# Patient Record
Sex: Female | Born: 1940 | Race: White | Hispanic: No | Marital: Married | Smoking: Never smoker
Health system: Southern US, Community
[De-identification: ages and names within clinical notes are randomized; demographics above are authoritative.]

## PROBLEM LIST (undated history)

## (undated) DIAGNOSIS — F329 Major depressive disorder, single episode, unspecified: Secondary | ICD-10-CM

## (undated) DIAGNOSIS — E119 Type 2 diabetes mellitus without complications: Secondary | ICD-10-CM

## (undated) DIAGNOSIS — Z9289 Personal history of other medical treatment: Secondary | ICD-10-CM

## (undated) DIAGNOSIS — J189 Pneumonia, unspecified organism: Secondary | ICD-10-CM

## (undated) DIAGNOSIS — I5042 Chronic combined systolic (congestive) and diastolic (congestive) heart failure: Secondary | ICD-10-CM

## (undated) DIAGNOSIS — I10 Essential (primary) hypertension: Secondary | ICD-10-CM

## (undated) DIAGNOSIS — F411 Generalized anxiety disorder: Secondary | ICD-10-CM

## (undated) DIAGNOSIS — M1990 Unspecified osteoarthritis, unspecified site: Secondary | ICD-10-CM

## (undated) DIAGNOSIS — B009 Herpesviral infection, unspecified: Secondary | ICD-10-CM

## (undated) DIAGNOSIS — M8580 Other specified disorders of bone density and structure, unspecified site: Secondary | ICD-10-CM

## (undated) DIAGNOSIS — G629 Polyneuropathy, unspecified: Secondary | ICD-10-CM

## (undated) DIAGNOSIS — R0600 Dyspnea, unspecified: Secondary | ICD-10-CM

## (undated) DIAGNOSIS — E669 Obesity, unspecified: Secondary | ICD-10-CM

## (undated) DIAGNOSIS — I491 Atrial premature depolarization: Secondary | ICD-10-CM

## (undated) DIAGNOSIS — J309 Allergic rhinitis, unspecified: Secondary | ICD-10-CM

## (undated) DIAGNOSIS — I420 Dilated cardiomyopathy: Secondary | ICD-10-CM

## (undated) DIAGNOSIS — Z9221 Personal history of antineoplastic chemotherapy: Secondary | ICD-10-CM

## (undated) DIAGNOSIS — R001 Bradycardia, unspecified: Secondary | ICD-10-CM

## (undated) DIAGNOSIS — Z5189 Encounter for other specified aftercare: Secondary | ICD-10-CM

## (undated) DIAGNOSIS — Z923 Personal history of irradiation: Secondary | ICD-10-CM

## (undated) DIAGNOSIS — E785 Hyperlipidemia, unspecified: Secondary | ICD-10-CM

## (undated) DIAGNOSIS — C50319 Malignant neoplasm of lower-inner quadrant of unspecified female breast: Principal | ICD-10-CM

## (undated) DIAGNOSIS — Z8719 Personal history of other diseases of the digestive system: Secondary | ICD-10-CM

## (undated) DIAGNOSIS — K219 Gastro-esophageal reflux disease without esophagitis: Secondary | ICD-10-CM

## (undated) DIAGNOSIS — D229 Melanocytic nevi, unspecified: Secondary | ICD-10-CM

## (undated) HISTORY — DX: Chronic combined systolic (congestive) and diastolic (congestive) heart failure: I50.42

## (undated) HISTORY — DX: Hyperlipidemia, unspecified: E78.5

## (undated) HISTORY — PX: COLONOSCOPY: SHX174

## (undated) HISTORY — DX: Other specified disorders of bone density and structure, unspecified site: M85.80

## (undated) HISTORY — DX: Malignant neoplasm of lower-inner quadrant of unspecified female breast: C50.319

## (undated) HISTORY — DX: Obesity, unspecified: E66.9

## (undated) HISTORY — DX: Allergic rhinitis, unspecified: J30.9

## (undated) HISTORY — DX: Herpesviral infection, unspecified: B00.9

## (undated) HISTORY — DX: Unspecified osteoarthritis, unspecified site: M19.90

## (undated) HISTORY — DX: Personal history of antineoplastic chemotherapy: Z92.21

## (undated) HISTORY — DX: Gastro-esophageal reflux disease without esophagitis: K21.9

## (undated) HISTORY — DX: Bradycardia, unspecified: R00.1

## (undated) HISTORY — DX: Essential (primary) hypertension: I10

## (undated) HISTORY — PX: EYE SURGERY: SHX253

## (undated) HISTORY — DX: Atrial premature depolarization: I49.1

## (undated) HISTORY — DX: Generalized anxiety disorder: F41.1

## (undated) HISTORY — PX: TONSILLECTOMY: SUR1361

## (undated) HISTORY — PX: BREAST SURGERY: SHX581

## (undated) HISTORY — DX: Major depressive disorder, single episode, unspecified: F32.9

---
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
## 1962-02-02 DIAGNOSIS — Z5189 Encounter for other specified aftercare: Secondary | ICD-10-CM

## 1962-02-02 DIAGNOSIS — IMO0001 Reserved for inherently not codable concepts without codable children: Secondary | ICD-10-CM

## 1962-02-02 HISTORY — DX: Reserved for inherently not codable concepts without codable children: IMO0001

## 1962-02-02 HISTORY — DX: Encounter for other specified aftercare: Z51.89

## 1972-02-03 HISTORY — PX: VAGINAL HYSTERECTOMY: SUR661

## 1972-02-03 HISTORY — PX: OTHER SURGICAL HISTORY: SHX169

## 1979-10-18 HISTORY — PX: BREAST EXCISIONAL BIOPSY: SUR124

## 1980-10-09 HISTORY — PX: BREAST EXCISIONAL BIOPSY: SUR124

## 1983-03-04 HISTORY — PX: BREAST EXCISIONAL BIOPSY: SUR124

## 1988-11-10 HISTORY — PX: BREAST EXCISIONAL BIOPSY: SUR124

## 1991-02-03 HISTORY — PX: LAMINECTOMY: SHX219

## 1991-02-23 HISTORY — PX: CHOLECYSTECTOMY: SHX55

## 1994-01-02 HISTORY — PX: BREAST EXCISIONAL BIOPSY: SUR124

## 1998-09-10 ENCOUNTER — Encounter: Admission: RE | Admit: 1998-09-10 | Discharge: 1998-12-09 | Payer: Self-pay | Admitting: Anesthesiology

## 1998-10-14 ENCOUNTER — Other Ambulatory Visit: Admission: RE | Admit: 1998-10-14 | Discharge: 1998-10-14 | Payer: Self-pay | Admitting: Obstetrics and Gynecology

## 1998-12-31 ENCOUNTER — Encounter: Admission: RE | Admit: 1998-12-31 | Discharge: 1998-12-31 | Payer: Self-pay | Admitting: Neurosurgery

## 1998-12-31 ENCOUNTER — Encounter: Payer: Self-pay | Admitting: Neurosurgery

## 2000-02-11 ENCOUNTER — Other Ambulatory Visit: Admission: RE | Admit: 2000-02-11 | Discharge: 2000-02-11 | Payer: Self-pay | Admitting: Gynecology

## 2000-06-01 ENCOUNTER — Encounter: Payer: Self-pay | Admitting: Pulmonary Disease

## 2000-06-01 ENCOUNTER — Ambulatory Visit (HOSPITAL_COMMUNITY): Admission: RE | Admit: 2000-06-01 | Discharge: 2000-06-01 | Payer: Self-pay | Admitting: Pulmonary Disease

## 2001-05-11 ENCOUNTER — Emergency Department (HOSPITAL_COMMUNITY): Admission: EM | Admit: 2001-05-11 | Discharge: 2001-05-11 | Payer: Self-pay | Admitting: *Deleted

## 2001-05-11 ENCOUNTER — Encounter: Payer: Self-pay | Admitting: Emergency Medicine

## 2001-09-02 HISTORY — PX: ROTATOR CUFF REPAIR: SHX139

## 2001-09-09 ENCOUNTER — Observation Stay (HOSPITAL_COMMUNITY): Admission: RE | Admit: 2001-09-09 | Discharge: 2001-09-10 | Payer: Self-pay | Admitting: Specialist

## 2002-10-12 ENCOUNTER — Encounter: Payer: Self-pay | Admitting: Cardiology

## 2002-10-12 ENCOUNTER — Observation Stay (HOSPITAL_COMMUNITY): Admission: EM | Admit: 2002-10-12 | Discharge: 2002-10-13 | Payer: Self-pay | Admitting: Emergency Medicine

## 2002-10-12 ENCOUNTER — Encounter: Payer: Self-pay | Admitting: Emergency Medicine

## 2003-01-05 ENCOUNTER — Emergency Department (HOSPITAL_COMMUNITY): Admission: AD | Admit: 2003-01-05 | Discharge: 2003-01-05 | Payer: Self-pay | Admitting: Family Medicine

## 2003-12-20 ENCOUNTER — Ambulatory Visit: Payer: Self-pay | Admitting: Pulmonary Disease

## 2004-08-07 ENCOUNTER — Ambulatory Visit: Payer: Self-pay | Admitting: Pulmonary Disease

## 2004-09-17 ENCOUNTER — Emergency Department (HOSPITAL_COMMUNITY): Admission: EM | Admit: 2004-09-17 | Discharge: 2004-09-17 | Payer: Self-pay | Admitting: Emergency Medicine

## 2004-10-01 ENCOUNTER — Ambulatory Visit: Payer: Self-pay | Admitting: Pulmonary Disease

## 2004-11-03 ENCOUNTER — Ambulatory Visit: Payer: Self-pay | Admitting: Pulmonary Disease

## 2004-12-15 ENCOUNTER — Ambulatory Visit: Payer: Self-pay | Admitting: Pulmonary Disease

## 2005-02-09 ENCOUNTER — Ambulatory Visit: Payer: Self-pay | Admitting: Pulmonary Disease

## 2005-04-09 ENCOUNTER — Ambulatory Visit: Payer: Self-pay | Admitting: Internal Medicine

## 2005-04-15 ENCOUNTER — Emergency Department (HOSPITAL_COMMUNITY): Admission: EM | Admit: 2005-04-15 | Discharge: 2005-04-15 | Payer: Self-pay | Admitting: Emergency Medicine

## 2005-05-18 ENCOUNTER — Ambulatory Visit: Payer: Self-pay | Admitting: Pulmonary Disease

## 2006-06-01 ENCOUNTER — Ambulatory Visit: Payer: Self-pay | Admitting: Pulmonary Disease

## 2006-06-01 LAB — CONVERTED CEMR LAB
Basophils Absolute: 0 10*3/uL (ref 0.0–0.1)
Basophils Relative: 0.5 % (ref 0.0–1.0)
CO2: 30 meq/L (ref 19–32)
Calcium: 9.8 mg/dL (ref 8.4–10.5)
Direct LDL: 192.9 mg/dL
Eosinophils Absolute: 0.1 10*3/uL (ref 0.0–0.6)
Eosinophils Relative: 2 % (ref 0.0–5.0)
GFR calc Af Amer: 93 mL/min
Glucose, Bld: 112 mg/dL — ABNORMAL HIGH (ref 70–99)
Hemoglobin: 15.4 g/dL — ABNORMAL HIGH (ref 12.0–15.0)
Hgb A1c MFr Bld: 6.1 % — ABNORMAL HIGH (ref 4.6–6.0)
Lymphocytes Relative: 38.7 % (ref 12.0–46.0)
MCHC: 34.6 g/dL (ref 30.0–36.0)
MCV: 87 fL (ref 78.0–100.0)
Monocytes Relative: 6.1 % (ref 3.0–11.0)
Neutro Abs: 3.9 10*3/uL (ref 1.4–7.7)
RDW: 13 % (ref 11.5–14.6)
Sodium: 143 meq/L (ref 135–145)
Total Protein: 7.1 g/dL (ref 6.0–8.3)

## 2006-07-04 HISTORY — PX: ENTEROCELE REPAIR: SHX623

## 2006-07-07 ENCOUNTER — Ambulatory Visit (HOSPITAL_COMMUNITY): Admission: RE | Admit: 2006-07-07 | Discharge: 2006-07-08 | Payer: Self-pay | Admitting: Obstetrics and Gynecology

## 2006-09-28 ENCOUNTER — Ambulatory Visit: Payer: Self-pay | Admitting: Pulmonary Disease

## 2006-12-14 ENCOUNTER — Ambulatory Visit: Payer: Self-pay | Admitting: Pulmonary Disease

## 2006-12-17 DIAGNOSIS — K219 Gastro-esophageal reflux disease without esophagitis: Secondary | ICD-10-CM

## 2006-12-17 DIAGNOSIS — M1990 Unspecified osteoarthritis, unspecified site: Secondary | ICD-10-CM | POA: Insufficient documentation

## 2006-12-17 DIAGNOSIS — E7800 Pure hypercholesterolemia, unspecified: Secondary | ICD-10-CM

## 2006-12-17 DIAGNOSIS — I10 Essential (primary) hypertension: Secondary | ICD-10-CM

## 2006-12-17 HISTORY — DX: Unspecified osteoarthritis, unspecified site: M19.90

## 2006-12-17 HISTORY — DX: Gastro-esophageal reflux disease without esophagitis: K21.9

## 2006-12-28 ENCOUNTER — Telehealth (INDEPENDENT_AMBULATORY_CARE_PROVIDER_SITE_OTHER): Payer: Self-pay | Admitting: *Deleted

## 2006-12-29 ENCOUNTER — Ambulatory Visit: Payer: Self-pay | Admitting: Pulmonary Disease

## 2006-12-29 LAB — CONVERTED CEMR LAB
ALT: 39 units/L — ABNORMAL HIGH (ref 0–35)
Alkaline Phosphatase: 99 units/L (ref 39–117)
Calcium: 9.9 mg/dL (ref 8.4–10.5)
Cholesterol: 252 mg/dL (ref 0–200)
Direct LDL: 163.1 mg/dL
Eosinophils Absolute: 0.2 10*3/uL (ref 0.0–0.6)
Eosinophils Relative: 3 % (ref 0.0–5.0)
GFR calc Af Amer: 108 mL/min
GFR calc non Af Amer: 89 mL/min
Glucose, Bld: 129 mg/dL — ABNORMAL HIGH (ref 70–99)
HCT: 43 % (ref 36.0–46.0)
Hgb A1c MFr Bld: 6.6 % — ABNORMAL HIGH (ref 4.6–6.0)
MCHC: 35.5 g/dL (ref 30.0–36.0)
Monocytes Relative: 7.8 % (ref 3.0–11.0)
Neutro Abs: 3.8 10*3/uL (ref 1.4–7.7)
Neutrophils Relative %: 54.7 % (ref 43.0–77.0)
RBC: 4.96 M/uL (ref 3.87–5.11)
RDW: 13.5 % (ref 11.5–14.6)
Sodium: 140 meq/L (ref 135–145)
TSH: 1 microintl units/mL (ref 0.35–5.50)
Total CHOL/HDL Ratio: 5.1
WBC: 6.9 10*3/uL (ref 4.5–10.5)

## 2007-01-10 ENCOUNTER — Telehealth: Payer: Self-pay | Admitting: Pulmonary Disease

## 2007-01-12 ENCOUNTER — Telehealth (INDEPENDENT_AMBULATORY_CARE_PROVIDER_SITE_OTHER): Payer: Self-pay | Admitting: *Deleted

## 2007-02-28 ENCOUNTER — Ambulatory Visit: Payer: Self-pay | Admitting: Pulmonary Disease

## 2007-03-31 ENCOUNTER — Telehealth: Payer: Self-pay | Admitting: Pulmonary Disease

## 2007-04-11 ENCOUNTER — Telehealth (INDEPENDENT_AMBULATORY_CARE_PROVIDER_SITE_OTHER): Payer: Self-pay | Admitting: *Deleted

## 2007-05-18 ENCOUNTER — Encounter: Payer: Self-pay | Admitting: Pulmonary Disease

## 2007-08-10 ENCOUNTER — Ambulatory Visit: Payer: Self-pay | Admitting: Pulmonary Disease

## 2007-08-10 ENCOUNTER — Encounter: Payer: Self-pay | Admitting: Adult Health

## 2007-08-10 DIAGNOSIS — F329 Major depressive disorder, single episode, unspecified: Secondary | ICD-10-CM

## 2007-08-10 DIAGNOSIS — E119 Type 2 diabetes mellitus without complications: Secondary | ICD-10-CM

## 2007-08-10 DIAGNOSIS — F3289 Other specified depressive episodes: Secondary | ICD-10-CM

## 2007-08-10 HISTORY — DX: Major depressive disorder, single episode, unspecified: F32.9

## 2007-08-10 HISTORY — DX: Type 2 diabetes mellitus without complications: E11.9

## 2007-08-10 HISTORY — DX: Other specified depressive episodes: F32.89

## 2007-08-11 LAB — CONVERTED CEMR LAB
AST: 25 units/L (ref 0–37)
Albumin: 3.8 g/dL (ref 3.5–5.2)
Basophils Absolute: 0.1 10*3/uL (ref 0.0–0.1)
CO2: 32 meq/L (ref 19–32)
Calcium: 9.7 mg/dL (ref 8.4–10.5)
Chloride: 103 meq/L (ref 96–112)
Cholesterol: 212 mg/dL (ref 0–200)
Direct LDL: 138.1 mg/dL
Eosinophils Absolute: 0.1 10*3/uL (ref 0.0–0.7)
Eosinophils Relative: 2.2 % (ref 0.0–5.0)
Glucose, Bld: 145 mg/dL — ABNORMAL HIGH (ref 70–99)
Hemoglobin: 14.5 g/dL (ref 12.0–15.0)
Hgb A1c MFr Bld: 6.5 % — ABNORMAL HIGH (ref 4.6–6.0)
Lymphocytes Relative: 29.4 % (ref 12.0–46.0)
MCV: 89.2 fL (ref 78.0–100.0)
Monocytes Absolute: 0.5 10*3/uL (ref 0.1–1.0)
Monocytes Relative: 10 % (ref 3.0–12.0)
Neutro Abs: 3.1 10*3/uL (ref 1.4–7.7)
Neutrophils Relative %: 57.4 % (ref 43.0–77.0)
Sodium: 145 meq/L (ref 135–145)
TSH: 1.43 microintl units/mL (ref 0.35–5.50)
Total CHOL/HDL Ratio: 4.4
Triglycerides: 166 mg/dL — ABNORMAL HIGH (ref 0–149)
VLDL: 33 mg/dL (ref 0–40)

## 2007-08-22 ENCOUNTER — Telehealth: Payer: Self-pay | Admitting: Pulmonary Disease

## 2007-09-21 ENCOUNTER — Ambulatory Visit: Payer: Self-pay | Admitting: Pulmonary Disease

## 2007-09-22 ENCOUNTER — Ambulatory Visit: Payer: Self-pay

## 2007-09-22 ENCOUNTER — Encounter: Payer: Self-pay | Admitting: Pulmonary Disease

## 2007-09-22 ENCOUNTER — Encounter: Payer: Self-pay | Admitting: Adult Health

## 2007-09-22 ENCOUNTER — Telehealth (INDEPENDENT_AMBULATORY_CARE_PROVIDER_SITE_OTHER): Payer: Self-pay | Admitting: *Deleted

## 2007-10-05 ENCOUNTER — Telehealth: Payer: Self-pay | Admitting: Adult Health

## 2007-11-07 ENCOUNTER — Ambulatory Visit: Admission: RE | Admit: 2007-11-07 | Discharge: 2007-11-07 | Payer: Self-pay | Admitting: Family Medicine

## 2007-11-07 ENCOUNTER — Ambulatory Visit: Payer: Self-pay | Admitting: Vascular Surgery

## 2007-11-07 ENCOUNTER — Encounter (INDEPENDENT_AMBULATORY_CARE_PROVIDER_SITE_OTHER): Payer: Self-pay | Admitting: Family Medicine

## 2007-11-10 ENCOUNTER — Encounter: Payer: Self-pay | Admitting: Adult Health

## 2007-11-10 ENCOUNTER — Ambulatory Visit: Payer: Self-pay | Admitting: Pulmonary Disease

## 2007-11-10 LAB — CONVERTED CEMR LAB: Vit D, 1,25-Dihydroxy: 27 — ABNORMAL LOW (ref 30–89)

## 2007-11-14 LAB — CONVERTED CEMR LAB
ALT: 32 units/L (ref 0–35)
Alkaline Phosphatase: 96 units/L (ref 39–117)
BUN: 15 mg/dL (ref 6–23)
Bilirubin, Direct: 0.1 mg/dL (ref 0.0–0.3)
CO2: 33 meq/L — ABNORMAL HIGH (ref 19–32)
Calcium: 9.2 mg/dL (ref 8.4–10.5)
Chloride: 102 meq/L (ref 96–112)
Cholesterol: 212 mg/dL (ref 0–200)
Creatinine, Ser: 0.6 mg/dL (ref 0.4–1.2)
GFR calc Af Amer: 128 mL/min
Glucose, Bld: 109 mg/dL — ABNORMAL HIGH (ref 70–99)
Hgb A1c MFr Bld: 6.7 % — ABNORMAL HIGH (ref 4.6–6.0)
Sodium: 141 meq/L (ref 135–145)
Total Bilirubin: 0.6 mg/dL (ref 0.3–1.2)
Total CHOL/HDL Ratio: 3.3
Total Protein: 6.9 g/dL (ref 6.0–8.3)

## 2007-11-29 ENCOUNTER — Telehealth: Payer: Self-pay | Admitting: Pulmonary Disease

## 2007-12-12 ENCOUNTER — Ambulatory Visit: Payer: Self-pay | Admitting: Pulmonary Disease

## 2007-12-13 DIAGNOSIS — J309 Allergic rhinitis, unspecified: Secondary | ICD-10-CM

## 2007-12-13 HISTORY — DX: Allergic rhinitis, unspecified: J30.9

## 2007-12-27 ENCOUNTER — Encounter: Payer: Self-pay | Admitting: Pulmonary Disease

## 2008-01-03 ENCOUNTER — Telehealth (INDEPENDENT_AMBULATORY_CARE_PROVIDER_SITE_OTHER): Payer: Self-pay | Admitting: *Deleted

## 2008-01-05 ENCOUNTER — Telehealth (INDEPENDENT_AMBULATORY_CARE_PROVIDER_SITE_OTHER): Payer: Self-pay | Admitting: *Deleted

## 2008-01-09 ENCOUNTER — Telehealth: Payer: Self-pay | Admitting: Pulmonary Disease

## 2008-01-17 ENCOUNTER — Ambulatory Visit: Payer: Self-pay | Admitting: Pulmonary Disease

## 2008-03-06 ENCOUNTER — Telehealth (INDEPENDENT_AMBULATORY_CARE_PROVIDER_SITE_OTHER): Payer: Self-pay | Admitting: *Deleted

## 2008-05-18 ENCOUNTER — Telehealth: Payer: Self-pay | Admitting: Pulmonary Disease

## 2008-05-30 ENCOUNTER — Telehealth: Payer: Self-pay | Admitting: Pulmonary Disease

## 2008-05-31 ENCOUNTER — Ambulatory Visit: Payer: Self-pay | Admitting: Pulmonary Disease

## 2008-06-04 ENCOUNTER — Ambulatory Visit: Payer: Self-pay | Admitting: Pulmonary Disease

## 2008-06-04 DIAGNOSIS — R0789 Other chest pain: Secondary | ICD-10-CM | POA: Insufficient documentation

## 2008-06-04 DIAGNOSIS — E669 Obesity, unspecified: Secondary | ICD-10-CM

## 2008-06-04 DIAGNOSIS — M545 Low back pain: Secondary | ICD-10-CM

## 2008-06-04 DIAGNOSIS — B009 Herpesviral infection, unspecified: Secondary | ICD-10-CM

## 2008-06-04 DIAGNOSIS — F411 Generalized anxiety disorder: Secondary | ICD-10-CM | POA: Insufficient documentation

## 2008-06-04 DIAGNOSIS — R32 Unspecified urinary incontinence: Secondary | ICD-10-CM | POA: Insufficient documentation

## 2008-06-04 DIAGNOSIS — J209 Acute bronchitis, unspecified: Secondary | ICD-10-CM

## 2008-06-04 HISTORY — DX: Herpesviral infection, unspecified: B00.9

## 2008-06-04 HISTORY — DX: Generalized anxiety disorder: F41.1

## 2008-06-04 HISTORY — DX: Obesity, unspecified: E66.9

## 2008-06-04 LAB — CONVERTED CEMR LAB
ALT: 35 units/L (ref 0–35)
AST: 19 units/L (ref 0–37)
Albumin: 3.6 g/dL (ref 3.5–5.2)
Alkaline Phosphatase: 108 units/L (ref 39–117)
Basophils Absolute: 0.1 10*3/uL (ref 0.0–0.1)
Basophils Relative: 0.7 % (ref 0.0–3.0)
Bilirubin, Direct: 0.1 mg/dL (ref 0.0–0.3)
Creatinine,U: 197.9 mg/dL
Eosinophils Absolute: 0.1 10*3/uL (ref 0.0–0.7)
GFR calc non Af Amer: 75.85 mL/min (ref >60)
Glucose, Bld: 127 mg/dL — ABNORMAL HIGH (ref 70–99)
Hgb A1c MFr Bld: 7.3 % — ABNORMAL HIGH (ref 4.6–6.5)
Lymphocytes Relative: 30.2 % (ref 12.0–46.0)
Lymphs Abs: 2.5 10*3/uL (ref 0.7–4.0)
MCHC: 34.2 g/dL (ref 30.0–36.0)
Monocytes Absolute: 0.6 10*3/uL (ref 0.1–1.0)
Monocytes Relative: 6.8 % (ref 3.0–12.0)
Neutrophils Relative %: 61 % (ref 43.0–77.0)
Nitrite: NEGATIVE
Platelets: 237 10*3/uL (ref 150.0–400.0)
Potassium: 4.3 meq/L (ref 3.5–5.1)
Sodium: 144 meq/L (ref 135–145)
Total Bilirubin: 1 mg/dL (ref 0.3–1.2)
Total CHOL/HDL Ratio: 3
Urine Glucose: NEGATIVE mg/dL
VLDL: 19 mg/dL (ref 0.0–40.0)
WBC: 8.3 10*3/uL (ref 4.5–10.5)
pH: 5.5 (ref 5.0–8.0)

## 2008-07-03 ENCOUNTER — Ambulatory Visit: Payer: Self-pay | Admitting: Pulmonary Disease

## 2008-07-03 ENCOUNTER — Telehealth: Payer: Self-pay | Admitting: Pulmonary Disease

## 2008-07-03 LAB — CONVERTED CEMR LAB
Ketones, ur: NEGATIVE mg/dL
Nitrite: POSITIVE
Total Protein, Urine: >=300 mg/dL
Urine Glucose: 100 mg/dL
Urobilinogen, UA: 4 (ref 0.0–1.0)

## 2008-09-04 ENCOUNTER — Ambulatory Visit: Payer: Self-pay | Admitting: Internal Medicine

## 2008-09-04 LAB — CONVERTED CEMR LAB
AST: 31 units/L (ref 0–37)
Alkaline Phosphatase: 100 units/L (ref 39–117)
BUN: 12 mg/dL (ref 6–23)
CO2: 27 meq/L (ref 19–32)
Cholesterol: 250 mg/dL — ABNORMAL HIGH (ref 0–200)
Direct LDL: 172.3 mg/dL
Glucose, Bld: 154 mg/dL — ABNORMAL HIGH (ref 70–99)
HDL: 46.6 mg/dL (ref >39.00)
Hgb A1c MFr Bld: 7.1 % — ABNORMAL HIGH (ref 4.6–6.5)
Potassium: 3.7 meq/L (ref 3.5–5.1)
Sodium: 144 meq/L (ref 135–145)
Total Protein: 6.8 g/dL (ref 6.0–8.3)
Triglycerides: 175 mg/dL — ABNORMAL HIGH (ref 0.0–149.0)

## 2008-09-05 ENCOUNTER — Telehealth (INDEPENDENT_AMBULATORY_CARE_PROVIDER_SITE_OTHER): Payer: Self-pay | Admitting: *Deleted

## 2008-09-07 ENCOUNTER — Encounter: Payer: Self-pay | Admitting: Pulmonary Disease

## 2008-09-11 ENCOUNTER — Encounter: Payer: Self-pay | Admitting: Pulmonary Disease

## 2008-10-24 ENCOUNTER — Telehealth (INDEPENDENT_AMBULATORY_CARE_PROVIDER_SITE_OTHER): Payer: Self-pay | Admitting: *Deleted

## 2008-10-26 ENCOUNTER — Ambulatory Visit (HOSPITAL_COMMUNITY): Admission: RE | Admit: 2008-10-26 | Discharge: 2008-10-27 | Payer: Self-pay | Admitting: Orthopaedic Surgery

## 2008-10-26 HISTORY — PX: ROTATOR CUFF REPAIR: SHX139

## 2008-11-05 ENCOUNTER — Encounter: Admission: RE | Admit: 2008-11-05 | Discharge: 2008-12-18 | Payer: Self-pay | Admitting: Orthopaedic Surgery

## 2008-11-08 ENCOUNTER — Ambulatory Visit: Payer: Self-pay | Admitting: Pulmonary Disease

## 2008-12-05 ENCOUNTER — Ambulatory Visit: Payer: Self-pay | Admitting: Pulmonary Disease

## 2009-02-12 ENCOUNTER — Encounter: Payer: Self-pay | Admitting: Pulmonary Disease

## 2009-02-13 ENCOUNTER — Telehealth: Payer: Self-pay | Admitting: Pulmonary Disease

## 2009-04-08 ENCOUNTER — Encounter: Payer: Self-pay | Admitting: Pulmonary Disease

## 2009-04-16 ENCOUNTER — Telehealth (INDEPENDENT_AMBULATORY_CARE_PROVIDER_SITE_OTHER): Payer: Self-pay | Admitting: *Deleted

## 2009-05-17 ENCOUNTER — Telehealth (INDEPENDENT_AMBULATORY_CARE_PROVIDER_SITE_OTHER): Payer: Self-pay | Admitting: *Deleted

## 2009-06-14 ENCOUNTER — Ambulatory Visit: Payer: Self-pay | Admitting: Endocrinology

## 2009-06-24 ENCOUNTER — Encounter: Payer: Self-pay | Admitting: Endocrinology

## 2009-07-08 ENCOUNTER — Ambulatory Visit: Payer: Self-pay | Admitting: Endocrinology

## 2009-08-02 ENCOUNTER — Telehealth: Payer: Self-pay | Admitting: Pulmonary Disease

## 2009-09-05 ENCOUNTER — Encounter: Payer: Self-pay | Admitting: Pulmonary Disease

## 2009-12-31 ENCOUNTER — Telehealth (INDEPENDENT_AMBULATORY_CARE_PROVIDER_SITE_OTHER): Payer: Self-pay | Admitting: *Deleted

## 2010-01-07 ENCOUNTER — Telehealth: Payer: Self-pay | Admitting: Pulmonary Disease

## 2010-01-14 ENCOUNTER — Ambulatory Visit: Payer: Self-pay | Admitting: Pulmonary Disease

## 2010-01-14 LAB — CONVERTED CEMR LAB
BUN: 14 mg/dL (ref 6–23)
CO2: 31 meq/L (ref 19–32)
Chloride: 101 meq/L (ref 96–112)
Glucose, Bld: 198 mg/dL — ABNORMAL HIGH (ref 70–99)
Hgb A1c MFr Bld: 8.1 % — ABNORMAL HIGH (ref 4.6–6.5)
Sodium: 141 meq/L (ref 135–145)

## 2010-02-27 ENCOUNTER — Ambulatory Visit: Admit: 2010-02-27 | Payer: Self-pay | Admitting: Pulmonary Disease

## 2010-02-27 ENCOUNTER — Telehealth: Payer: Self-pay | Admitting: Pulmonary Disease

## 2010-02-28 ENCOUNTER — Telehealth: Payer: Self-pay | Admitting: Pulmonary Disease

## 2010-03-04 ENCOUNTER — Telehealth (INDEPENDENT_AMBULATORY_CARE_PROVIDER_SITE_OTHER): Payer: Self-pay | Admitting: *Deleted

## 2010-03-04 NOTE — Assessment & Plan Note (Signed)
Summary: crud, coughing wheezing/apc   Chief Complaint:  prod cough with yellow sputum, wheezing, and increased SOB x2 weeks - has finished round of augmentin.  History of Present Illness: 70 year old female patient has a known history of DM,  hypertension and hyperlipidemia    September 21, 2007 --Presents for left leg redness and swelling. Stepped onto chair and fell backward 2 weeks, fell hard onto floor onto left hip, scraped left shin, now red swollen and tender. Feels very hot, and throbs. neg venous doppler for DVT. tx Augmentin x 10 days (multi ab allergies).   November 10, 2007 --follow up. recent flare in hip pain, cortisone injection in both hips by Dr. Carolyn Stare).  Doing well until this AM with nasal congestion, drainage, sore throat. Tolerating meds well, no problems.  --labs showed good cholestrol control, A1C 6.7.   December 12, 2007--Presents for 2 weeks of left earache, nasal drip, pain along left side of neck, sore to touch. Took zpack 1 week ago, no better. 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 Has hx of recurrent breakouts. Uses valtrex as needed.  --dx with neck strain-rx nsaids, and dx -AR-symptomatic tx.   January 17, 2008 -returns for productive cough, congestion with thick yellow mucus, wheezing. malaise. Took Augmentin on 01/03/08. Symtoms still going on. Sinus pain/pressure. Denies chest pain, dyspnea, orthopnea, hemoptysis, fever, n/v/d, edema.      Prior Medications Reviewed Using: Patient Recall  Updated Prior Medication List: ALBUTEROL SULFATE (2.5 MG/3ML) 0.083%  NEBU (ALBUTEROL SULFATE) as needed BENAZEPRIL HCL 20 MG  TABS (BENAZEPRIL HCL) Take 1 tablet by mouth once a day SIMVASTATIN 80 MG  TABS (SIMVASTATIN) Take 1 tab by mouth at bedtime OMEPRAZOLE 20 MG  CPDR (OMEPRAZOLE) 1 by mouth once daily GLIMEPIRIDE 2 MG  TABS (GLIMEPIRIDE) Take 1 tablet by mouth every morning AMLODIPINE BESYLATE 5 MG  TABS (AMLODIPINE BESYLATE) Take 1 tablet by  mouth once a day XANAX 0.5 MG  TABS (ALPRAZOLAM) 1/2 - 1 by mouth three times a day as needed nerves ZOLOFT 50 MG  TABS (SERTRALINE HCL) 1 by mouth once daily VITAMIN D 2000 UNIT TABS (CHOLECALCIFEROL) Take 1 tablet by mouth once a day HYDROCHLOROTHIAZIDE 25 MG  TABS (HYDROCHLOROTHIAZIDE) Take 1 tablet by mouth once a day KLOR-CON M20 20 MEQ  CR-TABS (POTASSIUM CHLORIDE CRYS CR) Take 1 tablet by mouth once a day VALTREX 500 MG TABS (VALACYCLOVIR HCL) 1 by mouth two times a day as needed outbreak  Current Allergies (reviewed today): ! ROCEPHIN ! CLEOCIN ! SULFA ! KEFLEX ! 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 Gwenith Daily (ICD-272.0) on simvastatin 80mg  , 11/08 TC 252/HDL 49/LDL163/              GERD (ICD-530.81) controlled on zantac 150mg  at bedtime , last EGD 11/04 hiatal hernia, esophageal stricture, and benign nodule f/b Russella Dar. Normal Colon 2004-[place], 2008 Dr. Loreta Ave ? results. per pt normal.               HYPERTENSION (ICD-401.9) controlled on benezapril 20mg / amlodipine 5mg               Depression restarted on zoloft 50mg   08/10/07              HSV -2               s/p laminectomy 1993-[place]       s/p hysterectomy-1974       Bladder tack-1974       s/p cholecystectomy       s/p tonsillectomy           Family History:    Reviewed history from 09/21/2007 and no changes required:       mother died at 47 from copd, also hasd arthritis, emphysema       father died at 63 from brain tumor       sibling 1 died at 14, heart disease, diabetes       sibling 2 living with back problems, allergies       family history of prostate cancer  Social History:    Reviewed history from 09/21/2007 and no changes required:       never smoked       positive for second-hand smoke  exposure       no exercise       no caffeine       married       3 children   Risk Factors: Tobacco use:  never   Review of Systems      See HPI   Vital Signs:  Patient Profile:   70 Years Old Female Weight:      218.13 pounds O2 Sat:      97 % O2 treatment:    Room Air Temp:     98.5 degrees F oral Pulse rate:   77 / minute BP sitting:   142 / 84  (left arm) Cuff size:   regular  Vitals Entered By: Boone Master CNA (January 17, 2008 11:17 AM)             Is Patient Diabetic? No Comments Medications reviewed with patient Boone Master CNA  January 17, 2008 11:19 AM      Physical Exam  GENERAL:  A/Ox3; pleasant & cooperative.NAD HEENT:  [place]/AT, EOM-wnl, PERRLA, EACs-clear, no redness, TMs-wnl, NOSE-pale max tenderness , THROAT-clear & wnl. NECK:  Supple w/ fair ROM; no JVD; normal carotid impulses w/o bruits; no thyromegaly or nodules palpated; no lymphadenopathy.  CHEST:  Coarse  BS with faint exp wheeze.  HEART:  RRR, no m/r/g  heard ABDOMEN:  Soft & nt; nml bowel sounds; no organomegaly or masses detected. EXT: Warm bilat,  no calf pain, tr. edema, clubbing, pulses intact        Impression & Recommendations:  Problem # 1:  BRONCHITIS, ACUTE (ICD-466.0) Slow to resolve REC: Levaquijn 750mg  once daily for 5 days Mucinex DM two times a day as needed cough/congestion Prednisone taper over next week. (monitor for blood sugars, call for bs >250) Please contact office for sooner follow up if symptoms do not improve or worsen  Increase Fluids.   Her updated medication list for this problem includes:    Albuterol Sulfate (2.5 Mg/5ml) 0.083% Nebu (Albuterol sulfate) .Marland Kitchen... As needed  Take antibiotics and other medications as directed. Encouraged to push clear liquids, get enough rest, and take acetaminophen as needed. To be seen in 5-7 days if no improvement, sooner if worse.  Orders: Est. Patient Level IV (63875)   Medications Added to Medication List  This Visit: 1)  Vitamin D 2000 Unit Tabs (Cholecalciferol) .... Take 1 tablet by mouth once  a day 2)  Prednisone 10 Mg Tabs (Prednisone) .... 4 tabs for 2 days, then 3 tabs for 2 days, 2 tabs for 2 days, then 1 tab for 2 days, then stop  Complete Medication List: 1)  Albuterol Sulfate (2.5 Mg/60ml) 0.083% Nebu (Albuterol sulfate) .... As needed 2)  Benazepril Hcl 20 Mg Tabs (Benazepril hcl) .... Take 1 tablet by mouth once a day 3)  Simvastatin 80 Mg Tabs (Simvastatin) .... Take 1 tab by mouth at bedtime 4)  Omeprazole 20 Mg Cpdr (Omeprazole) .Marland Kitchen.. 1 by mouth once daily 5)  Glimepiride 2 Mg Tabs (Glimepiride) .... Take 1 tablet by mouth every morning 6)  Amlodipine Besylate 5 Mg Tabs (Amlodipine besylate) .... Take 1 tablet by mouth once a day 7)  Xanax 0.5 Mg Tabs (Alprazolam) .... 1/2 - 1 by mouth three times a day as needed nerves 8)  Zoloft 50 Mg Tabs (Sertraline hcl) .Marland Kitchen.. 1 by mouth once daily 9)  Vitamin D 2000 Unit Tabs (Cholecalciferol) .... Take 1 tablet by mouth once a day 10)  Hydrochlorothiazide 25 Mg Tabs (Hydrochlorothiazide) .... Take 1 tablet by mouth once a day 11)  Klor-con M20 20 Meq Cr-tabs (Potassium chloride crys cr) .... Take 1 tablet by mouth once a day 12)  Valtrex 500 Mg Tabs (Valacyclovir hcl) .Marland Kitchen.. 1 by mouth two times a day as needed outbreak 13)  Prednisone 10 Mg Tabs (Prednisone) .... 4 tabs for 2 days, then 3 tabs for 2 days, 2 tabs for 2 days, then 1 tab for 2 days, then stop   Patient Instructions: 1)  Levaquijn 750mg  once daily for 5 days 2)  Mucinex DM two times a day as needed cough/congestion 3)  Prednisone taper over next week.  4)  Please contact office for sooner follow up if symptoms do not improve or worsen  5)  Increase Fluids.    Prescriptions: PREDNISONE 10 MG TABS (PREDNISONE) 4 tabs for 2 days, then 3 tabs for 2 days, 2 tabs for 2 days, then 1 tab for 2 days, then stop  #20 x 0   Entered and Authorized by:   Rubye Oaks NP   Signed by:    Rubye Oaks NP on 01/17/2008   Method used:   Electronically to        [facility]  [phone]* (retail)       [address]       [place], [place]  [address]       Ph: [phone] or [phone]       Fax: [phone]   RxID:   [id number]  ]

## 2010-03-04 NOTE — Progress Notes (Signed)
Summary: rx for UTI  Phone Note Call from Patient Call back at [phone]   Caller: Patient Call For: nadel Reason for Call: Talk to Nurse Summary of Call: pt has UTI, can you call her something in? [facility] Initial call taken by: Eugene Gavia,  April 16, 2009 9:33 AM  Follow-up for Phone Call        called and spoke with pt.  pt believes she has a UTI.  Pt c/o burning while urinating, frequency, urgency, low back pain, and urine smells "bad."  Pt denied fever.  Please advise.  Thanks.  Aundra Millet Reynolds LPN  April 16, 2009 10:02 AM   ALLERGIES:  Rocephin, Cleocin, Sulfa, Keflex, Percocet  Additional Follow-up for Phone Call Additional follow up Details #1::        per SN---ok for pt to have cipro  250mg   #14   1 by mouth two times a day until gone.  thanks Randell Loop CMA  April 16, 2009 11:55 AM     Additional Follow-up for Phone Call Additional follow up Details #2::    called and spoke with pt.  pt aware rx sent to pharmacy.  Aundra Millet Reynolds LPN  April 16, 2009 11:59 AM   New/Updated Medications: CIPRO 250 MG TABS (CIPROFLOXACIN HCL) Take 1 tablet by mouth two times a day until gone. Prescriptions: CIPRO 250 MG TABS (CIPROFLOXACIN HCL) Take 1 tablet by mouth two times a day until gone.  #14 x 0   Entered by:   Arman Filter LPN   Authorized by:   Michele Mcalpine MD   Signed by:   Arman Filter LPN on 43/32/9518   Method used:   Electronically to        [facility]  [phone]* (retail)       [address]       [place], [place]  [address]       Ph: [phone] or [phone]       Fax: [phone]   RxID:   [id number]

## 2010-03-04 NOTE — Medication Information (Signed)
Summary: Glucose Supplies/[facility]  [facility]ucose [facility]   Imported By: Sherian Rein 09/10/2009 14:52:12  _____________________________________________________________________  External Attachment:    Type:   Image     Comment:   External Document

## 2010-03-04 NOTE — Progress Notes (Signed)
Summary: [facility]  Phone Note Call from Patient Call back at [phone] cell   Caller: Patient Call For: nadel Reason for Call: Talk to Nurse Summary of Call: Pt exposed to [facility].  Dressed wound that is MRCR.  Please advise. Initial call taken by: Eugene Gavia,  August 02, 2009 8:08 AM  Follow-up for Phone Call        called and spoke with pt--her friend had a boil come up on his neck--she had been dressing this area for him--it was positive for MRSA---she is wondering if she will need an antibiotic.  she is very worried about this.  please advise.  thanks Randell Loop CMA  August 02, 2009 9:14 AM   Additional Follow-up for Phone Call Additional follow up Details #1::        called and spoke with pt---per TP    use anti-bacterial soap, the hand sanitizer--look for any symptoms and call for any problems.  pt voiced her understanding and will call for any problems Randell Loop CMA  August 02, 2009 12:47 PM

## 2010-03-04 NOTE — Assessment & Plan Note (Signed)
Summary: new pt/diabetes/[facility]/bs 369-lb   Vital Signs:  Patient profile:   70 year old female Height:      68 inches (172.72 cm) Weight:      203.38 pounds (92.45 kg) O2 Sat:      95 % on Room air Temp:     97.5 degrees F (36.39 degrees C) oral Pulse rate:   91 / minute BP sitting:   164 / 104  (left arm) Cuff size:   regular  Vitals Entered By: Josph Macho RMA (Jun 14, 2009 3:03 PM)  O2 Flow:  Room air CC: New Endo: Diabetes/ pt states she is no longer taking Cipro/ CF Is Patient Diabetic? Yes   Primary Provider:  nadel   CC:  New Endo: Diabetes/ pt states she is no longer taking Cipro/ CF.  History of Present Illness: pt states 5 years h/o dm.  she is unaware of any chronic complications.  she was given insulin only in the hospital for shoulder surgery.  she takes metformin and glipizide.  she says her cbg's were well-controlled in 2010, but it has since increased:  no cbg record, but states cbg's vary from 200-369. pt says her diet is "good," and exercise is "poor."   symptomatically, pt states pt states few mos of moderate fatigue, but no numbness of the feet.  she has associated headache.   Current Medications (verified): 1)  Albuterol Sulfate (2.5 Mg/82ml) 0.083%  Nebu (Albuterol Sulfate) .... As Needed 2)  Klor-Con M20 20 Meq  Cr-Tabs (Potassium Chloride Crys Cr) .... Take 1 Tablet By Mouth Once A Day 3)  Simvastatin 80 Mg  Tabs (Simvastatin) .... Take 1 Tab By Mouth At Bedtime 4)  Metformin Hcl 500 Mg Tabs (Metformin Hcl) .... Take One Tablet By Mouth Two Times A Day 5)  Omeprazole 20 Mg  Cpdr (Omeprazole) .Marland Kitchen.. 1 By Mouth Once Daily 6)  Xanax 0.5 Mg  Tabs (Alprazolam) .... 1/2 - 1 By Mouth Three Times A Day As Needed Nerves 7)  Zoloft 50 Mg  Tabs (Sertraline Hcl) .Marland Kitchen.. 1 By Mouth Once Daily 8)  Vitamin D 2000 Unit Tabs (Cholecalciferol) .... Take 1 Tablet By Mouth Once A Day 9)  Glimepiride 2 Mg Tabs (Glimepiride) .... 1/2 Once Daily 10)  Amlodipine  Besylate 5 Mg Tabs (Amlodipine Besylate) .... 2 By Mouth Once Daily 11)  Benazepril Hcl 20 Mg Tabs (Benazepril Hcl) .Marland Kitchen.. 1 By Mouth Once Daily 12)  Hydrochlorothiazide 25 Mg Tabs (Hydrochlorothiazide) .Marland Kitchen.. 1 By Mouth Once Daily As Needed 13)  Cipro 250 Mg Tabs (Ciprofloxacin Hcl) .... Take 1 Tablet By Mouth Two Times A Day Until Gone.  Allergies (verified): 1)  ! Rocephin 2)  ! Cleocin 3)  ! Sulfa 4)  ! Keflex 5)  ! Percocet  Past History:  Past Medical History: Last updated: 12/05/2008 ALLERGIC RHINITIS (ICD-477.9) - uses OTC meds Prn...  Hx of ASTHMATIC BRONCHITIS, ACUTE (ICD-466.0) - uses NEB w/ Albuterol Prn...  HYPERTENSION (ICD-401.9) - currently on AMLODIPINE 10mg /d, BENAZEPRIL 20mg /d, HCTZ 25mg /d, KCl 90mEg/d... she admits taking meds irreg & currently not taking the Hct daily...   ~  5/10:change to [facility] HCT- 10/160/12.5 daily + KCl 1/d.  Hx of CHEST PAIN, ATYPICAL (ICD-786.59) - she was seen in the ER by DrPulsipher in 2004 w/ atypical CP... her daughter, Arline Asp, is an Nutritional therapist...  ~  NuclearStressTest 9/04 was normal- no scar or ischemia, EF= 73%...  HYPERCHOLESTEROLEMIA (ICD-272.0) - on SIMVASTATIN 80mg /d, but not on much of  a diet... we discussed low chol/ low fat diet and set goals for losing some weight...  ~  previous labs showed cholesterol as high as 350 in the past... prev on numerous statin meds.  ~  FLP 11/08 ?off Lova40 showed TChol 252, TG 236, HDL 49, LDL 163...  ~  FLP 10/09 on Simva80 showed TChol 212, TG 124, HDL 64, LDL 119...  ~  FLP 4/10 showed TChol 206, TG 95, HDL 71, LDL 112... I checked w/ her pharm- filled Simva80 #30 on 2/24 & 4/20... rec> take med daily, add Fish Oil.  ~ 8/10   Tchol 250, TG 175, HDL 46.6, and LDL 172  DM - A1C 7.1 8/10  Health Maintenence: Mammogram 09/2008>>   Family History: Reviewed history from 12/05/2008 and no changes required. mother died at 3 from copd, also hasd arthritis, emphysema father died at 42 from brain  tumor sibling 1 died at 10, heart disease, diabetes sibling 2 living with back problems, allergies family history of prostate cancer dm:  brother (deceased)  Social History: Reviewed history from 09/21/2007 and no changes required. never smoked positive for second-hand smoke exposure no exercise no caffeine married 3 children does not work outside the home  Review of Systems       she attributes diarrhea to metformin.  she has blurry vision, leg cramps, excesive diaphoresis, and nocturia. denies weight loss, chest pain, sob, n/v, urinary frequency, memory loss, depression, menopausal sxs, rhinorrhea, and easy bruising   Physical Exam  General:  obese.  no distress  Head:  head: no deformity eyes: no periorbital swelling, no proptosis external nose and ears are normal mouth: no lesion seen Neck:  Supple without thyroid enlargement or tenderness.  Lungs:  Clear to auscultation bilaterally. Normal respiratory effort.  Heart:  Regular rate and rhythm without murmurs or gallops noted. Normal S1,S2.   Abdomen:  abdomen is soft, nontender.  no hepatosplenomegaly.   not distended.  no hernia  Msk:  muscle bulk and strength are grossly normal.  no obvious joint swelling.  gait is normal and steady  Pulses:  dorsalis pedis intact bilat.  no carotid bruit  Extremities:  no deformity.  no ulcer on the feet.  feet are of normal color and temp.  trace right pedal edema and trace left pedal edema.   Neurologic:  cn 2-12 grossly intact.   readily moves all 4's.   sensation is intact to touch on the feet  Skin:  normal texture and temp.  no rash.  not diaphoretic there is a healing tick bite at over the left scapula Cervical Nodes:  No significant adenopathy.  Psych:  Alert and cooperative; normal mood and affect; normal attention span and concentration.     Impression & Recommendations:  Problem # 1:  DIABETES MELLITUS (ICD-250.00) needs increased rx.  pt requests to exhaust all  oral options prior to starting insulin.  Problem # 2:  diarrhea prob due to metformin  Problem # 3:  edema mild.  it may be caused by amlodipine.  it is a relative contraindication to actos  Problem # 4:  HYPERTENSION (ICD-401.9) needs increased rx, so amlodipine can't be reduced now  Medications Added to Medication List This Visit: 1)  Hydrochlorothiazide 25 Mg Tabs (Hydrochlorothiazide) .Marland Kitchen.. 1 by mouth once daily as needed 2)  Metformin Hcl 500 Mg Xr24h-tab (Metformin hcl) .Marland Kitchen.. 1 tab two times a day 3)  Benazepril Hcl 40 Mg Tabs (Benazepril hcl) .Marland Kitchen.. 1 once daily 4)  Glimepiride 4 Mg Tabs (Glimepiride) .Marland Kitchen.. 1 tab two times a day 5)  Januvia 100 Mg Tabs (Sitagliptin phosphate) .Marland Kitchen.. 1 tab each am  Other Orders: Consultation Level IV (13086)  Patient Instructions: 1)  good diet and exercise habits significanly improve the control of your diabetes.  please let me know if you wish to be referred to a dietician.  high blood sugar is very risky to your health.  you should see an eye doctor every year. 2)  controlling your blood pressure and cholesterol drastically reduces the damage diabetes does to your body.  this also applies to quitting smoking.  please discuss these with your doctor.  you should take an aspirin every day, unless you have been advised by a doctor not to. 3)  we will need to take this complex situation in stages 4)  check your blood sugar 2 times a day.  vary the time of day when you check, between before the 3 meals, and at bedtime.  also check if you have symptoms of your blood sugar being too high or too low.  please keep a record of the readings and bring it to your next appointment here.  please call us sooner if you are having low blood sugar episodes. 5)  for now, increase benazepril to 40 mg once daily, and continue amlodipine--we'll try to reduce the amlodipine at a future date.   6)  increase glimepiride to 4 mg two times a day 7)  add januvia 100 mg each am. 8)   return 1 month. Prescriptions: GLIMEPIRIDE 4 MG TABS (GLIMEPIRIDE) 1 tab two times a day  #60 x 11   Entered and Authorized by:   Minus Breeding MD   Signed by:   Minus Breeding MD on 06/14/2009   Method used:   Electronically to        [facility]  [phone]* (retail)       [address]       [place], [place]  [address]       Ph: [phone] or [phone]       Fax: [phone]   RxID:   [id number] BENAZEPRIL HCL 40 MG TABS (BENAZEPRIL HCL) 1 once daily  #30 x 11   Entered and Authorized by:   Minus Breeding MD   Signed by:   Minus Breeding MD on 06/14/2009   Method used:   Electronically to        [facility]  [phone]* (retail)       [address]       [place], [place]  [address]       Ph: [phone] or [phone]       Fax: [phone]   RxID:   [id number] METFORMIN HCL 500 MG XR24H-TAB (METFORMIN HCL) 1 tab two times a day  #60 x 11   Entered and Authorized by:   Minus Breeding MD   Signed by:   Minus Breeding MD on 06/14/2009   Method used:   Electronically to        [facility]  [phone]* (retail)       [address]       [place], [place]  [address]       Ph: [phone] or [phone]       Fax: [phone]  RxID:   [id number]

## 2010-03-04 NOTE — Letter (Signed)
Summary: [facility]  [facility]   Imported By: Sherian Rein 06/27/2009 11:40:54  _____________________________________________________________________  External Attachment:    Type:   Image     Comment:   External Document

## 2010-03-04 NOTE — Progress Notes (Signed)
Summary: referral  Phone Note Call from Patient Call back at Home Phone [phone] Call back at [phone]   Caller: Patient Call For: nadel Reason for Call: Referral Summary of Call: Pt states her sugar is running high and she needs a referral from SN to see Dr. Everardo All pls advise. Initial call taken by: Darletta Moll,  May 17, 2009 3:45 PM  Follow-up for Phone Call        [email or web address] Randell Loop [facility]  May 17, 2009 3:54 PM   Spoke with pt.  Pt states BS have been running high - over 200 and as high as 354.  Requesting referral to Dr. Everardo All for DM.  Will forward to SN - pls advise if this is ok.  Thanks!  Follow-up by: Gweneth Dimitri RN,  May 21, 2009 12:43 PM  Additional Follow-up for Phone Call Additional follow up Details #1::        per SN---yes we can refer her to Dr. Everardo All.  thanks  order has been sent to [facility] already. Randell Loop [facility]  May 21, 2009 12:51 PM     Additional Follow-up for Phone Call Additional follow up Details #2::    Spoke with pt.  Pt informed ok per SN to refer her to Dr. Everardo All for DM.  She verbalized understanding. Follow-up by: Gweneth Dimitri RN,  May 21, 2009 12:56 PM

## 2010-03-04 NOTE — Medication Information (Signed)
Summary: Glucose testing supplies/[facility]  Glucose testing supplies/[facility]   Imported By: Lester [place] 02/15/2009 11:39:51  _____________________________________________________________________  External Attachment:    Type:   Image     Comment:   External Document

## 2010-03-04 NOTE — Progress Notes (Signed)
Summary: ORDERS  Phone Note From Other Clinic Call back at [phone]   Caller: [facility] Call For: Ziv Welchel Summary of Call: CALLING ABOUT PHYSICANS ORDER THAT WAS SENT FOR DIABETIC SUPPLIES Initial call taken by: Rickard Patience,  February 13, 2009 12:42 PM  Follow-up for Phone Call        unable to reach backy at the number given. will await a return call. Carron Curie CMA  February 13, 2009 3:16 PM

## 2010-03-04 NOTE — Progress Notes (Signed)
Summary: bronchitis > zpak, promethazine codiene rx  Phone Note Call from Patient Call back at [facility] Phone [phone]   Caller: Patient Call For: [facility] ([place]) Summary of Call: prod cough with yellow/green mucus, wheezing, increased SOB, occ fever/chills, sinus pressure/congestion with same colored mucus, PND x6days.  requesting zpak and generic tussionex.  [facility] [address]  **note: pt last seen by SN 5.2010, TP 11.2010.  declined ov.  Allergies (verified):  1)  ! Rocephin 2)  ! Cleocin 3)  ! Sulfa 4)  ! Keflex 5)  ! Percocet Initial call taken by: Boone Master CNA/MA,  December 31, 2009 8:46 AM  Follow-up for Phone Call        per SN----ok for pt to have zpak #1,  and phenergan expect. with codeine cough syrup   #6oz  1 tp by mouth every 6 hours as needed for cough.  thanks Randell Loop CMA  December 31, 2009 9:26 AM   Called, spoke with pt.  She was informed of above recs per SN and aware rxs sent to [facility] [address]  She verbalized understanding of this.  Gweneth Dimitri RN  December 31, 2009 9:38 AM     New/Updated Medications: ZITHROMAX Z-PAK 250 MG TABS (AZITHROMYCIN) take as directed PROMETHAZINE-CODEINE 6.25-10 MG/5ML SYRP (PROMETHAZINE-CODEINE) 1 tsp by mouth every 6 hours as needed cough Prescriptions: PROMETHAZINE-CODEINE 6.25-10 MG/5ML SYRP (PROMETHAZINE-CODEINE) 1 tsp by mouth every 6 hours as needed cough  #6oz x 0   Entered by:   Gweneth Dimitri RN   Authorized by:   Michele Mcalpine MD   Signed by:   Gweneth Dimitri RN on 12/31/2009   Method used:   Telephoned to ...       [facility] [address] [phone]* (retail)       [address]       [place], [place]  [address]       Ph: [phone]       Fax: [phone]   RxID:   [id number] ZITHROMAX Z-PAK 250 MG TABS (AZITHROMYCIN) take as directed  #1 x 0   Entered by:   Gweneth Dimitri RN   Authorized by:   Michele Mcalpine MD   Signed by:   Gweneth Dimitri RN on 12/31/2009   Method used:   Electronically to        [facility] [phone]* (retail)       [address]       [place], [place]  [address]       Ph: [phone]       Fax: [phone]   RxID:   [id number]
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
## 2010-03-04 NOTE — Assessment & Plan Note (Signed)
Summary: 1 mth fu--d/t---stc   Vital Signs:  Patient profile:   70 year old female Height:      68 inches (172.72 cm) Weight:      201.12 pounds (91.42 kg) O2 Sat:      94 % on Room air Temp:     98.0 degrees F (36.67 degrees C) oral Pulse rate:   68 / minute BP sitting:   142 / 80  (left arm) Cuff size:   regular  Vitals Entered By: Orlan Leavens (July 08, 2009 8:08 AM)  O2 Flow:  Room air CC: 1 month follow-up/ pt states she stop taking the glimepiride due to  BS dropping low Is Patient Diabetic? Yes Did you bring your meter with you today? No   Primary Provider:  nadel   CC:  1 month follow-up/ pt states she stop taking the glimepiride due to  BS dropping low.  History of Present Illness: the status of at least 3 ongoing medical problems is addressed today: dm:  no cbg record, but states cbg's went low, so she had to stop the glimepiride.  she says it is well-controlled on just the metformin and [place]. cough:  is slight but ongoing. edema: is mild, as this visit is in the am.  Current Medications (verified): 1)  Albuterol Sulfate (2.5 Mg/97ml) 0.083%  Nebu (Albuterol Sulfate) .... As Needed 2)  Klor-Con M20 20 Meq  Cr-Tabs (Potassium Chloride Crys Cr) .... Take 1 Tablet By Mouth Once A Day 3)  Simvastatin 80 Mg  Tabs (Simvastatin) .... Take 1 Tab By Mouth At Bedtime 4)  Omeprazole 20 Mg  Cpdr (Omeprazole) .Marland Kitchen.. 1 By Mouth Once Daily 5)  Xanax 0.5 Mg  Tabs (Alprazolam) .... 1/2 - 1 By Mouth Three Times A Day As Needed Nerves 6)  Zoloft 50 Mg  Tabs (Sertraline Hcl) .Marland Kitchen.. 1 By Mouth Once Daily 7)  Vitamin D 2000 Unit Tabs (Cholecalciferol) .... Take 1 Tablet By Mouth Once A Day 8)  Amlodipine Besylate 5 Mg Tabs (Amlodipine Besylate) .... 2 By Mouth Once Daily 9)  Hydrochlorothiazide 25 Mg Tabs (Hydrochlorothiazide) .Marland Kitchen.. 1 By Mouth Once Daily As Needed 10)  Metformin Hcl 500 Mg Xr24h-Tab (Metformin Hcl) .Marland Kitchen.. 1 Tab Two Times A Day 11)  Benazepril Hcl 40 Mg Tabs (Benazepril Hcl)  .Marland Kitchen.. 1 Once Daily 12)  Glimepiride 4 Mg Tabs (Glimepiride) .Marland Kitchen.. 1 Tab Two Times A Day 13)  Januvia 100 Mg Tabs (Sitagliptin Phosphate) .Marland Kitchen.. 1 Tab Each Am  Allergies (verified): 1)  ! Rocephin 2)  ! Cleocin 3)  ! Sulfa 4)  ! Keflex 5)  ! Percocet  Past History:  Past Medical History: Last updated: 12/05/2008 ALLERGIC RHINITIS (ICD-477.9) - uses OTC meds Prn...  Hx of ASTHMATIC BRONCHITIS, ACUTE (ICD-466.0) - uses NEB w/ Albuterol Prn...  HYPERTENSION (ICD-401.9) - currently on AMLODIPINE 10mg /d, BENAZEPRIL 20mg /d, HCTZ 25mg /d, KCl 30mEg/d... she admits taking meds irreg & currently not taking the Hct daily...   ~  5/10:change to [facility] HCT- 10/160/12.5 daily + KCl 1/d.  Hx of CHEST PAIN, ATYPICAL (ICD-786.59) - she was seen in the ER by DrPulsipher in 2004 w/ atypical CP... her daughter, Arline Asp, is an Nutritional therapist...  ~  NuclearStressTest 9/04 was normal- no scar or ischemia, EF= 73%...  HYPERCHOLESTEROLEMIA (ICD-272.0) - on SIMVASTATIN 80mg /d, but not on much of a diet... we discussed low chol/ low fat diet and set goals for losing some weight...  ~  previous labs showed cholesterol as high as  350 in the past... prev on numerous statin meds.  ~  FLP 11/08 ?off Lova40 showed TChol 252, TG 236, HDL 49, LDL 163...  ~  FLP 10/09 on Simva80 showed TChol 212, TG 124, HDL 64, LDL 119...  ~  FLP 4/10 showed TChol 206, TG 95, HDL 71, LDL 112... I checked w/ her pharm- filled Simva80 #30 on 2/24 & 4/20... rec> take med daily, add Fish Oil.  ~ 8/10   Tchol 250, TG 175, HDL 46.6, and LDL 172  DM - A1C 7.1 8/10  Health Maintenence: Mammogram 09/2008>>   Review of Systems  The patient denies chest pain, syncope, and dyspnea on exertion.    Physical Exam  General:  normal appearance.   Neck:  Supple without thyroid enlargement or tenderness.  Lungs:  Clear to auscultation bilaterally. Normal respiratory effort.    Impression & Recommendations:  Problem # 1:  DIABETES MELLITUS  (ICD-250.00) well-controlled  Problem # 2:  edema a relative contraidication to actos  Problem # 3:  cough pt declines changing benazepril to a different med  Other Orders: Est. Patient Level III ([address]109) Est. Patient Level IV (60454)  Patient Instructions: 1)  check your blood sugar 2 times a day.  vary the time of day when you check, between before the 3 meals, and at bedtime.  also check if you have symptoms of your blood sugar being too high or too low.  please keep a record of the readings and bring it to your next appointment here.  2)  for now, please continue the same medications. 3)  return 3 months.

## 2010-03-05 ENCOUNTER — Ambulatory Visit (INDEPENDENT_AMBULATORY_CARE_PROVIDER_SITE_OTHER): Payer: MEDICARE | Admitting: Pulmonary Disease

## 2010-03-05 ENCOUNTER — Encounter: Payer: Self-pay | Admitting: Pulmonary Disease

## 2010-03-05 ENCOUNTER — Encounter (INDEPENDENT_AMBULATORY_CARE_PROVIDER_SITE_OTHER): Payer: Self-pay | Admitting: *Deleted

## 2010-03-05 ENCOUNTER — Other Ambulatory Visit: Payer: MEDICARE

## 2010-03-05 ENCOUNTER — Other Ambulatory Visit: Payer: Self-pay | Admitting: Endocrinology

## 2010-03-05 DIAGNOSIS — E785 Hyperlipidemia, unspecified: Secondary | ICD-10-CM

## 2010-03-05 DIAGNOSIS — I10 Essential (primary) hypertension: Secondary | ICD-10-CM

## 2010-03-05 DIAGNOSIS — J209 Acute bronchitis, unspecified: Secondary | ICD-10-CM

## 2010-03-05 DIAGNOSIS — D509 Iron deficiency anemia, unspecified: Secondary | ICD-10-CM

## 2010-03-05 DIAGNOSIS — E669 Obesity, unspecified: Secondary | ICD-10-CM

## 2010-03-05 DIAGNOSIS — E7800 Pure hypercholesterolemia, unspecified: Secondary | ICD-10-CM

## 2010-03-05 DIAGNOSIS — E119 Type 2 diabetes mellitus without complications: Secondary | ICD-10-CM

## 2010-03-05 DIAGNOSIS — K219 Gastro-esophageal reflux disease without esophagitis: Secondary | ICD-10-CM

## 2010-03-05 DIAGNOSIS — R0789 Other chest pain: Secondary | ICD-10-CM

## 2010-03-05 LAB — HEMOGLOBIN A1C: Hgb A1c MFr Bld: 8 % — ABNORMAL HIGH (ref 4.6–6.5)

## 2010-03-05 LAB — URINALYSIS, ROUTINE W REFLEX MICROSCOPIC
Bilirubin Urine: NEGATIVE
Ketones, ur: NEGATIVE
Nitrite: NEGATIVE
Specific Gravity, Urine: 1.02 (ref 1.000–1.030)
Urine Glucose: NEGATIVE
Urobilinogen, UA: 0.2 (ref 0.0–1.0)
pH: 6.5 (ref 5.0–8.0)

## 2010-03-05 LAB — CBC WITH DIFFERENTIAL/PLATELET
Basophils Absolute: 0 10*3/uL (ref 0.0–0.1)
Basophils Relative: 0.2 % (ref 0.0–3.0)
Eosinophils Absolute: 0 10*3/uL (ref 0.0–0.7)
Eosinophils Relative: 0.1 % (ref 0.0–5.0)
Hemoglobin: 14.5 g/dL (ref 12.0–15.0)
Lymphocytes Relative: 19 % (ref 12.0–46.0)
MCHC: 33.8 g/dL (ref 30.0–36.0)
Monocytes Relative: 5.5 % (ref 3.0–12.0)
Neutro Abs: 9.1 10*3/uL — ABNORMAL HIGH (ref 1.4–7.7)
Neutrophils Relative %: 75.2 % (ref 43.0–77.0)
Platelets: 248 10*3/uL (ref 150.0–400.0)
RDW: 14.7 % — ABNORMAL HIGH (ref 11.5–14.6)

## 2010-03-05 LAB — BASIC METABOLIC PANEL
CO2: 32 mEq/L (ref 19–32)
Calcium: 9.9 mg/dL (ref 8.4–10.5)
Creatinine, Ser: 0.7 mg/dL (ref 0.4–1.2)
GFR: 91.02 mL/min (ref >60.00)
Potassium: 4.7 mEq/L (ref 3.5–5.1)
Sodium: 140 mEq/L (ref 135–145)

## 2010-03-05 LAB — LIPID PANEL
Cholesterol: 240 mg/dL — ABNORMAL HIGH (ref 0–200)
Total CHOL/HDL Ratio: 3
VLDL: 19 mg/dL (ref 0.0–40.0)

## 2010-03-06 NOTE — Assessment & Plan Note (Signed)
Summary: Acute NP office visit - bronchitis   Primary Provider/Referring Provider:  nadel   CC:  cough and congesiton x 1 week .  History of Present Illness: 70 -year-old female patient has a known history of DM,  hypertension and hyperlipidemia    September 21, 2007 --Presents for left leg redness and swelling. Stepped onto chair and fell backward 2 weeks, fell hard onto floor onto left hip, scraped left shin, now red swollen and tender. Feels very hot, and throbs. neg venous doppler for DVT. tx Augmentin x 10 days (multi ab allergies).   November 10, 2007 --follow up. recent flare in hip pain, cortisone injection in both hips by Dr. Carolyn Stare).  Doing well until this AM with nasal congestion, drainage, sore throat. Tolerating meds well, no problems.  --labs showed good cholestrol control, A1C 6.7.   December 12, 2007--Presents for 2 weeks of left earache, nasal drip, pain along left side of neck, sore to touch. Took zpack 1 week ago, no better. 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 Has hx of recurrent breakouts. Uses valtrex as needed.  --dx with neck strain-rx nsaids, and dx -AR-symptomatic tx.   January 17, 2008 -returns for productive cough, congestion with thick yellow mucus, wheezing. malaise. Took Augmentin on 01/03/08. Symtoms still going on. Sinus pain/pressure.    September 04, 2008--Presents for 3 month follow up.  states will not take the exforge b/c it is $42 a bottle or the glimepriride.  pt states is taking 2 amlodipine and 1 benazapril, and for BS taking 1 metformin. She had several low sugars on glimepiride 2mg  once daily. Denies chest pain, dyspnea, orthopnea, hemoptysis, fever, n/v/d, edema, headache,recent travel or antibiotics.    December 05, 2008---Presents for 3 months f/u.  States she fell and broke her L. shoulder in late Sept,requiring surgery. C/o intermittent HA, sore throat, and L. ear pain for 3 days. .  Denies chest pain, dyspnea, orthopnea,  hemoptysis, fever, n/v/d, edema, Labs done in August showed A1C 7.1,  Tchol 250, TG 175, HDL 46.6, and LDL 172 was not taking statin. We discussed lifestyle modification and med compliance.  January 14, 2010 -Presents for an acute office visit. Complians of worsening cough and congestion x 2 weeks. Feels aches and cough is causing her to be sore. Finished zpack 1 week ago, got some better but not totally now creeping back and severe cough last night. Of not she is on ACE inhibitor. She has not had labs in > 1 year.  Denies chest pain,  orthopnea, hemoptysis, fever, n/v/d, edema, headache.  Medications Prior to Update: 1)  Albuterol Sulfate (2.5 Mg/66ml) 0.083%  Nebu (Albuterol Sulfate) .... As Needed 2)  Klor-Con M20 20 Meq  Cr-Tabs (Potassium Chloride Crys Cr) .... Take 1 Tablet By Mouth Once A Day 3)  Simvastatin 80 Mg  Tabs (Simvastatin) .... Take 1 Tab By Mouth At Bedtime 4)  Omeprazole 20 Mg  Cpdr (Omeprazole) .Marland Kitchen.. 1 By Mouth Once Daily 5)  Xanax 0.5 Mg  Tabs (Alprazolam) .... 1/2 - 1 By Mouth Three Times A Day As Needed Nerves 6)  Zoloft 50 Mg  Tabs (Sertraline Hcl) .Marland Kitchen.. 1 By Mouth Once Daily 7)  Vitamin D 2000 Unit Tabs (Cholecalciferol) .... Take 1 Tablet By Mouth Once A Day 8)  Amlodipine Besylate 5 Mg Tabs (Amlodipine Besylate) .... 2 By Mouth Once Daily 9)  Hydrochlorothiazide 25 Mg Tabs (Hydrochlorothiazide) .Marland Kitchen.. 1 By Mouth Once Daily As Needed 10)  Metformin Hcl 500 Mg Xr24h-Tab (Metformin Hcl) .Marland Kitchen.. 1 Tab Two Times A Day 11)  Benazepril Hcl 40 Mg Tabs (Benazepril Hcl) .Marland Kitchen.. 1 Once Daily 12)  Januvia 100 Mg Tabs (Sitagliptin Phosphate) .Marland Kitchen.. 1 Tab Each Am 13)  Zithromax Z-Pak 250 Mg Tabs (Azithromycin) .... Take As Directed 14)  Promethazine-Codeine 6.25-10 Mg/44ml Syrp (Promethazine-Codeine) .Marland Kitchen.. 1 Tsp By Mouth Every 6 Hours As Needed Cough  Current Medications (verified): 1)  Albuterol Sulfate (2.5 Mg/35ml) 0.083%  Nebu (Albuterol Sulfate) .... As Needed 2)  Klor-Con M20 20 Meq   Cr-Tabs (Potassium Chloride Crys Cr) .... Take 1 Tablet By Mouth Once A Day 3)  Simvastatin 80 Mg  Tabs (Simvastatin) .... Take 1 Tab By Mouth At Bedtime 4)  Omeprazole 20 Mg  Cpdr (Omeprazole) .Marland Kitchen.. 1 By Mouth Once Daily 5)  Xanax 0.5 Mg  Tabs (Alprazolam) .... 1/2 - 1 By Mouth Three Times A Day As Needed Nerves 6)  Zoloft 50 Mg  Tabs (Sertraline Hcl) .Marland Kitchen.. 1 By Mouth Once Daily 7)  Vitamin D 2000 Unit Tabs (Cholecalciferol) .... Take 1 Tablet By Mouth Once A Day 8)  Amlodipine Besylate 5 Mg Tabs (Amlodipine Besylate) .... 2 By Mouth Once Daily 9)  Hydrochlorothiazide 25 Mg Tabs (Hydrochlorothiazide) .Marland Kitchen.. 1 By Mouth Once Daily As Needed 10)  Metformin Hcl 500 Mg Xr24h-Tab (Metformin Hcl) .Marland Kitchen.. 1 Tab Two Times A Day 11)  Benazepril Hcl 40 Mg Tabs (Benazepril Hcl) .Marland Kitchen.. 1 Once Daily 12)  Januvia 100 Mg Tabs (Sitagliptin Phosphate) .Marland Kitchen.. 1 Tab Each Am 13)  Promethazine-Codeine 6.25-10 Mg/63ml Syrp (Promethazine-Codeine) .Marland Kitchen.. 1 Tsp By Mouth Every 6 Hours As Needed Cough  Allergies: 1)  ! Rocephin 2)  ! Cleocin 3)  ! Sulfa 4)  ! Keflex 5)  ! Percocet  Past History:  Past Medical History: Last updated: 12/05/2008 ALLERGIC RHINITIS (ICD-477.9) - uses OTC meds Prn...  Hx of ASTHMATIC BRONCHITIS, ACUTE (ICD-466.0) - uses NEB w/ Albuterol Prn...  HYPERTENSION (ICD-401.9) - currently on AMLODIPINE 10mg /d, BENAZEPRIL 20mg /d, HCTZ 25mg /d, KCl 63mEg/d... she admits taking meds irreg & currently not taking the Hct daily...   ~  5/10:change to [facility] HCT- 10/160/12.5 daily + KCl 1/d.  Hx of CHEST PAIN, ATYPICAL (ICD-786.59) - she was seen in the ER by DrPulsipher in 2004 w/ atypical CP... her daughter, Arline Asp, is an Nutritional therapist...  ~  NuclearStressTest 9/04 was normal- no scar or ischemia, EF= 73%...  HYPERCHOLESTEROLEMIA (ICD-272.0) - on SIMVASTATIN 80mg /d, but not on much of a diet... we discussed low chol/ low fat diet and set goals for losing some weight...  ~  previous labs showed cholesterol as high  as 350 in the past... prev on numerous statin meds.  ~  FLP 11/08 ?off Lova40 showed TChol 252, TG 236, HDL 49, LDL 163...  ~  FLP 10/09 on Simva80 showed TChol 212, TG 124, HDL 64, LDL 119...  ~  FLP 4/10 showed TChol 206, TG 95, HDL 71, LDL 112... I checked w/ her pharm- filled Simva80 #30 on 2/24 & 4/20... rec> take med daily, add Fish Oil.  ~ 8/10   Tchol 250, TG 175, HDL 46.6, and LDL 172  DM - A1C 7.1 8/10  Health Maintenence: Mammogram 09/2008>>   Past Surgical History: Last updated: 12/05/2008 s/p laminectomy 1993-[place] s/p hysterectomy-1974 Bladder tack-1974 s/p cholecystectomy s/p tonsillectomy S/P right rotator cuff repair 8/03 by Tora Perches S/P posterior repair for enterocele 6/08 by [name] S/P left rotator cuff repair 10-26-2008 by DrBlackmon  Family History:  Last updated: 06/14/2009 mother died at 29 from copd, also hasd arthritis, emphysema father died at 1 from brain tumor sibling 1 died at 75, heart disease, diabetes sibling 2 living with back problems, allergies family history of prostate cancer dm:  brother (deceased)  Social History: Last updated: 06/14/2009 never smoked positive for second-hand smoke exposure no exercise no caffeine married 3 children does not work outside the home  Risk Factors: Smoking Status: never (12/17/2006)  Review of Systems      See HPI  Vital Signs:  Patient profile:   70 year old female Height:      68 inches Weight:      201.50 pounds BMI:     30.75 O2 Sat:      96 % on Room air Temp:     97.9 degrees F oral Pulse rate:   83 / minute BP sitting:   150 / 64  (right arm) Cuff size:   regular  Vitals Entered By: Boone Master CNA/MA (January 14, 2010 11:50 AM)  O2 Flow:  Room air CC: cough and congesiton x 1 week  Is Patient Diabetic? Yes Comments Medications reviewed with patient Daytime contact number verified with patient. Boone Master CNA/MA  January 14, 2010 11:50 AM    Physical  Exam  Additional Exam:  GEN: A/Ox3; pleasant , NAD HEENT:  [place]/AT, , EACs-clear, TMs-wnl, NOSE-clear, THROAT-clear NECK:  Supple w/ fair ROM; no JVD; normal carotid impulses w/o bruits; no thyromegaly or nodules palpated; no lymphadenopathy. RESP  Coarse BS w/ no wheezing  CARD:  RRR, no m/r/g   GI:   Soft & nt; nml bowel sounds; no organomegaly or masses detected. [place]: Warm bil,  no calf tenderness edema, clubbing, pulses intact N    Impression & Recommendations:  Problem # 1:  Hx of ASTHMATIC BRONCHITIS, ACUTE (ICD-466.0) Augmentin 875mg  two times a day for 7 days w/ food  Mucinex DM two times a day as needed cough/congestion  Increase fluids and rest.  Tylenol as needed  Please contact office for sooner follow up if symptoms do not improve or worsen  Orders: Est. Patient Level IV (04540)  The following medications were removed from the medication list:    Zithromax Z-pak 250 Mg Tabs (Azithromycin) .Marland Kitchen... Take as directed Her updated medication list for this problem includes:    Albuterol Sulfate (2.5 Mg/67ml) 0.083% Nebu (Albuterol sulfate) .Marland Kitchen... As needed    Promethazine-codeine 6.25-10 Mg/93ml Syrp (Promethazine-codeine) .Marland Kitchen... 1 tsp by mouth every 6 hours as needed cough    Augmentin 875-125 Mg Tabs (Amoxicillin-pot clavulanate) .Marland Kitchen... 1 by mouth two times a day  Problem # 2:  DIABETES MELLITUS (ICD-250.00) labs today encouraged her to make follow up ov w/ Dr. Everardo All will fax labs  Her updated medication list for this problem includes:    Metformin Hcl 500 Mg Xr24h-tab (Metformin hcl) .Marland Kitchen... 1 tab two times a day    Januvia 100 Mg Tabs (Sitagliptin phosphate) .Marland Kitchen... 1 tab each am  Orders: TLB-A1C / Hgb A1C (Glycohemoglobin) (83036-A1C) TLB-BMP (Basic Metabolic Panel-BMET) (80048-METABOL) Est. Patient Level IV (98119)  Medications Added to Medication List This Visit: 1)  Augmentin 875-125 Mg Tabs (Amoxicillin-pot clavulanate) .Marland Kitchen.. 1 by mouth two times a day 2)  Acyclovir  400 Mg Tabs (Acyclovir) .Marland Kitchen.. 1 by mouth three times a day for 5 days as needed 3)  Acyclovir 400 Mg Tabs (Acyclovir) .Marland Kitchen.. 1 by mouth three times a day for 5 days as needed at onset of symptoms  Complete Medication List: 1)  Albuterol Sulfate (2.5 Mg/77ml) 0.083% Nebu (Albuterol sulfate) .... As needed 2)  Klor-con M20 20 Meq Cr-tabs (Potassium chloride crys cr) .... Take 1 tablet by mouth once a day 3)  Simvastatin 80 Mg Tabs (Simvastatin) .... Take 1 tab by mouth at bedtime 4)  Omeprazole 20 Mg Cpdr (Omeprazole) .Marland Kitchen.. 1 by mouth once daily 5)  Xanax 0.5 Mg Tabs (Alprazolam) .... 1/2 - 1 by mouth three times a day as needed nerves 6)  Zoloft 50 Mg Tabs (Sertraline hcl) .Marland Kitchen.. 1 by mouth once daily 7)  Vitamin D 2000 Unit Tabs (Cholecalciferol) .... Take 1 tablet by mouth once a day 8)  Amlodipine Besylate 5 Mg Tabs (Amlodipine besylate) .... 2 by mouth once daily 9)  Hydrochlorothiazide 25 Mg Tabs (Hydrochlorothiazide) .Marland Kitchen.. 1 by mouth once daily as needed 10)  Metformin Hcl 500 Mg Xr24h-tab (Metformin hcl) .Marland Kitchen.. 1 tab two times a day 11)  Benazepril Hcl 40 Mg Tabs (Benazepril hcl) .Marland Kitchen.. 1 once daily 12)  Januvia 100 Mg Tabs (Sitagliptin phosphate) .Marland Kitchen.. 1 tab each am 13)  Promethazine-codeine 6.25-10 Mg/22ml Syrp (Promethazine-codeine) .Marland Kitchen.. 1 tsp by mouth every 6 hours as needed cough 14)  Augmentin 875-125 Mg Tabs (Amoxicillin-pot clavulanate) .Marland Kitchen.. 1 by mouth two times a day 15)  Acyclovir 400 Mg Tabs (Acyclovir) .Marland Kitchen.. 1 by mouth three times a day for 5 days as needed at onset of symptoms  Other Orders: Depo- Medrol 80mg  (J1040) Depo- Medrol 40mg  (J1030) Admin of Therapeutic Inj  intramuscular or subcutaneous (78295)  Patient Instructions: 1)  Augmentin 875mg  two times a day for 7 days w/ food  2)  Mucinex DM two times a day as needed cough/congestion  3)  Increase fluids and rest.  4)  Tylenol as needed  5)  Please contact office for sooner follow up if symptoms do not improve or worsen   Prescriptions: ACYCLOVIR 400 MG TABS (ACYCLOVIR) 1 by mouth three times a day for 5 days as needed at onset of symptoms  #60 x 2   Entered by:   Boone Master CNA/MA   Authorized by:   Rubye Oaks NP   Signed by:   Boone Master CNA/MA on 01/14/2010   Method used:   Electronically to        [facility]  [phone]* (retail)       [address]       [place], [place]  [address]       Ph: [phone] or [phone]       Fax: [phone]   RxID:   [id number] XANAX 0.5 MG  TABS (ALPRAZOLAM) 1/2 - 1 by mouth three times a day as needed nerves  #90 x 0   Entered and Authorized by:   Rubye Oaks NP   Signed by:   Tammy Parrett NP on 01/14/2010   Method used:   Print then Give to Patient   RxID:   [id number] ACYCLOVIR 400 MG TABS (ACYCLOVIR) 1 by mouth three times a day for 5 days as needed  #15 x 2   Entered and Authorized by:   Rubye Oaks NP   Signed by:   Tammy Parrett NP on 01/14/2010   Method used:   Print then Give to Patient   RxID:   [id number] SIMVASTATIN 80 MG  TABS (SIMVASTATIN) Take 1 tab by mouth at bedtime  #30 Each x 2   Entered and Authorized by:  Rubye Oaks NP   Signed by:   Rubye Oaks NP on 01/14/2010   Method used:   Electronically to        [facility]  [phone]* (retail)       [address]       [place], [place]  [address]       Ph: [phone] or [phone]       Fax: [phone]   RxID:   [id number] AMLODIPINE BESYLATE 5 MG TABS (AMLODIPINE BESYLATE) 2 by mouth once daily  #60 Each x 2   Entered and Authorized by:   Rubye Oaks NP   Signed by:   Rubye Oaks NP on 01/14/2010   Method used:   Electronically to        [facility]  [phone]* (retail)       [address]       [place], [place]  [address]       Ph: [phone] or [phone]       Fax: [phone]   RxID:   [id number] OMEPRAZOLE 20 MG  CPDR  (OMEPRAZOLE) 1 by mouth once daily  #30 Each x 2   Entered and Authorized by:   Rubye Oaks NP   Signed by:   Rubye Oaks NP on 01/14/2010   Method used:   Electronically to        [facility]  [phone]* (retail)       [address]       [place], [place]  [address]       Ph: [phone] or [phone]       Fax: [phone]   RxID:   [id number] ZOLOFT 50 MG  TABS (SERTRALINE HCL) 1 by mouth once daily  #30 Each x 2   Entered and Authorized by:   Rubye Oaks NP   Signed by:   Rubye Oaks NP on 01/14/2010   Method used:   Electronically to        [facility]  [phone]* (retail)       [address]       [place], [place]  [address]       Ph: [phone] or [phone]       Fax: [phone]   RxID:   [id number] AUGMENTIN 875-125 MG TABS (AMOXICILLIN-POT CLAVULANATE) 1 by mouth two times a day  #14 x 0   Entered and Authorized by:   Rubye Oaks NP   Signed by:   Rubye Oaks NP on 01/14/2010   Method used:   Electronically to        [facility]  [phone]* (retail)       [address]       [place], [place]  [address]       Ph: [phone] or [phone]       Fax: [phone]   RxID:   [id number]      Medication Administration  Injection # 1:    Medication: Depo- Medrol 80mg     Diagnosis: ALLERGIC RHINITIS (ICD-477.9)    Route: IM    Site: RUOQ gluteus    Exp Date: 06/2012    Lot #: [id number]    Mfr: Pharmacia    Patient tolerated injection without complications    Given  by: Elray Buba RN (January 14, 2010 12:18 PM)  Injection # 2:    Medication: Depo- Medrol 40mg     Diagnosis: ALLERGIC RHINITIS (ICD-477.9)    Route: IM    Site: RUOQ gluteus    Exp Date: 06/2012    Lot #: [id number]    Mfr: Pharmacia    Patient tolerated injection without complications    Given by: Elray Buba RN (January 14, 2010 12:18 PM)  Orders Added: 1)   Depo- Medrol 80mg  [J1040] 2)  Depo- Medrol 40mg  [J1030] 3)  Admin of Therapeutic Inj  intramuscular or subcutaneous [96372] 4)  TLB-A1C / Hgb A1C (Glycohemoglobin) [83036-A1C] 5)  TLB-BMP (Basic Metabolic Panel-BMET) [80048-METABOL] 6)  Est. Patient Level IV [04540]

## 2010-03-06 NOTE — Progress Notes (Signed)
Summary: nos appt  Phone Note Call from Patient   Caller: [email or web address]  Call For: Markale Birdsell Summary of Call: Rsc nos from 1/26 to 2/1. Initial call taken by: Darletta Moll,  February 28, 2010 10:17 AM

## 2010-03-06 NOTE — Progress Notes (Signed)
Summary: prescription for UTI  Phone Note Call from Patient   Caller: Patient Call For: [facility] Summary of Call: patient phoned would like a prescription for Cipro called into [facility] on [address] she is getting ready to go out of town for a funeral and has a UTI. Patient can be reached at [phone] Initial call taken by: Vedia Coffer,  February 27, 2010 3:58 PM  Follow-up for Phone Call        Pt c/o urinary burning, frequency, urgency, odor, lower back pain starting yesterday. Pt states Cipro has worked in the past. Will be leaving out of town Sat for funeral. Katrina Stack yesterday. Please advise. Thanks. Zackery Barefoot CMA  February 27, 2010 4:08 PM  Allergies:  1)  ! Rocephin 2)  ! Cleocin 3)  ! Sulfa 4)  ! Keflex 5)  ! Percocet   Additional Follow-up for Phone Call Additional follow up Details #1::        per SN---ok for pt to have cipro 250mg   #14   1 by mouth two times a day until gone.  called and spoke with pt and she is aware that this med has been called to [facility] on [address] per pts request. Randell Loop CMA  February 27, 2010 5:04 PM     New/Updated Medications: CIPROFLOXACIN HCL 250 MG TABS (CIPROFLOXACIN HCL) take one tablet by mouth two times a day until gone Prescriptions: CIPROFLOXACIN HCL 250 MG TABS (CIPROFLOXACIN HCL) take one tablet by mouth two times a day until gone  #14 x 0   Entered by:   Randell Loop CMA   Authorized by:   Michele Mcalpine MD   Signed by:   Randell Loop CMA on 02/27/2010   Method used:   Historical   RxID:   [id number]

## 2010-03-07 NOTE — Medication Information (Signed)
Summary: Glucose Testing Supplies/[facility]  Glucose Testing Supplies/[facility]   Imported By: Sherian Rein 04/10/2009 12:02:06  _____________________________________________________________________  External Attachment:    Type:   Image     Comment:   External Document

## 2010-03-10 ENCOUNTER — Encounter: Payer: Self-pay | Admitting: Endocrinology

## 2010-03-10 ENCOUNTER — Ambulatory Visit (INDEPENDENT_AMBULATORY_CARE_PROVIDER_SITE_OTHER): Payer: MEDICARE | Admitting: Endocrinology

## 2010-03-10 DIAGNOSIS — Z2911 Encounter for prophylactic immunotherapy for respiratory syncytial virus (RSV): Secondary | ICD-10-CM

## 2010-03-10 DIAGNOSIS — Z23 Encounter for immunization: Secondary | ICD-10-CM

## 2010-03-10 DIAGNOSIS — E119 Type 2 diabetes mellitus without complications: Secondary | ICD-10-CM

## 2010-03-12 NOTE — Progress Notes (Signed)
Summary: appt questions  Phone Note Call from Patient Call back at Home Phone [phone]   Caller: Patient Call For: nadel Summary of Call: Pt has appt tomorrow at 3:30 and wants to know if she has to fast until then pls advise. Initial call taken by: Darletta Moll,  March 04, 2010 10:14 AM  Follow-up for Phone Call        Spoke with pt and she is requesting labs be entered so she can have them done tomorrow in the AM so she does not have to fast until 3:30. Please advise on what labs to enter. Carron Curie CMA  March 04, 2010 12:06 PM   per sn okay to have labs done. Dx codes FLP 272.0, BMET 401.9, liver 290.5, cbc w/ diff 285.9, tsh 244.9, A1c 250.00, udip Mindy Silva  March 04, 2010 2:24 PM   Additional Follow-up for Phone Call Additional follow up Details #1::        Spoke with pt and advised okay to come in fasting in the am for labs.  Orders placed in Epic.  Additional Follow-up by: Vernie Murders,  March 04, 2010 2:41 PM

## 2010-03-12 NOTE — Assessment & Plan Note (Signed)
Summary: pt had to resch from 1/26 due to family emergency/mhh   Vital Signs:  Patient profile:   70 year old female Height:      68 inches Weight:      203.25 pounds BMI:     31.02 O2 Sat:      96 % on Room air Temp:     97.6 degrees F oral Pulse rate:   72 / minute BP sitting:   132 / 66  (right arm) Cuff size:   regular  Vitals Entered By: Randell Loop CMA (March 05, 2010 3:34 PM)  O2 Sat at Rest %:  96 O2 Flow:  Room air CC: 21 month ROV & review of mult medical problems... Is Patient Diabetic? Yes Pain Assessment Patient in pain? no      Comments meds updated today with pt   Primary Care Provider:  nadel   CC:  21 month ROV & review of mult medical problems....  History of Present Illness: 70 y/o WF here for a follow up visit... she has multiple medical problems including HBP, Chol, DM, Obesity, etc...   ~  March 05, 2010:      [name] saw DrEllison for her DM 6/11> she stopped her Glimep on her own due to ?hypoglycemia ("it brings me down too quick") & she wanted to stay on the Metformin & Januvia alone; her BS has been >200 at home and A1c  ~8 on checks last yr;  she is not on diet ("I crave sweets") & not exercising;  she is asking about Victoza to help her lose wt & we discussed a comprehensive program of diet (w/ nutritional counselling), exercise, & meds= Metformin500Bid + Victoza 0.6 ==> 1.2mg  daily.    She notes continue Ortho prob w/ bursitis etc; had shot in right hip recently & may need surg she says.    She notes that she switched Gastroenterologists to "the oriental lady" meaning ?DrNatt-Mann & apparently had EGD & Colonoscopy ?several yrs ago, we don't have these reports, nothing avail in [place], she states "several polyps removed".    Current Problems:   ALLERGIC RHINITIS (ICD-477.9) - uses OTC meds Prn...  Hx of ASTHMATIC BRONCHITIS, ACUTE (ICD-466.0) - uses NEB w/ Albuterol Prn & she reports breathing well w/o acute  exac...  HYPERTENSION (ICD-401.9) - currently on AMLODIPINE 10mg /d, BENAZEPRIL 40mg /d (and HCTZ 25mg  + KCl which she takes just as needed for swelling)... BP= 132/66 today and not checking BP at home... denies HA, visual changes, CP, palipit, dizziness, syncope, dyspnea, edema, etc.   ~  labs 2/12 showed norm Electrolytes, Renal, blood count etc...  Hx of CHEST PAIN, ATYPICAL (ICD-786.59) - she was seen in the ER by DrPulsipher in 2004 w/ atypical CP... her daughter, Arline Asp, is an Nutritional therapist...  ~  NuclearStressTest 9/04 was normal- no scar or ischemia, EF= 73%...  HYPERCHOLESTEROLEMIA (ICD-272.0) - on SIMVASTATIN 80mg /d, but not on much of a diet... we discussed low chol/ low fat diet and the need to change the Simva80 to CRESTOR 40mg /d... also asked to take FISH OIL daily.  ~  previous labs showed cholesterol as high as 350 in the past... prev on numerous statin meds.  ~  FLP 11/08 ?off Lova40 showed TChol 252, TG 236, HDL 49, LDL 163  ~  FLP 10/09 on Simva80 showed TChol 212, TG 124, HDL 64, LDL 119... reminded to take meds regularly.  ~  FLP 4/10 on Simva80 showed TChol 206, TG 95, HDL  71, LDL 112... I checked w/ her pharm- filled Simva80 #30 on 2/24 & 4/20.  ~  FLP 2/12 on ?Simva80 showed TChol 240, TG 95, HDL 75, LDL 154... rec switch to Cres40.  DIABETES MELLITUS (ICD-250.00) - prev on METFORMIN 500mg Bid & Glimepiride 2mg /d (she stopped on her own "it brings me down to quick") so JANUVIA 100mg /d added by DrEllison... pt not on diet, not exercising, & "I crave sweets"  ~  labs 11/08 (wt= 211#) showed BS= 129, HgA1c= 6.6.Marland Kitchen.  ~  labs 10/09 (wt= 216#) showed BS= 109, HgA1c= 6.7...  ~  labs 4/10 (wt= 211#) showed BS= 127, HgA1c= 7.3.Marland Kitchen. rec> get on diet, incr Metform Bid.  ~  labs 8/10 (wt=213#) showed BS= 154, A1c= 7.1  ~  labs 2/12 (wt=203#) showed BS= 167, A1c= 8.0... Januvia ch to VICTOZA 0.6 =>1.2mg  daily.  OBESITY (ICD-278.00) - her weight has been betw 200-215 x yrs... she is 5\' 8"   tall for a BMI ~ 32-33... she says her weight is much lower on her home scales... discussed diet + exercise program required to lose weight...   GERD (ICD-530.81) - on OMEPRAZOLE 20mg /d... she had EGD 11/04 by DrStark w/ 4cmHH, stricture- dilated, gastritis & polyp... ? of COLON POLYPS ? we don't have records from [facility]...  ~  she had a normal colonoscopy 11/04 from DrStark x tortuous colon... Rx w/ Levsin Prn.  ~  she reports switching gastroenterologist ?DrNatt-Mann w/ EGD & Colon done ?when- she reports several polyps removed.  Hx of URINARY INCONTINENCE (ICD-788.30) - she had a full eval 1/07 by [name] for Urology, and is followed by her GYN, [name]...  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 uses ACYCLOVIR 400mg  Tid for outbreaks.  DEGENERATIVE JOINT DISEASE (ICD-715.90) - she gets freq shots for bursitis from Ortho. Hx of BACK PAIN, LUMBAR (ICD-724.2) - with prev lumbar laminectomy by Raphael Gibney...  ANXIETY (ICD-300.00) - on ALPRAZOLAM 0.5mg  Tid Prn... DEPRESSION (ICD-311) - on ZOLOFT 50mg /d...   Preventive Screening-Counseling & Management  Alcohol-Tobacco     Smoking Status: never  Allergies: 1)  ! Rocephin 2)  ! Cleocin 3)  ! Sulfa 4)  ! Keflex 5)  ! Percocet  Comments:  Nurse/Medical Assistant: The patient's medications and allergies were reviewed with the patient and were updated in the Medication and Allergy Lists.  Past History:  Past Medical History: ALLERGIC RHINITIS (ICD-477.9) Hx of ASTHMATIC BRONCHITIS, ACUTE (ICD-466.0) HYPERTENSION (ICD-401.9) Hx of CHEST PAIN, ATYPICAL (ICD-786.59) HYPERCHOLESTEROLEMIA (ICD-272.0) DIABETES MELLITUS (ICD-250.00) OBESITY (ICD-278.00) GERD (ICD-530.81) ? 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 mother died at 38 from copd, also hasd arthritis, emphysema father died at 41 from brain tumor sibling 1 died at 43, heart disease, diabetes sibling 2 living with back problems, allergies family history of prostate cancer dm:  brother (deceased)  Social History: Reviewed history from 06/14/2009 and no changes required. never smoked positive for second-hand smoke exposure no exercise no caffeine married 3 children does not work outside the home  Review of Systems  The patient complains of dyspnea on exertion, peripheral edema, gas/bloating, indigestion/heartburn, joint pain, muscle cramps, arthritis, itching, and dryness.  The patient denies fever, chills, sweats, anorexia, fatigue, weakness, malaise, weight loss, sleep disorder, blurring, diplopia, eye irritation, eye discharge, vision loss, eye pain, photophobia, earache, ear discharge, tinnitus, decreased hearing, nasal congestion, nosebleeds, sore throat, hoarseness, chest pain, palpitations, syncope, orthopnea, PND, cough, dyspnea at rest, excessive sputum, hemoptysis, wheezing, pleurisy, nausea, vomiting, diarrhea, constipation, change in bowel habits, abdominal pain, melena, hematochezia, jaundice, dysphagia, odynophagia, dysuria, hematuria, urinary frequency, urinary hesitancy, nocturia, incontinence, back pain, joint swelling, muscle weakness, stiffness, sciatica, restless legs, leg pain at night, leg pain with exertion, rash, suspicious lesions, paralysis, paresthesias, seizures, tremors, vertigo, transient blindness, frequent falls, frequent headaches,  difficulty walking, depression, anxiety, memory loss, confusion, cold intolerance, heat intolerance, polydipsia, polyphagia, polyuria, unusual weight change, abnormal bruising, bleeding, enlarged lymph nodes, urticaria, allergic rash, hay fever, and recurrent infections.    Physical Exam  Additional Exam:  WD, Overweight, 71 y/o WF in NAD... GENERAL:  Alert & oriented; pleasant & cooperative... she is sl anxious... HEENT:  [place]/AT, EOM-wnl, PERRLA, EACs-clear, TMs-wnl, NOSE-clear, THROAT-clear & wnl. NECK:  Supple w/ fairROM; no JVD; normal carotid impulses w/o bruits; no thyromegaly or nodules palpated; no lymphadenopathy. CHEST:  Clear to P & A; without wheezes/ rales/ or rhonchi. HEART:  Regular Rhythm; without murmurs/ rubs/ or gallops. ABDOMEN:  Obese, soft & nontender; normal bowel sounds; no organomegaly or masses detected. EXT: without deformities, mod arthritic changes; no varicose veins/ venous insuffic/ or edema. NEURO:  CN's intact; motor testing normal; sensory testing normal; gait normal & balance OK. DERM:  No lesions noted; no rash etc...    Impression & Recommendations:  Problem # 1:  HYPERTENSION (ICD-401.9) BP controlled>  continue same meds, she wants to keep the HCT/ KCl for Prnuse in the summer "when I'm swelling"... Her updated medication list for this problem includes:    Amlodipine Besylate 10 Mg Tabs (Amlodipine besylate) .Marland Kitchen... Take 1 tab daily...    Benazepril Hcl 40 Mg Tabs (Benazepril hcl) .Marland Kitchen... 1 once daily    Hydrochlorothiazide 25 Mg Tabs (Hydrochlorothiazide) .Marland Kitchen... Take 1/2 to 1 tab by mouth once daily as needed for swelling...  Problem # 2:  HYPERCHOLESTEROLEMIA (ICD-272.0) She is not controlled on the current 80mg  dose of Simvastatin & compliance is questioned... in any event she would need to decr to 40mg  top dose & so we decided to switch to CRESTOR 40mg /d> pt encouraged to take it every day!!! We will also set up Nutrition consult for diet  instructions. Her updated medication list for this problem includes:    Crestor 40 Mg Tabs (Rosuvastatin calcium) .Marland Kitchen... Take 1 tab by mouth once daily for cholesterol Orders: Nutrition Referral (Nutrition)  Problem # 3:  DIABETES MELLITUS (ICD-250.00) Control is poor & she hasn't been cooperative w/ diet or exercise "I crave sweets" & we will refer to [facility]... she would also like to try VICTOZA (and we will stop the Januvia), continue the Metformin 500mg Bid & encourage compliance, exercise, etc... Her updated medication list for this problem includes:    Benazepril Hcl 40 Mg Tabs (Benazepril hcl) .Marland Kitchen... 1 once daily    Metformin Hcl 500 Mg Xr24h-tab (Metformin hcl) .Marland Kitchen... 1 tab two times a day    Victoza 18 Mg/69ml Soln (Liraglutide) ..... Inject 0.6mg  Sq once daily for 10 days, then increase to 1.2mg  Sq thereafter... Orders: Nutrition Referral (Nutrition)  Problem # 4:  OBESITY (ICD-278.00) We  discussed diet, exercise, & wt reduction... Orders: Nutrition Referral (Nutrition)  Problem # 5:  GI >>> Now followed by Zola Button (she thinks) & she will request records be sent to [place] for review...  Problem # 6:  Hx of URINARY INCONTINENCE (ICD-788.30) Assessment: Unchanged Followed by GYN- [name], & Urology- [name]...  Problem # 7:  ANXIETY & DEPRESSION>>> She wishes to continue the Zoloft daily & the Xanax just Prn...  Problem # 8:  OTHER MEDICAL ISSUES AS NOTED>>> Recent fasting labs reviewed w/ pt...  Complete Medication List: 1)  Albuterol Sulfate (2.5 Mg/67ml) 0.083% Nebu (Albuterol sulfate) .... As needed 2)  Promethazine-codeine 6.25-10 Mg/4ml Syrp (Promethazine-codeine) .Marland Kitchen.. 1 tsp by mouth every 6 hours as needed cough 3)  Amlodipine Besylate 10 Mg Tabs (Amlodipine besylate) .... Take 1 tab daily.Marland KitchenMarland Kitchen 4)  Benazepril Hcl 40 Mg Tabs (Benazepril hcl) .Marland Kitchen.. 1 once daily 5)  Hydrochlorothiazide 25 Mg Tabs (Hydrochlorothiazide) .... Take 1/2 to 1 tab by mouth  once daily as needed for swelling... 6)  Klor-con M20 20 Meq Cr-tabs (Potassium chloride crys cr) .... Take 1 tablet by mouth once a day as needed (take w/ the hctz) 7)  Crestor 40 Mg Tabs (Rosuvastatin calcium) .... Take 1 tab by mouth once daily for cholesterol 8)  Metformin Hcl 500 Mg Xr24h-tab (Metformin hcl) .Marland Kitchen.. 1 tab two times a day 9)  Victoza 18 Mg/72ml Soln (Liraglutide) .... Inject 0.6mg  subcutaneously once daily for 10 days, then increase to 1.2mg  subcutaneously thereafter... 10)  Omeprazole 20 Mg Cpdr (Omeprazole) .Marland Kitchen.. 1 by mouth once daily 11)  Naproxen 250 Mg Tabs (Naproxen) .... As needed for bursitis pain in hip 12)  Vitamin D 2000 Unit Tabs (Cholecalciferol) .... Take 1 tablet by mouth once a day 13)  Zoloft 50 Mg Tabs (Sertraline hcl) .Marland Kitchen.. 1 by mouth once daily 14)  Xanax 0.5 Mg Tabs (Alprazolam) .... 1/2 - 1 by mouth three times a day as needed nerves 15)  Acyclovir 400 Mg Tabs (Acyclovir) .Marland Kitchen.. 1 by mouth three times a day for 5 days as needed at onset of symptoms  Patient Instructions: 1)  Today we updated your med list- see below.... 2)  We refilled your meds for 2012... 3)  We changed the Simvastatin to CRESTOR (one tab daily for cholesterol)... 4)  We changed the Januvia to VICTOZA (one injection daily for diabetes- as instructed)... THIS CAN HELP YOU LOSE WEIGHT 5)  We will set up a referral to the [facility] for dietary counselling... 6)  Call for any questions.Marland KitchenMarland Kitchen 7)  Let's plan a follow visit in  6weeks to monitor your progress... Prescriptions: ACYCLOVIR 400 MG TABS (ACYCLOVIR) 1 by mouth three times a day for 5 days as needed at onset of symptoms  #60 x 6   Entered and Authorized by:   Michele Mcalpine MD   Signed by:   Michele Mcalpine MD on 03/05/2010   Method used:   Print then Give to Patient   RxID:   [id number] XANAX 0.5 MG  TABS (ALPRAZOLAM) 1/2 - 1 by mouth three times a day as needed nerves  #90 x 6   Entered and Authorized by:   Michele Mcalpine MD   Signed by:   Michele Mcalpine MD on 03/05/2010   Method used:   Print then Give to Patient   RxID:   [id number] ZOLOFT 50 MG  TABS (SERTRALINE HCL) 1 by mouth once daily  #30 x 12   Entered and  Authorized by:   Michele Mcalpine MD   Signed by:   Michele Mcalpine MD on 03/05/2010   Method used:   Print then Give to Patient   RxID:   [id number] OMEPRAZOLE 20 MG  CPDR (OMEPRAZOLE) 1 by mouth once daily  #30 x 12   Entered and Authorized by:   Michele Mcalpine MD   Signed by:   Michele Mcalpine MD on 03/05/2010   Method used:   Print then Give to Patient   RxID:   [id number] METFORMIN HCL 500 MG XR24H-TAB (METFORMIN HCL) 1 tab two times a day  #60 x 12   Entered and Authorized by:   Michele Mcalpine MD   Signed by:   Michele Mcalpine MD on 03/05/2010   Method used:   Print then Give to Patient   RxID:   [id number] CRESTOR 40 MG TABS (ROSUVASTATIN CALCIUM) take 1 tab by mouth once daily for cholesterol  #30 x 12   Entered and Authorized by:   Michele Mcalpine MD   Signed by:   Michele Mcalpine MD on 03/05/2010   Method used:   Print then Give to Patient   RxID:   [id number] KLOR-CON M20 20 MEQ  CR-TABS (POTASSIUM CHLORIDE CRYS CR) Take 1 tablet by mouth once a day as needed (take w/ the HCTZ)  #30 x 12   Entered and Authorized by:   Michele Mcalpine MD   Signed by:   Michele Mcalpine MD on 03/05/2010   Method used:   Print then Give to Patient   RxID:   [id number] HYDROCHLOROTHIAZIDE 25 MG TABS (HYDROCHLOROTHIAZIDE) take 1/2 to 1 tab by mouth once daily as needed for swelling...  #30 x 12   Entered and Authorized by:   Michele Mcalpine MD   Signed by:   Michele Mcalpine MD on 03/05/2010   Method used:   Print then Give to Patient   RxID:   [id number] BENAZEPRIL HCL 40 MG TABS (BENAZEPRIL HCL) 1 once daily  #30 x 12   Entered and Authorized by:   Michele Mcalpine MD   Signed by:   Michele Mcalpine MD on 03/05/2010   Method used:   Print then Give to Patient   RxID:    [id number] AMLODIPINE BESYLATE 10 MG TABS (AMLODIPINE BESYLATE) take 1 tab daily...  #30 x 12   Entered and Authorized by:   Michele Mcalpine MD   Signed by:   Michele Mcalpine MD on 03/05/2010   Method used:   Print then Give to Patient   RxID:   [id number] VICTOZA 18 MG/3ML SOLN (LIRAGLUTIDE) inject 0.6mg  subcutaneously once daily for 10 days, then increase to 1.2mg  subcutaneously thereafter...  #1 mo supply x 6   Entered and Authorized by:   Michele Mcalpine MD   Signed by:   Michele Mcalpine MD on 03/05/2010   Method used:   Print then Give to Patient   RxID:   [id number]    Orders Added: 1)  Est. Patient Level V [09381] 2)  Nutrition Referral [Nutrition]

## 2010-03-20 NOTE — Assessment & Plan Note (Signed)
Summary: f/u ov   Vital Signs:  Patient profile:   70 year old female Menstrual status:  hysterectomy Height:      68 inches (172.72 cm) Weight:      198 pounds (90.00 kg) BMI:     30.21 O2 Sat:      96 % on Room air Temp:     97.7 degrees F (36.50 degrees C) oral Pulse rate:   77 / minute Pulse rhythm:   regular BP sitting:   120 / 70  (left arm) Cuff size:   regular  Vitals Entered By: Brenton Grills CMA Duncan Dull) (March 10, 2010 3:52 PM)  O2 Flow:  Room air CC: Follow up on DM/discuss Zostavax/aj Is Patient Diabetic? Yes Comments Pt has never had pnuemovax, and is due for mammogram, pt no longer gets yearly paps (hysterectomy)     Menstrual Status hysterectomy   Primary Provider:  nadel   CC:  Follow up on DM/discuss Zostavax/aj.  History of Present Illness: pt states she feels well in general.  no cbg record, but states cbg's are somrtimes over 200.  she tolerates meds well.    Current Medications (verified): 1)  Albuterol Sulfate (2.5 Mg/26ml) 0.083%  Nebu (Albuterol Sulfate) .... As Needed 2)  Promethazine-Codeine 6.25-10 Mg/9ml Syrp (Promethazine-Codeine) .Marland Kitchen.. 1 Tsp By Mouth Every 6 Hours As Needed Cough 3)  Amlodipine Besylate 10 Mg Tabs (Amlodipine Besylate) .... Take 1 Tab Daily.Marland KitchenMarland Kitchen 4)  Benazepril Hcl 40 Mg Tabs (Benazepril Hcl) .Marland Kitchen.. 1 Once Daily 5)  Hydrochlorothiazide 25 Mg Tabs (Hydrochlorothiazide) .... Take 1/2 To 1 Tab By Mouth Once Daily As Needed For Swelling... 6)  Klor-Con M20 20 Meq  Cr-Tabs (Potassium Chloride Crys Cr) .... Take 1 Tablet By Mouth Once A Day As Needed (Take W/ The Hctz) 7)  Crestor 40 Mg Tabs (Rosuvastatin Calcium) .... Take 1 Tab By Mouth Once Daily For Cholesterol 8)  Metformin Hcl 500 Mg Xr24h-Tab (Metformin Hcl) .Marland Kitchen.. 1 Tab Two Times A Day 9)  Victoza 18 Mg/74ml Soln (Liraglutide) .... Inject 0.6mg  Subcutaneously Once Daily For 10 Days, Then Increase To 1.2mg  Subcutaneously Thereafter... 10)  Omeprazole 20 Mg  Cpdr (Omeprazole) .Marland Kitchen.. 1  By Mouth Once Daily 11)  Naproxen 250 Mg Tabs (Naproxen) .... As Needed For Bursitis Pain in Hip 12)  Vitamin D 2000 Unit Tabs (Cholecalciferol) .... Take 1 Tablet By Mouth Once A Day 13)  Zoloft 50 Mg  Tabs (Sertraline Hcl) .Marland Kitchen.. 1 By Mouth Once Daily 14)  Xanax 0.5 Mg  Tabs (Alprazolam) .... 1/2 - 1 By Mouth Three Times A Day As Needed Nerves 15)  Acyclovir 400 Mg Tabs (Acyclovir) .Marland Kitchen.. 1 By Mouth Three Times A Day For 5 Days As Needed At Onset of Symptoms  Allergies (verified): 1)  ! Rocephin 2)  ! Cleocin 3)  ! Sulfa 4)  ! Keflex 5)  ! Percocet  Past History:  Past Medical History: Last updated: 03/05/2010 ALLERGIC RHINITIS (ICD-477.9) Hx of ASTHMATIC BRONCHITIS, ACUTE (ICD-466.0) HYPERTENSION (ICD-401.9) Hx of CHEST PAIN, ATYPICAL (ICD-786.59) HYPERCHOLESTEROLEMIA (ICD-272.0) DIABETES MELLITUS (ICD-250.00) OBESITY (ICD-278.00) GERD (ICD-530.81) ? 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    Physical Exam  General:  normal appearance.   Pulses:  dorsalis pedis intact bilat.    Extremities:  no deformity.  no ulcer on the feet.  feet  are of normal color and temp.  no edema Neurologic:  sensation is intact to touch on the feet    Impression & Recommendations:  Problem # 1:  DIABETES MELLITUS (ICD-250.00) HgbA1C: 8.0 (03/05/2010) needs increased rx  Medications Added to Medication List This Visit: 1)  Metformin Hcl 500 Mg Xr24h-tab (Metformin hcl) .... 2 tabs two times a day 2)  Welchol 625 Mg Tabs (Colesevelam hcl) .... 3 tabs once daily  Other Orders: Zoster (Shingles) Vaccine Live [phone]) Admin 1st Vaccine (60454) Est. Patient Level III (09811)  Patient Instructions: 1)  check your blood sugar 2 times a day.  vary the time of day when  you check, between before the 3 meals, and at bedtime.  also check if you have symptoms of your blood sugar being too high or too low.  please keep a record of the readings and bring it to your next appointment here.  2)  increase metformin to 2x500 mg two times a day. 3)  add welchol 3x625 mg once daily. 4)  Please schedule a follow-up appointment in 3 months, with a1c priot 250.00. Prescriptions: WELCHOL 625 MG TABS (COLESEVELAM HCL) 3 tabs once daily  #150 x 11   Entered and Authorized by:   Minus Breeding MD   Signed by:   Minus Breeding MD on 03/10/2010   Method used:   Electronically to        [facility]  [phone]* (retail)       [address]       [place], [place]  [address]       Ph: [phone] or [phone]       Fax: [phone]   RxID:   [id number] METFORMIN HCL 500 MG XR24H-TAB (METFORMIN HCL) 2 tabs two times a day  #120 x 11   Entered and Authorized by:   Minus Breeding MD   Signed by:   Minus Breeding MD on 03/10/2010   Method used:   Electronically to        [facility]  [phone]* (retail)       [address]       [place], [place]  [address]       Ph: [phone] or [phone]       Fax: [phone]   RxID:   [id number]    Orders Added: 1)  Zoster (Shingles) Vaccine Live [90736] 2)  Admin 1st Vaccine [90471] 3)  Est. Patient Level III [76160]   Immunizations Administered:  Zostavax # 1:    Vaccine Type: Zostavax    Site: right deltoid    Mfr: Merck    Dose: 0.9ml    Route: Celeste    Given by: Brenton Grills CMA (AAMA)    Exp. Date: 09/20/2010    Lot #: [id number]    VIS given: 11/14/04 given March 10, 2010.   Immunizations Administered:  Zostavax # 1:    Vaccine Type: Zostavax    Site: right deltoid    Mfr: Merck    Dose: 0.80ml    Route: [place]    Given by: Brenton Grills CMA (AAMA)    Exp. Date: 09/20/2010    Lot #: [id number]    VIS given: 11/14/04 given March 10, 2010.    Preventive Care Screening  Colonoscopy:    Date:  02/03/2007    Results:  done

## 2010-03-31 ENCOUNTER — Telehealth: Payer: Self-pay | Admitting: Pulmonary Disease

## 2010-04-10 NOTE — Progress Notes (Signed)
Summary: prescription  Phone Note Call from Patient   Caller: Patient Call For: [name] Summary of Call: Patient phoned and would like to know if [name] would call her in a prescription for Acylovir ornmint to [facility] on [address]. She can be reached at [phone] Initial call taken by: Vedia Coffer,  March 31, 2010 9:21 AM  Follow-up for Phone Call        Called, spoke with pt.  States she has 1 blister on left hip.  Already taking Acyclovir tabs and requesting acyclovir ointment to help "dry up" the area.  Allergies (verified):  ! ROCEPHIN ! CLEOCIN ! SULFA ! KEFLEX ! PERCOCET  [facility] [address]. Kriste Basque, pls advise.  Thanks!  Follow-up by: Gweneth Dimitri RN,  March 31, 2010 12:31 PM  Additional Follow-up for Phone Call Additional follow up Details #1::        per SN---yes we can refill the zoviarax ointment  #1 tube--apply as directed and refill as needed.  med has been sent to pts pharamcy and pt is aware Randell Loop CMA  March 31, 2010 3:40 PM      New/Updated Medications: ZOVIRAX 5 % OINT (ACYCLOVIR) apply as directed Prescriptions: ZOVIRAX 5 % OINT (ACYCLOVIR) apply as directed  #1 x 6   Entered by:   Randell Loop CMA   Authorized by:   Michele Mcalpine MD   Signed by:   Randell Loop CMA on 03/31/2010   Method used:   Electronically to        [facility]  [phone]* (retail)       [address]       [place], [place]  [address]       Ph: [phone] or [phone]       Fax: [phone]   RxID:   [id number]

## 2010-04-16 ENCOUNTER — Ambulatory Visit: Payer: MEDICARE | Admitting: Pulmonary Disease

## 2010-04-17 ENCOUNTER — Telehealth: Payer: Self-pay | Admitting: Pulmonary Disease

## 2010-04-21 ENCOUNTER — Ambulatory Visit: Payer: MEDICARE

## 2010-04-22 NOTE — Progress Notes (Signed)
Summary: nos appt  Phone Note Call from Patient   Caller: [email or web address]  Call For: Billy Rocco Summary of Call: LMTCB x2 to rsc nos from 3/14. Initial call taken by: Darletta Moll,  April 17, 2010 3:43 PM

## 2010-05-09 LAB — CBC
HCT: 45.3 % (ref 36.0–46.0)
MCHC: 33.5 g/dL (ref 30.0–36.0)
Platelets: 252 10*3/uL (ref 150–400)
RBC: 5.07 MIL/uL (ref 3.87–5.11)

## 2010-05-09 LAB — BASIC METABOLIC PANEL
BUN: 8 mg/dL (ref 6–23)
Calcium: 9.6 mg/dL (ref 8.4–10.5)
Chloride: 102 mEq/L (ref 96–112)
Creatinine, Ser: 0.74 mg/dL (ref 0.4–1.2)
GFR calc Af Amer: >60 mL/min (ref >60)
Glucose, Bld: 171 mg/dL — ABNORMAL HIGH (ref 70–99)
Potassium: 3.9 mEq/L (ref 3.5–5.1)

## 2010-05-09 LAB — GLUCOSE, CAPILLARY
Glucose-Capillary: 146 mg/dL — ABNORMAL HIGH (ref 70–99)
Glucose-Capillary: 173 mg/dL — ABNORMAL HIGH (ref 70–99)
Glucose-Capillary: 182 mg/dL — ABNORMAL HIGH (ref 70–99)
Glucose-Capillary: 182 mg/dL — ABNORMAL HIGH (ref 70–99)

## 2010-06-09 ENCOUNTER — Other Ambulatory Visit: Payer: MEDICARE

## 2010-06-09 ENCOUNTER — Other Ambulatory Visit: Payer: Self-pay | Admitting: Endocrinology

## 2010-06-09 DIAGNOSIS — E119 Type 2 diabetes mellitus without complications: Secondary | ICD-10-CM

## 2010-06-11 ENCOUNTER — Ambulatory Visit: Payer: MEDICARE | Admitting: Endocrinology

## 2010-06-11 DIAGNOSIS — Z0289 Encounter for other administrative examinations: Secondary | ICD-10-CM

## 2010-06-17 NOTE — Assessment & Plan Note (Signed)
[facility]                             PULMONARY OFFICE NOTE   NAME:ELLINGTONArlenis, [name]                  MRN:          [id number]  DATE:09/28/2006                            DOB:          1940/03/25    HISTORY OF PRESENT ILLNESS:  The patient is a 70 year old white female  patient of Dr.  Jodelle Green who has a known history of asthmatic bronchitis,  hypertension, hyperlipidemia and osteoarthritis who presents today for  an acute office visit. The patient complains over the last week that she  has had progressively worsening productive cough with thick yellow  sputum. The symptoms have worsened over the last three days. She denies  any fever, hemoptysis, orthopnea, PND or leg swelling. The patient has  since last visit has recently had a rectocele repair in June of this  year.   PAST MEDICAL HISTORY:  Reviewed.   CURRENT MEDICATIONS:  Reviewed.   PHYSICAL EXAMINATION:  The patient is a pleasant female in no acute  distress. She is afebrile with stable vital signs. O2 saturation is 96%  on room air.  HEENT: Unremarkable.  NECK: Supple without cervical adenopathy. No JVD.  LUNGS: Lung sounds are clear without any wheezing or crackles.  CARDIAC: Regular rate and rhythm.  ABDOMEN: Soft and nontender.  EXTREMITIES: Warm without edema.   IMPRESSION/PLAN:  Acute tracheal bronchitis. The patient has multiple  drug allergies including ROCEPHIN, CLEOCIN, SULFA AND KEFLEX. The  patient has been recommended to begin Avelox 400 mg x5 days and add in  Mucinex D.M. twice daily. The patient will return back with Dr.  Kriste Basque  in one month for routine checkup and followup labs.      Rubye Oaks, NP  Electronically Signed      [name], MD  Electronically Signed   TP/MedQ  DD: 09/28/2006  DT: 09/28/2006  Job #: [id number]

## 2010-06-17 NOTE — Assessment & Plan Note (Signed)
[facility]                             PULMONARY OFFICE NOTE   NAME:Hailey, [name]                  MRN:          [id number]  DATE:12/14/2006                            DOB:          [birth year]    HISTORY AND PHYSICAL:  The patient is a 70 year old patient of Dr.  Jodelle Green who has a known history of asthmatic bronchitis, hyperlipidemia,  hypertension, presents today for an acute office visit.  The patient  complains of a 2-week history of nasal congestion, sore throat, chills,  and productive cough with thick yellow sputum.  The patient recently  underwent endoscopy where she states she had a sore throat and  hoarseness for about a week that resolved and then shortly after that  she started having cough and wheezing.  She denies any hemoptysis,  orthopnea, PND, leg swelling.   PAST MEDICAL HISTORY:  Reviewed.   CURRENT MEDICATIONS:  Reviewed.   PHYSICAL EXAMINATION:  The patient is a pleasant female in no acute  distress.  She is afebrile with stable vital signs.  O2 saturation is  98% on room air.  HEENT:  Nasal mucosa with some mild erythema.  Nontender sinuses.  Posterior pharynx is clear.  NECK:  Supple without cervical adenopathy.  No JVD.  LUNG SOUNDS:  Reveal coarse breath sounds with a few expiratory wheezes.  CARDIAC:  Regular rate.  ABDOMEN:  Soft and nontender.  EXTREMITIES:  Warm without any edema.   IMPRESSION AND PLAN:  Acute asthmatic bronchitis exacerbation.  The  patient was given Avelox x5 days.  Mucinex DM twice daily.  Prednisone  taper over the next week.  The patient is return back with Dr. Kriste Basque as  scheduled or sooner if needed.      Rubye Oaks, NP  Electronically Signed      [name], MD  Electronically Signed   TP/MedQ  DD: 12/14/2006  DT: 12/14/2006  Job #: [id number]

## 2010-06-17 NOTE — H&P (Signed)
NAME:  [name]           ACCOUNT NO.:  [id number]   MEDICAL RECORD NO.:  [id number]          PATIENT TYPE:  AMB   LOCATION:  SDC                           FACILITY:  [facility]   PHYSICIAN:  Crist Fat. Rivard, M.D. DATE OF BIRTH:  [birth year]   DATE OF ADMISSION:  DATE OF DISCHARGE:                              HISTORY & PHYSICAL   HISTORY OF PRESENT ILLNESS:  [name] is a 70 year old married  white female, para 3-0-2-3, who is status post hysterectomy and bladder  surgery, who presents for a posterior repair because of a symptomatic  rectocele.  For almost a year, the patient reports feeling a bulge in  her vaginal region and has experienced frequent bladder infections.  She  goes on to report a sensation of pressure in her lower pelvic region,  along with frequent loose bowel movements.  She denies any hematochezia,  any fever, vaginitis symptoms, constipation, or pelvic pain.  She does  admit, however, to occasional nausea.  After being given both medical  and surgical management options for her symptoms, the patient has  decided to proceed with surgical intervention for her symptomatic  rectocele.   PAST MEDICAL HISTORY:   OB HISTORY:  Gravida 5, para 3-0-2-3.   GYN HISTORY:  Menarche 70 years old.  The patient has had a  hysterectomy.  She denies any history of sexually transmitted diseases  or abnormal Pap smears.  Her last normal Pap smear was 05/2006.   MEDICAL HISTORY:  1. Asthma.  2. Overactive bladder.  3. Hypertension.  4. Diabetes mellitus.  5. Arthritis.  6. Bursitis.  7. Gastroesophageal reflux disease.   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  The patient has undergone multiple breast  biopsies, all of which have been benign and occurred  bilaterally.   She denies any problems with anesthesia.  She did undergo a blood  transfusion in 1963.   FAMILY HISTORY:  Hypertension, cardiovascular disease, emphysema,  diabetes mellitus, and prostate cancer.   SOCIAL HISTORY:  The patient is married.   HABITS:  She does not use alcohol or tobacco.   CURRENT MEDICATIONS:  1. Metformin 500 mg twice daily.  2. Vitamin B12 daily.  3. Lotrel 5/20 daily.  4. Aspirin 81 mg daily.  5. Protonix 40 mg daily.  6. Lovastatin 40 mg daily.  7. Ranitidine 300 mg daily.  8. Tylenol P.M. as needed.   ALLERGIES:  CLEOCIN which causes her to have hives and KEFLEX which also  causes her to have hives.   REVIEW OF SYSTEMS:  The patient experiences nocturia, urinary  incontinence, diarrhea.  She does wear glasses.  She denies, however,  chest pain, shortness of breath, headache, vision changes and, except as  mentioned in history of present illness, the patient's review of systems  is negative.   PHYSICAL EXAMINATION:  VITAL SIGNS:  Blood pressure 138/80, weight 206  pounds.  Height 5  feet 7-3/4 inches.  NECK:  Supple.  There are no masses, thyromegaly, or cervical  adenopathy.  HEART:  Regular rate and rhythm.  LUNGS:  Clear.  BACK:  No CVA tenderness.  ABDOMEN:  No tenderness, masses, or organomegaly.  EXTREMITIES:  No clubbing, cyanosis or edema.  PELVIC:  EGBUS is normal.  Vagina reveals a 2 to 3/4 rectocele with a  questionable enterocele.  Uterus and cervix are surgically absent.  Adnexa, no masses or tenderness.   IMPRESSION:  Symptomatic rectocele.   DISPOSITION:  A discussion was held with the patient regarding the  indications for her procedure, along with this list which include but  are not limited to reaction to anesthesia, damage to adjacent organs,  infection, excessive bleeding.  The patient has consented to proceed  with a posterior repair at [facility] [place] on 07/07/2006,  at 9:30 a.m.       [place] J. Adline Peals.      Crist Fat Rivard, M.D.  Electronically Signed    EJP/MEDQ  D:  07/05/2006  T:  07/05/2006  Job:  [id number]

## 2010-06-17 NOTE — Op Note (Signed)
NAME:  [name], [name]           ACCOUNT NO.:  [id number]   MEDICAL RECORD NO.:  [id number]          PATIENT TYPE:  AMB   LOCATION:  SDC                           FACILITY:  [facility]   PHYSICIAN:  Crist Fat. Rivard, M.D. DATE OF BIRTH:  09-Jul-1940   DATE OF PROCEDURE:  07/07/2006  DATE OF DISCHARGE:                               OPERATIVE REPORT   PREOPERATIVE DIAGNOSIS:  Rectocele.   POSTOPERATIVE DIAGNOSIS:  Rectocele plus enterocele.   ANESTHESIA:  General Dr. Jean Rosenthal.   PROCEDURE:  Posterior repair and cure of enterocele.   SURGEON:  Crist Fat. Rivard, M.D.   ASSISTANTMarquis Lunch. Lowell Guitar, P.A.   DESCRIPTION OF PROCEDURE:  After being informed of the planned procedure  with possible complications including bleeding, infection and injury to  bowels, informed consent was obtained.  The patient is taken to OR #7,  given general anesthesia with endotracheal intubation without  complication.  She is placed in the lithotomy position, prepped and  draped in a sterile fashion and a Foley catheter is inserted in her  bladder.  GYN exam reveals a grade 3 rectocele with the possibility of  enterocele and a grade 1 cystocele. The perineal forceps is grasped with  two Allis forceps with a distance of 2 cm in between them and the  perineum is infiltrated with lidocaine 1% epinephrine 1:200,000.  We  also infiltrate the posterior vaginal wall on the full length of our  planned procedure.  We then excised elliptical portion of perineal skin  and vestibular scan with sharp knife.  This allows [place] to grasp the  posterior vaginal wall with Allis forceps and to undermine in the  midline fashion.  The posterior vaginal wall from the prerectal fascia  and this was performed until we are about 2 cm from the apex of the  vagina.  Now sharply and bluntly we dissect the prerectal fascia away  from the posterior vaginal wall until we can completely reduce the  rectocele.  During that process we do note a  small enterocele at the  vaginal apex. The sac of the enterocele is identified and dissected away  from the vaginal mucosa and is plicated with two pursestring sutures of  3-0 Vicryl.  We are now able to plicate the prerectal fascia to correct  the rectocele using U stitches of 2-0 Vicryl in two different planes.  This accomplishes complete correction of rectocele.  The excess vaginal  mucosa is excised and the vaginal mucosa is closed with a running lock  suture of 3-0 Vicryl.  The perineal muscles are approximated with two 3-  0 Vicryl sutures and the skin of the perineum is closed with  subcuticular suture of 3-0 Vicryl.  At the end of the procedure we have  a normal vaginal length and the diameter that is slightly restricted  about 2/3 in.   Instruments and sponge count is complete x2.  Estimated blood loss is  minimal.  The procedure is well tolerated by the patient, although  during the procedure she does experience bradycardia with a drop in her  blood pressure which was rapidly  corrected by anesthesiology.  The  patient is then taken in a very well and stable condition to recovery  room.      Crist Fat Rivard, M.D.  Electronically Signed     SAR/MEDQ  D:  07/07/2006  T:  07/07/2006  Job:  [id number]

## 2010-06-20 NOTE — Op Note (Signed)
[name], [name]                    ACCOUNT NO.:  [id number]   MEDICAL RECORD NO.:  [id number]                   PATIENT TYPE:  AMB   LOCATION:  DAY                                  FACILITY:  [facility]   PHYSICIAN:  Javier Docker, M.D.              DATE OF BIRTH:  1940-02-18   DATE OF PROCEDURE:  09/09/2001  DATE OF DISCHARGE:                                 OPERATIVE REPORT   PREOPERATIVE DIAGNOSES:  1. Rotator cuff tear.  2. History of capsulitis.  3. Impingement syndrome of the right shoulder.   POSTOPERATIVE DIAGNOSES:  1. Rotator cuff tear.  2. History of capsulitis.  3. Impingement syndrome of the right shoulder.   PROCEDURE PERFORMED:  1. Open rotator cuff repair.  2. Subacromial decompression.  3. Manipulation under anesthesia.   SURGEON:  Javier Docker, M.D.   BRIEF HISTORY AND INDICATIONS:  This is a 70 year old female, who has had  refractory adhesive capsulitis of the shoulder and MRI indicating a  significant partial tear of the rotator cuff.  Despite conservative  treatment, her symptoms persisted and, in fact, progressed.  Operative  intervention was indicated for decompression of the subacromial space,  excision and repair of the partial tear, and lysis of adhesions.  Risks and  benefits discussed including bleeding, infection, damage to neurovascular  structures, suboptimal range of motion, fracture, need for revision,  nonhealing of the tendon, persistent postoperative rehabilitation.   TECHNIQUE:  The patient supine in beach chair position.  After induction of  adequate general anesthesia and 1 g of Kefzol, the right shoulder and upper  extremity was prepped and draped in the usual sterile fashion.  Incision was  made over the anterior aspect of the acromion in Langer's lines.  Subcutaneous tissue was dissected.  Electrocautery was utilized to achieve  hemostasis.  The raphe between the anterior and lateral heads of the deltoid  was  identified, divided, subperiosteally elevated off the anterior third of  the  acromion with an AO elevator.  Retractor was then placed.  The CA  ligament was detached and excised.  There was an anterior osteophytic spur  noted.  This was removed with a Matt Holmes rongeur as well as an oscillating saw  converting it to a type 1 acromion.  Hypotrophic bursa was noted.  This was  excised.  There were significant subacromial adhesions noted and between the  bursa and the deltoid, and I digitally dislodged these.  That improved her  range of motion from that examined preoperatively, giving this to near full  range of motion.   Following excision of the bursa, I fully examined the rotator cuff tear, and  there was found a significant portion of approximately 1 cm in length and  0.5 cm in width attenuation near complete tear approximately 75% in the  rotator cuff at the supraspinatus near its insertion.  I excised this.  Good  bleeding tissue was then residual.  I decorticated beneath the excision and  repaired with #1 Vicryl interrupted figure-of-eight sutures with an  excellent repair side-to-side.  Good range of motion following this.  Gentle  manipulation to improve her abduction was performed.   Next, the wound copiously irrigated.  Good range of motion without residual  impingement.  Next, the raphe between the anterolateral heads was repaired  with #1 Vicryl interrupted figure-of-eight sutures to the acromion.  Subcutaneous tissue reapproximated with 2-0 Vicryl simple sutures, and skin  was reapproximated with 4-0 subcuticular Prolene.  The wound was reinforced  with Steri-Strips.  Abduction pillow was applied.  Sterile dressings placed.  She was extubated without difficulty and transported to the recovery room in  satisfactory condition.   The patient tolerated the procedure well with no known complications.                                               Javier Docker, M.D.     JCB/MEDQ  D:  09/09/2001  T:  09/12/2001  Job:  [id number]

## 2010-06-20 NOTE — Discharge Summary (Signed)
[name], [name]           ACCOUNT NO.:  [id number]   MEDICAL RECORD NO.:  [id number]          PATIENT TYPE:  OIB   LOCATION:  9303                          FACILITY:  [facility]   PHYSICIAN:  Crist Fat. Rivard, M.D. DATE OF BIRTH:  [birth year]   DATE OF ADMISSION:  07/07/2006  DATE OF DISCHARGE:  07/08/2006                               DISCHARGE SUMMARY   DISCHARGE DIAGNOSIS:  Symptomatic rectocele and enterocele.   OPERATION:  On the day of admission the patient underwent a posterior  repair with the repair of enterocele tolerating procedure well.   HISTORY OF PRESENT ILLNESS:  [name] is a 70 year old married  white female para 3-0-2-3 who is status post hysterectomy and bladder  surgery presenting for posterior repair because of a symptomatic  rectocele.  Please see the patient's dictated history and physical  examination for details.   PREOPERATIVE PHYSICAL EXAM:  VITAL SIGNS:  Blood pressure is 138/80,  weight is 206 pounds, height is 5 feet 74 inches tall.  GENERAL:  Exam is within normal limits.  PELVIC:  EGBUS was within normal limits, vagina reveals a 2-3/4  rectocele with a questionable enterocele. Uterus and cervix are  surgically absent.  Adnexa had no masses or tenderness.   HOSPITAL COURSE:  On the date of admission the patient underwent  aforementioned procedure tolerating it well.  The patient's  postoperative course was marked by the patient experiencing severe  itching which was attributed to her Percocet intake.  Percocet was  subsequently discontinued and the patient had administered Toradol and  Vicodin to which she responded well with diminished itching and good  pain control.  By postop day #1 the patient had resumed bowel and  bladder function and was therefore deemed ready for discharge home.   DISCHARGE LABS:  CBC was within normal limits with the patient's  hemoglobin being 12.3, hematocrit 36.1.  Basic metabolic panel was also  within normal  limits.   DISCHARGE MEDICATIONS:  The patient was advised to see her home  medication reconciliation sheet.  1. Colace 100 mg twice daily until bowel movements are regular.  2. Toradol 10 mg every 6 hours for pain as needed.  3. Vicodin 1-2 tablets every 4-6 hours as needed for pain.   FOLLOW UP:  The patient is scheduled for postop visit with Dr. Estanislado Pandy on  August 18, 2006 at 2:00 p.m.   DISCHARGE INSTRUCTIONS:  The patient was advised to call for any  excessive bleeding, pain or temperature greater than 100.4 degrees  Fahrenheit orally, or for any other concerns.  She was further advised  to avoid driving for 2 weeks, heavy lifting for 6 weeks, intercourse for  6 weeks, that she may walk up steps, that she may shower.  She was also  advised to do sitz baths three times daily for 1 week in just warm  water.   DIET:  Is a low-sodium ADA diet.   FINAL PATHOLOGY:  Not applicable.      [name].      Crist Fat Rivard, M.D.  Electronically Signed    EJP/MEDQ  D:  08/11/2006  T:  08/11/2006  Job:  [id number]

## 2010-06-20 NOTE — Discharge Summary (Signed)
NAME:  [name], [name]                     ACCOUNT NO.:  [id number]   MEDICAL RECORD NO.:  [id number]                   PATIENT TYPE:  INP   LOCATION:  3730                                 FACILITY:  [facility]   PHYSICIAN:  Carole Binning, M.D. [facility]         DATE OF BIRTH:  05/28/1940   DATE OF ADMISSION:  10/12/2002  DATE OF DISCHARGE:  10/13/2002                           DISCHARGE SUMMARY - REFERRING   SUMMARY OF HISTORY:  [name] is a 70 year old white female. She is the  mother of Arline Asp, one of the ER nurses. She presented to the emergency  department with acute onset of chest discomfort. She stated that she was  awakened on the morning of admission at approximately 4:30 and noticed that  her back between her shoulder blades was hurting. She fell back to sleep and  woke up around 9:30 and her back was still hurting. When she got out of bed,  her chest began to hurt. She describes this as a pressure sensation, gave it  a 5-7/10. She also stated that her neck was hurting. She did have some  shortness of breath but no nausea, vomiting, or diaphoresis. Since that  time, the discomfort has been constant when she arrived at the emergency  room around 4 p.m. She received sublingual nitroglycerin and aspirin which  relieved her discomfort. She also relates an episode this past Saturday  while sitting outside and had an acute onset of shortness of breath that  lasted 30 to 45 minutes and went away with lying down. She had an episode of  vomiting post a short episode of coughing on Tuesday night that immediately  resolved. She does describe an increased frequency of GERD. She does not  feel that this has been worse than usual.   PAST MEDICAL HISTORY:  1. Hypertension.  2. Hyperlipidemia.  3. GERD.  4. Right rotator cuff repair in 2003.  5. DJD.  6. Hysterectomy.  7. Multiple breast biopsies.  8. Status post cholecystectomy.   LABORATORY DATA:  Admission H and H was 13.4 and  48.6. Normal indices,  platelets 262, WBCs 7.0. Sodium 141, potassium 3.6, BUN 10, creatinine 0.8,  glucose 101. D-dimer was elevated at 1.11. ER CK-MBs x3 were negative for  myocardial infarction, and two CKs and troponins were negative for  myocardial infarction. EKG shows sinus bradycardia with a rate of 58, rare  PAC, nonspecific ST-T wave changes.   HOSPITAL COURSE:  Ms. Mcree was admitted to 3700. Overnight, she did not  have any further symptoms. Enzymes and EKGs were negative for myocardial  infarction. After Dr. Gerri Spore reviewed Dr. Melinda Crutch thoughts, he agreed  that the patient could be discharged home with an outpatient stress  Cardiolite. It was noted that a CT of the chest was performed and was  negative for pulmonary embolism and DVT; however, she did have a moderate  sized hiatal hernia.   DISCHARGE DIAGNOSES:  1. Prolonged chest discomfort of  undetermined etiology. Elevated D-dimer;     however, CT was negative for pulmonary embolism.  2. Gastroesophageal reflux disease.  3. Obesity.  4. History of previously.   DISPOSITION:  She is discharged home and asked to continue her home  medications using Lotrel 520 mg q.d., Zoloft 50 mg q.d. She was asked to  increase the Prevacid to 30 mg b.i.d., Lipitor 20 mg q.h.s., Demadex 20  q.d., and she was asked to begin a baby aspirin 81 mg q.d. Her activities  were not restricted. She was asked to maintain a low salt, fat, and  cholesterol diet and to avoid foods that aggravate her GERD. She will have a  stress Cardiolite on Monday, September 13, at 1 p.m. She was advised nothing  to eat or drink after midnight except for her medications with a sip of  water. She will see Dr. Melinda Crutch P.A. on Friday, September 13, at 3 p.m. for  followup. She was advised if her symptoms recur to return to the emergency  room for further evaluation. At the time of discharge, to blood that was  already drawn this morning, we will add fasting  lipids. Also on her followup  appointment with Dr. Melinda Crutch P.A., review of her stress Cardiolite will  need to be performed, encouragement of cardiac risk factor modification,  i.e., weight loss, and review of her lipid profile that was drawn just prior  to discharge.      Joellyn Rued, P.A. [facility]                    Carole Binning, M.D. [facility]    EW/MEDQ  D:  10/13/2002  T:  10/14/2002  Job:  [id number]   cc:   [name], M.D. [facility]

## 2010-08-20 ENCOUNTER — Other Ambulatory Visit (HOSPITAL_COMMUNITY): Payer: Self-pay | Admitting: Orthopedic Surgery

## 2010-08-20 ENCOUNTER — Other Ambulatory Visit: Payer: Self-pay | Admitting: Orthopedic Surgery

## 2010-08-20 ENCOUNTER — Ambulatory Visit (HOSPITAL_COMMUNITY)
Admission: RE | Admit: 2010-08-20 | Discharge: 2010-08-20 | Disposition: A | Discharge status: Home / Self Care | Payer: Medicare Other | Source: Ambulatory Visit | Attending: Orthopedic Surgery | Admitting: Orthopedic Surgery

## 2010-08-20 ENCOUNTER — Encounter (HOSPITAL_COMMUNITY): Payer: Medicare Other

## 2010-08-20 DIAGNOSIS — Z01818 Encounter for other preprocedural examination: Secondary | ICD-10-CM

## 2010-08-20 DIAGNOSIS — Z79899 Other long term (current) drug therapy: Secondary | ICD-10-CM | POA: Insufficient documentation

## 2010-08-20 DIAGNOSIS — I10 Essential (primary) hypertension: Secondary | ICD-10-CM | POA: Insufficient documentation

## 2010-08-20 DIAGNOSIS — Z01812 Encounter for preprocedural laboratory examination: Secondary | ICD-10-CM | POA: Insufficient documentation

## 2010-08-20 DIAGNOSIS — M76899 Other specified enthesopathies of unspecified lower limb, excluding foot: Secondary | ICD-10-CM | POA: Insufficient documentation

## 2010-08-20 DIAGNOSIS — Z01810 Encounter for preprocedural cardiovascular examination: Secondary | ICD-10-CM | POA: Insufficient documentation

## 2010-08-20 LAB — URINALYSIS, ROUTINE W REFLEX MICROSCOPIC
Bilirubin Urine: NEGATIVE
Glucose, UA: NEGATIVE mg/dL
Ketones, ur: NEGATIVE mg/dL
pH: 6 (ref 5.0–8.0)

## 2010-08-20 LAB — COMPREHENSIVE METABOLIC PANEL
ALT: 34 U/L (ref 0–35)
Albumin: 3.8 g/dL (ref 3.5–5.2)
Alkaline Phosphatase: 111 U/L (ref 39–117)
Potassium: 4.8 mEq/L (ref 3.5–5.1)
Sodium: 141 mEq/L (ref 135–145)
Total Protein: 7.4 g/dL (ref 6.0–8.3)

## 2010-08-20 LAB — CBC
Hemoglobin: 14.9 g/dL (ref 12.0–15.0)
MCH: 28.2 pg (ref 26.0–34.0)
MCV: 86.8 fL (ref 78.0–100.0)
RBC: 5.29 MIL/uL — ABNORMAL HIGH (ref 3.87–5.11)

## 2010-08-20 LAB — URINE MICROSCOPIC-ADD ON

## 2010-08-20 LAB — SURGICAL PCR SCREEN
MRSA, PCR: NEGATIVE
Staphylococcus aureus: NEGATIVE

## 2010-08-20 LAB — PROTIME-INR: Prothrombin Time: 13.3 seconds (ref 11.6–15.2)

## 2010-08-25 ENCOUNTER — Telehealth: Payer: Self-pay | Admitting: Pulmonary Disease

## 2010-08-25 NOTE — Telephone Encounter (Signed)
PT IS AWARE

## 2010-08-25 NOTE — Telephone Encounter (Signed)
Fax received from the pharmacy re: Acyclovir 400 mg tabs on back orider. They would like to substitute with Acyclovir 200 mg and adjust quantity given to the patient. Okayed for substitution with 200 mg. LMOMTCB so pt can be informed of the change.

## 2010-08-27 ENCOUNTER — Ambulatory Visit (HOSPITAL_COMMUNITY)
Admission: RE | Admit: 2010-08-27 | Discharge: 2010-08-28 | Disposition: A | Discharge status: Home / Self Care | Payer: Medicare Other | Source: Ambulatory Visit | Attending: Orthopedic Surgery | Admitting: Orthopedic Surgery

## 2010-08-27 DIAGNOSIS — I10 Essential (primary) hypertension: Secondary | ICD-10-CM | POA: Insufficient documentation

## 2010-08-27 DIAGNOSIS — Z01812 Encounter for preprocedural laboratory examination: Secondary | ICD-10-CM | POA: Insufficient documentation

## 2010-08-27 DIAGNOSIS — M76899 Other specified enthesopathies of unspecified lower limb, excluding foot: Secondary | ICD-10-CM | POA: Insufficient documentation

## 2010-08-27 DIAGNOSIS — M25559 Pain in unspecified hip: Secondary | ICD-10-CM | POA: Insufficient documentation

## 2010-08-27 DIAGNOSIS — X58XXXA Exposure to other specified factors, initial encounter: Secondary | ICD-10-CM | POA: Insufficient documentation

## 2010-08-27 DIAGNOSIS — IMO0002 Reserved for concepts with insufficient information to code with codable children: Secondary | ICD-10-CM | POA: Insufficient documentation

## 2010-08-27 DIAGNOSIS — K219 Gastro-esophageal reflux disease without esophagitis: Secondary | ICD-10-CM | POA: Insufficient documentation

## 2010-08-27 DIAGNOSIS — E119 Type 2 diabetes mellitus without complications: Secondary | ICD-10-CM | POA: Insufficient documentation

## 2010-08-27 HISTORY — PX: HIP SURGERY: SHX245

## 2010-08-27 LAB — GLUCOSE, CAPILLARY
Glucose-Capillary: 196 mg/dL — ABNORMAL HIGH (ref 70–99)
Glucose-Capillary: 186 mg/dL — ABNORMAL HIGH (ref 70–99)

## 2010-08-28 NOTE — Op Note (Signed)
[name], [name] NO.:  [id number]  MEDICAL RECORD NO.:  [id number]  LOCATION:  DAYL                         FACILITY:  [facility] ([facility])  PHYSICIAN:  Ollen Gross, M.D.    DATE OF BIRTH:  01/27/1941  DATE OF PROCEDURE:  08/27/2010 DATE OF DISCHARGE:                              OPERATIVE REPORT   PREOPERATIVE DIAGNOSIS:  Right hip intractable bursitis.  POSTOPERATIVE DIAGNOSIS:  Right hip intractable bursitis.  PROCEDURE:  Right hip bursectomy, iliotibial band recession.  SURGEON:  Ollen Gross, M.D.  ASSISTANT:  Alexzandrew L. Perkins, P.A.C.  ANESTHESIA:  General.  ESTIMATED BLOOD LOSS:  Minimal.  DRAINS:  None.  COMPLICATIONS:  None.  CONDITION:  Stable to recovery.  CLINICAL NOTE:  [name] is a 70 year old female with chronic lateral hip pain, right worse than left.  She has had nonoperative management including injections and other modalities.  Unfortunately this has persistently gotten worse over time.  She presents now for bursectomy and possible tendon repair if there is tear of the gluteal tendon.  PROCEDURE IN DETAIL:  After successful administration of general anesthetic, the patient was placed in left lateral decubitus position with the right side up and held with the hip positioner.  Right lower extremity was isolated from perineum with plastic drapes and prepped and draped in usual sterile fashion.  About 4-inch incision was made laterally over the lateral aspect of the hip centered at the tip of the greater trochanter.  Skin cut with 10 blade through subcutaneous tissue to the level of fascia lata.  I incised the fascia lata in line with the incision.  There was some fluid in the bursa and the bursa was fairly thickened but no calcifications were noted.  I removed the bursa.  We then internally and external rotated the hip and did not see any further bursal tissue.  I inspected the gluteus medius tendon and there was no evidence  of any tear.  The muscle looked normal and the tendon attaches normally.  I dissected between the fibers of the medius to get down to the gluteus minimus.  I palpated the gluteus minimus and felt the fibers insert on to the tip of the greater trochanter with no gaps noted.  Did not palpate any tears.  I then repaired that split in gluteus medius with interrupted #1 Vicryl.  Wound was again inspected and meticulous hemostasis achieved.  I did not see any other abnormal tissue.  I then thoroughly irrigated with saline solution.  I removed a triangular portion of the fascia lata with the apex being proximal.  This was to prevent the tissue from rubbing over the tip of the greater trochanter. We then repaired the fascia lata leaving just a tiny triangular defect. Wound was again irrigated.  A 30 cc of 0.25% Marcaine with epi was injected through the subcu tissues.  Subcu was closed with interrupted #1 Vicryl and interrupted 2-0 Vicryl and subcuticular closed with running 4-0 Monocryl.  Incisions were cleaned and dried and Steri-Strips and a bulky sterile dressing applied.  She was awakened and transferred to recovery in stable condition.     Ollen Gross, M.D.     FA/MEDQ  D:  08/27/2010  T:  08/27/2010  Job:  [id number]  Electronically Signed by Ollen Gross M.D. on 08/28/2010 03:30:56 PM

## 2010-09-03 ENCOUNTER — Other Ambulatory Visit: Payer: Self-pay | Admitting: Pulmonary Disease

## 2010-09-03 MED ORDER — ACYCLOVIR 200 MG PO CAPS: ORAL_CAPSULE | ORAL | Status: DC

## 2010-09-03 NOTE — Telephone Encounter (Signed)
Received refill request from [facility] on [address] for pt's acyclovir 400mg  #60, 1 tab by mouth tid for 5days prn at onset of symptoms > 400mg  strength is on backorder, can 200 - or - 800mg  be used instead?  Per TD, okay to give 200mg  2 tabs tid #120 with 2 refills.  This was faxed back to [facility] by Philipp Deputy on [phone].

## 2010-11-07 ENCOUNTER — Telehealth: Payer: Self-pay | Admitting: Adult Health

## 2010-11-07 MED ORDER — AZITHROMYCIN 250 MG PO TABS: ORAL_TABLET | ORAL | Status: AC

## 2010-11-07 MED ORDER — AZITHROMYCIN 250 MG PO TABS: ORAL_TABLET | ORAL | Status: DC

## 2010-11-07 NOTE — Telephone Encounter (Signed)
I informed pt of TP's findings and recommendations. Pt verbalized understanding. Also scheduled appointment with SN 12/15/2010. Rx sent to pharmacy.

## 2010-11-07 NOTE — Telephone Encounter (Signed)
Addended by: Julaine Hua on: 11/07/2010 12:30 PM   Modules accepted: Orders

## 2010-11-07 NOTE — Telephone Encounter (Signed)
ATC pt NA unable to leave VM [facility] 

## 2010-11-07 NOTE — Telephone Encounter (Signed)
I spoke with pt and she c/o sore throat, cough w/ clear phlem, chest tightness x 5 days. Pt denies any wheezing, fever, nausea, vomiting, chills. Pt is requesting an abx be called in for her. Will forward to TP for recs since SN is not in the office. Please advise Tammy, Thanks  Allergies  Allergen Reactions  . Ceftriaxone Sodium     REACTION: hives  . Cephalexin     REACTION: hives  . Clindamycin     REACTION: hives  . Oxycodone-Acetaminophen     REACTION: hives  . Sulfonamide Derivatives     REACTION: hives    Carver Fila, CMA

## 2010-11-07 NOTE — Telephone Encounter (Signed)
mucinex dm Twice daily   zpack #1 take as directed , no refills  Ov if not improving  Needs ov set up with Dr. Kriste Basque  For routine follow up ov  Please contact office for sooner follow up if symptoms do not improve or worsen or seek emergency care

## 2010-11-20 LAB — CBC
HCT: 36.1
Hemoglobin: 12.3
Hemoglobin: 14.2
MCHC: 33.3
MCHC: 34
MCV: 89.7
RBC: 4.02
RBC: 4.83
WBC: 7.6

## 2010-11-20 LAB — BASIC METABOLIC PANEL
CO2: 32
Calcium: 9.7
GFR calc Af Amer: >60
GFR calc non Af Amer: >60
Sodium: 141

## 2010-12-05 ENCOUNTER — Telehealth: Payer: Self-pay | Admitting: Pulmonary Disease

## 2010-12-05 MED ORDER — AZITHROMYCIN 250 MG PO TABS: ORAL_TABLET | ORAL | Status: AC

## 2010-12-05 NOTE — Telephone Encounter (Signed)
Per TP---ok for zpak  #1  Take as directed, use mucinex as needed bid with plenty of fluids and tylenol prn  And follow up if not improving. Called and spoke with pt and she is aware of recs per TP and that this med has been sent to the pharmacy she requested and pt stated that she already has appt with SN scheduled.

## 2010-12-05 NOTE — Telephone Encounter (Signed)
Called, spoke with pt.  She c/o chest tightness, cough - prod with small amount of green mucus, body soreness, HA, some wheezing, and temp up to 100.  Denies any increased SOB.  Taking mucinex.  Symptoms started on Tuesday but worsened yesterday.  Requesting rx to be called into [facility].  Allergies verified.  Dr. Kriste Basque, pls advise.  Thanks!   Allergies  Allergen Reactions  . Ceftriaxone Sodium     REACTION: hives  . Cephalexin     REACTION: hives  . Clindamycin     REACTION: hives  . Oxycodone-Acetaminophen     REACTION: hives  . Sulfonamide Derivatives     REACTION: hives

## 2010-12-15 ENCOUNTER — Ambulatory Visit: Payer: Medicare Other | Admitting: Pulmonary Disease

## 2011-01-14 ENCOUNTER — Ambulatory Visit (INDEPENDENT_AMBULATORY_CARE_PROVIDER_SITE_OTHER): Payer: Medicare Other | Admitting: Pulmonary Disease

## 2011-01-14 ENCOUNTER — Encounter: Payer: Self-pay | Admitting: Pulmonary Disease

## 2011-01-14 DIAGNOSIS — K219 Gastro-esophageal reflux disease without esophagitis: Secondary | ICD-10-CM

## 2011-01-14 DIAGNOSIS — E7800 Pure hypercholesterolemia, unspecified: Secondary | ICD-10-CM

## 2011-01-14 DIAGNOSIS — I10 Essential (primary) hypertension: Secondary | ICD-10-CM

## 2011-01-14 DIAGNOSIS — M1990 Unspecified osteoarthritis, unspecified site: Secondary | ICD-10-CM

## 2011-01-14 DIAGNOSIS — J209 Acute bronchitis, unspecified: Secondary | ICD-10-CM

## 2011-01-14 DIAGNOSIS — E669 Obesity, unspecified: Secondary | ICD-10-CM

## 2011-01-14 DIAGNOSIS — F411 Generalized anxiety disorder: Secondary | ICD-10-CM

## 2011-01-14 DIAGNOSIS — E119 Type 2 diabetes mellitus without complications: Secondary | ICD-10-CM

## 2011-01-14 DIAGNOSIS — F329 Major depressive disorder, single episode, unspecified: Secondary | ICD-10-CM

## 2011-01-14 MED ORDER — METFORMIN HCL 500 MG PO TABS: 500.0000 mg | ORAL_TABLET | Freq: Two times a day (BID) | ORAL | Status: DC

## 2011-01-14 MED ORDER — OMEPRAZOLE 20 MG PO CPDR: 20.0000 mg | DELAYED_RELEASE_CAPSULE | Freq: Every day | ORAL | Status: DC

## 2011-01-14 MED ORDER — AMLODIPINE BESYLATE 10 MG PO TABS: 10.0000 mg | ORAL_TABLET | Freq: Every day | ORAL | Status: DC

## 2011-01-14 MED ORDER — ALPRAZOLAM 0.5 MG PO TABS: ORAL_TABLET | ORAL | Status: DC

## 2011-01-14 MED ORDER — SERTRALINE HCL 50 MG PO TABS: 50.0000 mg | ORAL_TABLET | Freq: Every day | ORAL | Status: DC

## 2011-01-14 MED ORDER — BENAZEPRIL HCL 40 MG PO TABS: 40.0000 mg | ORAL_TABLET | Freq: Every day | ORAL | Status: DC

## 2011-01-14 NOTE — Patient Instructions (Signed)
Today we updated your med list in our EPIC system...    Continue your current medications the same...    We refilled the meds you requested...  We will arrange for follow up appts w/ drEllison and the LIPID CLINIC...  Please return to our lab one morning for your follow up fasting blood work...    Please call the PHONE TREE in a few days for your results...    Dial [id number] & when prompted enter your patient number followed by the # symbol...    Your patient number is:  [id number]#  Let's get on track w/ our diet & exercise program...    The goal is to lose 15-20 lbs!!!  Call for any questions...  Let's plan a follow up visit in 6 months.Marland KitchenMarland Kitchen

## 2011-01-16 ENCOUNTER — Telehealth: Payer: Self-pay | Admitting: Pulmonary Disease

## 2011-01-16 MED ORDER — PREDNISONE 10 MG PO TABS: ORAL_TABLET | ORAL | Status: DC

## 2011-01-16 MED ORDER — LEVOFLOXACIN 500 MG PO TABS: 500.0000 mg | ORAL_TABLET | Freq: Every day | ORAL | Status: AC

## 2011-01-16 NOTE — Telephone Encounter (Signed)
Called and spoke with pt and she stated that she has cough with yellow sputum, coughed all last night.  Pt stated that last time she had this SN gave her levaquin that really worked for her.  SN and TP out of the office today---please advise. Thanks  Allergies  Allergen Reactions  . Ceftriaxone Sodium     REACTION: hives  . Cephalexin     REACTION: hives  . Clindamycin     REACTION: hives  . Oxycodone-Acetaminophen     REACTION: hives  . Sulfonamide Derivatives     REACTION: hives

## 2011-01-16 NOTE — Telephone Encounter (Signed)
levaquin 500mg  daily x 5 days Does she usually take steroids ?

## 2011-01-16 NOTE — Telephone Encounter (Signed)
Called and spoke with pt and she is aware that per MR---we have sent in rx for the prednisone to take as directed. Pt is aware.

## 2011-01-16 NOTE — Telephone Encounter (Signed)
The cough is from asthma attack. If symptoms are moderate to severe I would recommend a short course of steroids. She she can start it today or over the next one or 2 days depending on how she feels. If she is interested you can send her the following   Take prednisone 40 mg daily x 2 days, then 20mg  daily x 2 days, then 10mg  daily x 2 days, then 5mg  daily x 2 days and stop

## 2011-01-16 NOTE — Telephone Encounter (Signed)
rx for the levaquin has been sent to the pharmacy.  Called and spoke with the pt and pt is aware.  Pt stated that she has taken prednisone before but not from SN.  Pt is requesting a cough med to take.  Please advise. thanks

## 2011-01-19 ENCOUNTER — Ambulatory Visit: Payer: Medicare Other | Admitting: Endocrinology

## 2011-01-19 ENCOUNTER — Telehealth: Payer: Self-pay | Admitting: *Deleted

## 2011-01-19 NOTE — Telephone Encounter (Signed)
Per SN---needs to give it a little more time.  Rest, increase fluids , tylenol and cont her neb meds.  Called and spoke with pt and she is aware of new appt with Dr. Everardo All on jan 7 at 10:30 and pt is aware that if she needs to cancel this appt she will need to call the Friday before the appt.  Pt voiced her understanding and we are still working on the appt change for the lipid clinic.

## 2011-01-19 NOTE — Telephone Encounter (Signed)
Called to make pt aware of her appts scheduled for her for Dr. Everardo All and the lipid clinic----pt was called in levaquin and pred dosepak on Friday and pt is not any better.  Still cough with yellow sputum, fever last night, chills. Pt is still using her neb meds but is not any better.  Has 1 levaquin left .  SN please advise.    Allergies  Allergen Reactions  . Ceftriaxone Sodium     REACTION: hives  . Cephalexin     REACTION: hives  . Clindamycin     REACTION: hives  . Oxycodone-Acetaminophen     REACTION: hives  . Sulfonamide Derivatives     REACTION: hives

## 2011-01-20 ENCOUNTER — Emergency Department (HOSPITAL_COMMUNITY): Payer: Medicare Other

## 2011-01-20 ENCOUNTER — Encounter (HOSPITAL_COMMUNITY): Payer: Self-pay

## 2011-01-20 ENCOUNTER — Emergency Department (HOSPITAL_COMMUNITY)
Admission: EM | Admit: 2011-01-20 | Discharge: 2011-01-20 | Disposition: A | Discharge status: Home / Self Care | Payer: Medicare Other | Attending: Emergency Medicine | Admitting: Emergency Medicine

## 2011-01-20 DIAGNOSIS — J3489 Other specified disorders of nose and nasal sinuses: Secondary | ICD-10-CM | POA: Insufficient documentation

## 2011-01-20 DIAGNOSIS — R059 Cough, unspecified: Secondary | ICD-10-CM | POA: Insufficient documentation

## 2011-01-20 DIAGNOSIS — K219 Gastro-esophageal reflux disease without esophagitis: Secondary | ICD-10-CM | POA: Insufficient documentation

## 2011-01-20 DIAGNOSIS — J40 Bronchitis, not specified as acute or chronic: Secondary | ICD-10-CM

## 2011-01-20 DIAGNOSIS — E119 Type 2 diabetes mellitus without complications: Secondary | ICD-10-CM | POA: Insufficient documentation

## 2011-01-20 DIAGNOSIS — R05 Cough: Secondary | ICD-10-CM | POA: Insufficient documentation

## 2011-01-20 DIAGNOSIS — Z79899 Other long term (current) drug therapy: Secondary | ICD-10-CM | POA: Insufficient documentation

## 2011-01-20 DIAGNOSIS — R509 Fever, unspecified: Secondary | ICD-10-CM | POA: Insufficient documentation

## 2011-01-20 DIAGNOSIS — F341 Dysthymic disorder: Secondary | ICD-10-CM | POA: Insufficient documentation

## 2011-01-20 DIAGNOSIS — E7800 Pure hypercholesterolemia, unspecified: Secondary | ICD-10-CM | POA: Insufficient documentation

## 2011-01-20 DIAGNOSIS — I10 Essential (primary) hypertension: Secondary | ICD-10-CM | POA: Insufficient documentation

## 2011-01-20 MED ORDER — AZITHROMYCIN 250 MG PO TABS: 250.0000 mg | ORAL_TABLET | Freq: Every day | ORAL | Status: AC

## 2011-01-20 MED ORDER — IPRATROPIUM BROMIDE 0.02 % IN SOLN
0.5000 mg | Freq: Once | RESPIRATORY_TRACT | Status: AC
  Administered 2011-01-20: 0.5 mg via RESPIRATORY_TRACT
  Filled 2011-01-20: qty 2.5

## 2011-01-20 MED ORDER — ALBUTEROL SULFATE (5 MG/ML) 0.5% IN NEBU
5.0000 mg | INHALATION_SOLUTION | Freq: Once | RESPIRATORY_TRACT | Status: AC
  Administered 2011-01-20: 5 mg via RESPIRATORY_TRACT
  Filled 2011-01-20: qty 1

## 2011-01-20 MED ORDER — HYDROCOD POLST-CHLORPHEN POLST 10-8 MG/5ML PO LQCR: 5.0000 mL | Freq: Two times a day (BID) | ORAL | Status: DC

## 2011-01-20 NOTE — Telephone Encounter (Signed)
Pt asked to speak w/ Leigh regarding the appt w/ the Lipid clinic.  [phone].  [name]

## 2011-01-20 NOTE — ED Notes (Signed)
Pt c/o sob and productive cough w/yellow colored sputum, non cardiac chest and upper back pain d/t coughing x1 wk, denies N/V, sts prescribed Levaquin, albuterol inhaler, and Prednisone Friday Nov. 14th w/no relief

## 2011-01-20 NOTE — Telephone Encounter (Signed)
Called and spoke with pt about her appt with the lipid clinic--jan 10 at 10:00--spoke with pt and she is aware of this.  Pt stated that she has been wheezing all morning and doing her breathing tx as well.  Advised pt that i can schedule her with TP today at 2:15 in the [facility] office but pt did sound pretty bad over the phone so i advised her that she may be better going to be seen in the ER now. Pt voiced her understanding and will go to the [facility] er.

## 2011-01-20 NOTE — ED Notes (Signed)
Pt. Has flu-like symptoms for 1 week.  Very  Thick yellow productive cough, chills , fever, weakness

## 2011-01-20 NOTE — ED Provider Notes (Signed)
History     CSN: [id number] Arrival date & time: 01/20/2011 12:39 PM   First MD Initiated Contact with Patient 01/20/11 1423      Chief Complaint  Patient presents with  . Influenza    (Consider location/radiation/quality/duration/timing/severity/associated sxs/prior treatment) HPI.... cough and wheezing for one week with productive sputum. Low-grade fever and chills. Nothing makes it better or worse. Presently on prednisone. Severity is moderate. Able to take oral fluid  Past Medical History  Diagnosis Date  . DIABETES MELLITUS 08/10/2007  . 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 HYPERCHOLESTEROLEMIA 12/17/2006  . OBESITY 06/04/2008  . ANXIETY 06/04/2008  . DEPRESSION 08/10/2007  . HYPERTENSION 12/17/2006  . ASTHMATIC BRONCHITIS, ACUTE 06/04/2008  . ALLERGIC RHINITIS 12/13/2007  . GERD 12/17/2006  . DEGENERATIVE JOINT DISEASE 12/17/2006    Past Surgical History  Procedure Date  . Laminectomy [id number]    s/p-Dr. Jule Ser  . Abdominal hysterectomy 1974    s/p  . Bladder tack 1974  . Cholecystectomy   . Tonsillectomy   . Rotator cuff repair 8/03    right, Dr. Shelle Iron  . Enterocele repair 06/08    Dr. Estanislado Pandy  . Rotator cuff repair 10/26/2008    left, Dr. Rayburn Ma  . Hip surgery 08/27/2010    DR. Despina Hick    Family History  Problem Relation Age of Onset  . COPD Mother   . Arthritis Mother   . Emphysema Mother   . Mental illness Paternal Grandfather   . Heart disease Other     Sibling  . Diabetes Other     Sibling   . Diabetes Brother   . Cancer Other     Prostate Cancer    History  Substance Use Topics  . Smoking status: Never Smoker   . Smokeless tobacco: Not on file  . Alcohol Use: Not on file    OB History    Grav Para Term Preterm Abortions TAB SAB Ect Mult Living            3      Review of Systems  All other systems reviewed and are negative.    Allergies  Ceftriaxone sodium; Cephalexin; Clindamycin; Oxycodone-acetaminophen; and Sulfonamide derivatives  Home Medications    Current Outpatient Rx  Name Route Sig Dispense Refill  . ALBUTEROL SULFATE (2.5 MG/3ML) 0.083% IN NEBU  2.5 mg every 4 (four) hours as needed. As needed for shortness of breath.    . AMLODIPINE BESYLATE 10 MG PO TABS Oral Take 1 tablet (10 mg total) by mouth daily. 30 tablet 11  . BENAZEPRIL HCL 40 MG PO TABS Oral Take 1 tablet (40 mg total) by mouth daily. 30 tablet 11  . VITAMIN D 2000 UNITS PO TABS Oral Take 2,000 Units by mouth daily.      Marland Kitchen HYDROCHLOROTHIAZIDE 25 MG PO TABS  1/2 to 1 tablet by mouth once a day as needed for swelling     . LEVOFLOXACIN 500 MG PO TABS Oral Take 1 tablet (500 mg total) by mouth daily. 5 tablet 0  . METFORMIN HCL 500 MG PO TABS Oral Take 1 tablet (500 mg total) by mouth 2 (two) times daily with a meal. 60 tablet 5  . NAPROXEN 250 MG PO TABS Oral Take 250 mg by mouth daily as needed. As needed for bursitis pain in hip     . OMEPRAZOLE 20 MG PO CPDR Oral Take 1 capsule (20 mg total) by mouth daily. 30 capsule 11  . POTASSIUM CHLORIDE CRYS CR 20 MEQ  PO TBCR Oral Take 20 mEq by mouth daily as needed. (take with the HCTZ)     . PREDNISONE 10 MG PO TABS Oral Take 5 mg by mouth daily. Take 40mg  x 2 days, 20mg  x 2 days, 10mg  x 2 days, 5 mg x 2 days then stop     . SERTRALINE HCL 50 MG PO TABS Oral Take 1 tablet (50 mg total) by mouth daily. 30 tablet 11  . SITAGLIPTIN PHOSPHATE 50 MG PO TABS Oral Take 50 mg by mouth daily.      Marland Kitchen ALPRAZOLAM 0.5 MG PO TABS  1/2-1 tablet three times a day as needed for nerves 90 tablet 5  . AZITHROMYCIN 250 MG PO TABS Oral Take 1 tablet (250 mg total) by mouth daily. Take first 2 tablets together, then 1 every day until finished. 6 tablet 0  . HYDROCOD POLST-CHLORPHEN POLST 10-8 MG/5ML PO LQCR Oral Take 5 mLs by mouth every 12 (twelve) hours. 120 mL 0    BP 125/54  Pulse 105  Temp(Src) 98.1 F (36.7 C) (Oral)  Resp 24  Ht 5\' 8"  (1.727 m)  Wt 197 lb (89.359 kg)  BMI 29.95 kg/m2  SpO2 99%  Physical Exam  Nursing note and  vitals reviewed. Constitutional: She is oriented to person, place, and time. She appears well-developed and well-nourished.  HENT:  Head: Normocephalic and atraumatic.  Eyes: Conjunctivae and EOM are normal. Pupils are equal, round, and reactive to light.  Neck: Normal range of motion. Neck supple.  Cardiovascular: Normal rate and regular rhythm.   Pulmonary/Chest: Effort normal.       Bilateral expiratory wheezing  Abdominal: Soft. Bowel sounds are normal.  Musculoskeletal: Normal range of motion.  Neurological: She is alert and oriented to person, place, and time.  Skin: Skin is warm and dry.  Psychiatric: She has a normal mood and affect.    ED Course  Procedures (including critical care time)  Labs Reviewed - No data to display Dg Chest 2 View  01/20/2011  *RADIOLOGY REPORT*  Clinical Data: Cough, congestion, chest pain, shortness of breath, fever, chills, weakness, hypertension, diabetes  CHEST - 2 VIEW  Comparison: 08/20/2010  Findings: Normal heart size, mediastinal contours, and pulmonary vascularity. Lungs clear. No pleural effusion or pneumothorax. Bones appear demineralized.  IMPRESSION: No acute abnormalities.  Original Report Authenticated By: Lollie Marrow, M.D.     1. Bronchitis       MDM  Chest x-ray negative. Feeling better after breathing treatment. Could be mycoplasma. Will Rx Zithromax and Tussionex        Donnetta Hutching, MD 01/20/11 1620

## 2011-01-22 ENCOUNTER — Ambulatory Visit: Payer: Medicare Other

## 2011-01-28 ENCOUNTER — Telehealth: Payer: Self-pay | Admitting: Pulmonary Disease

## 2011-01-28 NOTE — Telephone Encounter (Signed)
Spoke with patient states has had bronchitis for 2 weeks,has had 2 rounds of abx,along with prednisone.States  She has a productive cough(yellowish and thick) increase SOB AN Wheezing, Not getting any rest.requesting an rx  For promethazine and codeine syrup.denies any fever. Pharmacy [facility] [address]. Dr Kriste Basque not in office  Dr Sherene Sires Please advise. Allergies  Allergen Reactions  . Ceftriaxone Sodium     REACTION: hives  . Cephalexin     REACTION: hives  . Clindamycin     REACTION: hives  . Oxycodone-Acetaminophen     REACTION: hives  . Sulfonamide Derivatives     REACTION: hives

## 2011-01-28 NOTE — Telephone Encounter (Signed)
Pt is scheduled to see CDY on Friday at 1:30 for an evaluation. Pt aware to seek emergency care if she worsens in the meantime. She voiced her understanding and needed nothing further

## 2011-01-28 NOTE — Telephone Encounter (Signed)
i'd be happy to see her with all meds in hand to regroup but have nothing else to offer over the phone

## 2011-01-30 ENCOUNTER — Ambulatory Visit: Payer: Medicare Other | Admitting: Internal Medicine

## 2011-02-01 ENCOUNTER — Encounter: Payer: Self-pay | Admitting: Pulmonary Disease

## 2011-02-01 NOTE — Progress Notes (Signed)
Subjective:     Patient ID: [name], female   DOB: Mar 28, 1940, 70 y.o.   MRN: [id number]  HPI 70 y/o WF here for a follow up visit... she has multiple medical problems including HBP, Chol, DM, Obesity, etc...  ~  March 05, 2010:  13mo ROV >     [name] saw [name] for her DM 6/11> she stopped her Glimep on her own due to ?hypoglycemia ("it brings me down too quick") & she wanted to stay on the Metformin & Januvia alone; her BS has been >200 at home and A1c ~8 on checks last yr;  she is not on diet ("I crave sweets") & not exercising;  she is asking about Victoza to help her lose wt & we discussed a comprehensive program of diet (w/ nutritional counselling), exercise, & meds= Metformin500Bid + Victoza 0.6 ==> 1.2mg  daily.    She notes continue Ortho prob w/ bursitis etc; had shot in right hip recently & may need surg per [name] she says.    She notes that she switched Gastroenterologists to "the oriental lady" meaning ?DrNatt-Mann & apparently had EGD & Colonoscopy ?several yrs ago, we don't have these reports, nothing avail in [place], she states "several polyps removed".  ~  January 14, 2011:  33mo ROV & she is c/o recent bronchitis w/ cough, green sputum production, & aching/ sore/ HA/ and sl wheezy & SOB; she had ZPak called in x2 & has been taking Mucinex/ Fluids/ etc; finally went to an [facility] & was given Levaquin & that's when she started to improve she says; she also had the flu vaccine 9/12; she notes that she is finally better & approaching her baseline...  HBP> on Amlodip10, Benazepril40, HCT25 (takes 1/2-1 prn w/ K20/d); BP= 130/72 & she notes that she only take the diuretic in the summer "when I swell"; denies CP, palpit, dizzy, SOB, edema...  CHOL> prev on Cres40 but she stopped on her own "couldn't afford it" & she didn't call for alternative Rx; we decided to check FLP on diet alone & go from there (she will ret for FLP); discussed Lipid Clinic for her...  DM>  Followed by [name] but last seen 2/12 on Metform & Victoza; he tried to incr her Metformin to [facility] but she has since decreased the Metform500Bid & stopped the Victoza; she tells me she is taking Januvia50/d "I have samples" she says; she says home accuchecks are "around 150" but no recent labs avail & she will ret for fasting blood work...  OBESITY> weight is 198# which is down 5# over the last year; she is congratulated & encouraged to keep up the great work; we reviewed low carb low fat diet & exercise prescription...  GERD/ Polyps> Followed by [name] but we don't have any notes from her; on Prilosec20 & she denies GI symptoms at this time...  Hx Urinary Incont> Followed by [name] GYN & [name] Urology...  DJD/ LBP> she had right hip bursectomy 7/12 by [name] for intractable right hip bursitis; she takes Naprosyn as needed...  Anxiety/ Depression> on Zoloft 50mg /d and Xanax 0,5mg  but states that she rarely takes it...          Problem List:   ALLERGIC RHINITIS (ICD-477.9) - uses OTC meds Prn...  Hx of ASTHMATIC BRONCHITIS, ACUTE (ICD-466.0) - uses NEB w/ Albuterol Prn & she reports breathing better since the last acute exac... 12/12: she reports bronchitic exac that didn't respond to 2 ZPaks but got better after Levaquin given  via ER...  HYPERTENSION (ICD-401.9) - currently on AMLODIPINE 10mg /d, BENAZEPRIL 40mg /d (and HCTZ 25mg  + KCl which she takes just as needed for swelling)...  ~  2/12:  BP= 132/66 today and not checking BP at home... denies HA, visual changes, CP, palipit, dizziness, syncope, dyspnea, edema, etc.  ~  Labs 2/12 showed norm Electrolytes, Renal, blood count etc... ~  12/12:  BP= 130/72 & she remains asymptomtic w/o CP, palpit, SOB, edema, etc...  Hx of CHEST PAIN, ATYPICAL (ICD-786.59) - she was seen in the ER by [name] in 2004 w/ atypical CP... her daughter, [name], is an Nutritional therapist... ~  NuclearStressTest 9/04 was normal- no scar or ischemia,  EF= 73%...  HYPERCHOLESTEROLEMIA (ICD-272.0) - prev on Simva80, but not on much of a diet; Changed to CRESTOR 40mg /d but she stopped this on her own (too $$); asked to take FISH OIL daily. ~  previous labs showed cholesterol as high as 350 in the past... prev on numerous statin meds. ~  FLP 11/08 ?off Lova40 showed TChol 252, TG 236, HDL 49, LDL 163 ~  FLP 10/09 on Simva80 showed TChol 212, TG 124, HDL 64, LDL 119... reminded to take meds regularly. ~  FLP 4/10 on Simva80 showed TChol 206, TG 95, HDL 71, LDL 112... I checked w/ her pharm- filled Simva80 #30 on 2/24 & 4/20. ~  FLP 2/12 on ?Simva80 showed TChol 240, TG 95, HDL 75, LDL 154... rec switch to Cres40. ~  She never ret for FLP on the Crestor & stopped this on her own> encouraged f/u in Lipid Clinic... ~  [name] tried Welchol too but she stopped this as well- ?why...  DIABETES MELLITUS (ICD-250.00) - prev on METFORMIN 500mg Bid & Glimepiride 2mg /d (she stopped on her own "it brings me down to quick") so JANUVIA 100mg /d added by [name]; pt not on diet, not exercising, & "I crave sweets"; she requested Victoza 2/12 & this was prescribed but she stopped it on her own & didn't f/u; [name] tried to incr her Metformin to 1000mg Bid but she decr back to 500Bid on her own; she also started taking Januvia50mg /d "I had samples"... ~  labs 11/08 (wt= 211#) showed BS= 129, HgA1c= 6.6.Marland Kitchen. ~  labs 10/09 (wt= 216#) showed BS= 109, HgA1c= 6.7... ~  labs 4/10 (wt= 211#) showed BS= 127, HgA1c= 7.3.Marland Kitchen. rec> get on diet, incr Metform Bid. ~  labs 8/10 (wt=213#) showed BS= 154, A1c= 7.1 ~  labs 2/12 (wt=203#) showed BS= 167, A1c= 8.0... Januvia ch to VICTOZA 0.6 =>1.2mg  daily; she subseq stopped on her own... ~  12/12:  On Metform500Bid & Januvia 50mg /d she says; wt is down 5# to 198#; she will ret for fasting blood work & f/u w/ [name]...  OBESITY (ICD-278.00) - her weight has been betw 200-215 x yrs... she is 5\' 8"  tall for a BMI~ 32-33... she says  her weight is much lower on her home scales... discussed diet + exercise program required to lose weight...   GERD (ICD-530.81) - on OMEPRAZOLE 20mg /d... she had EGD 11/04 by DrStark w/ 4cmHH, stricture- dilated, gastritis & polyp... ? of COLON POLYPS ? we don't have records from [facility]... ~  she had a normal colonoscopy 11/04 from DrStark x tortuous colon... Rx w/ Levsin Prn. ~  she reports switching gastroenterologist ?DrNatt-Mann w/ EGD & Colon done ?when- she reports several polyps removed.  Hx of URINARY INCONTINENCE (ICD-788.30) - she had a full eval 1/07 by [name] for Urology, and is followed by her  GYN, [name]...  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 uses ACYCLOVIR 400mg  Tid for outbreaks.  DEGENERATIVE JOINT DISEASE (ICD-715.90) - she gets freq shots for bursitis from Ortho. Hx of BACK PAIN, LUMBAR (ICD-724.2) - with prev lumbar laminectomy by Raphael Gibney...  ANXIETY (ICD-300.00) - on ALPRAZOLAM 0.5mg  Tid Prn "I don't use it often"... DEPRESSION (ICD-311) - on ZOLOFT 50mg /d...   Past Surgical History  Procedure Date  . Laminectomy [id number]    s/p-Dr. Jule Ser  . Abdominal hysterectomy 1974    s/p  . Bladder tack 1974  . Cholecystectomy   . Tonsillectomy   . Rotator cuff repair 8/03    right, Dr. Shelle Iron  . Enterocele repair 06/08    Dr. Estanislado Pandy  . Rotator cuff repair 10/26/2008    left, Dr. Rayburn Ma  . Hip surgery 08/27/2010    DR. Despina Hick    . Outpatient Encounter Prescriptions as of 01/14/2011  Medication Sig Dispense Refill  . albuterol (PROVENTIL) (2.5 MG/3ML) 0.083% nebulizer solution 2.5 mg every 4 (four) hours as needed. As needed for shortness of breath.      . ALPRAZolam (XANAX) 0.5 MG tablet 1/2-1 tablet three times a day as needed for nerves  90 tablet  5  . amLODipine (NORVASC) 10 MG tablet Take 1 tablet (10 mg total) by mouth daily.  30 tablet  11  . benazepril (LOTENSIN) 40 MG tablet Take 1 tablet  (40 mg total) by mouth daily.  30 tablet  11  . Cholecalciferol (VITAMIN D) 2000 UNITS tablet Take 2,000 Units by mouth daily.        . hydrochlorothiazide 25 MG tablet 1/2 to 1 tablet by mouth once a day as needed for swelling       . naproxen (NAPROSYN) 250 MG tablet Take 250 mg by mouth daily as needed. As needed for bursitis pain in hip       . omeprazole (PRILOSEC) 20 MG capsule Take 1 capsule (20 mg total) by mouth daily.  30 capsule  11  . potassium chloride SA (K-DUR,KLOR-CON) 20 MEQ tablet Take 20 mEq by mouth daily as needed. (take with the HCTZ)       . sertraline (ZOLOFT) 50 MG tablet Take 1 tablet (50 mg total) by mouth daily.  30 tablet  11  . sitaGLIPtin (JANUVIA) 50 MG tablet Take 50 mg by mouth daily.          Allergies  Allergen Reactions  . Ceftriaxone Sodium     REACTION: hives  . Cephalexin     REACTION: hives  . Clindamycin     REACTION: hives  . Oxycodone-Acetaminophen     REACTION: hives  . Sulfonamide Derivatives     REACTION: hives    Current Medications, Allergies, Past Medical History, Past Surgical History, Family History, and Social History were reviewed in [employer] record.    Review of Systems         The patient complains of dyspnea on exertion, peripheral edema, gas/bloating, indigestion/heartburn, joint pain, muscle cramps, arthritis, itching, and dryness.  The patient denies fever, chills, sweats, anorexia, fatigue, weakness, malaise, weight loss, sleep disorder, blurring, diplopia, eye irritation, eye discharge, vision loss, eye pain, photophobia, earache, ear discharge, tinnitus, decreased hearing, nasal congestion, nosebleeds, sore throat, hoarseness, chest pain, palpitations, syncope, orthopnea, PND, cough, dyspnea at rest, excessive sputum, hemoptysis, wheezing, pleurisy, nausea, vomiting, diarrhea, constipation, change in bowel habits, abdominal pain, melena, hematochezia, jaundice, dysphagia, odynophagia,  dysuria,  hematuria, urinary frequency, urinary hesitancy, nocturia, incontinence, back pain, joint swelling, muscle weakness, stiffness, sciatica, restless legs, leg pain at night, leg pain with exertion, rash, suspicious lesions, paralysis, paresthesias, seizures, tremors, vertigo, transient blindness, frequent falls, frequent headaches, difficulty walking, depression, anxiety, memory loss, confusion, cold intolerance, heat intolerance, polydipsia, polyphagia, polyuria, unusual weight change, abnormal bruising, bleeding, enlarged lymph nodes, urticaria, allergic rash, hay fever, and recurrent infections.     Objective:   Physical Exam      WD, Overweight, 70 y/o WF in NAD... GENERAL:  Alert & oriented; pleasant & cooperative... she is sl anxious... HEENT:  [place]/AT, EOM-wnl, PERRLA, EACs-clear, TMs-wnl, NOSE-clear, THROAT-clear & wnl. NECK:  Supple w/ fairROM; no JVD; normal carotid impulses w/o bruits; no thyromegaly or nodules palpated; no lymphadenopathy. CHEST:  Clear to P & A; without wheezes/ rales/ or rhonchi. HEART:  Regular Rhythm; without murmurs/ rubs/ or gallops. ABDOMEN:  Obese, soft & nontender; normal bowel sounds; no organomegaly or masses detected. EXT: without deformities, mod arthritic changes; no varicose veins/ venous insuffic/ or edema. NEURO:  CN's intact; motor testing normal; sensory testing normal; gait normal & balance OK. DERM:  No lesions noted; no rash etc...  RADIOLOGY DATA:  Reviewed in the EPIC EMR & discussed w/ the patient...  LABORATORY DATA:  Reviewed in the EPIC EMR & discussed w/ the patient...    >>Epic labs reviewed & she is to ret for fasting blood work...   Assessment:     AB>  She had recent URI/ bronchitis that was slow to clear & she notes it took Levaquin to knock it out; still uses NEBULIZER prn at home...  HBP>  Controlled on her 2 meds + the prn diuretic that she seldom takes; we discussed diet, exercise, wt reduction & sodium  restriction...  Hyperchol>  She has been unwilling or unable to stick w/ any medication or to sched f/u visits or lab checks; she is currently on diet alone despite her long hx of severe hypercholesterolemia; she will ret for FLP on diet at her convenience & we will refer to Lipid Clinic...  DM>  She hasn't seen [name] in 10 months & her A1c was ~8 at that time; she had been given Victoza & her Metformin was incr to 1000Bid; in the interim she stopped the Victoza on her own & started taking Januvia 50mg  samples that she got from somewhere & she cut the Metformin back to 500Bid as well; states accucheck are around 150; she will ret for fastinglabs & f/u w/ [name]...  Obesity>  Her weight is down 5# which surprises her since she is not on a diet or exercise program> we discussed both today...  GI> GERD, Colon Polyps>  She is follwed by [facility] but we don't have any notes from her; pt is encouraged to f/u there...  GU> Urinary Incont>  Followed by GYN & Urology...  DJD/ LBP>  Followed by [name] w/ bursitis surg to right hip 7/12...  Anxiety/ Depression>  On Zoloft50 & Alprazolam as needed...     Plan:     Patient's Medications  New Prescriptions   CHLORPHENIRAMINE-HYDROCODONE (TUSSIONEX PENNKINETIC ER) 10-8 MG/5ML LQCR    Take 5 mLs by mouth every 12 (twelve) hours.   METFORMIN (GLUCOPHAGE) 500 MG TABLET    Take 1 tablet (500 mg total) by mouth 2 (two) times daily with a meal.  Previous Medications   ALBUTEROL (PROVENTIL) (2.5 MG/3ML) 0.083% NEBULIZER SOLUTION    2.5 mg every 4 (  four) hours as needed. As needed for shortness of breath.   CHOLECALCIFEROL (VITAMIN D) 2000 UNITS TABLET    Take 2,000 Units by mouth daily.     HYDROCHLOROTHIAZIDE 25 MG TABLET    1/2 to 1 tablet by mouth once a day as needed for swelling    NAPROXEN (NAPROSYN) 250 MG TABLET    Take 250 mg by mouth daily as needed. As needed for bursitis pain in hip    POTASSIUM CHLORIDE SA (K-DUR,KLOR-CON) 20 MEQ  TABLET    Take 20 mEq by mouth daily as needed. (take with the HCTZ)    SITAGLIPTIN (JANUVIA) 50 MG TABLET    Take 50 mg by mouth daily.    Modified Medications   Modified Medication Previous Medication   ALPRAZOLAM (XANAX) 0.5 MG TABLET ALPRAZolam (XANAX) 0.5 MG tablet      1/2-1 tablet three times a day as needed for nerves    Take by mouth. 1/2-1 tablet three times a day as needed for nerves    AMLODIPINE (NORVASC) 10 MG TABLET amLODipine (NORVASC) 10 MG tablet      Take 1 tablet (10 mg total) by mouth daily.    Take 10 mg by mouth daily.     BENAZEPRIL (LOTENSIN) 40 MG TABLET benazepril (LOTENSIN) 40 MG tablet      Take 1 tablet (40 mg total) by mouth daily.    Take 40 mg by mouth daily.     OMEPRAZOLE (PRILOSEC) 20 MG CAPSULE omeprazole (PRILOSEC) 20 MG capsule      Take 1 capsule (20 mg total) by mouth daily.    Take 20 mg by mouth daily.     SERTRALINE (ZOLOFT) 50 MG TABLET sertraline (ZOLOFT) 50 MG tablet      Take 1 tablet (50 mg total) by mouth daily.    Take 50 mg by mouth daily.    Discontinued Medications   ACYCLOVIR (ZOVIRAX) 200 MG CAPSULE    Take 2 tabs by mouth three times daily for 5 days as needed at onset of symptoms   ACYCLOVIR (ZOVIRAX) 5 % OINTMENT    Apply as directed    COLESEVELAM (WELCHOL) 625 MG TABLET    3 tablets once daily    LIRAGLUTIDE (VICTOZA) 18 MG/3ML SOLN    Inject 0.6mg  subcutaneously once daily for 10 days, then increase to 1.2mg  subcutaneously thereafter    METFORMIN (GLUCOPHAGE-XR) 500 MG 24 HR TABLET    2 tablets two times a day   ROSUVASTATIN (CRESTOR) 40 MG TABLET    Take 40 mg by mouth daily.

## 2011-02-02 ENCOUNTER — Telehealth (INDEPENDENT_AMBULATORY_CARE_PROVIDER_SITE_OTHER): Payer: Self-pay | Admitting: Surgery

## 2011-02-04 NOTE — Telephone Encounter (Signed)
Called patient back. Left message on machine for her to call and ask for me. Has a new patient appt for a new breast mass on 02/20/11. Patient will definitely need a mammogram to evaluate this.

## 2011-02-06 ENCOUNTER — Other Ambulatory Visit: Payer: Self-pay | Admitting: Endocrinology

## 2011-02-06 ENCOUNTER — Other Ambulatory Visit (INDEPENDENT_AMBULATORY_CARE_PROVIDER_SITE_OTHER): Payer: Medicare Other

## 2011-02-06 DIAGNOSIS — I10 Essential (primary) hypertension: Secondary | ICD-10-CM

## 2011-02-06 DIAGNOSIS — E7800 Pure hypercholesterolemia, unspecified: Secondary | ICD-10-CM

## 2011-02-06 DIAGNOSIS — E119 Type 2 diabetes mellitus without complications: Secondary | ICD-10-CM

## 2011-02-06 LAB — BASIC METABOLIC PANEL
CO2: 30 mEq/L (ref 19–32)
Chloride: 106 mEq/L (ref 96–112)
Glucose, Bld: 159 mg/dL — ABNORMAL HIGH (ref 70–99)
Potassium: 4.1 mEq/L (ref 3.5–5.1)
Sodium: 143 mEq/L (ref 135–145)

## 2011-02-09 ENCOUNTER — Encounter: Payer: Self-pay | Admitting: Endocrinology

## 2011-02-09 ENCOUNTER — Ambulatory Visit (INDEPENDENT_AMBULATORY_CARE_PROVIDER_SITE_OTHER): Payer: Medicare Other | Admitting: Endocrinology

## 2011-02-09 DIAGNOSIS — E119 Type 2 diabetes mellitus without complications: Secondary | ICD-10-CM

## 2011-02-09 MED ORDER — LIRAGLUTIDE 18 MG/3ML ~~LOC~~ SOLN: 1.8000 mg | Freq: Every day | SUBCUTANEOUS | Status: DC

## 2011-02-09 NOTE — Progress Notes (Signed)
Subjective:    Patient ID: [name], female    DOB: 06-10-1940, 71 y.o.   MRN: [id number]  HPI Pt returns for f/u of type 2 DM (2007).  She takes 2 oral meds as rx'ed.  She was on steroids for a brief time 1-2 mos ago.  She feels much better now.   Past Medical History  Diagnosis Date  . DIABETES MELLITUS 08/10/2007  . 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 HYPERCHOLESTEROLEMIA 12/17/2006  . OBESITY 06/04/2008  . ANXIETY 06/04/2008  . DEPRESSION 08/10/2007  . HYPERTENSION 12/17/2006  . ASTHMATIC BRONCHITIS, ACUTE 06/04/2008  . ALLERGIC RHINITIS 12/13/2007  . GERD 12/17/2006  . DEGENERATIVE JOINT DISEASE 12/17/2006    Past Surgical History  Procedure Date  . Laminectomy [id number]    s/p-Dr. Jule Ser  . Abdominal hysterectomy 1974    s/p  . Bladder tack 1974  . Cholecystectomy   . Tonsillectomy   . Rotator cuff repair 8/03    right, Dr. Shelle Iron  . Enterocele repair 06/08    Dr. Estanislado Pandy  . Rotator cuff repair 10/26/2008    left, Dr. Rayburn Ma  . Hip surgery 08/27/2010    DR. Despina Hick    History   Social History  . Marital Status: Married    Spouse Name: N/A    Number of Children: 3  . Years of Education: N/A   Occupational History  .     Social History Main Topics  . Smoking status: Never Smoker   . Smokeless tobacco: Not on file  . Alcohol Use: Not on file  . Drug Use: No  . Sexually Active: Not on file   Other Topics Concern  . Not on file   Social History Narrative   Positive for second-hand smoke exposureNo exerciseNo caffeineDoes not work outside the home    Current Outpatient Prescriptions on File Prior to Visit  Medication Sig Dispense Refill  . albuterol (PROVENTIL) (2.5 MG/3ML) 0.083% nebulizer solution 2.5 mg every 4 (four) hours as needed. As needed for shortness of breath.      . ALPRAZolam (XANAX) 0.5 MG tablet 1/2-1 tablet three times a day as needed for nerves  90 tablet  5  . amLODipine (NORVASC) 10 MG tablet Take 1 tablet (10 mg total) by mouth daily.  30 tablet  11    . benazepril (LOTENSIN) 40 MG tablet Take 1 tablet (40 mg total) by mouth daily.  30 tablet  11  . Cholecalciferol (VITAMIN D) 2000 UNITS tablet Take 2,000 Units by mouth daily.        . hydrochlorothiazide 25 MG tablet 1/2 to 1 tablet by mouth once a day as needed for swelling       . metFORMIN (GLUCOPHAGE) 500 MG tablet Take 1 tablet (500 mg total) by mouth 2 (two) times daily with a meal.  60 tablet  5  . naproxen (NAPROSYN) 250 MG tablet Take 250 mg by mouth daily as needed. As needed for bursitis pain in hip       . omeprazole (PRILOSEC) 20 MG capsule Take 1 capsule (20 mg total) by mouth daily.  30 capsule  11  . potassium chloride SA (K-DUR,KLOR-CON) 20 MEQ tablet Take 20 mEq by mouth daily as needed. (take with the HCTZ)       . sertraline (ZOLOFT) 50 MG tablet Take 1 tablet (50 mg total) by mouth daily.  30 tablet  11    Allergies  Allergen Reactions  . Ceftriaxone Sodium     REACTION: hives  .  Cephalexin     REACTION: hives  . Clindamycin     REACTION: hives  . Oxycodone-Acetaminophen     REACTION: hives  . Rocephin (Ceftriaxone Sodium In Dextrose) Hives  . Sulfonamide Derivatives     REACTION: hives    Family History  Problem Relation Age of Onset  . COPD Mother   . Arthritis Mother   . Emphysema Mother   . Mental illness Paternal Grandfather   . Heart disease Other     Sibling  . Diabetes Other     Sibling   . Diabetes Brother   . Cancer Other     Prostate Cancer   BP 144/80  Pulse 56  Temp(Src) 97.1 F (36.2 C) (Oral)  Ht 5\' 5"  (1.651 m)  Wt 195 lb 1.9 oz (88.506 kg)  BMI 32.47 kg/m2  SpO2 96%   Review of Systems She has lot a few lbs, due to her efforts.      Objective:   Physical Exam VITAL SIGNS:  See vs page GENERAL: no distress.  Pulses: dorsalis pedis intact bilat.   Feet: no deformity.  no ulcer on the feet.  feet are of normal color and temp.  no edema Neuro: sensation is intact to touch on the feet   Lab Results  Component Value  Date   HGBA1C 8.3* 02/06/2011      Assessment & Plan:  DM, needs increased rx.

## 2011-02-09 NOTE — Telephone Encounter (Signed)
No call back. Appt left as it is scheduled.

## 2011-02-09 NOTE — Patient Instructions (Addendum)
You should take "victoza" pen, once a day.  The side-effect is nausea, which goes away with time.  To avoid this side-effect, start with the lowest (0.6) setting.  After a few days, increase to 1.2.  If you still have little or no nausea, increase to the highest (1.8) setting, and continue that setting.  Here is a discount card.  This medication replaces the "[place]." If this is very expensive on your insurance, please call back, and i would change it back to [place], and add "actos."   Please come back for a follow-up appointment in 3 months.

## 2011-02-09 NOTE — Telephone Encounter (Signed)
Spoke with patient. Has had this lump for a few months. Made aware I did not have any sooner appts. Made aware he will probably order a mgm or ultrasound to evaluate this area. Another MD has not evaluated this area. Patient will keep her appt for evaluation.

## 2011-02-12 ENCOUNTER — Ambulatory Visit (INDEPENDENT_AMBULATORY_CARE_PROVIDER_SITE_OTHER): Payer: Medicare Other | Admitting: Pharmacist

## 2011-02-12 VITALS — BP 138/82 | HR 62 | Wt 195.1 lb

## 2011-02-12 DIAGNOSIS — E7800 Pure hypercholesterolemia, unspecified: Secondary | ICD-10-CM

## 2011-02-12 MED ORDER — ATORVASTATIN CALCIUM 40 MG PO TABS: 40.0000 mg | ORAL_TABLET | Freq: Every day | ORAL | Status: DC

## 2011-02-12 NOTE — Progress Notes (Signed)
Subjective: 71 yo F seen today in the lipid clinic for management of her hyperlipidemia. Her lipid panel is elevated at this time. She has PMH of Diabetes, hypertension, GERD, anxiety/depression. HPI: patient was previously treated for hyperlipidemia with zocor 80 mg daily and patient had no issues, medication was changed to crestor 40 mg for a more significant cholesterol reduction. [name] stopped taking the Crestor due to cost issues and has now been off  cholesterol medication for the last "4-5 months." She states being started on Victoza for blood glucose control associated with diabetes.    [name] states not being on an exercise regimen or a diet plan. She states she has a poor appetite and sometimes "forgets to eat." She usually does not eat anything for breakfast, except occasionally some oatmeal or toast. Lunch usually consists of sandwiches. Dinner is her large meal of the day and varies. She cooks roast often as well as green beans, vegetables, potatoes, sweet potatoes, biscuits and gravy, salads, vegetable soup, and country ham.  Objective: Labs:       Total cholesterol: 305      Triglycerides: 272      HDL: 43      LDL: 207.6 Current Outpatient Prescriptions on File Prior to Visit  Medication Sig Dispense Refill  . albuterol (PROVENTIL) (2.5 MG/3ML) 0.083% nebulizer solution 2.5 mg every 4 (four) hours as needed. As needed for shortness of breath.      . ALPRAZolam (XANAX) 0.5 MG tablet 1/2-1 tablet three times a day as needed for nerves  90 tablet  5  . amLODipine (NORVASC) 10 MG tablet Take 1 tablet (10 mg total) by mouth daily.  30 tablet  11  . benazepril (LOTENSIN) 40 MG tablet Take 1 tablet (40 mg total) by mouth daily.  30 tablet  11  . Cholecalciferol (VITAMIN D) 2000 UNITS tablet Take 2,000 Units by mouth daily.        . hydrochlorothiazide 25 MG tablet 1/2 to 1 tablet by mouth once a day as needed for swelling       . Liraglutide (VICTOZA) 18 MG/3ML SOLN Inject  0.3 mLs (1.8 mg total) into the skin daily.  6 mL  11  . metFORMIN (GLUCOPHAGE) 500 MG tablet Take 1 tablet (500 mg total) by mouth 2 (two) times daily with a meal.  60 tablet  5  . naproxen (NAPROSYN) 250 MG tablet Take 250 mg by mouth daily as needed. As needed for bursitis pain in hip       . omeprazole (PRILOSEC) 20 MG capsule Take 1 capsule (20 mg total) by mouth daily.  30 capsule  11  . potassium chloride SA (K-DUR,KLOR-CON) 20 MEQ tablet Take 20 mEq by mouth daily as needed. (take with the HCTZ)       . sertraline (ZOLOFT) 50 MG tablet Take 1 tablet (50 mg total) by mouth daily.  30 tablet  11   Allergies  Allergen Reactions  . Ceftriaxone Sodium     REACTION: hives  . Cephalexin     REACTION: hives  . Clindamycin     REACTION: hives  . Oxycodone-Acetaminophen     REACTION: hives  . Rocephin (Ceftriaxone Sodium In Dextrose) Hives  . Sulfonamide Derivatives     REACTION: hives

## 2011-02-12 NOTE — Assessment & Plan Note (Signed)
Assessment: Cholesterol elevated since being off medication. Patient had no issues tolerating statins previously. Will restart therapy with Lipitor 40 mg to control cholesterol and recheck in 2 months. If cholesterol still uncontrolled will try Lipitor 80 mg (LDL goal <100 if possible ultimately <70). Discuss diet and exercise plans and she seems willing to work on diet and increase her amount of exercise.   Plan: 1) Start Lipitor 40 mg PO daily for hyperlipidemia  2) Diet: encouraged to try and space meals out throughout the day and eat smaller meals about every 3-4 hrs. Decrease amount of soda and increase amount of water.  3) Increase exercise to moderate exercise (walking) 10-15 minutes 3 days a week (goal 30 minutes of exercise 5 days a week ultimately)  4) Return for f/u in 2 months on March 11th at 1:30pm  5) Labs to be drawn (lipid and hepatic panel) on Wednesday March 6th

## 2011-02-12 NOTE — Patient Instructions (Addendum)
Start Lipitor 40mg  daily.  If you have any problems, please call Kennon Rounds at [phone].   Try to start walking for 10-15 minutes 2-3 days a week.   Try to start eating a small meal every 3-4 hours throughout the day.  Limit your soft drinks and orange juice intake.  Try to drink more water with lemon or Crystal Light.    We will recheck labs in 2 months.

## 2011-02-20 ENCOUNTER — Other Ambulatory Visit: Payer: Self-pay | Admitting: Diagnostic Radiology

## 2011-02-20 ENCOUNTER — Ambulatory Visit
Admission: RE | Admit: 2011-02-20 | Discharge: 2011-02-20 | Disposition: A | Discharge status: Home / Self Care | Payer: Medicare Other | Source: Ambulatory Visit | Attending: Surgery | Admitting: Surgery

## 2011-02-20 ENCOUNTER — Ambulatory Visit (INDEPENDENT_AMBULATORY_CARE_PROVIDER_SITE_OTHER): Payer: Medicare Other | Admitting: Surgery

## 2011-02-20 ENCOUNTER — Other Ambulatory Visit (INDEPENDENT_AMBULATORY_CARE_PROVIDER_SITE_OTHER): Payer: Self-pay | Admitting: Surgery

## 2011-02-20 ENCOUNTER — Encounter (INDEPENDENT_AMBULATORY_CARE_PROVIDER_SITE_OTHER): Payer: Self-pay | Admitting: Surgery

## 2011-02-20 VITALS — BP 124/76 | HR 78 | Resp 16 | Ht 68.0 in | Wt 192.0 lb

## 2011-02-20 DIAGNOSIS — C50312 Malignant neoplasm of lower-inner quadrant of left female breast: Secondary | ICD-10-CM | POA: Insufficient documentation

## 2011-02-20 DIAGNOSIS — N6320 Unspecified lump in the left breast, unspecified quadrant: Secondary | ICD-10-CM

## 2011-02-20 DIAGNOSIS — C50319 Malignant neoplasm of lower-inner quadrant of unspecified female breast: Secondary | ICD-10-CM

## 2011-02-20 DIAGNOSIS — N63 Unspecified lump in unspecified breast: Secondary | ICD-10-CM

## 2011-02-20 HISTORY — DX: Malignant neoplasm of lower-inner quadrant of unspecified female breast: C50.319

## 2011-02-20 NOTE — Progress Notes (Signed)
NAME: [name]                                                                                      DOB: 1940/09/18 DATE: 02/20/2011               MRN: [id number]   CC:  Chief Complaint  Patient presents with  . Breast Mass    Eval left breast mass    HPI:  [name] is a 71 y.o.  female who was referred  by Dr. Otho Bellows evaluation of A left breast mass. The patient is present for several months. She is a history of prior bilateral breast biopsies for fibrocystic changes in she thought this felt similar to what she had the past. However it is not gotten any smaller and perhaps larger. It is not painful. She's not had any nipple changes or skin changes. She's not had a mammogram for over six years. She has a negative family history for breast cancer.  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  PSH:   has past surgical history that includes Laminectomy (4098); Abdominal hysterectomy (1974); bladder tack (1974); Cholecystectomy; Tonsillectomy; Rotator cuff repair (8/03); Enterocele repair (06/08); Rotator cuff repair (10/26/2008); and Hip surgery (08/27/2010).  ALLERGIES:   Allergies  Allergen Reactions  . Ceftriaxone Sodium     REACTION: hives  . Cephalexin     REACTION: hives  . Clindamycin     REACTION: hives  . Oxycodone-Acetaminophen     REACTION: hives  . Rocephin (Ceftriaxone Sodium In Dextrose) Hives  . Sulfonamide Derivatives     REACTION: hives    MEDICATIONS: Current outpatient prescriptions:albuterol (PROVENTIL) (2.5 MG/3ML) 0.083% nebulizer solution, 2.5 mg every 4 (four) hours as needed. As needed for shortness of breath., Disp: , Rfl: ;  ALPRAZolam (XANAX) 0.5  MG tablet, 1/2-1 tablet three times a day as needed for nerves, Disp: 90 tablet, Rfl: 5;  amLODipine (NORVASC) 10 MG tablet, Take 1 tablet (10 mg total) by mouth daily., Disp: 30 tablet, Rfl: 11 atorvastatin (LIPITOR) 40 MG tablet, Take 1 tablet (40 mg total) by mouth daily., Disp: 30 tablet, Rfl: 11;  benazepril (LOTENSIN) 40 MG tablet, Take 1 tablet (40 mg total) by mouth daily., Disp: 30 tablet, Rfl: 11;  Cholecalciferol (VITAMIN D) 2000 UNITS tablet, Take 2,000 Units by mouth daily.  , Disp: , Rfl: ;  hydrochlorothiazide 25 MG tablet, 1/2 to 1 tablet by mouth once a day as needed for swelling , Disp: , Rfl:  Liraglutide (VICTOZA) 18 MG/3ML SOLN, Inject 0.3 mLs (1.8 mg total) into the skin daily., Disp: 6 mL, Rfl: 11;  metFORMIN (GLUCOPHAGE) 500 MG tablet, Take 1 tablet (500 mg total) by mouth 2 (two) times daily with a meal., Disp: 60 tablet, Rfl: 5;  naproxen (NAPROSYN) 250 MG tablet, Take 250 mg by mouth daily as needed. As needed for bursitis pain in hip , Disp: , Rfl:  omeprazole (PRILOSEC) 20 MG capsule, Take 1 capsule (20  mg total) by mouth daily., Disp: 30 capsule, Rfl: 11;  potassium chloride SA (K-DUR,KLOR-CON) 20 MEQ tablet, Take 20 mEq by mouth daily as needed. (take with the HCTZ) , Disp: , Rfl: ;  sertraline (ZOLOFT) 50 MG tablet, Take 1 tablet (50 mg total) by mouth daily., Disp: 30 tablet, Rfl: 11  ROS: basically negative per our ROS form  EXAM:   GENERAL:  The patient is alert, oriented, and generally healthy-appearing, NAD. Mood and affect are normal.  HEENT:  The head is normocephalic, the eyes nonicteric, the pupils were round regular and equal. EOMs are normal. Pharynx normal. Dentition good.  NECK:  The neck is supple and there are no masses or thyromegaly.  LUNGS: Normal respirations and clear to auscultation.  HEART: Regular rhythm, with no murmurs rubs or gallops. Pulses are intact carotid dorsalis pedis and posterior tibial. No significant varicosities are  noted.  BREASTS:  The right breast is normal. There are multiple surgical scars all well healed. The left breast shows some dimpling in the lower outer quadrant. There is a hard mass under this area it measures at least 4 cm. It may be fixed to the skin that is movable deep. There is no redness or edema of the skin.  LYMPHATICS: There is no right axillary, bilateral supraclavicular adenopathy. However, I suspect she has some enlarged left axillary nodes.  ABDOMEN: Soft, flat, and nontender. No masses or organomegaly is noted. No hernias are noted. Bowel sounds are normal.  EXTREMITIES:  Good range of motion, no edema.  DATA REVIEWED:  I have reviewed the information in Epic  IMPRESSION:  I think this is a breast cancer  PLAN:   Se will have a mammogram, ultrasound, and probable biopsy at the breast center today  Lindbergh Winkles J 02/20/2011  CC: Michele Mcalpine, MD, Michele Mcalpine, MD, MD

## 2011-02-20 NOTE — Patient Instructions (Signed)
We will arrange today for a mammogram and ultrasound of the area in the left breast that feels abnormal. The radiologist may also need to do a biopsy. If a biopsy is done today we should have the results by Monday.

## 2011-02-21 ENCOUNTER — Encounter (INDEPENDENT_AMBULATORY_CARE_PROVIDER_SITE_OTHER): Payer: Self-pay | Admitting: Surgery

## 2011-02-23 ENCOUNTER — Other Ambulatory Visit (INDEPENDENT_AMBULATORY_CARE_PROVIDER_SITE_OTHER): Payer: Self-pay | Admitting: Surgery

## 2011-02-23 ENCOUNTER — Telehealth (INDEPENDENT_AMBULATORY_CARE_PROVIDER_SITE_OTHER): Payer: Self-pay

## 2011-02-23 ENCOUNTER — Ambulatory Visit
Admission: RE | Admit: 2011-02-23 | Discharge: 2011-02-23 | Disposition: A | Discharge status: Home / Self Care | Payer: Medicare Other | Source: Ambulatory Visit | Attending: Surgery | Admitting: Surgery

## 2011-02-23 DIAGNOSIS — N63 Unspecified lump in unspecified breast: Secondary | ICD-10-CM

## 2011-02-23 DIAGNOSIS — C50912 Malignant neoplasm of unspecified site of left female breast: Secondary | ICD-10-CM

## 2011-02-23 NOTE — Telephone Encounter (Signed)
Can you see her anytime sooner this week?

## 2011-02-23 NOTE — Telephone Encounter (Signed)
Pt has an appointment Friday with Dr Jamey Ripa but is very anxious about current diagnosis.  Let her know if there is any way to move her up sooner this week.  She is a previous pt of Dr Jamey Ripa.

## 2011-02-24 ENCOUNTER — Telehealth: Payer: Self-pay | Admitting: *Deleted

## 2011-02-24 ENCOUNTER — Telehealth (INDEPENDENT_AMBULATORY_CARE_PROVIDER_SITE_OTHER): Payer: Self-pay | Admitting: Surgery

## 2011-02-24 ENCOUNTER — Telehealth: Payer: Self-pay | Admitting: Pulmonary Disease

## 2011-02-24 DIAGNOSIS — C50319 Malignant neoplasm of lower-inner quadrant of unspecified female breast: Secondary | ICD-10-CM

## 2011-02-24 NOTE — Telephone Encounter (Signed)
I spoke with pt and she states she was dx w/ breast cancer yesterday and will see Dr. Strict on Friday at 8:30. Pt states also wanted to let SN know she is going to have an MRI Friday afternoon as well. Will forward to SN as an [place]

## 2011-02-24 NOTE — Telephone Encounter (Signed)
I called her today with her pathology report. She has an invasive lobular carcinoma. Breast prognostic indicators are still pending. I'm going to arrange for her to see both a medical and radiation oncologist as I think she is likely to need both. She has her MRI later this week and that may help determine the extent of the cancer. However, initial exam, thought it was fairly large.

## 2011-02-24 NOTE — Telephone Encounter (Signed)
Pt left message to inform MD that she was diagnosed with breast cancer yesterday and wanted him to be aware.

## 2011-02-27 ENCOUNTER — Ambulatory Visit (INDEPENDENT_AMBULATORY_CARE_PROVIDER_SITE_OTHER): Payer: Medicare Other | Admitting: Surgery

## 2011-02-27 ENCOUNTER — Encounter (INDEPENDENT_AMBULATORY_CARE_PROVIDER_SITE_OTHER): Payer: Self-pay | Admitting: Surgery

## 2011-02-27 ENCOUNTER — Ambulatory Visit
Admission: RE | Admit: 2011-02-27 | Discharge: 2011-02-27 | Disposition: A | Discharge status: Home / Self Care | Payer: Medicare Other | Source: Ambulatory Visit | Attending: Surgery | Admitting: Surgery

## 2011-02-27 VITALS — BP 146/88 | HR 72 | Temp 96.8°F | Resp 18 | Ht 68.0 in | Wt 192.8 lb

## 2011-02-27 DIAGNOSIS — C50912 Malignant neoplasm of unspecified site of left female breast: Secondary | ICD-10-CM

## 2011-02-27 DIAGNOSIS — C50319 Malignant neoplasm of lower-inner quadrant of unspecified female breast: Secondary | ICD-10-CM

## 2011-02-27 MED ORDER — GADOBENATE DIMEGLUMINE 529 MG/ML IV SOLN
18.0000 mL | Freq: Once | INTRAVENOUS | Status: AC | PRN
  Administered 2011-02-27: 18 mL via INTRAVENOUS

## 2011-02-27 NOTE — Progress Notes (Signed)
Chief complaint: Newly diagnosed left breast cancer, invasive lobular, left breast lower outer quadrant.  History of present illness: I saw this patient last week with a palpable left breast mass. She was sent for mammograms and biopsy and has invasive lobular carcinoma. She came in today to discuss treatment options.  Exam: She is minimal bruising from the biopsy.  Impression: Clinical stage II left breast cancer (lobular) left breast lower outer quadrant  Plan: I need to have her MRI report and make a full decision. I think she needs to see a medical oncologist preoperatively so that if she is going to have chemotherapy postoperatively I can place a porta time of surgery. If it is unclear whether she needs a port and then we can simply do the surgery.  I spent 30 minutes with the patient and family discussing alternatives of treatment including mastectomy lumpectomy site no dissections, sentinel lymph node dissections, possible need for chemotherapy, possible need for radiation therapy. We've also discussed the pathophysiology of breast cancer and explain the type she has et Karie Soda. I think all questions were answered. The patient is very anxious and wishes to proceed as soon as possible with decision making and surgery.

## 2011-02-27 NOTE — Patient Instructions (Signed)
I will call you after your MRI report is available to discuss options for surgery. We will contact the cancer center to make an appointment with Dr Welton Flakes

## 2011-03-02 ENCOUNTER — Telehealth (INDEPENDENT_AMBULATORY_CARE_PROVIDER_SITE_OTHER): Payer: Self-pay | Admitting: Surgery

## 2011-03-02 ENCOUNTER — Telehealth (INDEPENDENT_AMBULATORY_CARE_PROVIDER_SITE_OTHER): Payer: Self-pay | Admitting: General Surgery

## 2011-03-02 ENCOUNTER — Telehealth: Payer: Self-pay | Admitting: *Deleted

## 2011-03-02 NOTE — Telephone Encounter (Signed)
Confirmed 03/03/11 appt w/ pt.  Unable to mail before letter & packet - gave verbal.

## 2011-03-02 NOTE — Telephone Encounter (Signed)
See my telephone encounter note

## 2011-03-02 NOTE — Telephone Encounter (Signed)
Patient called and left voicemail x2 for MR results and wants to know future plan. Please advise.

## 2011-03-02 NOTE — Telephone Encounter (Signed)
I called the patient today about her MRI report. The tumor looks like it is 5.5 cm in maximum size. She might be a candidate for a lumpectomy. However she is thinking about mastectomy. She does not yet have her medical oncology appointment. Once that is done we can make more definitive plans.

## 2011-03-03 ENCOUNTER — Ambulatory Visit (HOSPITAL_BASED_OUTPATIENT_CLINIC_OR_DEPARTMENT_OTHER): Payer: Medicare Other | Admitting: Oncology

## 2011-03-03 ENCOUNTER — Other Ambulatory Visit (INDEPENDENT_AMBULATORY_CARE_PROVIDER_SITE_OTHER): Payer: Self-pay | Admitting: Surgery

## 2011-03-03 ENCOUNTER — Ambulatory Visit: Payer: Medicare Other

## 2011-03-03 ENCOUNTER — Other Ambulatory Visit: Payer: Self-pay | Admitting: *Deleted

## 2011-03-03 ENCOUNTER — Telehealth: Payer: Self-pay | Admitting: Oncology

## 2011-03-03 ENCOUNTER — Other Ambulatory Visit (HOSPITAL_BASED_OUTPATIENT_CLINIC_OR_DEPARTMENT_OTHER): Payer: Medicare Other | Admitting: Lab

## 2011-03-03 VITALS — BP 143/72 | HR 69 | Temp 97.5°F | Ht 68.0 in | Wt 198.0 lb

## 2011-03-03 DIAGNOSIS — C50519 Malignant neoplasm of lower-outer quadrant of unspecified female breast: Secondary | ICD-10-CM

## 2011-03-03 DIAGNOSIS — C50319 Malignant neoplasm of lower-inner quadrant of unspecified female breast: Secondary | ICD-10-CM

## 2011-03-03 DIAGNOSIS — C50919 Malignant neoplasm of unspecified site of unspecified female breast: Secondary | ICD-10-CM

## 2011-03-03 LAB — COMPREHENSIVE METABOLIC PANEL
Albumin: 3.8 g/dL (ref 3.5–5.2)
CO2: 27 mEq/L (ref 19–32)
Glucose, Bld: 237 mg/dL — ABNORMAL HIGH (ref 70–99)
Sodium: 139 mEq/L (ref 135–145)
Total Bilirubin: 0.4 mg/dL (ref 0.3–1.2)
Total Protein: 6.4 g/dL (ref 6.0–8.3)

## 2011-03-03 LAB — CBC WITH DIFFERENTIAL/PLATELET
Eosinophils Absolute: 0.1 10*3/uL (ref 0.0–0.5)
HCT: 41.2 % (ref 34.8–46.6)
HGB: 13.9 g/dL (ref 11.6–15.9)
LYMPH%: 42.2 % (ref 14.0–49.7)
MONO#: 0.6 10*3/uL (ref 0.1–0.9)
NEUT#: 2.7 10*3/uL (ref 1.5–6.5)
Platelets: 249 10*3/uL (ref 145–400)
RBC: 4.79 10*6/uL (ref 3.70–5.45)
WBC: 5.9 10*3/uL (ref 3.9–10.3)

## 2011-03-03 NOTE — Progress Notes (Signed)
Reviewed notes from Dr Welton Flakes - she wishes to proceed to a mastectomy with a delayed reconstruction. It is not clear if she will need chemo so no port at surgery

## 2011-03-03 NOTE — Telephone Encounter (Signed)
gve the pt her feb 2013 appt calendar along with the appt with dr Dayton Scrape for rad onc. Pt requested to wait until after her surgery

## 2011-03-04 ENCOUNTER — Encounter: Payer: Self-pay | Admitting: Radiation Oncology

## 2011-03-04 ENCOUNTER — Ambulatory Visit
Admission: RE | Admit: 2011-03-04 | Discharge: 2011-03-04 | Disposition: A | Discharge status: Home / Self Care | Payer: Medicare Other | Source: Ambulatory Visit | Attending: Radiation Oncology | Admitting: Radiation Oncology

## 2011-03-04 ENCOUNTER — Encounter: Payer: Self-pay | Admitting: *Deleted

## 2011-03-04 VITALS — BP 122/73 | HR 67 | Temp 97.0°F | Resp 18 | Ht 68.0 in | Wt 197.4 lb

## 2011-03-04 DIAGNOSIS — Z9010 Acquired absence of unspecified breast and nipple: Secondary | ICD-10-CM | POA: Insufficient documentation

## 2011-03-04 DIAGNOSIS — E7800 Pure hypercholesterolemia, unspecified: Secondary | ICD-10-CM | POA: Insufficient documentation

## 2011-03-04 DIAGNOSIS — E669 Obesity, unspecified: Secondary | ICD-10-CM | POA: Insufficient documentation

## 2011-03-04 DIAGNOSIS — C50519 Malignant neoplasm of lower-outer quadrant of unspecified female breast: Secondary | ICD-10-CM | POA: Insufficient documentation

## 2011-03-04 DIAGNOSIS — C50319 Malignant neoplasm of lower-inner quadrant of unspecified female breast: Secondary | ICD-10-CM

## 2011-03-04 DIAGNOSIS — Z809 Family history of malignant neoplasm, unspecified: Secondary | ICD-10-CM | POA: Insufficient documentation

## 2011-03-04 DIAGNOSIS — Z79899 Other long term (current) drug therapy: Secondary | ICD-10-CM | POA: Insufficient documentation

## 2011-03-04 DIAGNOSIS — I10 Essential (primary) hypertension: Secondary | ICD-10-CM | POA: Insufficient documentation

## 2011-03-04 DIAGNOSIS — C773 Secondary and unspecified malignant neoplasm of axilla and upper limb lymph nodes: Secondary | ICD-10-CM | POA: Insufficient documentation

## 2011-03-04 DIAGNOSIS — E119 Type 2 diabetes mellitus without complications: Secondary | ICD-10-CM | POA: Insufficient documentation

## 2011-03-04 NOTE — Progress Notes (Signed)
Please see the Nurse Progress Note in the MD Initial Consult Encounter for this patient. 

## 2011-03-04 NOTE — Progress Notes (Signed)
Complete PATIENT MEASURE OF DISTRESS worksheet with a score of ten submitted to social work. Patient denies need to see social work today because she "just needs to adjust to her illness." Also, complete NUTRITION RISK SCREEN worksheet without concerns submitted to Zenovia Jarred, RD.

## 2011-03-04 NOTE — Progress Notes (Signed)
Patient presents to the clinic today accompanied by her son for an initial consultation with Dr. Dayton Scrape. Patient is alert and oriented to person, place, and time. No distress noted. Steady gait noted. Pleasant affect noted. Patient denies pain at this time. Patient denies nausea, vomiting, or headache. Patient reports that she has lost 10 pounds within the last month. Patient reports a decreased appetite. Patient reports episodes of diarrhea. Patient reports that she has not been sleeping well recently. Correction of progress note from 0934 today....patient palpated mass and went directly to Dr. Jamey Ripa because she had seen him in the past. Reported all findings to Dr. Dayton Scrape.

## 2011-03-04 NOTE — Progress Notes (Signed)
71 year old white female. Married. Gravida 5, para 3. Menarche 71 years old. Patient has undergone multiple breast biopsies, all of which have been benign and occurred bilaterally.   Patient presented with a palpable left breast mass to Dr. Otho Bellows. Dr. Otho Bellows referred her to Dr. Gaston Islam for evaluation. Dr. Gaston Islam sent her for mammogram and biopsy. Biopsy from 02/20/2011 revealed invasive lobular carcinoma ER, PR positive and HER-2 without amplification. Proliferation marker 18%.  Dr. Lamount Cranker 03/03/2011 note indicates patient wishes to proceed with a mastectomy and a delayed reconstruction. No port will be placed during surgery because it is not clear if she will need chemotherapy.   Scheduled to follow up with Dr. Welton Flakes 03/17/2011  AX: Cleocin-hives; Keflex-hives No hx of radiation therapy No indication of a pacemaker

## 2011-03-04 NOTE — Progress Notes (Signed)
Encounter addended by: Ardell Isaacs, RN on: 03/04/2011  4:20 PM<BR>     Documentation filed: Charges VN

## 2011-03-04 NOTE — Progress Notes (Signed)
[facility] Radiation Oncology NEW PATIENT EVALUATION  Name: [name] MRN: [id number]  Date: 03/04/2011  DOB: 1940-11-02  Status: outpatient   CC: Michele Mcalpine, MD, MD  Victorino December., MD , Gwendlyn Deutscher, MD   REFERRING PHYSICIAN: Victorino December., MD   DIAGNOSIS: Clinical stage IIB (T3, N0, M0) invasive lobular carcinoma of the left breast    HISTORY OF PRESENT ILLNESS:  [name] is a 71 y.o. female who is seen today for the courtesy of Dr. Welton Flakes for evaluation of her stage IIB (T3, N0, M0) invasive lobular carcinoma of the left breast. She states that she first noted a lateral left breast mass approximately 5-6 months ago. She felt that this represented fibrocystic disease since she had undergone multiple breast biopsies in the past and Dr. Jamey Ripa all of which have been benign. The mass did not go away and she saw Dr. Jamey Ripa in consultation. She was sent for mammography on 02/20/2011. An irregular mass was seen along the lateral aspect of left breast. Ultrasound showed a 3.0 x 1.6 x 2.5 cm mass at 4:00, 5 cm from the nipple. Ultrasound-guided biopsy the same day was diagnostic for invasive lobular carcinoma which was strongly ER/PR positive at 100% each with a proliferation marker/Ki-67 of 18%. She was HER-2/neu negative. Breast MRI on 10/25 2013 showed a 3.7 x 2.8 x 5.5 cm mass at 3 to 4:00 within the left breast. She was seen in consultation by Dr. Welton Flakes yesterday but I do not have her note. At this time she is leaning towards a left modified radical mastectomy and sentinel lymph node biopsy. She is quite anxious.   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     PAST SURGICAL HISTORY:  Past Surgical History  Procedure Date  . Laminectomy [id number]    s/p-Dr. Jule Ser  . Bladder tack 1974  . Cholecystectomy 02/23/1991    [facility] - Dr Jamey Ripa  . Tonsillectomy   . Rotator cuff repair 8/03    right, Dr. Shelle Iron  . Enterocele repair 06/08    Dr. Estanislado Pandy  . Rotator cuff repair 10/26/2008    left, Dr. Rayburn Ma  . Hip surgery 08/27/2010    DR. Despina Hick  . Breast excisional biopsy 11/10/1988    left benign Dr Jamey Ripa  . Breast excisional biopsy 03/04/1983    Right - two areas - Dr Jamey Ripa  . Breast excisional biopsy 10/09/1980    right - Dr Jamey Ripa  . Breast excisional biopsy 10/18/1979    left - Dr Jamey Ripa  . Breast excisional biopsy 01/02/1994    right - Dr Jamey Ripa  . Vaginal hysterectomy 1974    partial      FAMILY HISTORY: family history includes Arthritis in her mother; COPD in her mother; Cancer in her brother and father; Diabetes in her brother and other; Emphysema in her mother; Heart disease in her other; and Mental illness in her paternal grandfather. Her mother died following a stroke at 87. Her father died at age 17 from a brain tumor.   SOCIAL HISTORY:  reports that she has never smoked. She has never used smokeless tobacco. She reports that she does not drink alcohol or use illicit drugs.  Married, 3 children and 2 stepchildren. She worked as a Diplomatic Services operational officer.   ALLERGIES: Ceftriaxone sodium; Cephalexin; Clindamycin; Oxycodone-acetaminophen; Rocephin; and Sulfonamide derivatives   MEDICATIONS:  Current Outpatient Prescriptions  Medication Sig Dispense Refill  . albuterol (PROVENTIL) (2.5 MG/3ML) 0.083% nebulizer solution 2.5 mg every 4 (four) hours as needed. As needed for shortness of breath.      . ALPRAZolam (XANAX) 0.5 MG tablet 1/2-1 tablet three times a day as needed for nerves  90 tablet  5  . amLODipine (NORVASC) 10  MG tablet Take 1 tablet (10 mg total) by mouth daily.  30 tablet  11  . atorvastatin (LIPITOR) 40 MG tablet Take 1 tablet (40 mg total) by mouth daily.  30 tablet  11  . benazepril (LOTENSIN) 40 MG tablet Take 1 tablet (40 mg total) by mouth daily.  30 tablet  11  . Cholecalciferol (VITAMIN D) 2000 UNITS tablet Take 2,000 Units by mouth daily.        . hydrochlorothiazide 25 MG tablet 1/2 to 1 tablet by mouth once a day as needed for swelling       . metFORMIN (GLUCOPHAGE) 500 MG tablet Take 1 tablet (500 mg total) by mouth 2 (two) times daily with a meal.  60 tablet  5  . omeprazole (PRILOSEC) 20 MG capsule Take 1 capsule (20 mg total) by mouth daily.  30 capsule  11  . potassium chloride SA (K-DUR,KLOR-CON) 20 MEQ tablet Take 20 mEq by mouth daily as needed. (take with the HCTZ)       . sertraline (ZOLOFT) 50 MG tablet Take 1 tablet (50 mg total) by mouth daily.  30 tablet  11  . sitaGLIPtin (JANUVIA) 50 MG tablet Take 50 mg by mouth daily.      . naproxen (NAPROSYN) 250 MG tablet Take 250 mg by mouth daily as needed. As needed for bursitis pain in hip           REVIEW OF SYSTEMS:  Pertinent items are noted in HPI.    PHYSICAL EXAM:  height is 5\' 8"  (1.727 m) and weight is 197 lb 6.4 oz (89.54 kg). Her oral temperature is 97 F (36.1 C). Her blood pressure is 122/73 and her pulse is 67. Her respiration is 18.   And neck examination: Grossly unremarkable. Nodes: Without palpable cervical, supraclavicular, or axillary lymphadenopathy. Chest: Lungs clear. Back: Without spinal or CVA tenderness. Heart: Regular in rhythm.: There is a 4 cm mass palpable along the left breast at 4:00. The mass is mobile. There no skin changes. The breast is generous in size. Right breast without masses or lesions. There are multiple scars along both breasts from previous biopsies. Abdomen without hepatomegaly. Extremities: Without edema. Neurologic examination: Grossly nonfocal.   LABORATORY DATA:  Lab Results    Component Value Date   WBC 5.9 03/03/2011   HGB 13.9 03/03/2011   HCT 41.2 03/03/2011   MCV 86.1 03/03/2011   PLT 249 03/03/2011   Lab Results  Component Value Date   NA 139 03/03/2011   K 3.7 03/03/2011   CL 101 03/03/2011   CO2 27 03/03/2011   Lab Results  Component Value Date   ALT 24 03/03/2011   AST 24 03/03/2011   ALKPHOS 100 03/03/2011   BILITOT 0.4 03/03/2011      IMPRESSION: Clinical stage IIb (T3, N0, M0) invasive lobular carcinoma of the left breast. I explained to the patient and her son that her local treatment options include mastectomy with sentinel  lymph node biopsy and possibly post mastectomy radiation therapy based on her pathologic stage. Even if she is found to have a T3 primary, she may not necessarily require post mastectomy radiation. She is not interested in reconstruction at this time. We also discussed the possibility of neoadjuvant hormone therapy for downsizing with consideration of breast preservation. She could be monitored for response by ultrasound and that may take at least one year to have significant downsizing. I discussed the potential acute and late toxicities of radiation therapy. I told her that she would need to speak with Dr. Welton Flakes and also Dr. Jamey Ripa should she want to consider breast preservation with neoadjuvant hormone therapy.   PLAN: As above. I would be more than happy to see her for a followup visit following definitive surgery.   I spent 60 minutes minutes face to face with the patient and more than 50% of that time was spent in counseling and/or coordination of care.

## 2011-03-04 NOTE — Progress Notes (Signed)
Mailed after appt letter to pt. 

## 2011-03-05 ENCOUNTER — Encounter (INDEPENDENT_AMBULATORY_CARE_PROVIDER_SITE_OTHER): Payer: Self-pay | Admitting: Surgery

## 2011-03-05 NOTE — Progress Notes (Signed)
[name] [id number] [birth year] 71 y.o. 03/05/2011 11:06 PM  CC  NADEL,SCOTT Judie Petit, MD, MD [facility] [address] [place] [address] [place] [address] Dr. Cyndia Bent  REASON FOR CONSULTATION:    STAGE:   Breast cancer, ILC, left, receptor +, her 2 neg   Primary site: Breast (Left)   Staging method: AJCC 7th Edition   Clinical: Stage IIB (T3, N0, cM0) signed by Currie Paris, MD on 03/05/2011  7:32 AM   Summary: Stage IIB (T3, N0, cM0)  REFERRING PHYSICIAN: 71 year old female with new diagnosis of breast cancer stage IIB  ISTORY OF PRESENT ILLNESS: 71 year old female with multiple medical problems including diabetes anxiety hypercholesterolemia depression. Patient recently felt a mass that apparently have been present for several months. She was seen by her primary care provider and therefore she had a mammogram. She had not had a mammogram for about 4-6 years. The mammogram showed irregular high-density mass with irregular and spiculated margins in the lateral central aspect of the left breast in the middle third. She went onto have an ultrasound performed that showed irregular hypoechoic mass with ill-defined margins in the 4:00 position 5 cm from the nipple measuring 3.0 x 1.6 x 2.5 cm. Because of this she went on to have a needle core biopsy performed on 02/20/2011. The biopsy showed a invasive mammary carcinoma with lobular features grade 3 the tumor was estrogen receptor +100% progesterone receptor +100% proliferation marker 18% HER-2/neu  was negative. Patient was seen by Dr. Cyndia Bent who discussed surgical options. She is now seen in medical oncology for discussion of treatment and management. patient had an MRI of the breasts performed on general 25 2013. The mammogram revealed ill-defined and spiculated margin mass in the 3:00 to 4:00 position of the left breast measuring 3.7 x 2.8 x 5.5 cm   Patient is very anxious and very tearful throughout her  visit. She does have some tenderness in the breast mass. She also denies any fevers chills night sweats headaches shortness of breath chest pains palpitations she has no nausea or vomiting chills fevers night sweats.  Past Medical History: Past Medical History  Diagnosis Date  . DIABETES MELLITUS 08/10/2007  . 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 HYPERCHOLESTEROLEMIA 12/17/2006  . OBESITY 06/04/2008  . ANXIETY 06/04/2008  . DEPRESSION 08/10/2007  . HYPERTENSION 12/17/2006  . ASTHMATIC BRONCHITIS, ACUTE 06/04/2008  . ALLERGIC RHINITIS 12/13/2007  . GERD 12/17/2006  . DEGENERATIVE JOINT DISEASE 12/17/2006  . 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 Laminectomy [id number]    s/p-Dr. Jule Ser  . Bladder tack 1974  . Cholecystectomy 02/23/1991    [facility] - Dr Jamey Ripa  . Tonsillectomy   . Rotator cuff repair 8/03    right, Dr. Shelle Iron  . Enterocele repair 06/08    Dr. Estanislado Pandy  . Rotator cuff repair 10/26/2008    left, Dr. Rayburn Ma  . Hip surgery 08/27/2010    DR. Despina Hick  . Breast excisional biopsy 11/10/1988    left benign Dr Jamey Ripa  . Breast excisional biopsy 03/04/1983    Right - two areas - Dr Jamey Ripa  . Breast excisional biopsy 10/09/1980    right - Dr Jamey Ripa  . Breast excisional biopsy 10/18/1979    left - Dr Jamey Ripa  . Breast excisional biopsy 01/02/1994    right - Dr Jamey Ripa  . Vaginal hysterectomy 1974    partial  Family History: Family History  Problem Relation Age of Onset  . COPD Mother   . Arthritis Mother   . Emphysema Mother   . Mental illness Paternal Grandfather   . Heart disease Other     Sibling  . Diabetes Other     Sibling   . Diabetes Brother   . Cancer Brother     prostate  . Cancer Father     malignant brain tumor    Social History History  Substance Use Topics  . Smoking status: Never Smoker   . Smokeless tobacco: Never Used  . Alcohol Use: No    Allergies: Allergies  Allergen  Reactions  . Ceftriaxone Sodium     REACTION: hives  . Cephalexin     REACTION: hives  . Clindamycin     REACTION: hives  . Oxycodone-Acetaminophen     REACTION: hives  . Rocephin (Ceftriaxone Sodium In Dextrose) Hives  . Sulfonamide Derivatives     REACTION: hives    Current Medications: Current Outpatient Prescriptions  Medication Sig Dispense Refill  . albuterol (PROVENTIL) (2.5 MG/3ML) 0.083% nebulizer solution 2.5 mg every 4 (four) hours as needed. As needed for shortness of breath.      . ALPRAZolam (XANAX) 0.5 MG tablet 1/2-1 tablet three times a day as needed for nerves  90 tablet  5  . amLODipine (NORVASC) 10 MG tablet Take 1 tablet (10 mg total) by mouth daily.  30 tablet  11  . atorvastatin (LIPITOR) 40 MG tablet Take 1 tablet (40 mg total) by mouth daily.  30 tablet  11  . benazepril (LOTENSIN) 40 MG tablet Take 1 tablet (40 mg total) by mouth daily.  30 tablet  11  . Cholecalciferol (VITAMIN D) 2000 UNITS tablet Take 2,000 Units by mouth daily.        . hydrochlorothiazide 25 MG tablet 1/2 to 1 tablet by mouth once a day as needed for swelling       . metFORMIN (GLUCOPHAGE) 500 MG tablet Take 1 tablet (500 mg total) by mouth 2 (two) times daily with a meal.  60 tablet  5  . omeprazole (PRILOSEC) 20 MG capsule Take 1 capsule (20 mg total) by mouth daily.  30 capsule  11  . potassium chloride SA (K-DUR,KLOR-CON) 20 MEQ tablet Take 20 mEq by mouth daily as needed. (take with the HCTZ)       . sertraline (ZOLOFT) 50 MG tablet Take 1 tablet (50 mg total) by mouth daily.  30 tablet  11  . sitaGLIPtin (JANUVIA) 50 MG tablet Take 50 mg by mouth daily.      . naproxen (NAPROSYN) 250 MG tablet Take 250 mg by mouth daily as needed. As needed for bursitis pain in hip         OB/GYN History: patient is postmenopausal she is not on hormone replacement  Fertility Discussion: N/A Prior History of Cancer: non-  ECOG PERFORMANCE STATUS: 1 - Symptomatic but completely  ambulatory  Genetic Counseling/testing: patient has postmenopausal breast cancer there is no family history of breast cancers so genetic counseling and testing is at this time do not recommend  REVIEW OF SYSTEMS:  Constitutional: positive for Very tearful Eyes: negative Ears, nose, mouth, throat, and face: negative Respiratory: negative Cardiovascular: negative Gastrointestinal: negative Genitourinary:negative Integument/breast: positive for breast lump and breast tenderness Hematologic/lymphatic: negative Musculoskeletal:negative Neurological: negative  PHYSICAL EXAMINATION: Blood pressure 143/72, pulse 69, temperature 97.5 F (36.4 C), temperature source Oral, height 5\' 8"  (1.727 m), weight  198 lb (89.812 kg).  YNW:GNFAO, healthy and no distress SKIN: skin color, texture, turgor are normal HEAD: No masses, lesions, tenderness or abnormalities EYES: PERRLA, EOMI, Conjunctiva are pink and non-injected, sclera clear EARS: External ears normal OROPHARYNX:no exudate, no erythema and lips, buccal mucosa, and tongue normal  NECK: supple, no adenopathy, no bruits, no JVD, thyroid normal size, non-tender, without nodularity LYMPH:  no palpable lymphadenopathy, no hepatosplenomegaly BREAST: Right breast without any masses nipple discharge left breast reveals a palpable mass there is now areas of ecchymosis from her recent biopsy it is present in the lower outer quadrant. It is tender LUNGS: clear to auscultation and percussion HEART: regular rate & rhythm, no murmurs and no gallops ABDOMEN:abdomen soft, non-tender, obese, normal bowel sounds and no masses or organomegaly BACK: No CVA tenderness EXTREMITIES:no edema, no clubbing, no cyanosis  NEURO: alert & oriented x 3 with fluent speech, no focal motor/sensory deficits, gait normal, reflexes normal and symmetric  STUDIES/RESULTS: US Breast Left  15-Mar-2011  *RADIOLOGY REPORT*  Clinical Data:  Left breast mass  DIGITAL DIAGNOSTIC  BILATERAL MAMMOGRAM WITH CAD AND LEFT BREAST ULTRASOUND:  Comparison:  None.  Findings:  The breast tissue is heterogeneously dense.  No mass or malignant type calcifications are seen in the right breast.  An irregular, high density mass with irregular and spiculated margins is imaged in the lateral central aspect of the left breast, middle third Mammographic images were processed with CAD.  On physical exam, I palpate a hard, non mobile mass in the 4 o'clock position, 5 cm from the nipple.  Glandular tissue is palpated in the upper-outer quadrant of the left. Normal axillary contents are palpated.  Ultrasound is performed, showing an irregular, hypoechoic mass with ill-defined margins is imaged in the 4 o'clock position, 5 cm the nipple measuring 3.0 x 1.6 x 2.5 cm.  Normal axillary contents are imaged.  IMPRESSION: Mass, left breast which biopsy is done reported separately.  No mammographic evidence of malignancy, right breast.  BI-RADS CATEGORY 4:  Suspicious abnormality - biopsy should be considered.  Original Report Authenticated By: Hiram Gash, M.D.   Mr Breast Bilateral W Wo Contrast  03/02/2011  *RADIOLOGY REPORT*  Clinical Data: New diagnosis left sided breast cancer.  BILATERAL BREAST MRI WITH AND WITHOUT CONTRAST  Technique: Multiplanar, multisequence MR images of both breasts were obtained prior to and following the intravenous administration of 18ml of Multihance.  Three dimensional images were evaluated at the independent DynaCad workstation.  Comparison:  Mammogram dated 03/15/11  Findings: Foci of nonspecific enhancement seen bilaterally.  An irregular, enhancing mass with ill-defined and spiculated margins is seen in the three to four o'clock position of the left breast, spanning the middle third measuring 3.7 x 2.8 x 5.5 cm.  A biopsy clip is seen at the lateral margin of the mass.  No other mass or suspicious enhancement seen in either breast.  There is no axillary or internal mammary  adenopathy.  A 1.0 cm T2 bright lesion is seen in the left lobe of the liver, likely, a cyst or hemangioma.  IMPRESSION: Known malignancy, left breast.  No MRI specific evidence of malignancy, right breast.  No axillary or internal mammary adenopathy.  THREE-DIMENSIONAL MR IMAGE RENDERING ON INDEPENDENT WORKSTATION:  Three-dimensional MR images were rendered by post-processing of the original MR data on an independent workstation.  The three- dimensional MR images were interpreted, and findings were reported in the accompanying complete MRI report for this study.  BI-RADS CATEGORY  6:  Known biopsy-proven malignancy - appropriate action should be taken.  Original Report Authenticated By: Hiram Gash, M.D.   [place] Core Biopsy  02/20/2011  *RADIOLOGY REPORT*  Clinical Data:  Left breast mass  ULTRASOUND GUIDED CORE BIOPSY OF THE left BREAST  Comparison: None  I met with the patient and we discussed the procedure of ultrasound- guided biopsy, including benefits and alternatives.  We discussed the high likelihood of a successful procedure. We discussed the risks of the procedure, including infection, bleeding, tissue injury, clip migration, and inadequate sampling.  Informed written consent was given.  Using sterile technique 2% lidocaine, ultrasound guidance and a 14 gauge automated biopsy device, biopsy was performed of a left breast mass.  At the conclusion of the procedure a tissue marker clip was deployed into the biopsy cavity.  Follow up 2 view mammogram was performed and dictated separately.  IMPRESSION: Ultrasound guided biopsy of a left breast mass.  No apparent complications.  Original Report Authenticated By: Hiram Gash, M.D.   Mm Digital Diagnostic Bilat  02/20/2011  *RADIOLOGY REPORT*  Clinical Data:  Left breast mass  DIGITAL DIAGNOSTIC BILATERAL MAMMOGRAM WITH CAD AND LEFT BREAST ULTRASOUND:  Comparison:  None.  Findings:  The breast tissue is heterogeneously dense.  No mass or malignant  type calcifications are seen in the right breast.  An irregular, high density mass with irregular and spiculated margins is imaged in the lateral central aspect of the left breast, middle third Mammographic images were processed with CAD.  On physical exam, I palpate a hard, non mobile mass in the 4 o'clock position, 5 cm from the nipple.  Glandular tissue is palpated in the upper-outer quadrant of the left. Normal axillary contents are palpated.  Ultrasound is performed, showing an irregular, hypoechoic mass with ill-defined margins is imaged in the 4 o'clock position, 5 cm the nipple measuring 3.0 x 1.6 x 2.5 cm.  Normal axillary contents are imaged.  IMPRESSION: Mass, left breast which biopsy is done reported separately.  No mammographic evidence of malignancy, right breast.  BI-RADS CATEGORY 4:  Suspicious abnormality - biopsy should be considered.  Original Report Authenticated By: Hiram Gash, M.D.   Mm Digital Diagnostic Unilat L  02/25/2011  *RADIOLOGY REPORT*  Clinical Data:  Left breast mass  DIGITAL DIAGNOSTIC LEFT MAMMOGRAM  Comparison:  Same date  Findings:  Films are performed following ultrasound guided biopsy of a left breast mass.  Images show a coil type tissue marker in the lateral central aspect of the left breast, posteriorly in the area of palpable concern.  IMPRESSION: Adequate clip placement following ultrasound guided biopsy, left breast.  Original Report Authenticated By: Hiram Gash, M.D.   Mm Radiologist Eval And Mgmt  02/23/2011  **ADDENDUM** CREATED: 02/23/2011 15:12:41  These results were also discussed with the patients daughter in law, Dixon Boos by telephone at 3 pm.  **END ADDENDUM** SIGNED BY: Hiram Gash, M.D.    02/23/2011  *RADIOLOGY REPORT*  ESTABLISHED PATIENT OFFICE VISIT - LEVEL II [phone])  Chief Complaint:  Left breast mass.  History:  Left breast mass, follow-up for biopsy done Friday 02/20/2011.  Exam:  The biopsy site is clean and dry  without bruising.  Mild tenderness is noted to gentle palpation.  Assessment and Plan:  Biopsy results reveal invasive mammary carcinoma, grade III of III with some lobular features are noted; e- cadherin stains are pending.  This is concordant with imaging findings. This is discussed with the patient  and her questions were answered.  An appointment is scheduled with Dr. Jamey Ripa at 8:30 on 02/27/2011.  A breast MRI is scheduled for 02/27/2011 at 3:00 p.m.  Original Report Authenticated By: Hiram Gash, M.D.     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 The MRI revealed 5.5 cm mass. The final pathology reveals a invasive lobular carcinoma that is ER positive PR positive HER-2/neu negative grade 3 with proliferation marker 18%.  #2 patient is status post core needle biopsy. Patient has been seen by Dr. Cyndia Bent. A  lumpectomy versus mastectomy was discussed with the patient today as well as by Dr. Jamey Ripa. Certainly if patient was interested in a lumpectomy then she could be treated with neoadjuvant therapy. However the patient is very much interested in having her surgery done as soon as possible.  PLAN:    Recommendation:.  #1 patient will proceed with mastectomy as scheduled by Dr. Jamey Ripa. We will not plan on doing a Port-A-Cath at this time. Once I  have her final pathology we will send an Oncotype DX to see her recurrence score. I have discussed the rationale behind this.  #2 if the Oncotype DX score is low she will be treated with antiestrogen therapy only. However if it is an intermediate or high risk category we will treat her with adjuvant chemotherapy followed by antiestrogen therapy.  #3 I have recommended that patient be seen by radiation oncology and she is going to be set up to see Dr. Ballard Russell on 03/05/2011.  #4 I will plan on seeing the patient back in 3-4 weeks' time.       Thank you so much for allowing me to participate in the care of Randall An. I will continue to follow up the patient with you and assist in her care.  All questions were answered. The patient knows to call the clinic with any problems, questions or concerns. We can certainly see the patient much sooner if necessary.  I spent 40 minutes counseling the patient face to face. The total time spent in the appointment was 60 minutes.  Drue Second, MD Medical/Oncology [facility] [phone] (beeper) [phone] (Office)  03/05/2011, 11:06 PM 03/05/2011, 11:06 PM

## 2011-03-10 ENCOUNTER — Telehealth: Payer: Self-pay | Admitting: *Deleted

## 2011-03-10 NOTE — Telephone Encounter (Signed)
left voice message on home phone and cell phone on 03-10-2011 informing the patient of the new date and time on 02-25-213

## 2011-03-11 ENCOUNTER — Encounter (HOSPITAL_BASED_OUTPATIENT_CLINIC_OR_DEPARTMENT_OTHER): Payer: Self-pay | Admitting: *Deleted

## 2011-03-11 NOTE — Progress Notes (Signed)
To come in for labs Bring all meds, overnight bag

## 2011-03-13 ENCOUNTER — Telehealth: Payer: Self-pay | Admitting: Nutrition

## 2011-03-13 NOTE — Telephone Encounter (Signed)
Patient returned my call.  Patient reports her appetite has improved.  She is interested in information on a healthy diet.  Brief education given on plant based, low fat diet.  I have mailed information to patient and encouraged her to contact me with questions or concerns.

## 2011-03-13 NOTE — Telephone Encounter (Signed)
Patient was positive for malnutrition risk screen.  I called patient to see if I could be of assistance and left a message to return my call.

## 2011-03-17 ENCOUNTER — Ambulatory Visit: Payer: Medicare Other | Admitting: Oncology

## 2011-03-20 ENCOUNTER — Encounter (HOSPITAL_BASED_OUTPATIENT_CLINIC_OR_DEPARTMENT_OTHER)
Admission: RE | Admit: 2011-03-20 | Discharge: 2011-03-20 | Disposition: A | Discharge status: Home / Self Care | Payer: Medicare Other | Source: Ambulatory Visit | Attending: Surgery | Admitting: Surgery

## 2011-03-20 LAB — BASIC METABOLIC PANEL
BUN: 18 mg/dL (ref 6–23)
GFR calc non Af Amer: 88 mL/min — ABNORMAL LOW (ref >90)
Glucose, Bld: 317 mg/dL — ABNORMAL HIGH (ref 70–99)
Potassium: 4.5 mEq/L (ref 3.5–5.1)

## 2011-03-23 ENCOUNTER — Encounter (HOSPITAL_BASED_OUTPATIENT_CLINIC_OR_DEPARTMENT_OTHER): Payer: Self-pay | Admitting: Anesthesiology

## 2011-03-23 ENCOUNTER — Ambulatory Visit (HOSPITAL_COMMUNITY)
Admission: RE | Admit: 2011-03-23 | Discharge: 2011-03-23 | Disposition: A | Discharge status: Home / Self Care | Payer: Medicare Other | Source: Ambulatory Visit | Attending: Surgery | Admitting: Surgery

## 2011-03-23 ENCOUNTER — Ambulatory Visit (HOSPITAL_BASED_OUTPATIENT_CLINIC_OR_DEPARTMENT_OTHER)
Admission: RE | Admit: 2011-03-23 | Discharge: 2011-03-24 | Disposition: A | Discharge status: Home / Self Care | Payer: Medicare Other | Source: Ambulatory Visit | Attending: Surgery | Admitting: Surgery

## 2011-03-23 ENCOUNTER — Encounter (HOSPITAL_BASED_OUTPATIENT_CLINIC_OR_DEPARTMENT_OTHER): Payer: Self-pay

## 2011-03-23 ENCOUNTER — Encounter (HOSPITAL_BASED_OUTPATIENT_CLINIC_OR_DEPARTMENT_OTHER)
Admission: RE | Disposition: A | Discharge status: Home / Self Care | Payer: Self-pay | Source: Ambulatory Visit | Attending: Surgery

## 2011-03-23 ENCOUNTER — Other Ambulatory Visit (INDEPENDENT_AMBULATORY_CARE_PROVIDER_SITE_OTHER): Payer: Self-pay | Admitting: Surgery

## 2011-03-23 ENCOUNTER — Ambulatory Visit (HOSPITAL_BASED_OUTPATIENT_CLINIC_OR_DEPARTMENT_OTHER): Payer: Medicare Other | Admitting: Anesthesiology

## 2011-03-23 DIAGNOSIS — E7800 Pure hypercholesterolemia, unspecified: Secondary | ICD-10-CM | POA: Insufficient documentation

## 2011-03-23 DIAGNOSIS — C50919 Malignant neoplasm of unspecified site of unspecified female breast: Secondary | ICD-10-CM

## 2011-03-23 DIAGNOSIS — C50519 Malignant neoplasm of lower-outer quadrant of unspecified female breast: Secondary | ICD-10-CM | POA: Insufficient documentation

## 2011-03-23 DIAGNOSIS — C50319 Malignant neoplasm of lower-inner quadrant of unspecified female breast: Secondary | ICD-10-CM

## 2011-03-23 DIAGNOSIS — E119 Type 2 diabetes mellitus without complications: Secondary | ICD-10-CM | POA: Insufficient documentation

## 2011-03-23 DIAGNOSIS — I10 Essential (primary) hypertension: Secondary | ICD-10-CM | POA: Insufficient documentation

## 2011-03-23 DIAGNOSIS — Z01812 Encounter for preprocedural laboratory examination: Secondary | ICD-10-CM | POA: Insufficient documentation

## 2011-03-23 DIAGNOSIS — C773 Secondary and unspecified malignant neoplasm of axilla and upper limb lymph nodes: Secondary | ICD-10-CM | POA: Insufficient documentation

## 2011-03-23 HISTORY — DX: Encounter for other specified aftercare: Z51.89

## 2011-03-23 HISTORY — PX: MASTECTOMY: SHX3

## 2011-03-23 LAB — GLUCOSE, CAPILLARY
Glucose-Capillary: 199 mg/dL — ABNORMAL HIGH (ref 70–99)
Glucose-Capillary: 179 mg/dL — ABNORMAL HIGH (ref 70–99)

## 2011-03-23 SURGERY — MASTECTOMY, MODIFIED RADICAL
Anesthesia: General | Site: Breast | Laterality: Left | Wound class: Clean

## 2011-03-23 MED ORDER — FENTANYL CITRATE 0.05 MG/ML IJ SOLN
INTRAMUSCULAR | Status: DC | PRN
  Administered 2011-03-23: 25 ug via INTRAVENOUS
  Administered 2011-03-23: 100 ug via INTRAVENOUS
  Administered 2011-03-23: 25 ug via INTRAVENOUS

## 2011-03-23 MED ORDER — SODIUM CHLORIDE 0.9 % IJ SOLN
INTRAMUSCULAR | Status: DC | PRN
  Administered 2011-03-23: 10:00:00 via INTRAMUSCULAR

## 2011-03-23 MED ORDER — HYDROMORPHONE HCL PF 1 MG/ML IJ SOLN
0.2500 mg | INTRAMUSCULAR | Status: DC | PRN
  Administered 2011-03-23: 0.5 mg via INTRAVENOUS

## 2011-03-23 MED ORDER — METOCLOPRAMIDE HCL 5 MG/ML IJ SOLN
INTRAMUSCULAR | Status: DC | PRN
  Administered 2011-03-23 (×2): 10 mg via INTRAVENOUS

## 2011-03-23 MED ORDER — AMLODIPINE BESYLATE 10 MG PO TABS: 10.0000 mg | ORAL_TABLET | Freq: Every day | ORAL | Status: DC

## 2011-03-23 MED ORDER — ALBUTEROL SULFATE (2.5 MG/3ML) 0.083% IN NEBU: 2.5000 mg | INHALATION_SOLUTION | RESPIRATORY_TRACT | Status: DC | PRN

## 2011-03-23 MED ORDER — SERTRALINE HCL 50 MG PO TABS: 50.0000 mg | ORAL_TABLET | Freq: Every day | ORAL | Status: DC

## 2011-03-23 MED ORDER — CHLORHEXIDINE GLUCONATE 4 % EX LIQD: 1.0000 "application " | Freq: Once | CUTANEOUS | Status: DC

## 2011-03-23 MED ORDER — ALPRAZOLAM 0.5 MG PO TABS
0.5000 mg | ORAL_TABLET | Freq: Three times a day (TID) | ORAL | Status: DC | PRN
  Administered 2011-03-23: 0.5 mg via ORAL

## 2011-03-23 MED ORDER — LACTATED RINGERS IV SOLN
INTRAVENOUS | Status: DC
  Administered 2011-03-23 (×2): via INTRAVENOUS
  Administered 2011-03-23: 10 mL/h via INTRAVENOUS

## 2011-03-23 MED ORDER — MORPHINE SULFATE 2 MG/ML IJ SOLN: 0.0500 mg/kg | INTRAMUSCULAR | Status: DC | PRN

## 2011-03-23 MED ORDER — HYDROMORPHONE HCL PF 1 MG/ML IJ SOLN
2.0000 mg | INTRAMUSCULAR | Status: DC | PRN
  Administered 2011-03-23: 0.5 mg via INTRAVENOUS

## 2011-03-23 MED ORDER — FENTANYL CITRATE 0.05 MG/ML IJ SOLN
50.0000 ug | INTRAMUSCULAR | Status: DC | PRN
  Administered 2011-03-23: 50 ug via INTRAVENOUS

## 2011-03-23 MED ORDER — MIDAZOLAM HCL 2 MG/2ML IJ SOLN
0.5000 mg | INTRAMUSCULAR | Status: DC | PRN
  Administered 2011-03-23: 2 mg via INTRAVENOUS

## 2011-03-23 MED ORDER — LIDOCAINE HCL (CARDIAC) 20 MG/ML IV SOLN
INTRAVENOUS | Status: DC | PRN
  Administered 2011-03-23: 50 mg via INTRAVENOUS

## 2011-03-23 MED ORDER — HYDROMORPHONE HCL 2 MG PO TABS
2.0000 mg | ORAL_TABLET | ORAL | Status: DC | PRN
  Administered 2011-03-23 – 2011-03-24 (×3): 2 mg via ORAL

## 2011-03-23 MED ORDER — EPHEDRINE SULFATE 50 MG/ML IJ SOLN
INTRAMUSCULAR | Status: DC | PRN
  Administered 2011-03-23 (×4): 10 mg via INTRAVENOUS

## 2011-03-23 MED ORDER — ONDANSETRON HCL 4 MG/2ML IJ SOLN
INTRAMUSCULAR | Status: DC | PRN
  Administered 2011-03-23 (×2): 4 mg via INTRAVENOUS

## 2011-03-23 MED ORDER — METFORMIN HCL 500 MG PO TABS
500.0000 mg | ORAL_TABLET | Freq: Two times a day (BID) | ORAL | Status: DC
  Administered 2011-03-23: 500 mg via ORAL

## 2011-03-23 MED ORDER — METOCLOPRAMIDE HCL 5 MG/ML IJ SOLN: 10.0000 mg | Freq: Once | INTRAMUSCULAR | Status: AC | PRN

## 2011-03-23 MED ORDER — CIPROFLOXACIN IN D5W 400 MG/200ML IV SOLN
400.0000 mg | Freq: Two times a day (BID) | INTRAVENOUS | Status: AC
  Administered 2011-03-23: 400 mg via INTRAVENOUS

## 2011-03-23 MED ORDER — SODIUM CHLORIDE 0.9 % IV SOLN
INTRAVENOUS | Status: DC
  Administered 2011-03-23: 16:00:00 via INTRAVENOUS

## 2011-03-23 MED ORDER — CIPROFLOXACIN IN D5W 400 MG/200ML IV SOLN
400.0000 mg | INTRAVENOUS | Status: AC
  Administered 2011-03-23: 400 mg via INTRAVENOUS

## 2011-03-23 MED ORDER — PROPOFOL 10 MG/ML IV EMUL
INTRAVENOUS | Status: DC | PRN
  Administered 2011-03-23: 250 mg via INTRAVENOUS

## 2011-03-23 MED ORDER — ACETAMINOPHEN 10 MG/ML IV SOLN
1000.0000 mg | Freq: Once | INTRAVENOUS | Status: AC
  Administered 2011-03-23: 1000 mg via INTRAVENOUS

## 2011-03-23 MED ORDER — KCL-LACTATED RINGERS-D5W 20 MEQ/L IV SOLN: INTRAVENOUS | Status: DC

## 2011-03-23 MED ORDER — BENAZEPRIL HCL 40 MG PO TABS: 40.0000 mg | ORAL_TABLET | Freq: Every day | ORAL | Status: DC

## 2011-03-23 MED ORDER — PANTOPRAZOLE SODIUM 40 MG PO TBEC: 40.0000 mg | DELAYED_RELEASE_TABLET | Freq: Every day | ORAL | Status: DC

## 2011-03-23 MED ORDER — TECHNETIUM TC 99M SULFUR COLLOID FILTERED
1.0000 | Freq: Once | INTRAVENOUS | Status: AC | PRN
  Administered 2011-03-23: 1 via INTRADERMAL

## 2011-03-23 SURGICAL SUPPLY — 65 items
ADH SKN CLS APL DERMABOND .7 (GAUZE/BANDAGES/DRESSINGS) ×4
APL SKNCLS STERI-STRIP NONHPOA (GAUZE/BANDAGES/DRESSINGS) ×2
APPLIER CLIP 11 MED OPEN (CLIP)
APPLIER CLIP 9.375 MED OPEN (MISCELLANEOUS) ×6
APR CLP MED 11 20 MLT OPN (CLIP)
APR CLP MED 9.3 20 MLT OPN (MISCELLANEOUS) ×4
BANDAGE ELASTIC 6 VELCRO ST LF (GAUZE/BANDAGES/DRESSINGS) ×3 IMPLANT
BENZOIN TINCTURE PRP APPL 2/3 (GAUZE/BANDAGES/DRESSINGS) ×3 IMPLANT
BINDER BREAST XLRG (GAUZE/BANDAGES/DRESSINGS) ×2 IMPLANT
BLADE HEX COATED 2.75 (ELECTRODE) ×3 IMPLANT
BLADE SURG 10 STRL SS (BLADE) ×3 IMPLANT
BLADE SURG 15 STRL LF DISP TIS (BLADE) ×2 IMPLANT
BLADE SURG 15 STRL SS (BLADE) ×3
BLADE SURG ROTATE 9660 (MISCELLANEOUS) IMPLANT
CANISTER SUCTION 1200CC (MISCELLANEOUS) ×3 IMPLANT
CHLORAPREP W/TINT 26ML (MISCELLANEOUS) ×3 IMPLANT
CLIP APPLIE 11 MED OPEN (CLIP) IMPLANT
CLIP APPLIE 9.375 MED OPEN (MISCELLANEOUS) ×2 IMPLANT
CLOTH BEACON ORANGE TIMEOUT ST (SAFETY) ×3 IMPLANT
COVER MAYO STAND STRL (DRAPES) ×3 IMPLANT
COVER PROBE W GEL 5X96 (DRAPES) ×3 IMPLANT
COVER TABLE BACK 60X90 (DRAPES) ×3 IMPLANT
DECANTER SPIKE VIAL GLASS SM (MISCELLANEOUS) IMPLANT
DERMABOND ADVANCED (GAUZE/BANDAGES/DRESSINGS) ×2
DERMABOND ADVANCED .7 DNX12 (GAUZE/BANDAGES/DRESSINGS) ×4 IMPLANT
DRAIN CHANNEL 19F RND (DRAIN) ×6 IMPLANT
DRAIN HEMOVAC 1/8 X 5 (WOUND CARE) IMPLANT
DRAPE LAPAROSCOPIC ABDOMINAL (DRAPES) ×2 IMPLANT
DRAPE U-SHAPE 76X120 STRL (DRAPES) IMPLANT
DRAPE UTILITY XL STRL (DRAPES) ×3 IMPLANT
DRSG PAD ABDOMINAL 8X10 ST (GAUZE/BANDAGES/DRESSINGS) ×2 IMPLANT
ELECT BLADE 4.0 EZ CLEAN MEGAD (MISCELLANEOUS) ×3
ELECT REM PT RETURN 9FT ADLT (ELECTROSURGICAL) ×3
ELECTRODE BLDE 4.0 EZ CLN MEGD (MISCELLANEOUS) ×2 IMPLANT
ELECTRODE REM PT RTRN 9FT ADLT (ELECTROSURGICAL) ×2 IMPLANT
EVACUATOR SILICONE 100CC (DRAIN) ×5 IMPLANT
GLOVE BIO SURGEON STRL SZ 6.5 (GLOVE) ×2 IMPLANT
GLOVE EUDERMIC 7 POWDERFREE (GLOVE) ×3 IMPLANT
GLOVE INDICATOR 7.0 STRL GRN (GLOVE) ×2 IMPLANT
GOWN PREVENTION PLUS XLARGE (GOWN DISPOSABLE) ×6 IMPLANT
NDL SAFETY ECLIPSE 18X1.5 (NEEDLE) ×2 IMPLANT
NEEDLE HYPO 18GX1.5 SHARP (NEEDLE) ×3
NEEDLE HYPO 25X1 1.5 SAFETY (NEEDLE) ×6 IMPLANT
NS IRRIG 1000ML POUR BTL (IV SOLUTION) ×3 IMPLANT
PACK BASIN DAY SURGERY FS (CUSTOM PROCEDURE TRAY) ×3 IMPLANT
PEN SKIN MARKING BROAD TIP (MISCELLANEOUS) ×3 IMPLANT
PENCIL BUTTON HOLSTER BLD 10FT (ELECTRODE) ×3 IMPLANT
PIN SAFETY STERILE (MISCELLANEOUS) ×3 IMPLANT
SHEET MEDIUM DRAPE 40X70 STRL (DRAPES) ×2 IMPLANT
SLEEVE SCD COMPRESS KNEE MED (MISCELLANEOUS) ×3 IMPLANT
SPONGE GAUZE 4X4 12PLY (GAUZE/BANDAGES/DRESSINGS) ×3 IMPLANT
SPONGE LAP 18X18 X RAY DECT (DISPOSABLE) ×3 IMPLANT
SPONGE LAP 4X18 X RAY DECT (DISPOSABLE) ×2 IMPLANT
STAPLER VISISTAT 35W (STAPLE) ×3 IMPLANT
STRIP CLOSURE SKIN 1/2X4 (GAUZE/BANDAGES/DRESSINGS) ×3 IMPLANT
SUT ETHILON 2 0 FS 18 (SUTURE) ×4 IMPLANT
SUT MNCRL AB 4-0 PS2 18 (SUTURE) ×2 IMPLANT
SUT SILK 2 0 SH (SUTURE) IMPLANT
SUT VICRYL 3-0 CR8 SH (SUTURE) ×6 IMPLANT
SYR CONTROL 10ML LL (SYRINGE) ×6 IMPLANT
TOWEL OR 17X24 6PK STRL BLUE (TOWEL DISPOSABLE) ×6 IMPLANT
TOWEL OR NON WOVEN STRL DISP B (DISPOSABLE) ×3 IMPLANT
TUBE CONNECTING 20X1/4 (TUBING) ×3 IMPLANT
WATER STERILE IRR 1000ML POUR (IV SOLUTION) ×1 IMPLANT
YANKAUER SUCT BULB TIP NO VENT (SUCTIONS) ×3 IMPLANT

## 2011-03-23 NOTE — H&P (Signed)
NAME: [name]                                                                                      DOB: 05/22/1940 DATE: 03/04/2011               MRN: [id number]   CC: Breast Cancer  HPI:  [name] is a 70 y.o.  female who was  Recently diagnosed with a left breast cancer lower outer quadrant, invasive lobular. This presented as a palpable mass. She had not had a mammogram for several years. After biopsy confirmed the diagnosis she was seen by medical oncology and elected to proceed to a mastectomy and sentinel node evaluation. She is admitted for that.  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  PSH:   has past surgical history that includes Laminectomy (4993); bladder tack (1974); Cholecystectomy (02/23/1991); Tonsillectomy; Rotator cuff repair (8/03); Enterocele repair (06/08); Rotator cuff repair (10/26/2008); Hip surgery (08/27/2010); Breast excisional biopsy (11/10/1988); Breast excisional biopsy (03/04/1983); Breast excisional biopsy (10/09/1980); Breast excisional biopsy (10/18/1979); Breast excisional biopsy (01/02/1994); Vaginal hysterectomy (1974); and Colonoscopy.  ALLERGIES:   Allergies  Allergen Reactions  . Ceftriaxone Sodium     REACTION: hives  . Cephalexin     REACTION: hives  . Clindamycin     REACTION: hives  . Oxycodone-Acetaminophen     REACTION: hives  . Rocephin (Ceftriaxone Sodium In Dextrose) Hives  . Sulfonamide Derivatives     REACTION: hives    MEDICATIONS: Current facility-administered medications:acetaminophen (OFIRMEV) IV 1,000 mg, 1,000 mg,  Intravenous, Once, Charles Eugene Frederick, MD, 1,000 mg at 03/23/11 0928;  chlorhexidine (HIBICLENS) 4 % liquid 1 application, 1 application, Topical, Once, Inell Mimbs J Khyler Eschmann, MD;  chlorhexidine (HIBICLENS) 4 % liquid 1 application, 1 application, Topical, Once, Sallee Hogrefe J Lynesha Bango, MD ciprofloxacin (CIPRO) IVPB 400 mg, 400 mg, Intravenous, 120 min pre-op, Girard Koontz J Margit Batte, MD;  fentaNYL (SUBLIMAZE) injection 50 mcg, 50 mcg, Intravenous, PRN, Charles Eugene Frederick, MD, 50 mcg at 03/23/11 0921;  lactated ringers infusion, , Intravenous, Continuous, W. Edmond Fitzgerald, MD, Last Rate: 20 mL/hr at 03/23/11 0909 midazolam (VERSED) injection 0.5-2 mg, 0.5-2 mg, Intravenous, PRN, Charles Eugene Frederick, MD, 2 mg at 03/23/11 0922  ROS: No change from office notes of one month ago  EXAM:   GENERAL:  The patient is alert, oriented, and generally healthy-appearing, NAD. Mood and affect are normal.  HEENT:  The head is normocephalic, the eyes nonicteric, the pupils were round regular and equal. EOMs are normal. Pharynx normal. Dentition good.  NECK:  The neck is supple and there are no masses or thyromegaly.  LUNGS: Normal respirations and clear to auscultation.  HEART: Regular rhythm, with no murmurs rubs or gallops. Pulses are intact carotid dorsalis pedis and posterior tibial. No significant varicosities are noted.  BREASTS:  The right breast is normal. There is a hard mass in the left breast   measuring 4-5 cm with some skin dimpling. This is in the lower outer quadrant. This is consistent with a carcinoma.  LYMPHATICS: There is no palpable axillary or suprapubic adenopathy on either side.  ABDOMEN: Soft, flat, and nontender. No masses or organomegaly is noted. No hernias are noted. Bowel sounds are normal.  EXTREMITIES:  Good range of motion, no edema.   DATA REVIEWED:  I have reviewed Her x-rays pathology report and consultation with the oncologist.  IMPRESSION:  Clinical  stage II left breast cancer lower outer quadrant  PLAN:   Left mastectomy with sentinel node evaluation. I have discussed situation with the patient and her family, reviewed options et cetera. All questions have been answered.  Jaiah Weigel J 03/23/2011  CC: No ref. provider found, NADEL,SCOTT M, MD, MD        

## 2011-03-23 NOTE — Op Note (Signed)
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 We reviewed the risks and complications a final time. She had no further questions. I marked theleft side as the operative side.  Description of Procedure: The patient was taken to the operating room. After satisfactory general anesthesia was obtained the timeout was done.   I then injected 5 cc of dilute methylene blue and injected it subareaorly and massaged it in. A full prep and drape was then done.  I outlined an elliptical incision and marked the inframammary fold and midline. The incision was made. The usual skin flaps were raised. I went to the clavicle superiorly and sternum medially and towards the latissimus laterally.    After the superior flap was complete I was able to use the neoprobe to locate a sentinel node.  I was able to find the 3 nodes and clinically I thought they were involved with metastatic disease.  Once the nodes were removed they were forwarded to pathology.   The inferior flap was then made going to the inframammary fold and out to the latissimus. The breast was then removed from the pectoralis starting medially and working laterally. When I got to the lateral aspect of the pectoralis major muscle I opened the clavipectoral fascia. I finished removing the breast from the serratus muscles.  At this point the pathologist reported on the sentinel node and that it was positive  I therefore did an axillary node dissection. I opened the clavipectoral fascia. I  divided the attachments to the chest wall using cautery. I found the axillary vein and swept the axillary contents from medial to lateral and superior to inferior.both the long thoracic and thoracodorsal nerves were preserved. The second intercostal nerve was divided. The motor supply and vessels to the pectoralis major muscle were preserved. The specimen was labeled for pathology once everything was disconnected and able to be handed off.  I spent several minutes irrigating and making sure everything was dry. I then placed a 11 [place] Blake drain through a small incision in the inferior flap and laid along the pectoralis muscle. It was secured with a 2-0 nylon suture.a second 50 [place] Blake drain was placed and left in the axilla.  Another irrigation and check for hemostasis was made. Everything appeared to be dry. Incision was then closed with interrupted 3-0 Vicryl followed by running 4-0 Monocryl subcuticular and Dermabond. Steri-Strips were also applied.  The patient tolerated the procedure well. There were no operative complications. All counts were correct. Estimated blood loss was100 cc. Sterile dressings were applied and the patient taken to the PACU in satisfactory condition.  Currie Paris, MD, FACS 03/23/2011 12:08 PM

## 2011-03-23 NOTE — Anesthesia Postprocedure Evaluation (Signed)
  Anesthesia Post-op Note  Patient: [name]  Procedure(s) Performed: Procedure(s) (LRB): MASTECTOMY MODIFIED RADICAL (Left)  Patient Location: PACU  Anesthesia Type: General  Level of Consciousness: awake, alert  and oriented  Airway and Oxygen Therapy: Patient Spontanous Breathing and Patient connected to face mask oxygen  Post-op Pain: mild  Post-op Assessment: Post-op Vital signs reviewed, Patient's Cardiovascular Status Stable, Respiratory Function Stable, Patent Airway, No signs of Nausea or vomiting and Pain level controlled  Post-op Vital Signs: Reviewed and stable  Complications: No apparent anesthesia complications

## 2011-03-23 NOTE — Progress Notes (Signed)
Notified Dr. Ivin Booty of CBG 199 - no orders received

## 2011-03-23 NOTE — Anesthesia Procedure Notes (Signed)
Procedure Name: LMA Insertion Performed by: Camille Thau Pre-anesthesia Checklist: Patient identified, Timeout performed, Emergency Drugs available, Suction available and Patient being monitored Patient Re-evaluated:Patient Re-evaluated prior to inductionOxygen Delivery Method: Circle System Utilized Preoxygenation: Pre-oxygenation with 100% oxygen Intubation Type: IV induction Ventilation: Mask ventilation without difficulty LMA: LMA with gastric port inserted LMA Size: 4.0 Number of attempts: 1 Placement Confirmation: breath sounds checked- equal and bilateral and positive ETCO2 Tube secured with: Tape Dental Injury: Teeth and Oropharynx as per pre-operative assessment      

## 2011-03-23 NOTE — Anesthesia Preprocedure Evaluation (Signed)
Anesthesia Evaluation  Patient identified by MRN, date of birth, ID band Patient awake    Reviewed: Allergy & Precautions, H&P , NPO status , Patient's Chart, lab work & pertinent test results, reviewed documented beta blocker date and time   Airway Mallampati: II TM Distance: >3 FB Neck ROM: full    Dental   Pulmonary asthma ,          Cardiovascular hypertension, On Medications     Neuro/Psych PSYCHIATRIC DISORDERS Negative Neurological ROS     GI/Hepatic negative GI ROS, Neg liver ROS, GERD-  Medicated and Controlled,  Endo/Other  Diabetes mellitus-, Oral Hypoglycemic Agents  Renal/GU negative Renal ROS  Genitourinary negative   Musculoskeletal   Abdominal   Peds  Hematology negative hematology ROS (+)   Anesthesia Other Findings See surgeon's H&P   Reproductive/Obstetrics negative OB ROS                           Anesthesia Physical Anesthesia Plan  ASA: III  Anesthesia Plan: General   Post-op Pain Management:    Induction: Intravenous  Airway Management Planned: LMA  Additional Equipment:   Intra-op Plan:   Post-operative Plan: Extubation in OR  Informed Consent: I have reviewed the patients History and Physical, chart, labs and discussed the procedure including the risks, benefits and alternatives for the proposed anesthesia with the patient or authorized representative who has indicated his/her understanding and acceptance.     Plan Discussed with: CRNA and Surgeon  Anesthesia Plan Comments:         Anesthesia Quick Evaluation

## 2011-03-23 NOTE — Transfer of Care (Signed)
Immediate Anesthesia Transfer of Care Note  Patient: [name]  Procedure(s) Performed: Procedure(s) (LRB): MASTECTOMY MODIFIED RADICAL (Left)  Patient Location: PACU  Anesthesia Type: General  Level of Consciousness: awake  Airway & Oxygen Therapy: Patient Spontanous Breathing and Patient connected to face mask oxygen  Post-op Assessment: Report given to PACU RN and Post -op Vital signs reviewed and stable  Post vital signs: Reviewed and stable  Complications: No apparent anesthesia complications

## 2011-03-24 ENCOUNTER — Encounter: Payer: Self-pay | Admitting: *Deleted

## 2011-03-24 ENCOUNTER — Telehealth: Payer: Self-pay | Admitting: Pulmonary Disease

## 2011-03-24 MED ORDER — BUDESONIDE-FORMOTEROL FUMARATE 80-4.5 MCG/ACT IN AERO: 2.0000 | INHALATION_SPRAY | Freq: Two times a day (BID) | RESPIRATORY_TRACT | Status: DC

## 2011-03-24 MED ORDER — HYDROMORPHONE HCL 2 MG PO TABS: 2.0000 mg | ORAL_TABLET | ORAL | Status: DC | PRN

## 2011-03-24 NOTE — Progress Notes (Signed)
1 Day Post-Op  Subjective: Sore but good pain relief with po Dilaudid  Objective: Vital signs in last 24 hours: Temp:  [97.2 F (36.2 C)-98.5 F (36.9 C)] 98.5 F (36.9 C) (02/19 0600) Pulse Rate:  [64-93] 86  (02/19 0600) Resp:  [12-20] 18  (02/19 0600) BP: (104-144)/(47-71) 144/60 mmHg (02/19 0600) SpO2:  [90 %-100 %] 95 % (02/19 0600)   Intake/Output from previous day: 02/18 0701 - 02/19 0700 In: 3121.5 [P.O.:1094; I.V.:2000] Out: 750 [Urine:650] Intake/Output this shift:     General appearance: alert and no distress Resp: clear to auscultation bilaterally  Incision: Dressing dry, drains thin  Lab Results:   Basename 03/23/11 0917  WBC --  HGB 13.5  HCT --  PLT --   BMET No results found for this basename: NA:2,K:2,CL:2,CO2:2,GLUCOSE:2,BUN:2,CREATININE:2,CALCIUM:2 in the last 72 hours PT/INR No results found for this basename: LABPROT:2,INR:2 in the last 72 hours ABG No results found for this basename: PHART:2,PCO2:2,PO2:2,HCO3:2 in the last 72 hours  MEDS, Scheduled    . acetaminophen  1,000 mg Intravenous Once  . amLODipine  10 mg Oral Daily  . benazepril  40 mg Oral Daily  . ciprofloxacin  400 mg Intravenous 120 min pre-op  . ciprofloxacin  400 mg Intravenous Q12H  . metFORMIN  500 mg Oral BID WC  . pantoprazole  40 mg Oral Q1200  . sertraline  50 mg Oral Daily  . DISCONTD: chlorhexidine  1 application Topical Once  . DISCONTD: chlorhexidine  1 application Topical Once    Studies/Results: Nm Sentinel Node Inj-no Rpt (breast)  03/23/2011  CLINICAL DATA: breast cancer left   Sulfur colloid was injected intradermally by the nuclear medicine  technologist for breast cancer sentinel node localization.      Assessment: s/p Procedure(s): MASTECTOMY MODIFIED RADICAL Doing well and able to go. Husband knows drain management  Plan: Discharge Discussed + SLN with patient   LOS: 1 day     Currie Paris, MD, [facility],  [place] [phone]   03/24/2011 7:16 AM

## 2011-03-24 NOTE — Telephone Encounter (Signed)
Called, spoke with pt's son, [name]. I informed him SN recs pt start symbicort 80 2 puffs bid and make sure pt rinses her mouth out well.  Also, advised she can use the albuterol nebs prn.  [name] verbalized understanding of these instructions and aware symbicort rx sent to [facility] [address]  Nothing further needed at this time.

## 2011-03-24 NOTE — Telephone Encounter (Signed)
Called, spoke with pt's son, [name].  States pt had a left mastectomy and came home today.  She is wheezing a lot today.  Using albuterol neb with relief.  Also, using incentive spirometer q2h.  Per [name], pt does not having increased SOB, chest tightness , or coughing.  Requesting further recs -- ? Does she need an inhaler ? Dr. Kriste Basque, pls advise.  Thanks   [facility] [address] Allergies verified Allergies  Allergen Reactions  . Ceftriaxone Sodium     REACTION: hives  . Cephalexin     REACTION: hives  . Clindamycin     REACTION: hives  . Oxycodone-Acetaminophen     REACTION: hives  . Rocephin (Ceftriaxone Sodium In Dextrose) Hives  . Sulfonamide Derivatives     REACTION: hives

## 2011-03-24 NOTE — Discharge Instructions (Signed)
Mastectomy  Care After HOME CARE    Care for your wound after the bandages are off as told by your doctor.   Put soft padding such as gauze, soft cloth, or a nursing pad over your wound if you wear a bra.   Ask your doctor about groups that can help you with any emotions you may have after the surgery.   Exercise your arm and shoulder as told by your doctor.   Place your hands on a wall. Use your fingers to "climb the wall." Reach as high as you can until you feel a stretch. When you are not exercising, keep your arm raised (elevated).   When sitting or lying down, put your arm up on pillows or rolled blankets.   Do not use your arm to lift or push anything heavier than 10 pounds (about one gallon of milk ) for the first 6 weeks.   Always take good care of the arm on the side that the breast was removed.   Never let anyone take your blood pressure, draw blood, or give you a shot in that arm.   Do not get even a small cut on that arm or hand. Use a thimble when you sew. Wear heavy gloves when you garden.   Use insect repellent on that arm if outside.   Do not use a razor to shave that underarm. You should use only an electric shaver.   Do not burn that arm. Use a glove when you reach into the oven. Cover your arm with a towel or wear a long-sleeved shirt when you are out in the sun.   Wear your watch and other jewelry on the other arm.   Wear a loose fitting rubber glove when you wash the dishes. Do not leave your hand in water for a long time, especially when you use detergents.   Do not cut your cuticles or hang nails. Push cuticles back with a towel after you take a bath.   Carry your purse or any heavy objects in the other arm.  GET HELP RIGHT AWAY IF:   Your arm becomes very puffy (swollen).   You have redness or pain at the wound site.   There is a bad smell coming from the wound.   Thre is yellowish white fluid (pus) coming from your wound.   You have a fever.    MAKE SURE YOU:   Understand these instructions.   Will watch your condition.   Will get help right away if you are not doing well or get worse.    About my Jackson-Pratt Bulb Drain  What is a Jackson-Pratt bulb? A Jackson-Pratt is a soft, round device used to collect drainage. It is connected to a long, thin drainage catheter, which is held in place by one or two small stiches near your surgical incision site. When the bulb is squeezed, it forms a vacuum, forcing the drainage to empty into the bulb.  Emptying the Jackson-Pratt bulb- To empty the bulb: 1. Release the plug on the top of the bulb. 2. Pour the bulb's contents into a measuring container which your nurse will provide. 3. Record the time emptied and amount of drainage. Empty the drain(s) as often as your     doctor or nurse recommends.  Date                  Time  Amount (Drain 1)                 Amount (Drain 2)  _____________________________________________________________________  _____________________________________________________________________  _____________________________________________________________________  _____________________________________________________________________  _____________________________________________________________________  _____________________________________________________________________  _____________________________________________________________________  _____________________________________________________________________  Squeezing the Jackson-Pratt Bulb- To squeeze the bulb: 1. Make sure the plug at the top of the bulb is open. 2. Squeeze the bulb tightly in your fist. You will hear air squeezing from the bulb. 3. Replace the plug while the bulb is squeezed. 4. Use a safety pin to attach the bulb to your clothing. This will keep the catheter from pulling at the bulb insertion site. When to call your doctor- Call your doctor if: Drain site  becomes red, swollen or hot. You have a fever greater than 101 degrees F. There is oozing at the drain site. Drain falls out (apply a guaze bandage over the drain hole and secure it with tape). Drainage increases daily not related to activity patterns. (You will usually have more drainage when you are active than when you are resting.) Drainage has a bad odor.

## 2011-03-24 NOTE — Telephone Encounter (Signed)
Per SN: he recs pt to start symbicort 80 2 puffs BID and make sure she rinses mouth out well and she can use the albuterol nebs PRN

## 2011-03-25 ENCOUNTER — Telehealth (INDEPENDENT_AMBULATORY_CARE_PROVIDER_SITE_OTHER): Payer: Self-pay

## 2011-03-25 ENCOUNTER — Telehealth (INDEPENDENT_AMBULATORY_CARE_PROVIDER_SITE_OTHER): Payer: Self-pay | Admitting: Surgery

## 2011-03-25 NOTE — Telephone Encounter (Signed)
Discussed her pathology report with her today. She has appointment with Dr Welton Flakes on Monday and me on Wednesday next.

## 2011-03-25 NOTE — Telephone Encounter (Signed)
Patient called in needing post op appointment. Discharge instructions said one week. Streck not here at one week so I made it for 2 days later on 2/27 @ 1:40. I explained one week was an estimation of time for follow up.  Patient wants [facility] nurse to call her back about her being seen on Monday not Wednesday since that is what her instructions say.

## 2011-03-25 NOTE — Telephone Encounter (Signed)
Spoke to patient. Made her aware Wednesday would be fine. Appt made. Patient states she did run a fever of 101 last night and was wheezing. States she called the on call doctor and was told to use nebulizer. Inhaler was also called in for her but she did not pick this up do to cost. She is better now. Breathing better. Moving around more. No fevers. I told patient to keep an eye on this and call if any more fevers or any other symptoms.

## 2011-03-26 ENCOUNTER — Telehealth (INDEPENDENT_AMBULATORY_CARE_PROVIDER_SITE_OTHER): Payer: Self-pay

## 2011-03-26 NOTE — Telephone Encounter (Signed)
The patient's son called in stating the Dilaudid is making her itch.  She can't take Percocet.  I called in Hydrocodone 5/325 one tablet po q4-6prn pain #30 no refills, generic allowed to [facility] [phone].  Pt aware.  She won't take Dilaudid anymore and has been taking Benadryl.

## 2011-03-30 ENCOUNTER — Encounter: Payer: Self-pay | Admitting: Oncology

## 2011-03-30 ENCOUNTER — Ambulatory Visit (HOSPITAL_BASED_OUTPATIENT_CLINIC_OR_DEPARTMENT_OTHER): Payer: Medicare Other | Admitting: Oncology

## 2011-03-30 VITALS — BP 157/82 | HR 70 | Temp 97.5°F | Ht 68.0 in | Wt 197.5 lb

## 2011-03-30 DIAGNOSIS — C50319 Malignant neoplasm of lower-inner quadrant of unspecified female breast: Secondary | ICD-10-CM

## 2011-03-30 HISTORY — PX: MODIFIED RADICAL MASTECTOMY W/ AXILLARY LYMPH NODE DISSECTION: SHX2042

## 2011-03-30 MED ORDER — ONDANSETRON HCL 8 MG PO TABS: ORAL_TABLET | ORAL | Status: DC

## 2011-03-30 MED ORDER — PROCHLORPERAZINE MALEATE 10 MG PO TABS: 10.0000 mg | ORAL_TABLET | Freq: Four times a day (QID) | ORAL | Status: DC | PRN

## 2011-03-30 MED ORDER — LORAZEPAM 0.5 MG PO TABS: 0.5000 mg | ORAL_TABLET | Freq: Four times a day (QID) | ORAL | Status: DC | PRN

## 2011-03-30 MED ORDER — LIDOCAINE-PRILOCAINE 2.5-2.5 % EX CREA: TOPICAL_CREAM | CUTANEOUS | Status: DC | PRN

## 2011-03-30 MED ORDER — PROCHLORPERAZINE 25 MG RE SUPP: 25.0000 mg | Freq: Two times a day (BID) | RECTAL | Status: DC | PRN

## 2011-03-30 MED ORDER — DEXAMETHASONE 4 MG PO TABS: ORAL_TABLET | ORAL | Status: DC

## 2011-03-30 NOTE — Progress Notes (Signed)
OFFICE PROGRESS NOTE  CC  Michele Mcalpine, MD, MD [facility] [address] [place] [address] [place] [address] Dr. Calton Dach Dr. Chipper Herb Dr. Romero Belling  DIAGNOSIS: 71 year old female with stage III a invasive lobular carcinoma of the left breast status post mastectomy with left axillary lymph node dissection performed on 03/23/2011.  PRIOR THERAPY:  #1 patient was originally seen by me on 03/05/2011 for a invasive lobular carcinoma of the left breast the tumor was 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 Her clinical staging was stage IIB (T3, N0, M0).  #2 the patient has gone on to have a left breast mastectomy with axillary lymph node dissection on 03/23/2011. The final pathology revealed a 6.0 cm invasive lobular carcinoma with lymphovascular invasion and perineural invasion. Invasive tumor was 2.9 cm from the nearest margin lobular carcinoma in situ was noted. Initially 6 lymph nodes were positive for metastatic carcinoma. Tumor deposits were 0.5 0.8 0.7 0.7 0.3 and 1.0 cm focal extracapsular tumor extension was noted. Tumor was grade 3. The final lymph node count was 16 9 of which were positive for metastatic disease. Tumor was estrogen receptor +100% progesterone receptor +100% HER-2/neu negative with a ratio 1.31 proliferation marker 18% final pathologic staging was p T3a pN2a, Stage IIIa   CURRENT THERAPY:Patient is seen today for discussion of adjuvant treatment.  INTERVAL HISTORY: [name] 71 y.o. female returns for Followup visit post mastectomy she still has her JP drains in. Clinically she seems to be doing okay but she is still complaining of some pain in her left breast. Patient otherwise denies any fevers chills night sweats headaches shortness of breath chest pains or palpitations. She does have considerable amount of anxiety. Her son and daughter and husband accompany her today. She still has significant  drainage in the JP drain. She is denying any peripheral paresthesias. Remainder of the template review of systems is negative.  MEDICAL HISTORY: Past Medical History  Diagnosis Date  . DIABETES MELLITUS 08/10/2007  . 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 HYPERCHOLESTEROLEMIA 12/17/2006  . OBESITY 06/04/2008  . ANXIETY 06/04/2008  . DEPRESSION 08/10/2007  . HYPERTENSION 12/17/2006  . ASTHMATIC BRONCHITIS, ACUTE 06/04/2008  . ALLERGIC RHINITIS 12/13/2007  . GERD 12/17/2006  . DEGENERATIVE JOINT DISEASE 12/17/2006  . 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 Asthma   . Blood transfusion 1964    post childbirth    ALLERGIES:  is allergic to ceftriaxone sodium; cephalexin; clindamycin; dilaudid; oxycodone-acetaminophen; rocephin; and sulfonamide derivatives.  MEDICATIONS:  Current Outpatient Prescriptions  Medication Sig Dispense Refill  . albuterol (PROVENTIL) (2.5 MG/3ML) 0.083% nebulizer solution 2.5 mg every 4 (four) hours as needed. As needed for shortness of breath.      . ALPRAZolam (XANAX) 0.5 MG tablet 1/2-1 tablet three times a day as needed for nerves  90 tablet  5  . amLODipine (NORVASC) 10 MG tablet Take 1 tablet (10 mg total) by mouth daily.  30 tablet  11  . atorvastatin (LIPITOR) 40 MG tablet Take 1 tablet (40 mg total) by mouth daily.  30 tablet  11  . benazepril (LOTENSIN) 40 MG tablet Take 1 tablet (40 mg total) by mouth daily.  30 tablet  11  . budesonide-formoterol (SYMBICORT) 80-4.5 MCG/ACT inhaler Inhale 2 puffs into the lungs 2 (two) times daily. Rinse mouth out well afterwards.  1 Inhaler  3  . Cholecalciferol (VITAMIN D) 2000 UNITS tablet Take 2,000 Units by mouth daily.        Marland Kitchen  hydrochlorothiazide 25 MG tablet 1/2 to 1 tablet by mouth once a day as needed for swelling       . HYDROcodone-acetaminophen (NORCO) 5-325 MG per tablet Take 1 tablet by mouth every 6 (six) hours as needed.      . metFORMIN (GLUCOPHAGE) 500 MG tablet Take 1 tablet (500 mg total) by mouth 2 (two)  times daily with a meal.  60 tablet  5  . naproxen (NAPROSYN) 250 MG tablet Take 250 mg by mouth daily as needed. As needed for bursitis pain in hip       . omeprazole (PRILOSEC) 20 MG capsule Take 1 capsule (20 mg total) by mouth daily.  30 capsule  11  . potassium chloride SA (K-DUR,KLOR-CON) 20 MEQ tablet Take 20 mEq by mouth daily as needed. (take with the HCTZ)       . sertraline (ZOLOFT) 50 MG tablet Take 1 tablet (50 mg total) by mouth daily.  30 tablet  11  . sitaGLIPtin (JANUVIA) 50 MG tablet Take 50 mg by mouth daily.      Marland Kitchen dexamethasone (DECADRON) 4 MG tablet Take 2 tablets by mouth once a day on the day after chemotherapy and then take 2 tablets two times a day for 2 days. Take with food.  30 tablet  1  . LORazepam (ATIVAN) 0.5 MG tablet Take 1 tablet (0.5 mg total) by mouth every 6 (six) hours as needed (Nausea or vomiting).  30 tablet  0  . ondansetron (ZOFRAN) 8 MG tablet Take 1 tablet two times a day as needed for nausea or vomiting starting on the third day after chemotherapy.  30 tablet  1  . prochlorperazine (COMPAZINE) 10 MG tablet Take 1 tablet (10 mg total) by mouth every 6 (six) hours as needed (Nausea or vomiting).  30 tablet  1  . prochlorperazine (COMPAZINE) 25 MG suppository Place 1 suppository (25 mg total) rectally every 12 (twelve) hours as needed for nausea.  12 suppository  3    SURGICAL HISTORY:  Past Surgical History  Procedure Date  . Laminectomy [id number]    s/p-Dr. Jule Ser  . Bladder tack 1974  . Cholecystectomy 02/23/1991    [facility] - Dr Jamey Ripa  . Tonsillectomy   . Rotator cuff repair 8/03    right, Dr. Shelle Iron  . Enterocele repair 06/08    Dr. Estanislado Pandy  . Rotator cuff repair 10/26/2008    left, Dr. Rayburn Ma  . Hip surgery 08/27/2010    DR. Despina Hick  . Breast excisional biopsy 11/10/1988    left benign Dr Jamey Ripa  . Breast excisional biopsy 03/04/1983    Right - two areas - Dr Jamey Ripa  . Breast excisional biopsy 10/09/1980    right - Dr Jamey Ripa  . Breast  excisional biopsy 10/18/1979    left - Dr Jamey Ripa  . Breast excisional biopsy 01/02/1994    right - Dr Jamey Ripa  . Vaginal hysterectomy 1974    partial   . Colonoscopy     REVIEW OF SYSTEMS:  Pertinent items are noted in HPI.   PHYSICAL EXAMINATION: General appearance: alert, cooperative and appears stated age Neck: no adenopathy, no carotid bruit, no JVD, supple, symmetrical, trachea midline and thyroid not enlarged, symmetric, no tenderness/mass/nodules Lymph nodes: Cervical, supraclavicular, and axillary nodes normal. Resp: clear to auscultation bilaterally and normal percussion bilaterally Back: symmetric, no curvature. ROM normal. No CVA tenderness. Cardio: regular rate and rhythm, S1, S2 normal, no murmur, click, rub or gallop GI: soft, non-tender; bowel sounds normal;  no masses,  no organomegaly Extremities: extremities normal, atraumatic, no cyanosis or edema Neurologic: Grossly normal Left mastectomy scar is bandaged there 2 JP drains in place. There is no evidence of excessive bleeding.  ECOG PERFORMANCE STATUS: 1 - Symptomatic but completely ambulatory  Blood pressure 157/82, pulse 70, temperature 97.5 F (36.4 C), temperature source Oral, height 5\' 8"  (1.727 m), weight 197 lb 8 oz (89.585 kg).  LABORATORY DATA: Lab Results  Component Value Date   WBC 5.9 03/03/2011   HGB 13.5 03/23/2011   HCT 41.2 03/03/2011   MCV 86.1 03/03/2011   PLT 249 03/03/2011      Chemistry      Component Value Date/Time   NA 137 03/20/2011 1400   K 4.5 03/20/2011 1400   CL 101 03/20/2011 1400   CO2 27 03/20/2011 1400   BUN 18 03/20/2011 1400   CREATININE 0.65 03/20/2011 1400      Component Value Date/Time   CALCIUM 9.8 03/20/2011 1400   ALKPHOS 100 03/03/2011 1342   AST 24 03/03/2011 1342   ALT 24 03/03/2011 1342   BILITOT 0.4 03/03/2011 1342     ADDITIONAL INFORMATION: 4. CHROMOGENIC IN-SITU HYBRIDIZATION Interpretation HER-2/NEU BY CISH - NO AMPLIFICATION OF HER-2 DETECTED. THE RATIO OF  HER-2: CEP 17 SIGNALS WAS 1.52. Reference range: Ratio: HER2:CEP17 < 1.8 - gene amplification not observed Ratio: HER2:CEP 17 1.8-2.2 - equivocal result Ratio: HER2:CEP17 > 2.2 - gene amplification observed Pecola Leisure MD Pathologist, Electronic Signature ( Signed 03/27/2011) FINAL DIAGNOSIS 1 of 4 FINAL for Hailey, [name] ([id number]) Diagnosis 1. Lymph node, sentinel, biopsy, left axilla - ONE LYMPH NODE, POSITIVE FOR METASTATIC MAMMARY CARCINOMA (1/1). - TUMOR DEPOSIT SI 1.0 CM. - FOCAL EXTRACAPSULAR TUMOR EXTENSION PRESENT. 2. Lymph node, sentinel, biopsy, left axilla - ONE LYMPH NODE, POSITIVE FOR METASTATIC MAMMARY CARCINOMA (1/1). - TUMOR DEPOSIT SI 1.8 CM. - FOCAL EXTRACAPSULAR TUMOR EXTENSION PRESENT. 3. Lymph node, sentinel, biopsy, left axilla - ONE LYMPH NODE, POSITIVE FOR METASTATIC MAMMARY CARCINOMA (1/1). - TUMOR DEPOSIT SI 0.5 CM. - FOCAL EXTRACAPSULAR TUMOR EXTENSION PRESENT. 4. Breast, modified radical mastectomy , , left breast - INVASIVE LOBULAR CARCINOMA (6.0 CM). - LYMPHOVASCULAR INVASION IDENTIFIED. - PERINEURAL INVASION IDENTIFIED. - INVASIVE TUMOR IS 2.9 CM FROM THE NEAREST MARGIN (DEEP). - LOBULAR CARCINOMA IN SITU. - SIX LYMPH NODES POSITIVE FOR METASTATIC MAMMARY CARCINOMA (6/13). - TUMOR DEPOSITS ARE 0.5 CM, 0.8 CM, 0.7 CM, 0.7 CM, 0.3 CM, AND 1.0 CM. - FOCAL EXTRACAPSULAR TUMOR EXTENSION PRESENT. - SEE TUMOR SYNOPTIC TEMPLATE BELOW. Microscopic Comment 4. BREAST, INVASIVE TUMOR, WITHOUT LYMPH NODE SAMPLING Specimen, including laterality: Left breast Procedure: Radical mastectomy Grade: III of III Tubule formation: 3 Nuclear pleomorphism: 3 Mitotic: 2 Tumor size (gross measurement): 6.0 cm Margins: Invasive, distance to closest margin: 2.9 cm In-situ, distance to closest margin: 2.9 cm (deep) If margin positive, focally or broadly: N/A Lymphovascular invasion: Present Ductal carcinoma in situ: None Extensive intraductal component:  N/A Lobular neoplasia: Present (lobular carcinoma in situ and atypical lobular hyperplasia Tumor focality: Unifocal Treatment effect: None If present, treatment effect in breast tissue, lymph nodes or both: N/A Extent of tumor: Skin: grossly negative Nipple: grossly negative Skeletal muscle: N/A Lymph nodes, number examined: 16; number with metastasis: 9 Isolated tumor cells: None Micrometastasis: None Macrometastasis: 6 lymph nodes (tumor deposits: 0.5 cm, 0.8 cm, 0.7 cm, 0.7 cm, 0.3 cm, 1.0 cm, 1.0 cm, 1.8 cm, and 0.5 cm) Extracapsular extension: Focally present in multiple lymph nodes 2 of 4 FINAL for  Hailey, [name] [phone]) Microscopic Comment(continued) Breast prognostic profile: Estrogen receptor: Not repeated, previous study demonstrated 100% positivity ([id number]) Progesterone receptor: Not repeated, previous study demonstrated 100% positivity ([id number]) Her 2 neu: Repeated, previous study demonstrated no amplification (1.31) ([id number]) Ki-67: Not repeated, previous study demonstrated 18% proliferation rate. [phone]) Non-neoplastic breast: Benign fibrocystic change, dense parenchymal fibrosis, sclerosing adenosis, fibroadenomatoid nodules and microcalcifications in benign ducts and lobules. (CR:jy) 03/24/11 TNM: pT3 pN2a Comments: Cytokeratin AE1/3 immunostain was preformed on the lymph nodes that, by H&E, were negative for malignancy. There are no additional tumor deposits identified in lymph nodes. [place] RUND DO Pathologist, Electronic Signature (Case signed 03/25/2011) Intraoperative Diagnosis PART 1: METASTATIC CARCINOMA. (JK) Specimen Gross and Clinical Information Specimen(s) Obtained: 1. Lymph node, sentinel, biopsy, left axilla 2. Lymph node, sentinel, biopsy, left axilla 3. Lymph node, sentinel, biopsy, left axilla 4. Breast, modified radical mastectomy , , left breast Specimen Clinical Information  RADIOGRAPHIC STUDIES:  Nm Sentinel Node  Inj-no Rpt (breast)  03/23/2011  CLINICAL DATA: breast cancer left   Sulfur colloid was injected intradermally by the nuclear medicine  technologist for breast cancer sentinel node localization.      ASSESSMENT: 71 year old female with  #1 invasive lobular carcinoma of the left breast status post mastectomy with axillary lymph node dissection. The final pathology revealed a T3 N2 way invasive lobular carcinoma. The tumor measured 6.0 cm 9 of 16 lymph nodes were positive for metastatic disease. Tumor was ER positive PR positive HER-2/neu negative proliferation marker 18%.  #2 patient will require adjuvant systemic treatment consisting of chemotherapy followed by adjuvant antiestrogen therapy.  #3 patient will also need postmastectomy radiation therapy   PLAN:   #1 the patient I. And her family had a very lengthy 40 minute discussion regarding the final pathology. I went over the details of the pathology including the number a lymph node is found to be positive for metastatic disease as well as the size of the tumor and other prognostic characteristics. She understands that this is a high-risk disease. Recommendation made is adjuvant chemotherapy consisting of 5-FU epirubicin and Cytoxan given dose dense for a total of 4 cycles. This would then be followed by Taxotere given every 2 weeks for a total of 4 cycles. She will need G-CSF support with Neulasta.  #2 we discussed the risks and benefits of chemotherapy as well as anti-estrogen therapy.  #3 patient will need a Port-A-Cath placed as well as getting a MUGA scan for heart evaluation. She will also need chemotherapy teaching class. All of these arrangements have been made.  #4 because of patient's nature of disease and high risk I have recommended doing staging scans including PET/CT.  #5 prescriptions were sent to her pharmacy including Zofran Compazine dexamethasone. As well as EMLA cream.  #6 I have sent a message to Dr. Jamey Ripa for port  placement. I have also made a referral to Dr. Ballard Russell for radiation therapy.  #7 patient will return on 04/17/2011 in followup and hopefully to start first cycle of her chemotherapy.   All questions were answered. The patient knows to call the clinic with any problems, questions or concerns. We can certainly see the patient much sooner if necessary.  I spent 55 minutes counseling the patient face to face. The total time spent in the appointment was 55 minutes.    Drue Second, MD Medical/Oncology [facility] [phone] (beeper) [phone] (Office)  03/30/2011, 5:38 PM

## 2011-03-30 NOTE — Patient Instructions (Signed)
1. Your stage is Stage III. 2. We discussed chemotherapy (FEC followed by Taxotere) 3. Staging scans with PET/CT 4. You will need port a cath for chemotherapy administeration 5. You will need MUGA for evaluation of heart function 6. Chemotherapy teaching class.

## 2011-03-31 ENCOUNTER — Telehealth: Payer: Self-pay | Admitting: Oncology

## 2011-03-31 NOTE — Telephone Encounter (Signed)
Wrong note entered under the wrong md. Last note is for dr Welton Flakes. Pt is aware of her appt with dr Dayton Scrape

## 2011-03-31 NOTE — Telephone Encounter (Signed)
S/w the pt and she is aware of her muga/ct scan/pet scan appts with instructions. lmonvm of the scheduling line regarding the pt needing an appt with dr Jamey Ripa for port placement. Pt has an appt with dr Jamey Ripa on 04/01/2011 for something else.

## 2011-03-31 NOTE — Telephone Encounter (Signed)
Pt is aware of her rad onc appt with dr Dayton Scrape

## 2011-03-31 NOTE — Telephone Encounter (Signed)
S/w jade from [facility] regarding the pt's port appt with dr Jamey Ripa. Pt is aware we will call her with the restt of her appts

## 2011-04-01 ENCOUNTER — Ambulatory Visit (INDEPENDENT_AMBULATORY_CARE_PROVIDER_SITE_OTHER): Payer: Medicare Other | Admitting: Surgery

## 2011-04-01 ENCOUNTER — Encounter: Payer: Self-pay | Admitting: Radiation Oncology

## 2011-04-01 ENCOUNTER — Ambulatory Visit
Admission: RE | Admit: 2011-04-01 | Discharge: 2011-04-01 | Disposition: A | Discharge status: Home / Self Care | Payer: Medicare Other | Source: Ambulatory Visit | Attending: Radiation Oncology | Admitting: Radiation Oncology

## 2011-04-01 ENCOUNTER — Encounter (INDEPENDENT_AMBULATORY_CARE_PROVIDER_SITE_OTHER): Payer: Self-pay | Admitting: Surgery

## 2011-04-01 VITALS — BP 152/90 | HR 76 | Temp 97.6°F | Resp 20 | Ht 68.0 in | Wt 197.8 lb

## 2011-04-01 VITALS — BP 145/70 | HR 77 | Temp 96.7°F | Resp 18 | Ht 68.0 in | Wt 197.7 lb

## 2011-04-01 DIAGNOSIS — Z9889 Other specified postprocedural states: Secondary | ICD-10-CM

## 2011-04-01 DIAGNOSIS — C50319 Malignant neoplasm of lower-inner quadrant of unspecified female breast: Secondary | ICD-10-CM

## 2011-04-01 NOTE — Progress Notes (Signed)
Patient presents to the clinic today accompanied by her son for a follow up new consult with Dr. Dayton Scrape. Patient is alert and oriented to person, place, and time. No distress noted. Steady gait noted. Flat affect noted. Patient reports left breast pain 7 on a scale of 0-10 related to the effects of surgery. Two J.P. drain to bulb suction noted each with 5 cc of serosanguinous fluid. Patient reports that she is scheduled to see Dr. Jamey Ripa today. Patient is hoping her drains will be removed today. Patient denies nausea, vomiting, headache, or diarrhea. Reported all findings to Dr. Dayton Scrape.

## 2011-04-01 NOTE — Progress Notes (Signed)
[name]    [id number] 04/01/2011    Apr 03, 1940   CC: Post op left MRM  HPI: The patient returns for post op follow-up. She underwent a Left MRM on 03/23/11. Over all she feels that she is doing well. Minimal pain. JP's slowing  PE: The incision is healing and there is no evidence of infection or hematoma.There is some skin slough of the superior flap.  The drains are 22.5 and 45 last 24h.  DATA REVIEWED: Pathology report showed T3N2a ILC  IMPRESSION: Patient doing well. Mild skin loss of superior flap  PLAN: Her next visit will be in one week. Medial drain removed. Will schedule PAC. May not be healed enough for chem by 3/15.

## 2011-04-01 NOTE — Progress Notes (Signed)
Patient returns today for a follow up new consult with Dr. Dayton Scrape. 71 year old female with stage III invasive lobular carcinoma of the left breast status post mastectomy with left axillary lymph node dissection performed on 03/23/2011. The final pathology revealed a T3 N2 invasive lobular carcinoma Tumor measured 6.0 cm with 9 of 16 lymph nodes positive for metastatic disease. ER, PR positive and HER 2 negative proliferation marker 18%. Dr Welton Flakes plan to proceed with adjuvant systemic treatment consisting of chemotherapy followed by adjuvant antiestrogen treatment. Also, Dr. Welton Flakes has made arrangement for port a cath placement, PET/CT, and MUGA scan. Dr. Welton Flakes plans for this patient to return on 04/17/2011 for her first cycle of chemotherapy.

## 2011-04-01 NOTE — Progress Notes (Signed)
Please see the Nurse Progress Note in the MD Initial Consult Encounter for this patient. 

## 2011-04-01 NOTE — Progress Notes (Signed)
Followup note:  Diagnosis: Stage IIIA (T3 N2 MX) invasive lobular carcinoma of the left breast  History: [name] is a 71 year old female who is seen today for review and to address post mastectomy radiation therapy. I first saw her in consultation on 03/04/2011 at which time she presented with clinical stage TIII N0 invasive lobular carcinoma of the left breast. She underwent a left modified radical mastectomy on 03/23/2011 by Dr. Jamey Ripa. She is found to have a 6.0 cm invasive lobular carcinoma with widely negative margins. There was LV I. and perineural invasion. A total of 9 of 16 lymph nodes contained metastatic disease. Extracapsular extension was seen focally in multiple lymph nodes. She was recently seen by Dr. Welton Flakes on 03/30/2011 and she plans to complete her staging workup with a PET scan which is  scheduled for later next week. She recommends at the chemotherapy followed by post mastectomy radiation therapy and then adjuvant antiestrogen therapy. She is obviously quite distraught by her surgical findings and necessary adjuvant therapies. She sees Dr. Jamey Ripa later today for check of her wound, and possible removal of her drains.  Physical examination: Head and neck examination grossly unremarkable. Nodes: Without palpable cervical, supraclavicular, or axillary lymphadenopathy. Her surgical dressings along left chest wall and lower axilla are not removed. She has to drains, 1 presumably from the axilla and the other from the chest wall. Extremities: Without edema.  Impression: Pathologic stage IIIa (T3 N2 MX) invasive lobular carcinoma of the left breast.  Plan: She'll complete her staging workup with a PET scan later next week. She will have adjuvant chemotherapy. I asked that Dr. Welton Flakes contact me nearing completion of chemotherapy so I can see her for a followup prior to initiation of left chest wall and regional node irradiation. We discussed the potential acute and late toxicities of  radiation therapy including increased risk for left upper extremity lymphedema along with possible pulmonary/cardiac toxicity although we can usually avoid the cardiac silhouette.   30 minutes was spent face-to-face with the patient and her son, primarily counseling the patient.

## 2011-04-01 NOTE — Progress Notes (Signed)
Complete NUTRITION RISK SCREEN worksheet without concerns submitted to Zenovia Jarred, RD. Also, completed PATIENT MEASURE OF DISTRESS worksheet with a score of 10 submitted to social work. Patient did not see social work today because she "has to get to Dr. Lamount Cranker office for follow up." Contacted Roselee Nova of social work via telephone to flag patient for follow up.

## 2011-04-02 ENCOUNTER — Institutional Professional Consult (permissible substitution): Payer: Medicare Other | Admitting: Radiation Oncology

## 2011-04-02 ENCOUNTER — Ambulatory Visit: Payer: Medicare Other

## 2011-04-02 ENCOUNTER — Telehealth: Payer: Self-pay | Admitting: *Deleted

## 2011-04-02 NOTE — Progress Notes (Signed)
Encounter addended by: Ardell Isaacs, RN on: 04/02/2011 11:57 AM<BR>     Documentation filed: Charges VN, Inpatient Patient Education

## 2011-04-02 NOTE — Telephone Encounter (Signed)
S/w the pt and she is aware to get an appt calendar when she comes in for her chemo educ class

## 2011-04-03 ENCOUNTER — Encounter (HOSPITAL_COMMUNITY)
Admission: RE | Admit: 2011-04-03 | Discharge: 2011-04-03 | Disposition: A | Discharge status: Home / Self Care | Payer: Medicare Other | Source: Ambulatory Visit | Attending: Oncology | Admitting: Oncology

## 2011-04-03 ENCOUNTER — Encounter (HOSPITAL_COMMUNITY): Payer: Self-pay

## 2011-04-03 DIAGNOSIS — C50319 Malignant neoplasm of lower-inner quadrant of unspecified female breast: Secondary | ICD-10-CM | POA: Insufficient documentation

## 2011-04-03 DIAGNOSIS — Z9221 Personal history of antineoplastic chemotherapy: Secondary | ICD-10-CM | POA: Insufficient documentation

## 2011-04-03 MED ORDER — TECHNETIUM TC 99M-LABELED RED BLOOD CELLS IV KIT
24.0000 | PACK | Freq: Once | INTRAVENOUS | Status: AC | PRN
  Administered 2011-04-03: 24 via INTRAVENOUS

## 2011-04-06 ENCOUNTER — Encounter (INDEPENDENT_AMBULATORY_CARE_PROVIDER_SITE_OTHER): Payer: Self-pay | Admitting: Surgery

## 2011-04-06 ENCOUNTER — Telehealth: Payer: Self-pay | Admitting: Pulmonary Disease

## 2011-04-06 ENCOUNTER — Ambulatory Visit (INDEPENDENT_AMBULATORY_CARE_PROVIDER_SITE_OTHER): Payer: Medicare Other | Admitting: Surgery

## 2011-04-06 ENCOUNTER — Encounter: Payer: Self-pay | Admitting: *Deleted

## 2011-04-06 VITALS — BP 148/82 | HR 68 | Temp 97.4°F | Resp 18 | Ht 68.0 in | Wt 194.6 lb

## 2011-04-06 DIAGNOSIS — Z9889 Other specified postprocedural states: Secondary | ICD-10-CM

## 2011-04-06 MED ORDER — HYDROCODONE-ACETAMINOPHEN 5-325 MG PO TABS: 1.0000 | ORAL_TABLET | Freq: Four times a day (QID) | ORAL | Status: DC | PRN

## 2011-04-06 NOTE — Telephone Encounter (Signed)
Reviewed pt's chart. Looks like rx was already sent on 01/14/11 for # 30 x 11 refills.   Called and spoke with Nicolette Bang on [address] and was informed that they did indeed receive our refill request and pt has 11 refills remaining.   Called and spoke with pt and informed her of this.  Nothing further needed.

## 2011-04-06 NOTE — Progress Notes (Signed)
[name]    [id number] 04/06/2011    07/14/1940   CC: Post op left MRM  HPI: The patient returns for post op follow-up. She underwent a Left MRM on 03/23/11. Over all she feels that she is doing well. Minimal pain. JP slowing  PE: The incision is healing and there is no evidence of infection or hematoma.There is some skin slough of the superior flap, no infection  The drain is still > 30 cc/24  DATA REVIEWED:   IMPRESSION: Patient doing well. Mild skin loss of superior flap  PLAN: She will call if the drain slows. She is scheduled for port in one week

## 2011-04-06 NOTE — Patient Instructions (Signed)
If the drain slows to less than 30cc in 24 hours, call and we will have you come back to have the drain removed. Keep a sterile dressing with some antibiotic ointment on the drain site.

## 2011-04-06 NOTE — Progress Notes (Signed)
Addended byLiliana Cline on: 04/06/2011 08:51 AM   Modules accepted: Orders

## 2011-04-06 NOTE — Progress Notes (Signed)
[facility] Psychosocial Distress Screening Clinical Social Work  Clinical Social Work was referred by distress screening protocol.  The patient scored a 10 on the Psychosocial Distress Thermometer which indicates severe distress. Clinical Social Worker was unable to meet with pt in the exam room; therefore contacted pt at home to assess for distress and other psychosocial needs.  Pt stated she was "not doing to well" today, but did not have any urgent needs at this time.  Pt did express need for emotional support and adjusting to her illness.  CSW informed pt of the supportive services and support team at [facility].  Pt is scheduled to be at Chemo education class on 3/5, and plans to meet with CSW afterwards to discuss emotional needs and support services.  Pt was appreciative of contact.  CSW encouraged pt to call with any questions or concerns.      Clinical Social Worker follow up needed: yes If yes, follow up plan: CSW and pt are scheduled to meet of 3/5 to discuss emotional needs and supportive services.    Tamala Julian, MSW, LCSW Clinical Social Worker [facility] [phone]

## 2011-04-07 ENCOUNTER — Encounter (HOSPITAL_BASED_OUTPATIENT_CLINIC_OR_DEPARTMENT_OTHER): Payer: Self-pay | Admitting: *Deleted

## 2011-04-07 ENCOUNTER — Other Ambulatory Visit: Payer: Self-pay | Admitting: Pulmonary Disease

## 2011-04-07 ENCOUNTER — Other Ambulatory Visit: Payer: Medicare Other

## 2011-04-07 ENCOUNTER — Encounter: Payer: Self-pay | Admitting: *Deleted

## 2011-04-07 MED ORDER — SERTRALINE HCL 50 MG PO TABS: 50.0000 mg | ORAL_TABLET | Freq: Every day | ORAL | Status: DC

## 2011-04-07 NOTE — Progress Notes (Signed)
Clinical Child psychotherapist met with pt and pt's husband at [facility] after the Chemo education class.  Pt presented as anxious and was tearful at times.  CSW and pt processed her feelings associated with her diagnosis and treatment plan.  Pt is continuing to struggle with adjusting to her illness.  She also expressed concerns for her physical appearance.  CSW validated the pt's concerns and discussed available resources.  CSW informed pt of the Reach to Recovery program; which pt agreed to CSW making referral.  CSW also informed pt of the Look Good Feel Better program.  Pt expressed interest, but did not feel she was ready to participate at this time.  CSW and pt discussed the multiple counseling opportunities through the [facility] support team.  Pt is open to support services and recognizes the need for emotional care/support.  CSW encouraged pt to call with any questions or concerns.  CSW will continue to follow and support as needed.  Tamala Julian, MSW, LCSW Clinical Social Worker [facility] [phone]

## 2011-04-08 ENCOUNTER — Telehealth: Payer: Self-pay

## 2011-04-08 ENCOUNTER — Other Ambulatory Visit: Payer: Medicare Other

## 2011-04-08 NOTE — Telephone Encounter (Signed)
Pt informed of MD's advisement. 

## 2011-04-08 NOTE — Telephone Encounter (Signed)
Call if cbg goes over 200 

## 2011-04-08 NOTE — Telephone Encounter (Signed)
Pt called stating she will begin chemotherapy in 1 week and is requesting MD advise on any adjustments to her DM routine? Please advise.

## 2011-04-09 ENCOUNTER — Encounter (HOSPITAL_COMMUNITY): Payer: Self-pay

## 2011-04-09 ENCOUNTER — Encounter (HOSPITAL_COMMUNITY)
Admission: RE | Admit: 2011-04-09 | Discharge: 2011-04-09 | Disposition: A | Discharge status: Home / Self Care | Payer: Medicare Other | Source: Ambulatory Visit | Attending: Oncology | Admitting: Oncology

## 2011-04-09 DIAGNOSIS — Z9010 Acquired absence of unspecified breast and nipple: Secondary | ICD-10-CM | POA: Insufficient documentation

## 2011-04-09 DIAGNOSIS — C50319 Malignant neoplasm of lower-inner quadrant of unspecified female breast: Secondary | ICD-10-CM

## 2011-04-09 LAB — GLUCOSE, CAPILLARY: Glucose-Capillary: 189 mg/dL — ABNORMAL HIGH (ref 70–99)

## 2011-04-09 MED ORDER — IOHEXOL 300 MG/ML  SOLN
100.0000 mL | Freq: Once | INTRAMUSCULAR | Status: AC | PRN
  Administered 2011-04-09: 100 mL via INTRAVENOUS

## 2011-04-09 MED ORDER — FLUDEOXYGLUCOSE F - 18 (FDG) INJECTION
19.7000 | Freq: Once | INTRAVENOUS | Status: AC | PRN
  Administered 2011-04-09: 19.7 via INTRAVENOUS

## 2011-04-10 ENCOUNTER — Encounter (HOSPITAL_BASED_OUTPATIENT_CLINIC_OR_DEPARTMENT_OTHER)
Admission: RE | Admit: 2011-04-10 | Discharge: 2011-04-10 | Disposition: A | Discharge status: Home / Self Care | Payer: Medicare Other | Source: Ambulatory Visit | Attending: Surgery | Admitting: Surgery

## 2011-04-10 LAB — BASIC METABOLIC PANEL
Calcium: 9.4 mg/dL (ref 8.4–10.5)
GFR calc non Af Amer: 87 mL/min — ABNORMAL LOW (ref >90)
Glucose, Bld: 233 mg/dL — ABNORMAL HIGH (ref 70–99)
Potassium: 4.1 mEq/L (ref 3.5–5.1)
Sodium: 140 mEq/L (ref 135–145)

## 2011-04-13 ENCOUNTER — Ambulatory Visit (HOSPITAL_BASED_OUTPATIENT_CLINIC_OR_DEPARTMENT_OTHER)
Admission: RE | Admit: 2011-04-13 | Discharge: 2011-04-13 | Disposition: A | Discharge status: Home / Self Care | Payer: Medicare Other | Source: Ambulatory Visit | Attending: Surgery | Admitting: Surgery

## 2011-04-13 ENCOUNTER — Ambulatory Visit (HOSPITAL_BASED_OUTPATIENT_CLINIC_OR_DEPARTMENT_OTHER): Payer: Medicare Other | Admitting: Certified Registered Nurse Anesthetist

## 2011-04-13 ENCOUNTER — Ambulatory Visit (HOSPITAL_COMMUNITY): Payer: Medicare Other

## 2011-04-13 ENCOUNTER — Encounter (HOSPITAL_BASED_OUTPATIENT_CLINIC_OR_DEPARTMENT_OTHER): Payer: Self-pay | Admitting: *Deleted

## 2011-04-13 ENCOUNTER — Ambulatory Visit: Payer: Medicare Other

## 2011-04-13 ENCOUNTER — Encounter (HOSPITAL_BASED_OUTPATIENT_CLINIC_OR_DEPARTMENT_OTHER): Payer: Self-pay | Admitting: Certified Registered Nurse Anesthetist

## 2011-04-13 ENCOUNTER — Encounter (HOSPITAL_BASED_OUTPATIENT_CLINIC_OR_DEPARTMENT_OTHER)
Admission: RE | Disposition: A | Discharge status: Home / Self Care | Payer: Self-pay | Source: Ambulatory Visit | Attending: Surgery

## 2011-04-13 DIAGNOSIS — F411 Generalized anxiety disorder: Secondary | ICD-10-CM | POA: Insufficient documentation

## 2011-04-13 DIAGNOSIS — J45909 Unspecified asthma, uncomplicated: Secondary | ICD-10-CM | POA: Insufficient documentation

## 2011-04-13 DIAGNOSIS — E119 Type 2 diabetes mellitus without complications: Secondary | ICD-10-CM | POA: Insufficient documentation

## 2011-04-13 DIAGNOSIS — E7800 Pure hypercholesterolemia, unspecified: Secondary | ICD-10-CM | POA: Insufficient documentation

## 2011-04-13 DIAGNOSIS — C50319 Malignant neoplasm of lower-inner quadrant of unspecified female breast: Secondary | ICD-10-CM

## 2011-04-13 DIAGNOSIS — C50519 Malignant neoplasm of lower-outer quadrant of unspecified female breast: Secondary | ICD-10-CM | POA: Insufficient documentation

## 2011-04-13 DIAGNOSIS — C50919 Malignant neoplasm of unspecified site of unspecified female breast: Secondary | ICD-10-CM

## 2011-04-13 DIAGNOSIS — Z79899 Other long term (current) drug therapy: Secondary | ICD-10-CM | POA: Insufficient documentation

## 2011-04-13 DIAGNOSIS — E669 Obesity, unspecified: Secondary | ICD-10-CM | POA: Insufficient documentation

## 2011-04-13 DIAGNOSIS — F3289 Other specified depressive episodes: Secondary | ICD-10-CM | POA: Insufficient documentation

## 2011-04-13 DIAGNOSIS — K219 Gastro-esophageal reflux disease without esophagitis: Secondary | ICD-10-CM | POA: Insufficient documentation

## 2011-04-13 DIAGNOSIS — F329 Major depressive disorder, single episode, unspecified: Secondary | ICD-10-CM | POA: Insufficient documentation

## 2011-04-13 DIAGNOSIS — M1990 Unspecified osteoarthritis, unspecified site: Secondary | ICD-10-CM | POA: Insufficient documentation

## 2011-04-13 HISTORY — PX: PORTACATH PLACEMENT: SHX2246

## 2011-04-13 LAB — GLUCOSE, CAPILLARY
Glucose-Capillary: 207 mg/dL — ABNORMAL HIGH (ref 70–99)
Glucose-Capillary: 189 mg/dL — ABNORMAL HIGH (ref 70–99)

## 2011-04-13 LAB — POCT HEMOGLOBIN-HEMACUE: Hemoglobin: 12.4 g/dL (ref 12.0–15.0)

## 2011-04-13 SURGERY — INSERTION, TUNNELED CENTRAL VENOUS DEVICE, WITH PORT
Anesthesia: General | Site: Chest | Wound class: Clean

## 2011-04-13 MED ORDER — CHLORHEXIDINE GLUCONATE 4 % EX LIQD: 1.0000 "application " | Freq: Once | CUTANEOUS | Status: DC

## 2011-04-13 MED ORDER — LACTATED RINGERS IV SOLN
INTRAVENOUS | Status: DC
  Administered 2011-04-13: 08:00:00 via INTRAVENOUS

## 2011-04-13 MED ORDER — BACITRACIN-NEOMYCIN-POLYMYXIN 400-5-5000 EX OINT
TOPICAL_OINTMENT | CUTANEOUS | Status: DC | PRN
  Administered 2011-04-13: 1 via TOPICAL

## 2011-04-13 MED ORDER — PROPOFOL 10 MG/ML IV EMUL
INTRAVENOUS | Status: DC | PRN
  Administered 2011-04-13: 200 mg via INTRAVENOUS

## 2011-04-13 MED ORDER — EPHEDRINE SULFATE 50 MG/ML IJ SOLN
INTRAMUSCULAR | Status: DC | PRN
  Administered 2011-04-13: 10 mg via INTRAVENOUS

## 2011-04-13 MED ORDER — FENTANYL CITRATE 0.05 MG/ML IJ SOLN
25.0000 ug | INTRAMUSCULAR | Status: DC | PRN
  Administered 2011-04-13 (×2): 25 ug via INTRAVENOUS
  Administered 2011-04-13: 50 ug via INTRAVENOUS

## 2011-04-13 MED ORDER — HYDROCODONE-ACETAMINOPHEN 5-325 MG PO TABS
1.0000 | ORAL_TABLET | Freq: Once | ORAL | Status: AC
  Administered 2011-04-13: 1 via ORAL

## 2011-04-13 MED ORDER — ONDANSETRON HCL 4 MG/2ML IJ SOLN
INTRAMUSCULAR | Status: DC | PRN
  Administered 2011-04-13: 4 mg via INTRAVENOUS

## 2011-04-13 MED ORDER — LIDOCAINE HCL (CARDIAC) 20 MG/ML IV SOLN
INTRAVENOUS | Status: DC | PRN
  Administered 2011-04-13: 60 mg via INTRAVENOUS

## 2011-04-13 MED ORDER — HEPARIN SOD (PORK) LOCK FLUSH 100 UNIT/ML IV SOLN
INTRAVENOUS | Status: DC | PRN
  Administered 2011-04-13: 500 [IU] via INTRAVENOUS

## 2011-04-13 MED ORDER — CIPROFLOXACIN IN D5W 400 MG/200ML IV SOLN
400.0000 mg | INTRAVENOUS | Status: AC
  Administered 2011-04-13: 400 mg via INTRAVENOUS

## 2011-04-13 MED ORDER — HEPARIN (PORCINE) IN NACL 2-0.9 UNIT/ML-% IJ SOLN
INTRAMUSCULAR | Status: DC | PRN
  Administered 2011-04-13: 1 via INTRAVENOUS

## 2011-04-13 MED ORDER — BUPIVACAINE HCL (PF) 0.25 % IJ SOLN
INTRAMUSCULAR | Status: DC | PRN
  Administered 2011-04-13: 13 mL

## 2011-04-13 MED ORDER — FENTANYL CITRATE 0.05 MG/ML IJ SOLN
INTRAMUSCULAR | Status: DC | PRN
  Administered 2011-04-13: 50 ug via INTRAVENOUS

## 2011-04-13 SURGICAL SUPPLY — 53 items
ADH SKN CLS APL DERMABOND .7 (GAUZE/BANDAGES/DRESSINGS) ×1
APL SKNCLS STERI-STRIP NONHPOA (GAUZE/BANDAGES/DRESSINGS)
BAG DECANTER FOR FLEXI CONT (MISCELLANEOUS) ×2 IMPLANT
BENZOIN TINCTURE PRP APPL 2/3 (GAUZE/BANDAGES/DRESSINGS) IMPLANT
BLADE HEX COATED 2.75 (ELECTRODE) ×2 IMPLANT
BLADE SURG 15 STRL LF DISP TIS (BLADE) ×1 IMPLANT
BLADE SURG 15 STRL SS (BLADE) ×2
CANISTER SUCTION 1200CC (MISCELLANEOUS) IMPLANT
CHLORAPREP W/TINT 26ML (MISCELLANEOUS) ×2 IMPLANT
CLOTH BEACON ORANGE TIMEOUT ST (SAFETY) ×2 IMPLANT
COVER MAYO STAND STRL (DRAPES) ×2 IMPLANT
COVER TABLE BACK 60X90 (DRAPES) ×2 IMPLANT
DECANTER SPIKE VIAL GLASS SM (MISCELLANEOUS) ×1 IMPLANT
DERMABOND ADVANCED (GAUZE/BANDAGES/DRESSINGS) ×1
DERMABOND ADVANCED .7 DNX12 (GAUZE/BANDAGES/DRESSINGS) IMPLANT
DRAPE C-ARM 42X72 X-RAY (DRAPES) ×2 IMPLANT
DRAPE LAPAROTOMY TRNSV 102X78 (DRAPE) ×2 IMPLANT
DRAPE UTILITY XL STRL (DRAPES) ×2 IMPLANT
ELECT REM PT RETURN 9FT ADLT (ELECTROSURGICAL) ×2
ELECTRODE REM PT RTRN 9FT ADLT (ELECTROSURGICAL) ×1 IMPLANT
GAUZE SPONGE 4X4 12PLY STRL LF (GAUZE/BANDAGES/DRESSINGS) ×1 IMPLANT
GLOVE ECLIPSE 6.5 STRL STRAW (GLOVE) ×1 IMPLANT
GLOVE EUDERMIC 7 POWDERFREE (GLOVE) ×2 IMPLANT
GOWN PREVENTION PLUS XLARGE (GOWN DISPOSABLE) ×4 IMPLANT
IV CATH PLACEMENT UNIT 16 GA (IV SOLUTION) IMPLANT
IV HEPARIN 1000UNITS/500ML (IV SOLUTION) IMPLANT
IV KIT MINILOC 20X1 SAFETY (NEEDLE) IMPLANT
KIT BARDPORT ISP (Port) IMPLANT
KIT PORT POWER ISP 8FR (Catheter) IMPLANT
KIT POWER CATH 8FR (Catheter) ×1 IMPLANT
KIT POWER PORT SLIM 6FR (PORTABLE EQUIPMENT SUPPLIES) IMPLANT
NDL HYPO 25X1 1.5 SAFETY (NEEDLE) ×1 IMPLANT
NEEDLE HYPO 25X1 1.5 SAFETY (NEEDLE) ×2 IMPLANT
PACK BASIN DAY SURGERY FS (CUSTOM PROCEDURE TRAY) ×2 IMPLANT
PENCIL BUTTON HOLSTER BLD 10FT (ELECTRODE) ×2 IMPLANT
SET SHEATH INTRODUCER 10FR (MISCELLANEOUS) IMPLANT
SHEATH COOK PEEL AWAY SET 9F (SHEATH) IMPLANT
SHEET MEDIUM DRAPE 40X70 STRL (DRAPES) IMPLANT
SLEEVE SCD COMPRESS KNEE MED (MISCELLANEOUS) ×2 IMPLANT
SLEEVE SURGEON STRL (DRAPES) ×1 IMPLANT
STAPLER VISISTAT (STAPLE) IMPLANT
STRIP CLOSURE SKIN 1/2X4 (GAUZE/BANDAGES/DRESSINGS) IMPLANT
SUT MNCRL AB 4-0 PS2 18 (SUTURE) ×2 IMPLANT
SUT PROLENE 2 0 SH DA (SUTURE) ×2 IMPLANT
SUT VICRYL 3-0 CR8 SH (SUTURE) ×2 IMPLANT
SYR 5ML LUER SLIP (SYRINGE) ×2 IMPLANT
SYR CONTROL 10ML LL (SYRINGE) ×2 IMPLANT
TAPE CLOTH SURG 4X10 WHT LF (GAUZE/BANDAGES/DRESSINGS) ×1 IMPLANT
TOWEL OR 17X24 6PK STRL BLUE (TOWEL DISPOSABLE) ×3 IMPLANT
TOWEL OR NON WOVEN STRL DISP B (DISPOSABLE) ×2 IMPLANT
TUBE CONNECTING 20X1/4 (TUBING) IMPLANT
WATER STERILE IRR 1000ML POUR (IV SOLUTION) ×1 IMPLANT
YANKAUER SUCT BULB TIP NO VENT (SUCTIONS) IMPLANT

## 2011-04-13 NOTE — Op Note (Signed)
SHATONA ANDUJAR 24-Aug-1940 [id number] 04/01/2011  Preoperative diagnosis: PAC needed  Postoperative diagnosis: Same  Procedure: Portacath Placement  Surgeon: Currie Paris, MD, FACS  Anesthesia: General  Clinical History and Indications: The patient is getting ready to begin chemotherapy for her cancer. She  needs a Port-A-Cath for venous access.  Description of Procedure: I have seen the patient in the holding area and confirmed the plans for the procedure as noted above. I reviewed the risks and complications again and the patient has no further questions.  The patient was then taken to the operating room. After satisfactory general anesthesia had been obtained the upper chest and lower neck were prepped and draped as a sterile field. The timeout was done.  The right subclavian vein was entered and the guidewire threaded into the superior vena cava right atrial area under fluoroscopic guidance. Initially it went into the IJ but was repositioned with fluoro An incision was then made on the anterior chest wall and a subcutaneous pocket fashioned for the port reservoir.  The port tubing was then brought through a subcutaneous tunnel from the port site to the guidewire site. The dilator and peel-away sheath were then advanced over the guidewire while monitoring this with fluoroscopy. The guidewire and dilator were removed and the tubing threaded to approximately 22 cm. The peel-away sheath was then removed. The catheter aspirated and flushed easily. Using fluoroscopy the tip was backed out into the superior vena cava right atrial junction area. It aspirated and flushed easily. It was at 15.5 cm. The reservoir was attached and the locking mechanism engaged. That aspirated and flushed easily.  The reservoir was secured to the fascia with 2 sutures of 2-0 Prolene. A final check with fluoroscopy was done to make sure we had no kinks and good positioning of the tip of the catheter.  Everything appeared to be okay. The catheter was aspirated, flushed with dilute heparin and then concentrated aqueous heparin.  The incision was then closed with interrupted 3-0 Vicryl, and 4-0 Monocryl subcuticular with Dermabond on the skin.  There were no operative complications. Estimated blood loss was minimal. All counts were correct. The patient tolerated the procedure well.  Her Harrison Mons drain was also removed as it has slowed. This was done after induction as the patient wanted to wait until she was asleep to have it removed  Currie Paris, MD, FACS 04/13/2011 9:04 AM

## 2011-04-13 NOTE — Discharge Instructions (Signed)
Marland KitchenportImplanted Port Instructions An implanted port is a central line that has a round shape and is placed under the skin. It is used for long-term IV (intravenous) access for:  Medicine.   Fluids.   Liquid nutrition, such as TPN (total parenteral nutrition).   Blood samples.  Ports can be placed:  In the chest area just below the collarbone (this is the most common place.)   In the arms.   In the belly (abdomen) area.   In the legs.  PARTS OF THE PORT A port has 2 main parts:  The reservoir. The reservoir is round, disc-shaped, and will be a small, raised area under your skin.   The reservoir is the part where a needle is inserted (accessed) to either give medicines or to draw blood.   The catheter. The catheter is a long, slender tube that extends from the reservoir. The catheter is placed into a large vein.   Medicine that is inserted into the reservoir goes into the catheter and then into the vein.  INSERTION OF THE PORT  The port is surgically placed in either an operating room or in a procedural area (interventional radiology).   Medicine may be given to help you relax during the procedure.   The skin where the port will be inserted is numbed (local anesthetic).   1 or 2 small cuts (incisions) will be made in the skin to insert the port.   The port can be used after it has been inserted.  INCISION SITE CARE  The incision site may have small adhesive strips on it. This helps keep the incision site closed. Sometimes, no adhesive strips are placed. Instead of adhesive strips, a special kind of surgical glue is used to keep the incision closed.   If adhesive strips were placed on the incision sites, do not take them off. They will fall off on their own.   The incision site may be sore for 1 to 2 days. Pain medicine can help.   Do not get the incision site wet. Bathe or shower as directed by your caregiver.   The incision site should heal in 5 to 7 days. A small scar  may form after the incision has healed.  ACCESSING THE PORT Special steps must be taken to access the port:  Before the port is accessed, a numbing cream can be placed on the skin. This helps numb the skin over the port site.   A sterile technique is used to access the port.   The port is accessed with a needle. Only "non-coring" port needles should be used to access the port. Once the port is accessed, a blood return should be checked. This helps ensure the port is in the vein and is not clogged (clotted).   If your caregiver believes your port should remain accessed, a clear (transparent) bandage will be placed over the needle site. The bandage and needle will need to be changed every week or as directed by your caregiver.   Keep the bandage covering the needle clean and dry. Do not get it wet. Follow your caregiver's instructions on how to take a shower or bath when the port is accessed.   If your port does not need to stay accessed, no bandage is needed over the port.  FLUSHING THE PORT Flushing the port keeps it from getting clogged. How often the port is flushed depends on:  If a constant infusion is running. If a constant infusion is running, the  port may not need to be flushed.   If intermittent medicines are given.   If the port is not being used.  For intermittent medicines:  The port will need to be flushed:   After medicines have been given.   After blood has been drawn.   As part of routine maintenance.   A port is normally flushed with:   Normal saline.   Heparin.   Follow your caregiver's advice on how often, how much, and the type of flush to use on your port.  IMPORTANT PORT INFORMATION  Tell your caregiver if you are allergic to heparin.   After your port is placed, you will get a manufacturer's information card. The card has information about your port. Keep this card with you at all times.   There are many types of ports available. Know what kind of  port you have.   In case of an emergency, it may be helpful to wear a medical alert bracelet. This can help alert health care workers that you have a port.   The port can stay in for as long as your caregiver believes it is necessary.   When it is time for the port to come out, surgery will be done to remove it. The surgery will be similar to how the port was put in.   If you are in the hospital or clinic:   Your port will be taken care of and flushed by a nurse.   If you are at home:   A home health care nurse may give medicines and take care of the port.   You or a family member can get special training and directions for giving medicine and taking care of the port at home.  SEEK IMMEDIATE MEDICAL CARE IF:   Your port does not flush or you are unable to get a blood return.   New drainage or pus is coming from the incision.   A bad smell is coming from the incision site.   You develop swelling or increased redness at the incision site.   You develop increased swelling or pain at the port site.   You develop swelling or pain in the surrounding skin near the port.   You have an oral temperature above 102 F (38.9 C), not controlled by medicine.  MAKE SURE YOU:   Understand these instructions.   Will watch your condition.   Will get help right away if you are not doing well or get worse.  Document Released: 01/19/2005 Document Revised: 01/08/2011 Document Reviewed: 04/12/2008 [facility] Patient Information 2012 [place], [place].  [facility]  [address] [place], [place] [address] [phone]   Post Anesthesia Home Care Instructions  Activity: Get plenty of rest for the remainder of the day. A responsible adult should stay with you for 24 hours following the procedure.  For the next 24 hours, DO NOT: -Drive a car -Advertising copywriter -Drink alcoholic beverages -Take any medication unless instructed by your physician -Make any legal decisions  or sign important papers.  Meals: Start with liquid foods such as gelatin or soup. Progress to regular foods as tolerated. Avoid greasy, spicy, heavy foods. If nausea and/or vomiting occur, drink only clear liquids until the nausea and/or vomiting subsides. Call your physician if vomiting continues.  Special Instructions/Symptoms: Your throat may feel dry or sore from the anesthesia or the breathing tube placed in your throat during surgery. If this causes discomfort, gargle with warm salt water.  The discomfort should disappear within 24 hours.

## 2011-04-13 NOTE — Anesthesia Postprocedure Evaluation (Signed)
  Anesthesia Post-op Note  Patient: [name]  Procedure(s) Performed: Procedure(s) (LRB): INSERTION PORT-A-CATH (N/A)  Patient Location: PACU  Anesthesia Type: General  Level of Consciousness: awake, alert  and oriented  Airway and Oxygen Therapy: Patient Spontanous Breathing  Post-op Pain: none  Post-op Assessment: Post-op Vital signs reviewed, Patient's Cardiovascular Status Stable, Respiratory Function Stable, Patent Airway, No signs of Nausea or vomiting, Adequate PO intake and Pain level controlled  Post-op Vital Signs: Reviewed and stable  Complications: No apparent anesthesia complications

## 2011-04-13 NOTE — Anesthesia Preprocedure Evaluation (Signed)
Anesthesia Evaluation  Patient identified by MRN, date of birth, ID band Patient awake    Reviewed: Allergy & Precautions, H&P , NPO status , Patient's Chart, lab work & pertinent test results  History of Anesthesia Complications Negative for: history of anesthetic complications  Airway Mallampati: II TM Distance: >3 FB Neck ROM: Full    Dental  (+) Caps, Chipped, Implants and Dental Advisory Given   Pulmonary neg pulmonary ROS,  breath sounds clear to auscultation  Pulmonary exam normal       Cardiovascular hypertension, Pt. on medications Rhythm:Regular Rate:Normal  3/13 MUGA: normal LVF   Neuro/Psych Anxiety negative neurological ROS     GI/Hepatic Neg liver ROS, GERD-  Medicated and Controlled,  Endo/Other  Diabetes mellitus-, Well Controlled, Type 2, Oral Hypoglycemic Agents  Renal/GU negative Renal ROS     Musculoskeletal negative musculoskeletal ROS (+)   Abdominal (+) + obese,   Peds  Hematology Chemo   Anesthesia Other Findings   Reproductive/Obstetrics                           Anesthesia Physical Anesthesia Plan  ASA: III  Anesthesia Plan: General   Post-op Pain Management:    Induction: Intravenous  Airway Management Planned: LMA  Additional Equipment:   Intra-op Plan:   Post-operative Plan:   Informed Consent: I have reviewed the patients History and Physical, chart, labs and discussed the procedure including the risks, benefits and alternatives for the proposed anesthesia with the patient or authorized representative who has indicated his/her understanding and acceptance.   Dental advisory given  Plan Discussed with: CRNA and Surgeon  Anesthesia Plan Comments: (Plan routine monitors, GA- LMA OK)        Anesthesia Quick Evaluation

## 2011-04-13 NOTE — Anesthesia Procedure Notes (Signed)
Procedure Name: LMA Insertion Date/Time: 04/13/2011 8:27 AM Performed by: Lukus Binion D Pre-anesthesia Checklist: Patient identified, Emergency Drugs available, Suction available and Patient being monitored Patient Re-evaluated:Patient Re-evaluated prior to inductionOxygen Delivery Method: Circle System Utilized Preoxygenation: Pre-oxygenation with 100% oxygen Intubation Type: IV induction Ventilation: Mask ventilation without difficulty LMA: LMA inserted LMA Size: 4.0 Number of attempts: 1 Placement Confirmation: positive ETCO2 Tube secured with: Tape Dental Injury: Teeth and Oropharynx as per pre-operative assessment

## 2011-04-13 NOTE — Transfer of Care (Signed)
Immediate Anesthesia Transfer of Care Note  Patient: [name]  Procedure(s) Performed: Procedure(s) (LRB): INSERTION PORT-A-CATH (N/A)  Patient Location: PACU  Anesthesia Type: General  Level of Consciousness: awake, alert , oriented and patient cooperative  Airway & Oxygen Therapy: Patient Spontanous Breathing and Patient connected to face mask oxygen  Post-op Assessment: Report given to PACU RN and Post -op Vital signs reviewed and stable  Post vital signs: Reviewed and stable  Complications: No apparent anesthesia complications

## 2011-04-13 NOTE — Interval H&P Note (Signed)
History and Physical Interval Note:  04/13/2011 8:13 AM  [name]  has presented today for surgery, with the diagnosis of left breast cancer  The various methods of treatment have been discussed with the patient and family. After consideration of risks, benefits and other options for treatment, the patient has consented to  Procedure(s) (LRB): INSERTION PORT-A-CATH (N/A) as a surgical intervention .  The patients' history has been reviewed, patient examined, no change in status, stable for surgery.  I have reviewed the patients' chart and labs.  Questions were answered to the patient's satisfaction.    She is now s/p mastectomy. She anticipates starting chemo in five days. Her drain on the mastectomy side has slowed and can be removed today   [name]

## 2011-04-13 NOTE — H&P (View-Only) (Signed)
NAME: [name]                                                                                      DOB: 20-Mar-1940 DATE: 03/04/2011               MRN: [id number]   CC: Breast Cancer  HPI:  [name] is a 71 y.o.  female who was  Recently diagnosed with a left breast cancer lower outer quadrant, invasive lobular. This presented as a palpable mass. She had not had a mammogram for several years. After biopsy confirmed the diagnosis she was seen by medical oncology and elected to proceed to a mastectomy and sentinel node evaluation. She is admitted for that.  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  PSH:   has past surgical history that includes Laminectomy (4098); bladder tack (1974); Cholecystectomy (02/23/1991); Tonsillectomy; Rotator cuff repair (8/03); Enterocele repair (06/08); Rotator cuff repair (10/26/2008); Hip surgery (08/27/2010); Breast excisional biopsy (11/10/1988); Breast excisional biopsy (03/04/1983); Breast excisional biopsy (10/09/1980); Breast excisional biopsy (10/18/1979); Breast excisional biopsy (01/02/1994); Vaginal hysterectomy (1974); and Colonoscopy.  ALLERGIES:   Allergies  Allergen Reactions  . Ceftriaxone Sodium     REACTION: hives  . Cephalexin     REACTION: hives  . Clindamycin     REACTION: hives  . Oxycodone-Acetaminophen     REACTION: hives  . Rocephin (Ceftriaxone Sodium In Dextrose) Hives  . Sulfonamide Derivatives     REACTION: hives    MEDICATIONS: Current facility-administered medications:acetaminophen (OFIRMEV) IV 1,000 mg, 1,000 mg,  Intravenous, Once, Constance Goltz, MD, 1,000 mg at 03/23/11 1191;  chlorhexidine (HIBICLENS) 4 % liquid 1 application, 1 application, Topical, Once, Currie Paris, MD;  chlorhexidine (HIBICLENS) 4 % liquid 1 application, 1 application, Topical, Once, Currie Paris, MD ciprofloxacin (CIPRO) IVPB 400 mg, 400 mg, Intravenous, 120 min pre-op, Currie Paris, MD;  fentaNYL (SUBLIMAZE) injection 50 mcg, 50 mcg, Intravenous, PRN, Constance Goltz, MD, 50 mcg at 03/23/11 4782;  lactated ringers infusion, , Intravenous, Continuous, W. Autumn Patty, MD, Last Rate: 20 mL/hr at 03/23/11 0909 midazolam (VERSED) injection 0.5-2 mg, 0.5-2 mg, Intravenous, PRN, Constance Goltz, MD, 2 mg at 03/23/11 9562  ROS: No change from office notes of one month ago  EXAM:   GENERAL:  The patient is alert, oriented, and generally healthy-appearing, NAD. Mood and affect are normal.  HEENT:  The head is normocephalic, the eyes nonicteric, the pupils were round regular and equal. EOMs are normal. Pharynx normal. Dentition good.  NECK:  The neck is supple and there are no masses or thyromegaly.  LUNGS: Normal respirations and clear to auscultation.  HEART: Regular rhythm, with no murmurs rubs or gallops. Pulses are intact carotid dorsalis pedis and posterior tibial. No significant varicosities are noted.  BREASTS:  The right breast is normal. There is a hard mass in the left breast  measuring 4-5 cm with some skin dimpling. This is in the lower outer quadrant. This is consistent with a carcinoma.  LYMPHATICS: There is no palpable axillary or suprapubic adenopathy on either side.  ABDOMEN: Soft, flat, and nontender. No masses or organomegaly is noted. No hernias are noted. Bowel sounds are normal.  EXTREMITIES:  Good range of motion, no edema.   DATA REVIEWED:  I have reviewed Her x-rays pathology report and consultation with the oncologist.  IMPRESSION:  Clinical  stage II left breast cancer lower outer quadrant  PLAN:   Left mastectomy with sentinel node evaluation. I have discussed situation with the patient and her family, reviewed options et Karie Soda. All questions have been answered.  Meryle Pugmire J 03/23/2011  CC: No ref. provider found, Michele Mcalpine, MD, MD

## 2011-04-15 ENCOUNTER — Encounter (HOSPITAL_BASED_OUTPATIENT_CLINIC_OR_DEPARTMENT_OTHER): Payer: Self-pay | Admitting: Surgery

## 2011-04-16 ENCOUNTER — Telehealth: Payer: Self-pay | Admitting: *Deleted

## 2011-04-16 NOTE — Telephone Encounter (Signed)
Per MD, notified pt pet scan without evidence metastatic cancer. Pt and Rn reviewed she is allowed to eat breakfast prior to chemo. Pt has concerns about " puffiness on my sides. The Dr. Rochele Pages those drains out last weeK" Discussed pt to see MD tomorrow who will examine the area and address her concerns. Pt denied any pain/redness to are. Pt  verbalized understanding & confirmed appt on 04/17/11

## 2011-04-17 ENCOUNTER — Other Ambulatory Visit (HOSPITAL_BASED_OUTPATIENT_CLINIC_OR_DEPARTMENT_OTHER): Payer: Medicare Other

## 2011-04-17 ENCOUNTER — Encounter: Payer: Self-pay | Admitting: Oncology

## 2011-04-17 ENCOUNTER — Ambulatory Visit (HOSPITAL_BASED_OUTPATIENT_CLINIC_OR_DEPARTMENT_OTHER): Payer: Medicare Other

## 2011-04-17 ENCOUNTER — Ambulatory Visit (HOSPITAL_BASED_OUTPATIENT_CLINIC_OR_DEPARTMENT_OTHER): Payer: Medicare Other | Admitting: Oncology

## 2011-04-17 VITALS — BP 156/71 | HR 72 | Temp 98.2°F | Ht 68.0 in | Wt 194.4 lb

## 2011-04-17 DIAGNOSIS — C773 Secondary and unspecified malignant neoplasm of axilla and upper limb lymph nodes: Secondary | ICD-10-CM

## 2011-04-17 DIAGNOSIS — Z170 Estrogen receptor positive status [ER+]: Secondary | ICD-10-CM

## 2011-04-17 DIAGNOSIS — C50319 Malignant neoplasm of lower-inner quadrant of unspecified female breast: Secondary | ICD-10-CM

## 2011-04-17 DIAGNOSIS — F411 Generalized anxiety disorder: Secondary | ICD-10-CM

## 2011-04-17 DIAGNOSIS — Z5111 Encounter for antineoplastic chemotherapy: Secondary | ICD-10-CM

## 2011-04-17 LAB — COMPREHENSIVE METABOLIC PANEL
AST: 19 U/L (ref 0–37)
Albumin: 3.6 g/dL (ref 3.5–5.2)
Alkaline Phosphatase: 112 U/L (ref 39–117)
BUN: 15 mg/dL (ref 6–23)
Calcium: 10 mg/dL (ref 8.4–10.5)
Chloride: 98 mEq/L (ref 96–112)
Glucose, Bld: 150 mg/dL — ABNORMAL HIGH (ref 70–99)
Potassium: 4.3 mEq/L (ref 3.5–5.3)
Sodium: 138 mEq/L (ref 135–145)
Total Protein: 7 g/dL (ref 6.0–8.3)

## 2011-04-17 LAB — CBC WITH DIFFERENTIAL/PLATELET
Basophils Absolute: 0.1 10*3/uL (ref 0.0–0.1)
EOS%: 2.9 % (ref 0.0–7.0)
Eosinophils Absolute: 0.2 10*3/uL (ref 0.0–0.5)
HGB: 14.3 g/dL (ref 11.6–15.9)
MONO%: 8.7 % (ref 0.0–14.0)
NEUT#: 4.1 10*3/uL (ref 1.5–6.5)
RBC: 4.95 10*6/uL (ref 3.70–5.45)
RDW: 14.6 % — ABNORMAL HIGH (ref 11.2–14.5)
lymph#: 2.3 10*3/uL (ref 0.9–3.3)

## 2011-04-17 MED ORDER — DEXAMETHASONE SODIUM PHOSPHATE 4 MG/ML IJ SOLN
12.0000 mg | Freq: Once | INTRAMUSCULAR | Status: AC
  Administered 2011-04-17: 12 mg via INTRAVENOUS

## 2011-04-17 MED ORDER — FLUOROURACIL CHEMO INJECTION 2.5 GM/50ML
500.0000 mg/m2 | Freq: Once | INTRAVENOUS | Status: AC
  Administered 2011-04-17: 1050 mg via INTRAVENOUS
  Filled 2011-04-17: qty 21

## 2011-04-17 MED ORDER — SODIUM CHLORIDE 0.9 % IV SOLN
500.0000 mg/m2 | Freq: Once | INTRAVENOUS | Status: AC
  Administered 2011-04-17: 1040 mg via INTRAVENOUS
  Filled 2011-04-17: qty 52

## 2011-04-17 MED ORDER — LORAZEPAM 2 MG/ML IJ SOLN
0.5000 mg | Freq: Once | INTRAMUSCULAR | Status: AC
  Administered 2011-04-17: 0.5 mg via INTRAVENOUS

## 2011-04-17 MED ORDER — PALONOSETRON HCL INJECTION 0.25 MG/5ML
0.2500 mg | Freq: Once | INTRAVENOUS | Status: AC
  Administered 2011-04-17: 0.25 mg via INTRAVENOUS

## 2011-04-17 MED ORDER — SODIUM CHLORIDE 0.9 % IV SOLN
150.0000 mg | Freq: Once | INTRAVENOUS | Status: AC
  Administered 2011-04-17: 150 mg via INTRAVENOUS
  Filled 2011-04-17: qty 5

## 2011-04-17 MED ORDER — LORAZEPAM 2 MG/ML IJ SOLN: 0.5000 mg | Freq: Once | INTRAMUSCULAR | Status: DC

## 2011-04-17 MED ORDER — HEPARIN SOD (PORK) LOCK FLUSH 100 UNIT/ML IV SOLN
500.0000 [IU] | Freq: Once | INTRAVENOUS | Status: DC | PRN
  Filled 2011-04-17: qty 5

## 2011-04-17 MED ORDER — EPIRUBICIN HCL CHEMO IV INJECTION 200 MG/100ML
100.0000 mg/m2 | Freq: Once | INTRAVENOUS | Status: AC
  Administered 2011-04-17: 208 mg via INTRAVENOUS
  Filled 2011-04-17: qty 104

## 2011-04-17 MED ORDER — SODIUM CHLORIDE 0.9 % IJ SOLN
10.0000 mL | INTRAMUSCULAR | Status: DC | PRN
  Filled 2011-04-17: qty 10

## 2011-04-17 MED ORDER — SODIUM CHLORIDE 0.9 % IV SOLN: Freq: Once | INTRAVENOUS | Status: DC

## 2011-04-17 NOTE — Progress Notes (Signed)
Patient having increased anxiety and discomfort with Port access; port placed 04/14/11, site still very swollen and painful; patient crying and yelling out in pain;okay to get peripheral IV access per Dr. Welton Flakes and order for Ativan 0.5mg  IV for anxiety given; educated patient on using EMLA cream on port site prior to next infusion appointment.

## 2011-04-17 NOTE — Patient Instructions (Addendum)
[facility] Discharge Instructions for Patients Receiving Chemotherapy  Today you received the following chemotherapy agents 5-FU, Cytoxan, Ellence  To help prevent nausea and vomiting after your treatment, we encourage you to take your nausea medication       If you develop nausea and vomiting that is not controlled by your nausea medication, call the clinic. If it is after clinic hours your family physician or the after hours number for the clinic or go to the Emergency Department.   BELOW ARE SYMPTOMS THAT SHOULD BE REPORTED IMMEDIATELY:  *FEVER GREATER THAN 100.5 F  *CHILLS WITH OR WITHOUT FEVER  NAUSEA AND VOMITING THAT IS NOT CONTROLLED WITH YOUR NAUSEA MEDICATION  *UNUSUAL SHORTNESS OF BREATH  *UNUSUAL BRUISING OR BLEEDING  TENDERNESS IN MOUTH AND THROAT WITH OR WITHOUT PRESENCE OF ULCERS  *URINARY PROBLEMS  *BOWEL PROBLEMS  UNUSUAL RASH Items with * indicate a potential emergency and should be followed up as soon as possible.  One of the nurses will contact you 24 hours after your treatment. Please let the nurse know about any problems that you may have experienced. Feel free to call the clinic you have any questions or concerns. The clinic phone number is [phone].   I have been informed and understand all the instructions given to me. I know to contact the clinic, my physician, or go to the Emergency Department if any problems should occur. I do not have any questions at this time, but understand that I may call the clinic during office hours   should I have any questions or need assistance in obtaining follow up care.    __________________________________________  _____________  __________ Signature of Patient or Authorized Representative            Date                   Time    __________________________________________ Nurse's Signature   Patient discharged home with no complaints.

## 2011-04-17 NOTE — Patient Instructions (Signed)
1. You received FEC cycle #1  2. Begin your anti-nausea medications as follows:      1. Compazine (pill) 10 mg every 6 hours beginning 6 hours after your chemotherapy for 3 days then as needed   2. Dexamethsone (steroid) 2 pills every 12 hours  beginning on 3/16 x 2 days   3. Zofran (odansetron) 1 pill every 12 hours beginning Monday 3/18 for 3 days then as needed.  3. You will come back on 3/16 for neulasta injection, please take some tylenol and zyrtec 3 hours before you come in for your injection so we may avoid aches and pains you may expereince  4. Ativan take as needed for anxiety or if you cannot sleep at night.  5. Save the suppository if you are not able to take your oral anti-nausea medications.  6. Call if you have any uncontrolled nausea or vomiting in spite of your medications. PHONE: [phone] and ask for the doctor on call.

## 2011-04-18 ENCOUNTER — Ambulatory Visit: Payer: Medicare Other

## 2011-04-18 ENCOUNTER — Ambulatory Visit (HOSPITAL_BASED_OUTPATIENT_CLINIC_OR_DEPARTMENT_OTHER): Payer: Medicare Other

## 2011-04-18 VITALS — BP 132/73 | HR 71 | Temp 98.5°F

## 2011-04-18 DIAGNOSIS — Z5189 Encounter for other specified aftercare: Secondary | ICD-10-CM

## 2011-04-18 DIAGNOSIS — C50319 Malignant neoplasm of lower-inner quadrant of unspecified female breast: Secondary | ICD-10-CM

## 2011-04-18 MED ORDER — PEGFILGRASTIM INJECTION 6 MG/0.6ML
6.0000 mg | Freq: Once | SUBCUTANEOUS | Status: AC
Start: 2011-04-18 — End: 2011-04-18
  Administered 2011-04-18: 6 mg via SUBCUTANEOUS

## 2011-04-20 ENCOUNTER — Other Ambulatory Visit: Payer: Self-pay | Admitting: Certified Registered Nurse Anesthetist

## 2011-04-20 ENCOUNTER — Telehealth (INDEPENDENT_AMBULATORY_CARE_PROVIDER_SITE_OTHER): Payer: Self-pay | Admitting: General Surgery

## 2011-04-20 NOTE — Telephone Encounter (Signed)
Received refill request for pain meds.  Chart review showed okay to refill once per protocol.  Phoned refill to [facility] on [address], to the answering machine.

## 2011-04-20 NOTE — Progress Notes (Signed)
OFFICE PROGRESS NOTE  CC  Michele Mcalpine, MD, MD [facility] [address] [place] [address] [place] [address] Dr. Calton Dach Dr. Chipper Herb Dr. Romero Belling  DIAGNOSIS: 71 year old female with stage III a invasive lobular carcinoma of the left breast status post mastectomy with left axillary lymph node dissection performed on 03/23/2011.  PRIOR THERAPY:  #1 patient was originally seen by me on 03/05/2011 for a invasive lobular carcinoma of the left breast the tumor was 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 Her clinical staging was stage IIB (T3, N0, M0).  #2 the patient has gone on to have a left breast mastectomy with axillary lymph node dissection on 03/23/2011. The final pathology revealed a 6.0 cm invasive lobular carcinoma with lymphovascular invasion and perineural invasion. Invasive tumor was 2.9 cm from the nearest margin lobular carcinoma in situ was noted. Initially 6 lymph nodes were positive for metastatic carcinoma. Tumor deposits were 0.5 0.8 0.7 0.7 0.3 and 1.0 cm focal extracapsular tumor extension was noted. Tumor was grade 3. The final lymph node count was 16 9 of which were positive for metastatic disease. Tumor was estrogen receptor +100% progesterone receptor +100% HER-2/neu negative with a ratio 1.31 proliferation marker 18% final pathologic staging was p T3a pN2a, Stage IIIa   #3. Begin adjuvant Chemotherapy with [facility] 04/17/11  CURRENT THERAPY:Here for adjuvant chemotherapy to begin FEC 100 day1 cycle 1  INTERVAL HISTORY: TENITA CUE 71 y.o. female returns for Followup visit prior to her chemotherapy. She is very nervous today.She had all of her medications filled including anti-emetics. We went over this again and I have put them in the patient instructuions. She is having pain at the port a cath site but there is no redness or bleeding. She denies having any nausea or vomitting. No fevers or chills. Her  primary concern are the side effects of N/V/D. She is re-assured and given full written instructons. Remainder of the 10 point  review of systems is negative.  MEDICAL HISTORY: Past Medical History  Diagnosis Date  . DIABETES MELLITUS 08/10/2007  . 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 HYPERCHOLESTEROLEMIA 12/17/2006  . OBESITY 06/04/2008  . ANXIETY 06/04/2008  . DEPRESSION 08/10/2007  . HYPERTENSION 12/17/2006  . ASTHMATIC BRONCHITIS, ACUTE 06/04/2008  . ALLERGIC RHINITIS 12/13/2007  . GERD 12/17/2006  . DEGENERATIVE JOINT DISEASE 12/17/2006  . Asthma   . Blood transfusion 1964    post childbirth  . 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  MEDICATIONS:  Current Outpatient Prescriptions  Medication Sig Dispense Refill  . albuterol (PROVENTIL) (2.5 MG/3ML) 0.083% nebulizer solution 2.5 mg every 4 (four) hours as needed. As needed for shortness of breath.      . ALPRAZolam (XANAX) 0.5 MG tablet 1/2-1 tablet three times a day as needed for nerves  90 tablet  5  . amLODipine (NORVASC) 10 MG tablet Take 1 tablet (10 mg total) by mouth daily.  30 tablet  11  . atorvastatin (LIPITOR) 40 MG tablet Take 1 tablet (40 mg total) by mouth daily.  30 tablet  11  . benazepril (LOTENSIN) 40 MG tablet Take 1 tablet (40 mg total) by mouth daily.  30 tablet  11  . Cholecalciferol (VITAMIN D) 2000 UNITS tablet Take 2,000 Units by mouth daily.        Marland Kitchen dexamethasone (DECADRON) 4  MG tablet Take 2 tablets by mouth once a day on the day after chemotherapy and then take 2 tablets two times a day for 2 days. Take with food.  30 tablet  1  . hydrochlorothiazide 25 MG tablet 1/2 to 1 tablet by mouth once a day as needed for swelling       . HYDROcodone-acetaminophen (NORCO) 5-325 MG per tablet Take 1 tablet by mouth every 6 (six) hours as needed.  30 tablet  0  .  lidocaine-prilocaine (EMLA) cream Apply topically as needed.  30 g  3  . LORazepam (ATIVAN) 0.5 MG tablet Take 1 tablet (0.5 mg total) by mouth every 6 (six) hours as needed (Nausea or vomiting).  30 tablet  0  . metFORMIN (GLUCOPHAGE) 500 MG tablet Take 1 tablet (500 mg total) by mouth 2 (two) times daily with a meal.  60 tablet  5  . naproxen (NAPROSYN) 250 MG tablet Take 250 mg by mouth daily as needed. As needed for bursitis pain in hip       . omeprazole (PRILOSEC) 20 MG capsule Take 1 capsule (20 mg total) by mouth daily.  30 capsule  11  . ondansetron (ZOFRAN) 8 MG tablet Take 1 tablet two times a day as needed for nausea or vomiting starting on the third day after chemotherapy.  30 tablet  1  . potassium chloride SA (K-DUR,KLOR-CON) 20 MEQ tablet Take 20 mEq by mouth daily as needed. (take with the HCTZ)       . prochlorperazine (COMPAZINE) 10 MG tablet Take 1 tablet (10 mg total) by mouth every 6 (six) hours as needed (Nausea or vomiting).  30 tablet  1  . prochlorperazine (COMPAZINE) 25 MG suppository Place 1 suppository (25 mg total) rectally every 12 (twelve) hours as needed for nausea.  12 suppository  3  . sertraline (ZOLOFT) 50 MG tablet Take 1 tablet (50 mg total) by mouth daily.  30 tablet  5  . sitaGLIPtin (JANUVIA) 50 MG tablet Take 50 mg by mouth daily.       Current Facility-Administered Medications  Medication Dose Route Frequency Provider Last Rate Last Dose  . LORazepam (ATIVAN) injection 0.5 mg  0.5 mg Intravenous Once Victorino December, MD        SURGICAL HISTORY:  Past Surgical History  Procedure Date  . Laminectomy [id number]    s/p-Dr. Jule Ser  . Bladder tack 1974  . Cholecystectomy 02/23/1991    [facility] - Dr Jamey Ripa  . Tonsillectomy   . Rotator cuff repair 8/03    right, Dr. Shelle Iron  . Enterocele repair 06/08    Dr. Estanislado Pandy  . Rotator cuff repair 10/26/2008    left, Dr. Rayburn Ma  . Hip surgery 08/27/2010    DR. Despina Hick  . Breast excisional biopsy 11/10/1988    left  benign Dr Jamey Ripa  . Breast excisional biopsy 03/04/1983    Right - two areas - Dr Jamey Ripa  . Breast excisional biopsy 10/09/1980    right - Dr Jamey Ripa  . Breast excisional biopsy 10/18/1979    left - Dr Jamey Ripa  . Breast excisional biopsy 01/02/1994    right - Dr Jamey Ripa  . Vaginal hysterectomy 1974    partial   . Colonoscopy   . Mastectomy     left  . Modified radical mastectomy w/ axillary lymph node dissection     03-30-11 LT  . Portacath placement 04/13/2011    Procedure: INSERTION PORT-A-CATH;  Surgeon: Currie Paris, MD;  Location:  [facility];  Service: General;  Laterality: N/A;  porta cath placement,removal of J-P drain    REVIEW OF SYSTEMS:  Pertinent items are noted in HPI.   PHYSICAL EXAMINATION: General appearance: alert, cooperative and appears stated age Neck: no adenopathy, no carotid bruit, no JVD, supple, symmetrical, trachea midline and thyroid not enlarged, symmetric, no tenderness/mass/nodules Lymph nodes: Cervical, supraclavicular, and axillary nodes normal. Resp: clear to auscultation bilaterally and normal percussion bilaterally Back: symmetric, no curvature. ROM normal. No CVA tenderness. Cardio: regular rate and rhythm, S1, S2 normal, no murmur, click, rub or gallop GI: soft, non-tender; bowel sounds normal; no masses,  no organomegaly Extremities: extremities normal, atraumatic, no cyanosis or edema Neurologic: Grossly normal Left mastectomy scar healing well. Port in place without any evidence of redness or infections.  ECOG PERFORMANCE STATUS: 1 - Symptomatic but completely ambulatory  Blood pressure 156/71, pulse 72, temperature 98.2 F (36.8 C), temperature source Oral, height 5\' 8"  (1.727 m), weight 194 lb 6.4 oz (88.179 kg).  LABORATORY DATA: Lab Results  Component Value Date   WBC 7.3 04/17/2011   HGB 14.3 04/17/2011   HCT 42.7 04/17/2011   MCV 86.3 04/17/2011   PLT 242 04/17/2011      Chemistry      Component Value Date/Time    NA 138 04/17/2011 1230   K 4.3 04/17/2011 1230   CL 98 04/17/2011 1230   CO2 28 04/17/2011 1230   BUN 15 04/17/2011 1230   CREATININE 0.64 04/17/2011 1230      Component Value Date/Time   CALCIUM 10.0 04/17/2011 1230   ALKPHOS 112 04/17/2011 1230   AST 19 04/17/2011 1230   ALT 22 04/17/2011 1230   BILITOT 0.5 04/17/2011 1230     ADDITIONAL INFORMATION: 4. CHROMOGENIC IN-SITU HYBRIDIZATION Interpretation HER-2/NEU BY CISH - NO AMPLIFICATION OF HER-2 DETECTED. THE RATIO OF HER-2: CEP 17 SIGNALS WAS 1.52. Reference range: Ratio: HER2:CEP17 < 1.8 - gene amplification not observed Ratio: HER2:CEP 17 1.8-2.2 - equivocal result Ratio: HER2:CEP17 > 2.2 - gene amplification observed Pecola Leisure MD Pathologist, Electronic Signature ( Signed 03/27/2011) FINAL DIAGNOSIS 1 of 4 FINAL for CRISSY, MCCREADIE ([id number]) Diagnosis 1. Lymph node, sentinel, biopsy, left axilla - ONE LYMPH NODE, POSITIVE FOR METASTATIC MAMMARY CARCINOMA (1/1). - TUMOR DEPOSIT SI 1.0 CM. - FOCAL EXTRACAPSULAR TUMOR EXTENSION PRESENT. 2. Lymph node, sentinel, biopsy, left axilla - ONE LYMPH NODE, POSITIVE FOR METASTATIC MAMMARY CARCINOMA (1/1). - TUMOR DEPOSIT SI 1.8 CM. - FOCAL EXTRACAPSULAR TUMOR EXTENSION PRESENT. 3. Lymph node, sentinel, biopsy, left axilla - ONE LYMPH NODE, POSITIVE FOR METASTATIC MAMMARY CARCINOMA (1/1). - TUMOR DEPOSIT SI 0.5 CM. - FOCAL EXTRACAPSULAR TUMOR EXTENSION PRESENT. 4. Breast, modified radical mastectomy , , left breast - INVASIVE LOBULAR CARCINOMA (6.0 CM). - LYMPHOVASCULAR INVASION IDENTIFIED. - PERINEURAL INVASION IDENTIFIED. - INVASIVE TUMOR IS 2.9 CM FROM THE NEAREST MARGIN (DEEP). - LOBULAR CARCINOMA IN SITU. - SIX LYMPH NODES POSITIVE FOR METASTATIC MAMMARY CARCINOMA (6/13). - TUMOR DEPOSITS ARE 0.5 CM, 0.8 CM, 0.7 CM, 0.7 CM, 0.3 CM, AND 1.0 CM. - FOCAL EXTRACAPSULAR TUMOR EXTENSION PRESENT. - SEE TUMOR SYNOPTIC TEMPLATE BELOW. Microscopic Comment 4. BREAST,  INVASIVE TUMOR, WITHOUT LYMPH NODE SAMPLING Specimen, including laterality: Left breast Procedure: Radical mastectomy Grade: III of III Tubule formation: 3 Nuclear pleomorphism: 3 Mitotic: 2 Tumor size (gross measurement): 6.0 cm Margins: Invasive, distance to closest margin: 2.9 cm In-situ, distance to closest margin: 2.9 cm (deep) If margin positive, focally or broadly: N/A Lymphovascular invasion:  Present Ductal carcinoma in situ: None Extensive intraductal component: N/A Lobular neoplasia: Present (lobular carcinoma in situ and atypical lobular hyperplasia Tumor focality: Unifocal Treatment effect: None If present, treatment effect in breast tissue, lymph nodes or both: N/A Extent of tumor: Skin: grossly negative Nipple: grossly negative Skeletal muscle: N/A Lymph nodes, number examined: 16; number with metastasis: 9 Isolated tumor cells: None Micrometastasis: None Macrometastasis: 6 lymph nodes (tumor deposits: 0.5 cm, 0.8 cm, 0.7 cm, 0.7 cm, 0.3 cm, 1.0 cm, 1.0 cm, 1.8 cm, and 0.5 cm) Extracapsular extension: Focally present in multiple lymph nodes 2 of 4 FINAL for SHARALYN, LOMBA ([id number]) Microscopic Comment(continued) Breast prognostic profile: Estrogen receptor: Not repeated, previous study demonstrated 100% positivity ([id number]) Progesterone receptor: Not repeated, previous study demonstrated 100% positivity ([id number]) Her 2 neu: Repeated, previous study demonstrated no amplification (1.31) ([id number]) Ki-67: Not repeated, previous study demonstrated 18% proliferation rate. [phone]) Non-neoplastic breast: Benign fibrocystic change, dense parenchymal fibrosis, sclerosing adenosis, fibroadenomatoid nodules and microcalcifications in benign ducts and lobules. (CR:jy) 03/24/11 TNM: pT3 pN2a Comments: Cytokeratin AE1/3 immunostain was preformed on the lymph nodes that, by H&E, were negative for malignancy. There are no additional tumor deposits identified  in lymph nodes. [place] RUND DO Pathologist, Electronic Signature (Case signed 03/25/2011) Intraoperative Diagnosis PART 1: METASTATIC CARCINOMA. (JK) Specimen Gross and Clinical Information Specimen(s) Obtained: 1. Lymph node, sentinel, biopsy, left axilla 2. Lymph node, sentinel, biopsy, left axilla 3. Lymph node, sentinel, biopsy, left axilla 4. Breast, modified radical mastectomy , , left breast Specimen Clinical Information  RADIOGRAPHIC STUDIES:   ASSESSMENT: 71 year old female with  #1 invasive lobular carcinoma of the left breast status post mastectomy with axillary lymph node dissection. The final pathology revealed a T3 N2 way invasive lobular carcinoma. The tumor measured 6.0 cm 9 of 16 lymph nodes were positive for metastatic disease. Tumor was ER positive PR positive HER-2/neu negative proliferation marker 18%.  #2 Patient is here for post mastectomy adjuvant chemotherapy. She will intially receive FEC x 4 cycles, followed by single agent taxol weekly x 12 weeks.  #3 patient will also need postmastectomy radiation therapy  #4 Once patient completes chemotherapy and radiation therapy trhen i will have begin adjuvant anti-estrogen therapy with letrozole 2.5 mg daily. A total of 5 years of therapy is planned with this agent.   PLAN:   #1Proceed with cycle #1 day 1 of FEC today. She will return in 1 day for neulasta.  #2. Ativan was given to her to help reliever anxiety that patient is experiencing. Reassurance is given. Complete instructions are given to patient to how to take her anti-emetics.  #3 She will return in 1 week for interim labs and follow up visit.  All questions were answered. The patient knows to call the clinic with any problems, questions or concerns. We can certainly see the patient much sooner if necessary.  I spent 30 minutes counseling the patient face to face. The total time spent in the appointment was 30 minutes.    Drue Second,  MD Medical/Oncology [facility] [phone] (beeper) [phone] (Office)  04/20/2011, 6:54 AM

## 2011-04-21 ENCOUNTER — Encounter: Payer: Self-pay | Admitting: *Deleted

## 2011-04-21 ENCOUNTER — Telehealth: Payer: Self-pay | Admitting: *Deleted

## 2011-04-21 NOTE — Telephone Encounter (Signed)
DISCUSSED WITH PT. A BOWEL REGIMENT TO PREVENT CONSTIPATION. PT. STATES "SHE IS DEPRESSED". THERE IS NO SUICIDAL THOUGHTS. PT. IS VERY TEARFUL. NO OTHER PROBLEMS OR QUESTIONS AT THIS TIME. PT. SEES NANCY RUDOLPH,NP ON Friday. PT.'S PHARMACY IS [facility] PHONE [phone]. THIS NOTE TO DR.KHAN'S NURSE, VICTORIA MOORE,RN.

## 2011-04-21 NOTE — Progress Notes (Signed)
PER NR, OFFERED PT AN EARLIER APPT. TO DISCUSS DEPRESSION. PT STATES TAHT SHE HAS AN APPT WITH DR [facility] 04/22/11 AND WILL KEEP HER PREVIOUS APPT WITH NR ON Friday 04/24/11. PT KNOWS TO CALL IF SHE NEEDS TO SEE [place] SOONER

## 2011-04-22 ENCOUNTER — Ambulatory Visit (INDEPENDENT_AMBULATORY_CARE_PROVIDER_SITE_OTHER): Payer: Medicare Other | Admitting: Surgery

## 2011-04-22 ENCOUNTER — Encounter (INDEPENDENT_AMBULATORY_CARE_PROVIDER_SITE_OTHER): Payer: Self-pay | Admitting: Surgery

## 2011-04-22 VITALS — BP 130/82 | HR 84 | Temp 97.6°F | Resp 20 | Ht 68.0 in | Wt 195.0 lb

## 2011-04-22 DIAGNOSIS — Z9889 Other specified postprocedural states: Secondary | ICD-10-CM

## 2011-04-22 DIAGNOSIS — C50919 Malignant neoplasm of unspecified site of unspecified female breast: Secondary | ICD-10-CM

## 2011-04-22 NOTE — Progress Notes (Signed)
[name]    [id number] 04/22/2011    July 19, 1940   CC: Post op left MRM  HPI: The patient returns for post op follow-up. She underwent a Left MRM on 03/23/11.The PAC placement Over all she feels that she is doing well. They were unable to access the port due to post op swelling  PE: The incision is healing and there is no evidence of infection or hematoma.Small seroma. The PAC incision is doing well and the port palpable.      IMPRESSION: Patient doing well. Mild skin loss of superior flap. Small seromal  PLAN: Seroma aspirated of 15cc. She will call if more issues with the port,and see me in 2-3 weeks for wound check

## 2011-04-23 ENCOUNTER — Telehealth (INDEPENDENT_AMBULATORY_CARE_PROVIDER_SITE_OTHER): Payer: Self-pay | Admitting: General Surgery

## 2011-04-23 DIAGNOSIS — C50919 Malignant neoplasm of unspecified site of unspecified female breast: Secondary | ICD-10-CM

## 2011-04-23 NOTE — Telephone Encounter (Signed)
Patient left voicemail stating she does not have transportation to physical therapy but called her insurance and they suggested having a physical therapist come to her house through home health care.

## 2011-04-24 ENCOUNTER — Other Ambulatory Visit (HOSPITAL_BASED_OUTPATIENT_CLINIC_OR_DEPARTMENT_OTHER): Payer: Medicare Other | Admitting: Lab

## 2011-04-24 ENCOUNTER — Other Ambulatory Visit: Payer: Self-pay

## 2011-04-24 ENCOUNTER — Ambulatory Visit (HOSPITAL_BASED_OUTPATIENT_CLINIC_OR_DEPARTMENT_OTHER): Payer: Medicare Other | Admitting: Family

## 2011-04-24 ENCOUNTER — Encounter: Payer: Self-pay | Admitting: Family

## 2011-04-24 DIAGNOSIS — Z09 Encounter for follow-up examination after completed treatment for conditions other than malignant neoplasm: Secondary | ICD-10-CM

## 2011-04-24 DIAGNOSIS — Z170 Estrogen receptor positive status [ER+]: Secondary | ICD-10-CM

## 2011-04-24 DIAGNOSIS — C50919 Malignant neoplasm of unspecified site of unspecified female breast: Secondary | ICD-10-CM

## 2011-04-24 DIAGNOSIS — E860 Dehydration: Secondary | ICD-10-CM

## 2011-04-24 DIAGNOSIS — F419 Anxiety disorder, unspecified: Secondary | ICD-10-CM

## 2011-04-24 DIAGNOSIS — Z9010 Acquired absence of unspecified breast and nipple: Secondary | ICD-10-CM

## 2011-04-24 DIAGNOSIS — C50319 Malignant neoplasm of lower-inner quadrant of unspecified female breast: Secondary | ICD-10-CM

## 2011-04-24 LAB — COMPREHENSIVE METABOLIC PANEL
BUN: 16 mg/dL (ref 6–23)
CO2: 27 mEq/L (ref 19–32)
Calcium: 9.1 mg/dL (ref 8.4–10.5)
Chloride: 101 mEq/L (ref 96–112)
Creatinine, Ser: 0.69 mg/dL (ref 0.50–1.10)

## 2011-04-24 LAB — CBC WITH DIFFERENTIAL/PLATELET
Basophils Absolute: 0 10*3/uL (ref 0.0–0.1)
Eosinophils Absolute: 0.1 10*3/uL (ref 0.0–0.5)
HGB: 14.1 g/dL (ref 11.6–15.9)
LYMPH%: 70.6 % — ABNORMAL HIGH (ref 14.0–49.7)
MCH: 28.4 pg (ref 25.1–34.0)
MCV: 84.3 fL (ref 79.5–101.0)
MONO#: 0 10*3/uL — ABNORMAL LOW (ref 0.1–0.9)
NEUT%: 14.1 % — ABNORMAL LOW (ref 38.4–76.8)

## 2011-04-24 MED ORDER — SODIUM CHLORIDE 0.9 % IV SOLN: INTRAVENOUS | Status: AC

## 2011-04-24 MED ORDER — LORAZEPAM 2 MG/ML IJ SOLN: 1.0000 mg | Freq: Once | INTRAMUSCULAR | Status: DC

## 2011-04-24 MED ORDER — CIPROFLOXACIN HCL 500 MG PO TABS: 500.0000 mg | ORAL_TABLET | Freq: Two times a day (BID) | ORAL | Status: AC

## 2011-04-24 NOTE — Patient Instructions (Signed)
Take Acyclovir 800 mg every 6 hrs for 7 days. Use Zostrix cream three times daily on area.  Avoid sick people. Wash hands often and use antibacterial hand sanitizer.  Take temp daily, call with temp greater than 100.5 Take Cipro 500 mg twice daily for 7 days. Use Biotine mouthwash and alternate with warm salt water gargles.

## 2011-04-24 NOTE — Progress Notes (Signed)
OFFICE PROGRESS NOTE  CC: Hailey Mcalpine, MD, Dr. Blossom Hoops Streck Dr. Chipper Herb Dr. Romero Belling  DIAGNOSIS: Stage IIIa invasive lobular carcinoma, left breast.    PRIOR THERAPY: 1. Originally seen by Dr. Welton Flakes 03/05/2011 for invasive lobular carcinoma, left breast, ER/PR+, HER-2/neu negative . Stage IIB (T3 N0 M0). 2. Left breast mastectomy with axillary lymph node dissection 03/23/2011. Final pathology revealed a 6.0 cm invasive lobular carcinoma with lymphovascular invasion and perineural invasion. Invasive tumor was 2.9 cm from the nearest margin, lobular carcinoma in situ was noted. Initially 6 lymph nodes were positive for metastatic carcinoma. Tumor deposits were 0.5 0.8 0.7 0.7 0.3 and 1.0 cm focal extracapsular tumor extension was noted. Tumor was grade 3. The final lymph node count was 9 out of 16 positive for metastatic disease. Tumor was ER+100% PR+100% HER-2/neu -, ratio 1.31 proliferation marker 18% Final pathologic stage pT3a pN2a, Stage IIIA.  3. Began adjuvant chemotherapy with [facility] 04/17/11  CURRENT THERAPY: Adjuvant chemotherapy, FEC 100 with Neulasta day 2. She will intially receive 4 cycles, followed by single agent taxol weekly x 12 weeks.  INTERVAL HISTORY: Returns for followup visit and lab check after cycle 1 of chemotherapy 3/15. Received Neulasta day 2. She is very nervous and anxious today.Taking anti-nausea medications as ordered. She is having tenderness at the port a cath site, tells me they were unable to use it for infusion of chemo due to swelling. Says Dr. Jamey Ripa "checked it out this week and says it is fine". Denies nausea or vomitting. No fevers or chills. Reports gum tenderness, using Biotine mouthwash. No ulcers.   Complains of "rash" that is extremely painful on the buttocks, started 3 days age. Has taken Acyclovir in the past, but discontinued with this diagnosis of breast cancer. No headache or blurred vision. No cough or shortness of breath. No  abdominal pain or new bone pain. Bowel and bladder function are normal. Appetite is good, with adequate fluid intake. Remainder of the 10 point  review of systems is negative.   MEDICATIONS:  Current Outpatient Prescriptions  Medication Sig Dispense Refill  . albuterol (PROVENTIL) (2.5 MG/3ML) 0.083% nebulizer solution 2.5 mg every 4 (four) hours as needed. As needed for shortness of breath.      . ALPRAZolam (XANAX) 0.5 MG tablet 1/2-1 tablet three times a day as needed for nerves  90 tablet  5  . amLODipine (NORVASC) 10 MG tablet Take 1 tablet (10 mg total) by mouth daily.  30 tablet  11  . atorvastatin (LIPITOR) 40 MG tablet Take 1 tablet (40 mg total) by mouth daily.  30 tablet  11  . benazepril (LOTENSIN) 40 MG tablet Take 1 tablet (40 mg total) by mouth daily.  30 tablet  11  . Cholecalciferol (VITAMIN D) 2000 UNITS tablet Take 2,000 Units by mouth daily.        . ciprofloxacin (CIPRO) 500 MG tablet Take 1 tablet (500 mg total) by mouth 2 (two) times daily.  14 tablet  0  . dexamethasone (DECADRON) 4 MG tablet Take 2 tablets by mouth once a day on the day after chemotherapy and then take 2 tablets two times a day for 2 days. Take with food.  30 tablet  1  . hydrochlorothiazide 25 MG tablet 1/2 to 1 tablet by mouth once a day as needed for swelling       . HYDROcodone-acetaminophen (NORCO) 5-325 MG per tablet Take 1 tablet by mouth every 6 (six) hours as needed.  30  tablet  0  . lidocaine-prilocaine (EMLA) cream Apply topically as needed.  30 g  3  . LORazepam (ATIVAN) 0.5 MG tablet Take 1 tablet (0.5 mg total) by mouth every 6 (six) hours as needed (Nausea or vomiting).  30 tablet  0  . metFORMIN (GLUCOPHAGE) 500 MG tablet Take 1 tablet (500 mg total) by mouth 2 (two) times daily with a meal.  60 tablet  5  . naproxen (NAPROSYN) 250 MG tablet Take 250 mg by mouth daily as needed. As needed for bursitis pain in hip       . omeprazole (PRILOSEC) 20 MG capsule Take 1 capsule (20 mg total) by  mouth daily.  30 capsule  11  . ondansetron (ZOFRAN) 8 MG tablet Take 1 tablet two times a day as needed for nausea or vomiting starting on the third day after chemotherapy.  30 tablet  1  . potassium chloride SA (K-DUR,KLOR-CON) 20 MEQ tablet Take 20 mEq by mouth daily as needed. (take with the HCTZ)       . prochlorperazine (COMPAZINE) 10 MG tablet Take 1 tablet (10 mg total) by mouth every 6 (six) hours as needed (Nausea or vomiting).  30 tablet  1  . prochlorperazine (COMPAZINE) 25 MG suppository Place 1 suppository (25 mg total) rectally every 12 (twelve) hours as needed for nausea.  12 suppository  3  . sertraline (ZOLOFT) 50 MG tablet Take 1 tablet (50 mg total) by mouth daily.  30 tablet  5  . sitaGLIPtin (JANUVIA) 50 MG tablet Take 50 mg by mouth daily.       Current Facility-Administered Medications  Medication Dose Route Frequency Provider Last Rate Last Dose  . 0.9 %  sodium chloride infusion   Intravenous Continuous Theotis Barrio, FNP      . LORazepam (ATIVAN) injection 1 mg  1 mg Intravenous Once Theotis Barrio, FNP        PHYSICAL EXAMINATION: General: Well developed, well nourished, in no acute distress.  EENT: No ocular or oral lesions. No stomatitis.  Respiratory: Lungs are clear to auscultation bilaterally with normal respiratory movement and no accessory muscle use. Cardiac: No murmur, rub or tachycardia. Rhythm is slightly irregular. No upper or lower extremity edema.  GI: Abdomen is soft, no palpable hepatosplenomegaly. No fluid wave. No tenderness. Musculoskeletal: No kyphosis, no tenderness over the spine, ribs or hips. Lymph: No cervical, infraclavicular, axillary or inguinal adenopathy. Neuro: No focal neurological deficits. Psych: Alert and oriented X 3, anxious.  Skin" Just superior and to the left of the nadal cleft, 6-8 red, raised, fluid filled lesions, 8 cm span.    ECOG PERFORMANCE STATUS: 1 - Symptomatic but completely ambulatory  Blood pressure  114/71, pulse 108, temperature 98.4 F (36.9 C), height 5\' 8"  (1.727 m), weight 192 lb 9.6 oz (87.363 kg).O2 sat 97%.  LABORATORY DATA: Lab Results  Component Value Date   WBC 0.9* ANC 0.1 04/24/2011   HGB 14.1 04/24/2011   HCT 41.8 04/24/2011   MCV 84.3 04/24/2011   PLT 94* 04/24/2011   ASSESSMENT: 71 year old female with 1. Invasive lobular carcinoma of the left breast, on adjuvant chemo with good tolerance. Completed one cycle.  2. Mild stomatitis. 3. Irregular heart rhythm 4. Shingles 5. Dehydration 6. Anxiety 7. Neutropenia  PLAN:  1. EKG in the office today. 2. Fluids in the clinic, with Ativan 0.5 mg.  3. Resume Acyclovir, 800 mg tid for 7 days. 4. Use biotine and alternate with warm salt water  gargles. 5. Prescription for Cipro 500 mg bid X 7 days.  6. She will need postmastectomy radiation therapy following chemo.  7. She will need adjuvant anti-estrogen therapy with letrozole 2.5 mg daily after chemo.  8. Return 3/29 for appt with me and next treatment. .  All questions were answered. The patient knows to call the clinic with any problems, questions or concerns. We can certainly see the patient much sooner if necessary.   Addendum: EKG shows normal sinus rhythm with sinus arrhythmia. Case and EKG findings are reviewed with Dr. Welton Flakes. She agrees with the above recommendations.

## 2011-04-24 NOTE — Telephone Encounter (Signed)
Called patient back and left message for her to call back. Referral made to [facility] for physical therapy. Order in [facility] and order faxed to [facility]. [phone].

## 2011-04-25 ENCOUNTER — Other Ambulatory Visit: Payer: Self-pay | Admitting: Oncology

## 2011-04-27 ENCOUNTER — Telehealth: Payer: Self-pay | Admitting: *Deleted

## 2011-04-27 NOTE — Telephone Encounter (Signed)
Pt. Called.  She had a neulasta shot a week ago and last night experienced back spasms.  She took ativan and then later pain medicine and was finally able to get relief.  Today she is ok.   Let her know that it probably was not due to the neulasta, as it usually does not cause muscle spasms.   She appreciated the call back and has an appt this Friday for lab, [facility].

## 2011-04-29 ENCOUNTER — Telehealth (INDEPENDENT_AMBULATORY_CARE_PROVIDER_SITE_OTHER): Payer: Self-pay | Admitting: General Surgery

## 2011-04-29 NOTE — Telephone Encounter (Signed)
Message copied by Liliana Cline on Wed Apr 29, 2011  2:37 PM ------      Message from: Marnette Burgess      Created: Wed Apr 29, 2011  1:15 PM       Patient was here about a week ago and was told Dr. Jillyn Hidden office would call her with information on the scheduling on physical therapy and she has not received a phone call yet, please call [phone].

## 2011-04-29 NOTE — Telephone Encounter (Signed)
Patient aware [facility] does have the order and are planning on starting visits on Saturday. They will call patient.

## 2011-05-01 ENCOUNTER — Encounter: Payer: Self-pay | Admitting: Family

## 2011-05-01 ENCOUNTER — Ambulatory Visit: Payer: Medicare Other | Admitting: Family

## 2011-05-01 ENCOUNTER — Other Ambulatory Visit (HOSPITAL_BASED_OUTPATIENT_CLINIC_OR_DEPARTMENT_OTHER): Payer: Medicare Other | Admitting: Lab

## 2011-05-01 ENCOUNTER — Ambulatory Visit (HOSPITAL_BASED_OUTPATIENT_CLINIC_OR_DEPARTMENT_OTHER): Payer: Medicare Other

## 2011-05-01 VITALS — BP 152/77 | HR 87 | Temp 98.3°F | Ht 68.0 in | Wt 191.7 lb

## 2011-05-01 DIAGNOSIS — C50319 Malignant neoplasm of lower-inner quadrant of unspecified female breast: Secondary | ICD-10-CM

## 2011-05-01 DIAGNOSIS — C50919 Malignant neoplasm of unspecified site of unspecified female breast: Secondary | ICD-10-CM

## 2011-05-01 DIAGNOSIS — Z5111 Encounter for antineoplastic chemotherapy: Secondary | ICD-10-CM

## 2011-05-01 LAB — CBC WITH DIFFERENTIAL/PLATELET
BASO%: 1.2 % (ref 0.0–2.0)
EOS%: 0.5 % (ref 0.0–7.0)
HCT: 41.2 % (ref 34.8–46.6)
LYMPH%: 25.4 % (ref 14.0–49.7)
MCH: 28.3 pg (ref 25.1–34.0)
MCHC: 33.5 g/dL (ref 31.5–36.0)
MCV: 84.6 fL (ref 79.5–101.0)
MONO%: 6.6 % (ref 0.0–14.0)
NEUT%: 66.3 % (ref 38.4–76.8)
Platelets: 186 10*3/uL (ref 145–400)

## 2011-05-01 LAB — COMPREHENSIVE METABOLIC PANEL
ALT: 23 U/L (ref 0–35)
AST: 17 U/L (ref 0–37)
BUN: 13 mg/dL (ref 6–23)
Creatinine, Ser: 0.74 mg/dL (ref 0.50–1.10)
Total Bilirubin: 0.3 mg/dL (ref 0.3–1.2)

## 2011-05-01 MED ORDER — HEPARIN SOD (PORK) LOCK FLUSH 100 UNIT/ML IV SOLN
500.0000 [IU] | Freq: Once | INTRAVENOUS | Status: AC | PRN
  Administered 2011-05-01: 500 [IU]
  Filled 2011-05-01: qty 5

## 2011-05-01 MED ORDER — LORAZEPAM 1 MG PO TABS
1.0000 mg | ORAL_TABLET | Freq: Once | ORAL | Status: AC
  Administered 2011-05-01: 1 mg via SUBLINGUAL

## 2011-05-01 MED ORDER — DEXAMETHASONE SODIUM PHOSPHATE 4 MG/ML IJ SOLN
12.0000 mg | Freq: Once | INTRAMUSCULAR | Status: AC
  Administered 2011-05-01: 12 mg via INTRAVENOUS

## 2011-05-01 MED ORDER — SODIUM CHLORIDE 0.9 % IV SOLN
500.0000 mg/m2 | Freq: Once | INTRAVENOUS | Status: AC
  Administered 2011-05-01: 1040 mg via INTRAVENOUS
  Filled 2011-05-01: qty 52

## 2011-05-01 MED ORDER — SODIUM CHLORIDE 0.9 % IV SOLN
Freq: Once | INTRAVENOUS | Status: AC
  Administered 2011-05-01: 11:00:00 via INTRAVENOUS

## 2011-05-01 MED ORDER — FLUOROURACIL CHEMO INJECTION 2.5 GM/50ML
500.0000 mg/m2 | Freq: Once | INTRAVENOUS | Status: AC
  Administered 2011-05-01: 1050 mg via INTRAVENOUS
  Filled 2011-05-01: qty 21

## 2011-05-01 MED ORDER — EPIRUBICIN HCL CHEMO IV INJECTION 200 MG/100ML
100.0000 mg/m2 | Freq: Once | INTRAVENOUS | Status: AC
  Administered 2011-05-01: 208 mg via INTRAVENOUS
  Filled 2011-05-01: qty 104

## 2011-05-01 MED ORDER — FOSAPREPITANT DIMEGLUMINE INJECTION 150 MG
150.0000 mg | Freq: Once | INTRAVENOUS | Status: AC
  Administered 2011-05-01: 150 mg via INTRAVENOUS
  Filled 2011-05-01: qty 5

## 2011-05-01 MED ORDER — SODIUM CHLORIDE 0.9 % IJ SOLN
10.0000 mL | INTRAMUSCULAR | Status: DC | PRN
  Administered 2011-05-01: 10 mL
  Filled 2011-05-01: qty 10

## 2011-05-01 MED ORDER — PALONOSETRON HCL INJECTION 0.25 MG/5ML
0.2500 mg | Freq: Once | INTRAVENOUS | Status: AC
  Administered 2011-05-01: 0.25 mg via INTRAVENOUS

## 2011-05-01 NOTE — Patient Instructions (Addendum)
Call if any problems. Pt aware of next appt. Pt left ambulatory with son. This being 2nd administration of chemo her hair may start falling out faster. Pt has wig and multiple scarves

## 2011-05-01 NOTE — Progress Notes (Signed)
Brisk blood return checked every 3cc chemo IVP.

## 2011-05-01 NOTE — Progress Notes (Signed)
OFFICE PROGRESS NOTE  CC: Hailey Mcalpine, MD, Dr. Blossom Hoops Streck Dr. Chipper Herb Dr. Romero Belling  DIAGNOSIS: Stage IIIa invasive lobular carcinoma, left breast.    PRIOR THERAPY: 1. Originally seen by Dr. Welton Flakes 03/05/2011 for invasive lobular carcinoma, left breast, ER/PR+, HER-2/neu negative . Stage IIB (T3 N0 M0). 2. Left breast mastectomy with axillary lymph node dissection 03/23/2011. Final pathology revealed a 6.0 cm invasive lobular carcinoma with lymphovascular invasion and perineural invasion. Invasive tumor was 2.9 cm from the nearest margin, lobular carcinoma in situ was noted. Initially 6 lymph nodes were positive for metastatic carcinoma. Tumor deposits were 0.5 0.8 0.7 0.7 0.3 and 1.0 cm focal extracapsular tumor extension was noted. Tumor was grade 3. The final lymph node count was 9 out of 16 positive for metastatic disease. Tumor was ER+100% PR+100% HER-2/neu -, ratio 1.31 proliferation marker 18% Final pathologic stage pT3a pN2a, Stage IIIA.  3. Began adjuvant chemotherapy with [facility] 04/17/11  CURRENT THERAPY: Adjuvant chemotherapy, FEC 100 with Neulasta day 2. She will intially receive 4 cycles, followed by single agent taxol weekly x 12 weeks.  INTERVAL HISTORY: Returns for cycle 2 of chemotherapy 3/15. Received Neulasta day 2. 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 Taking anti-nausea medications as ordered. Tenderness at the port a cath site has improved. To date, they have been unable to use the port for chemo or blood draws due to tenderness and edema.   Denies nausea or vomitting. No fevers or chills. Reported gum tenderness on previous visit, using Biotine mouthwash with resolution. No ulcers.   Shingles on the upper buttocks is less painful, lesions are healing. Resumed Acyclovir, has been taking 800 mg qid. No headache or blurred vision. Completed Cipro prescription, prophylaxis for neutropenia. No  infection. No cough or shortness of breath. No abdominal pain or new bone pain. Bowel and bladder function are normal. Appetite is good, with adequate fluid intake. Remainder of the 10 point  review of systems is negative.  PHYSICAL EXAMINATION: General: Well developed, well nourished, in no acute distress.  EENT: No ocular or oral lesions. No stomatitis.  Respiratory: Lungs are clear to auscultation bilaterally with normal respiratory movement and no accessory muscle use. Cardiac: No murmur, rub or tachycardia. Rhythm is slightly irregular. No upper or lower extremity edema.  GI: Abdomen is soft, no palpable hepatosplenomegaly. No fluid wave. No tenderness. Musculoskeletal: No kyphosis, no tenderness over the spine, ribs or hips. Lymph: No cervical, infraclavicular, axillary or inguinal adenopathy. Neuro: No focal neurological deficits. Psych: Alert and oriented X 3, anxious.  Skin: Just superior and to the left of the nadal cleft, 6-8 dry crusted lesions, 8 cm span.   Left mastectomy incision has dry eschar. Mild erythema inferior to the incision. No drainage.   ECOG PERFORMANCE STATUS: 1 - Symptomatic but completely ambulatory  Blood pressure 152/77, pulse 87, temperature 98.3 F (36.8 C), temperature source Oral, height 5\' 8"  (1.727 m), weight 191 lb 11.2 oz (86.955 kg).O2 sat 97%.  LABORATORY DATA: Lab Results  Component Value Date   WBC 0.9* ANC 0.1 04/24/2011   HGB 14.1 04/24/2011   HCT 41.8 04/24/2011   MCV 84.3 04/24/2011   PLT 94* 04/24/2011   ASSESSMENT: 71 year old female with: 1. Invasive lobular carcinoma of the left breast, on adjuvant chemo with good tolerance. Completed one cycle.  2. Mild stomatitis with first round, resolved. 3. Shingles, improving. 4. Labs OK to treat.  PLAN:  1. Reduce dose of Acyclovir to maintenance dose; 400 mg tid. 2. Use biotine and alternate with warm salt water gargles as needed. . 3. She will need postmastectomy radiation therapy  following chemo.  4. She will need adjuvant anti-estrogen therapy with letrozole 2.5 mg daily after chemo.  5. Return 4/3 for appt with me and lab check only.  6. Follow-up 4/5 with Dr. Jamey Ripa for post-op visit.   All questions were answered. The patient knows to call the clinic with any problems, questions or concerns. We can certainly see the patient much sooner if necessary.

## 2011-05-02 ENCOUNTER — Ambulatory Visit (HOSPITAL_BASED_OUTPATIENT_CLINIC_OR_DEPARTMENT_OTHER): Payer: Medicare Other

## 2011-05-02 VITALS — BP 158/76 | HR 100 | Temp 99.0°F

## 2011-05-02 DIAGNOSIS — C50319 Malignant neoplasm of lower-inner quadrant of unspecified female breast: Secondary | ICD-10-CM

## 2011-05-02 MED ORDER — PEGFILGRASTIM INJECTION 6 MG/0.6ML
6.0000 mg | Freq: Once | SUBCUTANEOUS | Status: AC
  Administered 2011-05-02: 6 mg via SUBCUTANEOUS

## 2011-05-06 ENCOUNTER — Ambulatory Visit (HOSPITAL_BASED_OUTPATIENT_CLINIC_OR_DEPARTMENT_OTHER): Payer: Medicare Other | Admitting: Family

## 2011-05-06 ENCOUNTER — Other Ambulatory Visit: Payer: Self-pay | Admitting: Physician Assistant

## 2011-05-06 ENCOUNTER — Ambulatory Visit (HOSPITAL_BASED_OUTPATIENT_CLINIC_OR_DEPARTMENT_OTHER): Payer: Medicare Other

## 2011-05-06 ENCOUNTER — Other Ambulatory Visit (HOSPITAL_BASED_OUTPATIENT_CLINIC_OR_DEPARTMENT_OTHER): Payer: Medicare Other | Admitting: Lab

## 2011-05-06 VITALS — BP 138/83 | HR 120 | Temp 98.4°F | Ht 68.0 in | Wt 186.7 lb

## 2011-05-06 DIAGNOSIS — E860 Dehydration: Secondary | ICD-10-CM

## 2011-05-06 DIAGNOSIS — C50319 Malignant neoplasm of lower-inner quadrant of unspecified female breast: Secondary | ICD-10-CM

## 2011-05-06 DIAGNOSIS — K1231 Oral mucositis (ulcerative) due to antineoplastic therapy: Secondary | ICD-10-CM

## 2011-05-06 DIAGNOSIS — C50919 Malignant neoplasm of unspecified site of unspecified female breast: Secondary | ICD-10-CM

## 2011-05-06 LAB — CBC WITH DIFFERENTIAL/PLATELET
Basophils Absolute: 0 10*3/uL (ref 0.0–0.1)
EOS%: 1.3 % (ref 0.0–7.0)
HCT: 42.2 % (ref 34.8–46.6)
HGB: 13.9 g/dL (ref 11.6–15.9)
LYMPH%: 29.9 % (ref 14.0–49.7)
MCH: 28.8 pg (ref 25.1–34.0)
MCV: 87.7 fL (ref 79.5–101.0)
NEUT%: 67.6 % (ref 38.4–76.8)
Platelets: 122 10*3/uL — ABNORMAL LOW (ref 145–400)
lymph#: 0.6 10*3/uL — ABNORMAL LOW (ref 0.9–3.3)

## 2011-05-06 LAB — COMPREHENSIVE METABOLIC PANEL
ALT: 20 U/L (ref 0–35)
AST: 15 U/L (ref 0–37)
Creatinine, Ser: 0.6 mg/dL (ref 0.50–1.10)
Sodium: 136 mEq/L (ref 135–145)
Total Bilirubin: 0.8 mg/dL (ref 0.3–1.2)

## 2011-05-06 MED ORDER — LORAZEPAM 2 MG/ML IJ SOLN
0.5000 mg | Freq: Once | INTRAMUSCULAR | Status: AC
  Administered 2011-05-06: 0.5 mg via INTRAVENOUS

## 2011-05-06 MED ORDER — SODIUM CHLORIDE 0.9 % IJ SOLN
10.0000 mL | Freq: Once | INTRAMUSCULAR | Status: AC
  Administered 2011-05-06: 10 mL via INTRAVENOUS
  Filled 2011-05-06: qty 10

## 2011-05-06 MED ORDER — LORAZEPAM 1 MG PO TABS
0.5000 mg | ORAL_TABLET | Freq: Once | ORAL | Status: AC
  Administered 2011-05-06: 0.5 mg via ORAL

## 2011-05-06 MED ORDER — HEPARIN SOD (PORK) LOCK FLUSH 100 UNIT/ML IV SOLN
500.0000 [IU] | Freq: Once | INTRAVENOUS | Status: AC
  Administered 2011-05-06: 500 [IU] via INTRAVENOUS
  Filled 2011-05-06: qty 5

## 2011-05-06 MED ORDER — SODIUM CHLORIDE 0.9 % IV SOLN
Freq: Once | INTRAVENOUS | Status: AC
  Administered 2011-05-06: 10:00:00 via INTRAVENOUS

## 2011-05-06 NOTE — Patient Instructions (Signed)
Pt to call with concerns. Discharged via wheelchair due to tired/weakness with Husband

## 2011-05-07 ENCOUNTER — Telehealth: Payer: Self-pay | Admitting: Pulmonary Disease

## 2011-05-07 ENCOUNTER — Telehealth: Payer: Self-pay | Admitting: Medical Oncology

## 2011-05-07 NOTE — Telephone Encounter (Signed)
Pt has called back & asked to be called back in the next few minutes.  [name]

## 2011-05-07 NOTE — Telephone Encounter (Signed)
I spoke with pt and she stated he BS yesterday was 388 and today it's 380. She is taking metformin 500 mg 1 BID and januvia 50mg  QD. She states she is watching what she eats. Pt is requesting further recs from SN. Please advise thanks  Allergies  Allergen Reactions  . Ceftriaxone Sodium     REACTION: hives  . Cephalexin     REACTION: hives  . Clindamycin     REACTION: hives  . Dilaudid (Hydromorphone Hcl) Itching  . Oxycodone-Acetaminophen     REACTION: hives  . Rocephin (Ceftriaxone Sodium In Dextrose) Hives  . Sulfonamide Derivatives     REACTION: hives

## 2011-05-07 NOTE — Progress Notes (Signed)
Hematology and Oncology Follow Up Visit  CARELI LUZADER [id number] [birth year] 71 y.o. 05/06/11    HPI: Ms. Wirsing is a 71 year old [place] woman with a history of an ER/PR positive, and 100% respectively, HER-2 negative infiltrating lobular carcinoma of the left breast, S./P. left modified radical mastectomy with axillary lymph node dissection on 03/23/2011, final pathologic staging pT3a, pN2A, therefore stage IIIa disease. Currently receiving active adjuvant chemotherapy, specifically day 5, cycle 2 of 4 planned adjuvant dose dense FEC with Neulasta support on day 2.  Interim History:   Ms. Cavallero is seen today with her daughter in accompaniment for followup after her second of 4 planned adjuvant dose dense FEC. She is feeling quite fatigued, she also thinks she may be dehydrated. She requests IV fluid hydration. She is trying to stay well-hydrated, she is having no difficulty with urinating. She is also having normal bowel movements, but notes that they are looser than normal. She denies any fevers or chills, she does get short of breath with exertion but she denies any chest pain. She does report occasional "heart palpitations". She notes that her mouth is quite sensitive, she has been utilized by 18, but feels it is not effective. She also describes a vaginal yeast infection with discharge and itching. She has a history of severe afebrile neutropenia, she has utilized Cipro in the past for neutropenic precautions. She denies any nausea or emesis.  A detailed review of systems is otherwise noncontributory as noted below.  Review of Systems: Constitutional:  fatigued and generally weak Eyes: no complaints WGN:FAOZ throat and oral sensitivity Cardiovascular: Occasional rapid heartbeat. Respiratory: positive for - shortness of breath Neurological: no TIA or stroke symptoms Dermatological: negative Gastrointestinal: Occasional looser stools but no frank  diarrhea Genito-Urinary: no dysuria, trouble voiding, or hematuria Hematological and Lymphatic: negative Breast: negative Musculoskeletal: negative Remaining ROS negative.  Medications:   I have reviewed the patient's current medications.  Current Outpatient Prescriptions  Medication Sig Dispense Refill  . albuterol (PROVENTIL) (2.5 MG/3ML) 0.083% nebulizer solution 2.5 mg every 4 (four) hours as needed. As needed for shortness of breath.      . ALPRAZolam (XANAX) 0.5 MG tablet 1/2-1 tablet three times a day as needed for nerves  90 tablet  5  . amLODipine (NORVASC) 10 MG tablet Take 1 tablet (10 mg total) by mouth daily.  30 tablet  11  . atorvastatin (LIPITOR) 40 MG tablet Take 1 tablet (40 mg total) by mouth daily.  30 tablet  11  . benazepril (LOTENSIN) 40 MG tablet Take 1 tablet (40 mg total) by mouth daily.  30 tablet  11  . Cholecalciferol (VITAMIN D) 2000 UNITS tablet Take 2,000 Units by mouth daily.        Marland Kitchen dexamethasone (DECADRON) 4 MG tablet Take 2 tablets by mouth once a day on the day after chemotherapy and then take 2 tablets two times a day for 2 days. Take with food.  30 tablet  1  . hydrochlorothiazide 25 MG tablet 1/2 to 1 tablet by mouth once a day as needed for swelling       . HYDROcodone-acetaminophen (NORCO) 5-325 MG per tablet Take 1 tablet by mouth every 6 (six) hours as needed.  30 tablet  0  . lidocaine-prilocaine (EMLA) cream Apply topically as needed.  30 g  3  . LORazepam (ATIVAN) 0.5 MG tablet Take 1 tablet (0.5 mg total) by mouth every 6 (six) hours as needed (Nausea or vomiting).  30  tablet  0  . metFORMIN (GLUCOPHAGE) 500 MG tablet Take 1 tablet (500 mg total) by mouth 2 (two) times daily with a meal.  60 tablet  5  . naproxen (NAPROSYN) 250 MG tablet Take 250 mg by mouth daily as needed. As needed for bursitis pain in hip       . omeprazole (PRILOSEC) 20 MG capsule Take 1 capsule (20 mg total) by mouth daily.  30 capsule  11  . ondansetron (ZOFRAN) 8 MG  tablet Take 1 tablet two times a day as needed for nausea or vomiting starting on the third day after chemotherapy.  30 tablet  1  . potassium chloride SA (K-DUR,KLOR-CON) 20 MEQ tablet Take 20 mEq by mouth daily as needed. (take with the HCTZ)       . prochlorperazine (COMPAZINE) 10 MG tablet Take 1 tablet (10 mg total) by mouth every 6 (six) hours as needed (Nausea or vomiting).  30 tablet  1  . prochlorperazine (COMPAZINE) 25 MG suppository Place 1 suppository (25 mg total) rectally every 12 (twelve) hours as needed for nausea.  12 suppository  3  . sertraline (ZOLOFT) 50 MG tablet Take 1 tablet (50 mg total) by mouth daily.  30 tablet  5  . sitaGLIPtin (JANUVIA) 50 MG tablet Take 50 mg by mouth daily.       Current Facility-Administered Medications  Medication Dose Route Frequency Provider Last Rate Last Dose  . 0.9 %  sodium chloride infusion   Intravenous Once Amada Kingfisher, [place]      . LORazepam (ATIVAN) injection 0.5 mg  0.5 mg Intravenous Once Amada Kingfisher, PA   0.5 mg at 05/06/11 1010  . LORazepam (ATIVAN) tablet 0.5 mg  0.5 mg Oral Once Amada Kingfisher, PA   0.5 mg at 05/06/11 1026   Facility-Administered Medications Ordered in Other Visits  Medication Dose Route Frequency Provider Last Rate Last Dose  . heparin lock flush 100 unit/mL  500 Units Intravenous Once Theotis Barrio, FNP   500 Units at 05/06/11 1215  . sodium chloride 0.9 % injection 10 mL  10 mL Intravenous Once Theotis Barrio, FNP   10 mL at 05/06/11 1215    Allergies:  Allergies  Allergen Reactions  . Ceftriaxone Sodium     REACTION: hives  . Cephalexin     REACTION: hives  . Clindamycin     REACTION: hives  . Dilaudid (Hydromorphone Hcl) Itching  . Oxycodone-Acetaminophen     REACTION: hives  . Rocephin (Ceftriaxone Sodium In Dextrose) Hives  . Sulfonamide Derivatives     REACTION: hives    Physical Exam: Filed Vitals:   05/06/11 0918  BP: 138/83  Pulse: 120  Temp: 98.4 F (36.9 C)     Body mass index is 28.39 kg/(m^2). Weight: 186 lbs. HEENT:  Sclerae anicteric, conjunctivae pink.  Oropharynx clear.  Mild mucositis, but no frank evidence of oral candidiasis.   Nodes:  No cervical, supraclavicular, or axillary lymphadenopathy palpated.  Breast Exam:  Left breast is surgically absent, the mastectomy scar is continuing to heal by secondary intent. There is no erythema surrounding the wound site, alert and oriented x 3. Lungs:  Clear to auscultation bilaterally.  No crackles, rhonchi, or wheezes.   Heart:  Regular rate and rhythm.   Abdomen:  Soft, nontender.  Positive bowel sounds.  No organomegaly or masses palpated.   Musculoskeletal:  No focal spinal tenderness to palpation.  Extremities:  Benign.  No peripheral edema  or cyanosis.   Skin:  Benign.   Neuro:  Nonfocal, alert and oriented x 3.   Lab Results: Lab Results  Component Value Date   WBC 1.9* 05/06/2011   HGB 13.9 05/06/2011   HCT 42.2 05/06/2011   MCV 87.7 05/06/2011   PLT 122* 05/06/2011   NEUTROABS 1.3* 05/06/2011     Chemistry      Component Value Date/Time   NA 136 05/06/2011 0859   K 3.8 05/06/2011 0859   CL 97 05/06/2011 0859   CO2 28 05/06/2011 0859   BUN 17 05/06/2011 0859   CREATININE 0.60 05/06/2011 0859      Component Value Date/Time   CALCIUM 9.2 05/06/2011 0859   ALKPHOS 155* 05/06/2011 0859   AST 15 05/06/2011 0859   ALT 20 05/06/2011 0859   BILITOT 0.8 05/06/2011 0859      Lab Results  Component Value Date   LABCA2 21 03/03/2011    Radiological Studies: Ct Chest W Contrast 04/09/2011  *RADIOLOGY REPORT*  Clinical Data:  Stage III breast cancer.  Lower inner quadrant of the breast.  History of diabetes.  Obesity.  Gastroesophageal reflux disease.  CT CHEST, ABDOMEN AND PELVIS WITH CONTRAST  Technique: Contiguous axial images of the chest abdomen and pelvis were obtained after IV contrast administration.  Contrast: 100  ml Omnipaque-300  Comparison: PET of same date, dictated separately.  Breast MR  02/27/2011.  Abdominal pelvic CT of 03/02/2005 from [facility] (prior report not available).  CT CHEST  Findings: Lung windows demonstrate no nodules or airspace opacities.  Soft tissue windows demonstrate left mastectomy.  Left axillary nodal dissection.  No axillary or subpectoral adenopathy.  Small left supraclavicular nodes.  Sub centimeter bilateral thyroid nodules are nonspecific.  Heart size upper normal, without pericardial or pleural effusion. A tiny hiatal hernia.  No central pulmonary embolism, on this non-dedicated study.  No mediastinal or hilar adenopathy.  A surgical drain terminates in the left axilla.  IMPRESSION:  1.  Status post left mastectomy.  No evidence of residual or metastatic disease. 2.  Small hiatal hernia.  CT ABDOMEN AND PELVIS  Findings:  Mild hepatic steatosis.  A lateral segment left liver lobe 9 mm lesion on image 48.  Too small to entirely characterize, but similar on 03/02/2005.  Normal spleen, distal stomach, adrenal glands.  Mild pancreatic atrophy. Cholecystectomy without biliary ductal dilatation.  Normal kidneys.  Aortocaval nodes are small and similar to 2007. No retroperitoneal or retrocrural adenopathy.  Normal colon and appendix. Normal small bowel without abdominal ascites.    No evidence of omental or peritoneal disease.  No pelvic adenopathy.    Normal urinary bladder.  Hysterectomy.  No adnexal mass.  No significant free fluid.  No acute osseous abnormality.  IMPRESSION:  1. No acute process or evidence of metastatic disease in the abdomen or pelvis. 2.  Hepatic steatosis.  A lateral segment left liver lobe low density lesion is similar to 2007, most consistent with a small cyst or less likely hemangioma.  Original Report Authenticated By: Consuello Bossier, M.D.   Ct Abdomen Pelvis W Contrast 04/09/2011  *RADIOLOGY REPORT*  Clinical Data:  Stage III breast cancer.  Lower inner quadrant of the breast.  History of diabetes.  Obesity.  Gastroesophageal reflux  disease.  CT CHEST, ABDOMEN AND PELVIS WITH CONTRAST  Technique: Contiguous axial images of the chest abdomen and pelvis were obtained after IV contrast administration.  Contrast: 100  ml Omnipaque-300  Comparison: PET of same  date, dictated separately.  Breast MR 02/27/2011.  Abdominal pelvic CT of 03/02/2005 from [facility] (prior report not available).  CT CHEST  Findings: Lung windows demonstrate no nodules or airspace opacities.  Soft tissue windows demonstrate left mastectomy.  Left axillary nodal dissection.  No axillary or subpectoral adenopathy.  Small left supraclavicular nodes.  Sub centimeter bilateral thyroid nodules are nonspecific.  Heart size upper normal, without pericardial or pleural effusion. A tiny hiatal hernia.  No central pulmonary embolism, on this non-dedicated study.  No mediastinal or hilar adenopathy.  A surgical drain terminates in the left axilla.  IMPRESSION:  1.  Status post left mastectomy.  No evidence of residual or metastatic disease. 2.  Small hiatal hernia.  CT ABDOMEN AND PELVIS  Findings:  Mild hepatic steatosis.  A lateral segment left liver lobe 9 mm lesion on image 48.  Too small to entirely characterize, but similar on 03/02/2005.  Normal spleen, distal stomach, adrenal glands.  Mild pancreatic atrophy. Cholecystectomy without biliary ductal dilatation.  Normal kidneys.  Aortocaval nodes are small and similar to 2007. No retroperitoneal or retrocrural adenopathy.  Normal colon and appendix. Normal small bowel without abdominal ascites.    No evidence of omental or peritoneal disease.  No pelvic adenopathy.    Normal urinary bladder.  Hysterectomy.  No adnexal mass.  No significant free fluid.  No acute osseous abnormality.  IMPRESSION:  1. No acute process or evidence of metastatic disease in the abdomen or pelvis. 2.  Hepatic steatosis.  A lateral segment left liver lobe low density lesion is similar to 2007, most consistent with a small cyst or less likely  hemangioma.  Original Report Authenticated By: Consuello Bossier, M.D.   Nm Pet Image Initial (pi) Skull Base To Thigh 04/09/2011  *RADIOLOGY REPORT*  Clinical Data: Initial treatment strategy for stage III breast cancer.  Status post mastectomy.  NUCLEAR MEDICINE PET CT SKULL BASE TO THIGH  Technique:  19.7 mCi F-18 FDG was injected intravenously via the right AC.  Full-ring PET imaging was performed from the skull base through the mid-thighs 70  minutes after injection.  CT data was obtained and used for attenuation correction and anatomic localization only.  (This was not acquired as a diagnostic CT examination.)  Fasting Blood Glucose:  189  Patient Weight:  194 pounds.  Comparison: Today's diagnostic CTs, dictated separately.  Findings: PET images demonstrate no abnormal activity within the neck.  There is low level hypermetabolism about the left axilla, in the region of the surgical clips.  This is likely postoperative. No adenopathy in this area.  Hypermetabolism about the right greater trochanter likely represents trochanteric bursitis.  CT images performed for attenuation correction demonstrate no significant findings within the neck.  Chest, abdomen, and pelvic findings deferred to diagnostic CTs, dictated separately.  IMPRESSION: Status post left mastectomy. Postoperative low level hypermetabolism about surgical clips in the left axilla.  No evidence of hypermetabolic residual or metastatic disease.  Original Report Authenticated By: Consuello Bossier, M.D.   Dg Chest Port 1 View 04/13/2011  *RADIOLOGY REPORT*  Clinical Data: Port placement  PORTABLE CHEST - 1 VIEW  Comparison: 04/09/2011  Findings: Right chest wall porta-catheter is noted with tip in the SVC.  The heart size appears normal.  No pleural effusion or interstitial edema identified.  No airspace consolidation identified.  No pneumothorax is visible.  Surgical clips identified within the left axilla.  IMPRESSION:  1.  No active cardiopulmonary  abnormalities. 2.  Successful  placement of right chest wall porta-catheter with tip in the SVC.  Original Report Authenticated By: Rosealee Albee, M.D.    Assessment:  Ms. Lightle is a 71 year old [place] woman with a history of an ER/PR positive, and 100% respectively, HER-2 negative infiltrating lobular carcinoma of the left breast, S./P. left modified radical mastectomy with axillary lymph node dissection on 03/23/2011, final pathologic staging pT3a, pN2A, therefore stage IIIa disease. Currently receiving active adjuvant chemotherapy, specifically day 5, cycle 2 of 4 planned adjuvant dose dense FEC with Neulasta support on day 2. 2. Mild oral mucositis symptoms no evidence of frank oral candidiasis. 3. Reported vaginal candidiasis.  Case reviewed with Dr. Pierce Crane in Dr. Milta Deiters absence  Plan:  Per patient request, we will proceed with a liter of normal saline. She is also being given oral Ativan for anticipatory nausea, she can also received IV Ativan as necessary. She will not require ciprofloxacin at this point, and given the fact that she is suggesting vaginal candidiasis symptoms I feel this would be contraindicated. A prescription for Diflucan 100 mg to be taken over the next 5 days was provided. I have also provided her a prescription for 2% viscous lidocaine/Maalox/Benadryl suspension solution in hopes of easing some of her mild mucositis symptoms. Again, hydration was encouraged. She'll return in one week's time for followup exam for day 1 cycle 3 of 4 planned adjuvant dose dense FEC. She knows to contact us sooner if the need should arise.   This plan was reviewed with the patient, who voices understanding and agreement.  She knows to call with any changes or problems.    Bailee Thall T, PA-C 05/06/11

## 2011-05-07 NOTE — Telephone Encounter (Signed)
[name], [facility], stated the pt's blood glucose level tested at 424 today.  [name] can be reached at [phone], but prefers that we call back the pt.  [name]

## 2011-05-07 NOTE — Telephone Encounter (Signed)
I called  Dr. Jodelle Green office and spoke with triage nurse regarding pt's blood sugars. She is going to discuss with Dr. Jodelle Green nurse and call me back.

## 2011-05-07 NOTE — Telephone Encounter (Signed)
Felicita Gage, RN @ [facility] called.  Stated pt's daughter called regarding pt's glucose which is now up to 454.  Requests SN's nurse to call her back asap.  Antionette Fairy

## 2011-05-07 NOTE — Telephone Encounter (Signed)
Called and spoke with pt about this message per SN----  1.  Increase water intake to help flush the glucose out in her urine 2.  Have her talk to Dr. Park Breed to decrease the decadron dose if he is able.  She will need appt with Dr. Everardo All ASAP for recs.  Pt stated that she cancelled her appt on Monday with Dr. Everardo All due to she didn't feel that she would be able to make the appt.  i offered to call downstairs to get her an appt but pt stated that she will call for this.  Pt voiced her understanding of all of SN recs.

## 2011-05-07 NOTE — Telephone Encounter (Signed)
I called pt to see if Dr. Jodelle Green nurse called her back regarding her blood sugar. She states they did. I just wanted to follow up with her and she was grateful.

## 2011-05-07 NOTE — Telephone Encounter (Signed)
Pt and her daughter [name] called stating that they saw [name] yesterday and pt's blood sugar was 455. Today her sugars remain in the 400's with the most recent 454. Pt just does not feel well.  [name] asked them to call if blood sugars continues to be high. Pt is taking metforim 500 mg BID  and Januvia  50 mg. I asked who prescribes her diabetes medication and she states Dr. Kriste Basque. She states that they have called Dr. Jodelle Green office x 2 but no one has called back to let them know what to do.  I will follow up with [name] and or Dr. Jodelle Green office and call her back. She voiced understanding.

## 2011-05-08 ENCOUNTER — Encounter (INDEPENDENT_AMBULATORY_CARE_PROVIDER_SITE_OTHER): Payer: Self-pay | Admitting: Surgery

## 2011-05-08 ENCOUNTER — Ambulatory Visit (INDEPENDENT_AMBULATORY_CARE_PROVIDER_SITE_OTHER): Payer: Medicare Other | Admitting: Surgery

## 2011-05-08 VITALS — BP 120/68 | HR 88 | Temp 98.3°F | Resp 20 | Ht 68.0 in | Wt 183.4 lb

## 2011-05-08 DIAGNOSIS — Z09 Encounter for follow-up examination after completed treatment for conditions other than malignant neoplasm: Secondary | ICD-10-CM

## 2011-05-08 NOTE — Patient Instructions (Signed)
Shower the incision daily and apply a new sterile gauze.

## 2011-05-08 NOTE — Progress Notes (Signed)
[name]    [id number] 05/08/2011    19-Dec-1940   CC: Post op left MRM  HPI: The patient returns for post op follow-up. She underwent a Left MRM on 03/23/11.The PAC placement Over all she feels that she is doing well. The port was accessed OK the last time it was needed  PE: The incision is healing and there is no evidence of infection. There is a small area of necrotic skin medially with some necrotic subq.. The PAC incision is doing well and the port palpable.      IMPRESSION: Patient doing well. Mild skin loss of superior flap.   PLAN: I debrided the necrotic skin to clean granulations. She will shower and keep sterile dressing on. RTC 2 weeks

## 2011-05-11 ENCOUNTER — Other Ambulatory Visit: Payer: Self-pay | Admitting: Certified Registered Nurse Anesthetist

## 2011-05-11 ENCOUNTER — Ambulatory Visit: Payer: Medicare Other | Admitting: Endocrinology

## 2011-05-13 ENCOUNTER — Telehealth (INDEPENDENT_AMBULATORY_CARE_PROVIDER_SITE_OTHER): Payer: Self-pay | Admitting: General Surgery

## 2011-05-13 NOTE — Telephone Encounter (Signed)
Last office note mentions nothing about opening wound. I spoke with patient and made appt for her to come in tomorrow to have this area checked.

## 2011-05-13 NOTE — Telephone Encounter (Signed)
Pt calling with concerns about her non-healing incision.  At last office visit, she was sent home to pack the wound.  She is having trouble doing this herself.  She states it stinks and is draining old blood.  Pt asking if possible to have home health come to do this for her.  Her next appt is Monday, 05/18/11.  She is quite frustrated with length of time to recover and heal this wound.

## 2011-05-14 ENCOUNTER — Ambulatory Visit (HOSPITAL_BASED_OUTPATIENT_CLINIC_OR_DEPARTMENT_OTHER): Payer: Medicare Other | Admitting: Family

## 2011-05-14 ENCOUNTER — Ambulatory Visit (INDEPENDENT_AMBULATORY_CARE_PROVIDER_SITE_OTHER): Payer: Medicare Other | Admitting: Surgery

## 2011-05-14 ENCOUNTER — Telehealth: Payer: Self-pay | Admitting: *Deleted

## 2011-05-14 ENCOUNTER — Other Ambulatory Visit: Payer: Self-pay | Admitting: Pulmonary Disease

## 2011-05-14 ENCOUNTER — Encounter (INDEPENDENT_AMBULATORY_CARE_PROVIDER_SITE_OTHER): Payer: Self-pay | Admitting: Surgery

## 2011-05-14 ENCOUNTER — Telehealth (INDEPENDENT_AMBULATORY_CARE_PROVIDER_SITE_OTHER): Payer: Self-pay | Admitting: General Surgery

## 2011-05-14 ENCOUNTER — Encounter: Payer: Self-pay | Admitting: Family

## 2011-05-14 ENCOUNTER — Other Ambulatory Visit (HOSPITAL_BASED_OUTPATIENT_CLINIC_OR_DEPARTMENT_OTHER): Payer: Medicare Other | Admitting: Lab

## 2011-05-14 ENCOUNTER — Emergency Department (HOSPITAL_COMMUNITY)
Admission: EM | Admit: 2011-05-14 | Discharge: 2011-05-14 | Disposition: A | Discharge status: Home / Self Care | Payer: Medicare Other | Attending: Emergency Medicine | Admitting: Emergency Medicine

## 2011-05-14 ENCOUNTER — Telehealth (INDEPENDENT_AMBULATORY_CARE_PROVIDER_SITE_OTHER): Payer: Self-pay | Admitting: Surgery

## 2011-05-14 VITALS — BP 124/76 | HR 92 | Temp 98.6°F | Resp 20 | Ht 68.0 in | Wt 184.0 lb

## 2011-05-14 VITALS — BP 135/79 | HR 111 | Temp 98.4°F | Ht 68.0 in | Wt 184.7 lb

## 2011-05-14 DIAGNOSIS — Z9889 Other specified postprocedural states: Secondary | ICD-10-CM

## 2011-05-14 DIAGNOSIS — Z79899 Other long term (current) drug therapy: Secondary | ICD-10-CM | POA: Insufficient documentation

## 2011-05-14 DIAGNOSIS — I10 Essential (primary) hypertension: Secondary | ICD-10-CM | POA: Insufficient documentation

## 2011-05-14 DIAGNOSIS — E119 Type 2 diabetes mellitus without complications: Secondary | ICD-10-CM | POA: Insufficient documentation

## 2011-05-14 DIAGNOSIS — C50319 Malignant neoplasm of lower-inner quadrant of unspecified female breast: Secondary | ICD-10-CM

## 2011-05-14 DIAGNOSIS — J45909 Unspecified asthma, uncomplicated: Secondary | ICD-10-CM | POA: Insufficient documentation

## 2011-05-14 DIAGNOSIS — Z9012 Acquired absence of left breast and nipple: Secondary | ICD-10-CM

## 2011-05-14 DIAGNOSIS — E7800 Pure hypercholesterolemia, unspecified: Secondary | ICD-10-CM | POA: Insufficient documentation

## 2011-05-14 DIAGNOSIS — F341 Dysthymic disorder: Secondary | ICD-10-CM | POA: Insufficient documentation

## 2011-05-14 DIAGNOSIS — Z9010 Acquired absence of unspecified breast and nipple: Secondary | ICD-10-CM

## 2011-05-14 DIAGNOSIS — C50919 Malignant neoplasm of unspecified site of unspecified female breast: Secondary | ICD-10-CM

## 2011-05-14 DIAGNOSIS — Z170 Estrogen receptor positive status [ER+]: Secondary | ICD-10-CM

## 2011-05-14 DIAGNOSIS — R739 Hyperglycemia, unspecified: Secondary | ICD-10-CM

## 2011-05-14 DIAGNOSIS — Z853 Personal history of malignant neoplasm of breast: Secondary | ICD-10-CM | POA: Insufficient documentation

## 2011-05-14 LAB — CBC WITH DIFFERENTIAL/PLATELET
BASO%: 0.5 % (ref 0.0–2.0)
EOS%: 0 % (ref 0.0–7.0)
HCT: 39.8 % (ref 34.8–46.6)
LYMPH%: 15.8 % (ref 14.0–49.7)
MCH: 29.2 pg (ref 25.1–34.0)
MCHC: 33 g/dL (ref 31.5–36.0)
MCV: 88.4 fL (ref 79.5–101.0)
MONO%: 6 % (ref 0.0–14.0)
NEUT%: 77.7 % — ABNORMAL HIGH (ref 38.4–76.8)
Platelets: 178 10*3/uL (ref 145–400)

## 2011-05-14 LAB — GLUCOSE, CAPILLARY: Glucose-Capillary: 321 mg/dL — ABNORMAL HIGH (ref 70–99)

## 2011-05-14 LAB — COMPREHENSIVE METABOLIC PANEL
AST: 19 U/L (ref 0–37)
Alkaline Phosphatase: 129 U/L — ABNORMAL HIGH (ref 39–117)
BUN: 13 mg/dL (ref 6–23)
Calcium: 9.1 mg/dL (ref 8.4–10.5)
Chloride: 99 mEq/L (ref 96–112)
Creatinine, Ser: 0.75 mg/dL (ref 0.50–1.10)

## 2011-05-14 MED ORDER — SODIUM CHLORIDE 0.9 % IV SOLN
Freq: Once | INTRAVENOUS | Status: AC
  Administered 2011-05-14: 20:00:00 via INTRAVENOUS

## 2011-05-14 MED ORDER — METFORMIN HCL 500 MG PO TABS
500.0000 mg | ORAL_TABLET | Freq: Once | ORAL | Status: AC
  Administered 2011-05-14: 500 mg via ORAL
  Filled 2011-05-14: qty 1

## 2011-05-14 NOTE — ED Provider Notes (Signed)
History     CSN: [id number]  Arrival date & time 05/14/11  1740   First MD Initiated Contact with Patient 05/14/11 2017      No chief complaint on file.   (Consider location/radiation/quality/duration/timing/severity/associated sxs/prior treatment) Patient is a 71 y.o. female presenting with diabetes problem. The history is provided by the patient.  Diabetes (She is not currently having any symptoms and feels well, and at her baseline. She was seen for routine appointment this morning by Dr. Jamey Ripa for post-operative recheck. She was called this afternoon advising her that her blood sugar was significantly elevated and she needed emergency intervention at the hospital. ) Symptoms are stable. Current diabetic treatment includes oral agent (dual therapy). She is compliant with treatment most of the time. She is following a diabetic and generally healthy diet. Her home blood glucose trend is fluctuating minimally.    Past Medical History  Diagnosis Date  . DIABETES MELLITUS 08/10/2007  . 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 HYPERCHOLESTEROLEMIA 12/17/2006  . OBESITY 06/04/2008  . ANXIETY 06/04/2008  . DEPRESSION 08/10/2007  . HYPERTENSION 12/17/2006  . ASTHMATIC BRONCHITIS, ACUTE 06/04/2008  . ALLERGIC RHINITIS 12/13/2007  . GERD 12/17/2006  . DEGENERATIVE JOINT DISEASE 12/17/2006  . Asthma   . Blood transfusion 1964    post childbirth  . 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 Laminectomy [id number]    s/p-Dr. Jule Ser  . Bladder tack 1974  . Cholecystectomy 02/23/1991    [facility] - Dr Jamey Ripa  . Tonsillectomy   . Rotator cuff repair 8/03    right, Dr. Shelle Iron  . Enterocele repair 06/08    Dr. Estanislado Pandy  . Rotator cuff repair 10/26/2008    left, Dr. Rayburn Ma  . Hip surgery 08/27/2010    DR. Despina Hick  . Breast excisional biopsy 11/10/1988    left benign Dr Jamey Ripa  . Breast excisional biopsy 03/04/1983    Right - two areas - Dr Jamey Ripa  . Breast  excisional biopsy 10/09/1980    right - Dr Jamey Ripa  . Breast excisional biopsy 10/18/1979    left - Dr Jamey Ripa  . Breast excisional biopsy 01/02/1994    right - Dr Jamey Ripa  . Vaginal hysterectomy 1974    partial   . Colonoscopy   . Mastectomy     left  . Modified radical mastectomy w/ axillary lymph node dissection     03-30-11 LT  . Portacath placement 04/13/2011    Procedure: INSERTION PORT-A-CATH;  Surgeon: Currie Paris, MD;  Location: [facility];  Service: General;  Laterality: N/A;  porta cath placement,removal of J-P drain    Family History  Problem Relation Age of Onset  . COPD Mother   . Arthritis Mother   . Emphysema Mother   . Mental illness Paternal Grandfather   . Heart disease Other     Sibling  . Diabetes Other     Sibling   . Diabetes Brother   . Cancer Brother     prostate  . Cancer Father     malignant brain tumor    History  Substance Use Topics  . Smoking status: Never Smoker   . Smokeless tobacco: Never Used  . Alcohol Use: No    OB History    Grav Para Term Preterm Abortions TAB SAB Ect Mult Living   5 3        3  Review of Systems  Constitutional: Negative for fever and chills.  HENT: Negative.   Respiratory: Negative.   Cardiovascular: Negative.   Gastrointestinal: Negative.   Musculoskeletal: Negative.   Skin: Negative.   Neurological: Negative.     Allergies  Ceftriaxone sodium; Cephalexin; Clindamycin; Dilaudid; Oxycodone-acetaminophen; Rocephin; and Sulfonamide derivatives  Home Medications   Current Outpatient Rx  Name Route Sig Dispense Refill  . ACYCLOVIR 200 MG PO CAPS Oral Take 400 mg by mouth every 6 (six) hours. While awake.    Marland Kitchen AMLODIPINE BESYLATE 10 MG PO TABS Oral Take 1 tablet (10 mg total) by mouth daily. 30 tablet 11  . ATORVASTATIN CALCIUM 40 MG PO TABS Oral Take 1 tablet (40 mg total) by mouth daily. 30 tablet 11  . BENAZEPRIL HCL 40 MG PO TABS Oral Take 1 tablet (40 mg total) by mouth  daily. 30 tablet 11  . VITAMIN D 2000 UNITS PO TABS Oral Take 2,000 Units by mouth daily.      Marland Kitchen DEXAMETHASONE 4 MG PO TABS  Take 2 tablets by mouth once a day on the day after chemotherapy and then take 2 tablets two times a day for 2 days. Take with food. 30 tablet 1  . FLUCONAZOLE 100 MG PO TABS Oral Take 100 mg by mouth daily. #30 given 05/06/11 w/3RF-CTS    . HYDROCODONE-ACETAMINOPHEN 5-325 MG PO TABS Oral Take 1 tablet by mouth every 6 (six) hours as needed. Pain.    Marland Kitchen LIDOCAINE-PRILOCAINE 2.5-2.5 % EX CREA Topical Apply topically as needed. 30 g 3  . LORAZEPAM 0.5 MG PO TABS Oral Take 1 tablet (0.5 mg total) by mouth every 6 (six) hours as needed (Nausea or vomiting). 30 tablet 0  . METFORMIN HCL ER 500 MG PO TB24  TAKE ONE TABLET BY MOUTH TWICE DAILY 60 tablet 5  . OMEPRAZOLE 20 MG PO CPDR Oral Take 1 capsule (20 mg total) by mouth daily. 30 capsule 11  . ONDANSETRON HCL 8 MG PO TABS  Take 1 tablet two times a day as needed for nausea or vomiting starting on the third day after chemotherapy. 30 tablet 1  . PROCHLORPERAZINE MALEATE 10 MG PO TABS Oral Take 1 tablet (10 mg total) by mouth every 6 (six) hours as needed (Nausea or vomiting). 30 tablet 1  . SERTRALINE HCL 50 MG PO TABS Oral Take 1 tablet (50 mg total) by mouth daily. 30 tablet 5  . SITAGLIPTIN PHOSPHATE 50 MG PO TABS Oral Take 50 mg by mouth daily.    Marland Kitchen ALPRAZOLAM 0.5 MG PO TABS Oral Take 0.5 mg by mouth 3 (three) times daily as needed. 1/2-1 tablet three times a day as needed for nerves      BP 151/67  Pulse 70  Temp(Src) 98.4 F (36.9 C) (Oral)  Resp 18  SpO2 96%  Physical Exam  Constitutional: She appears well-developed and well-nourished.  HENT:  Head: Normocephalic.  Neck: Normal range of motion. Neck supple.  Cardiovascular: Normal rate and regular rhythm.   Pulmonary/Chest: Effort normal and breath sounds normal.  Abdominal: Soft. Bowel sounds are normal. There is no tenderness. There is no rebound and no  guarding.  Musculoskeletal: Normal range of motion.  Neurological: She is alert. No cranial nerve deficit.  Skin: Skin is warm and dry. No rash noted.  Psychiatric: She has a normal mood and affect.    ED Course  Procedures (including critical care time)  Labs Reviewed  GLUCOSE, CAPILLARY - Abnormal; Notable for the  following:    Glucose-Capillary 323 (*)    All other components within normal limits  GLUCOSE, CAPILLARY - Abnormal; Notable for the following:    Glucose-Capillary 321 (*)    All other components within normal limits   No results found.   No diagnosis found.  1. Hyperglycemia 2. Post-op dressing change  MDM  The patient is not having any symptoms of hyperglycemia. VSS, she feels well. CBG this a.m. 502, and 325 on arrival. She reports she did not take her morning Meformin. She was given that dose here. She also reports she was advised by Dr. Jamey Ripa to change her mastectomy bandage and packing twice daily and this was changed for her in the ED. She has in-home therapy arranged for further dressing changes at home. It is felt no other treatment is necessary and the patient is stable for discharge.        Rodena Medin, PA-C 05/14/11 2107

## 2011-05-14 NOTE — ED Notes (Signed)
Family at bedside. Pt given crackers. Fluid in progress.

## 2011-05-14 NOTE — ED Notes (Signed)
MD at bedside. 

## 2011-05-14 NOTE — Telephone Encounter (Signed)
This is my first phone call from [facility], who is a physical therapist working with patient through [facility]. Aware Dr Jamey Ripa just saw patient today re: wound and order was placed in [facility] and faxed to [facility] for nursing care of wound.

## 2011-05-14 NOTE — ED Provider Notes (Signed)
Medical screening examination/treatment/procedure(s) were performed by non-physician practitioner and as supervising physician I was immediately available for consultation/collaboration.   Gerhard Munch, MD 05/14/11 [phone]

## 2011-05-14 NOTE — ED Notes (Signed)
Pt sts she went to dr's appt this am for dressing change of left breast and lab work. She received a phone call from md's office to come to er because her blood sugar was over 500

## 2011-05-14 NOTE — Telephone Encounter (Signed)
Call from Lab- pt's glucose 502  Called pt asked how she was feeling. Relayed glucose 502. Pt advised she felt "fine" Dr. Everardo All who monitors pt's diabetes has prescribed pt to take Metformin 500 mg BID and Janumet 50 mg once daily. Pt advised she forgot to take her metformin this am as she was "running out of the house, then had to see all the doctors today".  Called Dr. Everardo All at [phone]- recording - office is currently closed. Labs reviewed with MD    Per MD -pt instructed to go to ED as this could be potentially dangerous. Pt verbalized understanding, confirmed she would go to ED.  Pt is scheduled for Chemotherapy tomorrow 05/15/11.

## 2011-05-14 NOTE — Telephone Encounter (Signed)
Wound care referral placed in patient's office visit and faxed to [facility] at [phone].

## 2011-05-14 NOTE — Progress Notes (Signed)
Chief complaint: Followup wound  History of present illness: This patient is status post mastectomy. On her last visit I debrided a small area of necrotic skin from the medial aspect of the wound. She's having trouble keeping this breast. She continues to have a bad odor from this.  Exam: Vital signs: Wound: There is a small bit of necrotic material still remaining in the wound which I debrided. This was less than a centimeter. There is some greenish superficial exudate on the wound which I wiped off easily with a 2 x 2. There is some granulation tissue underneath.  Impression: Slowly healing wound from some necrotic scan  Plan: She will tube b.i.d. wound changes. We will see if home health can come help with some of the dressing changes. I will see her back in two months.

## 2011-05-14 NOTE — Progress Notes (Signed)
OFFICE PROGRESS NOTE  CC: Hailey Mcalpine, MD, Dr. Blossom Hoops Streck Dr. Chipper Herb Dr. Romero Belling  DIAGNOSIS: Stage IIIA invasive lobular carcinoma, left breast.    PRIOR THERAPY: 1. Originally seen by Dr. Welton Flakes 03/05/2011 for invasive lobular carcinoma, left breast, ER/PR+, HER-2/neu negative . Stage IIB (T3 N0 M0). 2. Left breast mastectomy with axillary lymph node dissection 03/23/2011. Final pathology revealed a 6.0 cm invasive lobular carcinoma with lymphovascular invasion and perineural invasion. Invasive tumor was 2.9 cm from the nearest margin, lobular carcinoma in situ was noted. Initially 6 lymph nodes were positive for metastatic carcinoma. Tumor deposits were 0.5 0.8 0.7 0.7 0.3 and 1.0 cm focal extracapsular tumor extension was noted. Tumor was grade 3. The final lymph node count was 9 out of 16 positive for metastatic disease. Tumor was ER+100% PR+100% HER-2/neu -, ratio 1.31 proliferation marker 18% Final pathologic stage pT3a pN2a, Stage IIIA.  3. Began adjuvant chemotherapy with [name] 04/17/11  CURRENT THERAPY: Adjuvant chemotherapy, FEC 100 with Neulasta day 2. She will intially receive 4 cycles, followed by single agent taxol weekly x 12 weeks.  INTERVAL HISTORY: Today is lab check, returns for cycle 3 of chemotherapy tomorrow. Receives Neulasta day 2. Much less anxious today, states she feels well and that chemo "hasn't been bad at all". 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   Denies nausea or vomiting, has not required antiemetics, took only 1 pill to date. Tenderness at the port a cath site has improved, infusion was able to use the port for chemo on last round.  Shingles on buttocks have resolved. Now on maintenance dose of Acyclovir.    No fevers or chills. Reported gum tenderness on previous visit, using Biotine mouthwash with resolution. No ulcers. No headache or blurred vision. No infection. No cough or shortness of breath. No abdominal pain or new  bone pain. Bowel and bladder function are normal. Appetite is good, with adequate fluid intake. Remainder of the 10 point  review of systems is negative.  Receiving physical therapy for range of motion, left arm per Dr. Jamey Ripa. Also receiving wound care for left mastectomy site by [facility].   PHYSICAL EXAMINATION: General: Well developed, well nourished, in no acute distress.  EENT: No ocular or oral lesions. No stomatitis.  Respiratory: Lungs are clear to auscultation bilaterally with normal respiratory movement and no accessory muscle use. Cardiac: No murmur, rub or tachycardia. Rhythm is slightly irregular. No upper or lower extremity edema.  GI: Abdomen is soft, no palpable hepatosplenomegaly. No fluid wave. No tenderness. Musculoskeletal: No kyphosis, no tenderness over the spine, ribs or hips. Lymph: No cervical, infraclavicular, axillary or inguinal adenopathy. Neuro: No focal neurological deficits. Psych: Alert and oriented X 3 appropriate mood and affect.    ECOG PERFORMANCE STATUS: 1 - Symptomatic but completely ambulatory  Blood pressure 135/79, pulse 111, temperature 98.4 F (36.9 C), temperature source Oral, height 5\' 8"  (1.727 m), weight 184 lb 11.2 oz (83.779 kg).O2 sat 97%.  LABORATORY DATA: Lab Results  Component Value Date   WBC 7.8 05/14/2011   HGB 13.1 05/14/2011   HCT 39.8 05/14/2011   PLT 178 05/14/2011   ASSESSMENT: 71 year old female with: 1. Invasive lobular carcinoma of the left breast, on adjuvant chemo with good tolerance. Completed two cycles.  2. Mild stomatitis with first round, resolved. 3. Shingles, resolved. 4. Labs OK to treat.   PLAN:  1. She will need postmastectomy radiation therapy following chemo.  2. She will need  adjuvant anti-estrogen therapy with letrozole 2.5 mg daily after chemo.  3. Return 4/12 for chemo, return 4/13 for Neulasta injection. .  6. Follow-up 4/19 with Dr. Welton Flakes and last round of FEC 100, will begin 12 weeks of  Taxol.   All questions were answered. The patient knows to call the clinic with any problems, questions or concerns.

## 2011-05-14 NOTE — Discharge Instructions (Signed)
You can be discharged home safely and should continue your regular medication regimen as prescribed. Return here with any new concerns or symptoms.  Hyperglycemia Hyperglycemia occurs when the glucose (sugar) in your blood is too high. Hyperglycemia can happen for many reasons, but it most often happens to people who do not know they have diabetes or are not managing their diabetes properly.  CAUSES  Whether you have diabetes or not, there are other causes of hyperglycemia. Hyperglycemia can occur when you have diabetes, but it can also occur in other situations that you might not be as aware of, such as: Diabetes  If you have diabetes and are having problems controlling your blood glucose, hyperglycemia could occur because of some of the following reasons:   Not following your meal plan.   Not taking your diabetes medications or not taking it properly.   Exercising less or doing less activity than you normally do.   Being sick.  Pre-diabetes  This cannot be ignored. Before people develop Type 2 diabetes, they almost always have "pre-diabetes." This is when your blood glucose levels are higher than normal, but not yet high enough to be diagnosed as diabetes. Research has shown that some long-term damage to the body, especially the heart and circulatory system, may already be occurring during pre-diabetes. If you take action to manage your blood glucose when you have pre-diabetes, you may delay or prevent Type 2 diabetes from developing.  Stress  If you have diabetes, you may be "diet" controlled or on oral medications or insulin to control your diabetes. However, you may find that your blood glucose is higher than usual in the hospital whether you have diabetes or not. This is often referred to as "stress hyperglycemia." Stress can elevate your blood glucose. This happens because of hormones put out by the body during times of stress. If stress has been the cause of your high blood glucose, it  can be followed regularly by your caregiver. That way he/she can make sure your hyperglycemia does not continue to get worse or progress to diabetes.  Steroids  Steroids are medications that act on the infection fighting system (immune system) to block inflammation or infection. One side effect can be a rise in blood glucose. Most people can produce enough extra insulin to allow for this rise, but for those who cannot, steroids make blood glucose levels go even higher. It is not unusual for steroid treatments to "uncover" diabetes that is developing. It is not always possible to determine if the hyperglycemia will go away after the steroids are stopped. A special blood test called an A1c is sometimes done to determine if your blood glucose was elevated before the steroids were started.  SYMPTOMS  Thirsty.   Frequent urination.   Dry mouth.   Blurred vision.   Tired or fatigue.   Weakness.   Sleepy.   Tingling in feet or leg.  DIAGNOSIS  Diagnosis is made by monitoring blood glucose in one or all of the following ways:  A1c test. This is a chemical found in your blood.   Fingerstick blood glucose monitoring.   Laboratory results.  TREATMENT  First, knowing the cause of the hyperglycemia is important before the hyperglycemia can be treated. Treatment may include, but is not be limited to:  Education.   Change or adjustment in medications.   Change or adjustment in meal plan.   Treatment for an illness, infection, etc.   More frequent blood glucose monitoring.   Change  in exercise plan.   Decreasing or stopping steroids.   Lifestyle changes.  HOME CARE INSTRUCTIONS   Test your blood glucose as directed.   Exercise regularly. Your caregiver will give you instructions about exercise. Pre-diabetes or diabetes which comes on with stress is helped by exercising.   Eat wholesome, balanced meals. Eat often and at regular, fixed times. Your caregiver or nutritionist will  give you a meal plan to guide your sugar intake.   Being at an ideal weight is important. If needed, losing as little as 10 to 15 pounds may help improve blood glucose levels.  SEEK MEDICAL CARE IF:   You have questions about medicine, activity, or diet.   You continue to have symptoms (problems such as increased thirst, urination, or weight gain).  SEEK IMMEDIATE MEDICAL CARE IF:   You are vomiting or have diarrhea.   Your breath smells fruity.   You are breathing faster or slower.   You are very sleepy or incoherent.   You have numbness, tingling, or pain in your feet or hands.   You have chest pain.   Your symptoms get worse even though you have been following your caregiver's orders.   If you have any other questions or concerns.  Document Released: 07/15/2000 Document Revised: 01/08/2011 Document Reviewed: 09/10/2008 [facility] Patient Information 2012 [place], [place].

## 2011-05-14 NOTE — ED Notes (Signed)
Notified RN of CBG 321.

## 2011-05-14 NOTE — Patient Instructions (Signed)
The dressing should be changed twice daily. WE will see if home health can come to help with dressing changes

## 2011-05-15 ENCOUNTER — Ambulatory Visit (HOSPITAL_BASED_OUTPATIENT_CLINIC_OR_DEPARTMENT_OTHER): Payer: Medicare Other

## 2011-05-15 ENCOUNTER — Ambulatory Visit (INDEPENDENT_AMBULATORY_CARE_PROVIDER_SITE_OTHER): Payer: Medicare Other | Admitting: Endocrinology

## 2011-05-15 ENCOUNTER — Encounter: Payer: Self-pay | Admitting: Endocrinology

## 2011-05-15 VITALS — BP 128/76 | HR 86 | Temp 98.5°F | Wt 181.0 lb

## 2011-05-15 VITALS — BP 125/66 | HR 86 | Temp 97.1°F

## 2011-05-15 DIAGNOSIS — C773 Secondary and unspecified malignant neoplasm of axilla and upper limb lymph nodes: Secondary | ICD-10-CM

## 2011-05-15 DIAGNOSIS — C50519 Malignant neoplasm of lower-outer quadrant of unspecified female breast: Secondary | ICD-10-CM

## 2011-05-15 DIAGNOSIS — C50319 Malignant neoplasm of lower-inner quadrant of unspecified female breast: Secondary | ICD-10-CM

## 2011-05-15 DIAGNOSIS — Z170 Estrogen receptor positive status [ER+]: Secondary | ICD-10-CM

## 2011-05-15 DIAGNOSIS — E119 Type 2 diabetes mellitus without complications: Secondary | ICD-10-CM

## 2011-05-15 DIAGNOSIS — Z5111 Encounter for antineoplastic chemotherapy: Secondary | ICD-10-CM

## 2011-05-15 MED ORDER — SODIUM CHLORIDE 0.9 % IV SOLN
500.0000 mg/m2 | Freq: Once | INTRAVENOUS | Status: AC
  Administered 2011-05-15: 1040 mg via INTRAVENOUS
  Filled 2011-05-15: qty 52

## 2011-05-15 MED ORDER — SODIUM CHLORIDE 0.9 % IV SOLN
Freq: Once | INTRAVENOUS | Status: AC
  Administered 2011-05-15: 14:00:00 via INTRAVENOUS

## 2011-05-15 MED ORDER — FLUOROURACIL CHEMO INJECTION 2.5 GM/50ML
500.0000 mg/m2 | Freq: Once | INTRAVENOUS | Status: AC
  Administered 2011-05-15: 1050 mg via INTRAVENOUS
  Filled 2011-05-15: qty 21

## 2011-05-15 MED ORDER — DEXAMETHASONE SODIUM PHOSPHATE 4 MG/ML IJ SOLN
12.0000 mg | Freq: Once | INTRAMUSCULAR | Status: AC
  Administered 2011-05-15: 12 mg via INTRAVENOUS

## 2011-05-15 MED ORDER — PALONOSETRON HCL INJECTION 0.25 MG/5ML
0.2500 mg | Freq: Once | INTRAVENOUS | Status: AC
  Administered 2011-05-15: 0.25 mg via INTRAVENOUS

## 2011-05-15 MED ORDER — SODIUM CHLORIDE 0.9 % IJ SOLN
10.0000 mL | INTRAMUSCULAR | Status: DC | PRN
  Administered 2011-05-15: 10 mL
  Filled 2011-05-15: qty 10

## 2011-05-15 MED ORDER — EPIRUBICIN HCL CHEMO IV INJECTION 200 MG/100ML
100.0000 mg/m2 | Freq: Once | INTRAVENOUS | Status: AC
  Administered 2011-05-15: 208 mg via INTRAVENOUS
  Filled 2011-05-15: qty 104

## 2011-05-15 MED ORDER — SODIUM CHLORIDE 0.9 % IV SOLN
150.0000 mg | Freq: Once | INTRAVENOUS | Status: AC
  Administered 2011-05-15: 150 mg via INTRAVENOUS
  Filled 2011-05-15: qty 5

## 2011-05-15 MED ORDER — HEPARIN SOD (PORK) LOCK FLUSH 100 UNIT/ML IV SOLN
500.0000 [IU] | Freq: Once | INTRAVENOUS | Status: AC | PRN
  Administered 2011-05-15: 500 [IU]
  Filled 2011-05-15: qty 5

## 2011-05-15 NOTE — Patient Instructions (Addendum)
Take lantus, 15 units daily Please come back for a follow-up appointment in 1-2 weeks Call in 3 days if blood sugar does not come down to the 100's at some time of day check your blood sugar 2 times a day.  vary the time of day when you check, between before the 3 meals, and at bedtime.  also check if you have symptoms of your blood sugar being too high or too low.  please keep a record of the readings and bring it to your next appointment here.  please call us sooner if your blood sugar goes below 70, or if it stays over 200. Drink plenty of fluids.  Continue the 2 diabetes pills for now.

## 2011-05-15 NOTE — Progress Notes (Signed)
Subjective:    Patient ID: [name], female    DOB: 1940/04/02, 71 y.o.   MRN: [id number]  HPI Pt returns for f/u of type 2 DM (2007).  She takes 2 oral meds as rx'ed.  She intermittently back on steroids again.  She was seen in er yesterday with cbg of 500.  She takes 2 orals for DM.  She stopped the victoza, due to cost.   Past Medical History  Diagnosis Date  . DIABETES MELLITUS 08/10/2007  . 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 HYPERCHOLESTEROLEMIA 12/17/2006  . OBESITY 06/04/2008  . ANXIETY 06/04/2008  . DEPRESSION 08/10/2007  . HYPERTENSION 12/17/2006  . ASTHMATIC BRONCHITIS, ACUTE 06/04/2008  . ALLERGIC RHINITIS 12/13/2007  . GERD 12/17/2006  . DEGENERATIVE JOINT DISEASE 12/17/2006  . Asthma   . Blood transfusion 1964    post childbirth  . 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 Laminectomy [id number]    s/p-Dr. Jule Ser  . Bladder tack 1974  . Cholecystectomy 02/23/1991    [facility] - Dr Jamey Ripa  . Tonsillectomy   . Rotator cuff repair 8/03    right, Dr. Shelle Iron  . Enterocele repair 06/08    Dr. Estanislado Pandy  . Rotator cuff repair 10/26/2008    left, Dr. Rayburn Ma  . Hip surgery 08/27/2010    DR. Despina Hick  . Breast excisional biopsy 11/10/1988    left benign Dr Jamey Ripa  . Breast excisional biopsy 03/04/1983    Right - two areas - Dr Jamey Ripa  . Breast excisional biopsy 10/09/1980    right - Dr Jamey Ripa  . Breast excisional biopsy 10/18/1979    left - Dr Jamey Ripa  . Breast excisional biopsy 01/02/1994    right - Dr Jamey Ripa  . Vaginal hysterectomy 1974    partial   . Colonoscopy   . Mastectomy     left  . Modified radical mastectomy w/ axillary lymph node dissection     03-30-11 LT  . Portacath placement 04/13/2011    Procedure: INSERTION PORT-A-CATH;  Surgeon: Currie Paris, MD;  [facility]  [facility];  Service: General;  Laterality: N/A;  porta cath placement,removal of J-P drain    History   Social History  .  Marital Status: Married    Spouse Name: N/A    Number of Children: 3  . Years of Education: N/A   Occupational History  .     Social History Main Topics  . Smoking status: Never Smoker   . Smokeless tobacco: Never Used  . Alcohol Use: No  . Drug Use: No  . Sexually Active: Not on file   Other Topics Concern  . Not on file   Social History Narrative   Positive for second-hand smoke exposureNo exerciseNo caffeineDoes not work outside the home    Current Outpatient Prescriptions on File Prior to Visit  Medication Sig Dispense Refill  . acyclovir (ZOVIRAX) 200 MG capsule Take 400 mg by mouth every 6 (six) hours. While awake.      Marland Kitchen amLODipine (NORVASC) 10 MG tablet Take 1 tablet (10 mg total) by mouth daily.  30 tablet  11  . atorvastatin (LIPITOR) 40 MG tablet Take 1 tablet (40 mg total) by mouth daily.  30 tablet  11  . benazepril (LOTENSIN) 40 MG tablet Take 1 tablet (40 mg total) by mouth daily.  30 tablet  11  . Cholecalciferol (VITAMIN D) 2000 UNITS tablet  Take 2,000 Units by mouth daily.        Marland Kitchen dexamethasone (DECADRON) 4 MG tablet Take 2 tablets by mouth once a day on the day after chemotherapy and then take 2 tablets two times a day for 2 days. Take with food.  30 tablet  1  . fluconazole (DIFLUCAN) 100 MG tablet Take 100 mg by mouth daily. #30 given 05/06/11 w/3RF-CTS      . HYDROcodone-acetaminophen (NORCO) 5-325 MG per tablet Take 1 tablet by mouth every 6 (six) hours as needed. Pain.      . Insulin Glargine (LANTUS SOLOSTAR [place]) Inject 15 Units into the skin daily.      Marland Kitchen LORazepam (ATIVAN) 0.5 MG tablet Take 1 tablet (0.5 mg total) by mouth every 6 (six) hours as needed (Nausea or vomiting).  30 tablet  0  . metFORMIN (GLUCOPHAGE-XR) 500 MG 24 hr tablet TAKE ONE TABLET BY MOUTH TWICE DAILY  60 tablet  5  . omeprazole (PRILOSEC) 20 MG capsule Take 1 capsule (20 mg total) by mouth daily.  30 capsule  11  . ondansetron (ZOFRAN) 8 MG tablet Take 1 tablet two times a day as  needed for nausea or vomiting starting on the third day after chemotherapy.  30 tablet  1  . prochlorperazine (COMPAZINE) 10 MG tablet Take 1 tablet (10 mg total) by mouth every 6 (six) hours as needed (Nausea or vomiting).  30 tablet  1  . sertraline (ZOLOFT) 50 MG tablet Take 1 tablet (50 mg total) by mouth daily.  30 tablet  5  . sitaGLIPtin (JANUVIA) 50 MG tablet Take 50 mg by mouth daily.       No current facility-administered medications on file prior to visit.    Allergies  Allergen Reactions  . Ceftriaxone Sodium     REACTION: hives  . Cephalexin     REACTION: hives  . Clindamycin     REACTION: hives  . Dilaudid (Hydromorphone Hcl) Itching  . Oxycodone-Acetaminophen     REACTION: hives  . Rocephin (Ceftriaxone Sodium In Dextrose) Hives  . Sulfonamide Derivatives     REACTION: hives    Family History  Problem Relation Age of Onset  . COPD Mother   . Arthritis Mother   . Emphysema Mother   . Mental illness Paternal Grandfather   . Heart disease Other     Sibling  . Diabetes Other     Sibling   . Diabetes Brother   . Cancer Brother     prostate  . Cancer Father     malignant brain tumor    BP 128/76  Pulse 86  Temp(Src) 98.5 F (36.9 C) (Oral)  Wt 181 lb (82.101 kg)  SpO2 97%   Review of Systems Denies n/v.  She has slight doe    Objective:   Physical Exam VITAL SIGNS:  See vs page GENERAL: no distress SKIN:  Insulin injection sites at the anterior abdomen are normal   Lab Results  Component Value Date   HGBA1C 8.3* 02/06/2011      Assessment & Plan:  DM, needs increased rx.  i demonstrated lantus pen

## 2011-05-16 ENCOUNTER — Emergency Department (HOSPITAL_COMMUNITY)
Admission: EM | Admit: 2011-05-16 | Discharge: 2011-05-16 | Disposition: A | Discharge status: Home / Self Care | Payer: Medicare Other | Attending: Emergency Medicine | Admitting: Emergency Medicine

## 2011-05-16 ENCOUNTER — Encounter (HOSPITAL_COMMUNITY): Payer: Self-pay

## 2011-05-16 ENCOUNTER — Ambulatory Visit (HOSPITAL_BASED_OUTPATIENT_CLINIC_OR_DEPARTMENT_OTHER): Payer: Medicare Other

## 2011-05-16 VITALS — BP 132/75 | HR 98 | Temp 97.5°F

## 2011-05-16 DIAGNOSIS — C50319 Malignant neoplasm of lower-inner quadrant of unspecified female breast: Secondary | ICD-10-CM

## 2011-05-16 DIAGNOSIS — Z853 Personal history of malignant neoplasm of breast: Secondary | ICD-10-CM | POA: Insufficient documentation

## 2011-05-16 DIAGNOSIS — J45909 Unspecified asthma, uncomplicated: Secondary | ICD-10-CM | POA: Insufficient documentation

## 2011-05-16 DIAGNOSIS — I10 Essential (primary) hypertension: Secondary | ICD-10-CM | POA: Insufficient documentation

## 2011-05-16 DIAGNOSIS — C50519 Malignant neoplasm of lower-outer quadrant of unspecified female breast: Secondary | ICD-10-CM

## 2011-05-16 DIAGNOSIS — E7800 Pure hypercholesterolemia, unspecified: Secondary | ICD-10-CM | POA: Insufficient documentation

## 2011-05-16 DIAGNOSIS — E1169 Type 2 diabetes mellitus with other specified complication: Secondary | ICD-10-CM | POA: Insufficient documentation

## 2011-05-16 DIAGNOSIS — R739 Hyperglycemia, unspecified: Secondary | ICD-10-CM

## 2011-05-16 DIAGNOSIS — Z79899 Other long term (current) drug therapy: Secondary | ICD-10-CM | POA: Insufficient documentation

## 2011-05-16 DIAGNOSIS — F341 Dysthymic disorder: Secondary | ICD-10-CM | POA: Insufficient documentation

## 2011-05-16 DIAGNOSIS — K219 Gastro-esophageal reflux disease without esophagitis: Secondary | ICD-10-CM | POA: Insufficient documentation

## 2011-05-16 LAB — COMPREHENSIVE METABOLIC PANEL WITH GFR
ALT: 25 U/L (ref 0–35)
AST: 21 U/L (ref 0–37)
Albumin: 3 g/dL — ABNORMAL LOW (ref 3.5–5.2)
Alkaline Phosphatase: 123 U/L — ABNORMAL HIGH (ref 39–117)
BUN: 17 mg/dL (ref 6–23)
CO2: 24 meq/L (ref 19–32)
Calcium: 9.4 mg/dL (ref 8.4–10.5)
Chloride: 100 meq/L (ref 96–112)
Creatinine, Ser: 0.53 mg/dL (ref 0.50–1.10)
GFR calc Af Amer: >90 mL/min
GFR calc non Af Amer: >90 mL/min
Glucose, Bld: 294 mg/dL — ABNORMAL HIGH (ref 70–99)
Potassium: 3.8 meq/L (ref 3.5–5.1)
Sodium: 137 meq/L (ref 135–145)
Total Bilirubin: 0.2 mg/dL — ABNORMAL LOW (ref 0.3–1.2)
Total Protein: 5.8 g/dL — ABNORMAL LOW (ref 6.0–8.3)

## 2011-05-16 LAB — CBC
HCT: 37 % (ref 36.0–46.0)
Hemoglobin: 12.5 g/dL (ref 12.0–15.0)
MCH: 28.9 pg (ref 26.0–34.0)
MCHC: 33.8 g/dL (ref 30.0–36.0)
MCV: 85.5 fL (ref 78.0–100.0)
Platelets: 196 K/uL (ref 150–400)
RBC: 4.33 MIL/uL (ref 3.87–5.11)
RDW: 14.2 % (ref 11.5–15.5)
WBC: 23.3 K/uL — ABNORMAL HIGH (ref 4.0–10.5)

## 2011-05-16 LAB — URINALYSIS, ROUTINE W REFLEX MICROSCOPIC
Bilirubin Urine: NEGATIVE
Glucose, UA: >1000 mg/dL — AB
Hgb urine dipstick: NEGATIVE
Leukocytes, UA: NEGATIVE
Nitrite: NEGATIVE
Protein, ur: NEGATIVE mg/dL
Specific Gravity, Urine: 1.042 — ABNORMAL HIGH (ref 1.005–1.030)
Urobilinogen, UA: 0.2 mg/dL (ref 0.0–1.0)
pH: 6 (ref 5.0–8.0)

## 2011-05-16 LAB — URINE MICROSCOPIC-ADD ON

## 2011-05-16 LAB — GLUCOSE, CAPILLARY
Glucose-Capillary: 343 mg/dL — ABNORMAL HIGH (ref 70–99)
Glucose-Capillary: 279 mg/dL — ABNORMAL HIGH (ref 70–99)

## 2011-05-16 MED ORDER — PEGFILGRASTIM INJECTION 6 MG/0.6ML
6.0000 mg | Freq: Once | SUBCUTANEOUS | Status: AC
  Administered 2011-05-16: 6 mg via SUBCUTANEOUS

## 2011-05-16 MED ORDER — INSULIN ASPART 100 UNIT/ML ~~LOC~~ SOLN
8.0000 [IU] | Freq: Once | SUBCUTANEOUS | Status: AC
  Administered 2011-05-16: 8 [IU] via SUBCUTANEOUS
  Filled 2011-05-16: qty 8

## 2011-05-16 MED ORDER — CIPROFLOXACIN HCL 500 MG PO TABS: 500.0000 mg | ORAL_TABLET | Freq: Two times a day (BID) | ORAL | Status: AC

## 2011-05-16 MED ORDER — INSULIN REGULAR HUMAN 100 UNIT/ML IJ SOLN: 8.0000 [IU] | Freq: Once | INTRAMUSCULAR | Status: DC

## 2011-05-16 MED ORDER — SODIUM CHLORIDE 0.9 % IV BOLUS (SEPSIS)
1000.0000 mL | Freq: Once | INTRAVENOUS | Status: AC
  Administered 2011-05-16: 1000 mL via INTRAVENOUS

## 2011-05-16 MED ORDER — CIPROFLOXACIN HCL 500 MG PO TABS
500.0000 mg | ORAL_TABLET | Freq: Once | ORAL | Status: AC
  Administered 2011-05-16: 500 mg via ORAL
  Filled 2011-05-16: qty 1

## 2011-05-16 NOTE — Progress Notes (Signed)
CALLED TO ACCESS PT'S RIGHT CHEST PAC FOR IV USE. PT STATED HER PAC AREA IS VERY SORE AND REQUEST AN PIV BE STARTED IN HER HAND. A 20G CATHETER WAS PLACED IN THE RIGHT HAND WITH GOOD BLOOD RETURN. STAFF RN AWARE.

## 2011-05-16 NOTE — ED Notes (Signed)
279 on ED Glucometer performed by Deatra Robinson NS

## 2011-05-16 NOTE — Discharge Instructions (Signed)
The lab work shows a possible urine infection - take antibiotic as prescribed. A urine culture was sent the results of which will be back in 1-2 days - have your doctor follow up on those results. Continue diabetic medication, drink plenty of water, record blood sugars, and follow up with Dr Everardo All Monday to discuss diabetic medications. Return to ER if worse, persistent vomiting, severe abdominal pain, high fevers, weak/faint, other concern.      Hyperglycemia Hyperglycemia occurs when the glucose (sugar) in your blood is too high. Hyperglycemia can happen for many reasons, but it most often happens to people who do not know they have diabetes or are not managing their diabetes properly.  CAUSES  Whether you have diabetes or not, there are other causes of hyperglycemia. Hyperglycemia can occur when you have diabetes, but it can also occur in other situations that you might not be as aware of, such as: Diabetes  If you have diabetes and are having problems controlling your blood glucose, hyperglycemia could occur because of some of the following reasons:   Not following your meal plan.   Not taking your diabetes medications or not taking it properly.   Exercising less or doing less activity than you normally do.   Being sick.  Pre-diabetes  This cannot be ignored. Before people develop Type 2 diabetes, they almost always have "pre-diabetes." This is when your blood glucose levels are higher than normal, but not yet high enough to be diagnosed as diabetes. Research has shown that some long-term damage to the body, especially the heart and circulatory system, may already be occurring during pre-diabetes. If you take action to manage your blood glucose when you have pre-diabetes, you may delay or prevent Type 2 diabetes from developing.  Stress  If you have diabetes, you may be "diet" controlled or on oral medications or insulin to control your diabetes. However, you may find that your blood  glucose is higher than usual in the hospital whether you have diabetes or not. This is often referred to as "stress hyperglycemia." Stress can elevate your blood glucose. This happens because of hormones put out by the body during times of stress. If stress has been the cause of your high blood glucose, it can be followed regularly by your caregiver. That way he/she can make sure your hyperglycemia does not continue to get worse or progress to diabetes.  Steroids  Steroids are medications that act on the infection fighting system (immune system) to block inflammation or infection. One side effect can be a rise in blood glucose. Most people can produce enough extra insulin to allow for this rise, but for those who cannot, steroids make blood glucose levels go even higher. It is not unusual for steroid treatments to "uncover" diabetes that is developing. It is not always possible to determine if the hyperglycemia will go away after the steroids are stopped. A special blood test called an A1c is sometimes done to determine if your blood glucose was elevated before the steroids were started.  SYMPTOMS  Thirsty.   Frequent urination.   Dry mouth.   Blurred vision.   Tired or fatigue.   Weakness.   Sleepy.   Tingling in feet or leg.  DIAGNOSIS  Diagnosis is made by monitoring blood glucose in one or all of the following ways:  A1c test. This is a chemical found in your blood.   Fingerstick blood glucose monitoring.   Laboratory results.  TREATMENT  First, knowing the cause of  the hyperglycemia is important before the hyperglycemia can be treated. Treatment may include, but is not be limited to:  Education.   Change or adjustment in medications.   Change or adjustment in meal plan.   Treatment for an illness, infection, etc.   More frequent blood glucose monitoring.   Change in exercise plan.   Decreasing or stopping steroids.   Lifestyle changes.  HOME CARE INSTRUCTIONS    Test your blood glucose as directed.   Exercise regularly. Your caregiver will give you instructions about exercise. Pre-diabetes or diabetes which comes on with stress is helped by exercising.   Eat wholesome, balanced meals. Eat often and at regular, fixed times. Your caregiver or nutritionist will give you a meal plan to guide your sugar intake.   Being at an ideal weight is important. If needed, losing as little as 10 to 15 pounds may help improve blood glucose levels.  SEEK MEDICAL CARE IF:   You have questions about medicine, activity, or diet.   You continue to have symptoms (problems such as increased thirst, urination, or weight gain).  SEEK IMMEDIATE MEDICAL CARE IF:   You are vomiting or have diarrhea.   Your breath smells fruity.   You are breathing faster or slower.   You are very sleepy or incoherent.   You have numbness, tingling, or pain in your feet or hands.   You have chest pain.   Your symptoms get worse even though you have been following your caregiver's orders.   If you have any other questions or concerns.  Document Released: 07/15/2000 Document Revised: 01/08/2011 Document Reviewed: 09/10/2008 [facility] Patient Information 2012 [place], [place].      1800 Calorie Diabetic Diet The 1800 calorie diabetic diet is designed for eating up to 1800 calories each day. Following this diet and making healthy meal choices can help improve overall health. It controls blood glucose (sugar) levels, and it can also help lower blood pressure and cholesterol. SERVING SIZES Measuring foods and serving sizes helps to make sure you are getting the right amount of food. The list below tells how big or small some common serving sizes are:  1 oz.........4 stacked dice.   3 oz........Marland KitchenDeck of cards.   1 tsp.......Marland KitchenTip of little finger.   1 tbs......Marland KitchenMarland KitchenThumb.   2 tbs.......Marland KitchenGolf ball.    cup......Marland KitchenHalf of a fist.   1 cup.......Marland KitchenA fist.  GUIDELINES FOR CHOOSING  FOODS The goal of this diet is to eat a variety of foods and limit calories to 1800 each day. This can be done by choosing foods that are low in calories and fat. The diet also suggests eating small amounts of food frequently. Doing this helps control your blood glucose levels so they do not get too high or too low. Each meal or snack may include a protein food source to help you feel more satisfied. Try to eat about the same amount of food around the same time each day. This includes weekend days, travel days, and days off work. Space your meals about 4 to 5 hours apart, and add a snack between them, if you wish.  For example, a daily food plan could include breakfast, a morning snack, lunch, dinner, and an evening snack. Healthy meals and snacks have different types of foods, including whole grains, vegetables, fruits, lean meats, poultry, fish, and dairy products. As you plan your meals, select a variety of foods. Choose from the bread and starch, vegetable, fruit, dairy, and meat/protein groups. Examples of foods from  each group are listed below with their suggested serving sizes. Use measuring cups and spoons to become familiar with what a healthy portion looks like. Bread and Starch Each serving equals 15 grams of carbohydrates.  1 slice bread.    bagel.    cup cold cereal (unsweetened).    cup hot cereal or mashed potatoes.   1 small potato (size of a computer mouse).   ? cup cooked pasta or rice.    English muffin.   1 cup broth-based soup.   3 cups of popcorn.   4 to 6 whole-wheat crackers.    cup cooked beans, peas, or corn.  Vegetables Each serving equals 5 grams of carbohydrates.   cup cooked vegetables.   1 cup raw vegetables.    cup tomato or vegetable juice.  Fruit Each serving equals 15 grams of carbohydrates.  1 small apple or orange.   1  cup watermelon or strawberries.    cup applesauce (no sugar added).   2 tbs raisins.    banana.    cup  canned fruit, packed in water or in its own juice.    cup unsweetened fruit juice.  Dairy Each serving equals 12 to 15 grams of carbohydrates.  1 cup fat-free milk.   6 oz artificially sweetened yogurt or plain yogurt.   1 cup low-fat buttermilk.   1 cup soy milk.   1 cup almond milk.  Meat/Protein  1 large egg.   2 to 3 oz meat, poultry, or fish.    cup low-fat cottage cheese.   1 tbs peanut butter.   1 oz low-fat cheese.    cup tuna, packed in water.    cup tofu.  Fat  1 tsp oil.   1 tsp trans-fat-free margarine.   1 tsp butter.   1 tsp mayonnaise.   2 tbs avocado.   1 tbs salad dressing.   1 tbs cream cheese.   2 tbs sour cream.  SAMPLE 1800 CALORIE DIET PLAN Breakfast   cup unsweetened cereal (1 carb serving).   1 cup fat-free milk (1 carb serving).   1 slice whole-wheat toast (1 carb serving).    small banana (1 carb serving).   1 scrambled egg.   1 tsp trans-fat-free margarine.  Lunch  Tuna sandwich.   2 slices whole-wheat bread (2 carb servings).    cup canned tuna in water, drained.   1 tbs reduced fat mayonnaise.   1 stalk celery, chopped.   2 slices tomato.   1 lettuce leaf.   1 cup carrot sticks.   24 to 30 seedless grapes (2 carb servings).   6 oz light yogurt (1 carb serving).  Afternoon Snack  3 graham cracker squares (1 carb serving).   1 cup fat-free milk (1 carb serving).   1 tbs peanut butter.  Dinner  3 oz salmon, broiled with 1 tsp oil.   1 cup mashed potatoes (2 carb servings) with 1 tsp trans-fat-free margarine.   1 cup fresh or frozen green beans.   1 cup steamed asparagus.   1 cup fat-free milk (1 carb serving).  Evening Snack  3 cups of air-popped popcorn (1 carb serving).   2 tbs Parmesan cheese.  Meal Plan You can use this worksheet to help you make a daily meal plan based on the 1800 calorie diabetic diet suggestions. If you are using this plan to help you control your blood  glucose, you may interchange carbohydrate-containing foods (dairy, starches, and fruits).  Select a variety of fresh foods of varying colors and flavors. The total amount of carbohydrate in your meals or snacks is more important than making sure you include all of the food groups every time you eat. Choose from the approximate amount of the following foods to build your day's meals:  8 Starches.   4 Vegetables.   3 Fruits.   2 Dairy.   6 to 7 oz Meat/Protein.   Up to 4 Fats.  Your dietician can use this worksheet to help you decide how many servings and which types of foods are right for you. BREAKFAST Food Group and Servings / Food Choice Starches _______________________________________________________ Dairy __________________________________________________________ Fruit ___________________________________________________________ Meat/Protein ____________________________________________________ Fat ____________________________________________________________ LUNCH Food Group and Servings / Food Choice Starch _________________________________________________________ Meat/Protein ___________________________________________________ Vegetables _____________________________________________________ Fruit __________________________________________________________ Dairy __________________________________________________________ Fat ____________________________________________________________ Aura Fey Food Group and Servings / Food Choice Starch ________________________________________________________ Meat/Protein ___________________________________________________ Fruit __________________________________________________________ Dairy __________________________________________________________ Laural Golden Food Group and Servings / Food Choice Starches _______________________________________________________ Meat/Protein ___________________________________________________ Dairy  __________________________________________________________ Vegetable ______________________________________________________ Fruit ___________________________________________________________ Fat ____________________________________________________________ Lollie Sails Food Group and Servings / Food Choice Fruit __________________________________________________________ Meat/Protein ___________________________________________________ Dairy __________________________________________________________ Starch _________________________________________________________ DAILY TOTALS Starches _________________________ Vegetables _______________________ Fruits ____________________________ Dairy ____________________________ Meat/Protein_____________________ Fats _____________________________ Document Released: 08/11/2004 Document Revised: 01/08/2011 Document Reviewed: 12/05/2010 [employer] Patient Information 2012 [place], [place].

## 2011-05-16 NOTE — ED Notes (Signed)
Pt in from home states elevated blood sugars over 400 all day pt states chemo tx  Yesterday for CA of left breast

## 2011-05-16 NOTE — ED Notes (Signed)
Pt refuse portacath to be access prefer a peripheral line unable to get blood at this time pt c/o pain

## 2011-05-16 NOTE — ED Provider Notes (Signed)
History     CSN: [id number]  Arrival date & time 05/16/11  1538   First MD Initiated Contact with Patient 05/16/11 [phone]      Chief Complaint  Patient presents with  . Hyperglycemia    @1400 -473    (Consider location/radiation/quality/duration/timing/severity/associated sxs/prior treatment) The history is provided by the patient.  pt w hx niddm, presents w high blood sugars. States in getting chemo for breast ca, last had yesterday. Has been on decadron. States sugars in 400s range. +polyuria and polydipsia. No nvd. No abd pain. No dysuria. No fever or chills. States saw pcp yesteray, ellison, who put on insulin 15 units in am. Denies other change in meds. No faintness or dizziness. Denies specific exacerbating or alleviating factors. Denies pain.   Past Medical History  Diagnosis Date  . DIABETES MELLITUS 08/10/2007  . 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 HYPERCHOLESTEROLEMIA 12/17/2006  . OBESITY 06/04/2008  . ANXIETY 06/04/2008  . DEPRESSION 08/10/2007  . HYPERTENSION 12/17/2006  . ASTHMATIC BRONCHITIS, ACUTE 06/04/2008  . ALLERGIC RHINITIS 12/13/2007  . GERD 12/17/2006  . DEGENERATIVE JOINT DISEASE 12/17/2006  . Asthma   . Blood transfusion 1964    post childbirth  . 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 Laminectomy [id number]    s/p-Dr. Jule Ser  . Bladder tack 1974  . Cholecystectomy 02/23/1991    [facility] - Dr Jamey Ripa  . Tonsillectomy   . Rotator cuff repair 8/03    right, Dr. Shelle Iron  . Enterocele repair 06/08    Dr. Estanislado Pandy  . Rotator cuff repair 10/26/2008    left, Dr. Rayburn Ma  . Hip surgery 08/27/2010    DR. Despina Hick  . Breast excisional biopsy 11/10/1988    left benign Dr Jamey Ripa  . Breast excisional biopsy 03/04/1983    Right - two areas - Dr Jamey Ripa  . Breast excisional biopsy 10/09/1980    right - Dr Jamey Ripa  . Breast excisional biopsy 10/18/1979    left - Dr Jamey Ripa  . Breast excisional biopsy 01/02/1994    right - Dr  Jamey Ripa  . Vaginal hysterectomy 1974    partial   . Colonoscopy   . Mastectomy     left  . Modified radical mastectomy w/ axillary lymph node dissection     03-30-11 LT  . Portacath placement 04/13/2011    Procedure: INSERTION PORT-A-CATH;  Surgeon: Currie Paris, MD;  Location: [facility];  Service: General;  Laterality: N/A;  porta cath placement,removal of J-P drain    Family History  Problem Relation Age of Onset  . COPD Mother   . Arthritis Mother   . Emphysema Mother   . Mental illness Paternal Grandfather   . Heart disease Other     Sibling  . Diabetes Other     Sibling   . Diabetes Brother   . Cancer Brother     prostate  . Cancer Father     malignant brain tumor    History  Substance Use Topics  . Smoking status: Never Smoker   . Smokeless tobacco: Never Used  . Alcohol Use: No    OB History    Grav Para Term Preterm Abortions TAB SAB Ect Mult Living   5 3        3       Review of Systems  Constitutional: Negative for fever and chills.  HENT: Negative for sore throat and neck  pain.   Eyes: Negative for visual disturbance.  Respiratory: Negative for cough.   Cardiovascular: Negative for chest pain and leg swelling.  Gastrointestinal: Negative for abdominal pain.  Genitourinary: Negative for dysuria.  Musculoskeletal: Negative for back pain.  Skin: Negative for rash.  Neurological: Negative for headaches.  Hematological: Does not bruise/bleed easily.  Psychiatric/Behavioral: Negative for confusion.    Allergies  Ceftriaxone sodium; Cephalexin; Clindamycin; Dilaudid; Oxycodone-acetaminophen; Rocephin; and Sulfonamide derivatives  Home Medications   Current Outpatient Rx  Name Route Sig Dispense Refill  . ACYCLOVIR 200 MG PO CAPS Oral Take 400 mg by mouth every 6 (six) hours. While awake.    Marland Kitchen AMLODIPINE BESYLATE 10 MG PO TABS Oral Take 1 tablet (10 mg total) by mouth daily. 30 tablet 11  . ATORVASTATIN CALCIUM 40 MG PO TABS Oral  Take 1 tablet (40 mg total) by mouth daily. 30 tablet 11  . BENAZEPRIL HCL 40 MG PO TABS Oral Take 1 tablet (40 mg total) by mouth daily. 30 tablet 11  . VITAMIN D 2000 UNITS PO TABS Oral Take 2,000 Units by mouth daily.      Marland Kitchen DEXAMETHASONE 4 MG PO TABS  Take 2 tablets by mouth once a day on the day after chemotherapy and then take 2 tablets two times a day for 2 days. Take with food. 30 tablet 1  . FLUCONAZOLE 100 MG PO TABS Oral Take 100 mg by mouth daily. #30 given 05/06/11 w/3RF-CTS    . HYDROCODONE-ACETAMINOPHEN 5-325 MG PO TABS Oral Take 1 tablet by mouth every 6 (six) hours as needed. Pain.    Marland Kitchen LANTUS SOLOSTAR [place] Subcutaneous Inject 15 Units into the skin daily.    Marland Kitchen LIDOCAINE-PRILOCAINE 2.5-2.5 % EX CREA Topical Apply 1 application topically as needed. For pain    . LORAZEPAM 0.5 MG PO TABS Oral Take 1 tablet (0.5 mg total) by mouth every 6 (six) hours as needed (Nausea or vomiting). 30 tablet 0  . METFORMIN HCL ER 500 MG PO TB24  TAKE ONE TABLET BY MOUTH TWICE DAILY 60 tablet 5  . OMEPRAZOLE 20 MG PO CPDR Oral Take 1 capsule (20 mg total) by mouth daily. 30 capsule 11  . ONDANSETRON HCL 8 MG PO TABS  Take 1 tablet two times a day as needed for nausea or vomiting starting on the third day after chemotherapy. 30 tablet 1  . PROCHLORPERAZINE MALEATE 10 MG PO TABS Oral Take 1 tablet (10 mg total) by mouth every 6 (six) hours as needed (Nausea or vomiting). 30 tablet 1  . SERTRALINE HCL 50 MG PO TABS Oral Take 1 tablet (50 mg total) by mouth daily. 30 tablet 5  . SITAGLIPTIN PHOSPHATE 50 MG PO TABS Oral Take 50 mg by mouth daily.      BP 152/67  Pulse 91  Temp(Src) 97.6 F (36.4 C) (Oral)  Resp 16  SpO2 97%  Physical Exam  Nursing note and vitals reviewed. Constitutional: She is oriented to person, place, and time. She appears well-developed and well-nourished. No distress.  HENT:  Mouth/Throat: Oropharynx is clear and moist.  Eyes: Conjunctivae are normal. No scleral icterus.    Neck: Neck supple. No tracheal deviation present.  Cardiovascular: Normal rate, regular rhythm, normal heart sounds and intact distal pulses.   Pulmonary/Chest: Effort normal and breath sounds normal. No respiratory distress.       portacath site right chest non tender, no erythema  Abdominal: Soft. Normal appearance and bowel sounds are normal. She exhibits no  distension and no mass. There is no tenderness. There is no rebound and no guarding.  Genitourinary:       No cva tenderness  Musculoskeletal: She exhibits no edema and no tenderness.  Neurological: She is alert and oriented to person, place, and time.  Skin: Skin is warm and dry. No rash noted. She is not diaphoretic.  Psychiatric: She has a normal mood and affect.    ED Course  Procedures (including critical care time)  Labs Reviewed  GLUCOSE, CAPILLARY - Abnormal; Notable for the following:    Glucose-Capillary 343 (*)    All other components within normal limits  COMPREHENSIVE METABOLIC PANEL  CBC  URINALYSIS, ROUTINE W REFLEX MICROSCOPIC   Results for orders placed during the hospital encounter of 05/16/11  GLUCOSE, CAPILLARY      Component Value Range   Glucose-Capillary 343 (*) 70 - 99 (mg/dL)  COMPREHENSIVE METABOLIC PANEL      Component Value Range   Sodium 137  135 - 145 (mEq/L)   Potassium 3.8  3.5 - 5.1 (mEq/L)   Chloride 100  96 - 112 (mEq/L)   CO2 24  19 - 32 (mEq/L)   Glucose, Bld 294 (*) 70 - 99 (mg/dL)   BUN 17  6 - 23 (mg/dL)   Creatinine, Ser 1.61  0.50 - 1.10 (mg/dL)   Calcium 9.4  8.4 - 09.6 (mg/dL)   Total Protein 5.8 (*) 6.0 - 8.3 (g/dL)   Albumin 3.0 (*) 3.5 - 5.2 (g/dL)   AST 21  0 - 37 (U/L)   ALT 25  0 - 35 (U/L)   Alkaline Phosphatase 123 (*) 39 - 117 (U/L)   Total Bilirubin 0.2 (*) 0.3 - 1.2 (mg/dL)   GFR calc non Af Amer >90  >90 (mL/min)   GFR calc Af Amer >90  >90 (mL/min)  CBC      Component Value Range   WBC 23.3 (*) 4.0 - 10.5 (K/uL)   RBC 4.33  3.87 - 5.11 (MIL/uL)    Hemoglobin 12.5  12.0 - 15.0 (g/dL)   HCT 04.5  40.9 - 81.1 (%)   MCV 85.5  78.0 - 100.0 (fL)   MCH 28.9  26.0 - 34.0 (pg)   MCHC 33.8  30.0 - 36.0 (g/dL)   RDW 91.4  78.2 - 95.6 (%)   Platelets 196  150 - 400 (K/uL)  URINALYSIS, ROUTINE W REFLEX MICROSCOPIC      Component Value Range   Color, Urine YELLOW  YELLOW    APPearance CLEAR  CLEAR    Specific Gravity, Urine 1.042 (*) 1.005 - 1.030    pH 6.0  5.0 - 8.0    Glucose, UA >1000 (*) NEGATIVE (mg/dL)   Hgb urine dipstick NEGATIVE  NEGATIVE    Bilirubin Urine NEGATIVE  NEGATIVE    Ketones, ur TRACE (*) NEGATIVE (mg/dL)   Protein, ur NEGATIVE  NEGATIVE (mg/dL)   Urobilinogen, UA 0.2  0.0 - 1.0 (mg/dL)   Nitrite NEGATIVE  NEGATIVE    Leukocytes, UA NEGATIVE  NEGATIVE   URINE MICROSCOPIC-ADD ON      Component Value Range   Squamous Epithelial / LPF RARE  RARE    WBC, UA 7-10  <3 (WBC/hpf)   RBC / HPF 0-2  <3 (RBC/hpf)  GLUCOSE, CAPILLARY      Component Value Range   Glucose-Capillary 279 (*) 70 - 99 (mg/dL)   Comment 1 Notify RN         MDM  Iv ns  bolus. Labs.   Reviewed nursing notes, prior charts.   2 liters ns in ed. novolog 8 units sq. Recheck pt comfortable. Possible uti on labs. No nv. No abd or flank pain. No fever/chills.  Will culture urine and rx. Allergy ceph and sulfa noted. cipro po.  Wbc elevated, pt appears well, no toxic, no fever/chills, is noted on steroid rx and recently given neulasta injection.   Continues to feel well on recheck. No fever. No nv.          Suzi Roots, MD 05/16/11 2039

## 2011-05-17 LAB — URINE CULTURE

## 2011-05-18 ENCOUNTER — Telehealth (INDEPENDENT_AMBULATORY_CARE_PROVIDER_SITE_OTHER): Payer: Self-pay | Admitting: General Surgery

## 2011-05-18 ENCOUNTER — Telehealth: Payer: Self-pay

## 2011-05-18 NOTE — Telephone Encounter (Signed)
Pt called requesting medication adjustment by Dr Everardo All, please advise.

## 2011-05-18 NOTE — Telephone Encounter (Signed)
Received FAX request for pain meds.  Called in Hydrocodone 5/325 mg, # 30, 1 po Q4-6H, NO refill to answering machine, per the standing order protocol.

## 2011-05-18 NOTE — ED Notes (Signed)
+   Urine Chart sent to EDP office for review. 

## 2011-05-18 NOTE — Telephone Encounter (Signed)
[facility] Triage Call Report Triage Record Num: [id number] Operator: Merlinda Frederick Patient Name: [name] Call Date & Time: 05/16/2011 2:44:39PM Patient Phone: [phone] PCP: Romero Belling Patient Gender: Female PCP Fax : [phone] Patient DOB: 12-Oct-1940 Practice Name: [facility] Reason for Call: Caller: Janet Berlin; PCP: Romero Belling; CB#: [phone]; Call regarding Sugar Concerns; Pt calling 4/13/about blood sugar 453. Pt takes Metformin, Jenuvia, 15 units of Novolog Insulin. Triaged per Diabeter: Control Problems Guideline, ED disp for "signs and symptoms of ketoacidosis and blood sugar more than 300mg /dl. Pt will go to [facility] ED immediately for further evaluation. Protocol(s) Used: Diabetes: Control Problems Recommended Outcome per Protocol: See ED Immediately Reason for Outcome: Signs and symptoms of ketoacidosis AND blood sugar more than 300 mg/dl Care Advice: ~ Another adult should drive. 05/16/2011 2:49:29PM Page 1 of 1 [email or web address]

## 2011-05-20 ENCOUNTER — Telehealth (INDEPENDENT_AMBULATORY_CARE_PROVIDER_SITE_OTHER): Payer: Self-pay | Admitting: General Surgery

## 2011-05-20 ENCOUNTER — Encounter (INDEPENDENT_AMBULATORY_CARE_PROVIDER_SITE_OTHER): Payer: Medicare Other | Admitting: Surgery

## 2011-05-20 NOTE — Telephone Encounter (Signed)
Patient has not heard anything from [facility] about wound care. I told patient I would call and check on this. [facility] stated nurse called and left message on Monday and never heard back from patient. I called patient and made her aware I have contacted them again. She will be on the look out for this call.

## 2011-05-21 NOTE — ED Notes (Signed)
Chart back from EDP office (Cipro 500mg  2x daily is effective)

## 2011-05-22 ENCOUNTER — Emergency Department (HOSPITAL_COMMUNITY): Payer: Medicare Other

## 2011-05-22 ENCOUNTER — Other Ambulatory Visit (HOSPITAL_BASED_OUTPATIENT_CLINIC_OR_DEPARTMENT_OTHER): Payer: Medicare Other | Admitting: Lab

## 2011-05-22 ENCOUNTER — Encounter (HOSPITAL_COMMUNITY): Payer: Self-pay | Admitting: *Deleted

## 2011-05-22 ENCOUNTER — Encounter: Payer: Self-pay | Admitting: Oncology

## 2011-05-22 ENCOUNTER — Ambulatory Visit (HOSPITAL_BASED_OUTPATIENT_CLINIC_OR_DEPARTMENT_OTHER): Payer: Medicare Other | Admitting: Oncology

## 2011-05-22 ENCOUNTER — Inpatient Hospital Stay (HOSPITAL_COMMUNITY)
Admission: EM | Admit: 2011-05-22 | Discharge: 2011-05-28 | DRG: 919 | Disposition: A | Discharge status: Home Health | Payer: Medicare Other | Attending: Internal Medicine | Admitting: Internal Medicine

## 2011-05-22 VITALS — BP 145/71 | HR 94 | Temp 97.8°F | Ht 68.0 in | Wt 185.1 lb

## 2011-05-22 DIAGNOSIS — D61818 Other pancytopenia: Secondary | ICD-10-CM | POA: Diagnosis present

## 2011-05-22 DIAGNOSIS — R5381 Other malaise: Secondary | ICD-10-CM

## 2011-05-22 DIAGNOSIS — E119 Type 2 diabetes mellitus without complications: Secondary | ICD-10-CM

## 2011-05-22 DIAGNOSIS — C50319 Malignant neoplasm of lower-inner quadrant of unspecified female breast: Secondary | ICD-10-CM

## 2011-05-22 DIAGNOSIS — E860 Dehydration: Secondary | ICD-10-CM

## 2011-05-22 DIAGNOSIS — J209 Acute bronchitis, unspecified: Secondary | ICD-10-CM | POA: Diagnosis present

## 2011-05-22 DIAGNOSIS — C50312 Malignant neoplasm of lower-inner quadrant of left female breast: Secondary | ICD-10-CM | POA: Diagnosis present

## 2011-05-22 DIAGNOSIS — J45909 Unspecified asthma, uncomplicated: Secondary | ICD-10-CM | POA: Diagnosis present

## 2011-05-22 DIAGNOSIS — R5383 Other fatigue: Secondary | ICD-10-CM

## 2011-05-22 DIAGNOSIS — T451X5A Adverse effect of antineoplastic and immunosuppressive drugs, initial encounter: Secondary | ICD-10-CM | POA: Diagnosis present

## 2011-05-22 DIAGNOSIS — T8149XA Infection following a procedure, other surgical site, initial encounter: Secondary | ICD-10-CM

## 2011-05-22 DIAGNOSIS — Z9010 Acquired absence of unspecified breast and nipple: Secondary | ICD-10-CM

## 2011-05-22 DIAGNOSIS — D703 Neutropenia due to infection: Secondary | ICD-10-CM

## 2011-05-22 DIAGNOSIS — F411 Generalized anxiety disorder: Secondary | ICD-10-CM | POA: Diagnosis present

## 2011-05-22 DIAGNOSIS — Y836 Removal of other organ (partial) (total) as the cause of abnormal reaction of the patient, or of later complication, without mention of misadventure at the time of the procedure: Secondary | ICD-10-CM | POA: Diagnosis present

## 2011-05-22 DIAGNOSIS — C50919 Malignant neoplasm of unspecified site of unspecified female breast: Secondary | ICD-10-CM

## 2011-05-22 DIAGNOSIS — R638 Other symptoms and signs concerning food and fluid intake: Secondary | ICD-10-CM | POA: Diagnosis present

## 2011-05-22 DIAGNOSIS — F329 Major depressive disorder, single episode, unspecified: Secondary | ICD-10-CM | POA: Diagnosis present

## 2011-05-22 DIAGNOSIS — B379 Candidiasis, unspecified: Secondary | ICD-10-CM | POA: Diagnosis present

## 2011-05-22 DIAGNOSIS — C773 Secondary and unspecified malignant neoplasm of axilla and upper limb lymph nodes: Secondary | ICD-10-CM

## 2011-05-22 DIAGNOSIS — E876 Hypokalemia: Secondary | ICD-10-CM | POA: Diagnosis not present

## 2011-05-22 DIAGNOSIS — K219 Gastro-esophageal reflux disease without esophagitis: Secondary | ICD-10-CM | POA: Diagnosis present

## 2011-05-22 DIAGNOSIS — F3289 Other specified depressive episodes: Secondary | ICD-10-CM | POA: Diagnosis present

## 2011-05-22 DIAGNOSIS — E7800 Pure hypercholesterolemia, unspecified: Secondary | ICD-10-CM | POA: Diagnosis present

## 2011-05-22 DIAGNOSIS — F341 Dysthymic disorder: Secondary | ICD-10-CM | POA: Diagnosis present

## 2011-05-22 DIAGNOSIS — L089 Local infection of the skin and subcutaneous tissue, unspecified: Secondary | ICD-10-CM

## 2011-05-22 DIAGNOSIS — T8189XA Other complications of procedures, not elsewhere classified, initial encounter: Principal | ICD-10-CM | POA: Diagnosis present

## 2011-05-22 DIAGNOSIS — I10 Essential (primary) hypertension: Secondary | ICD-10-CM

## 2011-05-22 DIAGNOSIS — S21002A Unspecified open wound of left breast, initial encounter: Secondary | ICD-10-CM

## 2011-05-22 DIAGNOSIS — D709 Neutropenia, unspecified: Secondary | ICD-10-CM | POA: Diagnosis present

## 2011-05-22 LAB — CBC WITH DIFFERENTIAL/PLATELET
Basophils Absolute: 0 10*3/uL (ref 0.0–0.1)
EOS%: 0.3 % (ref 0.0–7.0)
Eosinophils Absolute: 0 10*3/uL (ref 0.0–0.5)
HCT: 34.7 % — ABNORMAL LOW (ref 34.8–46.6)
HGB: 11.4 g/dL — ABNORMAL LOW (ref 11.6–15.9)
MONO#: 0 10*3/uL — ABNORMAL LOW (ref 0.1–0.9)
NEUT#: 0 10*3/uL — CL (ref 1.5–6.5)
RDW: 14.2 % (ref 11.2–14.5)
lymph#: 0.4 10*3/uL — ABNORMAL LOW (ref 0.9–3.3)

## 2011-05-22 LAB — LACTIC ACID, PLASMA: Lactic Acid, Venous: 1.7 mmol/L (ref 0.5–2.2)

## 2011-05-22 LAB — COMPREHENSIVE METABOLIC PANEL
AST: 11 U/L (ref 0–37)
Albumin: 3.5 g/dL (ref 3.5–5.2)
Alkaline Phosphatase: 97 U/L (ref 39–117)
CO2: 28 mEq/L (ref 19–32)
CO2: 28 mEq/L (ref 19–32)
Calcium: 9.3 mg/dL (ref 8.4–10.5)
Calcium: 8.4 mg/dL (ref 8.4–10.5)
Chloride: 99 mEq/L (ref 96–112)
Creatinine, Ser: 0.5 mg/dL (ref 0.50–1.10)
GFR calc Af Amer: >90 mL/min (ref >90)
GFR calc non Af Amer: >90 mL/min (ref >90)
Glucose, Bld: 261 mg/dL — ABNORMAL HIGH (ref 70–99)
Glucose, Bld: 208 mg/dL — ABNORMAL HIGH (ref 70–99)
Potassium: 3.6 mEq/L (ref 3.5–5.3)
Sodium: 135 mEq/L (ref 135–145)
Total Protein: 5.7 g/dL — ABNORMAL LOW (ref 6.0–8.3)

## 2011-05-22 LAB — DIFFERENTIAL
Basophils Absolute: 0 10*3/uL (ref 0.0–0.1)
Eosinophils Absolute: 0 10*3/uL (ref 0.0–0.7)
Lymphocytes Relative: 0 % — ABNORMAL LOW (ref 12–46)
Monocytes Relative: 0 % — ABNORMAL LOW (ref 3–12)

## 2011-05-22 LAB — PHOSPHORUS: Phosphorus: 1.9 mg/dL — ABNORMAL LOW (ref 2.3–4.6)

## 2011-05-22 LAB — APTT: aPTT: 23 seconds — ABNORMAL LOW (ref 24–37)

## 2011-05-22 MED ORDER — INSULIN ASPART 100 UNIT/ML ~~LOC~~ SOLN: 0.0000 [IU] | Freq: Every day | SUBCUTANEOUS | Status: DC

## 2011-05-22 MED ORDER — SODIUM CHLORIDE 0.9 % IV SOLN
INTRAVENOUS | Status: DC
  Administered 2011-05-22: 20:00:00 via INTRAVENOUS
  Administered 2011-05-23: 250 mL via INTRAVENOUS
  Administered 2011-05-24 – 2011-05-25 (×3): via INTRAVENOUS

## 2011-05-22 MED ORDER — ONDANSETRON HCL 4 MG/2ML IJ SOLN
4.0000 mg | Freq: Four times a day (QID) | INTRAMUSCULAR | Status: DC | PRN
  Administered 2011-05-24: 4 mg via INTRAVENOUS
  Filled 2011-05-22: qty 2

## 2011-05-22 MED ORDER — SERTRALINE HCL 50 MG PO TABS
50.0000 mg | ORAL_TABLET | Freq: Every day | ORAL | Status: DC
  Administered 2011-05-22 – 2011-05-28 (×7): 50 mg via ORAL
  Filled 2011-05-22 (×7): qty 1

## 2011-05-22 MED ORDER — SODIUM CHLORIDE 0.9 % IV SOLN
500.0000 mg | Freq: Four times a day (QID) | INTRAVENOUS | Status: DC
  Administered 2011-05-23 – 2011-05-27 (×18): 500 mg via INTRAVENOUS
  Filled 2011-05-22 (×20): qty 500

## 2011-05-22 MED ORDER — INSULIN GLARGINE 100 UNIT/ML ~~LOC~~ SOLN
15.0000 [IU] | Freq: Every day | SUBCUTANEOUS | Status: DC
  Administered 2011-05-22 – 2011-05-28 (×7): 15 [IU] via SUBCUTANEOUS
  Filled 2011-05-22: qty 1

## 2011-05-22 MED ORDER — LORAZEPAM 0.5 MG PO TABS: 0.5000 mg | ORAL_TABLET | Freq: Four times a day (QID) | ORAL | Status: DC | PRN

## 2011-05-22 MED ORDER — ONDANSETRON HCL 4 MG PO TABS: 4.0000 mg | ORAL_TABLET | Freq: Four times a day (QID) | ORAL | Status: DC | PRN

## 2011-05-22 MED ORDER — SODIUM CHLORIDE 0.9 % IV SOLN
500.0000 mg | Freq: Once | INTRAVENOUS | Status: AC
  Administered 2011-05-22: 500 mg via INTRAVENOUS
  Filled 2011-05-22: qty 500

## 2011-05-22 MED ORDER — DOCUSATE SODIUM 100 MG PO CAPS
100.0000 mg | ORAL_CAPSULE | Freq: Two times a day (BID) | ORAL | Status: DC
  Administered 2011-05-24 – 2011-05-25 (×2): 100 mg via ORAL
  Filled 2011-05-22 (×13): qty 1

## 2011-05-22 MED ORDER — LINAGLIPTIN 5 MG PO TABS
5.0000 mg | ORAL_TABLET | Freq: Once | ORAL | Status: AC
  Administered 2011-05-22: 5 mg via ORAL
  Filled 2011-05-22: qty 1

## 2011-05-22 MED ORDER — VITAMIN D 50 MCG (2000 UT) PO TABS: 2000.0000 [IU] | ORAL_TABLET | Freq: Every day | ORAL | Status: DC

## 2011-05-22 MED ORDER — MORPHINE SULFATE 2 MG/ML IJ SOLN
1.0000 mg | INTRAMUSCULAR | Status: DC | PRN
  Administered 2011-05-23 – 2011-05-24 (×4): 1 mg via INTRAVENOUS
  Filled 2011-05-22 (×3): qty 1

## 2011-05-22 MED ORDER — PANTOPRAZOLE SODIUM 40 MG PO TBEC
40.0000 mg | DELAYED_RELEASE_TABLET | Freq: Every day | ORAL | Status: DC
  Administered 2011-05-22 – 2011-05-28 (×7): 40 mg via ORAL
  Filled 2011-05-22 (×7): qty 1

## 2011-05-22 MED ORDER — VANCOMYCIN HCL IN DEXTROSE 1-5 GM/200ML-% IV SOLN
1000.0000 mg | Freq: Once | INTRAVENOUS | Status: AC
  Administered 2011-05-22: 1000 mg via INTRAVENOUS
  Filled 2011-05-22: qty 200

## 2011-05-22 MED ORDER — SODIUM CHLORIDE 0.9 % IV SOLN
INTRAVENOUS | Status: DC
  Administered 2011-05-22: 19:00:00 via INTRAVENOUS

## 2011-05-22 MED ORDER — PIPERACILLIN-TAZOBACTAM 3.375 G IVPB: 3.3750 g | Freq: Once | INTRAVENOUS | Status: DC

## 2011-05-22 MED ORDER — VANCOMYCIN HCL 1000 MG IV SOLR: 1000.0000 mg | INTRAVENOUS | Status: DC

## 2011-05-22 MED ORDER — HYDROCODONE-ACETAMINOPHEN 5-325 MG PO TABS
1.0000 | ORAL_TABLET | ORAL | Status: DC | PRN
  Administered 2011-05-23: 1 via ORAL
  Administered 2011-05-23: 2 via ORAL
  Administered 2011-05-25 – 2011-05-28 (×4): 1 via ORAL
  Filled 2011-05-22 (×3): qty 1
  Filled 2011-05-22: qty 2
  Filled 2011-05-22 (×2): qty 1

## 2011-05-22 MED ORDER — LINAGLIPTIN 5 MG PO TABS
5.0000 mg | ORAL_TABLET | Freq: Every day | ORAL | Status: DC
  Administered 2011-05-23 – 2011-05-28 (×6): 5 mg via ORAL
  Filled 2011-05-22 (×6): qty 1

## 2011-05-22 MED ORDER — PROCHLORPERAZINE MALEATE 10 MG PO TABS
10.0000 mg | ORAL_TABLET | Freq: Four times a day (QID) | ORAL | Status: DC | PRN
  Filled 2011-05-22: qty 1

## 2011-05-22 MED ORDER — ACYCLOVIR 200 MG PO CAPS
400.0000 mg | ORAL_CAPSULE | Freq: Four times a day (QID) | ORAL | Status: DC
  Filled 2011-05-22 (×3): qty 2

## 2011-05-22 MED ORDER — ACYCLOVIR 400 MG PO TABS
400.0000 mg | ORAL_TABLET | Freq: Four times a day (QID) | ORAL | Status: DC
  Administered 2011-05-22 – 2011-05-28 (×23): 400 mg via ORAL
  Filled 2011-05-22 (×27): qty 1

## 2011-05-22 MED ORDER — BENAZEPRIL HCL 20 MG PO TABS
40.0000 mg | ORAL_TABLET | Freq: Every day | ORAL | Status: DC
  Administered 2011-05-23 – 2011-05-28 (×6): 40 mg via ORAL
  Filled 2011-05-22 (×6): qty 2

## 2011-05-22 MED ORDER — AMLODIPINE BESYLATE 10 MG PO TABS
10.0000 mg | ORAL_TABLET | Freq: Every day | ORAL | Status: DC
  Administered 2011-05-23 – 2011-05-28 (×6): 10 mg via ORAL
  Filled 2011-05-22 (×6): qty 1

## 2011-05-22 MED ORDER — VANCOMYCIN HCL IN DEXTROSE 1-5 GM/200ML-% IV SOLN
1000.0000 mg | Freq: Two times a day (BID) | INTRAVENOUS | Status: DC
  Administered 2011-05-23 – 2011-05-24 (×4): 1000 mg via INTRAVENOUS
  Filled 2011-05-22 (×5): qty 200

## 2011-05-22 MED ORDER — FLUCONAZOLE 100 MG PO TABS
100.0000 mg | ORAL_TABLET | Freq: Every day | ORAL | Status: DC
  Administered 2011-05-22 – 2011-05-26 (×5): 100 mg via ORAL
  Filled 2011-05-22 (×6): qty 1

## 2011-05-22 MED ORDER — ATORVASTATIN CALCIUM 40 MG PO TABS
40.0000 mg | ORAL_TABLET | Freq: Every day | ORAL | Status: DC
  Administered 2011-05-23 – 2011-05-27 (×5): 40 mg via ORAL
  Filled 2011-05-22 (×6): qty 1

## 2011-05-22 MED ORDER — VITAMIN D3 25 MCG (1000 UNIT) PO TABS
2000.0000 [IU] | ORAL_TABLET | Freq: Every day | ORAL | Status: DC
  Administered 2011-05-23 – 2011-05-28 (×6): 2000 [IU] via ORAL
  Filled 2011-05-22 (×6): qty 2

## 2011-05-22 MED ORDER — INSULIN ASPART 100 UNIT/ML ~~LOC~~ SOLN
0.0000 [IU] | Freq: Three times a day (TID) | SUBCUTANEOUS | Status: DC
  Administered 2011-05-23: 2 [IU] via SUBCUTANEOUS
  Administered 2011-05-23: 3 [IU] via SUBCUTANEOUS
  Administered 2011-05-24: 2 [IU] via SUBCUTANEOUS
  Administered 2011-05-24: 3 [IU] via SUBCUTANEOUS
  Administered 2011-05-25: 2 [IU] via SUBCUTANEOUS
  Administered 2011-05-25: 3 [IU] via SUBCUTANEOUS
  Administered 2011-05-26 (×3): 2 [IU] via SUBCUTANEOUS
  Administered 2011-05-27: 1 [IU] via SUBCUTANEOUS
  Administered 2011-05-27: 2 [IU] via SUBCUTANEOUS
  Administered 2011-05-27 (×2): 3 [IU] via SUBCUTANEOUS
  Administered 2011-05-28: 5 [IU] via SUBCUTANEOUS
  Administered 2011-05-28: 3 [IU] via SUBCUTANEOUS

## 2011-05-22 NOTE — ED Notes (Signed)
Pt reports she is feeling weak. Has had low grade fever, chills. Reports drainage and foul odor at left breast/chest mastectomy site.

## 2011-05-22 NOTE — ED Notes (Signed)
[id number]<AV> Expected date:<BR> Expected time:<BR> Means of arrival:<BR> Comments:<BR> Mastectomy 6 wks ago and wound is opening and has drainage, neuto low, devine from [facility] is infrom to admit her once we get labs back.

## 2011-05-22 NOTE — Progress Notes (Signed)
OFFICE PROGRESS NOTE  CC  Michele Mcalpine, MD, MD [facility] [address] [place] [address] Dr. Calton D[address] Dr. Chipper Herb Dr. Romero Belling  DIAGNOSIS: 71 year old female with stage III a invasive lobular carcinoma of the left breast status post mastectomy with left axillary lymph node dissection performed on 03/23/2011.  PRIOR THERAPY:  #1 patient was originally seen by me on 03/05/2011 for a invasive lobular carcinoma of the left breast the tumor was 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 Her clinical staging was stage IIB (T3, N0, M0).  #2 the patient has gone on to have a left breast mastectomy with axillary lymph node dissection on 03/23/2011. The final pathology revealed a 6.0 cm invasive lobular carcinoma with lymphovascular invasion and perineural invasion. Invasive tumor was 2.9 cm from the nearest margin lobular carcinoma in situ was noted. Initially 6 lymph nodes were positive for metastatic carcinoma. Tumor deposits were 0.5 0.8 0.7 0.7 0.3 and 1.0 cm focal extracapsular tumor extension was noted. Tumor was grade 3. The final lymph node count was 16 9 of which were positive for metastatic disease. Tumor was estrogen receptor +100% progesterone receptor +100% HER-2/neu negative with a ratio 1.31 proliferation marker 18% final pathologic staging was p T3a pN2a, Stage IIIa   #3.adjuvant Chemotherapy with FEC 04/17/11 for a total of 4 cycles  CURRENT THERAPY: adjuvant chemotherapy s/p cycle 2 FEC 100   INTERVAL HISTORY: [name] 71 y.o. female returns for Followup visit. Patient is here for interim blood counts and office visit. Clinically she seems to have deteriorated. She is very weak tired fatigued. She is not able to do much around the house. She has not been drinking much. She also noticed a few days ago.her left breast wound mastectomy side had opened and there is a discharge which is quite  malodorous. Yesterday she did experience chills but no fevers. She today has difficulty with ambulation and she is dizzy. She has not been drinking much. She has not had any bleeding problems however.  MEDICAL HISTORY: Past Medical History  Diagnosis Date  . DIABETES MELLITUS 08/10/2007  . 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 HYPERCHOLESTEROLEMIA 12/17/2006  . OBESITY 06/04/2008  . ANXIETY 06/04/2008  . DEPRESSION 08/10/2007  . HYPERTENSION 12/17/2006  . ASTHMATIC BRONCHITIS, ACUTE 06/04/2008  . ALLERGIC RHINITIS 12/13/2007  . GERD 12/17/2006  . DEGENERATIVE JOINT DISEASE 12/17/2006  . Asthma   . Blood transfusion 1964    post childbirth  . 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  MEDICATIONS:  Current Outpatient Prescriptions  Medication Sig Dispense Refill  . acyclovir (ZOVIRAX) 200 MG capsule Take 400 mg by mouth every 6 (six) hours. While awake.      Marland Kitchen amLODipine (NORVASC) 10 MG tablet Take 1 tablet (10 mg total) by mouth daily.  30 tablet  11  . atorvastatin (LIPITOR) 40 MG tablet Take 1 tablet (40 mg total) by mouth daily.  30 tablet  11  . benazepril (LOTENSIN) 40 MG tablet Take 1 tablet (40 mg total) by mouth daily.  30 tablet  11  . Cholecalciferol (VITAMIN D) 2000 UNITS tablet Take 2,000 Units by mouth daily.        . ciprofloxacin (CIPRO) 500 MG tablet Take 1 tablet (500 mg total) by mouth 2 (two) times daily.  10 tablet  0  .  dexamethasone (DECADRON) 4 MG tablet Take 2 tablets by mouth once a day on the day after chemotherapy and then take 2 tablets two times a day for 2 days. Take with food.  30 tablet  1  . fluconazole (DIFLUCAN) 100 MG tablet Take 100 mg by mouth daily. #30 given 05/06/11 w/3RF-CTS      . HYDROcodone-acetaminophen (NORCO) 5-325 MG per tablet Take 1 tablet by mouth every 6 (six) hours as needed. Pain.      .  Insulin Glargine (LANTUS SOLOSTAR [place]) Inject 15 Units into the skin daily.      Marland Kitchen lidocaine-prilocaine (EMLA) cream Apply 1 application topically as needed. For pain      . LORazepam (ATIVAN) 0.5 MG tablet Take 1 tablet (0.5 mg total) by mouth every 6 (six) hours as needed (Nausea or vomiting).  30 tablet  0  . metFORMIN (GLUCOPHAGE-XR) 500 MG 24 hr tablet TAKE ONE TABLET BY MOUTH TWICE DAILY  60 tablet  5  . omeprazole (PRILOSEC) 20 MG capsule Take 1 capsule (20 mg total) by mouth daily.  30 capsule  11  . ondansetron (ZOFRAN) 8 MG tablet Take 1 tablet two times a day as needed for nausea or vomiting starting on the third day after chemotherapy.  30 tablet  1  . prochlorperazine (COMPAZINE) 10 MG tablet Take 1 tablet (10 mg total) by mouth every 6 (six) hours as needed (Nausea or vomiting).  30 tablet  1  . sertraline (ZOLOFT) 50 MG tablet Take 1 tablet (50 mg total) by mouth daily.  30 tablet  5  . sitaGLIPtin (JANUVIA) 50 MG tablet Take 50 mg by mouth daily.        SURGICAL HISTORY:  Past Surgical History  Procedure Date  . Laminectomy [id number]    s/p-Dr. Jule Ser  . Bladder tack 1974  . Cholecystectomy 02/23/1991    [facility] - Dr Jamey Ripa  . Tonsillectomy   . Rotator cuff repair 8/03    right, Dr. Shelle Iron  . Enterocele repair 06/08    Dr. Estanislado Pandy  . Rotator cuff repair 10/26/2008    left, Dr. Rayburn Ma  . Hip surgery 08/27/2010    DR. Despina Hick  . Breast excisional biopsy 11/10/1988    left benign Dr Jamey Ripa  . Breast excisional biopsy 03/04/1983    Right - two areas - Dr Jamey Ripa  . Breast excisional biopsy 10/09/1980    right - Dr Jamey Ripa  . Breast excisional biopsy 10/18/1979    left - Dr Jamey Ripa  . Breast excisional biopsy 01/02/1994    right - Dr Jamey Ripa  . Vaginal hysterectomy 1974    partial   . Colonoscopy   . Mastectomy     left  . Modified radical mastectomy w/ axillary lymph node dissection     03-30-11 LT  . Portacath placement 04/13/2011    Procedure: INSERTION PORT-A-CATH;   Surgeon: Currie Paris, MD;  Location: [facility];  Service: General;  Laterality: N/A;  porta cath placement,removal of J-P drain    REVIEW OF SYSTEMS:  Pertinent items are noted in HPI.   PHYSICAL EXAMINATION: General: Patient is fatigued looks dehydrated but in no acute distress.  HEENT: EOMI PERRLA oral mucosa is dry neck is supple no palpable adenopathy. Lungs: Clear to auscultation cardiovascular: Regular rate rhythm abdomen: Soft nontender nondistended. Extremities: No edema clubbing or cyanosis neuro: Nonfocal left mastectomy scar: Medially the scar is open there is greenish white discharge noted no blood it is malodorous.Marland Kitchen  ECOG  PERFORMANCE STATUS: 1 - Symptomatic but completely ambulatory  Blood pressure 145/71, pulse 94, temperature 97.8 F (36.6 C), height 5\' 8"  (1.727 m), weight 185 lb 1.6 oz (83.961 kg).  LABORATORY DATA: Lab Results  Component Value Date   WBC 0.4* 05/22/2011   HGB 11.4* 05/22/2011   HCT 34.7* 05/22/2011   MCV 86.9 05/22/2011   PLT 75* 05/22/2011      Chemistry      Component Value Date/Time   NA 137 05/16/2011 1835   K 3.8 05/16/2011 1835   CL 100 05/16/2011 1835   CO2 24 05/16/2011 1835   BUN 17 05/16/2011 1835   CREATININE 0.53 05/16/2011 1835      Component Value Date/Time   CALCIUM 9.4 05/16/2011 1835   ALKPHOS 123* 05/16/2011 1835   AST 21 05/16/2011 1835   ALT 25 05/16/2011 1835   BILITOT 0.2* 05/16/2011 1835      ASSESSMENT: 71 year old female with  #1 invasive lobular carcinoma of the left breast status post mastectomy with axillary lymph node dissection. The final pathology revealed a T3 N2 way invasive lobular carcinoma. The tumor measured 6.0 cm 9 of 16 lymph nodes were positive for metastatic disease. Tumor was ER positive PR positive HER-2/neu negative proliferation marker 18%.  #2 neutropenia/pancytopenia  #3 mastectomy scar wound infection.  PLAN:   #1 patient is being referred to the emergency room for for full  evaluation of her wound. I have spoken to [facility] hospitalists if need arises they will admit the patient. Patient will need antibiotics and I do think they should be IV antibiotics as patient isn't neutropenic and is at risk for fevers and infections and sepsis.  #2 all of this is explained to the patient and her friend who accompanies her today.  All questions were answered. The patient knows to call the clinic with any problems, questions or concerns. We can certainly see the patient much sooner if necessary.  I spent 30 minutes counseling the patient face to face. The total time spent in the appointment was 30 minutes.    Drue Second, MD Medical/Oncology [facility] [phone] (beeper) [phone] (Office)  05/22/2011, 2:58 PM

## 2011-05-22 NOTE — ED Notes (Signed)
Called to give report nurse unavailable will call back.  

## 2011-05-22 NOTE — ED Notes (Signed)
Pt given a ham sandwich, two cheese sticks (28 g each with zero carbs), diet ginger ale.

## 2011-05-22 NOTE — Patient Instructions (Signed)
Admit to ER

## 2011-05-22 NOTE — ED Notes (Signed)
Infection prevention returned call with advise given that pt does not need isolation for shingles at this time.

## 2011-05-22 NOTE — H&P (Addendum)
PCP:  Michele Mcalpine, MD, MD   DOA:  05/22/2011  4:08 PM  Chief Complaint:  Breast wound, left  HPI: 71 year old female with multiple medical co morbidities who presented to ED from oncologist office today after evaluation of her left breast wound status post mastectomy. Per patient the wound has been draining pus. It was getting worse over last couple of days prior to admission. Patient reports subjective fever at home but no chills, no chest pain, no   Assessment/Plan  Principal Problem:   *Open wound of left breast - surgery consult appreciated - for now we will start Primaxin per pharmacy - vancomycin per pharmacy - continue fluconazole - discontinue ciprofloxacin which was patient's home medication - follow up blood cultures and wound culture - obtain procalcitonin and lactic acid level - follow up admission labs including CBC and CMP  Active Problems:   DIABETES MELLITUS - sliding scale insulin - continue CBG monitoring - continue sitagliptin and insulin 15 units daily - hold metformin   HYPERCHOLESTEROLEMIA - continue atorvastatin   ANXIETY AND DEPRESSION - continue ativan and sertraline   HYPERTENSION - continue norvasc and benazepril   GERD - continue protonix daily   Breast cancer, ILC, left, receptor +, her 2 neg - managed by Dr. Welton Flakes of oncology - dexamethasone with chemotherapy   Pancytopenia - secondary to history of breast cancer/ chemotherapy - hemoglobin in 11.4 and platelets around 76 - no transfusion warranted at this time  DVT Prophylaxis - SCD bilaterally  Code Status - full code  Education  - test results and diagnostic studies were discussed with patient  - patient verbalized  the understanding - questions were answered at the bedside and contact information was provided for additional questions or concerns  Disposition - admit to med-surg floor - PT evaluation     Allergies: Allergies  Allergen Reactions  . Ceftriaxone  Sodium     REACTION: hives  . Cephalexin     REACTION: hives  . Clindamycin     REACTION: hives  . Dilaudid (Hydromorphone Hcl) Itching  . Oxycodone-Acetaminophen     REACTION: hives  . Rocephin (Ceftriaxone Sodium In Dextrose) Hives  . Sulfonamide Derivatives     REACTION: hives    Prior to Admission medications   Medication Sig Start Date End Date Taking? Authorizing Provider  acyclovir (ZOVIRAX) 200 MG capsule Take 400 mg by mouth every 6 (six) hours. While awake.   Yes Historical Provider, MD  amLODipine (NORVASC) 10 MG tablet Take 1 tablet (10 mg total) by mouth daily. 01/14/11  Yes Michele Mcalpine, MD  atorvastatin (LIPITOR) 40 MG tablet Take 1 tablet (40 mg total) by mouth daily. 02/12/11 02/12/12 Yes Michele Mcalpine, MD  benazepril (LOTENSIN) 40 MG tablet Take 1 tablet (40 mg total) by mouth daily. 01/14/11  Yes Michele Mcalpine, MD  Cholecalciferol (VITAMIN D) 2000 UNITS tablet Take 2,000 Units by mouth daily.     Yes Historical Provider, MD  ciprofloxacin (CIPRO) 500 MG tablet Take 1 tablet (500 mg total) by mouth 2 (two) times daily. 05/16/11 05/26/11 Yes Suzi Roots, MD  dexamethasone (DECADRON) 4 MG tablet Take 2 tablets by mouth once a day on the day after chemotherapy and then take 2 tablets two times a day for 2 days. Take with food. 03/30/11 03/29/12 Yes Victorino December, MD  HYDROcodone-acetaminophen (NORCO) 5-325 MG per tablet Take 1 tablet by mouth every 6 (six) hours as needed. Pain. 04/06/11  Yes Christian  [name], MD  Insulin Glargine (LANTUS SOLOSTAR [place]) Inject 15 Units into the skin daily.   Yes Historical Provider, MD  lidocaine-prilocaine (EMLA) cream Apply 1 application topically as needed. For pain 03/30/11 03/29/12 Yes Victorino December, MD  LORazepam (ATIVAN) 0.5 MG tablet Take 1 tablet (0.5 mg total) by mouth every 6 (six) hours as needed (Nausea or vomiting). 03/30/11 09/26/11 Yes Victorino December, MD  metFORMIN (GLUCOPHAGE-XR) 500 MG 24 hr tablet TAKE ONE TABLET BY MOUTH TWICE  DAILY 05/14/11  Yes Michele Mcalpine, MD  omeprazole (PRILOSEC) 20 MG capsule Take 1 capsule (20 mg total) by mouth daily. 01/14/11  Yes Michele Mcalpine, MD  ondansetron (ZOFRAN) 8 MG tablet Take 1 tablet two times a day as needed for nausea or vomiting starting on the third day after chemotherapy. 03/30/11 03/29/12 Yes Victorino December, MD  prochlorperazine (COMPAZINE) 10 MG tablet Take 1 tablet (10 mg total) by mouth every 6 (six) hours as needed (Nausea or vomiting). 03/30/11 03/29/12 Yes Victorino December, MD  sertraline (ZOLOFT) 50 MG tablet Take 1 tablet (50 mg total) by mouth daily. 04/07/11  Yes Michele Mcalpine, MD  sitaGLIPtin (JANUVIA) 50 MG tablet Take 50 mg by mouth daily.   Yes Historical Provider, MD    Past Medical History  Diagnosis Date  . DIABETES MELLITUS 08/10/2007  . 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 HYPERCHOLESTEROLEMIA 12/17/2006  . OBESITY 06/04/2008  . ANXIETY 06/04/2008  . DEPRESSION 08/10/2007  . HYPERTENSION 12/17/2006  . ASTHMATIC BRONCHITIS, ACUTE 06/04/2008  . ALLERGIC RHINITIS 12/13/2007  . GERD 12/17/2006  . DEGENERATIVE JOINT DISEASE 12/17/2006  . Asthma   . Blood transfusion 1964    post childbirth  . 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 Laminectomy [id number]    s/p-Dr. Jule Ser  . Bladder tack 1974  . Cholecystectomy 02/23/1991    [facility] - Dr Jamey Ripa  . Tonsillectomy   . Rotator cuff repair 8/03    right, Dr. Shelle Iron  . Enterocele repair 06/08    Dr. Estanislado Pandy  . Rotator cuff repair 10/26/2008    left, Dr. Rayburn Ma  . Hip surgery 08/27/2010    DR. Despina Hick  . Breast excisional biopsy 11/10/1988    left benign Dr Jamey Ripa  . Breast excisional biopsy 03/04/1983    Right - two areas - Dr Jamey Ripa  . Breast excisional biopsy 10/09/1980    right - Dr Jamey Ripa  . Breast excisional biopsy 10/18/1979    left - Dr Jamey Ripa  . Breast excisional biopsy 01/02/1994    right - Dr Jamey Ripa  . Vaginal hysterectomy 1974    partial   .  Colonoscopy   . Mastectomy     left  . Modified radical mastectomy w/ axillary lymph node dissection     03-30-11 LT  . Portacath placement 04/13/2011    Procedure: INSERTION PORT-A-CATH;  Surgeon: Currie Paris, MD;  Location: [facility];  Service: General;  Laterality: N/A;  porta cath placement,removal of J-P drain    Social History:  reports that she has never smoked. She has never used smokeless tobacco. She reports that she does not drink alcohol or use illicit drugs.  Family History  Problem Relation Age of Onset  . COPD Mother   . Arthritis Mother   . Emphysema Mother   . Mental illness Paternal Grandfather   . Heart disease Other  Sibling  . Diabetes Other     Sibling   . Diabetes Brother   . Cancer Brother     prostate  . Cancer Father     malignant brain tumor    Review of Systems:  Constitutional: Denies fever, chills, diaphoresis, appetite change and fatigue.  HEENT: Denies photophobia, eye pain, redness, hearing loss, ear pain, congestion, sore throat, rhinorrhea, sneezing, mouth sores, trouble swallowing, neck pain, neck stiffness and tinnitus.   Respiratory: Denies SOB, DOE, cough, chest tightness,  and wheezing.   Cardiovascular: Denies chest pain, palpitations and leg swelling.  Gastrointestinal: Denies nausea, vomiting, abdominal pain, diarrhea, constipation, blood in stool and abdominal distention.  Genitourinary: Denies dysuria, urgency, frequency, hematuria, flank pain and difficulty urinating.  Musculoskeletal: Denies myalgias, back pain, joint swelling, arthralgias and gait problem.  Skin: per hpi  Neurological: Denies dizziness, seizures, syncope, weakness, light-headedness, numbness and headaches.  Hematological: Denies adenopathy. Easy bruising, personal or family bleeding history  Psychiatric/Behavioral: Denies suicidal ideation, mood changes, confusion, nervousness, sleep disturbance and agitation   Physical Exam:  Filed  Vitals:   05/22/11 1614  BP: 126/74  Pulse: 89  Temp: 99 F (37.2 C)  TempSrc: Oral  Resp: 22  SpO2: 100%    Constitutional: Vital signs reviewed.  Patient is in no acute distress and cooperative with exam. Alert and oriented x3.  Head: Normocephalic and atraumatic Ear: TM normal bilaterally Mouth: no erythema or exudates, MMM Eyes: PERRL, EOMI, conjunctivae normal, No scleral icterus.  Neck: Supple, Trachea midline normal ROM, No JVD, mass, thyromegaly, or carotid bruit present.  Cardiovascular: RRR, S1 normal, S2 normal, no MRG, pulses symmetric and intact bilaterally Pulmonary/Chest: CTAB, no wheezes, rales, or rhonchi Abdominal: Soft. Non-tender, non-distended, bowel sounds are normal, no masses, organomegaly, or guarding present.  GU: no CVA tenderness Musculoskeletal: No joint deformities, erythema, or stiffness, ROM full and no nontender Ext: no edema and no cyanosis, pulses palpable bilaterally (DP and PT) Hematology: no cervical, inginal, or axillary adenopathy.  Neurological: A&O x3, Strenght is normal and symmetric bilaterally, cranial nerve II-XII are grossly intact, no focal motor deficit, sensory intact to light touch bilaterally.  Skin: open left breast wound about 2-3 inches in diameter looks like post surgical; draining pus  Psychiatric: Normal mood and affect. speech and behavior is normal. Judgment and thought content normal. Cognition and memory are normal.   Labs on Admission:  Results for orders placed in visit on 05/22/11 (from the past 48 hour(s))  CBC WITH DIFFERENTIAL     Status: Abnormal   Collection Time   05/22/11  2:05 PM      Component Value Range Comment   WBC 0.4 (*) 3.9 - 10.3 (10e3/uL)    NEUT# 0.0 (*) 1.5 - 6.5 (10e3/uL)    HGB 11.4 (*) 11.6 - 15.9 (g/dL)    HCT 16.1 (*) 09.6 - 46.6 (%)    Platelets 75 (*) 145 - 400 (10e3/uL)    MCV 86.9  79.5 - 101.0 (fL)    MCH 28.5  25.1 - 34.0 (pg)    MCHC 32.8  31.5 - 36.0 (g/dL)    RBC 0.45  4.09 -  8.11 (10e6/uL)    RDW 14.2  11.2 - 14.5 (%)    lymph# 0.4 (*) 0.9 - 3.3 (10e3/uL)    MONO# 0.0 (*) 0.1 - 0.9 (10e3/uL)    Eosinophils Absolute 0.0  0.0 - 0.5 (10e3/uL)    Basophils Absolute 0.0  0.0 - 0.1 (10e3/uL)    NEUT%  6.4 (*) 38.4 - 76.8 (%)    LYMPH% 90.9 (*) 14.0 - 49.7 (%)    MONO% 2.4  0.0 - 14.0 (%)    EOS% 0.3  0.0 - 7.0 (%)    BASO% 0.0  0.0 - 2.0 (%)    nRBC 1 (*) 0 - 0 (%)     Radiological Exams on Admission: No results found.  Time Spent on Admission: Over 30 minutes  DEVINE, ALMA 05/22/2011, 5:29 PM  [facility] Pager # [phone] Main Office # [phone]

## 2011-05-22 NOTE — Progress Notes (Signed)
ANTIBIOTIC CONSULT NOTE - INITIAL  Pharmacy Consult for Primaxin and Vanco Indication: Left Breast Wound s/p Mastectomy  Allergies  Allergen Reactions  . Ceftriaxone Sodium     REACTION: hives  . Cephalexin     REACTION: hives  . Clindamycin     REACTION: hives  . Dilaudid (Hydromorphone Hcl) Itching  . Oxycodone-Acetaminophen     REACTION: hives  . Rocephin (Ceftriaxone Sodium In Dextrose) Hives  . Sulfonamide Derivatives     REACTION: hives    Patient Measurements: Height: 5\' 8"  (172.7 cm) Weight: 181 lb (82.101 kg) IBW/kg (Calculated) : 63.9   Vital Signs: Temp: 99.8 F (37.7 C) (04/19 1830) Temp src: Oral (04/19 1614) BP: 133/79 mmHg (04/19 1830) Pulse Rate: 86  (04/19 1830) Intake/Output from previous day:   Intake/Output from this shift:    Labs:  Basename 05/22/11 1405  WBC 0.4*  HGB 11.4*  PLT 75*  LABCREA --  CREATININE --   Estimated Creatinine Clearance: 73.5 ml/min (by C-G formula based on Cr of 0.53). No results found for this basename: VANCOTROUGH:2,VANCOPEAK:2,VANCORANDOM:2,GENTTROUGH:2,GENTPEAK:2,GENTRANDOM:2,TOBRATROUGH:2,TOBRAPEAK:2,TOBRARND:2,AMIKACINPEAK:2,AMIKACINTROU:2,AMIKACIN:2, in the last 72 hours   Microbiology: Recent Results (from the past 720 hour(s))  URINE CULTURE     Status: Normal   Collection Time   05/16/11  5:53 PM      Component Value Range Status Comment   Specimen Description URINE, CLEAN CATCH   Final    Special Requests NONE   Final    Culture  Setup Time [id number]   Final    Colony Count 45,000 COLONIES/ML   Final    Culture     Final    Value: GROUP B STREP(S.AGALACTIAE)ISOLATED     Note: TESTING AGAINST S. AGALACTIAE NOT ROUTINELY PERFORMED DUE TO PREDICTABILITY OF AMP/PEN/VAN SUSCEPTIBILITY.   Report Status 05/17/2011 FINAL   Final     Medical History: Past Medical History  Diagnosis Date  . DIABETES MELLITUS 08/10/2007  . 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 HYPERCHOLESTEROLEMIA 12/17/2006  . OBESITY 06/04/2008  . ANXIETY  06/04/2008  . DEPRESSION 08/10/2007  . HYPERTENSION 12/17/2006  . ASTHMATIC BRONCHITIS, ACUTE 06/04/2008  . ALLERGIC RHINITIS 12/13/2007  . GERD 12/17/2006  . DEGENERATIVE JOINT DISEASE 12/17/2006  . Asthma   . Blood transfusion 1964    post childbirth  . Breast cancer, IXC, Right, receptor +, her 2 neg 02/20/2011    left    Medications:  Anti-infectives     Start     Dose/Rate Route Frequency Ordered Stop   05/22/11 1900   vancomycin (VANCOCIN) IVPB 1000 mg/200 mL premix        1,000 mg 200 mL/hr over 60 Minutes Intravenous  Once 05/22/11 1737     05/22/11 1900   imipenem-cilastatin (PRIMAXIN) 500 mg in sodium chloride 0.9 % 100 mL IVPB        500 mg 200 mL/hr over 30 Minutes Intravenous  Once 05/22/11 1806     05/22/11 1745   piperacillin-tazobactam (ZOSYN) IVPB 3.375 g  Status:  Discontinued        3.375 g 12.5 mL/hr over 240 Minutes Intravenous  Once 05/22/11 1737 05/22/11 1755         Assessment: 71 yo F admitted 05/22/11 with left breast wound s/p mastectomy for breast cancer (reportedly draining pus). Starting empiric Primaxin and Vanco (neutropenic). Will aim for higher trough given neutropenic state. Primaxin 500mg  x1 and Vanco 1g x1 given at 19:00.  Goal of Therapy:  Vancomycin trough level 15-20 mcg/ml  Plan:  1) Primaxin 500mg   IV q6h (next due at midnight) 2) Vanco 1g IV q12h (next due 4/20 at 06:00) 3) Check trough at steady state  Darrol Angel, PharmD Pager: [phone] 05/22/2011,7:15 PM

## 2011-05-23 DIAGNOSIS — E876 Hypokalemia: Secondary | ICD-10-CM | POA: Diagnosis not present

## 2011-05-23 LAB — CBC
HCT: 29.6 % — ABNORMAL LOW (ref 36.0–46.0)
Hemoglobin: 9.9 g/dL — ABNORMAL LOW (ref 12.0–15.0)
MCV: 85.5 fL (ref 78.0–100.0)
Platelets: 68 10*3/uL — ABNORMAL LOW (ref 150–400)
Platelets: 63 10*3/uL — ABNORMAL LOW (ref 150–400)
RBC: 3.46 MIL/uL — ABNORMAL LOW (ref 3.87–5.11)
RDW: 13.8 % (ref 11.5–15.5)
WBC: 0.5 10*3/uL — CL (ref 4.0–10.5)
WBC: 0.6 10*3/uL — CL (ref 4.0–10.5)

## 2011-05-23 LAB — COMPREHENSIVE METABOLIC PANEL
Albumin: 2.6 g/dL — ABNORMAL LOW (ref 3.5–5.2)
BUN: 5 mg/dL — ABNORMAL LOW (ref 6–23)
Calcium: 8.5 mg/dL (ref 8.4–10.5)
Creatinine, Ser: 0.47 mg/dL — ABNORMAL LOW (ref 0.50–1.10)
Total Bilirubin: 0.4 mg/dL (ref 0.3–1.2)
Total Protein: 5.3 g/dL — ABNORMAL LOW (ref 6.0–8.3)

## 2011-05-23 LAB — GLUCOSE, CAPILLARY: Glucose-Capillary: 155 mg/dL — ABNORMAL HIGH (ref 70–99)

## 2011-05-23 MED ORDER — POTASSIUM CHLORIDE 10 MEQ/100ML IV SOLN
10.0000 meq | INTRAVENOUS | Status: AC
  Administered 2011-05-23 (×4): 10 meq via INTRAVENOUS
  Filled 2011-05-23 (×4): qty 100

## 2011-05-23 MED ORDER — MAGNESIUM SULFATE IN D5W 10-5 MG/ML-% IV SOLN
1.0000 g | Freq: Once | INTRAVENOUS | Status: AC
  Administered 2011-05-23: 1 g via INTRAVENOUS
  Filled 2011-05-23: qty 100

## 2011-05-23 MED ORDER — BIOTENE DRY MOUTH MT LIQD
15.0000 mL | Freq: Two times a day (BID) | OROMUCOSAL | Status: DC
  Administered 2011-05-23 – 2011-05-28 (×11): 15 mL via OROMUCOSAL

## 2011-05-23 NOTE — Progress Notes (Signed)
Paged NP about K 2.9 and Mg+ 1.5, orders received

## 2011-05-23 NOTE — Consult Note (Signed)
   General Surgery [facility] - Progress Note  Subjective: Patient of Dr. Cyndia Bent.  Underwent left mastectomy 03-23-2011.  Wound has been debrided in office by Dr. Jamey Ripa.  HHN from [facility] has been requested.  Asked to evaluate wound while patient in-patient at [facility].  Objective: Vital signs in last 24 hours: Temp:  [97.8 F (36.6 C)-99.8 F (37.7 C)] 99.5 F (37.5 C) (04/20 0503) Pulse Rate:  [86-94] 87  (04/20 0503) Resp:  [16-22] 16  (04/20 0503) BP: (122-145)/(64-79) 131/64 mmHg (04/20 0503) SpO2:  [95 %-100 %] 95 % (04/20 0503) Weight:  [180 lb (81.647 kg)-185 lb 1.6 oz (83.961 kg)] 184 lb 11.2 oz (83.779 kg) (04/20 0503) Last BM Date: 05/22/11  Intake/Output from previous day: 04/19 0701 - 04/20 0700 In: 2450.8 [P.O.:370; I.V.:1180.8; IV Piggyback:900] Out: -   Exam: HEENT - clear, not icteric Neck - soft Chest - mastectomy wound left chest wall; lateral wound closed; no significant seroma; medial portion of wound open 6 x 1 cm with exposed subcutaneous tissue, minimal granulation, slight odor, no significant drainage; infusion port accessed right chest wall  Lab Results:   Basename 05/23/11 0826 05/22/11 1955  WBC 0.6* 0.5*  HGB 9.9* 10.5*  HCT 29.6* 31.4*  PLT 63* 68*     Basename 05/23/11 0826 05/22/11 1955  NA 136 134*  K 3.4* 2.9*  CL 102 100  CO2 27 28  GLUCOSE 144* 208*  BUN 5* 7  CREATININE 0.47* 0.50  CALCIUM 8.5 8.4    Studies/Results: Dg Chest 2 View  05/22/2011  *RADIOLOGY REPORT*  Clinical Data: Infection at left mastectomy site, fever, shortness of breath  CHEST - 2 VIEW  Comparison: 04/13/2011  Findings: Lungs are clear. No pleural effusion or pneumothorax.  Cardiomediastinal silhouette is within normal limits.  Right chest power port.  Status post left mastectomy and left axillary lymph node dissection.  Degenerative changes of the visualized thoracolumbar spine.  Cholecystectomy clips.   IMPRESSION: No evidence of acute cardiopulmonary disease.  Original Report Authenticated By: Charline Bills, M.D.    Assessment / Plan: 1.  Open left mastectomy wound with poor wound healing  - wet to dry dressing changes tid  - will ask PT hydrotherapy to evaluate for possible wound therapy  - Dr. Jamey Ripa to evaluate on Monday  Velora Heckler, MD, [facility] Office: [phone]  05/23/2011

## 2011-05-23 NOTE — Progress Notes (Signed)
Physical Therapy Wound Evaluation Patient Details  Name: CARMENCITA CUSIC MRN: [id number] Date of Birth: 1940-08-17  Today's Date: 05/23/2011 Time: 1320-1404 Time Calculation (min): 44 min  Subjective  Subjective: "It smells really bad" Patient and Family Stated Goals: "I want this to get better"  Pain Score: Pain Score:   4  Wound Assessment                                   Wound 05/22/11 Dehisced Breast Left;Anterior incision from mastectomy has opened  (Active)  Site / Wound Assessment Painful 05/23/2011  2:11 PM  % Wound base Red or Granulating 0% 05/23/2011  2:11 PM  % Wound base Yellow 80% 05/23/2011  2:11 PM  % Wound base Black 20% 05/23/2011  2:11 PM  Peri-wound Assessment Erythema (blanchable) 05/23/2011  2:11 PM  Wound Length (cm) 1 cm 05/23/2011  2:11 PM  Wound Width (cm) 7 cm 05/23/2011  2:11 PM  Wound Depth (cm) 0.4 cm 05/22/2011 10:40 PM  Tunneling (cm) .2 05/22/2011 10:40 PM  Undermining (cm) 1.5 05/23/2011  2:11 PM  Margins Unattacted edges (unapproximated) 05/23/2011  2:11 PM  Closure None 05/23/2011  2:11 PM  Drainage Amount Minimal 05/23/2011  2:11 PM  Drainage Description Purulent 05/23/2011  2:11 PM  Treatment Hydrotherapy (Pulse lavage);Cleansed 05/23/2011  2:11 PM  Dressing Type Moist to dry;Gauze (Comment);Moisture barrier 05/23/2011  2:11 PM  Dressing Changed New 05/23/2011  2:11 PM  Dressing Status Old drainage;Other (Comment) 05/23/2011  2:11 PM     Incision 03/23/11 Chest Left (Active)     Incision 04/13/11 Chest Right (Active)     Incision 04/13/11 Chest Left (Active)                                   Hydrotherapy Pulsed lavage therapy - wound location: L breast at mastectomy incision site Pulsed Lavage with Suction (psi): 4 psi Pulsed Lavage with Suction - Normal Saline Used: 500 mL Pulsed Lavage Tip: Tip with splash shield   Wound Assessment and Plan  Wound Therapy - Assess/Plan/Recommendations Wound Therapy - Clinical Statement: Pt  with open L breast wound at mastectomy insicion site with slough covering most surface area, some black necrotic tissue, some of which was able to be removed.  Performed pulse lavage to wound with pt having increased c/o pain and therefore only able to use half a bag of saline with minimal pressure.  Continue pulse lavage treatment to rid wound of necrotic/slough tissue.   Wound Therapy - Functional Problem List: Reports that she is weak, requiring assistance for OOB.  Factors Delaying/Impairing Wound Healing: Other (comment) (Pain) Hydrotherapy Plan: Debridement;Pulsatile lavage with suction Wound Therapy - Frequency: 6X / week Wound Therapy - Current Recommendations: PT Wound Therapy - Follow Up Recommendations: Home health RN;Other (comment) (pending pt progress)  Wound Therapy Goals- Improve the function of patient's integumentary system by progressing the wound(s) through the phases of wound healing (inflammation - proliferation - remodeling) by: Decrease Necrotic Tissue to: 50%.  Decrease Necrotic Tissue - Progress: Goal set today Increase Granulation Tissue to: 50% Increase Granulation Tissue - Progress: Goal set today Patient/Family will be able to : perform dressing change independently Patient/Family Instruction Goal - Progress: Goal set today Goals/treatment plan/discharge plan were made with and agreed upon by patient/family: Yes Time For Goal Achievement: 2 weeks Wound Therapy -  Potential for Goals: Good  Goals will be updated until maximal potential achieved or discharge criteria met.  Discharge criteria: when goals achieved, discharge from hospital, MD decision/surgical intervention, no progress towards goals, refusal/missing three consecutive treatments without notification or medical reason.  Page, Meribeth Mattes 05/23/2011, 2:33 PM

## 2011-05-23 NOTE — Progress Notes (Signed)
Patient ID: [name], female   DOB: 10/01/1940, 71 y.o.   MRN: [id number]  Assessment/Plan   Principal Problem:   *Open wound of left breast  - status post mastectomy - surgery consult appreciated  - for now continue Primaxin and vanco - continue fluconazole  - follow up blood cultures  - procalcitonin and lactic acid level are within normal limits  Active Problems:   DIABETES MELLITUS  - sliding scale insulin  - continue CBG monitoring  - continue sitagliptin and insulin 15 units daily  - hold metformin   HYPERCHOLESTEROLEMIA  - continue atorvastatin   ANXIETY AND DEPRESSION  - continue ativan and sertraline   HYPERTENSION  - continue norvasc and benazepril   GERD  - continue protonix daily   Hypokalemia - replete - follow up BMP in am  Breast cancer, ILC, left, receptor +, her 2 neg  - managed by Dr. Welton Flakes of oncology  - dexamethasone with chemotherapy   Pancytopenia  - secondary to history of breast cancer/ chemotherapy  - hemoglobin in 11.4 and platelets around 76  - no transfusion warranted at this time   DVT Prophylaxis - SCD bilaterally   Code Status - full code   Education  - test results and diagnostic studies were discussed with patient  - patient verbalized the understanding  - questions were answered at the bedside and contact information was provided for additional questions or concerns   Subjective: No events overnight.   Objective:  Vital signs in last 24 hours:   05/22/11 1830 05/22/11 2203 05/23/11 0503  BP: 133/79 122/72 131/64  Pulse: 86 89 87  Temp: 99.8 F (37.7 C) 99.5 F (37.5 C) 99.5 F (37.5 C)  TempSrc:  Oral Oral  Resp: 18 18 16   Height:  5\' 8"  (1.727 m)   Weight:  81.647 kg (180 lb)   SpO2: 100% 95% 95%    Intake/Output from previous day:   Gross per 24 hour  Intake 2450.8 ml  Output      0 ml  Net 2450.8 ml    Physical Exam: General: Alert, awake, oriented x3, in no acute distress. HEENT: No  bruits, no goiter. Moist mucous membranes, no scleral icterus, no conjunctival pallor. Heart: Regular rate and rhythm, S1/S2 +, no murmurs, rubs, gallops. Lungs: Clear to auscultation bilaterally. No wheezing, no rhonchi, no rales.  SKIN: left breast open wound draining pus Abdomen: Soft, nontender, nondistended, positive bowel sounds. Extremities: No clubbing or cyanosis, no pitting edema,  positive pedal pulses. Neuro: Grossly nonfocal.  Lab Results:  Lab 05/23/11 0826 05/22/11 1955 05/22/11 1405 05/16/11 1835  WBC 0.6* 0.5* 0.4* 23.3*  HGB 9.9* 10.5* 11.4* 12.5  HCT 29.6* 31.4* 34.7* 37.0  PLT 63* 68* 75* 196  MCV 85.5 84.9 86.9 85.5    Lab 05/23/11 0826 05/22/11 1955 05/22/11 1405 05/16/11 1835  NA 136 134* 135 137  K 3.4* 2.9* 3.6 3.8  CL 102 100 99 100  CO2 27 28 28 24   GLUCOSE 144* 208* 261* 294*  BUN 5* 7 13 17   CREATININE 0.47* 0.50 0.63 0.53  CALCIUM 8.5 8.4 9.3 9.4  MG 1.8 1.5 -- --    URINE CULTURE     Status: Normal   Collection Time   05/16/11  5:53 PM      Component Value Range Status Comment   Specimen Description URINE,   Final    Special Requests NONE   Final    Culture  Setup Time  [id number]   Final    Colony Count 45,000 COLONIES/ML   Final    Culture     Final    Value: GROUP B STREP(S.AGALACTIAE)ISOLATED   Report Status 05/17/2011 FINAL   Final     Studies/Results: Dg Chest 2 View 2011/06/02 IMPRESSION: No evidence of acute cardiopulmonary disease.     Medications: Scheduled Meds:   . acyclovir  400 mg Oral Q6H  . amLODipine  10 mg Oral Daily  . antiseptic oral rinse  15 mL Mouth Rinse BID  . atorvastatin  40 mg Oral q1800  . benazepril  40 mg Oral Daily  . cholecalciferol  2,000 Units Oral Daily  . docusate sodium  100 mg Oral BID  . fluconazole  100 mg Oral Daily  . imipenem-cilastatin  500 mg Intravenous Once  . imipenem-cilastatin  500 mg Intravenous Q6H  . insulin aspart  0-15 Units Subcutaneous TID WC  . insulin aspart  0-5  Units Subcutaneous QHS  . insulin glargine  15 Units Subcutaneous Daily  . linagliptin  5 mg Oral Daily  . linagliptin  5 mg Oral Once  . magnesium sulfate 1 - 4 g bolus IVPB  1 g Intravenous Once  . pantoprazole  40 mg Oral Q1200  . potassium chloride  10 mEq Intravenous Q1 Hr x 4  . sertraline  50 mg Oral Daily  . vancomycin  1,000 mg Intravenous Once  . vancomycin  1,000 mg Intravenous Q12H   Continuous Infusions:   . sodium chloride 250 mL (05/23/11 0500)  . DISCONTD: sodium chloride Stopped (06/02/2011 1953)   PRN Meds:.HYDROcodone-acetaminophen, LORazepam, morphine, ondansetron (ZOFRAN) IV, ondansetron, prochlorperazine    LOS: 1 day   Gittel Mccamish 05/23/2011, 11:29 AM  [facility] Pager: [phone]

## 2011-05-23 NOTE — Progress Notes (Signed)
PT Cancellation Note  ___Treatment cancelled today due to medical issues with patient which prohibited   therapy  ___ Treatment cancelled today due to patient receiving procedure or test   ___ Treatment cancelled today due to patient's refusal to participate   __x_ Treatment cancelled today due topt reports she is tto weak to participate. Pt has also had hydrotherapy treatment. F/U 05/25/11

## 2011-05-24 LAB — VANCOMYCIN, TROUGH: Vancomycin Tr: 9.8 ug/mL — ABNORMAL LOW (ref 10.0–20.0)

## 2011-05-24 LAB — GLUCOSE, CAPILLARY
Glucose-Capillary: 141 mg/dL — ABNORMAL HIGH (ref 70–99)
Glucose-Capillary: 158 mg/dL — ABNORMAL HIGH (ref 70–99)
Glucose-Capillary: 104 mg/dL — ABNORMAL HIGH (ref 70–99)
Glucose-Capillary: 166 mg/dL — ABNORMAL HIGH (ref 70–99)

## 2011-05-24 LAB — URINE CULTURE
Colony Count: NO GROWTH
Culture: NO GROWTH

## 2011-05-24 LAB — BASIC METABOLIC PANEL
BUN: 3 mg/dL — ABNORMAL LOW (ref 6–23)
Chloride: 105 mEq/L (ref 96–112)
GFR calc non Af Amer: >90 mL/min (ref >90)
Glucose, Bld: 122 mg/dL — ABNORMAL HIGH (ref 70–99)
Potassium: 3.2 mEq/L — ABNORMAL LOW (ref 3.5–5.1)

## 2011-05-24 LAB — CBC
HCT: 28.6 % — ABNORMAL LOW (ref 36.0–46.0)
Hemoglobin: 9.4 g/dL — ABNORMAL LOW (ref 12.0–15.0)
MCHC: 32.9 g/dL (ref 30.0–36.0)

## 2011-05-24 MED ORDER — VANCOMYCIN HCL 1000 MG IV SOLR
1250.0000 mg | Freq: Two times a day (BID) | INTRAVENOUS | Status: DC
  Administered 2011-05-25 – 2011-05-26 (×4): 1250 mg via INTRAVENOUS
  Filled 2011-05-24 (×6): qty 1250

## 2011-05-24 MED ORDER — POTASSIUM CHLORIDE CRYS ER 20 MEQ PO TBCR
20.0000 meq | EXTENDED_RELEASE_TABLET | Freq: Once | ORAL | Status: AC
  Administered 2011-05-24: 20 meq via ORAL
  Filled 2011-05-24: qty 1

## 2011-05-24 NOTE — Progress Notes (Signed)
Patient ID: [name], female   DOB: 11-02-1940, 71 y.o.   MRN: [id number]  Assessment/Plan   Principal Problem:   *Open wound of left breast  - status post mastectomy  - surgery following  - for now continue Primaxin and vanco  - continue fluconazole  - follow up blood cultures - no growth to date - procalcitonin and lactic acid level are within normal limits   Active Problems:   DIABETES MELLITUS  - sliding scale insulin  - continue CBG monitoring  - continue sitagliptin and insulin 15 units daily  - hold metformin   HYPERCHOLESTEROLEMIA  - continue atorvastatin   ANXIETY AND DEPRESSION  - continue ativan and sertraline   HYPERTENSION  - continue norvasc and benazepril   GERD  - continue protonix daily   Hypokalemia  - repleted today with 20 meq PO potassium chloride - follow up BMP in am   Breast cancer, ILC, left, receptor +, her 2 neg  - managed by Dr. Welton Flakes of oncology  - dexamethasone with chemotherapy   Pancytopenia  - secondary to history of breast cancer/ chemotherapy  - hemoglobin in 11.4 and platelets around 76  - no transfusion warranted at this time   DVT Prophylaxis - SCD bilaterally   Code Status - full code   Education  - test results and diagnostic studies were discussed with patient  - patient verbalized the understanding  - questions were answered at the bedside and contact information was provided for additional questions or concerns   Subjective: No events overnight.   Objective:  Vital signs in last 24 hours:  Filed Vitals:   05/23/11 0503 05/23/11 1454 05/23/11 2050 05/24/11 0515  BP: 131/64 113/70 151/74 121/69  Pulse: 87 78 85 69  Temp: 99.5 F (37.5 C) 98.4 F (36.9 C) 97.7 F (36.5 C) 99.5 F (37.5 C)  TempSrc: Oral Oral Oral Oral  Resp: 16 20 19 18   Height:      Weight: 83.779 kg (184 lb 11.2 oz)     SpO2: 95% 97% 98% 95%    Intake/Output from previous day:   Intake/Output Summary (Last 24 hours) at  05/24/11 1132 Last data filed at 05/24/11 0515  Gross per 24 hour  Intake   1080 ml  Output   1600 ml  Net   -520 ml    Physical Exam: General: Alert, awake, oriented x3, in no acute distress. HEENT: No bruits, no goiter. Moist mucous membranes, no scleral icterus, no conjunctival pallor. Heart: Regular rate and rhythm, S1/S2 +, no murmurs, rubs, gallops. Lungs: Clear to auscultation bilaterally; left breast wound Abdomen: Soft, nontender, nondistended, positive bowel sounds. Extremities: No clubbing or cyanosis, no pitting edema,  positive pedal pulses. Neuro: Grossly nonfocal.  Lab Results:  Lab 05/24/11 0624 05/23/11 0826 05/22/11 1955 05/22/11 1405  WBC 0.7* 0.6* 0.5* 0.4*  HGB 9.4* 9.9* 10.5* 11.4*  HCT 28.6* 29.6* 31.4* 34.7*  PLT 71* 63* 68* 75*  MCV 86.4 85.5 84.9 86.9    Lab 05/24/11 0624 05/23/11 0826 05/22/11 1955 05/22/11 1405  NA 139 136 134* 135  K 3.2* 3.4* 2.9* 3.6  CL 105 102 100 99  CO2 28 27 28 28   GLUCOSE 122* 144* 208* 261*  BUN 3* 5* 7 13  CREATININE 0.47* 0.47* 0.50 0.63  CALCIUM 8.4 8.5 8.4 9.3   URINE CULTURE     Status: Normal   Collection Time   05/16/11  5:53 PM      Component  Value Range Status Comment   Specimen Description URINE,  Final    Special Requests NONE   Final    Culture  Setup Time [id number]   Final    Colony Count 45,000 COLONIES/ML  Final    Culture     Final    Value: GROUP B STREP(S.AGALACTIAE)ISOLATED     Note: TESTING AGAINST S. AGALACTIAE NOT ROUTINELY PERFORMED DUE TO PREDICTABILITY OF AMP/PEN/VAN SUSCEPTIBILITY.   Report Status 05/17/2011 FINAL   Final   CULTURE, BLOOD (ROUTINE X 2)     Status: Normal (Preliminary result)   Collection Time   May 23, 2011  6:00 PM      Component Value Range Status Comment   Specimen Description BLOOD RIGHT ARM   Final    Special Requests BOTTLES DRAWN AEROBIC AND ANAEROBIC [facility] EACH   Final    Culture  Setup Time [id number]   Final    Culture     Final    Value:        BLOOD  CULTURE RECEIVED NO GROWTH TO DATE   Report Status PENDING   Incomplete   CULTURE, BLOOD (ROUTINE X 2)     Status: Normal (Preliminary result)   Collection Time   May 23, 2011  6:20 PM      Component Value Range Status Comment   Specimen Description BLOOD PORTA CATH   Final    Special Requests BOTTLES DRAWN AEROBIC AND ANAEROBIC 5 CC EACH   Final    Culture  Setup Time [id number]   Final    Culture     Final    Value:        BLOOD CULTURE RECEIVED NO GROWTH TO DATE    Report Status PENDING   Incomplete     Studies/Results: Dg Chest 2 View 05-23-2011   IMPRESSION: No evidence of acute cardiopulmonary disease.     Medications: Scheduled Meds:   . acyclovir  400 mg Oral Q6H  . amLODipine  10 mg Oral Daily  . antiseptic oral rinse  15 mL Mouth Rinse BID  . atorvastatin  40 mg Oral q1800  . benazepril  40 mg Oral Daily  . cholecalciferol  2,000 Units Oral Daily  . docusate sodium  100 mg Oral BID  . fluconazole  100 mg Oral Daily  . imipenem-cilastatin  500 mg Intravenous Q6H  . insulin aspart  0-15 Units Subcutaneous TID WC  . insulin aspart  0-5 Units Subcutaneous QHS  . insulin glargine  15 Units Subcutaneous Daily  . linagliptin  5 mg Oral Daily  . pantoprazole  40 mg Oral Q1200  . potassium chloride  20 mEq Oral Once  . sertraline  50 mg Oral Daily  . vancomycin  1,000 mg Intravenous Q12H   Continuous Infusions:   . sodium chloride 75 mL/hr at 05/24/11 0626   PRN Meds:.HYDROcodone-acetaminophen, LORazepam, morphine, ondansetron (ZOFRAN) IV, ondansetron, prochlorperazine    LOS: 2 days   Jyllian Haynie 05/24/2011, 11:32 AM  [facility] Pager: [phone]

## 2011-05-24 NOTE — Progress Notes (Signed)
ANTIBIOTIC CONSULT NOTE - FOLLOW UP  Pharmacy Consult for Vancomycin Indication: L Breast abscess s/p mastectomy  Allergies  Allergen Reactions  . Ceftriaxone Sodium     REACTION: hives  . Cephalexin     REACTION: hives  . Clindamycin     REACTION: hives  . Dilaudid (Hydromorphone Hcl) Itching  . Oxycodone-Acetaminophen     REACTION: hives  . Rocephin (Ceftriaxone Sodium In Dextrose) Hives  . Sulfonamide Derivatives     REACTION: hives    Patient Measurements: Height: 5\' 8"  (172.7 cm) Weight: 184 lb 11.2 oz (83.779 kg) IBW/kg (Calculated) : 63.9     Vital Signs: Temp: 98 F (36.7 C) (04/21 1450) Temp src: Oral (04/21 1450) BP: 112/65 mmHg (04/21 1450) Pulse Rate: 78  (04/21 1450) : 04/20 0701 - 04/21 0700 In: 1320 [P.O.:1320] Out: 1600 [Urine:1600] I    Labs:  Basename 05/24/11 0624 05/23/11 0826 05/22/11 1955  WBC 0.7* 0.6* 0.5*  HGB 9.4* 9.9* 10.5*  PLT 71* 63* 68*  LABCREA -- -- --  CREATININE 0.47* 0.47* 0.50   Estimated Creatinine Clearance: 74.3 ml/min (by C-G formula based on Cr of 0.47).  Basename 05/24/11 1758  VANCOTROUGH 9.8*  VANCOPEAK --  [name] --  GENTTROUGH --  GENTPEAK --  GENTRANDOM --  TOBRATROUGH --  [name] --  TOBRARND --  AMIKACINPEAK --  AMIKACINTROU --  AMIKACIN --     Microbiology: Recent Results (from the past 720 hour(s))  URINE CULTURE     Status: Normal   Collection Time   05/16/11  5:53 PM      Component Value Range Status Comment   Specimen Description URINE, CLEAN CATCH   Final    Special Requests NONE   Final    Culture  Setup Time [id number]   Final    Colony Count 45,000 COLONIES/ML   Final    Culture     Final    Value: GROUP B STREP(S.AGALACTIAE)ISOLATED     Note: TESTING AGAINST S. AGALACTIAE NOT ROUTINELY PERFORMED DUE TO PREDICTABILITY OF AMP/PEN/VAN SUSCEPTIBILITY.   Report Status 05/17/2011 FINAL   Final   CULTURE, BLOOD (ROUTINE X 2)     Status: Normal (Preliminary result)   Collection  Time   05/22/11  6:00 PM      Component Value Range Status Comment   Specimen Description BLOOD RIGHT ARM   Final    Special Requests BOTTLES DRAWN AEROBIC AND ANAEROBIC 5CC EACH   Final    Culture  Setup Time [id number]   Final    Culture     Final    Value:        BLOOD CULTURE RECEIVED NO GROWTH TO DATE CULTURE WILL BE HELD FOR 5 DAYS BEFORE ISSUING A FINAL NEGATIVE REPORT   Report Status PENDING   Incomplete   CULTURE, BLOOD (ROUTINE X 2)     Status: Normal (Preliminary result)   Collection Time   05/22/11  6:20 PM      Component Value Range Status Comment   Specimen Description BLOOD PORTA CATH   Final    Special Requests BOTTLES DRAWN AEROBIC AND ANAEROBIC 5 CC EACH   Final    Culture  Setup Time [id number]   Final    Culture     Final    Value:        BLOOD CULTURE RECEIVED NO GROWTH TO DATE CULTURE WILL BE HELD FOR 5 DAYS BEFORE ISSUING A FINAL NEGATIVE REPORT   Report Status PENDING  Incomplete   URINE CULTURE     Status: Normal   Collection Time   05/22/11  9:02 PM      Component Value Range Status Comment   Specimen Description URINE, RANDOM   Final    Special Requests NONE   Final    Culture  Setup Time [id number]   Final    Colony Count NO GROWTH   Final    Culture NO GROWTH   Final    Report Status 05/24/2011 FINAL   Final     Anti-infectives     Start     Dose/Rate Route Frequency Ordered Stop   05/23/11 0600   vancomycin (VANCOCIN) IVPB 1000 mg/200 mL premix        1,000 mg 200 mL/hr over 60 Minutes Intravenous Every 12 hours 05/22/11 1943     05/23/11 0000   imipenem-cilastatin (PRIMAXIN) 500 mg in sodium chloride 0.9 % 100 mL IVPB        500 mg 200 mL/hr over 30 Minutes Intravenous 4 times per day 05/22/11 1943     05/22/11 2000   acyclovir (ZOVIRAX) 200 MG capsule 400 mg  Status:  Discontinued        400 mg Oral 4 times per day 05/22/11 1928 05/22/11 1949   05/22/11 2000   fluconazole (DIFLUCAN) tablet 100 mg        100 mg Oral Daily 05/22/11 1928      05/22/11 2000   acyclovir (ZOVIRAX) tablet 400 mg        400 mg Oral 4 times per day 05/22/11 1948     05/22/11 1900   vancomycin (VANCOCIN) IVPB 1000 mg/200 mL premix        1,000 mg 200 mL/hr over 60 Minutes Intravenous  Once 05/22/11 1737 05/22/11 1955   05/22/11 1900   imipenem-cilastatin (PRIMAXIN) 500 mg in sodium chloride 0.9 % 100 mL IVPB        500 mg 200 mL/hr over 30 Minutes Intravenous  Once 05/22/11 1806 05/22/11 1921   05/22/11 1745   piperacillin-tazobactam (ZOSYN) IVPB 3.375 g  Status:  Discontinued        3.375 g 12.5 mL/hr over 240 Minutes Intravenous  Once 05/22/11 1737 05/22/11 1755          Assessment: 53 female with L breast abscess s/p mastectomy  Vancomycin level = 9.8 on 4/21 (at steady state) Goal of Therapy:  Vancomycin trough level 15-20 mcg/ml  Plan:   Will increase Vancomycin to 1250mg  IV q 12 hours as trough level was below goal range.  Obtain Vanc. Trough level after 3 doses at increased dosage, if therapy is continued.  [name] 05/24/2011,8:05 PM

## 2011-05-24 NOTE — ED Provider Notes (Signed)
Medical screening exam- pt seen in ED after being referred from cancer center for admission by [facility], Dr. Elisabeth Pigeon for neutropenia and wound infection. Pt is 6 weeks s/p mastectomy and has wound on left incision site of chest with foul smelling drainage.  Pt seen primarily and admitted by Dr. Elisabeth Pigeon in ED.    Ethelda Chick, MD 05/24/11 1859

## 2011-05-25 LAB — GLUCOSE, CAPILLARY
Glucose-Capillary: 183 mg/dL — ABNORMAL HIGH (ref 70–99)
Glucose-Capillary: 141 mg/dL — ABNORMAL HIGH (ref 70–99)

## 2011-05-25 LAB — BASIC METABOLIC PANEL
BUN: <3 mg/dL — ABNORMAL LOW (ref 6–23)
Calcium: 8.6 mg/dL (ref 8.4–10.5)
Chloride: 106 mEq/L (ref 96–112)
Creatinine, Ser: 0.5 mg/dL (ref 0.50–1.10)
GFR calc Af Amer: >90 mL/min (ref >90)

## 2011-05-25 LAB — CBC
HCT: 28.7 % — ABNORMAL LOW (ref 36.0–46.0)
MCH: 28.8 pg (ref 26.0–34.0)
MCV: 86.2 fL (ref 78.0–100.0)
Platelets: 97 10*3/uL — ABNORMAL LOW (ref 150–400)
RDW: 13.9 % (ref 11.5–15.5)

## 2011-05-25 MED ORDER — POTASSIUM CHLORIDE 10 MEQ/100ML IV SOLN
10.0000 meq | INTRAVENOUS | Status: AC
  Administered 2011-05-25 (×2): 10 meq via INTRAVENOUS
  Filled 2011-05-25 (×2): qty 100

## 2011-05-25 MED ORDER — ALPRAZOLAM 1 MG PO TABS
1.0000 mg | ORAL_TABLET | Freq: Three times a day (TID) | ORAL | Status: DC | PRN
  Administered 2011-05-25 – 2011-05-28 (×4): 1 mg via ORAL
  Filled 2011-05-25 (×4): qty 1

## 2011-05-25 NOTE — Progress Notes (Signed)
Open wound of left breast  Subjective: She is feeling better today. She notes less drainage from the wound.  Objective: Vital signs in last 24 hours: Temp:  [98 F (36.7 C)-98.7 F (37.1 C)] 98.7 F (37.1 C) (04/22 0505) Pulse Rate:  [78-83] 81  (04/22 0505) Resp:  [18] 18  (04/22 0505) BP: (112-139)/(65-76) 134/68 mmHg (04/22 0505) SpO2:  [94 %-97 %] 97 % (04/22 0505) Last BM Date: 05/22/11  Intake/Output from previous day: 04/21 0701 - 04/22 0700 In: 2415.3 [P.O.:1320; I.V.:662.3; IV Piggyback:433] Out: 1650 [Urine:1650] Intake/Output this shift:    General appearance: alert, cooperative and fatigued Wound: The wound remains open for a distance of about 3-4 cm long and 1 cm wide. It is fairly superficial. There remains a superficial purulence on it. There is no evidence surrounding cellulitis. (improved from when I last saw her about a week or 10 days ago.)  Lab Results:  Results for orders placed during the hospital encounter of 05/22/11 (from the past 24 hour(s))  GLUCOSE, CAPILLARY     Status: Abnormal   Collection Time   05/24/11  7:51 AM      Component Value Range   Glucose-Capillary 141 (*) 70 - 99 (mg/dL)   Comment 1 Documented in Chart     Comment 2 Notify RN    GLUCOSE, CAPILLARY     Status: Abnormal   Collection Time   05/24/11 11:39 AM      Component Value Range   Glucose-Capillary 158 (*) 70 - 99 (mg/dL)   Comment 1 Documented in Chart     Comment 2 Notify RN    GLUCOSE, CAPILLARY     Status: Abnormal   Collection Time   05/24/11  5:22 PM      Component Value Range   Glucose-Capillary 104 (*) 70 - 99 (mg/dL)   Comment 1 Documented in Chart     Comment 2 Notify RN    VANCOMYCIN, [place]     Status: Abnormal   Collection Time   05/24/11  5:58 PM      Component Value Range   Vancomycin Tr 9.8 (*) 10.0 - 20.0 (ug/mL)  GLUCOSE, CAPILLARY     Status: Abnormal   Collection Time   05/24/11  9:08 PM      Component Value Range   Glucose-Capillary 166 (*) 70 -  99 (mg/dL)   Comment 1 Notify RN    CBC     Status: Abnormal   Collection Time   05/25/11  5:55 AM      Component Value Range   WBC 1.8 (*) 4.0 - 10.5 (K/uL)   RBC 3.33 (*) 3.87 - 5.11 (MIL/uL)   Hemoglobin 9.6 (*) 12.0 - 15.0 (g/dL)   HCT 09.8 (*) 11.9 - 46.0 (%)   MCV 86.2  78.0 - 100.0 (fL)   MCH 28.8  26.0 - 34.0 (pg)   MCHC 33.4  30.0 - 36.0 (g/dL)   RDW 14.7  82.9 - 56.2 (%)   Platelets 97 (*) 150 - 400 (K/uL)  BASIC METABOLIC PANEL     Status: Abnormal   Collection Time   05/25/11  5:55 AM      Component Value Range   Sodium 142  135 - 145 (mEq/L)   Potassium 3.4 (*) 3.5 - 5.1 (mEq/L)   Chloride 106  96 - 112 (mEq/L)   CO2 29  19 - 32 (mEq/L)   Glucose, Bld 109 (*) 70 - 99 (mg/dL)   BUN <3 (*)  6 - 23 (mg/dL)   Creatinine, Ser 4.09  0.50 - 1.10 (mg/dL)   Calcium 8.6  8.4 - 81.1 (mg/dL)   GFR calc non Af Amer >90  >90 (mL/min)   GFR calc Af Amer >90  >90 (mL/min)     Studies/Results Radiology     MEDS, Scheduled    . acyclovir  400 mg Oral Q6H  . amLODipine  10 mg Oral Daily  . antiseptic oral rinse  15 mL Mouth Rinse BID  . atorvastatin  40 mg Oral q1800  . benazepril  40 mg Oral Daily  . cholecalciferol  2,000 Units Oral Daily  . docusate sodium  100 mg Oral BID  . fluconazole  100 mg Oral Daily  . imipenem-cilastatin  500 mg Intravenous Q6H  . insulin aspart  0-15 Units Subcutaneous TID WC  . insulin aspart  0-5 Units Subcutaneous QHS  . insulin glargine  15 Units Subcutaneous Daily  . linagliptin  5 mg Oral Daily  . pantoprazole  40 mg Oral Q1200  . potassium chloride  20 mEq Oral Once  . sertraline  50 mg Oral Daily  . vancomycin  1,250 mg Intravenous Q12H  . DISCONTD: vancomycin  1,000 mg Intravenous Q12H     Assessment: Open wound of left breast I think this open wound will eventually close. There wasan area of skin necrosis which was treated in the office. She went ahead with chemotherapy before it had completely healed and I think this has  significantly delayed healing of this area. It is superficially colonized but I don't think this is a true infection, just an open wound that needs local wound care. We had requested a home health nurse to help with wound care at home but this apparently never happened and the patient was unable to manage adequately at home. Because of her chemotherapy she likely has some systemic symptoms. However I think she'll continue to improve.   Plan: Continue local wound care. I think she needs to defer any further chemotherapy until this has had a better chance to heal likely to 3 weeks before her next chemotherapy.  I will see her again tomorrow in followup. She definitely needs home health care for wound care at home.  LOS: 3 days    Currie Paris, MD, [facility], [place] [phone]   05/25/2011 7:25 AM

## 2011-05-25 NOTE — Progress Notes (Signed)
INITIAL ADULT NUTRITION ASSESSMENT Date: 05/25/2011   Time: 3:21 PM Reason for Assessment: Nutrition risk   ASSESSMENT: Female 71 y.o.  Dx: Open wound of left breast  Hx:  Past Medical History  Diagnosis Date  . DIABETES MELLITUS 08/10/2007  . 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 HYPERCHOLESTEROLEMIA 12/17/2006  . OBESITY 06/04/2008  . ANXIETY 06/04/2008  . DEPRESSION 08/10/2007  . HYPERTENSION 12/17/2006  . ASTHMATIC BRONCHITIS, ACUTE 06/04/2008  . ALLERGIC RHINITIS 12/13/2007  . GERD 12/17/2006  . DEGENERATIVE JOINT DISEASE 12/17/2006  . Asthma   . Blood transfusion 1964    post childbirth  . 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 acyclovir  400 mg Oral Q6H  . amLODipine  10 mg Oral Daily  . antiseptic oral rinse  15 mL Mouth Rinse BID  . atorvastatin  40 mg Oral q1800  . benazepril  40 mg Oral Daily  . cholecalciferol  2,000 Units Oral Daily  . docusate sodium  100 mg Oral BID  . fluconazole  100 mg Oral Daily  . imipenem-cilastatin  500 mg Intravenous Q6H  . insulin aspart  0-15 Units Subcutaneous TID WC  . insulin aspart  0-5 Units Subcutaneous QHS  . insulin glargine  15 Units Subcutaneous Daily  . linagliptin  5 mg Oral Daily  . pantoprazole  40 mg Oral Q1200  . potassium chloride  10 mEq Intravenous Q1 Hr x 2  . sertraline  50 mg Oral Daily  . vancomycin  1,250 mg Intravenous Q12H  . DISCONTD: vancomycin  1,000 mg Intravenous Q12H   Continuous Infusions:   . sodium chloride 75 mL/hr at 05/24/11 2151   PRN Meds:.ALPRAZolam, HYDROcodone-acetaminophen, morphine, ondansetron (ZOFRAN) IV, ondansetron, prochlorperazine, DISCONTD: LORazepam  Ht: 5\' 8"  (172.7 cm)  Wt: 184 lb 11.2 oz (83.779 kg)  Ideal Wt: 140 lb % Ideal Wt: 131  Wt Readings from Last 10 Encounters:  05/23/11 184 lb 11.2 oz (83.779 kg)  05/22/11 185 lb 1.6 oz (83.961 kg)  05/15/11 181 lb (82.101 kg)  05/14/11 184 lb 11.2 oz (83.779 kg)  05/14/11 184  lb (83.462 kg)  05/08/11 183 lb 6 oz (83.178 kg)  05/06/11 186 lb 11.2 oz (84.687 kg)  05/01/11 191 lb 11.2 oz (86.955 kg)  04/24/11 192 lb 9.6 oz (87.363 kg)  04/22/11 195 lb (88.451 kg)   Usual Wt: 204 lb per pt report, however previous records indicate pt weighed 195 lb in January of 2013 % Usual Wt: 94 of 195 lb   Body mass index is 28.08 kg/(m^2).  Food/Nutrition Related Hx: Pt with breast CA s/p chemotherapy. Pt reports she grazes on food throughout the day and drinks 1 [employer] protein shake. Pt c/o some changes in taste from treatment and some dry mouth. Pt reports 20 pound unintended weight loss since surgery 6 weeks ago however previous records indicate an 11 pound unintended weight loss during this time. Noted pt's appetite slowly picking up and that pt ate 90% of lunch.   Labs:  CMP     Component Value Date/Time   NA 142 05/25/2011 0555   K 3.4* 05/25/2011 0555   CL 106 05/25/2011 0555   CO2 29 05/25/2011 0555   GLUCOSE 109* 05/25/2011 0555   BUN <3* 05/25/2011 0555   CREATININE 0.50 05/25/2011 0555   CALCIUM 8.6 05/25/2011 0555   PROT 5.3* 05/23/2011 0826   ALBUMIN 2.6* 05/23/2011 4098  AST 10 05/23/2011 0826   ALT 13 05/23/2011 0826   ALKPHOS 81 05/23/2011 0826   BILITOT 0.4 05/23/2011 0826   GFRNONAA >90 05/25/2011 0555   GFRAA >90 05/25/2011 0555   CBG (last 3)   Basename 05/25/11 1212 05/25/11 0757 05/24/11 2108  GLUCAP 183* 114* 166*   Lab Results  Component Value Date   HGBA1C 8.3* 02/06/2011    Intake/Output Summary (Last 24 hours) at 05/25/11 1531 Last data filed at 05/25/11 1428  Gross per 24 hour  Intake 2115.27 ml  Output    550 ml  Net 1565.27 ml   Last BM - 4/19  Diet Order: Carb Control   IVF:    sodium chloride Last Rate: 75 mL/hr at 05/24/11 2151    Estimated Nutritional Needs:   Kcal:1750-2050 Protein:85-100g Fluid:1.7-2L  NUTRITION DIAGNOSIS: -Inadequate oral intake (NI-2.1).  Status: Ongoing  RELATED TO: taste changes, dry mouth,  poor appetite  AS EVIDENCE BY: pt statement  MONITORING/EVALUATION(Goals): Pt to consistently consume >90% of meals.   EDUCATION NEEDS: -Education needs addressed - discussed diabetic diet as pt had questions regarding diabetic diet and also reviewed high protein food/beverage choices for wound healing. Provided handouts of information.   INTERVENTION: Encouraged pt to use Biotene and discussed different ways to improve intake. Encouraged increased intake. Will monitor.   Dietitian #: [phone]  DOCUMENTATION CODES Per approved criteria  -Not Applicable    Marshall Cork 05/25/2011, 3:21 PM

## 2011-05-25 NOTE — Progress Notes (Addendum)
Patient ID: [name], female   DOB: [birth year], 71 y.o.   MRN: [id number]  Interim Summary: 71 year old pleasant female with multiple medical history bloating but not limited to breast carcinoma status post mastectomy who was sent from Dr. Santo Held office of oncology about 3 days ago for persistent drainage from the mastectomy site. Patient has attempted by mouth antibiotics with no improvement.  Consults: 1. Surgery ( Dr. Jamey Ripa) 2. Physical therapy  Assessment/Plan   Principal Problem:   *Open wound of left breast  - status post mastectomy  - surgery following  - for now continue Primaxin and vanco (date 3) - continue fluconazole (possible yeast infection) - follow up blood cultures - no growth to date  - procalcitonin and lactic acid level on admission are within normal limits   Active Problems:   DIABETES MELLITUS  - sliding scale insulin  - continue CBG monitoring (183, 114, 166, 104) - continue sitagliptin and insulin 15 units daily  - hold metformin   HYPERCHOLESTEROLEMIA  - continue atorvastatin   ANXIETY AND DEPRESSION  - continue xanax  And sertraline   HYPERTENSION  - BP 134/68; at goal - continue norvasc and benazepril   GERD  - continue protonix daily   Hypokalemia  - repleted today with 20 meq IV potassium chloride  - follow up BMP in am   Breast cancer, ILC, left, receptor +, her 2 neg  - managed by Dr. Welton Flakes of oncology  - Per surgery recommendation is to hold off on chemotherapy until this wound gets better - dexamethasone with chemotherapy   Pancytopenia  - secondary to history of breast cancer/ chemotherapy  - hemoglobin is 9.6 and platelets around 97 (slowly trending up) - leukopenia is slowly resolving, WBC 1.8 today   DVT Prophylaxis - SCD bilaterally   Code Status - full code   Education  - test results and diagnostic studies were discussed with patient  - patient verbalized the understanding  - questions were answered at the  bedside and contact information was provided for additional questions or concerns   Disposition - follow up PT evaluation   Subjective: No events overnight. Patient denies chest pain, shortness of breath, abdominal pain.   Objective:  Vital signs in last 24 hours:  Filed Vitals:   05/24/11 0515 05/24/11 1450 05/24/11 2045 05/25/11 0505  BP: 121/69 112/65 139/76 134/68  Pulse: 69 78 83 81  Temp: 99.5 F (37.5 C) 98 F (36.7 C) 98.4 F (36.9 C) 98.7 F (37.1 C)  TempSrc: Oral Oral Oral Oral  Resp: 18 18 18 18   Height:      Weight:      SpO2: 95% 95% 94% 97%    Intake/Output from previous day:   Intake/Output Summary (Last 24 hours) at 05/25/11 1453 Last data filed at 05/25/11 5956  Gross per 24 hour  Intake 1875.27 ml  Output    550 ml  Net 1325.27 ml    Physical Exam: General: Alert, awake, oriented x3, in no acute distress. SKIN: Left breast wound, opened about 3 cm in diameter with less drainage and yesterday and less malodorous. Heart: Regular rate and rhythm, S1/S2 +, no murmurs, rubs, gallops. Lungs: Clear to auscultation bilaterally. No wheezing, no rhonchi, no rales.  Abdomen: Soft, nontender, nondistended, positive bowel sounds. Extremities: No clubbing or cyanosis, no pitting edema,  positive pedal pulses. Neuro: Grossly nonfocal.  Lab Results:  Lab 05/25/11 0555 05/24/11 0624 05/23/11 0826 05/22/11 1955 05/22/11 1405  WBC 1.8* 0.7*  0.6* 0.5* 0.4*  HGB 9.6* 9.4* 9.9* 10.5* 11.4*  HCT 28.7* 28.6* 29.6* 31.4* 34.7*  PLT 97* 71* 63* 68* 75*  MCV 86.2 86.4 85.5 84.9 86.9    Lab 05/25/11 0555 05/24/11 0624 05/23/11 0826 05/22/11 1955 05/22/11 1405  NA 142 139 136 134* 135  K 3.4* 3.2* 3.4* 2.9* 3.6  CL 106 105 102 100 99  CO2 29 28 27 28 28   GLUCOSE 109* 122* 144* 208* 261*  BUN <3* 3* 5* 7 13  CREATININE 0.50 0.47* 0.47* 0.50 0.63  CALCIUM 8.6 8.4 8.5 8.4 9.3   CULTURE, BLOOD (ROUTINE X 2)     Status: Normal (Preliminary result)   Collection  Time   05/22/11  6:00 PM      Component Value Range Status Comment   Specimen Description BLOOD RIGHT ARM   Final    Special Requests BOTTLES DRAWN AEROBIC AND ANAEROBIC 5CC EACH   Final    Culture  Setup Time [id number]   Final    Culture     Final    Value:        BLOOD CULTURE RECEIVED NO GROWTH TO DATE CULTURE WILL BE HELD FOR 5 DAYS BEFORE ISSUING A FINAL NEGATIVE REPORT   Report Status PENDING   Incomplete   CULTURE, BLOOD (ROUTINE X 2)     Status: Normal (Preliminary result)   Collection Time   05/22/11  6:20 PM      Component Value Range Status Comment   Specimen Description BLOOD PORTA CATH   Final    Special Requests BOTTLES DRAWN AEROBIC AND ANAEROBIC 5 CC EACH   Final    Culture  Setup Time [id number]   Final    Culture     Final    Value:        BLOOD CULTURE RECEIVED NO GROWTH TO DATE CULTURE WILL BE HELD FOR 5 DAYS BEFORE ISSUING A FINAL NEGATIVE REPORT   Report Status PENDING   Incomplete   URINE CULTURE     Status: Normal   Collection Time   05/22/11  9:02 PM      Component Value Range Status Comment   Specimen Description URINE, RANDOM   Final    Special Requests NONE   Final    Culture  Setup Time [id number]   Final    Colony Count NO GROWTH   Final    Culture NO GROWTH   Final    Report Status 05/24/2011 FINAL   Final    Medications: Scheduled Meds:   . acyclovir  400 mg Oral Q6H  . amLODipine  10 mg Oral Daily  . antiseptic oral rinse  15 mL Mouth Rinse BID  . atorvastatin  40 mg Oral q1800  . benazepril  40 mg Oral Daily  . cholecalciferol  2,000 Units Oral Daily  . docusate sodium  100 mg Oral BID  . fluconazole  100 mg Oral Daily  . imipenem-cilastatin  500 mg Intravenous Q6H  . insulin aspart  0-15 Units Subcutaneous TID WC  . insulin aspart  0-5 Units Subcutaneous QHS  . insulin glargine  15 Units Subcutaneous Daily  . linagliptin  5 mg Oral Daily  . pantoprazole  40 mg Oral Q1200  . potassium chloride  10 mEq Intravenous Q1 Hr x 2  .  sertraline  50 mg Oral Daily  . vancomycin  1,250 mg Intravenous Q12H  . DISCONTD: vancomycin  1,000 mg Intravenous Q12H   Continuous Infusions:   .  sodium chloride 75 mL/hr at 05/24/11 2151   PRN Meds:.ALPRAZolam, HYDROcodone-acetaminophen, morphine, ondansetron (ZOFRAN) IV, ondansetron, prochlorperazine, DISCONTD: LORazepam   LOS: 3 days   Taren Toops 05/25/2011, 2:53 PM  [facility] Pager: [phone]

## 2011-05-26 ENCOUNTER — Encounter (INDEPENDENT_AMBULATORY_CARE_PROVIDER_SITE_OTHER): Payer: Medicare Other | Admitting: Surgery

## 2011-05-26 ENCOUNTER — Telehealth (INDEPENDENT_AMBULATORY_CARE_PROVIDER_SITE_OTHER): Payer: Self-pay | Admitting: General Surgery

## 2011-05-26 LAB — BASIC METABOLIC PANEL
BUN: <3 mg/dL — ABNORMAL LOW (ref 6–23)
Creatinine, Ser: 0.51 mg/dL (ref 0.50–1.10)
GFR calc Af Amer: >90 mL/min (ref >90)
GFR calc non Af Amer: >90 mL/min (ref >90)

## 2011-05-26 LAB — CBC
HCT: 30.4 % — ABNORMAL LOW (ref 36.0–46.0)
MCHC: 32.9 g/dL (ref 30.0–36.0)
MCV: 87.1 fL (ref 78.0–100.0)
Platelets: 126 10*3/uL — ABNORMAL LOW (ref 150–400)
RDW: 14.2 % (ref 11.5–15.5)
WBC: 3.9 10*3/uL — ABNORMAL LOW (ref 4.0–10.5)

## 2011-05-26 LAB — GLUCOSE, CAPILLARY: Glucose-Capillary: 135 mg/dL — ABNORMAL HIGH (ref 70–99)

## 2011-05-26 MED ORDER — POTASSIUM CHLORIDE 10 MEQ/100ML IV SOLN
10.0000 meq | INTRAVENOUS | Status: AC
  Administered 2011-05-26 (×5): 10 meq via INTRAVENOUS
  Filled 2011-05-26 (×5): qty 100

## 2011-05-26 NOTE — Evaluation (Signed)
Physical Therapy Evaluation Patient Details Name: [name] MRN: [id number] DOB: 1940-10-22 Today's Date: 05/26/2011 Time:  -  EVAL performed 05/25/11 and documented in doc flowsheets.  Eval note added at this time  PT Assessment / Plan / Recommendation Clinical Impression       PT Assessment  Patient needs continued PT services    Follow Up Recommendations  No PT follow up    Equipment Recommendations  None recommended by PT    Frequency Min 3X/week    Precautions / Restrictions Precautions Precautions: Other (comment) (Left mastectomy Mar 23 2011) Restrictions Weight Bearing Restrictions: No   Pertinent Vitals/Pain       Mobility  Bed Mobility Bed Mobility: Supine to Sit Supine to Sit: 7: Independent Transfers Transfers: Not assessed (pt reports she has been up walking in the room on her own) Ambulation/Gait Ambulation/Gait Assistance: Not tested (comment);Other (comment) (pt reports she is up in the room OK, does not need PT)    Exercises Other Exercises Other Exercises: LUE diagonal active stretchin to shoulder Other Exercises: scapular AROM Other Exercises: gentle ant-post glide of glenohumeral joint   PT Goals Acute Rehab PT Goals PT Goal Formulation: With patient Time For Goal Achievement: 05/29/11 Potential to Achieve Goals: Good Pt will Perform Home Exercise Program: with supervision, verbal cues required/provided  Visit Information  Assistance Needed: +1    Subjective Data  Subjective: "I can't go to outpatient, is a $40 copay" Patient Stated Goal: to improve arm motion   Prior Functioning  Home Living Lives With: Family Available Help at Discharge: Family Type of Home: Mobile home Home Access: Stairs to enter Secretary/administrator of Steps: 3 Entrance Stairs-Rails: Right;Left;Can reach both Home Layout: One level Home Adaptive Equipment: None Prior Function Level of Independence: Independent Able to Take Stairs?:  Reciprically Communication Communication: No difficulties    Cognition  Overall Cognitive Status: Appears within functional limits for tasks assessed/performed Arousal/Alertness: Awake/alert Orientation Level: Oriented X4 / Intact Behavior During Session: [facility] for tasks performed    Extremity/Trunk Assessment Right Upper Extremity Assessment RUE ROM/Strength/Tone: Within functional levels Left Upper Extremity Assessment LUE ROM/Strength/Tone: Deficits;Due to pain LUE ROM/Strength/Tone Deficits: Pt appears to have decreased motion at glenohurmeral joint s/p mastectomy. She has about 50% of shoulder flexion and abdution with onlyu 10% of shoulder ext. rotation beyound neutral Right Lower Extremity Assessment RLE ROM/Strength/Tone: Within functional levels (per patient) Left Lower Extremity Assessment LLE ROM/Strength/Tone: Within functional levels (per patient)   Balance Balance Balance Assessed: No  End of Session PT - End of Session Activity Tolerance: Patient tolerated treatment well Patient left: in bed   Donnetta Hail 05/26/2011, 12:07 PM

## 2011-05-26 NOTE — Progress Notes (Signed)
Physical Therapy Treatment Patient Details Name: [name] MRN: [id number] DOB: 30-Nov-1940 Today's Date: 05/26/2011 Time: 1510-1540 PT Time Calculation (min): 30 min  Recommend HHPT and RW for home  PT Assessment / Plan / Recommendation Comments on Treatment Session  Pt agreed to walk today and demonstrated some decrease in balance and speed with generalized weakness. She would benefit from continued PT at [facility] and RW for home to increase safety and allow her to increase activity and strength    Follow Up Recommendations  Home health PT    Equipment Recommendations  Rolling walker with 5" wheels    Frequency Min 3X/week   Plan Discharge plan needs to be updated;Frequency remains appropriate    Precautions / Restrictions Precautions Precautions: Fall Precaution Comments: had left mastectomy 05/21/11/ wound L chest Restrictions Weight Bearing Restrictions: No       Mobility  Bed Mobility Bed Mobility: Supine to Sit Supine to Sit: 7: Independent Sit to Supine: 7: Independent Transfers Transfers: Sit to Stand;Stand to Sit Sit to Stand: 7: Independent Stand to Sit: 7: Independent Ambulation/Gait Ambulation/Gait Assistance: 5: Supervision Ambulation Distance (Feet): 150 Feet Assistive device: Rolling walker Ambulation/Gait Assistance Details: Pt does well with RW for balance support in gait.  Speed is decreased  and she has more difficulty with change in directions Gait Pattern: Step-through pattern Gait velocity: decreased Stairs: No Wheelchair Mobility Wheelchair Mobility: No    Exercises Other Exercises Other Exercises: Pt issued handout for core and LE exercises and was instructed  and able to demonstrate them. Hard copy placed in shadow chart   PT Goals Acute Rehab PT Goals PT Goal Formulation: With patient Time For Goal Achievement: 05/29/11 Potential to Achieve Goals: Good Pt will Ambulate: with modified independence;with least restrictive assistive  device;>150 feet PT Goal: Ambulate - Progress: Goal set today Pt will Go Up / Down Stairs: 3-5 stairs;with least restrictive assistive device;with modified independence PT Goal: Up/Down Stairs - Progress: Goal set today PT Goal: Perform Home Exercise Program - Progress: Progressing toward goal  Visit Information  Last PT Received On: 05/26/11 Assistance Needed: +1    Subjective Data  Subjective: I have a little balance problem   Cognition  Overall Cognitive Status: Appears within functional limits for tasks assessed/performed Arousal/Alertness: Awake/alert Orientation Level: Oriented X4 / Intact Behavior During Session: [facility] for tasks performed    Balance  Balance Balance Assessed: No Static Standing Balance Static Standing - Balance Support: Left upper extremity supported Static Standing - Level of Assistance: 4: Min assist;Other (comment) (min guard; lost balance during standing exercises at wall)  End of Session PT - End of Session Activity Tolerance: Patient tolerated treatment well Patient left: in chair;with call bell/phone within reach Nurse Communication: Mobility status    Donnetta Hail 05/26/2011, 4:33 PM

## 2011-05-26 NOTE — Progress Notes (Signed)
05/26/11 1045  Subjective Assessment  Subjective I'll be glad when this is over with  Evaluation and Treatment  Evaluation and Treatment Procedures Explained to Patient/Family Yes  Evaluation and Treatment Procedures agreed to  Wound 05/22/11 Dehisced Breast Left;Anterior incision from mastectomy has opened   Date First Assessed/Time First Assessed: 05/22/11 2240   Wound Type: Dehisced  Location: Breast  Location Orientation: Left;Anterior  Wound Description (Comments): incision from mastectomy has opened   Present on Admission: Yes  Site / Wound Assessment Pale;Yellow;Red  % Wound base Red or Granulating 25%  % Wound base Yellow 75%  % Wound base Black 0%  % Wound base Other (Comment) 0%  Peri-wound Assessment Erythema (blanchable)  Undermining (cm) pt continues with undermining at 9:00 position  Margins Unattacted edges (unapproximated)  Closure None  Drainage Amount Minimal  Drainage Description Sanguineous  Non-staged Wound Description Partial thickness  Treatment Cleansed;Hydrotherapy (Pulse lavage);Packing (Saline gauze)  Dressing Type Moist to dry;Silicone dressing  Dressing Changed Changed  Hydrotherapy  Pulsed Lavage with Suction (psi) 8 psi  Pulsed Lavage with Suction - Normal Saline Used 1000 mL  Pulsed Lavage Tip Tip with splash shield  Pulsed lavage therapy - wound location L mastectormy dehisced incision site  Wound Therapy - Assess/Plan/Recommendations  Wound Therapy - Clinical Statement Wound continues with adhered yellow slough.  Removal may be expedited by use of enzymatice debrider such as santly.  Will leave a message for Dr. Gaston Islam to consider and order if appropriate  Wound Therapy - Functional Problem List pt with some decreased ROM at left shoulder post mastectomy  Factors Delaying/Impairing Wound Healing Multiple medical problems;Other (comment)  Hydrotherapy Plan Pulsatile lavage with suction;Patient/family education  Wound Therapy - Frequency 6X / week    Wound Therapy - Follow Up Recommendations Home health RN  Wound Therapy Goals - Improve the function of patient's integumentary system by progressing the wound(s) through the phases of wound healing by:  Decrease Necrotic Tissue to 50%.   Decrease Necrotic Tissue - Progress Progressing toward goal  Increase Granulation Tissue to 50%  Increase Granulation Tissue - Progress Progressing toward goal  Wound Therapy - Potential for Goals Good

## 2011-05-26 NOTE — Progress Notes (Signed)
Physical Therapy Wound Treatment Patient Details  Name: [name] MRN: [id number] Date of Birth: 06-Jun-1940  Today's Date: 05/26/2011 Time:  -     Subjective     Pain Score: Pain Score: 0-No pain  Wound Assessment  Wound 05/14/11 Chest Left wound present upon arrival, packed, wet on dry dressing change - 10 cm long, in healing process, edges approximated, red, no odor noted (Active)  Site / Wound Assessment Granulation tissue 05/24/2011  9:24 AM  % Wound base Other (Comment) 40% 05/22/2011  7:50 PM  Margins Unattacted edges (unapproximated) 05/24/2011  9:24 AM  Drainage Amount Minimal 05/24/2011  9:24 AM  Drainage Description Purulent 05/24/2011  9:24 AM     Wound 05/22/11 Blister (Serous filled);Other (Comment) Sacrum Mid 1 intact 1 cm vesicle (Active)  Site / Wound Assessment Other (Comment) 05/25/2011  8:30 AM     Wound 05/22/11 Dehisced Breast Left;Anterior incision from mastectomy has opened  (Active)  Site / Wound Assessment Clean;Dry;Pale;Yellow 05/26/2011  7:43 AM  % Wound base Red or Granulating 50% 05/25/2011 11:10 AM  % Wound base Yellow 50% 05/25/2011 11:10 AM  % Wound base Black 0% 05/25/2011 11:10 AM  % Wound base Other (Comment) 0% 05/25/2011 11:10 AM  Peri-wound Assessment Erythema (blanchable) 05/25/2011 11:10 AM  Wound Length (cm) 1 cm 05/23/2011  2:11 PM  Wound Width (cm) 7 cm 05/23/2011  2:11 PM  Wound Depth (cm) 0.4 cm 05/22/2011 10:40 PM  Tunneling (cm) .2 05/22/2011 10:40 PM  Undermining (cm) 1.5 05/23/2011  2:11 PM  Margins Unattacted edges (unapproximated) 05/25/2011 11:10 AM  Closure None 05/25/2011 11:10 AM  Drainage Amount Minimal 05/25/2011 11:10 AM  Drainage Description Purulent 05/25/2011 11:10 AM  Treatment Cleansed;Hydrotherapy (Pulse lavage);Packing (Saline gauze) 05/25/2011 11:10 AM  Dressing Type Moist to dry;Silicone dressing 05/25/2011 11:10 AM  Dressing Changed Changed 05/25/2011 11:10 AM  Dressing Status Clean;Dry;Intact 05/24/2011  9:24 AM       Incision 03/23/11 Chest Left (Active)     Incision 04/13/11 Chest Right (Active)     Incision 04/13/11 Chest Left (Active)  Site / Wound Assessment Other (Comment) 05/26/2011  7:43 AM  Incision Length (cm) 9 cm 05/22/2011  7:44 PM  Margins Unattacted edges (unapproximated) 05/24/2011  9:24 AM  Closure None 05/24/2011  9:24 AM  Drainage Amount Scant 05/24/2011  4:00 PM  Drainage Description Purulent 05/24/2011  4:00 PM  Treatment Cleansed 05/24/2011  5:00 AM  Dressing Type Gauze (Comment) 05/25/2011  8:30 AM  Dressing Dry;Intact 05/26/2011  7:43 AM       Wound Assessment and Plan     Wound Therapy Goals- Improve the function of patient's integumentary system by progressing the wound(s) through the phases of wound healing (inflammation - proliferation - remodeling) by:    Goals will be updated until maximal potential achieved or discharge criteria met.  Discharge criteria: when goals achieved, discharge from hospital, MD decision/surgical intervention, no progress towards goals, refusal/missing three consecutive treatments without notification or medical reason.  [name] [facility] 05/26/2011, 10:11 AM

## 2011-05-26 NOTE — Telephone Encounter (Signed)
Message copied by Liliana Cline on Tue May 26, 2011  8:10 AM ------      Message from: Currie Paris      Created: Tue May 26, 2011  8:03 AM       She is at [facility] so can cancel her appointment with me this week, but I need to see next Tuesday. Please make the appointment and call her at [facility] - [phone] with the time.Thanks

## 2011-05-26 NOTE — Progress Notes (Signed)
Patient ID: [name], female   DOB: Apr 20, 1940, 72 y.o.   MRN: [id number]  Interim Summary:  71 year old pleasant female with multiple medical history bloating but not limited to breast carcinoma status post mastectomy who was sent from Dr. Santo Held office of oncology about 3 days ago for persistent drainage from the mastectomy site. Patient has attempted by mouth antibiotics with no improvement.   Consults:  1. Surgery ( Dr. Jamey Ripa)  2. Physical therapy  3. Wound care  Assessment/Plan   Principal Problem:   *Open wound of left breast  - status post history of recent mastectomy  - surgery following  - for now continue Primaxin and vanco (date 4); vancomycin can be discontinued at this point but we will leave primaxin until the discharge and on discharge this can be changed to levaquin perhaps for at least 7 days until patient is seen again by surgery at which time they can reassess the wound healing and decide if she needs longer course of antibiotics - patient was getting initially fluconazole for possible yeast infection however since blood cultures are negative and wound looks much better we will discontinue fluconazole - patient receiving hydra therapy to left breat wound and patient has a follow up scheduled with surgery 1 week from today 4/30 - follow up blood cultures - no growth to date  - procalcitonin and lactic acid level on admission are within normal limits   Active Problems:   DIABETES MELLITUS  - sliding scale insulin  - continue CBG monitoring (139, 135 183, 114)  - continue sitagliptin and insulin 15 units daily  - metformin held on admission but can be continued on discharge  HYPERCHOLESTEROLEMIA  - continue atorvastatin   ANXIETY AND DEPRESSION  - continue xanax And sertraline   HYPERTENSION  - BP 124/54; at goal  - continue norvasc and benazepril   GERD  - continue protonix daily   Hypokalemia  - repleted today with 10 meq IV x 5 doses   -  follow up BMP in am   Breast cancer, ILC, left, receptor +, her 2 neg  - managed by Dr. Welton Flakes of oncology  - Per surgery recommendation is to hold off on chemotherapy until this wound gets better  - dexamethasone with chemotherapy   Pancytopenia  - secondary to history of breast cancer/ chemotherapy  - hemoglobin is stable since the admission and platelets are optimistically trending up  - leukopenia is slowly resolving, WBC 3.9  today   DVT Prophylaxis - SCD bilaterally   Code Status - full code   Education  - test results and diagnostic studies were discussed with patient  - patient verbalized the understanding  - questions were answered at the bedside and contact information was provided for additional questions or concerns   Disposition  - follow up PT evaluation - home health PT and wound care  Subjective: No events overnight. Patient reports feeling weak and still not quite ready to go home.  Objective:  Vital signs in last 24 hours:  Filed Vitals:   05/26/11 0505 05/26/11 0914 05/26/11 1416 05/26/11 2113  BP: 140/74 149/72 113/67 125/54  Pulse: 85  79 83  Temp: 98.3 F (36.8 C)  98.2 F (36.8 C) 98.6 F (37 C)  TempSrc: Oral  Oral Oral  Resp: 18  18 18   Height:      Weight:      SpO2: 96%  95% 94%    Intake/Output from previous day:   Gross  per 24 hour  Intake    930 ml  Output      0 ml  Net    930 ml    Physical Exam: General: Alert, awake, oriented x3, in no acute distress. SKIN: left breast wound open with minimal drainage around 2 inches in diameter Heart: Regular rate and rhythm, S1/S2 +, no murmurs, rubs, gallops. Lungs: Clear to auscultation bilaterally. No wheezing, no rhonchi, no rales.  Abdomen: Soft, nontender, nondistended, positive bowel sounds. Extremities: No clubbing or cyanosis, no pitting edema,  positive pedal pulses. Neuro: Grossly nonfocal.  Lab Results:  Lab 05/26/11 0515 05/25/11 0555 05/24/11 0624 05/23/11 0826 05/22/11  1955  WBC 3.9* 1.8* 0.7* 0.6* 0.5*  HGB 10.0* 9.6* 9.4* 9.9* 10.5*  HCT 30.4* 28.7* 28.6* 29.6* 31.4*  PLT 126* 97* 71* 63* 68*  MCV 87.1 86.2 86.4 85.5 84.9    Lab 05/26/11 0515 05/25/11 0555 05/24/11 0624 05/23/11 0826 05/22/11 1955  NA 140 142 139 136 134*  K 3.0* 3.4* 3.2* 3.4* 2.9*  CL 104 106 105 102 100  CO2 30 29 28 27 28   GLUCOSE 124* 109* 122* 144* 208*  BUN <3* <3* 3* 5* 7  CREATININE 0.51 0.50 0.47* 0.47* 0.50  CALCIUM 8.7 8.6 8.4 8.5 8.4    Recent Results (from the past 240 hour(s))  CULTURE, BLOOD (ROUTINE X 2)     Status: Normal (Preliminary result)   Collection Time   05/22/11  6:00 PM      Component Value Range Status Comment   Specimen Description BLOOD RIGHT ARM   Final    Special Requests BOTTLES DRAWN AEROBIC AND ANAEROBIC 5CC EACH   Final    Culture  Setup Time [id number]   Final    Culture     Final    Value:        BLOOD CULTURE RECEIVED NO GROWTH TO DATE CULTURE WILL BE HELD FOR 5 DAYS BEFORE ISSUING A FINAL NEGATIVE REPORT   Report Status PENDING   Incomplete   CULTURE, BLOOD (ROUTINE X 2)     Status: Normal (Preliminary result)   Collection Time   05/22/11  6:20 PM      Component Value Range Status Comment   Specimen Description BLOOD PORTA CATH   Final    Special Requests BOTTLES DRAWN AEROBIC AND ANAEROBIC 5 CC EACH   Final    Culture  Setup Time [id number]   Final    Culture     Final    Value:        BLOOD CULTURE RECEIVED NO GROWTH TO DATE CULTURE WILL BE HELD FOR 5 DAYS BEFORE ISSUING A FINAL NEGATIVE REPORT   Report Status PENDING   Incomplete   URINE CULTURE     Status: Normal   Collection Time   05/22/11  9:02 PM      Component Value Range Status Comment   Specimen Description URINE, RANDOM   Final    Special Requests NONE   Final    Culture  Setup Time [id number]   Final    Colony Count NO GROWTH   Final    Culture NO GROWTH   Final    Report Status 05/24/2011 FINAL   Final     Studies/Results: No results  found.  Medications: Scheduled Meds:   . acyclovir  400 mg Oral Q6H  . amLODipine  10 mg Oral Daily  . antiseptic oral rinse  15 mL Mouth Rinse BID  . atorvastatin  40 mg Oral q1800  . benazepril  40 mg Oral Daily  . cholecalciferol  2,000 Units Oral Daily  . docusate sodium  100 mg Oral BID  . fluconazole  100 mg Oral Daily  . imipenem-cilastatin  500 mg Intravenous Q6H  . insulin aspart  0-15 Units Subcutaneous TID WC  . insulin aspart  0-5 Units Subcutaneous QHS  . insulin glargine  15 Units Subcutaneous Daily  . linagliptin  5 mg Oral Daily  . pantoprazole  40 mg Oral Q1200  . potassium chloride  10 mEq Intravenous Q1 Hr x 5  . sertraline  50 mg Oral Daily  . vancomycin  1,250 mg Intravenous Q12H   Continuous Infusions:   . sodium chloride 75 mL/hr at 05/25/11 1700   PRN Meds:.ALPRAZolam, HYDROcodone-acetaminophen, morphine, ondansetron (ZOFRAN) IV, ondansetron, prochlorperazine   LOS: 4 days   Stesha Neyens 05/26/2011, 9:16 PM  [facility] Pager: [phone]

## 2011-05-26 NOTE — Progress Notes (Signed)
Open wound of left breast  Subjective: Feels better  Objective: Vital signs in last 24 hours: Temp:  [98.1 F (36.7 C)-99.4 F (37.4 C)] 98.3 F (36.8 C) (04/23 0505) Pulse Rate:  [77-85] 85  (04/23 0505) Resp:  [18] 18  (04/23 0505) BP: (127-140)/(52-74) 140/74 mmHg (04/23 0505) SpO2:  [96 %-97 %] 96 % (04/23 0505) Last BM Date: 05/25/11  Intake/Output from previous day: 04/22 0701 - 04/23 0700 In: 540 [P.O.:540] Out: -  Intake/Output this shift:    General appearance: alert, cooperative and no distress Incision/Wound:  Lab Results:  Results for orders placed during the hospital encounter of 05/22/11 (from the past 24 hour(s))  GLUCOSE, CAPILLARY     Status: Abnormal   Collection Time   05/25/11  7:57 AM      Component Value Range   Glucose-Capillary 114 (*) 70 - 99 (mg/dL)   Comment 1 Documented in Chart     Comment 2 Notify RN    GLUCOSE, CAPILLARY     Status: Abnormal   Collection Time   05/25/11 12:12 PM      Component Value Range   Glucose-Capillary 183 (*) 70 - 99 (mg/dL)   Comment 1 Documented in Chart     Comment 2 Notify RN    GLUCOSE, CAPILLARY     Status: Abnormal   Collection Time   05/25/11  5:21 PM      Component Value Range   Glucose-Capillary 141 (*) 70 - 99 (mg/dL)   Comment 1 Documented in Chart     Comment 2 Notify RN    GLUCOSE, CAPILLARY     Status: Abnormal   Collection Time   05/25/11 10:09 PM      Component Value Range   Glucose-Capillary 156 (*) 70 - 99 (mg/dL)  CBC     Status: Abnormal   Collection Time   05/26/11  5:15 AM      Component Value Range   WBC 3.9 (*) 4.0 - 10.5 (K/uL)   RBC 3.49 (*) 3.87 - 5.11 (MIL/uL)   Hemoglobin 10.0 (*) 12.0 - 15.0 (g/dL)   HCT 16.1 (*) 09.6 - 46.0 (%)   MCV 87.1  78.0 - 100.0 (fL)   MCH 28.7  26.0 - 34.0 (pg)   MCHC 32.9  30.0 - 36.0 (g/dL)   RDW 04.5  40.9 - 81.1 (%)   Platelets 126 (*) 150 - 400 (K/uL)  BASIC METABOLIC PANEL     Status: Abnormal   Collection Time   05/26/11  5:15 AM   Component Value Range   Sodium 140  135 - 145 (mEq/L)   Potassium 3.0 (*) 3.5 - 5.1 (mEq/L)   Chloride 104  96 - 112 (mEq/L)   CO2 30  19 - 32 (mEq/L)   Glucose, Bld 124 (*) 70 - 99 (mg/dL)   BUN <3 (*) 6 - 23 (mg/dL)   Creatinine, Ser 9.14  0.50 - 1.10 (mg/dL)   Calcium 8.7  8.4 - 78.2 (mg/dL)   GFR calc non Af Amer >90  >90 (mL/min)   GFR calc Af Amer >90  >90 (mL/min)     Studies/Results Radiology     MEDS, Scheduled    . acyclovir  400 mg Oral Q6H  . amLODipine  10 mg Oral Daily  . antiseptic oral rinse  15 mL Mouth Rinse BID  . atorvastatin  40 mg Oral q1800  . benazepril  40 mg Oral Daily  . cholecalciferol  2,000 Units Oral Daily  .  docusate sodium  100 mg Oral BID  . fluconazole  100 mg Oral Daily  . imipenem-cilastatin  500 mg Intravenous Q6H  . insulin aspart  0-15 Units Subcutaneous TID WC  . insulin aspart  0-5 Units Subcutaneous QHS  . insulin glargine  15 Units Subcutaneous Daily  . linagliptin  5 mg Oral Daily  . pantoprazole  40 mg Oral Q1200  . potassium chloride  10 mEq Intravenous Q1 Hr x 2  . sertraline  50 mg Oral Daily  . vancomycin  1,250 mg Intravenous Q12H     Assessment: Open wound of left breast Wound cleaner and improving  Plan: Doing well. The wound will heal over a few weeks, but she should not get chemo again until it is closer to healed. The wound can be managed as an outpatient if she has help from home health. I will plan to see in office next Tuesday for wound check  LOS: 4 days    Currie Paris, MD, [facility], [place] [phone]   05/26/2011 7:25 AM

## 2011-05-26 NOTE — Evaluation (Signed)
Occupational Therapy Evaluation Patient Details Name: [name] MRN: [id number] DOB: 12-11-1940 Today's Date: 05/26/2011 Time: 9528-4132 OT Time Calculation (min): 25 min  OT Assessment / Plan / Recommendation Clinical Impression  This 71 year old female was admitted for L chest wound.  She is s/p mastectomy 05/21/11 and has shoulder tightness as well as decreased balance.  She has a h/o L shoulder fracture 2-3 years prior to this, but now has more tightness than she used to.  She is min guard for adls for balance, and she did lose balance once during OT eval while standing at the wall.  She would benefit from acute OT    OT Assessment  Patient needs continued OT Services    Follow Up Recommendations  Other (comment) (HHPT can likely monitor HEP)    Equipment Recommendations  None recommended by PT;None recommended by OT    Frequency Min 2X/week    Precautions / Restrictions Precautions Precautions: Fall Precaution Comments: had left mastectomy 05/21/11/ wound L chest Restrictions Weight Bearing Restrictions: No   Pertinent Vitals/Pain 0    ADL  Eating/Feeding: Simulated;Independent Where Assessed - Eating/Feeding: Chair Grooming: Simulated;Supervision/safety Where Assessed - Grooming: Standing at sink Upper Body Bathing: Simulated;Set up Where Assessed - Upper Body Bathing: Sitting, chair;Unsupported Lower Body Bathing: Simulated;Minimal assistance (min guard) Where Assessed - Lower Body Bathing: Sit to stand from chair Upper Body Dressing: Simulated;Minimal assistance;Other (comment) (picc line) Where Assessed - Upper Body Dressing: Sitting, chair;Unsupported Lower Body Dressing: Simulated;Minimal assistance;Other (comment) (min guard) Where Assessed - Lower Body Dressing: Sit to stand from chair Toilet Transfer: Simulated;Minimal assistance;Other (comment) (min guard) Toilet Transfer Method: Proofreader: Other (comment)  (recliner) Toileting - Clothing Manipulation: Simulated;Minimal assistance;Other (comment) (min guard) Where Assessed - Toileting Clothing Manipulation: Standing Toileting - Hygiene: Simulated;Minimal assistance;Other (comment) (min guard) Where Assessed - Toileting Hygiene: Standing Ambulation Related to ADLs: pt had no LOB with ambulating but did have posterior LOB when standing for LUE exercises; states she is a little unsteady at times ADL Comments: pt very independent.  Wants to get LUE moving better:  tight.  She also has h/o fx with pinning 2-3 years ago.  Tends to recruit scapula (traps elevate) when reaching beyond 50 degrees.    OT Goals Acute Rehab OT Goals OT Goal Formulation: With patient Time For Goal Achievement: 06/09/11 Potential to Achieve Goals: Good Arm Goals Additional Arm Goal #1: Pt will be independent with HEP focusing on stretching shoulder within pain tolerance, strengthening, and AAROM to stabilize scapula/dissociate from shoulder movements below 90 degrees Arm Goal: Additional Goal #1 - Progress: Goal set today Miscellaneous OT Goals Miscellaneous OT Goal #1: Pt will be supervision to gather clothes, adjust clothes and transfer to commode, with no LOB OT Goal: Miscellaneous Goal #1 - Progress: Goal set today  Visit Information  Last OT Received On: 05/26/11 Assistance Needed: +1    Subjective Data  Subjective: "I can play the piano" Patient Stated Goal: Get mobility back in left shoulder   Prior Functioning  Home Living Lives With: Family Available Help at Discharge: Family Type of Home: Mobile home Home Access: Stairs to enter Entrance Stairs-Number of Steps: 3 Entrance Stairs-Rails: Right;Left;Can reach both Home Layout: One level Home Adaptive Equipment: None Prior Function Level of Independence: Independent Able to Take Stairs?: Reciprically Communication Communication: No difficulties Dominant Hand: Right    Cognition  Overall Cognitive  Status: Appears within functional limits for tasks assessed/performed Arousal/Alertness: Awake/alert Orientation Level: Oriented X4 / Intact  Behavior During Session: [facility] for tasks performed    Extremity/Trunk Assessment Right Upper Extremity Assessment RUE ROM/Strength/Tone: Within functional levels Left Upper Extremity Assessment LUE ROM/Strength/Tone: Deficits LUE ROM/Strength/Tone Deficits:  (arom shoulder to 90 flexion/80 abduction with traps recruited; decreased internal and external rotation functionally) LUE Sensation: Deficits (numbness axilla s/p axillary dissection) LUE Coordination: WFL - fine motor Right Lower Extremity Assessment RLE ROM/Strength/Tone: Within functional levels (per patient) Left Lower Extremity Assessment LLE ROM/Strength/Tone: Within functional levels (per patient)   Mobility Bed Mobility Bed Mobility: Supine to Sit Supine to Sit: 7: Independent Sit to Supine: 7: Independent Transfers Transfers: Sit to Stand   Exercise Other Exercises Other Exercises: L flexion/abduction wall walking; pect stretch and dowel exercises supine to support scapula for flexion and external rotation; all performed in pain-free range without pulling to chest wound Other Exercises: scapular AROM   Balance Balance Balance Assessed: Yes Static Standing Balance Static Standing - Balance Support: Left upper extremity supported Static Standing - Level of Assistance: 4: Min assist;Other (comment) (min guard; lost balance during standing exercises at wall)  End of Session OT - End of Session Equipment Utilized During Treatment: Other (comment) (used reacher as a dowel) Activity Tolerance: Patient tolerated treatment well Patient left: in bed;with call bell/phone within reach  [facility], OTR/L [phone] 05/26/2011 [name] 05/26/2011, 2:59 PM

## 2011-05-26 NOTE — Telephone Encounter (Signed)
Called patient and made appt for next Tuesday.

## 2011-05-27 ENCOUNTER — Telehealth: Payer: Self-pay | Admitting: Medical Oncology

## 2011-05-27 DIAGNOSIS — R638 Other symptoms and signs concerning food and fluid intake: Secondary | ICD-10-CM | POA: Diagnosis present

## 2011-05-27 LAB — GLUCOSE, CAPILLARY
Glucose-Capillary: 142 mg/dL — ABNORMAL HIGH (ref 70–99)
Glucose-Capillary: 160 mg/dL — ABNORMAL HIGH (ref 70–99)

## 2011-05-27 LAB — BASIC METABOLIC PANEL
BUN: 3 mg/dL — ABNORMAL LOW (ref 6–23)
CO2: 31 mEq/L (ref 19–32)
Calcium: 9 mg/dL (ref 8.4–10.5)
Chloride: 105 mEq/L (ref 96–112)
Creatinine, Ser: 0.59 mg/dL (ref 0.50–1.10)

## 2011-05-27 LAB — CBC
HCT: 30.8 % — ABNORMAL LOW (ref 36.0–46.0)
MCHC: 32.8 g/dL (ref 30.0–36.0)
Platelets: 145 10*3/uL — ABNORMAL LOW (ref 150–400)
RDW: 14.3 % (ref 11.5–15.5)
WBC: 6.4 10*3/uL (ref 4.0–10.5)

## 2011-05-27 MED ORDER — BOOST PLUS PO LIQD
237.0000 mL | Freq: Two times a day (BID) | ORAL | Status: DC
  Administered 2011-05-28 (×2): 237 mL via ORAL
  Filled 2011-05-27 (×2): qty 237

## 2011-05-27 MED ORDER — MOXIFLOXACIN HCL 400 MG PO TABS
400.0000 mg | ORAL_TABLET | Freq: Every day | ORAL | Status: DC
  Administered 2011-05-27: 400 mg via ORAL
  Filled 2011-05-27 (×2): qty 1

## 2011-05-27 NOTE — Telephone Encounter (Signed)
Call from Marlowe Kays, [place], patient currently being treated as inpatient at [facility].  No consults required at this time. Recommendation are no chemotherapy until wound has healed.  Attending physician Dr. Darnelle Catalan, for further information please refer.  Patient to be seen 4/26 with midlevel with chemo scheduled to follow.  Will review with MD

## 2011-05-27 NOTE — Telephone Encounter (Signed)
please cancel patients lab,MD,infusion appointment 4/26 and injection 4/27 per son, patient in hospital with no plans for discharge at this time  Will review with MD

## 2011-05-27 NOTE — Progress Notes (Addendum)
Alert & responsive;  oriented x4. Physical Therapy in and  performed wound care on left breast; Pt S/P left mastectomy; pink yellow and red tissue noted; observed healing in progress. Physical therapy also ambulated patient down the hallway; no additional distress noted. Occupational therapy in to see pt; patient asked for them to come back in the afternoon. She c/o of fatigueness; Patient is now sitting in the chair talking to her visitor with no complaints of pain and/or discomfort. Eveline Sauve SN. [facility]/Arlinda Luiz Ochoa.

## 2011-05-27 NOTE — Progress Notes (Signed)
05/27/11 1030  Subjective Assessment  Subjective Is it getting better? (Is it getting better?)  Patient and Family Stated Goals to go home tomorrow  Evaluation and Treatment  Evaluation and Treatment Procedures Explained to Patient/Family Yes  Evaluation and Treatment Procedures agreed to  Wound 05/22/11 Dehisced Breast Left;Anterior incision from mastectomy has opened   Date First Assessed/Time First Assessed: 05/22/11 2240   Wound Type: Dehisced  Location: Breast  Location Orientation: Left;Anterior  Wound Description (Comments): incision from mastectomy has opened   Present on Admission: Yes  Site / Wound Assessment Pink Yellow;Red  % Wound base Red or Granulating 25%  % Wound base Yellow 75%  % Wound base Black 0%  % Wound base Other (Comment) 0%  Peri-wound Assessment Erythema (blanchable)  Undermining (cm) underming/tunnel at 9:00 position  Margins Unattacted edges (unapproximated)  Closure None  Drainage Amount Minimal  Drainage Description Sanguineous  Non-staged Wound Description Partial thickness  Treatment Cleansed;Hydrotherapy (Pulse lavage)  Dressing Type Moist to dry;Silicone dressing  Dressing Changed Changed  Hydrotherapy  Pulsed Lavage with Suction (psi) 8 psi  Pulsed Lavage with Suction - Normal Saline Used 1000 mL  Pulsed Lavage Tip Tip with splash shield  Pulsed lavage therapy - wound location L mastectormy dehisced incision site  Wound Therapy - Assess/Plan/Recommendations  Wound Therapy - Clinical Statement Wound continues with adhered yellow slough., though pink granulation buds are beginning to be more evident  Removal may be expedited by use of enzymatice debrider such as santly.  Will leave a message for Dr. Gaston Islam to consider and order if appropriate  Wound Therapy - Functional Problem List pt with some decreased ROM at left shoulder post mastectomy  Factors Delaying/Impairing Wound Healing Multiple medical problems;Other (comment)  Hydrotherapy Plan  Pulsatile lavage with suction;Patient/family education  Wound Therapy - Frequency 6X / week  Wound Therapy - Follow Up Recommendations Home health RN  Wound Therapy Goals - Improve the function of patient's integumentary system by progressing the wound(s) through the phases of wound healing by:  Decrease Necrotic Tissue to 50%.   Decrease Necrotic Tissue - Progress Progressing toward goal  Increase Granulation Tissue to 50%  Increase Granulation Tissue - Progress Progressing toward goal

## 2011-05-27 NOTE — Progress Notes (Signed)
OT Note:  Attempted tx twice this pm.  Pt sleeping soundly with visitors in room.  Will reattempt tomorrow am, if schedule permits.  [place], [place] [phone] 05/27/2011

## 2011-05-27 NOTE — H&P (Signed)
PROGRESS NOTE  [name] ZOX:[id number] DOB: 1940-05-28 DOA: 05/22/2011 PCP: Michele Mcalpine, MD, MD  Brief narrative: [name] is a 71 year old female with past medical history of stage III invasive lobular breast cancer status post left mastectomy and left axillary lymph node dissection on 03/23/2011 who presented to the hospital on 05/22/2011 for evaluation of left breast wound draining pus.  Assessment/Plan: Principal Problem:  *Open wound of left breast  Status post left mastectomy 03/23/2011.  Surgical consultation performed on 05/23/2011 by Dr. Gerrit Friends.  Wet-to-dry dressing changes 3 times a day.  PT hydrotherapy for wound therapy.  Primaxin and vancomycin started on admission.  Status post 5 days of treatment with Fluconazole for possible yeast infection.  Blood cultures negative to date.  Admission lactic acid and pro-calcitonin levels not appreciably elevated.  Defer chemotherapy for at least 3 weeks.  Home health care for wound at discharge.  Followup with Dr. Jamey Ripa 06/02/2011.  We'll switch to oral therapy today, monitor an additional 24 hours, and discharge home tomorrow if she is able to tolerate the switch to oral therapy. Active Problems:  DIABETES MELLITUS  Sliding scale insulin.  Continue CBG monitoring.  Continue sitagliptin and insulin 15 units daily.  Hold metformin.  CBGs 125-142.  HYPERCHOLESTEROLEMIA  Continue atorvastatin.  ANXIETY / DEPRESSION  Continue Ativan and sertraline.  HYPERTENSION  Continue Norvasc and benazepril.  Blood pressure 113-149/54-72  ASTHMATIC BRONCHITIS, ACUTE  Stable.  GERD  Continue protonix.  Breast cancer, ILC, left, receptor +, her 2 neg  Seen  by Dr. Welton Flakes 05/22/2011.  Pancytopenia  Secondary to recent chemotherapy.  Monitoring blood counts closely.  Hemoglobin 10.1 and platelets 145, stable.  White blood cell count is within the normal range.  Hypokalemia  Corrected with  supplementation.  Inadequate oral intake  Seen by the dietitian on 05/25/2011.  Biotene per recommendations.  Code Status: Full Family Communication: None at bedside. Disposition Plan: Home tomorrow.  Medical Consultants: Dr. Darnell Level, Surgery Dr. Drue Second, Oncology  Other Consultants:  Physical therapy: Home health PT and a rolling walker recommended.  Pharmacy: Antibiotic dosing.  Dietitian: Diminished oral intake.  Occupational therapy  Antibiotics:  Avelox 05/27/2011--->  Primaxin 05/22/2011---> 05/27/2011  Vancomycin 05/22/2011---> 05/26/2011  Acyclovir 05/22/2011--->  Diflucan 05/22/2011---> 05/26/2011  Zosyn 05/22/2011---> 05/22/2011   Subjective  [name] is concerned about some redness to her bilateral feet. Other than that, she is without significant complaints. No significant pain.   Objective    Interim History: Stable overnight.   Objective: Filed Vitals:   05/26/11 0914 05/26/11 1416 05/26/11 2113 05/27/11 0537  BP: 149/72 113/67 125/54 131/70  Pulse:  79 83 82  Temp:  98.2 F (36.8 C) 98.6 F (37 C) 98.3 F (36.8 C)  TempSrc:  Oral Oral Oral  Resp:  18 18 18   Height:      Weight:      SpO2:  95% 94% 96%    Intake/Output Summary (Last 24 hours) at 05/27/11 0751 Last data filed at 05/26/11 1416  Gross per 24 hour  Intake    870 ml  Output      0 ml  Net    870 ml    Exam: Gen:  NAD Cardiovascular:  RRR, No M/R/G Respiratory: Lungs CTAB Gastrointestinal: Abdomen soft, NT/ND with normal active bowel sounds. Extremities: No C/E/C, some spotty erythema to her bilateral feet. Left breast: Wound healing well with granulation tissue present.    Data Reviewed: Basic Metabolic Panel:  Lab 05/27/11 8119  05/26/11 0515 05/25/11 0555 05/24/11 0624 05/23/11 0826 05/22/11 1955  NA 143 140 142 139 136 --  K 3.6 3.0* -- -- -- --  CL 105 104 106 105 102 --  CO2 31 30 29 28 27  --  GLUCOSE 154* 124* 109* 122* 144* --  BUN 3* <3*  <3* 3* 5* --  CREATININE 0.59 0.51 0.50 0.47* 0.47* --  CALCIUM 9.0 8.7 8.6 8.4 8.5 --  MG -- -- -- -- 1.8 1.5  PHOS -- -- -- -- -- 1.9*   GFR Estimated Creatinine Clearance: 74.3 ml/min (by C-G formula based on Cr of 0.59). Liver Function Tests:  Lab 05/23/11 0826 05/22/11 1955 05/22/11 1405  AST 10 11 12   ALT 13 15 16   ALKPHOS 81 90 97  BILITOT 0.4 0.5 0.5  PROT 5.3* 5.5* 5.7*  ALBUMIN 2.6* 2.8* 3.5    CBC:  Lab 05/27/11 0130 05/26/11 0515 05/25/11 0555 05/24/11 0624 05/23/11 0826  WBC 6.4 3.9* 1.8* 0.7* 0.6*  NEUTROABS -- -- -- -- --  HGB 10.1* 10.0* 9.6* 9.4* 9.9*  HCT 30.8* 30.4* 28.7* 28.6* 29.6*  MCV 87.0 87.1 86.2 86.4 85.5  PLT 145* 126* 97* 71* 63*   CBG:  Lab 05/27/11 0742 05/26/11 2111 05/26/11 1651 05/26/11 1208 05/26/11 0730  GLUCAP 138* 142* 139* 135* 125*   Sepsis evaluation:  Ref. Range 05/22/2011 19:05  Lactic Acid, Venous Latest Range: 0.5-2.2 mmol/L 1.7  Procalcitonin No range found <0.10   Microbiology Recent Results (from the past 240 hour(s))  CULTURE, BLOOD (ROUTINE X 2)     Status: Normal (Preliminary result)   Collection Time   05/22/11  6:00 PM      Component Value Range Status Comment   Specimen Description BLOOD RIGHT ARM   Final    Special Requests BOTTLES DRAWN AEROBIC AND ANAEROBIC 5CC EACH   Final    Culture  Setup Time [id number]   Final    Culture     Final    Value:        BLOOD CULTURE RECEIVED NO GROWTH TO DATE CULTURE WILL BE HELD FOR 5 DAYS BEFORE ISSUING A FINAL NEGATIVE REPORT   Report Status PENDING   Incomplete   CULTURE, BLOOD (ROUTINE X 2)     Status: Normal (Preliminary result)   Collection Time   05/22/11  6:20 PM      Component Value Range Status Comment   Specimen Description BLOOD PORTA CATH   Final    Special Requests BOTTLES DRAWN AEROBIC AND ANAEROBIC 5 CC EACH   Final    Culture  Setup Time [id number]   Final    Culture     Final    Value:        BLOOD CULTURE RECEIVED NO GROWTH TO DATE CULTURE WILL BE  HELD FOR 5 DAYS BEFORE ISSUING A FINAL NEGATIVE REPORT   Report Status PENDING   Incomplete   URINE CULTURE     Status: Normal   Collection Time   05/22/11  9:02 PM      Component Value Range Status Comment   Specimen Description URINE, RANDOM   Final    Special Requests NONE   Final    Culture  Setup Time [id number]   Final    Colony Count NO GROWTH   Final    Culture NO GROWTH   Final    Report Status 05/24/2011 FINAL   Final     Procedures and Diagnostic Studies: Dg Chest 2  View  05/22/2011  *RADIOLOGY REPORT*  Clinical Data: Infection at left mastectomy site, fever, shortness of breath  CHEST - 2 VIEW  Comparison: 04/13/2011  Findings: Lungs are clear. No pleural effusion or pneumothorax.  Cardiomediastinal silhouette is within normal limits.  Right chest power port.  Status post left mastectomy and left axillary lymph node dissection.  Degenerative changes of the visualized thoracolumbar spine.  Cholecystectomy clips.  IMPRESSION: No evidence of acute cardiopulmonary disease.  Original Report Authenticated By: Charline Bills, M.D.    Scheduled Meds:   . acyclovir  400 mg Oral Q6H  . amLODipine  10 mg Oral Daily  . antiseptic oral rinse  15 mL Mouth Rinse BID  . atorvastatin  40 mg Oral q1800  . benazepril  40 mg Oral Daily  . cholecalciferol  2,000 Units Oral Daily  . docusate sodium  100 mg Oral BID  . imipenem-cilastatin  500 mg Intravenous Q6H  . insulin aspart  0-15 Units Subcutaneous TID WC  . insulin aspart  0-5 Units Subcutaneous QHS  . insulin glargine  15 Units Subcutaneous Daily  . linagliptin  5 mg Oral Daily  . pantoprazole  40 mg Oral Q1200  . potassium chloride  10 mEq Intravenous Q1 Hr x 5  . sertraline  50 mg Oral Daily  . DISCONTD: fluconazole  100 mg Oral Daily  . DISCONTD: vancomycin  1,250 mg Intravenous Q12H   Continuous Infusions:   . sodium chloride 75 mL/hr at 05/25/11 1700      LOS: 5 days   Hillery Aldo, MD Pager [phone]  05/27/2011, 7:51 AM   Patient Teaching (information given to and reviewed with the patient): [name] 05/27/2011 Hospitalist: Dr. Trula Ore Dariel Betzer  Medical Issues and plan: Principal Problem:  *Open wound of left breast  Status post left mastectomy 03/23/2011.  Surgical consultation performed on 05/23/2011 by Dr. Gerrit Friends.  Wet-to-dry dressing changes 3 times a day.  PT hydrotherapy for wound therapy.  Primaxin and vancomycin started on admission.  Status post 5 days of treatment with Fluconazole for possible yeast infection.  Blood cultures negative to date.  Markers for severe infection not elevated.  Defer chemotherapy for at least 3 weeks.  Home health care for wound at discharge.  Followup with Dr. Jamey Ripa 06/02/2011. Active Problems:  DIABETES   Sliding scale insulin.  Continue CBG monitoring.  Continue sitagliptin and insulin 15 units daily.  Hold metformin. Can resume after discharge.  CBGs 125-142.  High cholesterol  Continue atorvastatin.  ANXIETY / DEPRESSION  Continue Ativan and sertraline.  High blood pressure  Continue Norvasc and benazepril.  Blood pressure 113-149/54-72.  Reflux  Continue protonix.  Breast cancer  Seen  by Dr. Welton Flakes 05/22/2011.  Low blood counts  Secondary to recent chemotherapy.  Monitoring blood counts closely.  Hemoglobin 10.1 and platelets 145, stable.  White blood cell count is within the normal range.  Low potassium  Corrected with supplementation.  Inadequate oral intake  Seen by the dietitian on 05/25/2011.  Biotene per recommendations.  Medical Consultants: Dr. Darnell Level, Surgery Dr. Drue Second, Oncology  Other Consultants:  Physical therapy: Home health PT and a rolling walker recommended.  Pharmacy: Antibiotic dosing.  Dietitian: Diminished oral intake.  Occupational therapy

## 2011-05-27 NOTE — Telephone Encounter (Signed)
Noted  

## 2011-05-27 NOTE — Progress Notes (Signed)
Physical Therapy Treatment Patient Details Name: [name] MRN: [id number] DOB: 15-Aug-1940 Today's Date: 05/27/2011 Time: 1045-1100 PT Time Calculation (min): 15 min  PT Assessment / Plan / Recommendation Comments on Treatment Session  Improvement in posture and general mobility noted today.  Pt still has difficulty with changing gait speed and continues to need PT 3x week at home to work on balance gait and LU ROM and strength as well as RN wound care    Follow Up Recommendations  Home health PT    Equipment Recommendations  Rolling walker with 5" wheels    Frequency Min 3X/week   Plan Frequency remains appropriate;Discharge plan remains appropriate    Precautions / Restrictions Precautions Precautions: Fall Precaution Comments: had left mastectomy 05/21/11/ wound L chest Restrictions Weight Bearing Restrictions: No        Mobility  Bed Mobility Bed Mobility: Supine to Sit Supine to Sit: 7: Independent Sit to Supine: 7: Independent Transfers Transfers: Sit to Stand;Stand to Sit Sit to Stand: 7: Independent Stand to Sit: 7: Independent Ambulation/Gait Ambulation/Gait Assistance: 5: Supervision Ambulation Distance (Feet): 200 Feet Assistive device: Rolling walker Ambulation/Gait Assistance Details: Focus today was on maintaining trunk balance and changing speed during gait.  She has better posture today, burt was unable to significantly increase gait speed Gait Pattern: Step-through pattern Gait velocity: decreased General Gait Details: using RW for safety with decreased speed Stairs: No Wheelchair Mobility Wheelchair Mobility: No    Exercises Other Exercises Other Exercises: Pt issued handout for core and LE exercises and was instructed  and able to demonstrate them. Hard copy placed in shadow chart Other Exercises: gentle ROM of left shoulder with prolonged stretch of supported shoulder flexion and abduction to prepare for positioning for radiation therapy.   Tightness in pec muscles noted   PT Goals Acute Rehab PT Goals PT Goal Formulation: With patient Time For Goal Achievement: 05/29/11 Potential to Achieve Goals: Good Pt will Ambulate: with modified independence;with least restrictive assistive device;>150 feet PT Goal: Ambulate - Progress: Progressing toward goal Pt will Go Up / Down Stairs: 3-5 stairs;with least restrictive assistive device;with modified independence PT Goal: Perform Home Exercise Program - Progress: Progressing toward goal  Visit Information  Last PT Received On: 05/27/11 Assistance Needed: +1    Subjective Data  Subjective: I feel better Patient Stated Goal: to go home tomorrow    Cognition  Overall Cognitive Status: Appears within functional limits for tasks assessed/performed Arousal/Alertness: Awake/alert Orientation Level: Oriented X4 / Intact Behavior During Session: [facility] for tasks performed    Balance  Balance Balance Assessed: No  End of Session PT - End of Session Activity Tolerance: Patient tolerated treatment well Patient left: in chair;with call bell/phone within reach Nurse Communication: Mobility status    Donnetta Hail 05/27/2011, 11:18 AM

## 2011-05-27 NOTE — Progress Notes (Signed)
CARE MANAGEMENT NOTE 05/27/2011  Patient:  [name], [name]   Account Number:  [id number]  Date Initiated:  05/26/2011  Documentation initiated by:  Kelsay Haggard  Subjective/Objective Assessment:   71 yo female admitted 05/22/11 with wound infection     Action/Plan:   D/C when medically stable   Anticipated DC Date:  05/29/2011   Anticipated DC Plan:  HOME W HOME HEALTH SERVICES      DC Planning Services  CM consult      [facility] Choice  HOME HEALTH   Choice offered to / List presented to:  C-1 Patient   DME arranged  [facility] Hurst      DME agency  [facility]     [facility] arranged  HH-1 RN  HH-2 PT      [facility] agency  [facility]   Status of service:  In process, will continue to follow   Comments:  05/27/11, Kathi Der RNC-MNN, BSN, [phone], CM received additional referral.  [facility] called with additional PT order.  Lucretia at [facility] contacted for DME.  Confirmation received. 05/26/11, Kathi Der RNC-MNN, BSN, [phone], CM received referral.  CM met with pt to offer choice for [facility] services. Pt states she has used [facility] in the past and would like to [place] them again.  CM called [facility] with order and confirmation received.  Pt states that her husband and 2 sons will be able to assist at home as needed.

## 2011-05-27 NOTE — Progress Notes (Signed)
Occupational Therapy Note Attempted to see patient at two different times today. First pt was still eating breakfast and requested OT check back later this morning. Second attempt pt had just finished working with PT. Will try back later today as schedule allows. Judithann Sauger OTR/L [phone] 05/27/2011

## 2011-05-28 ENCOUNTER — Telehealth: Payer: Self-pay | Admitting: Oncology

## 2011-05-28 ENCOUNTER — Ambulatory Visit: Payer: Medicare Other | Admitting: Endocrinology

## 2011-05-28 ENCOUNTER — Encounter: Payer: Self-pay | Admitting: Medical Oncology

## 2011-05-28 DIAGNOSIS — Z0289 Encounter for other administrative examinations: Secondary | ICD-10-CM

## 2011-05-28 LAB — GLUCOSE, CAPILLARY
Glucose-Capillary: 157 mg/dL — ABNORMAL HIGH (ref 70–99)
Glucose-Capillary: 230 mg/dL — ABNORMAL HIGH (ref 70–99)

## 2011-05-28 MED ORDER — COLLAGENASE 250 UNIT/GM EX OINT
TOPICAL_OINTMENT | Freq: Every day | CUTANEOUS | Status: DC
  Administered 2011-05-28: 10:00:00 via TOPICAL
  Filled 2011-05-28: qty 30

## 2011-05-28 MED ORDER — MOXIFLOXACIN HCL 400 MG PO TABS: 400.0000 mg | ORAL_TABLET | Freq: Every day | ORAL | Status: AC

## 2011-05-28 MED ORDER — COLLAGENASE 250 UNIT/GM EX OINT: TOPICAL_OINTMENT | Freq: Every day | CUTANEOUS | Status: AC

## 2011-05-28 MED ORDER — HEPARIN SOD (PORK) LOCK FLUSH 100 UNIT/ML IV SOLN: 500.0000 [IU] | INTRAVENOUS | Status: DC | PRN

## 2011-05-28 MED ORDER — HEPARIN SOD (PORK) LOCK FLUSH 100 UNIT/ML IV SOLN
INTRAVENOUS | Status: AC
  Administered 2011-05-28: 10:00:00
  Filled 2011-05-28: qty 5

## 2011-05-28 MED ORDER — ACYCLOVIR 200 MG PO CAPS: 400.0000 mg | ORAL_CAPSULE | Freq: Four times a day (QID) | ORAL | Status: DC

## 2011-05-28 NOTE — Progress Notes (Signed)
05/28/11 1220  Subjective Assessment  Subjective I'm going home today.  Wound 05/22/11 Dehisced Breast Left;Anterior incision from mastectomy has opened   Date First Assessed/Time First Assessed: 05/22/11 2240   Wound Type: Dehisced  Location: Breast  Location Orientation: Left;Anterior  Wound Description (Comments): incision from mastectomy has opened   Present on Admission: Yes  Site / Wound Assessment Pale;Yellow;Red  % Wound base Red or Granulating 25%  % Wound base Yellow 75%  % Wound base Black 0%  % Wound base Other (Comment) 0%  Peri-wound Assessment Erythema (blanchable)  Margins Unattacted edges (unapproximated)  Closure None  Drainage Amount Minimal  Drainage Description Sanguineous  Non-staged Wound Description Partial thickness  Treatment Cleansed;Hydrotherapy (Pulse lavage)  Dressing Type Moist to dry;Moisture barrier;Silicone dressing  Dressing Changed Changed  Dressing Status Clean  Incision 04/13/11 Chest Left  Date First Assessed/Time First Assessed: 04/13/11 0848   Location: Chest  Location Orientation: Left  Present on Admission: Yes  Dressing Dry;Intact  Hydrotherapy  Pulsed Lavage with Suction (psi) 8 psi  Pulsed Lavage with Suction - Normal Saline Used 1000 mL  Pulsed Lavage Tip Tip with splash shield  Pulsed lavage therapy - wound location L mastectormy dehisced incision site  Wound Therapy - Assess/Plan/Recommendations  Wound Therapy - Clinical Statement Wound continues with adhered yellow slough., though pink granulation buds are beginning to be more evident  Removal may be expedited by use of enzymatice debrider such as santly.  Will leave a message for Dr. Gaston Islam to consider and order if appropriate  Wound Therapy - Functional Problem List pt with some decreased ROM at left shoulder post mastectomy  Factors Delaying/Impairing Wound Healing Multiple medical problems;Other (comment)  Hydrotherapy Plan Pulsatile lavage with suction;Patient/family education    Wound Therapy - Frequency 6X / week  Wound Therapy - Follow Up Recommendations Home health RN  Wound Therapy Goals - Improve the function of patient's integumentary system by progressing the wound(s) through the phases of wound healing by:  Decrease Necrotic Tissue to 50%.   Increase Granulation Tissue to 50%  Increase Granulation Tissue - Progress Progressing toward goal  Wound Therapy - Potential for Goals Good

## 2011-05-28 NOTE — Progress Notes (Signed)
Physical Therapy Treatment Patient Details Name: [name] MRN: [id number] DOB: 04/09/1940 Today's Date: 05/28/2011 Time: 4098-1191 PT Time Calculation (min): 20 min  PT Assessment / Plan / Recommendation Comments on Treatment Session  Pt L shoulder ROM limited after L mastectomy to 90* flexion and 30* abduction in L shoulder- pt tried to compensate on abduction with shoulder raise.  AAROM and stretch X 5 reps in flex and abd. with L shoulder.  Massage X 5 min to L shoulder (focused on deltoid insertion)  Instructed pt to work on ROM of L shoulder with exercises and to inform/remind HH of need  to focus on exercises for shoulder ROM.    Follow Up Recommendations  Home health PT    Equipment Recommendations       Frequency     Plan Discharge plan remains appropriate    Precautions / Restrictions         Mobility       Exercises General Exercises - Upper Extremity Shoulder Flexion: AAROM;Left;5 reps (held 5 seconds for stretch, stopped at 90*) Shoulder ABduction: AAROM;Left;5 reps (held 5 seconds stretch, pt tried to compensate raising sh)   PT Goals Acute Rehab PT Goals PT Goal Formulation: With patient Pt will Perform Home Exercise Program: with supervision, verbal cues required/provided PT Goal: Perform Home Exercise Program - Progress: Progressing toward goal  Visit Information  Last PT Received On: 05/28/11 Assistance Needed: +1    Subjective Data  Subjective: pt just finished with PLS   Cognition       Balance     End of Session PT - End of Session Activity Tolerance: Patient tolerated treatment well;Patient limited by pain;Treatment limited secondary to medical complications (Comment) Patient left: in bed;with call bell/phone within reach    [place], SPTA 05/28/2011, 1:28 PM  Felecia Shelling  PTA [facility]  Acute  Rehab Pager     [phone]

## 2011-05-28 NOTE — Progress Notes (Signed)
Patient d/c instuctions rendered, patient verbalized understanding

## 2011-05-28 NOTE — Progress Notes (Signed)
Patient d/c home, stable, denies pain, alert and orientedx4.

## 2011-05-28 NOTE — Progress Notes (Signed)
Upon initial rounds; AOx4 laying in bed; vitals signs: O2:94 T:97.9 R:20 B/P:137/63. Patient received hydrotherapy around 10:45am and tolerated it well. Patient is currently receiving shoulder ROM at 11:20am that she is also tolerating very well. Patient for discharge today-Right PAC d/c flushed 0.9 NS/10 mL;Heparin 500 units IVP-adequate blood return prior to d/c of PAC-prep site with bandage and 2x2 dressing no active bleeding post removal  Cipriano Mile, [employer] Student Nurse/Arlinda Luiz Ochoa.

## 2011-05-28 NOTE — Telephone Encounter (Signed)
Cancelled the pt's appts for 4/26,and 05/30/2011 due to the pt is in the hospital

## 2011-05-28 NOTE — Progress Notes (Signed)
Called patient per MD and informed patient that she will need to be seen tomorrow and receive lab work.  Will follow up with Colman Cater at 10 AM per previous appointment and lab work at 930, advised patient to arrive at 915.  Patient verbalized understanding and read appointment times back to caller.

## 2011-05-28 NOTE — Progress Notes (Signed)
05/28/11, Kathi Der RNC-MNN, BSN, [phone],  CM received phone call from [facility] stating due to staffing issues they would not be able to provde [facility] services as previously agreed to.  CM spoke with pt again.  Pt states that she has also used [facility] for [facility] services and is willing to use them again.  Darl Pikes at [facility] contacted with [facility] orders and confirmation of services received.  Pt informed.  DME in room.

## 2011-05-28 NOTE — Discharge Summary (Signed)
Physician Discharge Summary  Patient ID: YLONDA STORR MRN: [id number] DOB/AGE: 71-Mar-1942 71 y.o.  Admit date: 05/22/2011 Discharge date: 05/28/2011  Primary Care Physician:  Michele Mcalpine, MD, MD   Discharge Diagnoses:    Present on Admission:  .DIABETES MELLITUS .HYPERCHOLESTEROLEMIA .DEPRESSION .ANXIETY .HYPERTENSION .ASTHMATIC BRONCHITIS, ACUTE .GERD .Breast cancer, ILC, left, receptor +, her 2 neg .Open wound of left breast .Pancytopenia .Inadequate oral intake  Discharge Medications:  Medication List  As of 05/28/2011  7:34 AM   STOP taking these medications         ciprofloxacin 500 MG tablet      fluconazole 100 MG tablet      LORazepam 0.5 MG tablet         TAKE these medications         acyclovir 200 MG capsule   Commonly known as: ZOVIRAX   Take 2 capsules (400 mg total) by mouth every 6 (six) hours. While awake.      amLODipine 10 MG tablet   Commonly known as: NORVASC   Take 1 tablet (10 mg total) by mouth daily.      atorvastatin 40 MG tablet   Commonly known as: LIPITOR   Take 1 tablet (40 mg total) by mouth daily.      benazepril 40 MG tablet   Commonly known as: LOTENSIN   Take 1 tablet (40 mg total) by mouth daily.      collagenase ointment   Commonly known as: SANTYL   Apply topically daily.      dexamethasone 4 MG tablet   Commonly known as: DECADRON   Take 2 tablets by mouth once a day on the day after chemotherapy and then take 2 tablets two times a day for 2 days. Take with food.      HYDROcodone-acetaminophen 5-325 MG per tablet   Commonly known as: NORCO   Take 1 tablet by mouth every 6 (six) hours as needed. Pain.      LANTUS SOLOSTAR [place]   Inject 15 Units into the skin daily.      lidocaine-prilocaine cream   Commonly known as: EMLA   Apply 1 application topically as needed. For pain      metFORMIN 500 MG 24 hr tablet   Commonly known as: GLUCOPHAGE-XR   TAKE ONE TABLET BY MOUTH TWICE DAILY      moxifloxacin  400 MG tablet   Commonly known as: AVELOX   Take 1 tablet (400 mg total) by mouth daily at 6 PM.      omeprazole 20 MG capsule   Commonly known as: PRILOSEC   Take 1 capsule (20 mg total) by mouth daily.      ondansetron 8 MG tablet   Commonly known as: ZOFRAN   Take 1 tablet two times a day as needed for nausea or vomiting starting on the third day after chemotherapy.      prochlorperazine 10 MG tablet   Commonly known as: COMPAZINE   Take 1 tablet (10 mg total) by mouth every 6 (six) hours as needed (Nausea or vomiting).      sertraline 50 MG tablet   Commonly known as: ZOLOFT   Take 1 tablet (50 mg total) by mouth daily.      sitaGLIPtin 50 MG tablet   Commonly known as: JANUVIA   Take 50 mg by mouth daily.      Vitamin D 2000 UNITS tablet   Take 2,000 Units by mouth daily.  Disposition and Follow-up: The patient is being discharged home. She will be set up with home health nursing for wound care. A rolling walker has been ordered for her. She is instructed to followup with Dr. Welton Flakes and Dr. Jamey Ripa next week, and with her PCP as needed.   Medical Consults:  Dr. Darnell Level, Surgery   Dr. Drue Second, Oncology  Other Consults:  Physical therapy: Home health PT and a rolling walker recommended.   Pharmacy: Antibiotic dosing.   Dietitian: Diminished oral intake.   Occupational therapy  Procedures and Diagnostic Studies:  Dg Chest 2 View  05/22/2011  *RADIOLOGY REPORT*  Clinical Data: Infection at left mastectomy site, fever, shortness of breath  CHEST - 2 VIEW  Comparison: 04/13/2011  Findings: Lungs are clear. No pleural effusion or pneumothorax.  Cardiomediastinal silhouette is within normal limits.  Right chest power port.  Status post left mastectomy and left axillary lymph node dissection.  Degenerative changes of the visualized thoracolumbar spine.  Cholecystectomy clips.  IMPRESSION: No evidence of acute cardiopulmonary disease.  Original Report  Authenticated By: Charline Bills, M.D.    Discharge Laboratory Values: Basic Metabolic Panel:  Lab 05/27/11 6578 05/26/11 0515 05/25/11 0555 05/24/11 0624 05/23/11 0826 05/22/11 1955  NA 143 140 142 139 136 --  K 3.6 3.0* -- -- -- --  CL 105 104 106 105 102 --  CO2 31 30 29 28 27  --  GLUCOSE 154* 124* 109* 122* 144* --  BUN 3* <3* <3* 3* 5* --  CREATININE 0.59 0.51 0.50 0.47* 0.47* --  CALCIUM 9.0 8.7 8.6 8.4 8.5 --  MG -- -- -- -- 1.8 1.5  PHOS -- -- -- -- -- 1.9*   GFR Estimated Creatinine Clearance: 74.3 ml/min (by C-G formula based on Cr of 0.59). Liver Function Tests:  Lab 05/23/11 0826 05/22/11 1955 05/22/11 1405  AST 10 11 12   ALT 13 15 16   ALKPHOS 81 90 97  BILITOT 0.4 0.5 0.5  PROT 5.3* 5.5* 5.7*  ALBUMIN 2.6* 2.8* 3.5    CBC:  Lab 05/27/11 0130 05/26/11 0515 05/25/11 0555 05/24/11 0624 05/23/11 0826  WBC 6.4 3.9* 1.8* 0.7* 0.6*  NEUTROABS -- -- -- -- --  HGB 10.1* 10.0* 9.6* 9.4* 9.9*  HCT 30.8* 30.4* 28.7* 28.6* 29.6*  MCV 87.0 87.1 86.2 86.4 85.5  PLT 145* 126* 97* 71* 63*   CBG:  Lab 05/27/11 2223 05/27/11 1705 05/27/11 1141 05/27/11 0742 05/26/11 2111  GLUCAP 157* 193* 160* 138* 142*   Microbiology Recent Results (from the past 240 hour(s))  CULTURE, BLOOD (ROUTINE X 2)     Status: Normal (Preliminary result)   Collection Time   05/22/11  6:00 PM      Component Value Range Status Comment   Specimen Description BLOOD RIGHT ARM   Final    Special Requests BOTTLES DRAWN AEROBIC AND ANAEROBIC 5CC EACH   Final    Culture  Setup Time [id number]   Final    Culture     Final    Value:        BLOOD CULTURE RECEIVED NO GROWTH TO DATE CULTURE WILL BE HELD FOR 5 DAYS BEFORE ISSUING A FINAL NEGATIVE REPORT   Report Status PENDING   Incomplete   CULTURE, BLOOD (ROUTINE X 2)     Status: Normal (Preliminary result)   Collection Time   05/22/11  6:20 PM      Component Value Range Status Comment   Specimen Description BLOOD PORTA CATH  Final    Special  Requests BOTTLES DRAWN AEROBIC AND ANAEROBIC 5 CC EACH   Final    Culture  Setup Time [id number]   Final    Culture     Final    Value:        BLOOD CULTURE RECEIVED NO GROWTH TO DATE CULTURE WILL BE HELD FOR 5 DAYS BEFORE ISSUING A FINAL NEGATIVE REPORT   Report Status PENDING   Incomplete   URINE CULTURE     Status: Normal   Collection Time   05/22/11  9:02 PM      Component Value Range Status Comment   Specimen Description URINE, RANDOM   Final    Special Requests NONE   Final    Culture  Setup Time [id number]   Final    Colony Count NO GROWTH   Final    Culture NO GROWTH   Final    Report Status 05/24/2011 FINAL   Final      Brief H and P: For complete details please refer to admission H and P, but in brief, Mrs. Scannell is a 71 year old female with past medical history of stage III invasive lobular breast cancer status post left mastectomy and left axillary lymph node dissection on 03/23/2011 who presented to the hospital on 05/22/2011 for evaluation of left breast wound draining pus.  Physical Exam at Discharge: BP 144/69  Pulse 79  Temp(Src) 97.8 F (36.6 C) (Oral)  Resp 16  Ht 5\' 8"  (1.727 m)  Wt 83.779 kg (184 lb 11.2 oz)  BMI 28.08 kg/m2  SpO2 94% Gen: NAD  Cardiovascular: RRR, No M/R/G  Respiratory: Lungs CTAB  Gastrointestinal: Abdomen soft, NT/ND with normal active bowel sounds.  Extremities: No C/E/C, some spotty erythema to her bilateral feet.  Left breast: Wound healing well with granulation tissue present.  Hospital Course:  Principal Problem:  *Open wound of left breast  Status post left mastectomy 03/23/2011.  Surgical consultation performed on 05/23/2011 by Dr. Gerrit Friends.  Wet-to-dry dressing changes 3 times a day ordered.  PT hydrotherapy for wound therapy given.  Primaxin and vancomycin started on admission, status post 6 days of therapy.  Status post 5 days of treatment with Fluconazole for possible yeast infection.  Blood cultures negative to date.   Admission lactic acid and pro-calcitonin levels not appreciably elevated.  Defer chemotherapy for at least 3 weeks, per Dr. Tenna Child recommendation.  Home health care for wound at discharge. Daily wet to dry dressing changes with Santyl to wound bed for enzymatic debridement Followup with Dr. Jamey Ripa 06/02/2011.  Antibiotics changed to Avelox on 05/27/2011, and the patient tolerated this well. Plan to discharge her home on an additional 7 days of therapy with Avelox. Active Problems:  DIABETES MELLITUS  Sliding scale insulin ordered on admission..  Metformin held and the patient was placed on sliding scale insulin while in the hospital.  She was continued on sitagliptin and Lantus insulin 15 units daily.  Resume home regimen at discharge. HYPERCHOLESTEROLEMIA  Continue atorvastatin. ANXIETY / DEPRESSION  Continue Ativan and sertraline. HYPERTENSION  Continue Norvasc and benazepril.  Blood pressure well controlled. ASTHMATIC BRONCHITIS, ACUTE  Stable. GERD  Continue protonix. Breast cancer, ILC, left, receptor +, her 2 neg  Seen by Dr. Welton Flakes 05/22/2011. Followup with Dr. Welton Flakes next week. Pancytopenia  Secondary to recent chemotherapy.  Monitor blood counts closely while in the hospital, and these remained stable. Hypokalemia  Corrected with supplementation. Inadequate oral intake  Seen by the dietitian on 05/25/2011.  Biotene per recommendations.  Diet:  Carbohydrate modified, heart healthy, low-sodium.  Activity:  Increase activity slowly.  Condition at Discharge:   Improved.  Time spent on Discharge:  35 minutes.  Signed: Dr. Trula Ore Zahrah Sutherlin Pager [phone] 05/28/2011, 7:34 AM    Randall An 05/28/2011  Treatment team:  Hospitalist: Dr. Trula Ore Kerry Odonohue Dr. Darnell Level, Surgery  Dr. Drue Second, Oncology  Medical Issues and plan: Principal Problem:  *Open wound of left breast  Status post left mastectomy 03/23/2011.  Surgical consultation performed on  05/23/2011 by Dr. Gerrit Friends.  Wet-to-dry dressing changes 3 times a day.  PT hydrotherapy for wound therapy.  Primaxin and vancomycin started on admission. Now on Avelox. Status post 5 days of treatment with Fluconazole for possible yeast infection.  Blood cultures negative to date.  Markers for severe infection not elevated.  Defer chemotherapy for at least 3 weeks.  Home health care for wound at discharge.  Followup with Dr. Jamey Ripa 06/02/2011. Active Problems:  DIABETES  Sliding scale insulin.  Continue CBG monitoring.  Continue sitagliptin and insulin 15 units daily.  Hold metformin. Can resume after discharge.  CBGs 138-193. High cholesterol  Continue atorvastatin. ANXIETY / DEPRESSION  Continue Ativan and sertraline. High blood pressure  Continue Norvasc and benazepril.  Blood pressure controlled. Reflux  Continue protonix. Breast cancer  Seen by Dr. Welton Flakes 05/22/2011. Informed of the need to delay chemo. Low blood counts  Secondary to recent chemotherapy.  Monitoring blood counts closely.  Hemoglobin 10.1 and platelets 145, stable.  White blood cell count is within the normal range. Low potassium  Corrected with supplementation. Inadequate oral intake  Seen by the dietitian on 05/25/2011.  Biotene per recommendations.

## 2011-05-29 ENCOUNTER — Ambulatory Visit: Payer: Medicare Other | Admitting: Family

## 2011-05-29 ENCOUNTER — Other Ambulatory Visit: Payer: Medicare Other | Admitting: Lab

## 2011-05-29 ENCOUNTER — Encounter: Payer: Self-pay | Admitting: Family

## 2011-05-29 ENCOUNTER — Ambulatory Visit: Payer: Medicare Other

## 2011-05-29 ENCOUNTER — Other Ambulatory Visit (HOSPITAL_BASED_OUTPATIENT_CLINIC_OR_DEPARTMENT_OTHER): Payer: Medicare Other | Admitting: Lab

## 2011-05-29 ENCOUNTER — Ambulatory Visit (HOSPITAL_BASED_OUTPATIENT_CLINIC_OR_DEPARTMENT_OTHER): Payer: Medicare Other | Admitting: Family

## 2011-05-29 ENCOUNTER — Ambulatory Visit (HOSPITAL_BASED_OUTPATIENT_CLINIC_OR_DEPARTMENT_OTHER): Payer: Medicare Other

## 2011-05-29 VITALS — BP 132/85 | HR 111 | Temp 97.5°F | Ht 68.0 in | Wt 186.1 lb

## 2011-05-29 DIAGNOSIS — C50319 Malignant neoplasm of lower-inner quadrant of unspecified female breast: Secondary | ICD-10-CM

## 2011-05-29 DIAGNOSIS — E860 Dehydration: Secondary | ICD-10-CM

## 2011-05-29 DIAGNOSIS — C50519 Malignant neoplasm of lower-outer quadrant of unspecified female breast: Secondary | ICD-10-CM

## 2011-05-29 DIAGNOSIS — Z170 Estrogen receptor positive status [ER+]: Secondary | ICD-10-CM

## 2011-05-29 DIAGNOSIS — C50919 Malignant neoplasm of unspecified site of unspecified female breast: Secondary | ICD-10-CM

## 2011-05-29 LAB — COMPREHENSIVE METABOLIC PANEL
ALT: 14 U/L (ref 0–35)
AST: 19 U/L (ref 0–37)
Alkaline Phosphatase: 124 U/L — ABNORMAL HIGH (ref 39–117)
Creatinine, Ser: 0.61 mg/dL (ref 0.50–1.10)
Sodium: 143 mEq/L (ref 135–145)
Total Bilirubin: 0.3 mg/dL (ref 0.3–1.2)
Total Protein: 5.9 g/dL — ABNORMAL LOW (ref 6.0–8.3)

## 2011-05-29 LAB — CULTURE, BLOOD (ROUTINE X 2): Culture: NO GROWTH

## 2011-05-29 LAB — CBC WITH DIFFERENTIAL/PLATELET
BASO%: 0.4 % (ref 0.0–2.0)
EOS%: 0.2 % (ref 0.0–7.0)
HCT: 37.1 % (ref 34.8–46.6)
LYMPH%: 17.6 % (ref 14.0–49.7)
MCH: 29 pg (ref 25.1–34.0)
MCHC: 33.1 g/dL (ref 31.5–36.0)
MCV: 87.6 fL (ref 79.5–101.0)
MONO#: 0.8 10*3/uL (ref 0.1–0.9)
MONO%: 8.8 % (ref 0.0–14.0)
NEUT%: 73 % (ref 38.4–76.8)
Platelets: 262 10*3/uL (ref 145–400)
RBC: 4.24 10*6/uL (ref 3.70–5.45)

## 2011-05-29 MED ORDER — SODIUM CHLORIDE 0.9 % IV SOLN
INTRAVENOUS | Status: AC
  Administered 2011-05-29: 11:00:00 via INTRAVENOUS

## 2011-05-29 NOTE — Progress Notes (Signed)
OFFICE PROGRESS NOTE  CC: Michele Mcalpine, MD, Dr. Blossom Hoops Streck Dr. Chipper Herb Dr. Romero Belling  DIAGNOSIS: 1. Stage IIIA invasive lobular carcinoma, left breast.   2. Infected left breast surgical site.   PRIOR THERAPY: 1. Originally seen by Dr. Welton Flakes 03/05/2011 for invasive lobular carcinoma, left breast, ER/PR+, HER-2/neu negative . Stage IIB (T3 N0 M0). 2. Left breast mastectomy with axillary lymph node dissection 03/23/2011. Final pathology revealed a 6.0 cm invasive lobular carcinoma with lymphovascular invasion and perineural invasion. Invasive tumor was 2.9 cm from the nearest margin, lobular carcinoma in situ was noted. Initially 6 lymph nodes were positive for metastatic carcinoma. Tumor deposits were 0.5 0.8 0.7 0.7 0.3 and 1.0 cm focal extracapsular tumor extension was noted. Tumor was grade 3. The final lymph node count was 9 out of 16 positive for metastatic disease. Tumor was ER+100% PR+100% HER-2/neu -, ratio 1.31 proliferation marker 18% Final pathologic stage pT3a pN2a, Stage IIIA.  3. Began adjuvant chemotherapy with [facility] 04/17/11  CURRENT THERAPY: Adjuvant chemotherapy, FEC 100 with Neulasta day 2. She will intially receive 4 cycles, followed by single agent taxol weekly x 12 weeks. Chemotherapy on hold for surgical site wound infection.  INTERVAL HISTORY: Was discharged yesterday after 1 week hospitalization for surgical site infection, left mastectomy. Was scheduled to receive wound care at home from [facility] but reportedly, "they never showed up". Pt says she was told by the agency that they were overbooked for wound care. She is quite distressed by the recent turn of events.   Received multiple antibiotics in the hospital, along with wound care. Has order for Home Health Physical Therapy to include wound care. Dr. Jamey Ripa advised holding chemotherapy for approximately two weeks to allow for wound healing. She is weak and unstable with ambulation, is not  eating or drinking adequate amounts by her admission.   PHYSICAL EXAMINATION: General: Well developed, well nourished, in no acute distress. Unstable with ambulation, accompanied by her son.  EENT: No ocular or oral lesions. No stomatitis.  Respiratory: Lungs are clear to auscultation bilaterally with normal respiratory movement and no accessory muscle use. Cardiac: No murmur, rub or tachycardia. Rhythm is regular. No upper or lower extremity edema.  GI: Abdomen is soft, no palpable hepatosplenomegaly. No fluid wave. No tenderness. Musculoskeletal: No kyphosis, no tenderness over the spine, ribs or hips. Lymph: No cervical, infraclavicular, axillary or inguinal adenopathy. Neuro: No focal neurological deficits. Psych: Alert and oriented X 3 appropriate mood and affect.   Skin: Left mastectomy incision is covered with gauze and tape. No redness surrounding the bandage. Bandage is not removed. No odor.   ECOG PERFORMANCE STATUS: 1 - Symptomatic but completely ambulatory  Blood pressure 132/85, pulse 111, temperature 97.5 F (36.4 C), height 5\' 8"  (1.727 m), weight 186 lb 1.6 oz (84.414 kg).O2 sat 97%.  LABORATORY DATA: Lab Results  Component Value Date   WBC 7.8 05/14/2011   HGB 13.1 05/14/2011   HCT 39.8 05/14/2011   PLT 178 05/14/2011   ASSESSMENT: 71 year old female with: 1. Invasive lobular carcinoma of the left breast, on adjuvant chemo with good tolerance.  2. Mild stomatitis with first round, resolved. 3. Shingles, resolved. 4. Recent left mastectomy wound infection requiring hospitalization. Receiving Home Health wound care.    PLAN:  1. Postpone chemotherapy for 2 weeks to allow wound healing.  2. IV fluids today, related to poor po intake.  3. Return to see me in 1 week for progress check.  4. Wound care  per [facility]. Follow-up with Dr. Jamey Ripa as scheduled.   All questions were answered. The patient knows to call the clinic with any problems, questions or concerns.

## 2011-05-30 ENCOUNTER — Ambulatory Visit: Payer: Medicare Other

## 2011-06-01 ENCOUNTER — Telehealth (INDEPENDENT_AMBULATORY_CARE_PROVIDER_SITE_OTHER): Payer: Self-pay

## 2011-06-01 ENCOUNTER — Telehealth (INDEPENDENT_AMBULATORY_CARE_PROVIDER_SITE_OTHER): Payer: Self-pay | Admitting: General Surgery

## 2011-06-01 NOTE — Telephone Encounter (Signed)
She wanted to let us know that they are seeing the pt daily for 14 days for wound care

## 2011-06-01 NOTE — Telephone Encounter (Signed)
Patient having some trouble with left shoulder limitations. Physical therapist, Olegario Messier, with [facility] requesting occupational therapy evaluation. I gave the okay and to call with any questions.

## 2011-06-02 ENCOUNTER — Encounter (INDEPENDENT_AMBULATORY_CARE_PROVIDER_SITE_OTHER): Payer: Self-pay | Admitting: Surgery

## 2011-06-02 ENCOUNTER — Ambulatory Visit (INDEPENDENT_AMBULATORY_CARE_PROVIDER_SITE_OTHER): Payer: Medicare Other | Admitting: Surgery

## 2011-06-02 VITALS — BP 114/76 | HR 68 | Temp 97.2°F | Resp 16 | Ht 68.0 in | Wt 182.0 lb

## 2011-06-02 DIAGNOSIS — Z9889 Other specified postprocedural states: Secondary | ICD-10-CM

## 2011-06-02 NOTE — Progress Notes (Signed)
Chief complaint postop visit with open wound at mastectomy site  History of present illness: This patient underwent a mastectomy several weeks ago. The medial aspect of the wound had a small area of skin loss that had started to heal but then after she started chemotherapy developed a superficial purulent drainage although no cellulitis. She was hospitalized for a few days but is now improved. She is continuing to do wound care and has not had another chemotherapy.  Exam: Vital signs: General: The patient is alert and appears much help here than her last visit here. Breasts: The medial aspect of the incision is still open about 3-4 mm wide and about 3 cm long. There is beginning of some granulation tissue and I think is a little bit smaller than it was last week in the hospital impression/that  Impression: Gradually healing wound  Plan: She'll continue local wound care. Home health is coming out to help with that. We see her in about two weeks and hopefully at that time she'll be okay to restart chemotherapy and she is now about two weeks behind getting her next dose.

## 2011-06-02 NOTE — Patient Instructions (Signed)
Continue local wound care. We need to see it again in two weeks and decide if you can restart chemo

## 2011-06-05 ENCOUNTER — Telehealth: Payer: Self-pay | Admitting: *Deleted

## 2011-06-05 ENCOUNTER — Ambulatory Visit (HOSPITAL_BASED_OUTPATIENT_CLINIC_OR_DEPARTMENT_OTHER): Payer: Medicare Other | Admitting: Family

## 2011-06-05 ENCOUNTER — Encounter: Payer: Self-pay | Admitting: Family

## 2011-06-05 ENCOUNTER — Other Ambulatory Visit (HOSPITAL_BASED_OUTPATIENT_CLINIC_OR_DEPARTMENT_OTHER): Payer: Medicare Other | Admitting: Lab

## 2011-06-05 ENCOUNTER — Ambulatory Visit (HOSPITAL_BASED_OUTPATIENT_CLINIC_OR_DEPARTMENT_OTHER): Payer: Medicare Other

## 2011-06-05 VITALS — BP 145/78 | HR 103 | Temp 97.5°F | Ht 68.0 in | Wt 184.5 lb

## 2011-06-05 DIAGNOSIS — C50519 Malignant neoplasm of lower-outer quadrant of unspecified female breast: Secondary | ICD-10-CM

## 2011-06-05 DIAGNOSIS — C50919 Malignant neoplasm of unspecified site of unspecified female breast: Secondary | ICD-10-CM

## 2011-06-05 DIAGNOSIS — R5383 Other fatigue: Secondary | ICD-10-CM

## 2011-06-05 DIAGNOSIS — R5381 Other malaise: Secondary | ICD-10-CM

## 2011-06-05 DIAGNOSIS — N61 Mastitis without abscess: Secondary | ICD-10-CM

## 2011-06-05 DIAGNOSIS — E860 Dehydration: Secondary | ICD-10-CM

## 2011-06-05 DIAGNOSIS — C50319 Malignant neoplasm of lower-inner quadrant of unspecified female breast: Secondary | ICD-10-CM

## 2011-06-05 DIAGNOSIS — T8149XA Infection following a procedure, other surgical site, initial encounter: Secondary | ICD-10-CM

## 2011-06-05 LAB — CBC WITH DIFFERENTIAL/PLATELET
BASO%: 1.3 % (ref 0.0–2.0)
EOS%: 0.2 % (ref 0.0–7.0)
LYMPH%: 13.6 % — ABNORMAL LOW (ref 14.0–49.7)
MCHC: 33.4 g/dL (ref 31.5–36.0)
MCV: 88.9 fL (ref 79.5–101.0)
MONO%: 12.2 % (ref 0.0–14.0)
Platelets: 342 10*3/uL (ref 145–400)
RBC: 4.2 10*6/uL (ref 3.70–5.45)
RDW: 17 % — ABNORMAL HIGH (ref 11.2–14.5)
nRBC: 0 % (ref 0–0)

## 2011-06-05 LAB — COMPREHENSIVE METABOLIC PANEL
ALT: 21 U/L (ref 0–35)
AST: 24 U/L (ref 0–37)
Alkaline Phosphatase: 98 U/L (ref 39–117)
Creatinine, Ser: 0.72 mg/dL (ref 0.50–1.10)
Total Bilirubin: 0.3 mg/dL (ref 0.3–1.2)

## 2011-06-05 MED ORDER — SODIUM CHLORIDE 0.9 % IV SOLN
Freq: Once | INTRAVENOUS | Status: AC
  Administered 2011-06-05: 10:00:00 via INTRAVENOUS

## 2011-06-05 NOTE — Progress Notes (Signed)
OFFICE PROGRESS NOTE  CC: Hailey Mcalpine, MD, Dr. Blossom Hoops Streck Dr. Chipper Herb Dr. Romero Belling  DIAGNOSIS: 1. Stage IIIA invasive lobular carcinoma, left breast.   2. Infected left breast surgical site.   PRIOR THERAPY: 1. Originally seen by Dr. Welton Flakes 03/05/2011 for invasive lobular carcinoma, left breast, ER/PR+, HER-2/neu negative . Stage IIB (T3 N0 M0). 2. Left breast mastectomy with axillary lymph node dissection 03/23/2011. Final pathology revealed a 6.0 cm invasive lobular carcinoma with lymphovascular invasion and perineural invasion. Invasive tumor was 2.9 cm from the nearest margin, lobular carcinoma in situ was noted. Initially 6 lymph nodes were positive for metastatic carcinoma. Tumor deposits were 0.5 0.8 0.7 0.7 0.3 and 1.0 cm focal extracapsular tumor extension was noted. Tumor was grade 3. The final lymph node count was 9 out of 16 positive for metastatic disease. Tumor was ER+100% PR+100% HER-2/neu -, ratio 1.31 proliferation marker 18% Final pathologic stage pT3a pN2a, Stage IIIA.  3. Began adjuvant chemotherapy with [facility] 04/17/11  CURRENT THERAPY: Adjuvant chemotherapy, FEC 100 with Neulasta day 2. She will intially receive 4 cycles, followed by single agent taxol weekly x 12 weeks. Chemotherapy on hold for surgical site wound infection.  INTERVAL HISTORY: Recently hospitalized for surgical site infection, left mastectomy. Being followed closely by Dr. Jamey Ripa. Per his note from office visit 06/02/11, chemo on hold for an additional 2 weeks. Is receiving wound care and physical therapy from [facility]. She is weak and unstable with ambulation, is not eating or drinking adequate amounts by her admission. Had low grade temperature (99.6) yesterday, afebrile this a.m.   PHYSICAL EXAMINATION: General: Well developed, well nourished, in no acute distress. Unstable with ambulation, alone at today's visit.  EENT: No ocular or oral lesions. No stomatitis.  Respiratory:  Lungs are clear to auscultation bilaterally with normal respiratory movement and no accessory muscle use. Cardiac: No murmur, rub or tachycardia. Rhythm is regular. No upper or lower extremity edema.  GI: Abdomen is soft, no palpable hepatosplenomegaly. No fluid wave. No tenderness. Musculoskeletal: No kyphosis, no tenderness over the spine, ribs or hips. Lymph: No cervical, infraclavicular, axillary or inguinal adenopathy. Neuro: No focal neurological deficits. Psych: Alert and oriented X 3 appropriate mood and affect.   Skin: Left mastectomy incision is covered with gauze and tape. Gauze is removed, no redness on the upper or lower flap. Area is moist. No exudate. Bandage is replaced. No odor.   ECOG PERFORMANCE STATUS: 1 - Symptomatic but completely ambulatory  Blood pressure 145/78, pulse 103, temperature 97.5 F (36.4 C), height 5\' 8"  (1.727 m), weight 184 lb 8 oz (83.689 kg).O2 sat 97%.  LABORATORY DATA: Lab Results  Component Value Date   WBC 7.8 05/14/2011   HGB 13.1 05/14/2011   HCT 39.8 05/14/2011   PLT 178 05/14/2011   ASSESSMENT: 71 year old female with: 1. Invasive lobular carcinoma of the left breast, adjuvant chemo on hold for surgical site infection.  2. Dehydration with poor fluid intake.  3. Weakness and fatigue.     PLAN:  1. Postpone chemotherapy for 2 weeks to allow wound healing.  2. IV fluids today, related to poor po intake.  3. Return to see me in May 16 to resume chemo, pending release by Dr. Jamey Ripa on 5/14 appt.  4. Wound care and physical therapy for strengthening per [facility].   All questions were answered. The patient knows to call the clinic with any problems, questions or concerns.

## 2011-06-05 NOTE — Telephone Encounter (Signed)
emailed michelle to set up treatment

## 2011-06-05 NOTE — Telephone Encounter (Signed)
Per e-mail from [place], I have scheduled treatment appts for the patient. Appts in computer, and Jacki Cones aware.  JMW

## 2011-06-05 NOTE — Telephone Encounter (Signed)
gave patient appointment for 06-18-2011 starting at 8:30am printed out caledar and gave to the patient in the chemo room on 06-05-2011

## 2011-06-11 ENCOUNTER — Encounter (INDEPENDENT_AMBULATORY_CARE_PROVIDER_SITE_OTHER): Payer: Self-pay | Admitting: Surgery

## 2011-06-16 ENCOUNTER — Encounter (INDEPENDENT_AMBULATORY_CARE_PROVIDER_SITE_OTHER): Payer: Self-pay | Admitting: Surgery

## 2011-06-16 ENCOUNTER — Ambulatory Visit (INDEPENDENT_AMBULATORY_CARE_PROVIDER_SITE_OTHER): Payer: Medicare Other | Admitting: Surgery

## 2011-06-16 VITALS — BP 126/80 | HR 80 | Temp 97.6°F | Resp 14 | Ht 68.0 in | Wt 186.8 lb

## 2011-06-16 DIAGNOSIS — S21009A Unspecified open wound of unspecified breast, initial encounter: Secondary | ICD-10-CM

## 2011-06-16 DIAGNOSIS — C50319 Malignant neoplasm of lower-inner quadrant of unspecified female breast: Secondary | ICD-10-CM

## 2011-06-16 DIAGNOSIS — S21002A Unspecified open wound of left breast, initial encounter: Secondary | ICD-10-CM

## 2011-06-16 NOTE — Progress Notes (Signed)
Patient of Dr. Tenna Child - s/p left mastectomy with some wound separation/ cellulitis at medial aspect of her incision.  She has delayed her chemo for a couple of weeks.    Filed Vitals:   06/16/11 0847  BP: 126/80  Pulse: 80  Temp: 97.6 F (36.4 C)  Resp: 14   The incision shows no surrounding erythema.  Minimal drainage.  The wound is about 1 mm wide and about 3 cm long.  Well-granulated.   Imp:  I feel that she can resume her chemo treatments.  Follow-up 2 weeks with Dr. Jamey Ripa.  Wilmon Arms. Corliss Skains, MD, [facility] Surgery  06/16/2011 9:15 AM

## 2011-06-17 ENCOUNTER — Telehealth: Payer: Self-pay | Admitting: Oncology

## 2011-06-17 ENCOUNTER — Other Ambulatory Visit: Payer: Self-pay | Admitting: Medical Oncology

## 2011-06-17 NOTE — Telephone Encounter (Signed)
S/w the pt and she is aware of her injection appt on 06/19/2011

## 2011-06-18 ENCOUNTER — Ambulatory Visit (HOSPITAL_BASED_OUTPATIENT_CLINIC_OR_DEPARTMENT_OTHER): Payer: Medicare Other | Admitting: Family

## 2011-06-18 ENCOUNTER — Encounter: Payer: Self-pay | Admitting: Family

## 2011-06-18 ENCOUNTER — Telehealth: Payer: Self-pay | Admitting: *Deleted

## 2011-06-18 ENCOUNTER — Other Ambulatory Visit (HOSPITAL_BASED_OUTPATIENT_CLINIC_OR_DEPARTMENT_OTHER): Payer: Medicare Other | Admitting: Lab

## 2011-06-18 ENCOUNTER — Ambulatory Visit (HOSPITAL_BASED_OUTPATIENT_CLINIC_OR_DEPARTMENT_OTHER): Payer: Medicare Other

## 2011-06-18 VITALS — BP 144/82 | HR 85 | Temp 97.9°F | Ht 68.0 in | Wt 186.3 lb

## 2011-06-18 DIAGNOSIS — Z5111 Encounter for antineoplastic chemotherapy: Secondary | ICD-10-CM

## 2011-06-18 DIAGNOSIS — C50319 Malignant neoplasm of lower-inner quadrant of unspecified female breast: Secondary | ICD-10-CM

## 2011-06-18 DIAGNOSIS — C50919 Malignant neoplasm of unspecified site of unspecified female breast: Secondary | ICD-10-CM

## 2011-06-18 LAB — CBC WITH DIFFERENTIAL/PLATELET
Eosinophils Absolute: 0.1 10*3/uL (ref 0.0–0.5)
LYMPH%: 18.1 % (ref 14.0–49.7)
MONO#: 0.5 10*3/uL (ref 0.1–0.9)
NEUT#: 3 10*3/uL (ref 1.5–6.5)
Platelets: 192 10*3/uL (ref 145–400)
RBC: 4.35 10*6/uL (ref 3.70–5.45)
WBC: 4.4 10*3/uL (ref 3.9–10.3)
nRBC: 0 % (ref 0–0)

## 2011-06-18 LAB — COMPREHENSIVE METABOLIC PANEL
BUN: 12 mg/dL (ref 6–23)
CO2: 29 mEq/L (ref 19–32)
Creatinine, Ser: 0.71 mg/dL (ref 0.50–1.10)
Glucose, Bld: 231 mg/dL — ABNORMAL HIGH (ref 70–99)
Sodium: 137 mEq/L (ref 135–145)
Total Bilirubin: 0.5 mg/dL (ref 0.3–1.2)
Total Protein: 6 g/dL (ref 6.0–8.3)

## 2011-06-18 MED ORDER — SODIUM CHLORIDE 0.9 % IV SOLN
Freq: Once | INTRAVENOUS | Status: AC
  Administered 2011-06-18: 10:00:00 via INTRAVENOUS

## 2011-06-18 MED ORDER — SODIUM CHLORIDE 0.9 % IV SOLN
150.0000 mg | Freq: Once | INTRAVENOUS | Status: AC
  Administered 2011-06-18: 150 mg via INTRAVENOUS
  Filled 2011-06-18: qty 5

## 2011-06-18 MED ORDER — HEPARIN SOD (PORK) LOCK FLUSH 100 UNIT/ML IV SOLN
500.0000 [IU] | Freq: Once | INTRAVENOUS | Status: AC | PRN
  Administered 2011-06-18: 500 [IU]
  Filled 2011-06-18: qty 5

## 2011-06-18 MED ORDER — SODIUM CHLORIDE 0.9 % IJ SOLN
10.0000 mL | INTRAMUSCULAR | Status: DC | PRN
  Administered 2011-06-18: 10 mL
  Filled 2011-06-18: qty 10

## 2011-06-18 MED ORDER — PALONOSETRON HCL INJECTION 0.25 MG/5ML
0.2500 mg | Freq: Once | INTRAVENOUS | Status: AC
  Administered 2011-06-18: 0.25 mg via INTRAVENOUS

## 2011-06-18 MED ORDER — FLUOROURACIL CHEMO INJECTION 2.5 GM/50ML
500.0000 mg/m2 | Freq: Once | INTRAVENOUS | Status: AC
  Administered 2011-06-18: 1050 mg via INTRAVENOUS
  Filled 2011-06-18: qty 21

## 2011-06-18 MED ORDER — DEXAMETHASONE SODIUM PHOSPHATE 4 MG/ML IJ SOLN
12.0000 mg | Freq: Once | INTRAMUSCULAR | Status: AC
  Administered 2011-06-18: 12 mg via INTRAVENOUS

## 2011-06-18 MED ORDER — SODIUM CHLORIDE 0.9 % IV SOLN
500.0000 mg/m2 | Freq: Once | INTRAVENOUS | Status: AC
  Administered 2011-06-18: 1040 mg via INTRAVENOUS
  Filled 2011-06-18: qty 52

## 2011-06-18 MED ORDER — EPIRUBICIN HCL CHEMO IV INJECTION 200 MG/100ML
100.0000 mg/m2 | Freq: Once | INTRAVENOUS | Status: AC
  Administered 2011-06-18: 208 mg via INTRAVENOUS
  Filled 2011-06-18: qty 104

## 2011-06-18 NOTE — Telephone Encounter (Signed)
lmonvm adviisng the pt of her may 23rd appts . test

## 2011-06-18 NOTE — Telephone Encounter (Deleted)
lmonvm adviisng the pt of her may appts

## 2011-06-18 NOTE — Telephone Encounter (Signed)
Per POF order I have scheduled treatmetn appts. Patient given schedule.  JMW

## 2011-06-18 NOTE — Progress Notes (Signed)
OFFICE PROGRESS NOTE  CC: Hailey Mcalpine, MD, Dr. Blossom Hoops Streck Dr. Chipper Herb Dr. Romero Belling  DIAGNOSIS: 1. Stage IIIA invasive lobular carcinoma, left breast.   2. Open wound, left breast surgical site.   PRIOR THERAPY: 1. Originally seen by Dr. Welton Flakes 03/05/2011 for invasive lobular carcinoma, left breast, ER/PR+, HER-2/neu negative . Stage IIB (T3 N0 M0). 2. Left breast mastectomy with axillary lymph node dissection 03/23/2011. Final pathology revealed a 6.0 cm invasive lobular carcinoma with lymphovascular invasion and perineural invasion. Invasive tumor was 2.9 cm from the nearest margin, lobular carcinoma in situ was noted. Initially 6 lymph nodes were positive for metastatic carcinoma. Tumor deposits were 0.5 0.8 0.7 0.7 0.3 and 1.0 cm focal extracapsular tumor extension was noted. Tumor was grade 3. The final lymph node count was 9 out of 16 positive for metastatic disease. Tumor was ER+100% PR+100% HER-2/neu -, ratio 1.31 proliferation marker 18% Final pathologic stage pT3a pN2a, Stage IIIA.  3. Began adjuvant chemotherapy with [facility] 04/17/11  CURRENT THERAPY: Adjuvant chemotherapy, FEC 100 with Neulasta day 2. She will intially receive 4 cycles, followed by single agent taxol weekly x 12 weeks. Chemotherapy has been on hold for surgical site wound infection for several weeks, will resume today.  INTERVAL HISTORY: Was seen by surgery yesterday and cleared to resume chemo. Has one more cycle of FEC-100, then will begin weekly Taxol in 3 weeks.  Says she feels better than she has in months. Has enjoyed the time off from chemo. Has been getting physical therapy at home for strengthening. Sates she feels stronger. No headache or blurred vision. No cough or shortness of breath. No abdominal pain or new bone pain. Bowel and bladder function are normal. Appetite is good, with adequate fluid intake. Remainder of the 10 point  review of systems is negative.   PHYSICAL  EXAMINATION: General: Well developed, well nourished, in no acute distress, alone at today's visit.  EENT: No ocular or oral lesions. No stomatitis.  Respiratory: Lungs are clear to auscultation bilaterally with normal respiratory movement and no accessory muscle use. Cardiac: No murmur, rub or tachycardia. Rhythm is regular. No upper or lower extremity edema.  GI: Abdomen is soft, no palpable hepatosplenomegaly. No fluid wave. No tenderness. Musculoskeletal: No kyphosis, no tenderness over the spine, ribs or hips. Lymph: No cervical, infraclavicular, axillary or inguinal adenopathy. Neuro: No focal neurological deficits. Psych: Alert and oriented X 3 appropriate mood and affect.   Skin: Left mastectomy incision is covered with gauze and tape. Gauze is removed, no redness on the upper or lower flap. Area of separation is 1 mm wide, 3 cm long, moist. No exudate. Granulation evident. Bandage is replaced. No odor.   ECOG PERFORMANCE STATUS: 1 - Symptomatic but completely ambulatory  There were no vitals taken for this visit.  LABORATORY DATA: Lab Results  Component Value Date   WBC 7.8 05/14/2011   HGB 13.1 05/14/2011   HCT 39.8 05/14/2011   PLT 178 05/14/2011   ASSESSMENT: 71 year old female with: 1. Invasive lobular carcinoma of the left breast, adjuvant chemo has been on hold for surgical site infection.   PLAN:  1. Resume chemo today, last cycle of FEC-100. Return next week for lab check only.  2. Will begin weekly Taxol in 3 weeks. 3. Neulasta injection tomorrow.     All questions were answered. The patient knows to call the clinic with any problems, questions or concerns.

## 2011-06-18 NOTE — Patient Instructions (Signed)
 Cancer Center Discharge Instructions for Patients Receiving Chemotherapy  Today you received the following chemotherapy agents Epirubicin, Fluorouracil and Cytoxan  To help prevent nausea and vomiting after your treatment, we encourage you to take your nausea medication Begin taking it at 7 pm and take it as often as prescribed for the next 24 to 72 hours.   If you develop nausea and vomiting that is not controlled by your nausea medication, call the clinic. If it is after clinic hours your family physician or the after hours number for the clinic or go to the Emergency Department.   BELOW ARE SYMPTOMS THAT SHOULD BE REPORTED IMMEDIATELY:  *FEVER GREATER THAN 100.5 F  *CHILLS WITH OR WITHOUT FEVER  NAUSEA AND VOMITING THAT IS NOT CONTROLLED WITH YOUR NAUSEA MEDICATION  *UNUSUAL SHORTNESS OF BREATH  *UNUSUAL BRUISING OR BLEEDING  TENDERNESS IN MOUTH AND THROAT WITH OR WITHOUT PRESENCE OF ULCERS  *URINARY PROBLEMS  *BOWEL PROBLEMS  UNUSUAL RASH Items with * indicate a potential emergency and should be followed up as soon as possible.  One of the nurses will contact you 24 hours after your treatment. Please let the nurse know about any problems that you may have experienced. Feel free to call the clinic you have any questions or concerns. The clinic phone number is [phone].   I have been informed and understand all the instructions given to me. I know to contact the clinic, my physician, or go to the Emergency Department if any problems should occur. I do not have any questions at this time, but understand that I may call the clinic during office hours   should I have any questions or need assistance in obtaining follow up care.    __________________________________________  _____________  __________ Signature of Patient or Authorized Representative            Date                   Time    __________________________________________ Nurse's  Signature

## 2011-06-19 ENCOUNTER — Ambulatory Visit (HOSPITAL_BASED_OUTPATIENT_CLINIC_OR_DEPARTMENT_OTHER): Payer: Medicare Other

## 2011-06-19 VITALS — BP 134/75 | HR 85 | Temp 96.7°F

## 2011-06-19 DIAGNOSIS — C50319 Malignant neoplasm of lower-inner quadrant of unspecified female breast: Secondary | ICD-10-CM

## 2011-06-19 MED ORDER — PEGFILGRASTIM INJECTION 6 MG/0.6ML
6.0000 mg | Freq: Once | SUBCUTANEOUS | Status: AC
  Administered 2011-06-19: 6 mg via SUBCUTANEOUS
  Filled 2011-06-19: qty 0.6

## 2011-06-19 NOTE — Progress Notes (Signed)
Patient here for Neulasta injection following restart of chemotherapy.  States she is doing well.  Taking fluids and eating.  Knows to call if she has any problems.

## 2011-06-25 ENCOUNTER — Other Ambulatory Visit: Payer: Self-pay | Admitting: Oncology

## 2011-06-25 ENCOUNTER — Telehealth: Payer: Self-pay | Admitting: *Deleted

## 2011-06-25 ENCOUNTER — Other Ambulatory Visit (HOSPITAL_BASED_OUTPATIENT_CLINIC_OR_DEPARTMENT_OTHER): Payer: Medicare Other | Admitting: Lab

## 2011-06-25 ENCOUNTER — Ambulatory Visit: Payer: Medicare Other

## 2011-06-25 ENCOUNTER — Encounter: Payer: Self-pay | Admitting: Family

## 2011-06-25 ENCOUNTER — Emergency Department (HOSPITAL_COMMUNITY): Payer: Medicare Other

## 2011-06-25 ENCOUNTER — Inpatient Hospital Stay (HOSPITAL_COMMUNITY)
Admission: EM | Admit: 2011-06-25 | Discharge: 2011-07-02 | DRG: 919 | Disposition: A | Discharge status: Home / Self Care | Payer: Medicare Other | Attending: Internal Medicine | Admitting: Internal Medicine

## 2011-06-25 ENCOUNTER — Inpatient Hospital Stay (HOSPITAL_COMMUNITY): Payer: Medicare Other

## 2011-06-25 ENCOUNTER — Encounter (HOSPITAL_COMMUNITY): Payer: Self-pay | Admitting: Family Medicine

## 2011-06-25 ENCOUNTER — Ambulatory Visit (HOSPITAL_BASED_OUTPATIENT_CLINIC_OR_DEPARTMENT_OTHER): Payer: Medicare Other | Admitting: Family

## 2011-06-25 VITALS — BP 123/74 | HR 98 | Temp 98.6°F | Ht 68.0 in | Wt 182.9 lb

## 2011-06-25 DIAGNOSIS — E1165 Type 2 diabetes mellitus with hyperglycemia: Secondary | ICD-10-CM | POA: Diagnosis present

## 2011-06-25 DIAGNOSIS — Y838 Other surgical procedures as the cause of abnormal reaction of the patient, or of later complication, without mention of misadventure at the time of the procedure: Secondary | ICD-10-CM | POA: Diagnosis present

## 2011-06-25 DIAGNOSIS — K219 Gastro-esophageal reflux disease without esophagitis: Secondary | ICD-10-CM | POA: Diagnosis present

## 2011-06-25 DIAGNOSIS — E118 Type 2 diabetes mellitus with unspecified complications: Secondary | ICD-10-CM

## 2011-06-25 DIAGNOSIS — B370 Candidal stomatitis: Secondary | ICD-10-CM | POA: Diagnosis present

## 2011-06-25 DIAGNOSIS — C50919 Malignant neoplasm of unspecified site of unspecified female breast: Secondary | ICD-10-CM | POA: Diagnosis present

## 2011-06-25 DIAGNOSIS — E871 Hypo-osmolality and hyponatremia: Secondary | ICD-10-CM | POA: Diagnosis present

## 2011-06-25 DIAGNOSIS — B3781 Candidal esophagitis: Secondary | ICD-10-CM | POA: Diagnosis present

## 2011-06-25 DIAGNOSIS — E7800 Pure hypercholesterolemia, unspecified: Secondary | ICD-10-CM | POA: Diagnosis present

## 2011-06-25 DIAGNOSIS — D61818 Other pancytopenia: Secondary | ICD-10-CM | POA: Diagnosis present

## 2011-06-25 DIAGNOSIS — R072 Precordial pain: Secondary | ICD-10-CM | POA: Diagnosis not present

## 2011-06-25 DIAGNOSIS — D696 Thrombocytopenia, unspecified: Secondary | ICD-10-CM | POA: Diagnosis present

## 2011-06-25 DIAGNOSIS — C50312 Malignant neoplasm of lower-inner quadrant of left female breast: Secondary | ICD-10-CM | POA: Diagnosis present

## 2011-06-25 DIAGNOSIS — L0390 Cellulitis, unspecified: Secondary | ICD-10-CM | POA: Diagnosis present

## 2011-06-25 DIAGNOSIS — D709 Neutropenia, unspecified: Secondary | ICD-10-CM | POA: Diagnosis present

## 2011-06-25 DIAGNOSIS — F411 Generalized anxiety disorder: Secondary | ICD-10-CM | POA: Diagnosis present

## 2011-06-25 DIAGNOSIS — F3289 Other specified depressive episodes: Secondary | ICD-10-CM | POA: Diagnosis present

## 2011-06-25 DIAGNOSIS — T451X5A Adverse effect of antineoplastic and immunosuppressive drugs, initial encounter: Secondary | ICD-10-CM | POA: Diagnosis present

## 2011-06-25 DIAGNOSIS — R5081 Fever presenting with conditions classified elsewhere: Secondary | ICD-10-CM | POA: Diagnosis present

## 2011-06-25 DIAGNOSIS — C50319 Malignant neoplasm of lower-inner quadrant of unspecified female breast: Secondary | ICD-10-CM

## 2011-06-25 DIAGNOSIS — F329 Major depressive disorder, single episode, unspecified: Secondary | ICD-10-CM | POA: Diagnosis present

## 2011-06-25 DIAGNOSIS — I10 Essential (primary) hypertension: Secondary | ICD-10-CM | POA: Diagnosis present

## 2011-06-25 DIAGNOSIS — L02219 Cutaneous abscess of trunk, unspecified: Secondary | ICD-10-CM | POA: Diagnosis present

## 2011-06-25 DIAGNOSIS — IMO0002 Reserved for concepts with insufficient information to code with codable children: Secondary | ICD-10-CM | POA: Diagnosis present

## 2011-06-25 DIAGNOSIS — R509 Fever, unspecified: Secondary | ICD-10-CM

## 2011-06-25 DIAGNOSIS — E119 Type 2 diabetes mellitus without complications: Secondary | ICD-10-CM | POA: Diagnosis present

## 2011-06-25 LAB — URINE MICROSCOPIC-ADD ON

## 2011-06-25 LAB — CBC
Hemoglobin: 11 g/dL — ABNORMAL LOW (ref 12.0–15.0)
MCHC: 33.2 g/dL (ref 30.0–36.0)
Platelets: 56 10*3/uL — ABNORMAL LOW (ref 150–400)
RBC: 3.72 MIL/uL — ABNORMAL LOW (ref 3.87–5.11)

## 2011-06-25 LAB — GLUCOSE, CAPILLARY: Glucose-Capillary: 238 mg/dL — ABNORMAL HIGH (ref 70–99)

## 2011-06-25 LAB — DIFFERENTIAL
Basophils Relative: 0 % (ref 0–1)
Lymphs Abs: 0 10*3/uL — ABNORMAL LOW (ref 0.7–4.0)
Monocytes Relative: 0 % — ABNORMAL LOW (ref 3–12)
Neutro Abs: 0 10*3/uL — ABNORMAL LOW (ref 1.7–7.7)
Neutrophils Relative %: 0 % — ABNORMAL LOW (ref 43–77)

## 2011-06-25 LAB — COMPREHENSIVE METABOLIC PANEL
AST: 21 U/L (ref 0–37)
Albumin: 3.9 g/dL (ref 3.5–5.2)
Albumin: 3.2 g/dL — ABNORMAL LOW (ref 3.5–5.2)
BUN: 11 mg/dL (ref 6–23)
Calcium: 9.4 mg/dL (ref 8.4–10.5)
Chloride: 103 mEq/L (ref 96–112)
Chloride: 97 mEq/L (ref 96–112)
Creatinine, Ser: 0.51 mg/dL (ref 0.50–1.10)
Glucose, Bld: 237 mg/dL — ABNORMAL HIGH (ref 70–99)
Potassium: 4.1 mEq/L (ref 3.5–5.3)
Total Bilirubin: 0.4 mg/dL (ref 0.3–1.2)
Total Protein: 5.9 g/dL — ABNORMAL LOW (ref 6.0–8.3)

## 2011-06-25 LAB — URINALYSIS, ROUTINE W REFLEX MICROSCOPIC
Glucose, UA: NEGATIVE mg/dL
Hgb urine dipstick: NEGATIVE
Protein, ur: NEGATIVE mg/dL
Specific Gravity, Urine: 1.012 (ref 1.005–1.030)
pH: 7.5 (ref 5.0–8.0)

## 2011-06-25 LAB — CBC WITH DIFFERENTIAL/PLATELET
Basophils Absolute: 0 10*3/uL (ref 0.0–0.1)
Eosinophils Absolute: 0.1 10*3/uL (ref 0.0–0.5)
HCT: 36.4 % (ref 34.8–46.6)
HGB: 12.1 g/dL (ref 11.6–15.9)
MCH: 29.2 pg (ref 25.1–34.0)
MONO#: 0 10*3/uL — ABNORMAL LOW (ref 0.1–0.9)
NEUT%: 39.4 % (ref 38.4–76.8)
WBC: 1 10*3/uL — ABNORMAL LOW (ref 3.9–10.3)
lymph#: 0.5 10*3/uL — ABNORMAL LOW (ref 0.9–3.3)

## 2011-06-25 LAB — LACTIC ACID, PLASMA: Lactic Acid, Venous: 2.2 mmol/L (ref 0.5–2.2)

## 2011-06-25 MED ORDER — GUAIFENESIN-DM 100-10 MG/5ML PO SYRP: 5.0000 mL | ORAL_SOLUTION | ORAL | Status: DC | PRN

## 2011-06-25 MED ORDER — ONDANSETRON HCL 4 MG PO TABS: 4.0000 mg | ORAL_TABLET | Freq: Four times a day (QID) | ORAL | Status: DC | PRN

## 2011-06-25 MED ORDER — VANCOMYCIN HCL IN DEXTROSE 1-5 GM/200ML-% IV SOLN
1000.0000 mg | Freq: Two times a day (BID) | INTRAVENOUS | Status: DC
  Administered 2011-06-26 – 2011-06-27 (×3): 1000 mg via INTRAVENOUS
  Filled 2011-06-25 (×6): qty 200

## 2011-06-25 MED ORDER — ALBUTEROL SULFATE (5 MG/ML) 0.5% IN NEBU: 2.5000 mg | INHALATION_SOLUTION | RESPIRATORY_TRACT | Status: DC | PRN

## 2011-06-25 MED ORDER — INSULIN ASPART 100 UNIT/ML ~~LOC~~ SOLN
0.0000 [IU] | Freq: Three times a day (TID) | SUBCUTANEOUS | Status: DC
  Administered 2011-06-26: 3 [IU] via SUBCUTANEOUS
  Administered 2011-06-26: 1 [IU] via SUBCUTANEOUS
  Administered 2011-06-26: 5 [IU] via SUBCUTANEOUS
  Administered 2011-06-27: 2 [IU] via SUBCUTANEOUS
  Administered 2011-06-27: 3 [IU] via SUBCUTANEOUS
  Administered 2011-06-27 – 2011-06-29 (×3): 2 [IU] via SUBCUTANEOUS
  Administered 2011-06-29: 0 [IU] via SUBCUTANEOUS
  Administered 2011-06-29: 2 [IU] via SUBCUTANEOUS
  Administered 2011-06-30 (×3): 3 [IU] via SUBCUTANEOUS
  Administered 2011-07-02: 2 [IU] via SUBCUTANEOUS

## 2011-06-25 MED ORDER — ACETAMINOPHEN 325 MG PO TABS: 650.0000 mg | ORAL_TABLET | Freq: Four times a day (QID) | ORAL | Status: DC | PRN

## 2011-06-25 MED ORDER — ONDANSETRON HCL 4 MG/2ML IJ SOLN: 4.0000 mg | Freq: Four times a day (QID) | INTRAMUSCULAR | Status: DC | PRN

## 2011-06-25 MED ORDER — VANCOMYCIN HCL IN DEXTROSE 1-5 GM/200ML-% IV SOLN
1000.0000 mg | Freq: Once | INTRAVENOUS | Status: AC
  Administered 2011-06-25: 1000 mg via INTRAVENOUS
  Filled 2011-06-25: qty 200

## 2011-06-25 MED ORDER — ALUM & MAG HYDROXIDE-SIMETH 200-200-20 MG/5ML PO SUSP
30.0000 mL | Freq: Four times a day (QID) | ORAL | Status: DC | PRN
  Administered 2011-06-27: 30 mL via ORAL
  Filled 2011-06-25: qty 30

## 2011-06-25 MED ORDER — SERTRALINE HCL 50 MG PO TABS
50.0000 mg | ORAL_TABLET | Freq: Every day | ORAL | Status: DC
  Administered 2011-06-25 – 2011-07-02 (×7): 50 mg via ORAL
  Filled 2011-06-25 (×8): qty 1

## 2011-06-25 MED ORDER — INSULIN ASPART 100 UNIT/ML ~~LOC~~ SOLN: 0.0000 [IU] | Freq: Every day | SUBCUTANEOUS | Status: DC

## 2011-06-25 MED ORDER — HYDROCODONE-ACETAMINOPHEN 5-325 MG PO TABS
1.0000 | ORAL_TABLET | Freq: Four times a day (QID) | ORAL | Status: DC | PRN
  Administered 2011-06-26 (×3): 1 via ORAL
  Administered 2011-06-28: 0.5 via ORAL
  Administered 2011-07-02: 1 via ORAL
  Filled 2011-06-25 (×5): qty 1

## 2011-06-25 MED ORDER — ATORVASTATIN CALCIUM 40 MG PO TABS
40.0000 mg | ORAL_TABLET | Freq: Every day | ORAL | Status: DC
  Administered 2011-06-26 – 2011-07-02 (×6): 40 mg via ORAL
  Filled 2011-06-25 (×8): qty 1

## 2011-06-25 MED ORDER — CIPROFLOXACIN HCL 500 MG PO TABS: 500.0000 mg | ORAL_TABLET | Freq: Two times a day (BID) | ORAL | Status: DC

## 2011-06-25 MED ORDER — ACETAMINOPHEN 325 MG PO TABS
650.0000 mg | ORAL_TABLET | Freq: Once | ORAL | Status: AC
  Administered 2011-06-25: 650 mg via ORAL
  Filled 2011-06-25: qty 2

## 2011-06-25 MED ORDER — DOCUSATE SODIUM 100 MG PO CAPS
100.0000 mg | ORAL_CAPSULE | Freq: Two times a day (BID) | ORAL | Status: DC
  Administered 2011-06-26 – 2011-07-02 (×3): 100 mg via ORAL
  Filled 2011-06-25 (×14): qty 1

## 2011-06-25 MED ORDER — PROCHLORPERAZINE MALEATE 10 MG PO TABS
10.0000 mg | ORAL_TABLET | Freq: Four times a day (QID) | ORAL | Status: DC | PRN
  Filled 2011-06-25: qty 1

## 2011-06-25 MED ORDER — SODIUM CHLORIDE 0.9 % IV SOLN
500.0000 mg | Freq: Once | INTRAVENOUS | Status: AC
  Administered 2011-06-25: 500 mg via INTRAVENOUS
  Filled 2011-06-25: qty 500

## 2011-06-25 MED ORDER — LORAZEPAM 2 MG/ML IJ SOLN
0.5000 mg | Freq: Once | INTRAMUSCULAR | Status: AC
  Administered 2011-06-25: 0.5 mg via INTRAVENOUS
  Filled 2011-06-25: qty 1

## 2011-06-25 MED ORDER — ACYCLOVIR 200 MG PO CAPS
400.0000 mg | ORAL_CAPSULE | Freq: Four times a day (QID) | ORAL | Status: DC
  Administered 2011-06-26 – 2011-07-01 (×18): 400 mg via ORAL
  Filled 2011-06-25 (×31): qty 2

## 2011-06-25 MED ORDER — FILGRASTIM 300 MCG/ML IJ SOLN
300.0000 ug | Freq: Every day | INTRAMUSCULAR | Status: DC
  Administered 2011-06-25 – 2011-06-27 (×3): 300 ug via SUBCUTANEOUS
  Filled 2011-06-25 (×6): qty 1

## 2011-06-25 MED ORDER — SODIUM CHLORIDE 0.9 % IV SOLN
INTRAVENOUS | Status: DC
  Administered 2011-06-25 – 2011-06-27 (×4): via INTRAVENOUS
  Administered 2011-06-27: 1000 mL via INTRAVENOUS
  Administered 2011-06-28 – 2011-07-02 (×6): via INTRAVENOUS

## 2011-06-25 MED ORDER — ACETAMINOPHEN 650 MG RE SUPP: 650.0000 mg | Freq: Four times a day (QID) | RECTAL | Status: DC | PRN

## 2011-06-25 MED ORDER — PANTOPRAZOLE SODIUM 40 MG PO TBEC
40.0000 mg | DELAYED_RELEASE_TABLET | Freq: Every day | ORAL | Status: DC
  Administered 2011-06-25 – 2011-07-02 (×7): 40 mg via ORAL
  Filled 2011-06-25 (×8): qty 1

## 2011-06-25 MED ORDER — SODIUM CHLORIDE 0.9 % IV SOLN
INTRAVENOUS | Status: DC
Start: 2011-06-25 — End: 2011-06-25
  Administered 2011-06-25: 18:00:00 via INTRAVENOUS

## 2011-06-25 MED ORDER — INSULIN GLARGINE 100 UNIT/ML ~~LOC~~ SOLN
15.0000 [IU] | Freq: Every day | SUBCUTANEOUS | Status: DC
  Administered 2011-06-26 – 2011-07-01 (×6): 15 [IU] via SUBCUTANEOUS

## 2011-06-25 MED ORDER — SODIUM CHLORIDE 0.9 % IV SOLN
500.0000 mg | Freq: Four times a day (QID) | INTRAVENOUS | Status: DC
  Administered 2011-06-26 – 2011-06-27 (×7): 500 mg via INTRAVENOUS
  Filled 2011-06-25 (×10): qty 500

## 2011-06-25 MED ORDER — SODIUM CHLORIDE 0.9 % IJ SOLN
3.0000 mL | Freq: Two times a day (BID) | INTRAMUSCULAR | Status: DC
  Administered 2011-06-26 – 2011-07-01 (×5): 3 mL via INTRAVENOUS

## 2011-06-25 NOTE — ED Notes (Signed)
Pt from home. Reports fever starting today. Reports being treated for breast cancer, left mastectomy in February of this year. Last chemo treatment last Thursday. Pt denies any pain. Reports some sob. Denies N/V. Pt is A/O x4. Skin warm and dry. Respirations even and unlabored. NAD noted at this time.

## 2011-06-25 NOTE — ED Notes (Signed)
Pt denies any allergy to tylenol

## 2011-06-25 NOTE — ED Notes (Signed)
Pt aware of need of urine sample. Pt states that she is unable to obtain at this time. Will reassess

## 2011-06-25 NOTE — ED Notes (Signed)
Family at bedside. 

## 2011-06-25 NOTE — ED Provider Notes (Signed)
History     CSN: [id number]  Arrival date & time 06/25/11  1551   First MD Initiated Contact with Patient 06/25/11 1612      Chief Complaint  Patient presents with  . Fever    (Consider location/radiation/quality/duration/timing/severity/associated sxs/prior treatment) HPI The patient presents with concerns of fever.  She has stage III invasive lobular carcinoma of the breast for which she is receiving chemotherapy.  Her last chemotherapy session was 4 days ago.  She notes that since that treatment she was generally well until today.  Soon after awakening she developed fever.  The patient's temperature home at a maximum of 102.  She notes concurrent generalized sense of illness, without any vomiting, diarrhea, dysuria, significant cough.  She denies any disorientation or confusion.  She spoke with her physician, had blood work done, with results consistent with neutropenia.  She was prescribed ciprofloxacin, but had not initiated this therapy before presenting here for evaluation. Past Medical History  Diagnosis Date  . DIABETES MELLITUS 08/10/2007  . 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 HYPERCHOLESTEROLEMIA 12/17/2006  . OBESITY 06/04/2008  . ANXIETY 06/04/2008  . DEPRESSION 08/10/2007  . HYPERTENSION 12/17/2006  . ASTHMATIC BRONCHITIS, ACUTE 06/04/2008  . ALLERGIC RHINITIS 12/13/2007  . GERD 12/17/2006  . DEGENERATIVE JOINT DISEASE 12/17/2006  . Asthma   . Blood transfusion 1964    post childbirth  . 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 Laminectomy [id number]    s/p-Dr. Jule Ser  . Bladder tack 1974  . Cholecystectomy 02/23/1991    [facility] - Dr Jamey Ripa  . Tonsillectomy   . Rotator cuff repair 8/03    right, Dr. Shelle Iron  . Enterocele repair 06/08    Dr. Estanislado Pandy  . Rotator cuff repair 10/26/2008    left, Dr. Rayburn Ma  . Hip surgery 08/27/2010    DR. Despina Hick  . Breast excisional biopsy 11/10/1988    left benign Dr Jamey Ripa  . Breast  excisional biopsy 03/04/1983    Right - two areas - Dr Jamey Ripa  . Breast excisional biopsy 10/09/1980    right - Dr Jamey Ripa  . Breast excisional biopsy 10/18/1979    left - Dr Jamey Ripa  . Breast excisional biopsy 01/02/1994    right - Dr Jamey Ripa  . Vaginal hysterectomy 1974    partial   . Colonoscopy   . Mastectomy     left  . Modified radical mastectomy w/ axillary lymph node dissection     03-30-11 LT  . Portacath placement 04/13/2011    Procedure: INSERTION PORT-A-CATH;  Surgeon: Currie Paris, MD;  Location: [facility];  Service: General;  Laterality: N/A;  porta cath placement,removal of J-P drain    Family History  Problem Relation Age of Onset  . COPD Mother   . Arthritis Mother   . Emphysema Mother   . Mental illness Paternal Grandfather   . Heart disease Other     Sibling  . Diabetes Other     Sibling   . Diabetes Brother   . Cancer Brother     prostate  . Cancer Father     malignant brain tumor    History  Substance Use Topics  . Smoking status: Never Smoker   . Smokeless tobacco: Never Used  . Alcohol Use: No    OB History    Grav Para Term Preterm Abortions TAB SAB Ect Mult Living   5 3  3      Review of Systems  Constitutional:       HPI  HENT:       HPI otherwise negative  Eyes: Negative.   Respiratory:       HPI, otherwise negative  Cardiovascular:       HPI, otherwise nmegative  Gastrointestinal: Positive for nausea. Negative for vomiting.  Genitourinary:       HPI, otherwise negative  Musculoskeletal:       HPI, otherwise negative  Skin: Negative.   Neurological: Negative for syncope.    Allergies  Ceftriaxone sodium; Cephalexin; Clindamycin; Dilaudid; Oxycodone-acetaminophen; Rocephin; and Sulfonamide derivatives  Home Medications   Current Outpatient Rx  Name Route Sig Dispense Refill  . ACYCLOVIR 200 MG PO CAPS Oral Take 2 capsules (400 mg total) by mouth every 6 (six) hours. While awake. 60 capsule 3    . AMLODIPINE BESYLATE 10 MG PO TABS Oral Take 1 tablet (10 mg total) by mouth daily. 30 tablet 11  . ATORVASTATIN CALCIUM 40 MG PO TABS Oral Take 1 tablet (40 mg total) by mouth daily. 30 tablet 11  . BENAZEPRIL HCL 40 MG PO TABS Oral Take 1 tablet (40 mg total) by mouth daily. 30 tablet 11  . VITAMIN D 2000 UNITS PO TABS Oral Take 2,000 Units by mouth daily.      Marland Kitchen DEXAMETHASONE 4 MG PO TABS  Take 2 tablets by mouth once a day on the day after chemotherapy and then take 2 tablets two times a day for 2 days. Take with food. 30 tablet 1  . HYDROCODONE-ACETAMINOPHEN 5-325 MG PO TABS Oral Take 1 tablet by mouth every 6 (six) hours as needed. Pain.    Marland Kitchen LANTUS SOLOSTAR [place] Subcutaneous Inject 15 Units into the skin daily.    Marland Kitchen LIDOCAINE-PRILOCAINE 2.5-2.5 % EX CREA Topical Apply 1 application topically as needed. For pain    . METFORMIN HCL ER 500 MG PO TB24  TAKE ONE TABLET BY MOUTH TWICE DAILY 60 tablet 5  . OMEPRAZOLE 20 MG PO CPDR Oral Take 1 capsule (20 mg total) by mouth daily. 30 capsule 11  . ONDANSETRON HCL 8 MG PO TABS  Take 1 tablet two times a day as needed for nausea or vomiting starting on the third day after chemotherapy. 30 tablet 1  . PRESCRIPTION MEDICATION Intravenous Inject 208 mg into the vein See admin instructions. epirubicin (ELLENCE) chemo injection 208 mg 100 mg/m2  2.07 m2 (Treatment Plan Actual)  Once 06/18/2011   Route:  Intravenous    . PRESCRIPTION MEDICATION Intravenous Inject 1,050 mg into the vein See admin instructions. Fluorouracil (ADRUCIL) chemo injection 1,050 mg 500 mg/m2  2.07 m2 (Treatment Plan Actual)  Once 06/18/2011 Route: intravenous    . PRESCRIPTION MEDICATION Intravenous Inject 1,040 mg into the vein See admin instructions. cyclophosphamide (CYTOXAN) 1,040 mg in sodium chloride 0.9 % 250 mL chemo infusion  06/18/2011 Rout: Intravenous    . PROCHLORPERAZINE MALEATE 10 MG PO TABS Oral Take 1 tablet (10 mg total) by mouth every 6 (six) hours as needed  (Nausea or vomiting). 30 tablet 1  . SERTRALINE HCL 50 MG PO TABS Oral Take 1 tablet (50 mg total) by mouth daily. 30 tablet 5  . SITAGLIPTIN PHOSPHATE 50 MG PO TABS Oral Take 50 mg by mouth daily.    Marland Kitchen CIPROFLOXACIN HCL 500 MG PO TABS Oral Take 1 tablet (500 mg total) by mouth 2 (two) times daily. 14 tablet 0    BP  129/57  Temp(Src) 100.6 F (38.1 C) (Oral)  Resp 18  Ht 5\' 8"  (1.727 m)  Wt 182 lb (82.555 kg)  BMI 27.67 kg/m2  SpO2 95%  Physical Exam  Nursing note and vitals reviewed. Constitutional: She is oriented to person, place, and time. She appears well-developed and well-nourished. No distress.  HENT:  Mouth/Throat: Oropharynx is clear and moist.  Eyes: Conjunctivae are normal. No scleral icterus.  Neck: Neck supple. No tracheal deviation present.  Cardiovascular: Normal rate, regular rhythm, normal heart sounds and intact distal pulses.   Pulmonary/Chest: Effort normal and breath sounds normal. No respiratory distress.       portacath site right chest non tender, no erythema. Left breast surgical site, generally well-appearing  Abdominal: Soft. Normal appearance and bowel sounds are normal. She exhibits no distension. There is no tenderness.  Genitourinary:       No cva tenderness  Musculoskeletal: She exhibits no edema and no tenderness.  Neurological: She is alert and oriented to person, place, and time.  Skin: Skin is warm and dry. No rash noted. She is not diaphoretic.  Psychiatric: She has a normal mood and affect.    ED Course  Procedures (including critical care time)  Labs Reviewed  CBC - Abnormal; Notable for the following:    WBC 0.6 (*)    RBC 3.72 (*)    Hemoglobin 11.0 (*)    HCT 33.1 (*)    Platelets 56 (*)    All other components within normal limits  DIFFERENTIAL - Abnormal; Notable for the following:    Neutrophils Relative 0 (*)    Neutro Abs 0 (*)    Lymphocytes Relative 0 (*)    Lymphs Abs 0 (*)    Monocytes Relative 0 (*)    Monocytes  Absolute 0 (*)    All other components within normal limits  COMPREHENSIVE METABOLIC PANEL - Abnormal; Notable for the following:    Sodium 132 (*)    Glucose, Bld 203 (*)    Total Protein 5.9 (*)    Albumin 3.2 (*)    All other components within normal limits  LACTIC ACID, PLASMA  URINALYSIS, ROUTINE W REFLEX MICROSCOPIC  CULTURE, BLOOD (ROUTINE X 2)  CULTURE, BLOOD (ROUTINE X 2)   Dg Chest 1 View (portable)  06/25/2011  *RADIOLOGY REPORT*  Clinical Data: Fever, chemotherapy.  CHEST - 1 VIEW  Comparison: 05/22/2011  Findings: Right Port-A-Cath remains in place, unchanged.  No confluent airspace opacity or effusion.  Heart is normal size. Surgical clips in the left axilla.  IMPRESSION: No active cardiopulmonary disease.  Original Report Authenticated By: Cyndie Chime, M.D.     No diagnosis found.  Cardiac monitor 101 sinus tach abnormal Pulse oximetry 97% nasal cannula abnormal   MDM  This patient with invasive lobular ductal carcinoma now presents with new fever.  Given the patient's recent chemotherapy, there is concern for neutropenic fever.  The patient's labs are consistent with this entity.  Although there was no clear source, given the fever, her labs, she was started on antibiotics.  Given her allergies she received vancomycin and imipenem/Cilastine.  The patient was admitted for further evaluation and management.  Throughout her ED stay, she defervesced, noted that she felt better, and remained awake, alert, oriented throughout.  Gerhard Munch, MD 06/26/11 0010

## 2011-06-25 NOTE — Progress Notes (Signed)
ANTIBIOTIC CONSULT NOTE - INITIAL  Pharmacy Consult for Vancomycin and Primaxin Indication: Neutropenic fever  Allergies  Allergen Reactions  . Ceftriaxone Sodium     REACTION: hives  . Cephalexin     REACTION: hives  . Clindamycin     REACTION: hives  . Dilaudid (Hydromorphone Hcl) Itching  . Oxycodone-Acetaminophen     REACTION: hives  . Rocephin (Ceftriaxone Sodium In Dextrose) Hives  . Sulfonamide Derivatives     REACTION: hives   Patient Measurements: Height: 5\' 8"  (172.7 cm) Weight: 182 lb (82.555 kg) IBW/kg (Calculated) : 63.9   Vital Signs: Temp: 100.9 F (38.3 C) (05/23 1943) Temp src: Oral (05/23 1943) BP: 129/57 mmHg (05/23 1733) Pulse Rate: 105  (05/23 1943)  Labs:  Basename 06/25/11 1642 06/25/11 0920  WBC 0.6* 1.0*  HGB 11.0* 12.1  PLT 56* 58*  LABCREA -- --  CREATININE 0.51 0.64   Estimated Creatinine Clearance: 73.8 ml/min (by C-G formula based on Cr of 0.51). No results found for this basename: VANCOTROUGH:2,VANCOPEAK:2,VANCORANDOM:2,GENTTROUGH:2,GENTPEAK:2,GENTRANDOM:2,TOBRATROUGH:2,TOBRAPEAK:2,TOBRARND:2,AMIKACINPEAK:2,AMIKACINTROU:2,AMIKACIN:2, in the last 72 hours   Microbiology: No results found for this or any previous visit (from the past 720 hour(s)).  Medical History: Past Medical History  Diagnosis Date  . DIABETES MELLITUS 08/10/2007  . 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 HYPERCHOLESTEROLEMIA 12/17/2006  . OBESITY 06/04/2008  . ANXIETY 06/04/2008  . DEPRESSION 08/10/2007  . HYPERTENSION 12/17/2006  . ASTHMATIC BRONCHITIS, ACUTE 06/04/2008  . ALLERGIC RHINITIS 12/13/2007  . GERD 12/17/2006  . DEGENERATIVE JOINT DISEASE 12/17/2006  . Asthma   . Blood transfusion 1964    post childbirth  . 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  History of breast cancer, s/p left mastectomy (03/23/11), s/p FEC-100 chemo (04/17/11 -06/18/11), planning to start single agent taxol weekly x 12 weeks on 07/10/11.  Neulasta given 06/19/11; Neupogen started 06/25/11  First doses of Vanc and Primaxin given in ED 5/23 at ~1800  Renal function appears normal with CrCl ~74 ml/min  Goal of Therapy:  Vancomycin trough level 15-20 mcg/ml  Plan:   Primaxin 500mg  IV q6h  Vancomycin 1g IV q12h  Measure Vanc trough at steady state.  Follow up renal fxn and culture results.   Lynann Beaver PharmD, BCPS Pager [phone] 06/25/2011 8:44 PM

## 2011-06-25 NOTE — ED Notes (Signed)
IV team at bedside to access port  

## 2011-06-25 NOTE — Telephone Encounter (Signed)
Call transferred from operator, pt  States " I have a Temp of 102, what do I do?"  Pt advised she has not yet p/u her Cipro Rx from earlier office visit today wth Harriett Sine, NP  NP out of office, Reviewed with MD who gave VO for pt to proceed to ED for further evaluation.  Notified pt, MD has instructed her to go to ED with Fever of 102.  Pt advised she will have to call a ride to get her there. Asked pt if there was someone I could call for her or any way we could help.  Pt denied need for assistance as " I have someone, I just have to call them." Requested pt to call back if she has any concerns or needs assistance. Pt verbalized understanding.

## 2011-06-25 NOTE — ED Notes (Signed)
Pt given diet ginger ale per Dr. Jeraldine Loots.

## 2011-06-25 NOTE — Progress Notes (Signed)
OFFICE PROGRESS NOTE   CC:  Michele Mcalpine, MD,  Dr. Blossom Hoops Streck  Dr. Chipper Herb  Dr. Romero Belling   DIAGNOSIS: 1. Stage IIIA invasive lobular carcinoma, left breast.  2. Open wound, left breast surgical site.   PRIOR THERAPY:  1. Originally seen by Dr. Welton Flakes 03/05/2011 for invasive lobular carcinoma, left breast, ER/PR+, HER-2/neu negative . Stage IIB (T3 N0 M0).  2. Left breast mastectomy with axillary lymph node dissection 03/23/2011. Final pathology revealed a 6.0 cm invasive lobular carcinoma with lymphovascular invasion and perineural invasion. Invasive tumor was 2.9 cm from the nearest margin, lobular carcinoma in situ was noted. Initially 6 lymph nodes were positive for metastatic carcinoma. Tumor deposits were 0.5 0.8 0.7 0.7 0.3 and 1.0 cm focal extracapsular tumor extension was noted. Tumor was grade 3. The final lymph node count was 9 out of 16 positive for metastatic disease. Tumor was ER+100% PR+100% HER-2/neu -, ratio 1.31 proliferation marker 18% Final pathologic stage pT3a pN2a, Stage IIIA.  3. Began adjuvant chemotherapy with FEC 04/17/11, completed 06/18/11.    CURRENT THERAPY: Single agent taxol weekly x 12 weeks, to begin 07/09/11.   INTERVAL HISTORY: Has completed FEC-100, till begin weekly Taxol in 2 weeks. Feels well with no complaints. Is happy to complete FEC-100, has several questions about what to expect with Taxol. No headache or blurred vision. No cough or shortness of breath. No abdominal pain or new bone pain. Bowel and bladder function are normal. Appetite is good, with adequate fluid intake. Remainder of the 10 point review of systems is negative.   She asks about traveling to [place] late next week for a couple of days. Neutropenic precautions are reviewed with her. She also asks about a pedicure and I encourage her to wait a week.   She has follow-up with Dr. Jamey Ripa 07/03/11.   PHYSICAL EXAMINATION:  General: Well developed, well nourished, in no  acute distress, alone at today's visit.  EENT: No ocular or oral lesions. No stomatitis.  Respiratory: Lungs are clear to auscultation bilaterally with normal respiratory movement and no accessory muscle use.  Cardiac: No murmur, rub or tachycardia. Rhythm is regular. No upper or lower extremity edema.  GI: Abdomen is soft, no palpable hepatosplenomegaly. No fluid wave. No tenderness.  Musculoskeletal: No kyphosis, no tenderness over the spine, ribs or hips.  Lymph: No cervical, infraclavicular, axillary or inguinal adenopathy.  Neuro: No focal neurological deficits.  Psych: Alert and oriented X 3 appropriate mood and affect.  Skin: Left mastectomy incision is open to air. No redness on the upper or lower flap. Area of separation is 2 mm wide, 1.5 cm long, moist. No exudate. Granulation evident. No odor.   ECOG PERFORMANCE STATUS: 1 - Symptomatic but completely ambulatory   LABORATORY DATA:  Lab Results  Component Value Date   WBC 1.0*  ANC 0.4 06/25/2011   HGB 12.1 06/25/2011   HCT 36.4 06/25/2011   MCV 87.9 06/25/2011   PLT 58* 06/25/2011   ASSESSMENT: 71 year old female with:  1. Invasive lobular carcinoma of the left breast, completed FEC-100 with delays for toxicity and mastectomy wound infection with delayed healing.  2. Neutropenia, had Neulasta 06/19/11.   PLAN:  1. No chemo today, will start Taxol in 2 weeks. (07/10/11). She prefers a Friday treatment day. Schedule is pending new treatment plan by Dr. Welton Flakes.  2. Prescription for Cipro 500 mg po bid x 7 days.   All questions were answered. The patient knows to call the clinic  with any problems, questions or concerns.

## 2011-06-25 NOTE — H&P (Signed)
PCP:   Michele Mcalpine, MD, MD  Oncologist: Welton Flakes  Chief Complaint:   fever  HPI: [name] is a 71 y.o. 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   Presented with  She had her chemo 1 week ago on Thursday 5/16 and then had Neulasta next day she was seen in the office by her oncologist today and was feeling well allthough it was noted that her white blood cell count had come down. [place] on in the day she developed fever. Came to ER with fever up to 103.2. No cough, no dysuria, she has had increased urine output. NO easy bleeding or bruising. NO falls. Mastectomy site does not look infected.   She was given a prescription for ciprofloxacin by Theotis Barrio, FNP with Dr. Welton Flakes  office but never took it because his symptoms have progressed very rapidly she doesn't been in emergency department.   Review of Systems:    Pertinent positives include: Fevers, chills  Constitutional:  No weight loss, night sweats, fatigue, weight loss  HEENT:  No headaches, Difficulty swallowing,Tooth/dental problems,Sore throat,  No sneezing, itching, ear ache, nasal congestion, post nasal drip,  Cardio-vascular:  No chest pain, Orthopnea, PND, anasarca, dizziness, palpitations.no Bilateral lower extremity swelling  GI:  No heartburn, indigestion, abdominal pain, nausea, vomiting, diarrhea, change in bowel habits, loss of appetite, melena, blood in stool, hematemesis Resp:  no shortness of breath at rest. No dyspnea on exertion, No excess mucus, no productive cough, No non-productive cough, No coughing up of  blood.No change in color of mucus.No wheezing. Skin:  no rash or lesions. No jaundice GU:  no dysuria, change in color of urine, no urgency or frequency. No straining to urinate.  No flank pain.  Musculoskeletal:  No joint pain or no joint swelling. No decreased range of motion. No back pain.  Psych:  No change in mood or affect. No depression or anxiety. No memory loss.  Neuro: no localizing neurological complaints, no tingling, no weakness, no double vision, no gait abnormality, no slurred speech, no confusion  Otherwise ROS are negative except for above, 10 systems were reviewed  Past Medical History: Past Medical History  Diagnosis Date  . DIABETES MELLITUS 08/10/2007  . 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 HYPERCHOLESTEROLEMIA 12/17/2006  . OBESITY 06/04/2008  . ANXIETY 06/04/2008  . DEPRESSION 08/10/2007  . HYPERTENSION 12/17/2006  . ASTHMATIC BRONCHITIS, ACUTE 06/04/2008  . ALLERGIC RHINITIS 12/13/2007  . GERD 12/17/2006  . DEGENERATIVE JOINT DISEASE 12/17/2006  . Asthma   . Blood transfusion 1964    post childbirth  . Breast cancer, IXC, Right, receptor +, her 2 neg 02/20/2011    left   Past Surgical History  Procedure Date  . Laminectomy [id number]    s/p-Dr. Jule Ser  . Bladder tack 1974  . Cholecystectomy 02/23/1991    [facility] - Dr Jamey Ripa  . Tonsillectomy   . Rotator cuff repair 8/03    right, Dr. Shelle Iron  . Enterocele repair 06/08    Dr. Estanislado Pandy  . Rotator cuff repair 10/26/2008    left, Dr. Rayburn Ma  . Hip surgery 08/27/2010    DR. Despina Hick  . Breast excisional biopsy 11/10/1988    left benign Dr Jamey Ripa  . Breast excisional biopsy  03/04/1983    Right - two areas - Dr Jamey Ripa  . Breast excisional biopsy 10/09/1980    right - Dr Jamey Ripa  . Breast excisional biopsy 10/18/1979    left - Dr Jamey Ripa  . Breast excisional biopsy 01/02/1994    right - Dr Jamey Ripa  . Vaginal hysterectomy 1974    partial   . Colonoscopy   . Modified radical mastectomy w/ axillary lymph node dissection     03-30-11 LT  .  Portacath placement 04/13/2011    Procedure: INSERTION PORT-A-CATH;  Surgeon: Currie Paris, MD;  Location: [facility];  Service: General;  Laterality: N/A;  porta cath placement,removal of J-P drain  . Mastectomy 03/23/2011    left     Medications: Prior to Admission medications   Medication Sig Start Date End Date Taking? Authorizing Provider  acyclovir (ZOVIRAX) 200 MG capsule Take 2 capsules (400 mg total) by mouth every 6 (six) hours. While awake. 05/28/11  Yes Christina P Rama, MD  amLODipine (NORVASC) 10 MG tablet Take 1 tablet (10 mg total) by mouth daily. 01/14/11  Yes Michele Mcalpine, MD  atorvastatin (LIPITOR) 40 MG tablet Take 1 tablet (40 mg total) by mouth daily. 02/12/11 02/12/12 Yes Michele Mcalpine, MD  benazepril (LOTENSIN) 40 MG tablet Take 1 tablet (40 mg total) by mouth daily. 01/14/11  Yes Michele Mcalpine, MD  Cholecalciferol (VITAMIN D) 2000 UNITS tablet Take 2,000 Units by mouth daily.     Yes Historical Provider, MD  dexamethasone (DECADRON) 4 MG tablet Take 2 tablets by mouth once a day on the day after chemotherapy and then take 2 tablets two times a day for 2 days. Take with food. 03/30/11 03/29/12 Yes Victorino December, MD  HYDROcodone-acetaminophen (NORCO) 5-325 MG per tablet Take 1 tablet by mouth every 6 (six) hours as needed. Pain. 04/06/11  Yes Currie Paris, MD  Insulin Glargine (LANTUS SOLOSTAR [place]) Inject 15 Units into the skin daily.   Yes Historical Provider, MD  lidocaine-prilocaine (EMLA) cream Apply 1 application topically as needed. For pain 03/30/11 03/29/12 Yes Victorino December, MD  metFORMIN (GLUCOPHAGE-XR) 500 MG 24 hr tablet TAKE ONE TABLET BY MOUTH TWICE DAILY 05/14/11  Yes Michele Mcalpine, MD  omeprazole (PRILOSEC) 20 MG capsule Take 1 capsule (20 mg total) by mouth daily. 01/14/11  Yes Michele Mcalpine, MD  ondansetron (ZOFRAN) 8 MG tablet Take 1 tablet two times a day as needed for nausea or vomiting starting on the third day after chemotherapy.  03/30/11 03/29/12 Yes Victorino December, MD  PRESCRIPTION MEDICATION Inject 208 mg into the vein See admin instructions. epirubicin (ELLENCE) chemo injection 208 mg 100 mg/m2  2.07 m2 (Treatment Plan Actual)  Once 06/18/2011   Route:  Intravenous   Yes Historical Provider, MD  PRESCRIPTION MEDICATION Inject 1,050 mg into the vein See admin instructions. Fluorouracil (ADRUCIL) chemo injection 1,050 mg 500 mg/m2  2.07 m2 (Treatment Plan Actual)  Once 06/18/2011 Route: intravenous   Yes Historical Provider, MD  PRESCRIPTION MEDICATION Inject 1,040 mg into the vein See admin instructions. cyclophosphamide (CYTOXAN) 1,040 mg in sodium chloride 0.9 % 250 mL chemo infusion  06/18/2011 Rout: Intravenous   Yes Historical Provider, MD  prochlorperazine (COMPAZINE) 10 MG tablet Take 1 tablet (10 mg total) by mouth every 6 (six) hours as needed (Nausea or vomiting). 03/30/11 03/29/12 Yes Victorino December, MD  sertraline (ZOLOFT) 50 MG tablet Take 1 tablet (50 mg total) by mouth daily. 04/07/11  Yes Michele Mcalpine, MD  sitaGLIPtin (JANUVIA) 50 MG tablet Take 50 mg by mouth daily.   Yes Historical Provider, MD    Allergies:   Allergies  Allergen Reactions  . Ceftriaxone Sodium     REACTION: hives  . Cephalexin     REACTION: hives  . Clindamycin     REACTION: hives  . Dilaudid (Hydromorphone Hcl) Itching  . Oxycodone-Acetaminophen     REACTION: hives  . Rocephin (Ceftriaxone Sodium In Dextrose) Hives  . Sulfonamide Derivatives     REACTION: hives    Social History:  Ambulatory  independently  Lives at  Home with husband   reports that she has never smoked. She has never used smokeless tobacco. She reports that she does not drink alcohol or use illicit drugs.   Family History: family history includes Arthritis in her mother; COPD in her mother; Cancer in her brother and father; Diabetes in her brother and other; Emphysema in her mother; Heart disease in her other; and Mental illness in her paternal  grandfather.    Physical Exam: Patient Vitals for the past 24 hrs:  BP Temp Temp src Resp SpO2 Height Weight  06/25/11 1733 129/57 mmHg 100.6 F (38.1 C) Oral 18  95 % - -  06/25/11 1603 142/60 mmHg 103.2 F (39.6 C) Oral 19  95 % 5\' 8"  (1.727 m) 82.555 kg (182 lb)    1. General:  in No Acute distress 2. Psychological: Alert and  Oriented 3. Head/ENT:    Dry Mucous Membranes                          Head Non traumatic, neck supple                          Normal  Dentition 4. SKIN: normal Skin turgor,  Skin clean Dry and intact no rash appears flushed 5. Heart: Regular rate and rhythm no Murmur, rapid Rub or gallop 6. Lungs: Clear to auscultation bilaterally, no wheezes or crackles   7. Abdomen: Soft, non-tender, Non distended 8. Lower extremities: no clubbing, cyanosis, or edema 9. Neurologically Grossly intact, moving all 4 extremities equally 10. MSK: Normal range of motion  body mass index is 27.67 kg/(m^2).   Labs on Admission:   [facility] 06/25/11 1642 06/25/11 0920  NA 132* 141  K 3.7 4.1  CL 97 103  CO2 25 29  GLUCOSE 203* 237*  BUN 11 11  CREATININE 0.51 0.64  CALCIUM 8.7 9.4  MG -- --  PHOS -- --    Basename 06/25/11 1642 06/25/11 0920  AST 21 17  ALT 21 20  ALKPHOS 99 96  BILITOT 0.4 0.8  PROT 5.9* 5.8*  ALBUMIN 3.2* 3.9   No results found for this basename: LIPASE:2,AMYLASE:2 in the last 72 hours  Basename 06/25/11 1642 06/25/11 0920  WBC 0.6* 1.0*  NEUTROABS 0* 0.4*  HGB 11.0* 12.1  HCT 33.1* 36.4  MCV 89.0 87.9  PLT 56* 58*   No results found for this basename: CKTOTAL:3,CKMB:3,CKMBINDEX:3,TROPONINI:3 in the last 72 hours No results found for this basename: TSH,T4TOTAL,FREET3,T3FREE,THYROIDAB in the last 72 hours No results found for this basename: VITAMINB12:2,FOLATE:2,FERRITIN:2,TIBC:2,IRON:2,RETICCTPCT:2 in the last 72 hours Lab Results  Component Value Date   HGBA1C 8.3* 02/06/2011    Estimated Creatinine Clearance: 73.8 ml/min (by  C-G formula based on Cr of 0.51). ABG No results found for this basename: phart, pco2, po2,  hco3, tco2, acidbasedef, o2sat     No results found for this basename: DDIMER    UA pending   Cultures:    Component Value Date/Time   SDES URINE, RANDOM 05/22/2011 2102   SPECREQUEST NONE 05/22/2011 2102   CULT NO GROWTH 05/22/2011 2102   REPTSTATUS 05/24/2011 FINAL 05/22/2011 2102       Radiological Exams on Admission: Dg Chest 1 View (portable)  06/25/2011  *RADIOLOGY REPORT*  Clinical Data: Fever, chemotherapy.  CHEST - 1 VIEW  Comparison: 05/22/2011  Findings: Right Port-A-Cath remains in place, unchanged.  No confluent airspace opacity or effusion.  Heart is normal size. Surgical clips in the left axilla.  IMPRESSION: No active cardiopulmonary disease.  Original Report Authenticated By: Cyndie Chime, M.D.    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 Will watch patient and step down overnight.  Marland KitchenDIABETES MELLITUS - hold by mouth medications and will put on sliding scale continue Lantus at 15 units, hyperglycemia in the setting of infection .Pancytopenia - likely secondary to chemotherapy as per oncology patient already had her Neulasta shot.  .Breast cancer, ILC, left, receptor +, her 2 neg - as per oncology, called Dr Gaylyn Rong for consult who reccommended addition of Neupogen [place] qday .Thrombocytopenia -  Likely chemotherapy induced no indication for transfusion for now .Hyponatremia - will give gentle IVF in  the setting of fever and hyperglycemia   Prophylaxis: SCD  Protonix  CODE STATUS: FULL CODE as per family and patient wishes at this time  I have spent a total of  65 min on this admission time taken discussion care with family and consultant  Hasina Kreager 06/25/2011, 6:53 PM

## 2011-06-25 NOTE — ED Notes (Signed)
Pt reports fever starting at 11:00 this morning with weakness and sob. Denies any pain N/V.

## 2011-06-25 NOTE — ED Notes (Signed)
Attempted to call report, nurse unavailable.

## 2011-06-25 NOTE — ED Notes (Signed)
Fever started today. Is cancer pt

## 2011-06-26 DIAGNOSIS — E1165 Type 2 diabetes mellitus with hyperglycemia: Secondary | ICD-10-CM

## 2011-06-26 DIAGNOSIS — C50919 Malignant neoplasm of unspecified site of unspecified female breast: Secondary | ICD-10-CM

## 2011-06-26 DIAGNOSIS — R509 Fever, unspecified: Secondary | ICD-10-CM

## 2011-06-26 DIAGNOSIS — E118 Type 2 diabetes mellitus with unspecified complications: Secondary | ICD-10-CM

## 2011-06-26 LAB — DIFFERENTIAL
Band Neutrophils: 0 % (ref 0–10)
Basophils Relative: 0 % (ref 0–1)
Eosinophils Relative: 0 % (ref 0–5)
Lymphocytes Relative: 0 % — ABNORMAL LOW (ref 12–46)
Lymphs Abs: 0 10*3/uL — ABNORMAL LOW (ref 0.7–4.0)
Neutrophils Relative %: 0 % — ABNORMAL LOW (ref 43–77)

## 2011-06-26 LAB — COMPREHENSIVE METABOLIC PANEL
ALT: 17 U/L (ref 0–35)
BUN: 8 mg/dL (ref 6–23)
CO2: 26 mEq/L (ref 19–32)
Calcium: 8.8 mg/dL (ref 8.4–10.5)
Creatinine, Ser: 0.54 mg/dL (ref 0.50–1.10)
GFR calc Af Amer: >90 mL/min (ref >90)
GFR calc non Af Amer: >90 mL/min (ref >90)
Glucose, Bld: 218 mg/dL — ABNORMAL HIGH (ref 70–99)
Total Protein: 5.7 g/dL — ABNORMAL LOW (ref 6.0–8.3)

## 2011-06-26 LAB — GLUCOSE, CAPILLARY

## 2011-06-26 LAB — URINE CULTURE
Culture  Setup Time: 201305240148
Culture: NO GROWTH

## 2011-06-26 LAB — MRSA PCR SCREENING: MRSA by PCR: NEGATIVE

## 2011-06-26 LAB — CBC
HCT: 31.1 % — ABNORMAL LOW (ref 36.0–46.0)
Hemoglobin: 10.2 g/dL — ABNORMAL LOW (ref 12.0–15.0)
MCHC: 32.8 g/dL (ref 30.0–36.0)
RBC: 3.49 MIL/uL — ABNORMAL LOW (ref 3.87–5.11)
WBC: 0.8 10*3/uL — CL (ref 4.0–10.5)

## 2011-06-26 MED ORDER — BOOST PLUS PO LIQD
237.0000 mL | Freq: Every day | ORAL | Status: DC
  Administered 2011-06-28 – 2011-07-01 (×2): 237 mL via ORAL
  Filled 2011-06-26 (×7): qty 237

## 2011-06-26 MED ORDER — MAGIC MOUTHWASH W/LIDOCAINE
5.0000 mL | Freq: Four times a day (QID) | ORAL | Status: DC | PRN
  Administered 2011-06-26: 5 mL via ORAL
  Filled 2011-06-26: qty 5

## 2011-06-26 NOTE — Progress Notes (Signed)
PROGRESS NOTE  [name] WUJ:[id number] DOB: 22-Jun-1940 DOA: 06/25/2011 PCP: Hailey Mcalpine, MD, MD Oncologist: Dr. Drue [name]  Brief narrative: [name] is a 71 year old female with a PMH of stage IIIA invasive lobular carcinoma of the breast s/p left mastectomy with axillary lymph node dissection on 03/23/11 complicated by an open wound post-operatively who was admitted on 06/25/11 with fever to 103.2 and neutropenia.  Assessment/Plan: Principal Problem:  *Neutropenic fever  Admitted and placed on broad spectrum antibiotics consisting of Vancomycin and Imipenem.  CXR negative for PNA, U/A not consistent with UTI.  H/O open breast wound, now well healed with no open areas.  Neupogen ordered. Active Problems:  DIABETES MELLITUS, uncontrolled  Hemoglobin A1c 8.6%, CBGs 200s.  Glucophage and Januvia on hold.  Currently on Lantus 15 units Q HS and moderate scale SSI.  Breast cancer, ILC, left, receptor +, her 2 neg  Completed adjuvant chemotherapy with FEC 06/18/11.  Single agent taxol planned weekly x 12 weeks to begin 07/09/11.  Pancytopenia / thrombocytopenia  Secondary to chemotherapy.  On neupogen for neutropenia.  Other counts stable, monitor.  Hyponatremia  Resolved with IVF.  Hypertension  Norvasc and Lotensin on hold.  Hypercholesterolemia  Continue Lipitor.  Anxiety / depression  Continue Zoloft.  GERD  Continue PPI therapy.   Code Status: Full Family Communication: None at bedside. Disposition Plan: Home when stable.  Medical Consultants:  Dr. Drue [name], Oncology  Other consultants:  None  Antibiotics:  Vancomycin 06/25/11--->  Primaxin 06/25/11--->  Acyclovir 06/25/11--->   Subjective  [name] denies any localizing symptoms such as dyspnea, cough, nausea, vomiting, diarrhea, or skin wounds.   Objective    Interim History: Stable overnight.   Objective: Filed Vitals:   06/25/11 1943 06/25/11 2315 06/26/11  0330 06/26/11 0400  BP:  130/52 128/61   Pulse: 105 104 97   Temp: 100.9 F (38.3 C) 100.6 F (38.1 C)  99.3 F (37.4 C)  TempSrc: Oral Oral  Oral  Resp: 20 28 20    Height:  5\' 8"  (1.727 m)    Weight:  82.8 kg (182 lb 8.7 oz)    SpO2: 95% 98% 98%     Intake/Output Summary (Last 24 hours) at 06/26/11 0703 Last data filed at 06/26/11 0528  Gross per 24 hour  Intake    800 ml  Output    900 ml  Net   -100 ml    Exam: Gen:  NAD Oropharynx: No lesions or thrush. Cardiovascular:  RRR, No M/R/G Chest/skin: Port-A-Cath site without local signs of infection. Left breast wound well healed. Respiratory: Lungs CTAB Gastrointestinal: Abdomen soft, NT/ND with normal active bowel sounds. Extremities: No C/E/C    Data Reviewed: Basic Metabolic Panel:  Lab 06/26/11 9562 06/25/11 1642 06/25/11 0920  NA 137 132* 141  K 3.7 3.7 --  CL 101 97 103  CO2 26 25 29   GLUCOSE 218* 203* 237*  BUN 8 11 11   CREATININE 0.54 0.51 0.64  CALCIUM 8.8 8.7 9.4  MG 1.6 -- --  PHOS 2.8 -- --   GFR Estimated Creatinine Clearance: 73.9 ml/min (by C-G formula based on Cr of 0.54). Liver Function Tests:  Lab 06/26/11 0510 06/25/11 1642 06/25/11 0920  AST 12 21 17   ALT 17 21 20   ALKPHOS 85 99 96  BILITOT 0.6 0.4 0.8  PROT 5.7* 5.9* 5.8*  ALBUMIN 2.9* 3.2* 3.9   CBC:  Lab 06/26/11 0510 06/25/11 1642 06/25/11 0920  WBC 0.8* 0.6* 1.0*  NEUTROABS -- 0* 0.4*  HGB 10.2* 11.0* 12.1  HCT 31.1* 33.1* 36.4  MCV 89.1 89.0 87.9  PLT 46* 56* 58*   CBG:  Lab 06/25/11 2302  GLUCAP 238*   Hgb A1c  Basename 06/25/11 2044  HGBA1C 8.6*   Microbiology Recent Results (from the past 240 hour(s))  MRSA PCR SCREENING     Status: Normal   Collection Time   06/25/11 11:41 PM      Component Value Range Status Comment   MRSA by PCR NEGATIVE  NEGATIVE  Final     Procedures and Diagnostic Studies:  Dg Chest 1 View (portable) 06/25/2011 IMPRESSION: No active cardiopulmonary disease.  Original Report  Authenticated By: Cyndie Chime, M.D.    Scheduled Meds:   . acetaminophen  650 mg Oral Once  . acyclovir  400 mg Oral Q6H  . atorvastatin  40 mg Oral Daily  . docusate sodium  100 mg Oral BID  . filgrastim (NEUPOGEN)  SQ  300 mcg Subcutaneous q1800  . imipenem-cilastatin  500 mg Intravenous Once  . imipenem-cilastatin  500 mg Intravenous Q6H  . insulin aspart  0-15 Units Subcutaneous TID WC  . insulin aspart  0-5 Units Subcutaneous QHS  . insulin glargine  15 Units Subcutaneous QHS  . LORazepam  0.5 mg Intravenous Once  . pantoprazole  40 mg Oral Q1200  . sertraline  50 mg Oral Daily  . sodium chloride  3 mL Intravenous Q12H  . vancomycin  1,000 mg Intravenous Once  . vancomycin  1,000 mg Intravenous Q12H   Continuous Infusions:   . sodium chloride 100 mL/hr at 06/26/11 0606  . DISCONTD: sodium chloride 20 mL/hr at 06/25/11 1730      LOS: 1 day   Hailey Aldo, MD Pager [phone]  06/26/2011, 7:03 AM

## 2011-06-26 NOTE — Progress Notes (Signed)
CARE MANAGEMENT NOTE 06/26/2011  Patient:  NAISHA, WISDOM   Account Number:  [id number]  Date Initiated:  06/26/2011  Documentation initiated by:  Jaidee Stipe  Subjective/Objective Assessment:   pt with new found ca and new start of chemo temp of 103 at home and arrival, admitted into sdu wiht poss sepsis     Action/Plan:   lives at home   Anticipated DC Date:  06/27/2011   Anticipated DC Plan:  HOME/SELF CARE  In-house referral  NA      DC Planning Services  NA      [facility] Choice  NA   Choice offered to / List presented to:  NA   DME arranged  NA      DME agency  NA     HH arranged  NA      HH agency  NA   Status of service:  In process, will continue to follow Medicare Important Message given?  YES (If response is "NO", the following Medicare IM given date fields will be blank) Date Medicare IM given:  06/25/2011 Date Additional Medicare IM given:    Discharge Disposition:    Per UR Regulation:  Reviewed for med. necessity/level of care/duration of stay  If discussed at Long Length of Stay Meetings, dates discussed:    Comments:  [id number]/Merlin Golden Earlene Plater, RN, BSN, CCM No discharge needs present at time of this review at the sdu/icu level. Case Management [phone]

## 2011-06-26 NOTE — Progress Notes (Signed)
INITIAL ADULT NUTRITION ASSESSMENT Date: 06/26/2011   Time: 10:41 AM Reason for Assessment: Nutrition risk for dysphagia and unintentional weight loss   ASSESSMENT: Female 71 y.o.  Dx: Neutropenic fever  Hx:  Past Medical History  Diagnosis Date  . DIABETES MELLITUS 08/10/2007  . 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 HYPERCHOLESTEROLEMIA 12/17/2006  . OBESITY 06/04/2008  . ANXIETY 06/04/2008  . DEPRESSION 08/10/2007  . HYPERTENSION 12/17/2006  . ASTHMATIC BRONCHITIS, ACUTE 06/04/2008  . ALLERGIC RHINITIS 12/13/2007  . GERD 12/17/2006  . DEGENERATIVE JOINT DISEASE 12/17/2006  . Asthma   . Blood transfusion 1964    post childbirth  . Breast cancer, IXC, Right, receptor +, her 2 neg 02/20/2011    left    Related Meds:  Scheduled Meds:   . acetaminophen  650 mg Oral Once  . acyclovir  400 mg Oral Q6H  . atorvastatin  40 mg Oral Daily  . docusate sodium  100 mg Oral BID  . filgrastim (NEUPOGEN)  SQ  300 mcg Subcutaneous q1800  . imipenem-cilastatin  500 mg Intravenous Once  . imipenem-cilastatin  500 mg Intravenous Q6H  . insulin aspart  0-15 Units Subcutaneous TID WC  . insulin aspart  0-5 Units Subcutaneous QHS  . insulin glargine  15 Units Subcutaneous QHS  . LORazepam  0.5 mg Intravenous Once  . pantoprazole  40 mg Oral Q1200  . sertraline  50 mg Oral Daily  . sodium chloride  3 mL Intravenous Q12H  . vancomycin  1,000 mg Intravenous Once  . vancomycin  1,000 mg Intravenous Q12H   Continuous Infusions:   . sodium chloride 100 mL/hr at 06/26/11 0606  . DISCONTD: sodium chloride 20 mL/hr at 06/25/11 1730   PRN Meds:.acetaminophen, acetaminophen, albuterol, alum & mag hydroxide-simeth, guaiFENesin-dextromethorphan, HYDROcodone-acetaminophen, ondansetron (ZOFRAN) IV, ondansetron, prochlorperazine   Ht: 5\' 8"  (172.7 cm)  Wt: 182 lb 8.7 oz (82.8 kg)  Ideal Wt: 63.63 kg % Ideal Wt: 130%  Wt Readings from Last 10 Encounters:  06/25/11 182 lb 8.7 oz (82.8 kg)  06/25/11 182 lb 14.4 oz (82.963  kg)  06/18/11 186 lb 4.8 oz (84.505 kg)  06/16/11 186 lb 12.8 oz (84.732 kg)  06/05/11 184 lb 8 oz (83.689 kg)  06/02/11 182 lb (82.555 kg)  05/29/11 186 lb 1.6 oz (84.414 kg)  05/23/11 184 lb 11.2 oz (83.779 kg)  05/22/11 185 lb 1.6 oz (83.961 kg)  05/15/11 181 lb (82.101 kg)   Usual Wt: 186 lb. Patient reported she lost 4 lb this week.  % Usual Wt: 97.8%  Body mass index is 27.76 kg/(m^2). (Overweight)  Food/Nutrition Related Hx: Patient reports poor appetite and intake. She reported to be eating a little bit at meals. Patient denies any chewing and swallowing difficulty. Patient reported to drink Boost nutrition supplement at home. Patient voiced snack preference.   Labs:  CMP     Component Value Date/Time   NA 137 06/26/2011 0510   K 3.7 06/26/2011 0510   CL 101 06/26/2011 0510   CO2 26 06/26/2011 0510   GLUCOSE 218* 06/26/2011 0510   BUN 8 06/26/2011 0510   CREATININE 0.54 06/26/2011 0510   CALCIUM 8.8 06/26/2011 0510   PROT 5.7* 06/26/2011 0510   ALBUMIN 2.9* 06/26/2011 0510   AST 12 06/26/2011 0510   ALT 17 06/26/2011 0510   ALKPHOS 85 06/26/2011 0510   BILITOT 0.6 06/26/2011 0510   GFRNONAA >90 06/26/2011 0510   GFRAA >90 06/26/2011 0510    Intake/Output Summary (Last 24 hours) at  06/26/11 1045 Last data filed at 06/26/11 0900  Gross per 24 hour  Intake    800 ml  Output   1700 ml  Net   -900 ml     Diet Order: Carb Control  Supplements/Tube Feeding: none at this time  IVF:    sodium chloride Last Rate: 100 mL/hr at 06/26/11 1610  DISCONTD: sodium chloride Last Rate: 20 mL/hr at 06/25/11 1730    Estimated Nutritional Needs:   Kcal: 9604-5409 Protein: 99.2-124 grams Fluid: 1 ml per kcal intake  NUTRITION DIAGNOSIS: -Inadequate oral intake (NI-2.1).  Status: Ongoing  RELATED TO: poor appetite  AS EVIDENCE BY: patient reports poor appetite and to eat small amounts at meal times.   MONITORING/EVALUATION(Goals): PO intake, weight trends, labs 1. PO intake >  75% at meals and supplements.  2. Promote weight maintenance.   EDUCATION NEEDS: -No education needs identified at this time  INTERVENTION: 1. Will order patient high protein snack once daily.  2. Will order patient Boost Nutrition supplement once daily, provides an additional 360 kcal and 14 grams of protein daily.  3. RD to follow for nutrition plan of care.   Dietitian [phone]  DOCUMENTATION CODES Per approved criteria  -Not Applicable    Iven Finn [facility] 06/26/2011, 10:41 AM

## 2011-06-27 DIAGNOSIS — E118 Type 2 diabetes mellitus with unspecified complications: Secondary | ICD-10-CM

## 2011-06-27 DIAGNOSIS — R509 Fever, unspecified: Secondary | ICD-10-CM

## 2011-06-27 DIAGNOSIS — E1165 Type 2 diabetes mellitus with hyperglycemia: Secondary | ICD-10-CM

## 2011-06-27 DIAGNOSIS — IMO0002 Reserved for concepts with insufficient information to code with codable children: Secondary | ICD-10-CM

## 2011-06-27 DIAGNOSIS — C50319 Malignant neoplasm of lower-inner quadrant of unspecified female breast: Secondary | ICD-10-CM

## 2011-06-27 DIAGNOSIS — C50919 Malignant neoplasm of unspecified site of unspecified female breast: Secondary | ICD-10-CM

## 2011-06-27 DIAGNOSIS — D61818 Other pancytopenia: Secondary | ICD-10-CM

## 2011-06-27 LAB — CBC
Hemoglobin: 9.9 g/dL — ABNORMAL LOW (ref 12.0–15.0)
MCH: 29.8 pg (ref 26.0–34.0)
MCV: 89.2 fL (ref 78.0–100.0)
RBC: 3.32 MIL/uL — ABNORMAL LOW (ref 3.87–5.11)
WBC: 1.2 10*3/uL — CL (ref 4.0–10.5)

## 2011-06-27 LAB — GLUCOSE, CAPILLARY
Glucose-Capillary: 183 mg/dL — ABNORMAL HIGH (ref 70–99)
Glucose-Capillary: 139 mg/dL — ABNORMAL HIGH (ref 70–99)

## 2011-06-27 LAB — DIFFERENTIAL
Eosinophils Relative: 10 % — ABNORMAL HIGH (ref 0–5)
Lymphs Abs: 0.6 10*3/uL — ABNORMAL LOW (ref 0.7–4.0)
Monocytes Relative: 10 % (ref 3–12)
Neutro Abs: 0.4 10*3/uL — ABNORMAL LOW (ref 1.7–7.7)

## 2011-06-27 LAB — CARDIAC PANEL(CRET KIN+CKTOT+MB+TROPI)
CK, MB: 1.6 ng/mL (ref 0.3–4.0)
Total CK: 12 U/L (ref 7–177)
Troponin I: <0.3 ng/mL (ref <0.30)

## 2011-06-27 MED ORDER — ASPIRIN EC 81 MG PO TBEC
81.0000 mg | DELAYED_RELEASE_TABLET | Freq: Once | ORAL | Status: AC
  Administered 2011-06-27: 81 mg via ORAL
  Filled 2011-06-27: qty 1

## 2011-06-27 MED ORDER — NITROGLYCERIN 0.4 MG SL SUBL
SUBLINGUAL_TABLET | SUBLINGUAL | Status: AC
  Filled 2011-06-27: qty 25

## 2011-06-27 MED ORDER — LEVOFLOXACIN 750 MG PO TABS
750.0000 mg | ORAL_TABLET | Freq: Every day | ORAL | Status: DC
  Administered 2011-06-27 – 2011-07-02 (×5): 750 mg via ORAL
  Filled 2011-06-27 (×6): qty 1

## 2011-06-27 MED ORDER — NITROGLYCERIN 0.4 MG SL SUBL
0.4000 mg | SUBLINGUAL_TABLET | SUBLINGUAL | Status: DC | PRN
  Administered 2011-06-27: 0.4 mg via SUBLINGUAL

## 2011-06-27 NOTE — Progress Notes (Signed)
Pain down to 1-2 on 10 scale. No additional nitro SL given. Rapid response and Elray Mcgregor NP at bedside.

## 2011-06-27 NOTE — Progress Notes (Signed)
Subjective: Interval History: patient experiencing mid-sternal chest pain. Denies any radiation to left arm or neck. Denies nausea, vomiting. States mild dyspnea. Treated with Maalox for possible indigestion with no relief.  Objective: Vital signs in last 24 hours: Temp:  [98.1 F (36.7 C)-99.4 F (37.4 C)] 98.7 F (37.1 C) (05/25 2059) Pulse Rate:  [77-90] 90  (05/25 2059) Resp:  [16-18] 18  (05/25 2059) BP: (118-162)/(58-72) 124/72 mmHg (05/25 2059) SpO2:  [95 %-98 %] 97 % (05/25 2059)  Intake/Output from previous day: 05/24 0701 - 05/25 0700 In: 3440 [P.O.:240; I.V.:2600] Out: 1500 [Urine:1500] Intake/Output this shift:    BP 128/74  Pulse 88  Temp(Src) 98.7 F (37.1 C) (Oral)  Resp 18  Ht 5\' 8"  (1.727 m)  Wt 82.8 kg (182 lb 8.7 oz)  BMI 27.76 kg/m2  SpO2 97% General appearance: alert, cooperative and mild distress Neck: no adenopathy, no carotid bruit, no JVD and supple, symmetrical, trachea midline Lungs: clear to auscultation bilaterally Heart: regular rate and rhythm Abdomen: soft, non-tender; bowel sounds normal; no masses,  no organomegaly Extremities: extremities normal, atraumatic, no cyanosis or edema Pulses: 2+ and symmetric  Results for orders placed during the hospital encounter of 06/25/11 (from the past 24 hour(s))  GLUCOSE, CAPILLARY     Status: Abnormal   Collection Time   06/26/11 10:03 PM      Component Value Range   Glucose-Capillary 183 (*) 70 - 99 (mg/dL)  DIFFERENTIAL     Status: Abnormal   Collection Time   06/27/11  5:05 AM      Component Value Range   Neutrophils Relative 33 (*) 43 - 77 (%)   Lymphocytes Relative 44  12 - 46 (%)   Monocytes Relative 10  3 - 12 (%)   Eosinophils Relative 10 (*) 0 - 5 (%)   Basophils Relative 3 (*) 0 - 1 (%)   Neutro Abs 0.4 (*) 1.7 - 7.7 (K/uL)   Lymphs Abs 0.6 (*) 0.7 - 4.0 (K/uL)   Monocytes Absolute 0.1  0.1 - 1.0 (K/uL)   Eosinophils Absolute 0.1  0.0 - 0.7 (K/uL)   Basophils Absolute 0.0  0.0 -  0.1 (K/uL)   WBC Morphology TOXIC GRANULATION     Smear Review LARGE PLATELETS PRESENT    CBC     Status: Abnormal   Collection Time   06/27/11  5:05 AM      Component Value Range   WBC 1.2 (*) 4.0 - 10.5 (K/uL)   RBC 3.32 (*) 3.87 - 5.11 (MIL/uL)   Hemoglobin 9.9 (*) 12.0 - 15.0 (g/dL)   HCT 11.9 (*) 14.7 - 46.0 (%)   MCV 89.2  78.0 - 100.0 (fL)   MCH 29.8  26.0 - 34.0 (pg)   MCHC 33.4  30.0 - 36.0 (g/dL)   RDW 82.9  56.2 - 13.0 (%)   Platelets 51 (*) 150 - 400 (K/uL)  GLUCOSE, CAPILLARY     Status: Abnormal   Collection Time   06/27/11  7:37 AM      Component Value Range   Glucose-Capillary 164 (*) 70 - 99 (mg/dL)  GLUCOSE, CAPILLARY     Status: Abnormal   Collection Time   06/27/11 11:57 AM      Component Value Range   Glucose-Capillary 139 (*) 70 - 99 (mg/dL)  GLUCOSE, CAPILLARY     Status: Abnormal   Collection Time   06/27/11  5:10 PM      Component Value Range   Glucose-Capillary 147 (*)  70 - 99 (mg/dL)    Studies/Results: Dg Chest 1 View (portable)  06/25/2011  *RADIOLOGY REPORT*  Clinical Data: Fever, chemotherapy.  CHEST - 1 VIEW  Comparison: 05/22/2011  Findings: Right Port-A-Cath remains in place, unchanged.  No confluent airspace opacity or effusion.  Heart is normal size. Surgical clips in the left axilla.  IMPRESSION: No active cardiopulmonary disease.  Original Report Authenticated By: Cyndie Chime, M.D.    Scheduled Meds:   . acyclovir  400 mg Oral Q6H  . atorvastatin  40 mg Oral Daily  . docusate sodium  100 mg Oral BID  . filgrastim (NEUPOGEN)  SQ  300 mcg Subcutaneous q1800  . insulin aspart  0-15 Units Subcutaneous TID WC  . insulin aspart  0-5 Units Subcutaneous QHS  . insulin glargine  15 Units Subcutaneous QHS  . lactose free nutrition  237 mL Oral QHS  . levofloxacin  750 mg Oral Daily  . nitroGLYCERIN      . pantoprazole  40 mg Oral Q1200  . sertraline  50 mg Oral Daily  . sodium chloride  3 mL Intravenous Q12H  . DISCONTD:  imipenem-cilastatin  500 mg Intravenous Q6H  . DISCONTD: vancomycin  1,000 mg Intravenous Q12H   Continuous Infusions:   . sodium chloride 1,000 mL (06/27/11 1608)   PRN Meds:acetaminophen, acetaminophen, albuterol, alum & mag hydroxide-simeth, guaiFENesin-dextromethorphan, HYDROcodone-acetaminophen, magic mouthwash w/lidocaine, nitroGLYCERIN, ondansetron (ZOFRAN) IV, ondansetron, prochlorperazine  Assessment/Plan: Chest pain: EKG shows Sinus with PAC's - ? Septal infarct, undetermined age. HR 90's, BP stable, oxygen saturation 97% on room air. Placed on oxygen. Sublingual NTG given x 1 with reduction in pain from 5 on 1-10 scale to 1-2. Cardiac enzymes ordered. Gave ASA 81 mg and transfer to telemetry.   LOS: 2 days   Kriste Broman A.

## 2011-06-27 NOTE — Progress Notes (Signed)
Patient well known to me. Admitted for febrile neutropenia. She overall is slowly improving. She was started on neupogen. Her WBC today is 1.2. Platelets, 51,000. She is now afebrile.  A/P:   1. Breast cancer: s/p adjuvant chemotherapy with resultant pancytopenia, counts now improving.  2. ID: all cultures are negative up to date, on broadspectrum antibiotics  3. Once patient is clinically stable she can be discharged to home. She will need follow up with me on 06/30/11.   Drue Second, MD Medical/Oncology [facility] [phone] (beeper) [phone] (Office)  06/27/2011, 9:27 AM

## 2011-06-27 NOTE — Progress Notes (Addendum)
Pt reports she has pain in chest , mid sternal and lower back. Pt reports "I think it is indigestion because I ate cucumbers today". No diaphoresis, denies radiating pain. Abnormal heart sound noted as well at ausculation. Vital signs taken, given prn for indigestion as requested by pt and will reavaluate in 20 minutes

## 2011-06-27 NOTE — Progress Notes (Signed)
Placed on 2l/[place] oxygen. [name] will be up to see pt. Rapid response notified.

## 2011-06-27 NOTE — Progress Notes (Signed)
Called to room 1317 per floor RN. Patient experiencing mid-sternal chest pain. Denies any radiation to left arm or neck. Denies nausea, vomiting. Denies SOB upon my arrival. Given Maalox for possible indigestion with no relief prior to my arrival.  NTG SL X1 given at 2140, at 2145 pt admits relief of pain, now 2/10 chest pain. Daphane Shepherd NP at bedside, EKG done resulting in Sinus Rhthym with PAC's -  Septal infarct, undetermined age. Per Daphane Shepherd this is different from a prior EKG done this year viewed in EPIC.  Cardiac enzymes drawn, ordered X 3 tonight. Aspirin po ordered awaiting from pharmacy. Will transfer pt to telemetry for closer monitoring. Pt resting at this time quietly in bed, A O X 4.   VS 2145 93 SR 135/75  99 % 2 LNC 14 RR 2/10 pain chest  2200 90 SR PVCs 129/61 99% 15 RR 1/10  2215 83 SR 132/62 98% 19 RR 1/10   2245 91 SR 110/72  16 RR 98 % 1/10  Pt transferred to room1405 at 2245, report called per floor 3 east RN to [employer] on [employer]. Aspirin given. Rn encouraged to call results of cardiac enzymes to triad NP once resulted, and to call RRT for further problems.

## 2011-06-27 NOTE — Progress Notes (Signed)
PROGRESS NOTE  [name] WUJ:[id number] DOB: 04/02/1940 DOA: 06/25/2011 PCP: Michele Mcalpine, MD, MD Oncologist: Dr. Drue Second  Brief narrative: [name] is a 71 year old female with a PMH of stage IIIA invasive lobular carcinoma of the breast s/p left mastectomy with axillary lymph node dissection on 03/23/11 complicated by an open wound post-operatively who was admitted on 06/25/11 with fever to 103.2 and neutropenia.  Assessment/Plan: Principal Problem:  *Neutropenic fever  Admitted and placed on broad spectrum antibiotics consisting of Vancomycin and Imipenem.  Will narrow to Levaquin and monitor.  CXR negative for PNA, U/A not consistent with UTI.  H/O open breast wound, now well healed with no open areas.  Continue Neupogen, WBC recovering. Active Problems:  DIABETES MELLITUS, uncontrolled  Hemoglobin A1c 8.6%, CBGs 200s.  Glucophage and Januvia on hold.  Currently on Lantus 15 units Q HS and moderate scale SSI.  Breast cancer, ILC, left, receptor +, her 2 neg  Completed adjuvant chemotherapy with FEC 06/18/11.  Single agent taxol planned weekly x 12 weeks to begin 07/09/11.  Seen by Dr. Welton Flakes 06/27/11; F/U with her as outpatient.  Pancytopenia / thrombocytopenia  Secondary to chemotherapy.  On neupogen for neutropenia.  Other counts stable, monitor.  Hyponatremia  Resolved with IVF.  Hypertension  Norvasc and Lotensin on hold.  Hypercholesterolemia  Continue Lipitor.  Anxiety / depression  Continue Zoloft.  GERD  Continue PPI therapy.   Code Status: Full Family Communication: None at bedside. Disposition Plan: Home when stable.  Medical Consultants:  Dr. Drue Second, Oncology  Other consultants:  None  Antibiotics:  Vancomycin 06/25/11--->06/27/11  Primaxin 06/25/11--->06/27/11  Levaquin 06/27/11--->  Acyclovir 06/25/11--->   Subjective  [name] continues to deny any localizing symptoms such as dyspnea, cough, nausea,  vomiting, diarrhea, or skin wounds, although she notes some soreness to the left mastectomy site.   Objective    Interim History: Stable overnight.   Objective: Filed Vitals:   06/27/11 0100 06/27/11 0213 06/27/11 0546 06/27/11 1000  BP: 124/68 124/68 128/64 118/70  Pulse:  84 77 81  Temp:  98.4 F (36.9 C) 98.3 F (36.8 C) 98.3 F (36.8 C)  TempSrc:  Oral Oral Oral  Resp:  16 16 18   Height:      Weight:      SpO2:  98% 96% 97%    Intake/Output Summary (Last 24 hours) at 06/27/11 1415 Last data filed at 06/27/11 0800  Gross per 24 hour  Intake   3680 ml  Output   1100 ml  Net   2580 ml    Exam: Gen:  NAD Oropharynx: No lesions or thrush. Cardiovascular:  RRR, No M/R/G Chest/skin: Port-A-Cath site without local signs of infection. Left breast wound well healed.  No skin erythema left chest wall. Respiratory: Lungs CTAB Gastrointestinal: Abdomen soft, NT/ND with normal active bowel sounds. Extremities: No C/E/C    Data Reviewed: Basic Metabolic Panel:  Lab 06/26/11 9562 06/25/11 1642 06/25/11 0920  NA 137 132* 141  K 3.7 3.7 --  CL 101 97 103  CO2 26 25 29   GLUCOSE 218* 203* 237*  BUN 8 11 11   CREATININE 0.54 0.51 0.64  CALCIUM 8.8 8.7 9.4  MG 1.6 -- --  PHOS 2.8 -- --   GFR Estimated Creatinine Clearance: 73.9 ml/min (by C-G formula based on Cr of 0.54). Liver Function Tests:  Lab 06/26/11 0510 06/25/11 1642 06/25/11 0920  AST 12 21 17   ALT 17 21 20   ALKPHOS 85 99  96  BILITOT 0.6 0.4 0.8  PROT 5.7* 5.9* 5.8*  ALBUMIN 2.9* 3.2* 3.9   CBC:  Lab 06/27/11 0505 06/26/11 0510 06/25/11 1642 06/25/11 0920  WBC 1.2* 0.8* 0.6* 1.0*  NEUTROABS 0.4* -- 0* 0.4*  HGB 9.9* 10.2* 11.0* 12.1  HCT 29.6* 31.1* 33.1* 36.4  MCV 89.2 89.1 89.0 87.9  PLT 51* 46* 56* 58*   CBG:  Lab 06/27/11 1157 06/27/11 0737 06/26/11 2203 06/26/11 1609 06/26/11 1210  GLUCAP 139* 164* 183* 164* 128*   Hgb A1c  Basename 06/25/11 2044  HGBA1C 8.6*    Microbiology Recent Results (from the past 240 hour(s))  CULTURE, BLOOD (ROUTINE X 2)     Status: Normal (Preliminary result)   Collection Time   06/25/11  4:42 PM      Component Value Range Status Comment   Specimen Description BLOOD PORTA CATH   Final    Special Requests BOTTLES DRAWN AEROBIC AND ANAEROBIC 10CC   Final    Culture  Setup Time [id number]   Final    Culture     Final    Value:        BLOOD CULTURE RECEIVED NO GROWTH TO DATE CULTURE WILL BE HELD FOR 5 DAYS BEFORE ISSUING A FINAL NEGATIVE REPORT   Report Status PENDING   Incomplete   CULTURE, BLOOD (ROUTINE X 2)     Status: Normal (Preliminary result)   Collection Time   06/25/11  4:50 PM      Component Value Range Status Comment   Specimen Description BLOOD RIGHT ARM   Final    Special Requests BOTTLES DRAWN AEROBIC AND ANAEROBIC 5CC   Final    Culture  Setup Time [id number]   Final    Culture     Final    Value:        BLOOD CULTURE RECEIVED NO GROWTH TO DATE CULTURE WILL BE HELD FOR 5 DAYS BEFORE ISSUING A FINAL NEGATIVE REPORT   Report Status PENDING   Incomplete   URINE CULTURE     Status: Normal   Collection Time   06/25/11  8:40 PM      Component Value Range Status Comment   Specimen Description URINE, CLEAN CATCH   Final    Special Requests Immunocompromised   Final    Culture  Setup Time [id number]   Final    Colony Count NO GROWTH   Final    Culture NO GROWTH   Final    Report Status 06/26/2011 FINAL   Final   MRSA PCR SCREENING     Status: Normal   Collection Time   06/25/11 11:41 PM      Component Value Range Status Comment   MRSA by PCR NEGATIVE  NEGATIVE  Final     Procedures and Diagnostic Studies:  Dg Chest 1 View (portable) 06/25/2011 IMPRESSION: No active cardiopulmonary disease.  Original Report Authenticated By: Cyndie Chime, M.D.    Scheduled Meds:    . acyclovir  400 mg Oral Q6H  . atorvastatin  40 mg Oral Daily  . docusate sodium  100 mg Oral BID  . filgrastim (NEUPOGEN)   SQ  300 mcg Subcutaneous q1800  . imipenem-cilastatin  500 mg Intravenous Q6H  . insulin aspart  0-15 Units Subcutaneous TID WC  . insulin aspart  0-5 Units Subcutaneous QHS  . insulin glargine  15 Units Subcutaneous QHS  . lactose free nutrition  237 mL Oral QHS  . pantoprazole  40 mg Oral Q1200  .  sertraline  50 mg Oral Daily  . sodium chloride  3 mL Intravenous Q12H  . vancomycin  1,000 mg Intravenous Q12H   Continuous Infusions:    . sodium chloride 100 mL/hr at 06/27/11 0356      LOS: 2 days   Hillery Aldo, MD Pager [phone]  06/27/2011, 2:15 PM

## 2011-06-27 NOTE — Progress Notes (Signed)
Pt gripping chest and reports no relief from PRN given for indigestion. MD paged.

## 2011-06-28 ENCOUNTER — Inpatient Hospital Stay (HOSPITAL_COMMUNITY): Payer: Medicare Other

## 2011-06-28 DIAGNOSIS — C50919 Malignant neoplasm of unspecified site of unspecified female breast: Secondary | ICD-10-CM

## 2011-06-28 DIAGNOSIS — L02219 Cutaneous abscess of trunk, unspecified: Secondary | ICD-10-CM

## 2011-06-28 DIAGNOSIS — E118 Type 2 diabetes mellitus with unspecified complications: Secondary | ICD-10-CM

## 2011-06-28 DIAGNOSIS — E1165 Type 2 diabetes mellitus with hyperglycemia: Secondary | ICD-10-CM

## 2011-06-28 DIAGNOSIS — L0390 Cellulitis, unspecified: Secondary | ICD-10-CM | POA: Diagnosis present

## 2011-06-28 DIAGNOSIS — R079 Chest pain, unspecified: Secondary | ICD-10-CM

## 2011-06-28 DIAGNOSIS — L03319 Cellulitis of trunk, unspecified: Secondary | ICD-10-CM

## 2011-06-28 LAB — DIFFERENTIAL
Basophils Relative: 1 % (ref 0–1)
Eosinophils Absolute: 0.3 10*3/uL (ref 0.0–0.7)
Eosinophils Relative: 7 % — ABNORMAL HIGH (ref 0–5)
Lymphocytes Relative: 19 % (ref 12–46)
Neutrophils Relative %: 63 % (ref 43–77)
WBC Morphology: INCREASED

## 2011-06-28 LAB — CARDIAC PANEL(CRET KIN+CKTOT+MB+TROPI)
CK, MB: 1.7 ng/mL (ref 0.3–4.0)
CK, MB: 1.8 ng/mL (ref 0.3–4.0)
Relative Index: INVALID (ref 0.0–2.5)
Total CK: 12 U/L (ref 7–177)
Troponin I: <0.3 ng/mL (ref <0.30)

## 2011-06-28 LAB — CBC
MCV: 88.9 fL (ref 78.0–100.0)
Platelets: 54 10*3/uL — ABNORMAL LOW (ref 150–400)
RBC: 3.41 MIL/uL — ABNORMAL LOW (ref 3.87–5.11)
RDW: 14.9 % (ref 11.5–15.5)
WBC: 4.9 10*3/uL (ref 4.0–10.5)

## 2011-06-28 LAB — GLUCOSE, CAPILLARY
Glucose-Capillary: 182 mg/dL — ABNORMAL HIGH (ref 70–99)
Glucose-Capillary: 116 mg/dL — ABNORMAL HIGH (ref 70–99)
Glucose-Capillary: 138 mg/dL — ABNORMAL HIGH (ref 70–99)
Glucose-Capillary: 119 mg/dL — ABNORMAL HIGH (ref 70–99)

## 2011-06-28 MED ORDER — ZOLPIDEM TARTRATE 5 MG PO TABS
5.0000 mg | ORAL_TABLET | Freq: Every evening | ORAL | Status: DC | PRN
  Administered 2011-06-28 – 2011-06-29 (×2): 5 mg via ORAL
  Filled 2011-06-28 (×2): qty 1

## 2011-06-28 MED ORDER — LORAZEPAM 1 MG PO TABS
1.0000 mg | ORAL_TABLET | Freq: Three times a day (TID) | ORAL | Status: DC | PRN
  Administered 2011-06-28 – 2011-07-01 (×2): 1 mg via ORAL
  Filled 2011-06-28 (×2): qty 1

## 2011-06-28 MED ORDER — IOHEXOL 300 MG/ML  SOLN
100.0000 mL | Freq: Once | INTRAMUSCULAR | Status: AC | PRN
  Administered 2011-06-28: 100 mL via INTRAVENOUS

## 2011-06-28 MED ORDER — VANCOMYCIN HCL IN DEXTROSE 1-5 GM/200ML-% IV SOLN
1000.0000 mg | Freq: Two times a day (BID) | INTRAVENOUS | Status: DC
  Administered 2011-06-28 – 2011-07-02 (×9): 1000 mg via INTRAVENOUS
  Filled 2011-06-28 (×9): qty 200

## 2011-06-28 MED ORDER — VANCOMYCIN HCL IN DEXTROSE 1-5 GM/200ML-% IV SOLN: 1000.0000 mg | INTRAVENOUS | Status: DC

## 2011-06-28 NOTE — Progress Notes (Addendum)
Patient states she is tired due to interruptions throughout the night.  Requesting ativan this am. Paged MD. Order received.  Patient states more relaxed after taking ativan

## 2011-06-28 NOTE — Progress Notes (Signed)
ANTIBIOTIC CONSULT NOTE - INITIAL  Pharmacy Consult for Vancomycin Indication: cellulitis  Allergies  Allergen Reactions  . Ceftriaxone Sodium     REACTION: hives  . Cephalexin     REACTION: hives  . Clindamycin     REACTION: hives  . Dilaudid (Hydromorphone Hcl) Itching  . Oxycodone-Acetaminophen     REACTION: hives  . Rocephin (Ceftriaxone Sodium In Dextrose) Hives  . Sulfonamide Derivatives     REACTION: hives   Patient Measurements: Height: 5\' 8"  (172.7 cm) Weight: 180 lb 12.4 oz (82 kg) IBW/kg (Calculated) : 63.9   Vital Signs: Temp: 98.2 F (36.8 C) (05/26 0546) Temp src: Oral (05/26 0546) BP: 130/72 mmHg (05/26 0546) Pulse Rate: 80  (05/26 0546)  Labs:  Basename 06/28/11 0540 06/27/11 0505 06/26/11 0510 06/25/11 1642  WBC 4.9 1.2* 0.8* --  HGB 10.2* 9.9* 10.2* --  PLT 54* 51* 46* --  LABCREA -- -- -- --  CREATININE -- -- 0.54 0.51   Estimated Creatinine Clearance: 73.4 ml/min (by C-G formula based on Cr of 0.54). No results found for this basename: VANCOTROUGH:2,VANCOPEAK:2,VANCORANDOM:2,GENTTROUGH:2,GENTPEAK:2,GENTRANDOM:2,TOBRATROUGH:2,TOBRAPEAK:2,TOBRARND:2,AMIKACINPEAK:2,AMIKACINTROU:2,AMIKACIN:2, in the last 72 hours   Microbiology: Recent Results (from the past 720 hour(s))  CULTURE, BLOOD (ROUTINE X 2)     Status: Normal (Preliminary result)   Collection Time   06/25/11  4:42 PM      Component Value Range Status Comment   Specimen Description BLOOD PORTA CATH   Final    Special Requests BOTTLES DRAWN AEROBIC AND ANAEROBIC 10CC   Final    Culture  Setup Time [id number]   Final    Culture     Final    Value:        BLOOD CULTURE RECEIVED NO GROWTH TO DATE CULTURE WILL BE HELD FOR 5 DAYS BEFORE ISSUING A FINAL NEGATIVE REPORT   Report Status PENDING   Incomplete   CULTURE, BLOOD (ROUTINE X 2)     Status: Normal (Preliminary result)   Collection Time   06/25/11  4:50 PM      Component Value Range Status Comment   Specimen Description BLOOD RIGHT  ARM   Final    Special Requests BOTTLES DRAWN AEROBIC AND ANAEROBIC 5CC   Final    Culture  Setup Time [id number]   Final    Culture     Final    Value:        BLOOD CULTURE RECEIVED NO GROWTH TO DATE CULTURE WILL BE HELD FOR 5 DAYS BEFORE ISSUING A FINAL NEGATIVE REPORT   Report Status PENDING   Incomplete   URINE CULTURE     Status: Normal   Collection Time   06/25/11  8:40 PM      Component Value Range Status Comment   Specimen Description URINE, CLEAN CATCH   Final    Special Requests Immunocompromised   Final    Culture  Setup Time [id number]   Final    Colony Count NO GROWTH   Final    Culture NO GROWTH   Final    Report Status 06/26/2011 FINAL   Final   MRSA PCR SCREENING     Status: Normal   Collection Time   06/25/11 11:41 PM      Component Value Range Status Comment   MRSA by PCR NEGATIVE  NEGATIVE  Final     Medical History: Past Medical History  Diagnosis Date  . DIABETES MELLITUS 08/10/2007  . 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 HYPERCHOLESTEROLEMIA 12/17/2006  . OBESITY 06/04/2008  .  ANXIETY 06/04/2008  . DEPRESSION 08/10/2007  . HYPERTENSION 12/17/2006  . ASTHMATIC BRONCHITIS, ACUTE 06/04/2008  . ALLERGIC RHINITIS 12/13/2007  . GERD 12/17/2006  . DEGENERATIVE JOINT DISEASE 12/17/2006  . Asthma   . Blood transfusion 1964    post childbirth  . Breast cancer, IXC, Right, receptor +, her 2 neg 02/20/2011    left    Medications:  Anti-infectives     Start     Dose/Rate Route Frequency Ordered Stop   06/28/11 1200   vancomycin (VANCOCIN) IVPB 1000 mg/200 mL premix  Status:  Discontinued        1,000 mg 200 mL/hr over 60 Minutes Intravenous Every 48 hours 06/28/11 1155 06/28/11 1206   06/27/11 1500   levofloxacin (LEVAQUIN) tablet 750 mg        750 mg Oral Daily 06/27/11 1427     06/26/11 0600   vancomycin (VANCOCIN) IVPB 1000 mg/200 mL premix  Status:  Discontinued        1,000 mg 200 mL/hr over 60 Minutes Intravenous Every 12 hours 06/25/11 2047 06/27/11 1427   06/26/11 0000    imipenem-cilastatin (PRIMAXIN) 500 mg in sodium chloride 0.9 % 100 mL IVPB  Status:  Discontinued        500 mg 200 mL/hr over 30 Minutes Intravenous 4 times per day 06/25/11 2047 06/27/11 1427   06/25/11 2200   acyclovir (ZOVIRAX) 200 MG capsule 400 mg        400 mg Oral Every 6 hours 06/25/11 2017     06/25/11 1830   imipenem-cilastatin (PRIMAXIN) 500 mg in sodium chloride 0.9 % 100 mL IVPB        500 mg 200 mL/hr over 30 Minutes Intravenous  Once 06/25/11 1655 06/25/11 1925   06/25/11 1730   vancomycin (VANCOCIN) IVPB 1000 mg/200 mL premix        1,000 mg 200 mL/hr over 60 Minutes Intravenous  Once 06/25/11 1655 06/25/11 1830         Assessment:  70 YOF admit with neutropenic fever.  History of breast cancer, s/p left mastectomy (03/23/11), s/p FEC-100 chemo (04/17/11 -06/18/11), planning to start single agent taxol weekly x 12 weeks on 07/10/11.  Neulasta given 06/19/11; Neupogen started 06/25/11  Restarting vancomycin for indication of cellulitis  Note that patient is also now Day #2 PO Levaquin  Patient was on vancomycin plus Primaxin from 5/23 to 5/25  Renal function appears normal with CrCl ~74 ml/min  Goal of Therapy:  Vanc Trough 10-15 mcg/mL  Plan:   Restart Vancomycin 1g IV q12  Check Vanc Trough prn   Hessie Knows, PharmD, BCPS Pager [phone] 06/28/2011 12:12 PM

## 2011-06-28 NOTE — Progress Notes (Signed)
PROGRESS NOTE  [name]:[id number] DOB: [birth year] DOA: 06/25/2011 PCP: Michele Mcalpine, MD, MD Oncologist: Dr. Drue Second  Brief narrative: [name] is a 71 year old female with a PMH of stage IIIA invasive lobular carcinoma of the breast s/p left mastectomy with axillary lymph node dissection on 03/23/11 complicated by an open wound post-operatively who was admitted on 06/25/11 with fever to 103.2 and neutropenia.  Developed substernal chest pain on 06/27/11 necessitating upgrade to tele bed.  Assessment/Plan: Principal Problem:  *Neutropenic fever probably due to left chest wall cellulitis  Admitted and placed on broad spectrum antibiotics consisting of Vancomycin and Imipenem, narrowed to Levaquin on 06/27/11.  CXR negative for PNA, U/A not consistent with UTI.  H/O open breast wound, now well healed with no open areas, but some erythema noted to chest wall skin 06/28/11.  Will resume Vancomycin for now.  Continue Neupogen, WBC recovered. Active Problems:  Chest pain  Developed acute onset chest pain 06/27/11 triggering a rapid response evaluation.  12 lead EKG without ST elevation, cardiac markers negative x 2 sets.  Suspect pain from cellulitis of left chest wall.  Chest pain resolved with NTG (had also been given Maalox without initial relief), pain medication.  Given h/o malignancy, will rule out PE (check D-dimer, and if elevated, obtain CT chest)  DIABETES MELLITUS, uncontrolled  Hemoglobin A1c 8.6%, CBGs 116-182.  Glucophage and Januvia on hold.  Currently on Lantus 15 units Q HS and moderate scale SSI.  Breast cancer, ILC, left, receptor +, her 2 neg  Completed adjuvant chemotherapy with [facility]EC 06/18/11.  Single agent taxol planned weekly x 12 weeks to begin 07/09/11.  Seen by Dr. Welton Flakes 06/27/11; F/U with her as outpatient.  Pancytopenia / thrombocytopenia  Secondary to chemotherapy.  On neupogen for neutropenia (day #4).  Other counts stable,  monitor.  Hyponatremia  Resolved with IVF.  Hypertension  Norvasc and Lotensin on hold.  Hypercholesterolemia  Continue Lipitor.  Anxiety / depression  Continue Zoloft.  GERD  Continue PPI therapy.   Code Status: Full Family Communication: None at bedside. Disposition Plan: Home when stable.  Medical Consultants:  Dr. Drue Second, Oncology  Other consultants:  None  Antibiotics:  Vancomycin 06/25/11--->06/27/11, resumed 06/28/11--->  Primaxin 06/25/11--->06/27/11  Levaquin 06/27/11--->  Acyclovir 06/25/11--->   Subjective  Mrs. Buley complains of chest wall soreness on the left.  She states she had dull chest pain for several hours yesterday, that worsened over time, but was not associated with dyspnea or diaphoresis.  Did not sleep well last night with all the interruptions for blood work, etc.    Objective    Interim History: Moved to tele bed overnight due to complaints of chest pain.   Objective: Filed Vitals:   06/27/11 2059 06/27/11 2142 06/27/11 2318 06/28/11 0546  BP: 124/72 128/74 129/66 130/72  Pulse: 90 88 78 80  Temp: 98.7 F (37.1 C)  98.8 F (37.1 C) 98.2 F (36.8 C)  TempSrc: Oral  Oral Oral  Resp: 18 18 18 20   Height:   5\' 8"  (1.727 m)   Weight:   82 kg (180 lb 12.4 oz)   SpO2: 97%  100% 97%    Intake/Output Summary (Last 24 hours) at 06/28/11 1057 Last data filed at 06/28/11 1013  Gross per 24 hour  Intake    720 ml  Output    600 ml  Net    120 ml    Exam: Gen:  NAD Oropharynx: No lesions or thrush.  Cardiovascular:  RRR, No M/R/G Chest/skin: Port-A-Cath site without local signs of infection. Left breast wound well healed.  New skin erythema left chest wall. Respiratory: Lungs CTAB Gastrointestinal: Abdomen soft, NT/ND with normal active bowel sounds. Extremities: No C/E/C    Data Reviewed: Basic Metabolic Panel:  Lab 06/26/11 8469 06/25/11 1642 06/25/11 0920  NA 137 132* 141  K 3.7 3.7 --  CL 101 97 103  CO2  26 25 29   GLUCOSE 218* 203* 237*  BUN 8 11 11   CREATININE 0.54 0.51 0.64  CALCIUM 8.8 8.7 9.4  MG 1.6 -- --  PHOS 2.8 -- --   GFR Estimated Creatinine Clearance: 73.4 ml/min (by C-G formula based on Cr of 0.54). Liver Function Tests:  Lab 06/26/11 0510 06/25/11 1642 06/25/11 0920  AST 12 21 17   ALT 17 21 20   ALKPHOS 85 99 96  BILITOT 0.6 0.4 0.8  PROT 5.7* 5.9* 5.8*  ALBUMIN 2.9* 3.2* 3.9   CBC:  Lab 06/28/11 0540 06/27/11 0505 06/26/11 0510 06/25/11 1642 06/25/11 0920  WBC 4.9 1.2* 0.8* 0.6* 1.0*  NEUTROABS 3.1 0.4* -- 0* 0.4*  HGB 10.2* 9.9* 10.2* 11.0* 12.1  HCT 30.3* 29.6* 31.1* 33.1* 36.4  MCV 88.9 89.2 89.1 89.0 87.9  PLT 54* 51* 46* 56* 58*   Cardiac Enzymes:  Lab 06/28/11 0540 06/27/11 2201  CKTOTAL 13 12  CKMB 1.7 1.6  CKMBINDEX -- --  TROPONINI <0.30 <0.30   CBG:  Lab 06/28/11 0734 06/27/11 2101 06/27/11 1710 06/27/11 1157 06/27/11 0737  GLUCAP 116* 182* 147* 139* 164*   Hgb A1c  Basename 06/25/11 2044  HGBA1C 8.6*   Microbiology Recent Results (from the past 240 hour(s))  CULTURE, BLOOD (ROUTINE X 2)     Status: Normal (Preliminary result)   Collection Time   06/25/11  4:42 PM      Component Value Range Status Comment   Specimen Description BLOOD PORTA CATH   Final    Special Requests BOTTLES DRAWN AEROBIC AND ANAEROBIC 10CC   Final    Culture  Setup Time [id number]   Final    Culture     Final    Value:        BLOOD CULTURE RECEIVED NO GROWTH TO DATE CULTURE WILL BE HELD FOR 5 DAYS BEFORE ISSUING A FINAL NEGATIVE REPORT   Report Status PENDING   Incomplete   CULTURE, BLOOD (ROUTINE X 2)     Status: Normal (Preliminary result)   Collection Time   06/25/11  4:50 PM      Component Value Range Status Comment   Specimen Description BLOOD RIGHT ARM   Final    Special Requests BOTTLES DRAWN AEROBIC AND ANAEROBIC 5CC   Final    Culture  Setup Time [id number]   Final    Culture     Final    Value:        BLOOD CULTURE RECEIVED NO GROWTH TO DATE  CULTURE WILL BE HELD FOR 5 DAYS BEFORE ISSUING A FINAL NEGATIVE REPORT   Report Status PENDING   Incomplete   URINE CULTURE     Status: Normal   Collection Time   06/25/11  8:40 PM      Component Value Range Status Comment   Specimen Description URINE, CLEAN CATCH   Final    Special Requests Immunocompromised   Final    Culture  Setup Time [id number]   Final    Colony Count NO GROWTH   Final    Culture  NO GROWTH   Final    Report Status 06/26/2011 FINAL   Final   MRSA PCR SCREENING     Status: Normal   Collection Time   06/25/11 11:41 PM      Component Value Range Status Comment   MRSA by PCR NEGATIVE  NEGATIVE  Final       Procedures and Diagnostic Studies:  Dg Chest 1 View (portable) 06/25/2011 IMPRESSION: No active cardiopulmonary disease.  Original Report Authenticated By: Cyndie Chime, M.D.    Scheduled Meds:    . acyclovir  400 mg Oral Q6H  . aspirin EC  81 mg Oral Once  . atorvastatin  40 mg Oral Daily  . docusate sodium  100 mg Oral BID  . filgrastim (NEUPOGEN)  SQ  300 mcg Subcutaneous q1800  . insulin aspart  0-15 Units Subcutaneous TID WC  . insulin aspart  0-5 Units Subcutaneous QHS  . insulin glargine  15 Units Subcutaneous QHS  . lactose free nutrition  237 mL Oral QHS  . levofloxacin  750 mg Oral Daily  . nitroGLYCERIN      . pantoprazole  40 mg Oral Q1200  . sertraline  50 mg Oral Daily  . sodium chloride  3 mL Intravenous Q12H  . DISCONTD: imipenem-cilastatin  500 mg Intravenous Q6H  . DISCONTD: vancomycin  1,000 mg Intravenous Q12H   Continuous Infusions:    . sodium chloride 1,000 mL (06/27/11 1608)      LOS: 3 days   Hillery Aldo, MD Pager [phone]  06/28/2011, 10:57 AM

## 2011-06-29 DIAGNOSIS — L03319 Cellulitis of trunk, unspecified: Secondary | ICD-10-CM

## 2011-06-29 DIAGNOSIS — B3781 Candidal esophagitis: Secondary | ICD-10-CM | POA: Diagnosis present

## 2011-06-29 DIAGNOSIS — R079 Chest pain, unspecified: Secondary | ICD-10-CM

## 2011-06-29 DIAGNOSIS — C50919 Malignant neoplasm of unspecified site of unspecified female breast: Secondary | ICD-10-CM

## 2011-06-29 DIAGNOSIS — E118 Type 2 diabetes mellitus with unspecified complications: Secondary | ICD-10-CM

## 2011-06-29 DIAGNOSIS — L02219 Cutaneous abscess of trunk, unspecified: Secondary | ICD-10-CM

## 2011-06-29 DIAGNOSIS — E1165 Type 2 diabetes mellitus with hyperglycemia: Secondary | ICD-10-CM

## 2011-06-29 DIAGNOSIS — IMO0002 Reserved for concepts with insufficient information to code with codable children: Secondary | ICD-10-CM

## 2011-06-29 LAB — DIFFERENTIAL
Basophils Relative: 2 % — ABNORMAL HIGH (ref 0–1)
Eosinophils Absolute: 0.4 10*3/uL (ref 0.0–0.7)
Eosinophils Relative: 5 % (ref 0–5)
Lymphocytes Relative: 13 % (ref 12–46)
Monocytes Relative: 8 % (ref 3–12)
Neutro Abs: 6 10*3/uL (ref 1.7–7.7)
Neutrophils Relative %: 72 % (ref 43–77)

## 2011-06-29 LAB — CBC
MCHC: 33.1 g/dL (ref 30.0–36.0)
Platelets: 87 10*3/uL — ABNORMAL LOW (ref 150–400)
RDW: 15.2 % (ref 11.5–15.5)
WBC: 8.4 10*3/uL (ref 4.0–10.5)

## 2011-06-29 LAB — GLUCOSE, CAPILLARY
Glucose-Capillary: 200 mg/dL — ABNORMAL HIGH (ref 70–99)
Glucose-Capillary: 137 mg/dL — ABNORMAL HIGH (ref 70–99)
Glucose-Capillary: 135 mg/dL — ABNORMAL HIGH (ref 70–99)

## 2011-06-29 MED ORDER — FLUCONAZOLE 100 MG PO TABS
100.0000 mg | ORAL_TABLET | Freq: Every day | ORAL | Status: DC
  Administered 2011-06-29 – 2011-07-02 (×3): 100 mg via ORAL
  Filled 2011-06-29 (×4): qty 1

## 2011-06-29 MED ORDER — SODIUM CHLORIDE 0.9 % IJ SOLN: 10.0000 mL | INTRAMUSCULAR | Status: DC | PRN

## 2011-06-29 NOTE — Consult Note (Signed)
Reason for Consult:? Left mastectomy site infection Referring Physician: Dr. Bella Kennedy [name] is an 71 y.o. female.  HPI: 6 yof with history of IIIA lobular cancer who underwent left MRM 2/8 with Dr. Jamey Ripa. Drains were out then had issue with infection and open wound which eventually healed.  She was then started on [facility] after having port placed which she completed on 5/16.  She is due to begin 12 weeks of a taxane next.  She was admitted on 5/23 with neutropenic fever.  On 5/26 there was some possible erythema noted on her left mastectomy scar and flaps.  She complains of some tenderness there.  She is not febrile now.  She has been on vanc initially then since 5/26.  She underwent a ct scan for pe that shows a left chest wall fluid collection.   Past Medical History  Diagnosis Date  . DIABETES MELLITUS 08/10/2007  . 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 HYPERCHOLESTEROLEMIA 12/17/2006  . OBESITY 06/04/2008  . ANXIETY 06/04/2008  . DEPRESSION 08/10/2007  . HYPERTENSION 12/17/2006  . ASTHMATIC BRONCHITIS, ACUTE 06/04/2008  . ALLERGIC RHINITIS 12/13/2007  . GERD 12/17/2006  . DEGENERATIVE JOINT DISEASE 12/17/2006  . Asthma   . Blood transfusion 1964    post childbirth  . 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 Laminectomy [id number]    s/p-Dr. Jule Ser  . Bladder tack 1974  . Cholecystectomy 02/23/1991    [facility] - Dr Jamey Ripa  . Tonsillectomy   . Rotator cuff repair 8/03    right, Dr. Shelle Iron  . Enterocele repair 06/08    Dr. Estanislado Pandy  . Rotator cuff repair 10/26/2008    left, Dr. Rayburn Ma  . Hip surgery 08/27/2010    DR. Despina Hick  . Breast excisional biopsy 11/10/1988    left benign Dr Jamey Ripa  . Breast excisional biopsy 03/04/1983    Right - two areas - Dr Jamey Ripa  . Breast excisional biopsy 10/09/1980    right - Dr Jamey Ripa  . Breast excisional biopsy 10/18/1979    left - Dr Jamey Ripa  . Breast excisional biopsy 01/02/1994    right - Dr  Jamey Ripa  . Vaginal hysterectomy 1974    partial   . Colonoscopy   . Modified radical mastectomy w/ axillary lymph node dissection     03-30-11 LT  . Portacath placement 04/13/2011    Procedure: INSERTION PORT-A-CATH;  Surgeon: Currie Paris, MD;  Location:  [facility];  Service: General;  Laterality: N/A;  porta cath placement,removal of J-P drain  . Mastectomy 03/23/2011    left    Family History  Problem Relation Age of Onset  . COPD Mother   . Arthritis Mother   . Emphysema Mother   . Mental illness Paternal Grandfather   . Heart disease Other     Sibling  . Diabetes Other     Sibling   . Diabetes Brother   . Cancer Brother     prostate  . Cancer Father     malignant brain tumor    Social History:  reports that she has never smoked. She has never used smokeless tobacco. She reports that she does not drink alcohol or use illicit drugs.  Allergies:  Allergies  Allergen Reactions  . Ceftriaxone Sodium     REACTION: hives  . Cephalexin     REACTION: hives  . Clindamycin     REACTION: hives  .  Dilaudid (Hydromorphone Hcl) Itching  . Oxycodone-Acetaminophen     REACTION: hives  . Rocephin (Ceftriaxone Sodium In Dextrose) Hives  . Sulfonamide Derivatives     REACTION: hives    Medications: I have reviewed the patient's current medications.  Results for orders placed during the hospital encounter of 06/25/11 (from the past 48 hour(s))  GLUCOSE, CAPILLARY     Status: Abnormal   Collection Time   06/27/11  5:10 PM      Component Value Range Comment   Glucose-Capillary 147 (*) 70 - 99 (mg/dL)   GLUCOSE, CAPILLARY     Status: Abnormal   Collection Time   06/27/11  9:01 PM      Component Value Range Comment   Glucose-Capillary 182 (*) 70 - 99 (mg/dL)   CARDIAC PANEL(CRET KIN+CKTOT+MB+TROPI)     Status: Normal   Collection Time   06/27/11 10:01 PM      Component Value Range Comment   Total CK 12  7 - 177 (U/L)    CK, MB 1.6  0.3 - 4.0 (ng/mL)     Troponin I <0.30  <0.30 (ng/mL)    Relative Index RELATIVE INDEX IS INVALID  0.0 - 2.5    DIFFERENTIAL     Status: Abnormal   Collection Time   06/28/11  5:40 AM      Component Value Range Comment   Neutrophils Relative 63  43 - 77 (%)    Lymphocytes Relative 19  12 - 46 (%)    Monocytes Relative 10  3 - 12 (%)    Eosinophils Relative 7 (*) 0 - 5 (%)    Basophils Relative 1  0 - 1 (%)    Neutro Abs 3.1  1.7 - 7.7 (K/uL)    Lymphs Abs 0.9  0.7 - 4.0 (K/uL)    Monocytes Absolute 0.5  0.1 - 1.0 (K/uL)    Eosinophils Absolute 0.3  0.0 - 0.7 (K/uL)    Basophils Absolute 0.1  0.0 - 0.1 (K/uL)    WBC Morphology INCREASED BANDS (>20% BANDS)     CBC     Status: Abnormal   Collection Time   06/28/11  5:40 AM      Component Value Range Comment   WBC 4.9  4.0 - 10.5 (K/uL)    RBC 3.41 (*) 3.87 - 5.11 (MIL/uL)    Hemoglobin 10.2 (*) 12.0 - 15.0 (g/dL)    HCT 95.6 (*) 21.3 - 46.0 (%)    MCV 88.9  78.0 - 100.0 (fL)    MCH 29.9  26.0 - 34.0 (pg)    MCHC 33.7  30.0 - 36.0 (g/dL)    RDW 08.6  57.8 - 46.9 (%)    Platelets 54 (*) 150 - 400 (K/uL)   CARDIAC PANEL(CRET KIN+CKTOT+MB+TROPI)     Status: Normal   Collection Time   06/28/11  5:40 AM      Component Value Range Comment   Total CK 13  7 - 177 (U/L)    CK, MB 1.7  0.3 - 4.0 (ng/mL)    Troponin I <0.30  <0.30 (ng/mL)    Relative Index RELATIVE INDEX IS INVALID  0.0 - 2.5    GLUCOSE, CAPILLARY     Status: Abnormal   Collection Time   06/28/11  7:34 AM      Component Value Range Comment   Glucose-Capillary 116 (*) 70 - 99 (mg/dL)    Comment 1 Notify RN      Comment 2 Documented in  Chart     GLUCOSE, CAPILLARY     Status: Abnormal   Collection Time   06/28/11 12:28 PM      Component Value Range Comment   Glucose-Capillary 138 (*) 70 - 99 (mg/dL)    Comment 1 Notify RN      Comment 2 Documented in Chart     CARDIAC PANEL(CRET KIN+CKTOT+MB+TROPI)     Status: Normal   Collection Time   06/28/11 12:45 PM      Component Value Range Comment     Total CK 12  7 - 177 (U/L)    CK, MB 1.8  0.3 - 4.0 (ng/mL)    Troponin I <0.30  <0.30 (ng/mL)    Relative Index RELATIVE INDEX IS INVALID  0.0 - 2.5    D-DIMER, QUANTITATIVE     Status: Abnormal   Collection Time   06/28/11 12:46 PM      Component Value Range Comment   D-Dimer, Quant 2.88 (*) 0.00 - 0.48 (ug/mL-FEU)   GLUCOSE, CAPILLARY     Status: Abnormal   Collection Time   06/28/11  4:55 PM      Component Value Range Comment   Glucose-Capillary 119 (*) 70 - 99 (mg/dL)    Comment 1 Notify RN      Comment 2 Documented in Chart     GLUCOSE, CAPILLARY     Status: Abnormal   Collection Time   06/28/11  8:44 PM      Component Value Range Comment   Glucose-Capillary 200 (*) 70 - 99 (mg/dL)   DIFFERENTIAL     Status: Abnormal   Collection Time   06/29/11  4:50 AM      Component Value Range Comment   Neutrophils Relative 72  43 - 77 (%)    Lymphocytes Relative 13  12 - 46 (%)    Monocytes Relative 8  3 - 12 (%)    Eosinophils Relative 5  0 - 5 (%)    Basophils Relative 2 (*) 0 - 1 (%)    Neutro Abs 6.0  1.7 - 7.7 (K/uL)    Lymphs Abs 1.1  0.7 - 4.0 (K/uL)    Monocytes Absolute 0.7  0.1 - 1.0 (K/uL)    Eosinophils Absolute 0.4  0.0 - 0.7 (K/uL)    Basophils Absolute 0.2 (*) 0.0 - 0.1 (K/uL)    RBC Morphology POLYCHROMASIA PRESENT      WBC Morphology TOXIC GRANULATION     CBC     Status: Abnormal   Collection Time   06/29/11  4:50 AM      Component Value Range Comment   WBC 8.4  4.0 - 10.5 (K/uL)    RBC 3.53 (*) 3.87 - 5.11 (MIL/uL)    Hemoglobin 10.4 (*) 12.0 - 15.0 (g/dL)    HCT 16.1 (*) 09.6 - 46.0 (%)    MCV 89.0  78.0 - 100.0 (fL)    MCH 29.5  26.0 - 34.0 (pg)    MCHC 33.1  30.0 - 36.0 (g/dL)    RDW 04.5  40.9 - 81.1 (%)    Platelets 87 (*) 150 - 400 (K/uL)   GLUCOSE, CAPILLARY     Status: Abnormal   Collection Time   06/29/11  7:54 AM      Component Value Range Comment   Glucose-Capillary 137 (*) 70 - 99 (mg/dL)   GLUCOSE, CAPILLARY     Status: Abnormal    Collection Time   06/29/11 11:51 AM  Component Value Range Comment   Glucose-Capillary 135 (*) 70 - 99 (mg/dL)     Ct Angio Chest W/cm &/or Wo Cm  06/28/2011  *RADIOLOGY REPORT*  Clinical Data: Evaluate for pulmonary embolism and chest wall abscess.  History of breast cancer.  CT ANGIOGRAPHY CHEST  Technique:  Multidetector CT imaging of the chest using the standard protocol during bolus administration of intravenous contrast. Multiplanar reconstructed images including MIPs were obtained and reviewed to evaluate the vascular anatomy.  Contrast: OMNIPAQUE IOHEXOL 300 MG/ML  SOLN  Comparison: Chest CT 04/09/2011  Findings: The study is negative for pulmonary embolism.  Right subclavian Port-A-Cath with the tip in the SVC.  Stable low density structure in the left hepatic lobe probably represents a hepatic cyst.  There is no evidence for a chest lymphadenopathy.  The patient has had a left mastectomy.  There is a very small fluid collection in the left axilla adjacent to surgical clips.  This roughly measures 1.1 cm in greatest diameter.  There is a larger fluid collection along the anterior left chest wall.  The collection extends the length of the chest and measures 2.2 cm in thickness. This fluid collection is probably peripherally enhancing and there is inflammation in the adjacent soft tissues.  This of fluid collection is new since the prior examination.  No acute bony abnormality.  The trachea and mainstem bronchi are patent.  Lungs are clear. Again noted are a few punctate densities along the right minor fissure.  IMPRESSION: Negative for pulmonary embolism.  There is a large fluid collection along the anterior left chest wall with peripheral high density and inflammation in the surrounding tissues.  This is consistent with an abscess based on the history.  There is also a very small fluid collection in the left axilla.  No acute lung findings.  Original Report Authenticated By: Richarda Overlie, M.D.      ROS Blood pressure 130/71, pulse 84, temperature 98.1 F (36.7 C), temperature source Oral, resp. rate 18, height 5\' 8"  (1.727 m), weight 180 lb 12.4 oz (82 kg), SpO2 96.00%. Physical Exam  Respiratory:      Assessment/Plan: ? Left mastectomy site infection  She is not febrile currently on abx.  I am not convinced that this fluid collection is an abscess.  It is referred to that on radiology report but this is clinical diagnosis that I am not sure of.  I think is reasonable to follow this on abx or even switch eventually to oral abx before pursuing drainage of this collection as it may very well be an expected postop area.  There are not changes on her skin that I think pursuing drainage at this time would be best plan.  I will let Dr. Jamey Ripa know she is here and he will follow up.  Therman Hughlett 06/29/2011, 2:05 PM

## 2011-06-29 NOTE — Progress Notes (Signed)
PROGRESS NOTE  [name] ZOX:[id number] DOB: 24-May-1940 DOA: 06/25/2011 PCP: Hailey Mcalpine, MD, MD Oncologist: Dr. Drue [name]  Brief narrative: [name] is a 71 year old female with a PMH of stage IIIA invasive lobular carcinoma of the breast s/p left mastectomy with axillary lymph node dissection on 03/23/11 complicated by an open wound post-operatively who was admitted on 06/25/11 with fever to 103.2 and neutropenia.  Developed substernal chest pain on 06/27/11 necessitating upgrade to tele bed, but follow up imaging shows a left anterior chest wall abscess.  Surgery has been consulted.  Assessment/Plan: Principal Problem:  *Neutropenic fever due to left chest wall cellulitis and abscess  Admitted and placed on broad spectrum antibiotics consisting of Vancomycin and Imipenem, narrowed to Levaquin on 06/27/11 with initial CXR negative for PNA, U/A not consistent with UTI, and no obvious erythema or wound infection noted to left mastectomy site.  Some erythema noted to chest wall skin 06/28/11, after antibiotics narrowed, so Vancomycin resumed 06/28/11.  BCUX and UCUX negative to date.  Developed chest pain on 06/27/11, work up ultimately revealed a left chest wall abscess.  General surgery consulted.  Neupogen given until WBC recovered. Active Problems:  Chest pain  Developed acute onset chest pain 06/27/11 triggering a rapid response evaluation.  12 lead EKG without ST elevation, cardiac markers negative x 2 sets.    Chest pain resolved with NTG (had also been given Maalox without initial relief), pain medication.  Given h/o malignancy, we ruled out PE.  CT chest did show a left chest wall abscess, likely the source of her pain.  DIABETES MELLITUS, uncontrolled  Hemoglobin A1c 8.6%, CBGs 116-200.  Glucophage and Januvia on hold.  Currently on Lantus 15 units Q HS and moderate scale SSI.  Breast cancer, ILC, left, receptor +, her 2 neg  Completed adjuvant chemotherapy  with [facility] 06/18/11.  Single agent taxol planned weekly x 12 weeks to begin 07/09/11.  Seen by [name] 06/27/11; F/U with her as outpatient.  Pancytopenia / thrombocytopenia  Secondary to chemotherapy.  WBC recovered s/p Neupogen; Other counts recovering as well.  Hyponatremia  Resolved with IVF.  Hypertension  Norvasc and Lotensin on hold.  Hypercholesterolemia  Continue Lipitor.  Anxiety / depression  Continue Zoloft.  GERD  Continue PPI therapy.  Thrush  Start diflucan.  Code Status: Full Family Communication: None at bedside. Disposition Plan: Home when stable.  Medical Consultants:  Dr. Drue [name], Oncology  Dr. Dwain [name], Surgery  Other consultants:  None  Antibiotics:  Vancomycin 06/25/11--->06/27/11, resumed 06/28/11--->  Primaxin 06/25/11--->06/27/11  Levaquin 06/27/11--->  Acyclovir 06/25/11--->   Subjective  [name] is complaining of soreness of the left chest wall and axilla.  No fever or chills.  Feels "washed out".  No nausea or vomiting.  No diarrhea.   Objective    Interim History: Stable overnight.   Objective: Filed Vitals:   06/27/11 2318 06/28/11 0546 06/28/11 1455 06/28/11 2041  BP: 129/66 130/72 137/78 148/76  Pulse: 78 80 92 95  Temp: 98.8 F (37.1 C) 98.2 F (36.8 C) 98.6 F (37 C) 98.6 F (37 C)  TempSrc: Oral Oral Oral Oral  Resp: 18 20 18 19   Height: 5\' 8"  (1.727 m)     Weight: 82 kg (180 lb 12.4 oz)     SpO2: 100% 97% 98% 98%    Intake/Output Summary (Last 24 hours) at 06/29/11 1056 Last data filed at 06/29/11 0955  Gross per 24 hour  Intake   4063 ml  Output   1950 ml  Net   2113 ml    Exam: Gen:  NAD Oropharynx: No lesions or thrush. Cardiovascular:  RRR, No M/R/G Chest/skin: Port-A-Cath site without local signs of infection. Left breast wound well healed.  New skin erythema left chest wall. Respiratory: Lungs CTAB Gastrointestinal: Abdomen soft, NT/ND with normal active bowel  sounds. Extremities: No C/E/C    Data Reviewed: Basic Metabolic Panel:  Lab 06/26/11 1610 06/25/11 1642 06/25/11 0920  NA 137 132* 141  K 3.7 3.7 --  CL 101 97 103  CO2 26 25 29   GLUCOSE 218* 203* 237*  BUN 8 11 11   CREATININE 0.54 0.51 0.64  CALCIUM 8.8 8.7 9.4  MG 1.6 -- --  PHOS 2.8 -- --   GFR Estimated Creatinine Clearance: 73.4 ml/min (by C-G formula based on Cr of 0.54). Liver Function Tests:  Lab 06/26/11 0510 06/25/11 1642 06/25/11 0920  AST 12 21 17   ALT 17 21 20   ALKPHOS 85 99 96  BILITOT 0.6 0.4 0.8  PROT 5.7* 5.9* 5.8*  ALBUMIN 2.9* 3.2* 3.9   CBC:  Lab 06/29/11 0450 06/28/11 0540 06/27/11 0505 06/26/11 0510 06/25/11 1642  WBC 8.4 4.9 1.2* 0.8* 0.6*  NEUTROABS 6.0 3.1 0.4* -- 0*  HGB 10.4* 10.2* 9.9* 10.2* 11.0*  HCT 31.4* 30.3* 29.6* 31.1* 33.1*  MCV 89.0 88.9 89.2 89.1 89.0  PLT 87* 54* 51* 46* 56*   Cardiac Enzymes:  Lab 06/28/11 1245 06/28/11 0540 06/27/11 2201  CKTOTAL 12 13 12   CKMB 1.8 1.7 1.6  CKMBINDEX -- -- --  TROPONINI <0.30 <0.30 <0.30   CBG:  Lab 06/29/11 0754 06/28/11 2044 06/28/11 1655 06/28/11 1228 06/28/11 0734  GLUCAP 137* 200* 119* 138* 116*   Microbiology Recent Results (from the past 240 hour(s))  CULTURE, BLOOD (ROUTINE X 2)     Status: Normal (Preliminary result)   Collection Time   06/25/11  4:42 PM      Component Value Range Status Comment   Specimen Description BLOOD PORTA CATH   Final    Special Requests BOTTLES DRAWN AEROBIC AND ANAEROBIC 10CC   Final    Culture  Setup Time [id number]   Final    Culture     Final    Value:        BLOOD CULTURE RECEIVED NO GROWTH TO DATE CULTURE WILL BE HELD FOR 5 DAYS BEFORE ISSUING A FINAL NEGATIVE REPORT   Report Status PENDING   Incomplete   CULTURE, BLOOD (ROUTINE X 2)     Status: Normal (Preliminary result)   Collection Time   06/25/11  4:50 PM      Component Value Range Status Comment   Specimen Description BLOOD RIGHT ARM   Final    Special Requests BOTTLES DRAWN  AEROBIC AND ANAEROBIC 5CC   Final    Culture  Setup Time [id number]   Final    Culture     Final    Value:        BLOOD CULTURE RECEIVED NO GROWTH TO DATE CULTURE WILL BE HELD FOR 5 DAYS BEFORE ISSUING A FINAL NEGATIVE REPORT   Report Status PENDING   Incomplete   URINE CULTURE     Status: Normal   Collection Time   06/25/11  8:40 PM      Component Value Range Status Comment   Specimen Description URINE, CLEAN CATCH   Final    Special Requests Immunocompromised   Final    Culture  Setup Time  [id number]   Final    Colony Count NO GROWTH   Final    Culture NO GROWTH   Final    Report Status 06/26/2011 FINAL   Final   MRSA PCR SCREENING     Status: Normal   Collection Time   06/25/11 11:41 PM      Component Value Range Status Comment   MRSA by PCR NEGATIVE  NEGATIVE  Final       Procedures and Diagnostic Studies:  Dg Chest 1 View (portable) 06/25/2011 IMPRESSION: No active cardiopulmonary disease.  Original Report Authenticated By: Cyndie Chime, M.D.    Ct Angio Chest W/cm &/or Wo Cm 06/28/2011 IMPRESSION: Negative for pulmonary embolism.  There is a large fluid collection along the anterior left chest wall with peripheral high density and inflammation in the surrounding tissues.  This is consistent with an abscess based on the history.  There is also a very small fluid collection in the left axilla.  No acute lung findings.  Original Report Authenticated By: Richarda Overlie, M.D.   Scheduled Meds:    . acyclovir  400 mg Oral Q6H  . atorvastatin  40 mg Oral Daily  . docusate sodium  100 mg Oral BID  . insulin aspart  0-15 Units Subcutaneous TID WC  . insulin aspart  0-5 Units Subcutaneous QHS  . insulin glargine  15 Units Subcutaneous QHS  . lactose free nutrition  237 mL Oral QHS  . levofloxacin  750 mg Oral Daily  . pantoprazole  40 mg Oral Q1200  . sertraline  50 mg Oral Daily  . sodium chloride  3 mL Intravenous Q12H  . vancomycin  1,000 mg Intravenous Q12H  . DISCONTD:  filgrastim (NEUPOGEN)  SQ  300 mcg Subcutaneous q1800  . DISCONTD: vancomycin  1,000 mg Intravenous Q48H   Continuous Infusions:    . sodium chloride 100 mL/hr at 06/29/11 0954      LOS: 4 days   Hillery Aldo, MD Pager [phone]  06/29/2011, 10:56 AM

## 2011-06-30 ENCOUNTER — Other Ambulatory Visit: Payer: Medicare Other

## 2011-06-30 ENCOUNTER — Ambulatory Visit: Payer: Medicare Other | Admitting: Oncology

## 2011-06-30 DIAGNOSIS — E118 Type 2 diabetes mellitus with unspecified complications: Secondary | ICD-10-CM

## 2011-06-30 DIAGNOSIS — E1165 Type 2 diabetes mellitus with hyperglycemia: Secondary | ICD-10-CM

## 2011-06-30 DIAGNOSIS — R079 Chest pain, unspecified: Secondary | ICD-10-CM

## 2011-06-30 DIAGNOSIS — L03319 Cellulitis of trunk, unspecified: Secondary | ICD-10-CM

## 2011-06-30 DIAGNOSIS — C50919 Malignant neoplasm of unspecified site of unspecified female breast: Secondary | ICD-10-CM

## 2011-06-30 DIAGNOSIS — L02219 Cutaneous abscess of trunk, unspecified: Secondary | ICD-10-CM

## 2011-06-30 LAB — GLUCOSE, CAPILLARY
Glucose-Capillary: 165 mg/dL — ABNORMAL HIGH (ref 70–99)
Glucose-Capillary: 126 mg/dL — ABNORMAL HIGH (ref 70–99)
Glucose-Capillary: 160 mg/dL — ABNORMAL HIGH (ref 70–99)
Glucose-Capillary: 190 mg/dL — ABNORMAL HIGH (ref 70–99)

## 2011-06-30 LAB — CBC
MCHC: 33.4 g/dL (ref 30.0–36.0)
Platelets: 118 10*3/uL — ABNORMAL LOW (ref 150–400)
RDW: 15.4 % (ref 11.5–15.5)
WBC: 8.9 10*3/uL (ref 4.0–10.5)

## 2011-06-30 NOTE — Progress Notes (Signed)
Patient ID: [name], female   DOB: 1940-07-15, 71 y.o.   MRN: [id number]  Interim Summary: 71 year old female with a PMH of stage IIIA invasive lobular carcinoma of the breast s/p left mastectomy with axillary lymph node dissection on 03/23/11 complicated by an open wound post-operatively who was admitted on 06/25/11 with fever to 103.2 and neutropenia. Hospital course complicated by the development of substernal chest pain on 06/27/11 necessitating upgrade to tele bed, but follow up imaging showed a left anterior chest wall abscess.  Assessment/Plan:   Principal Problem:   *Neutropenic fever due to left chest wall cellulitis and abscess  Admitted and placed on broad spectrum antibiotics consisting of Vancomycin and Imipenem, narrowed to Levaquin on 06/27/11 with initial CXR negative for PNA, U/A not consistent with UTI, and no obvious erythema or wound infection noted to left mastectomy site.  Some erythema noted to chest wall skin 06/28/11, after antibiotics narrowed, so Vancomycin resumed 06/28/11.  BCUX and UCUX negative to date.  Developed chest pain on 06/27/11, work up ultimately revealed a left chest wall abscess.  Appreciate surgery following Neupogen given until WBC recovered.  Active Problems:   Chest pain  Developed acute onset chest pain 06/27/11 triggering a rapid response evaluation.  12 lead EKG without ST elevation, cardiac markers negative x 2 sets.  Chest pain resolved with NTG  And pain medication.  Given h/o malignancy, we ruled out PE. CT chest did show a left chest wall abscess, likely the source of her pain.  DIABETES MELLITUS, uncontrolled with complications Hemoglobin A1c 8.6% Glucophage and Januvia on hold.  Currently on Lantus 15 units Q HS and moderate scale SSI.  Breast cancer, ILC, left, receptor +, her 2 neg  Completed adjuvant chemotherapy with [facility] 06/18/11.  Single agent taxol planned weekly x 12 weeks to begin 07/09/11.  Seen by Dr. Welton Flakes 06/27/11; F/U  with her as outpatient.  Pancytopenia / thrombocytopenia  Secondary to chemotherapy.  WBC recovered s/p Neupogen; Other counts recovering as well.  Hyponatremia  Resolved with IVF.  Hypertension  Norvasc and Lotensin to be resumed today BP 148/79  Hypercholesterolemia  Continue Lipitor.  Anxiety / depression  Continue Zoloft.  GERD  Continue PPI therapy.  Thrush  Start diflucan.  Code Status: Full   Family Communication: None at bedside.   Disposition Plan: Home when stable.   Medical Consultants:  Dr. Drue Second, Oncology  Dr. Dwain Sarna, Surgery  Other consultants:  None  Antibiotics:  Vancomycin 06/25/11--->06/27/11, resumed 06/28/11--->  Primaxin 06/25/11--->06/27/11  Levaquin 06/27/11--->  Acyclovir 06/25/11--->  Subjective: No events overnight. Patient denies chest pain, shortness of breath, abdominal pain. Had bowel movement and reports ambulating.  Objective:  Vital signs in last 24 hours:  Filed Vitals:   06/29/11 1340 06/29/11 2144 06/30/11 0610 06/30/11 1415  BP: 130/71 159/79 135/72 148/79  Pulse: 84 84 89 89  Temp: 98.1 F (36.7 C) 98.2 F (36.8 C) 98.2 F (36.8 C) 99 F (37.2 C)  TempSrc: Oral Oral Oral Oral  Resp: 18 18 18 18   Height:      Weight:      SpO2: 96% 95% 93% 97%    Intake/Output from previous day:  Intake/Output Summary (Last 24 hours) at 06/30/11 1601 Last data filed at 06/30/11 1420  Gross per 24 hour  Intake   1810 ml  Output    902 ml  Net    908 ml    Physical Exam: General: Alert, awake, oriented x3, in no  acute distress. HEENT: No bruits, no goiter. Moist mucous membranes, no scleral icterus, no conjunctival pallor. Heart: Regular rate and rhythm, S1/S2 +, no murmurs, rubs, gallops; mastectomy scar, left chest Lungs: Clear to auscultation bilaterally. No wheezing, no rhonchi, no rales.  Abdomen: Soft, nontender, nondistended, positive bowel sounds. Extremities: No clubbing or cyanosis, no pitting edema,   positive pedal pulses. Neuro: Grossly nonfocal.  Lab Results:  Lab 06/30/11 0520 06/29/11 0450 06/28/11 0540 06/27/11 0505 06/26/11 0510 06/25/11 1642  WBC 8.9 8.4 4.9 1.2* 0.8* --  HGB 10.2* 10.4* 10.2* 9.9* 10.2* --  HCT 30.5* 31.4* 30.3* 29.6* 31.1* --  PLT 118* 87* 54* 51* 46* --  MCV 88.4 89.0 88.9 89.2 89.1 --    Lab 06/26/11 0510 06/25/11 1642 06/25/11 0920  NA 137 132* 141  K 3.7 3.7 4.1  CL 101 97 103  CO2 26 25 29   GLUCOSE 218* 203* 237*  BUN 8 11 11   CREATININE 0.54 0.51 0.64  CALCIUM 8.8 8.7 9.4  MG 1.6 -- --    Lab 06/28/11 1245 06/28/11 0540 06/27/11 2201  CKMB 1.8 1.7 1.6  TROPONINI <0.30 <0.30 <0.30    CULTURE, BLOOD (ROUTINE X 2)     Status: Normal (Preliminary result)   Collection Time   06/25/11  4:42 PM      Component Value Range Status Comment   Culture     Final    Value:        BLOOD CULTURE RECEIVED NO GROWTH TO DATE    Report Status PENDING   Incomplete   CULTURE, BLOOD (ROUTINE X 2)     Status: Normal (Preliminary result)   Collection Time   06/25/11  4:50 PM      Component Value Range Status Comment   Culture     Final    Value:        BLOOD CULTURE RECEIVED NO GROWTH TO DATE    Report Status PENDING   Incomplete   URINE CULTURE     Status: Normal   Collection Time   06/25/11  8:40 PM      Component Value Range Status Comment   Specimen Description URINE  Final    Special Requests Immunocomp  Final    Culture  Setup Time [id number]   Final    Colony Count NO GROWTH   Final    Culture NO GROWTH   Final    Report Status 06/26/2011 FINAL   Final   MRSA PCR SCREENING     Status: Normal   Collection Time   06/25/11 11:41 PM      Component Value Range Status Comment   MRSA by PCR NEGATIVE  NEGATIVE  Final    Medications: Scheduled Meds:   . acyclovir  400 mg Oral Q6H  . atorvastatin  40 mg Oral Daily  . docusate sodium  100 mg Oral BID  . fluconazole  100 mg Oral Daily  . insulin aspart  0-15 Units Subcutaneous TID WC  . insulin  aspart  0-5 Units Subcutaneous QHS  . insulin glargine  15 Units Subcutaneous QHS  . lactose free nutrition  237 mL Oral QHS  . levofloxacin  750 mg Oral Daily  . pantoprazole  40 mg Oral Q1200  . sertraline  50 mg Oral Daily  . sodium chloride  3 mL Intravenous Q12H  . vancomycin  1,000 mg Intravenous Q12H   Continuous Infusions:   . sodium chloride 100 mL/hr at 06/29/11 9562  PRN Meds:.acetaminophen, acetaminophen, albuterol, alum & mag hydroxide-simeth, guaiFENesin-dextromethorphan, HYDROcodone-acetaminophen, LORazepam, magic mouthwash w/lidocaine, nitroGLYCERIN, ondansetron (ZOFRAN) IV, ondansetron, prochlorperazine, sodium chloride, zolpidem    LOS: 5 days   Keyondre Hepburn 06/30/2011, 4:01 PM  [facility] Pager: [phone]

## 2011-06-30 NOTE — Care Management Note (Addendum)
    Page 1 of 1   07/03/2011     9:41:48 AM   CARE MANAGEMENT NOTE 07/03/2011  Patient:  [name], [name]   Account Number:  [id number]  Date Initiated:  06/26/2011  Documentation initiated by:  [name],Hailey  Subjective/Objective Assessment:   pt with new found ca and new start of chemo temp of 103 at home and arrival, admitted into sdu wiht poss sepsis     Action/Plan:   lives at home   Anticipated DC Date:  07/02/2011   Anticipated DC Plan:  HOME W HOME HEALTH SERVICES  In-house referral  NA      DC Planning Services  CM consult      [facility] Choice  NA   Choice offered to / List presented to:  C-1 Patient   DME arranged  BEDSIDE [facility]      DME agency  [facility]     HH arranged  HH-1 RN  HH-2 PT      [facility] agency  [facility]   Status of service:  Completed, signed off Medicare Important Message given?  YES (If response is "NO", the following Medicare IM given date fields will be blank) Date Medicare IM given:  06/25/2011 Date Additional Medicare IM given:    Discharge Disposition:  HOME W HOME HEALTH SERVICES  Per UR Regulation:  Reviewed for med. necessity/level of care/duration of stay  If discussed at Long Length of Stay Meetings, dates discussed:   07/01/2011    Comments:  07/03/11 Hailey Hannay RN,BSN NCM [phone] F/U W/[facility]([name])-THEY HAVE SPOKEN TO [name] & EXPLAINED THAT THEY WILL MAKE DAILY VISITS AS LONG AS THE INSURANCE COMPANY WILL PAY,& UNTIL THE TEACHING HAS BEEN COMPLETED.ALSO TC TO [name]  BOTH PARTIES ARE SATISFIED W/OUTCOME.  RECEIVED CALL FROM NURSE ON FLOOR ABOUT PATIENT CONCERNS W/[facility]-NURSE SAYING THAT THEY WILL NOT MAKE DAILY VISITS. I CALLED [facility]#[phone] SPOKE TO [name]-INTAKE COORDIN ABOUT CONCERNS SHE SAID HAVE THE PATIENT TO CALL HER & THEY WILL TAKE IT FROM HERE,THEIR POLICY IS TO  MAKE DAILY VISITS IF NEED IN ORDER TO TEACH THE PATIENT.I CONTACTED THE  PATIENT & PROVIDED HER THE TEL#,& CONTACT PERSON,SHE WAS GREATLY APPRECIATIVE,BUT SAID THE NURSE THAT CAME SAID THEY CAN'T MAKE DAILY VISITS SINCE THE INSURANCE COMPANY WILL NOT PAY.I REASSURED THE PATIENT THAT THEY EXPLAIN THEIR POLICY & RESOLVE THE ISSUE.UPDATED CHARGE NURSE-[name],& DR. Elisabeth Pigeon. 07/02/11 Hailey Mcmanaway RN,BSN NCM [phone] [facility] ABLE TO PROVIDE HHRN/PT,FAXED ALL INFO.[facility] DME FOR 3N1.D/C HOME. SPOKE TO PATIENT ABOUT HH.WANTS [facility],THEY WILL CALL ME BACK IF THEY CAN STAFF.IF NOT THEN [facility] WILL BE CHOSEN FOR HHRN-DSG CHANGES/PT  07/01/11 Hailey Marasco RN,BSN NCM [phone] SX TODAY-ABSCESS DRAIN.?WOUND VAC.IF HH NEEDED CAN ARRANGE.  06/30/11 Hailey Yono RN,BSN NCM [phone] SX FOLLOWING.  [id number]/OZHYQM Davis, RN, BSN, CCM No discharge needs present at time of this review at the sdu/icu level. Case Management [phone]

## 2011-06-30 NOTE — Progress Notes (Signed)
Neutropenic fever  Subjective: Feels better today. NO more shaking chills  Objective: Vital signs in last 24 hours: Temp:  [98.1 F (36.7 C)-98.2 F (36.8 C)] 98.2 F (36.8 C) (05/28 0610) Pulse Rate:  [84-89] 89  (05/28 0610) Resp:  [18] 18  (05/28 0610) BP: (130-159)/(71-79) 135/72 mmHg (05/28 0610) SpO2:  [93 %-96 %] 93 % (05/28 0610) Last BM Date: 06/28/11  Intake/Output from previous day: 05/27 0701 - 05/28 0700 In: 2383 [P.O.:980; I.V.:1203; IV Piggyback:200] Out: 700 [Urine:700] Intake/Output this shift: Total I/O In: 240 [P.O.:240] Out: 400 [Urine:400]  General appearance: alert, cooperative and no distress Skin: Skin color, texture, turgor normal. No rashes or lesions or There is fluid under the mastectomy scar, not tender however and may represent a chronic seroma.  Lab Results:  Results for orders placed during the hospital encounter of 06/25/11 (from the past 24 hour(s))  GLUCOSE, CAPILLARY     Status: Abnormal   Collection Time   06/29/11 11:51 AM      Component Value Range   Glucose-Capillary 135 (*) 70 - 99 (mg/dL)  GLUCOSE, CAPILLARY     Status: Abnormal   Collection Time   06/29/11  5:12 PM      Component Value Range   Glucose-Capillary 104 (*) 70 - 99 (mg/dL)  GLUCOSE, CAPILLARY     Status: Abnormal   Collection Time   06/29/11  9:42 PM      Component Value Range   Glucose-Capillary 165 (*) 70 - 99 (mg/dL)  CBC     Status: Abnormal   Collection Time   06/30/11  5:20 AM      Component Value Range   WBC 8.9  4.0 - 10.5 (K/uL)   RBC 3.45 (*) 3.87 - 5.11 (MIL/uL)   Hemoglobin 10.2 (*) 12.0 - 15.0 (g/dL)   HCT 16.1 (*) 09.6 - 46.0 (%)   MCV 88.4  78.0 - 100.0 (fL)   MCH 29.6  26.0 - 34.0 (pg)   MCHC 33.4  30.0 - 36.0 (g/dL)   RDW 04.5  40.9 - 81.1 (%)   Platelets 118 (*) 150 - 400 (K/uL)  GLUCOSE, CAPILLARY     Status: Abnormal   Collection Time   06/30/11  7:24 AM      Component Value Range   Glucose-Capillary 126 (*) 70 - 99 (mg/dL)   Comment  1 Notify RN     Comment 2 Documented in Chart       Studies/Results Radiology     MEDS, Scheduled    . acyclovir  400 mg Oral Q6H  . atorvastatin  40 mg Oral Daily  . docusate sodium  100 mg Oral BID  . fluconazole  100 mg Oral Daily  . insulin aspart  0-15 Units Subcutaneous TID WC  . insulin aspart  0-5 Units Subcutaneous QHS  . insulin glargine  15 Units Subcutaneous QHS  . lactose free nutrition  237 mL Oral QHS  . levofloxacin  750 mg Oral Daily  . pantoprazole  40 mg Oral Q1200  . sertraline  50 mg Oral Daily  . sodium chloride  3 mL Intravenous Q12H  . vancomycin  1,000 mg Intravenous Q12H     Assessment: Neutropenic fever Seroma or abscess at mastectomy site. After the medial area finally closed over, she has accumulated fluid under the flaps. Can't tell if infected, but given the need for chemo it well could become infected and I think needs to be drained  Plan: Discussed with the  patient. Will plan to take to OR tomorrow and open the wound and leave open. She may subsequently benefit from a vac  LOS: 5 days    Currie Paris, MD, [facility] Surgery, [place] [phone]   06/30/2011 8:32 AM

## 2011-07-01 ENCOUNTER — Encounter (HOSPITAL_COMMUNITY)
Admission: EM | Disposition: A | Discharge status: Home / Self Care | Payer: Self-pay | Source: Home / Self Care | Attending: Internal Medicine

## 2011-07-01 ENCOUNTER — Inpatient Hospital Stay (HOSPITAL_COMMUNITY): Payer: Medicare Other | Admitting: Anesthesiology

## 2011-07-01 ENCOUNTER — Encounter (HOSPITAL_COMMUNITY): Payer: Self-pay | Admitting: Anesthesiology

## 2011-07-01 DIAGNOSIS — IMO0002 Reserved for concepts with insufficient information to code with codable children: Secondary | ICD-10-CM | POA: Diagnosis present

## 2011-07-01 HISTORY — PX: EVACUATION BREAST HEMATOMA: SHX1537

## 2011-07-01 LAB — CREATININE, SERUM: Creatinine, Ser: 0.54 mg/dL (ref 0.50–1.10)

## 2011-07-01 LAB — GLUCOSE, CAPILLARY
Glucose-Capillary: 125 mg/dL — ABNORMAL HIGH (ref 70–99)
Glucose-Capillary: 129 mg/dL — ABNORMAL HIGH (ref 70–99)
Glucose-Capillary: 174 mg/dL — ABNORMAL HIGH (ref 70–99)

## 2011-07-01 LAB — VANCOMYCIN, TROUGH: Vancomycin Tr: 12.4 ug/mL (ref 10.0–20.0)

## 2011-07-01 SURGERY — EVACUATION, HEMATOMA, BREAST
Anesthesia: General | Laterality: Left | Wound class: Clean Contaminated

## 2011-07-01 MED ORDER — 0.9 % SODIUM CHLORIDE (POUR BTL) OPTIME
TOPICAL | Status: DC | PRN
  Administered 2011-07-01: 1000 mL

## 2011-07-01 MED ORDER — FENTANYL CITRATE 0.05 MG/ML IJ SOLN: 25.0000 ug | INTRAMUSCULAR | Status: DC | PRN

## 2011-07-01 MED ORDER — LACTATED RINGERS IV SOLN
INTRAVENOUS | Status: DC | PRN
  Administered 2011-07-01: 18:00:00 via INTRAVENOUS

## 2011-07-01 MED ORDER — MIDAZOLAM HCL 5 MG/5ML IJ SOLN
INTRAMUSCULAR | Status: DC | PRN
  Administered 2011-07-01 (×2): 2 mg via INTRAVENOUS

## 2011-07-01 MED ORDER — ACETAMINOPHEN 10 MG/ML IV SOLN
INTRAVENOUS | Status: DC | PRN
  Administered 2011-07-01: 1000 mg via INTRAVENOUS

## 2011-07-01 MED ORDER — PROMETHAZINE HCL 25 MG/ML IJ SOLN: 6.2500 mg | INTRAMUSCULAR | Status: DC | PRN

## 2011-07-01 MED ORDER — LACTATED RINGERS IV SOLN
INTRAVENOUS | Status: DC
Start: 2011-07-01 — End: 2011-07-01
  Administered 2011-07-01: 1000 mL via INTRAVENOUS

## 2011-07-01 MED ORDER — FENTANYL CITRATE 0.05 MG/ML IJ SOLN
INTRAMUSCULAR | Status: DC | PRN
  Administered 2011-07-01: 50 ug via INTRAVENOUS

## 2011-07-01 MED ORDER — ACETAMINOPHEN 10 MG/ML IV SOLN
1000.0000 mg | Freq: Four times a day (QID) | INTRAVENOUS | Status: DC
Start: 2011-07-01 — End: 2011-07-01
  Filled 2011-07-01 (×3): qty 100

## 2011-07-01 MED ORDER — PROPOFOL 10 MG/ML IV EMUL
INTRAVENOUS | Status: DC | PRN
  Administered 2011-07-01: 160 mg via INTRAVENOUS
  Administered 2011-07-01: 40 mg via INTRAVENOUS

## 2011-07-01 SURGICAL SUPPLY — 50 items
APL SKNCLS STERI-STRIP NONHPOA (GAUZE/BANDAGES/DRESSINGS)
APPLIER CLIP 11 MED OPEN (CLIP)
APR CLP MED 11 20 MLT OPN (CLIP)
BENZOIN TINCTURE PRP APPL 2/3 (GAUZE/BANDAGES/DRESSINGS) IMPLANT
BINDER BREAST LRG (GAUZE/BANDAGES/DRESSINGS) ×2 IMPLANT
BLADE HEX COATED 2.75 (ELECTRODE) ×2 IMPLANT
BNDG COHESIVE 1X5 TAN STRL LF (GAUZE/BANDAGES/DRESSINGS) ×1 IMPLANT
CANISTER SUCTION 2500CC (MISCELLANEOUS) ×2 IMPLANT
CLIP APPLIE 11 MED OPEN (CLIP) IMPLANT
CLOTH BEACON ORANGE TIMEOUT ST (SAFETY) ×2 IMPLANT
DISSECTOR ROUND CHERRY 3/8 STR (MISCELLANEOUS) IMPLANT
DRAIN CHANNEL 10F 3/8 F FF (DRAIN) IMPLANT
DRAIN CHANNEL RND F F (WOUND CARE) IMPLANT
DRAPE LAPAROSCOPIC ABDOMINAL (DRAPES) IMPLANT
DRAPE LG THREE QUARTER DISP (DRAPES) ×2 IMPLANT
DRAPE ORTHO 2.5IN SPLIT 77X108 (DRAPES) IMPLANT
DRAPE ORTHO SPLIT 77X108 STRL (DRAPES)
DRSG EMULSION OIL 3X16 NADH (GAUZE/BANDAGES/DRESSINGS) IMPLANT
DRSG PAD ABDOMINAL 8X10 ST (GAUZE/BANDAGES/DRESSINGS) ×1 IMPLANT
ELECT BLADE TIP CTD 4 INCH (ELECTRODE) IMPLANT
ELECT REM PT RETURN 9FT ADLT (ELECTROSURGICAL) ×2
ELECTRODE REM PT RTRN 9FT ADLT (ELECTROSURGICAL) ×1 IMPLANT
EVACUATOR DRAINAGE 10X20 100CC (DRAIN) IMPLANT
EVACUATOR SILICONE 100CC (DRAIN) IMPLANT
GLOVE BIOGEL PI IND STRL 7.0 (GLOVE) ×1 IMPLANT
GLOVE BIOGEL PI INDICATOR 7.0 (GLOVE) ×1
GLOVE EUDERMIC 7 POWDERFREE (GLOVE) ×2 IMPLANT
GOWN STRL NON-REIN LRG LVL3 (GOWN DISPOSABLE) ×2 IMPLANT
GOWN STRL REIN XL XLG (GOWN DISPOSABLE) ×4 IMPLANT
KIT BASIN OR (CUSTOM PROCEDURE TRAY) ×6 IMPLANT
NEEDLE HYPO 22GX1.5 SAFETY (NEEDLE) ×2 IMPLANT
NS IRRIG 1000ML POUR BTL (IV SOLUTION) ×2 IMPLANT
PACK GENERAL/GYN (CUSTOM PROCEDURE TRAY) ×2 IMPLANT
PEN SKIN MARKING BROAD (MISCELLANEOUS) ×2 IMPLANT
SPONGE DRAIN TRACH 4X4 STRL 2S (GAUZE/BANDAGES/DRESSINGS) IMPLANT
SPONGE GAUZE 4X4 12PLY (GAUZE/BANDAGES/DRESSINGS) ×3 IMPLANT
SPONGE LAP 18X18 X RAY DECT (DISPOSABLE) IMPLANT
SPONGE LAP 4X18 X RAY DECT (DISPOSABLE) IMPLANT
STAPLER VISISTAT 35W (STAPLE) IMPLANT
STRIP CLOSURE SKIN 1/2X4 (GAUZE/BANDAGES/DRESSINGS) IMPLANT
SUT ETHILON 2 0 PS N (SUTURE) IMPLANT
SUT ETHILON 3 0 PS 1 (SUTURE) IMPLANT
SUT SILK 2 0 (SUTURE)
SUT SILK 2-0 18XBRD TIE 12 (SUTURE) ×1 IMPLANT
SUT SILK 3 0 (SUTURE)
SUT SILK 3-0 18XBRD TIE 12 (SUTURE) IMPLANT
SUT VIC AB 3-0 SH 18 (SUTURE) IMPLANT
SYR CONTROL 10ML LL (SYRINGE) IMPLANT
TAPE CLOTH SURG 6X10 WHT LF (GAUZE/BANDAGES/DRESSINGS) ×2 IMPLANT
TOWEL OR 17X26 10 PK STRL BLUE (TOWEL DISPOSABLE) ×4 IMPLANT

## 2011-07-01 NOTE — Transfer of Care (Signed)
Immediate Anesthesia Transfer of Care Note  Patient: [name]  Procedure(s) Performed: Procedure(s) (LRB): EVACUATION HEMATOMA BREAST (Left)  Patient Location: PACU  Anesthesia Type: General  Level of Consciousness: awake, sedated and patient cooperative  Airway & Oxygen Therapy: Patient Spontanous Breathing and Patient connected to face mask oxygen  Post-op Assessment: Report given to PACU RN and Post -op Vital signs reviewed and stable  Post vital signs: Reviewed and stable  Complications: No apparent anesthesia complications

## 2011-07-01 NOTE — Progress Notes (Addendum)
ANTIBIOTIC CONSULT NOTE - FOLLOW UP  Pharmacy Consult for Vanco Indication: Cellulitis  Allergies  Allergen Reactions  . Ceftriaxone Sodium     REACTION: hives  . Cephalexin     REACTION: hives  . Clindamycin     REACTION: hives  . Dilaudid (Hydromorphone Hcl) Itching  . Oxycodone-Acetaminophen     REACTION: hives  . Rocephin (Ceftriaxone Sodium In Dextrose) Hives  . Sulfonamide Derivatives     REACTION: hives    Patient Measurements: Height: 5\' 8"  (172.7 cm) Weight: 180 lb 12.4 oz (82 kg) IBW/kg (Calculated) : 63.9   Vital Signs: Temp: 98.8 F (37.1 C) (05/29 0537) Temp src: Oral (05/29 0537) BP: 139/68 mmHg (05/29 0537) Pulse Rate: 73  (05/29 0537) Intake/Output from previous day: 05/28 0701 - 05/29 0700 In: 3360 [P.O.:360; I.V.:2400; IV Piggyback:600] Out: 2202 [Urine:2201; Stool:1] Intake/Output from this shift: Total I/O In: 0  Out: 600 [Urine:600]  Labs:  [facility] 07/01/11 1230 06/30/11 0520 06/29/11 0450  WBC -- 8.9 8.4  HGB -- 10.2* 10.4*  PLT -- 118* 87*  LABCREA -- -- --  CREATININE 0.54 -- --   Estimated Creatinine Clearance: 73.4 ml/min (by C-G formula based on Cr of 0.54).  Basename 07/01/11 1230  VANCOTROUGH 12.4  VANCOPEAK --  VANCORANDOM --  GENTTROUGH --  GENTPEAK --  GENTRANDOM --  TOBRATROUGH --  TOBRAPEAK --  TOBRARND --  AMIKACINPEAK --  AMIKACINTROU --  AMIKACIN --     Microbiology: Recent Results (from the past 720 hour(s))  CULTURE, BLOOD (ROUTINE X 2)     Status: Normal (Preliminary result)   Collection Time   06/25/11  4:42 PM      Component Value Range Status Comment   Specimen Description BLOOD PORTA CATH   Final    Special Requests BOTTLES DRAWN AEROBIC AND ANAEROBIC 10CC   Final    Culture  Setup Time [id number]   Final    Culture     Final    Value:        BLOOD CULTURE RECEIVED NO GROWTH TO DATE CULTURE WILL BE HELD FOR 5 DAYS BEFORE ISSUING A FINAL NEGATIVE REPORT   Report Status PENDING   Incomplete     CULTURE, BLOOD (ROUTINE X 2)     Status: Normal (Preliminary result)   Collection Time   06/25/11  4:50 PM      Component Value Range Status Comment   Specimen Description BLOOD RIGHT ARM   Final    Special Requests BOTTLES DRAWN AEROBIC AND ANAEROBIC 5CC   Final    Culture  Setup Time [id number]   Final    Culture     Final    Value:        BLOOD CULTURE RECEIVED NO GROWTH TO DATE CULTURE WILL BE HELD FOR 5 DAYS BEFORE ISSUING A FINAL NEGATIVE REPORT   Report Status PENDING   Incomplete   URINE CULTURE     Status: Normal   Collection Time   06/25/11  8:40 PM      Component Value Range Status Comment   Specimen Description URINE, CLEAN CATCH   Final    Special Requests Immunocompromised   Final    Culture  Setup Time [id number]   Final    Colony Count NO GROWTH   Final    Culture NO GROWTH   Final    Report Status 06/26/2011 FINAL   Final   MRSA PCR SCREENING     Status: Normal   Collection  Time   06/25/11 11:41 PM      Component Value Range Status Comment   MRSA by PCR NEGATIVE  NEGATIVE  Final     Anti-infectives     Start     Dose/Rate Route Frequency Ordered Stop   06/29/11 1230   fluconazole (DIFLUCAN) tablet 100 mg        100 mg Oral Daily 06/29/11 1118 07/04/11 0959   06/28/11 1300   vancomycin (VANCOCIN) IVPB 1000 mg/200 mL premix        1,000 mg 200 mL/hr over 60 Minutes Intravenous Every 12 hours 06/28/11 1213     06/28/11 1200   vancomycin (VANCOCIN) IVPB 1000 mg/200 mL premix  Status:  Discontinued        1,000 mg 200 mL/hr over 60 Minutes Intravenous Every 48 hours 06/28/11 1155 06/28/11 1206   06/27/11 1500   levofloxacin (LEVAQUIN) tablet 750 mg        750 mg Oral Daily 06/27/11 1427     06/26/11 0600   vancomycin (VANCOCIN) IVPB 1000 mg/200 mL premix  Status:  Discontinued        1,000 mg 200 mL/hr over 60 Minutes Intravenous Every 12 hours 06/25/11 2047 06/27/11 1427   06/26/11 0000   imipenem-cilastatin (PRIMAXIN) 500 mg in sodium chloride 0.9 % 100  mL IVPB  Status:  Discontinued        500 mg 200 mL/hr over 30 Minutes Intravenous 4 times per day 06/25/11 2047 06/27/11 1427   06/25/11 2200   acyclovir (ZOVIRAX) 200 MG capsule 400 mg        400 mg Oral Every 6 hours 06/25/11 2017     06/25/11 1830  imipenem-cilastatin (PRIMAXIN) 500 mg in sodium chloride 0.9 % 100 mL IVPB       500 mg 200 mL/hr over 30 Minutes Intravenous  Once 06/25/11 1655 06/25/11 1925   06/25/11 1730   vancomycin (VANCOCIN) IVPB 1000 mg/200 mL premix        1,000 mg 200 mL/hr over 60 Minutes Intravenous  Once 06/25/11 1655 06/25/11 1830          Assessment: 71 yo F admitted 5/23 with neutropenic fever. Vanco had been d/c'd 5/25, but then ordered to resume 5/26 for cellulitis. Today is Day #4 of Vanco restart (Day # 7 total Vanco) plus Day #5 Levaquin. SCr stable and wnl. Vanco trough in goal range for cellulitis indication.  Goal of Therapy:  Vancomycin trough level 10-15 mcg/ml  Plan:  1) Continue current Vanco regimen 1g q12h 2) Await decision re: length of abx therapy.  Darrol Angel, PharmD Pager: [phone] 07/01/2011,1:18 PM

## 2011-07-01 NOTE — Progress Notes (Signed)
Patient ID: [name], female   DOB: September 14, 1940, 71 y.o.   MRN: [id number]  Assessment/Plan:   Principal Problem:   *Neutropenic fever due to left chest wall cellulitis and abscess  Admitted and placed on broad spectrum antibiotics consisting of Vancomycin and Imipenem, narrowed to Levaquin on 06/27/11 with initial CXR negative for PNA, U/A not consistent with UTI, and no obvious erythema or wound infection noted to left mastectomy site.  Some erythema noted to chest wall skin 06/28/11, after antibiotics narrowed, so Vancomycin resumed 06/28/11.  BCUX and UCUX negative to date.  Developed chest pain on 06/27/11, work up ultimately revealed a left chest wall abscess.  Appreciate surgery following - surgery plan for today Neupogen given until WBC recovered.  Active Problems:   Chest pain  Developed acute onset chest pain 06/27/11 triggering a rapid response evaluation.  12 lead EKG without ST elevation, cardiac markers negative x 2 sets.  Chest pain resolved with NTG And pain medication.  Given h/o malignancy, we ruled out PE. CT chest did show a left chest wall abscess, likely the source of her pain.  DIABETES MELLITUS, uncontrolled with complications  Hemoglobin A1c 8.6%  Glucophage and Januvia on hold.  Currently on Lantus 15 units Q HS and moderate scale SSI.  Breast cancer, ILC, left, receptor +, her 2 neg  Completed adjuvant chemotherapy with FEC 06/18/11.  Single agent taxol planned weekly x 12 weeks to begin 07/09/11.  Seen by Dr. Welton Flakes 06/27/11; F/U with her as outpatient.  Pancytopenia / thrombocytopenia  Secondary to chemotherapy.  WBC recovered s/p Neupogen; Other counts recovering as well.  Hyponatremia  Resolved with IVF.  Hypertension  Norvasc and Lotensin to be resumed today  BP 137/65  Hypercholesterolemia  Continue Lipitor.  Anxiety / depression  Continue Zoloft.  GERD  Continue PPI therapy.  Thrush  Started diflucan.  Code Status: Full   Family  Communication: None at bedside.   Disposition Plan: Home when stable.   Medical Consultants:  Dr. Drue Second, Oncology  Dr. Dwain Sarna, Surgery  Other consultants:  None  Antibiotics:  Vancomycin 06/25/11--->06/27/11, resumed 06/28/11--->  Primaxin 06/25/11--->06/27/11  Levaquin 06/27/11--->  Acyclovir 06/25/11--->  Subjective: No events overnight. Patient appears very tired today.  Objective:  Vital signs in last 24 hours:  Filed Vitals:   07/01/11 1900 07/01/11 1915 07/01/11 1922 07/01/11 1930  BP: 143/67 147/62 133/68 137/65  Pulse: 69 61 59 72  Temp:   97.5 F (36.4 C) 97.9 F (36.6 C)  TempSrc:      Resp: 19 20 20 19   Height:      Weight:      SpO2: 100% 100% 100% 100%    Intake/Output from previous day:   Intake/Output Summary (Last 24 hours) at 07/01/11 1957 Last data filed at 07/01/11 1924  Gross per 24 hour  Intake 3133.33 ml  Output   1450 ml  Net 1683.33 ml    Physical Exam: General: Alert, awake, oriented x3, in no acute distress. HEENT: No bruits, no goiter. Moist mucous membranes, no scleral icterus, no conjunctival pallor. Heart: Regular rate and rhythm, S1/S2 +, no murmurs, rubs, gallops; left mastectomy scar Lungs: Clear to auscultation bilaterally. No wheezing, no rhonchi, no rales.  Abdomen: Soft, nontender, nondistended, positive bowel sounds. Extremities: No clubbing or cyanosis, no pitting edema,  positive pedal pulses. Neuro: Grossly nonfocal.  Lab Results:    Lab 06/30/11 0520 06/29/11 0450 06/28/11 0540 06/27/11 0505 06/26/11 0510  WBC 8.9 8.4 4.9 1.2* 0.8*  HGB 10.2* 10.4* 10.2* 9.9* 10.2*  HCT 30.5* 31.4* 30.3* 29.6* 31.1*  PLT 118* 87* 54* 51* 46*  MCV 88.4 89.0 88.9 89.2 89.1    Lab 07/01/11 1230 06/26/11 0510 06/25/11 1642 06/25/11 0920  NA -- 137 132* 141  K -- 3.7 3.7 4.1  CL -- 101 97 103  CO2 -- 26 25 29   GLUCOSE -- 218* 203* 237*  BUN -- 8 11 11   CREATININE 0.54 0.54 0.51 0.64  CALCIUM -- 8.8 8.7 9.4  MG --  1.6 -- --   Lab 06/28/11 1245 06/28/11 0540 06/27/11 2201  CKMB 1.8 1.7 1.6  TROPONINI <0.30 <0.30 <0.30  MYOGLOBIN -- -- --    CULTURE, BLOOD (ROUTINE X 2)     Status: Normal (Preliminary result)   Collection Time   06/25/11  4:42 PM      Component Value Range Status Comment   Specimen Description BLOOD PORTA CATH   Final    Special Requests BOTTLES DRAWN AEROBIC AND ANAEROBIC 10CC   Final    Culture  Setup Time [id number]   Final    Culture     Final    Value:        BLOOD CULTURE RECEIVED NO GROWTH TO DATE CULTURE WILL BE HELD FOR 5 DAYS BEFORE ISSUING A FINAL NEGATIVE REPORT   Report Status PENDING   Incomplete   CULTURE, BLOOD (ROUTINE X 2)     Status: Normal (Preliminary result)   Collection Time   06/25/11  4:50 PM      Component Value Range Status Comment   Specimen Description BLOOD RIGHT ARM   Final    Special Requests BOTTLES DRAWN AEROBIC AND ANAEROBIC 5CC   Final    Culture  Setup Time [id number]   Final    Culture     Final    Value:        BLOOD CULTURE RECEIVED NO GROWTH TO DATE CULTURE WILL BE HELD FOR 5 DAYS BEFORE ISSUING A FINAL NEGATIVE REPORT   Report Status PENDING   Incomplete   URINE CULTURE     Status: Normal   Collection Time   06/25/11  8:40 PM      Component Value Range Status Comment   Specimen Description URINE, CLEAN CATCH   Final    Special Requests Immunocompromised   Final    Culture  Setup Time [id number]   Final    Colony Count NO GROWTH   Final    Culture NO GROWTH   Final    Report Status 06/26/2011 FINAL   Final   MRSA PCR SCREENING     Status: Normal   Collection Time   06/25/11 11:41 PM      Component Value Range Status Comment   MRSA by PCR NEGATIVE  NEGATIVE  Final     Medications: Scheduled Meds:   . acetaminophen  1,000 mg Intravenous Q6H  . acyclovir  400 mg Oral Q6H  . atorvastatin  40 mg Oral Daily  . docusate sodium  100 mg Oral BID  . fluconazole  100 mg Oral Daily  . insulin aspart  0-15 Units Subcutaneous TID  WC  . insulin aspart  0-5 Units Subcutaneous QHS  . insulin glargine  15 Units Subcutaneous QHS  . lactose free nutrition  237 mL Oral QHS  . levofloxacin  750 mg Oral Daily  . pantoprazole  40 mg Oral Q1200  . sertraline  50 mg Oral Daily  . sodium chloride  3 mL Intravenous Q12H  . vancomycin  1,000 mg Intravenous Q12H   Continuous Infusions:   . sodium chloride Stopped (07/01/11 1749)  . lactated ringers 1,000 mL (07/01/11 1749)   PRN Meds:.acetaminophen, acetaminophen, albuterol, alum & mag hydroxide-simeth, fentaNYL, guaiFENesin-dextromethorphan, HYDROcodone-acetaminophen, LORazepam, magic mouthwash w/lidocaine, nitroGLYCERIN, ondansetron (ZOFRAN) IV, ondansetron, prochlorperazine, promethazine, sodium chloride, zolpidem, DISCONTD: 0.9 % irrigation (POUR BTL)    LOS: 6 days   Eliyanna Ault 07/01/2011, 7:57 PM  [facility] Pager: [phone]

## 2011-07-01 NOTE — Anesthesia Preprocedure Evaluation (Signed)
Anesthesia Evaluation  Patient identified by MRN, date of birth, ID band Patient awake    Reviewed: Allergy & Precautions, H&P , NPO status , Patient's Chart, lab work & pertinent test results  Airway Mallampati: II TM Distance: <3 FB Neck ROM: Full    Dental No notable dental hx.    Pulmonary neg pulmonary ROS, asthma ,  breath sounds clear to auscultation  Pulmonary exam normal       Cardiovascular hypertension, Pt. on medications Rhythm:Regular Rate:Normal     Neuro/Psych negative neurological ROS  negative psych ROS   GI/Hepatic Neg liver ROS, GERD-  ,  Endo/Other  Diabetes mellitus-, Type 2  Renal/GU negative Renal ROS  negative genitourinary   Musculoskeletal negative musculoskeletal ROS (+)   Abdominal   Peds negative pediatric ROS (+)  Hematology negative hematology ROS (+)   Anesthesia Other Findings   Reproductive/Obstetrics negative OB ROS                           Anesthesia Physical Anesthesia Plan  ASA: III  Anesthesia Plan: General   Post-op Pain Management:    Induction: Intravenous  Airway Management Planned: LMA  Additional Equipment:   Intra-op Plan:   Post-operative Plan:   Informed Consent: I have reviewed the patients History and Physical, chart, labs and discussed the procedure including the risks, benefits and alternatives for the proposed anesthesia with the patient or authorized representative who has indicated his/her understanding and acceptance.   Dental advisory given  Plan Discussed with: CRNA  Anesthesia Plan Comments:         Anesthesia Quick Evaluation

## 2011-07-01 NOTE — H&P (View-Only) (Signed)
Neutropenic fever  Subjective: Feels better today. NO more shaking chills  Objective: Vital signs in last 24 hours: Temp:  [98.1 F (36.7 C)-98.2 F (36.8 C)] 98.2 F (36.8 C) (05/28 0610) Pulse Rate:  [84-89] 89  (05/28 0610) Resp:  [18] 18  (05/28 0610) BP: (130-159)/(71-79) 135/72 mmHg (05/28 0610) SpO2:  [93 %-96 %] 93 % (05/28 0610) Last BM Date: 06/28/11  Intake/Output from previous day: 05/27 0701 - 05/28 0700 In: 2383 [P.O.:980; I.V.:1203; IV Piggyback:200] Out: 700 [Urine:700] Intake/Output this shift: Total I/O In: 240 [P.O.:240] Out: 400 [Urine:400]  General appearance: alert, cooperative and no distress Skin: Skin color, texture, turgor normal. No rashes or lesions or There is fluid under the mastectomy scar, not tender however and may represent a chronic seroma.  Lab Results:  Results for orders placed during the hospital encounter of 06/25/11 (from the past 24 hour(s))  GLUCOSE, CAPILLARY     Status: Abnormal   Collection Time   06/29/11 11:51 AM      Component Value Range   Glucose-Capillary 135 (*) 70 - 99 (mg/dL)  GLUCOSE, CAPILLARY     Status: Abnormal   Collection Time   06/29/11  5:12 PM      Component Value Range   Glucose-Capillary 104 (*) 70 - 99 (mg/dL)  GLUCOSE, CAPILLARY     Status: Abnormal   Collection Time   06/29/11  9:42 PM      Component Value Range   Glucose-Capillary 165 (*) 70 - 99 (mg/dL)  CBC     Status: Abnormal   Collection Time   06/30/11  5:20 AM      Component Value Range   WBC 8.9  4.0 - 10.5 (K/uL)   RBC 3.45 (*) 3.87 - 5.11 (MIL/uL)   Hemoglobin 10.2 (*) 12.0 - 15.0 (g/dL)   HCT 30.5 (*) 36.0 - 46.0 (%)   MCV 88.4  78.0 - 100.0 (fL)   MCH 29.6  26.0 - 34.0 (pg)   MCHC 33.4  30.0 - 36.0 (g/dL)   RDW 15.4  11.5 - 15.5 (%)   Platelets 118 (*) 150 - 400 (K/uL)  GLUCOSE, CAPILLARY     Status: Abnormal   Collection Time   06/30/11  7:24 AM      Component Value Range   Glucose-Capillary 126 (*) 70 - 99 (mg/dL)   Comment  1 Notify RN     Comment 2 Documented in Chart       Studies/Results Radiology     MEDS, Scheduled    . acyclovir  400 mg Oral Q6H  . atorvastatin  40 mg Oral Daily  . docusate sodium  100 mg Oral BID  . fluconazole  100 mg Oral Daily  . insulin aspart  0-15 Units Subcutaneous TID WC  . insulin aspart  0-5 Units Subcutaneous QHS  . insulin glargine  15 Units Subcutaneous QHS  . lactose free nutrition  237 mL Oral QHS  . levofloxacin  750 mg Oral Daily  . pantoprazole  40 mg Oral Q1200  . sertraline  50 mg Oral Daily  . sodium chloride  3 mL Intravenous Q12H  . vancomycin  1,000 mg Intravenous Q12H     Assessment: Neutropenic fever Seroma or abscess at mastectomy site. After the medial area finally closed over, she has accumulated fluid under the flaps. Can't tell if infected, but given the need for chemo it well could become infected and I think needs to be drained  Plan: Discussed with the   patient. Will plan to take to OR tomorrow and open the wound and leave open. She may subsequently benefit from a vac  LOS: 5 days    Alexi Dorminey J. Cherokee Boccio, MD, FACS [facility] [phone]   06/30/2011 8:32 AM         

## 2011-07-01 NOTE — Op Note (Signed)
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 She had a small area of skin slough medially and after she started chemotherapy this became more necrotic and infected but eventually healed. However she then developed a chronic seroma. She was admitted with some evidence of sepsis although we thought this was a clean seroma and not infected. On antibiotic she improved but I decided to open this up and drain it to him and it is a source of potential infection if she continues chemotherapy subsequently.    Description of Procedure: I saw the patient preoperative period from the plans for the procedure. She is taken to the operating room and after satisfactory general anesthesia had been obtained a time out was done. I opened the middle portion of the mastectomy scar and there was cerumen present. Clear fluid was noted and cultured. I then decided to open up the entire length of the stroma cavity. I suctioned all out and there appeared to be a shiny surface on both the skin and deep sides. I thought there was enough of a flap that a VAC pack would not be beneficial as I just packed it open with some sterile 4 x 4's moistened with saline plans for daily wound checks and drainage and packing.  The patient are the procedure well. There were no complications. Counts were correct.  Currie Paris, MD, FACS 07/01/2011 6:33 PM

## 2011-07-01 NOTE — Anesthesia Postprocedure Evaluation (Signed)
  Anesthesia Post-op Note  Patient: [name]  Procedure(s) Performed: Procedure(s) (LRB): EVACUATION HEMATOMA BREAST (Left)  Patient Location: PACU  Anesthesia Type: General  Level of Consciousness: awake, alert , oriented and patient cooperative  Airway and Oxygen Therapy: Patient Spontanous Breathing and Patient connected to nasal cannula oxygen  Post-op Pain: none  Post-op Assessment: Post-op Vital signs reviewed and Patient's Cardiovascular Status Stable  Post-op Vital Signs: Reviewed and stable  Complications: No apparent anesthesia complications

## 2011-07-01 NOTE — Interval H&P Note (Signed)
History and Physical Interval Note:  07/01/2011 5:50 PM  [name]  has presented today for surgery, with the diagnosis of seroma post mastectomy  The various methods of treatment have been discussed with the patient and family. After consideration of risks, benefits and other options for treatment, the patient has consented to  Procedure(s) (LRB): EVACUATION HEMATOMA BREAST (Left) as a surgical intervention .  The patients' history has been reviewed, patient examined, no change in status, stable for surgery.  I have reviewed the patients' chart and labs.  Questions were answered to the patient's satisfaction.     Sumedh Shinsato J

## 2011-07-02 ENCOUNTER — Encounter (INDEPENDENT_AMBULATORY_CARE_PROVIDER_SITE_OTHER): Payer: Medicare Other | Admitting: Surgery

## 2011-07-02 ENCOUNTER — Encounter (HOSPITAL_COMMUNITY): Payer: Self-pay | Admitting: Surgery

## 2011-07-02 DIAGNOSIS — R509 Fever, unspecified: Secondary | ICD-10-CM

## 2011-07-02 LAB — CBC
MCH: 29.3 pg (ref 26.0–34.0)
MCHC: 32.3 g/dL (ref 30.0–36.0)
MCV: 90.7 fL (ref 78.0–100.0)
Platelets: 231 10*3/uL (ref 150–400)
RDW: 15.6 % — ABNORMAL HIGH (ref 11.5–15.5)

## 2011-07-02 LAB — BASIC METABOLIC PANEL
CO2: 29 mEq/L (ref 19–32)
Calcium: 8.7 mg/dL (ref 8.4–10.5)
Creatinine, Ser: 0.47 mg/dL — ABNORMAL LOW (ref 0.50–1.10)
GFR calc Af Amer: >90 mL/min (ref >90)

## 2011-07-02 LAB — CULTURE, BLOOD (ROUTINE X 2)
Culture  Setup Time: 201305240203
Culture: NO GROWTH

## 2011-07-02 MED ORDER — HYDROCODONE-ACETAMINOPHEN 5-325 MG PO TABS: 1.0000 | ORAL_TABLET | Freq: Four times a day (QID) | ORAL | Status: DC | PRN

## 2011-07-02 MED ORDER — HEPARIN SOD (PORK) LOCK FLUSH 100 UNIT/ML IV SOLN
250.0000 [IU] | INTRAVENOUS | Status: AC | PRN
  Administered 2011-07-02: 500 [IU]

## 2011-07-02 MED ORDER — POTASSIUM CHLORIDE CRYS ER 20 MEQ PO TBCR
40.0000 meq | EXTENDED_RELEASE_TABLET | Freq: Once | ORAL | Status: AC
  Administered 2011-07-02: 40 meq via ORAL
  Filled 2011-07-02: qty 2

## 2011-07-02 MED ORDER — FLUCONAZOLE 100 MG PO TABS: 100.0000 mg | ORAL_TABLET | Freq: Every day | ORAL | Status: AC

## 2011-07-02 MED ORDER — DOXYCYCLINE HYCLATE 50 MG PO CAPS: 50.0000 mg | ORAL_CAPSULE | Freq: Two times a day (BID) | ORAL | Status: AC

## 2011-07-02 MED ORDER — POTASSIUM CHLORIDE 20 MEQ/15ML (10%) PO LIQD
40.0000 meq | Freq: Every day | ORAL | Status: DC
  Administered 2011-07-02: 40 meq via ORAL
  Filled 2011-07-02: qty 30

## 2011-07-02 MED ORDER — ACYCLOVIR 200 MG PO CAPS
400.0000 mg | ORAL_CAPSULE | Freq: Four times a day (QID) | ORAL | Status: DC
  Administered 2011-07-02: 400 mg via ORAL
  Filled 2011-07-02 (×4): qty 2

## 2011-07-02 NOTE — Progress Notes (Signed)
CRITICAL VALUE ALERT  Critical value received: Potassium 2.7  Date of notification: 07/02/11  Time of notification:  0646  Critical value read back:yes  Nurse who received alert: JS  MD notified (1st page):  yes  Time of first page: [id number] VALUE ALERT  Critical value received: yes  Date of notification:  07/02/11  Time of notification:  0640  Critical value read back:yes  Nurse who received alert:  JS  MD notified (1st page):  YES  Time of first page:  0652  MD notified (2nd page):  Time of second page:  Responding MD:  yes  Time MD responded: [email or web address]  MD notified (2nd page):  Time of second page:  Responding MD:   Time MD responded:

## 2011-07-02 NOTE — Progress Notes (Signed)
1 Day Post-Op  Subjective: Feels ok and wants to go home  Objective: Vital signs in last 24 hours: Temp:  [97.5 F (36.4 C)-98.5 F (36.9 C)] 98.3 F (36.8 C) (05/30 0525) Pulse Rate:  [59-85] 74  (05/30 0525) Resp:  [9-20] 18  (05/30 0525) BP: (133-161)/(62-87) 150/82 mmHg (05/30 0525) SpO2:  [96 %-100 %] 100 % (05/30 0525)   Intake/Output from previous day: 05/29 0701 - 05/30 0700 In: 2488.3 [I.V.:2088.3; IV Piggyback:400] Out: 2100 [Urine:2100] Intake/Output this shift: Total I/O In: 300 [P.O.:300] Out: 350 [Urine:350]   General appearance: alert and no distress  Incision: Dressing changed by RN - no issues  Lab Results:   Basename 07/02/11 0500 06/30/11 0520  WBC 9.0 8.9  HGB 10.7* 10.2*  HCT 33.1* 30.5*  PLT 231 118*   BMET  Basename 07/02/11 0500 07/01/11 1230  NA 143 --  K 2.7* --  CL 103 --  CO2 29 --  GLUCOSE 161* --  BUN 3* --  CREATININE 0.47* 0.54  CALCIUM 8.7 --   PT/INR No results found for this basename: LABPROT:2,INR:2 in the last 72 hours ABG No results found for this basename: PHART:2,PCO2:2,PO2:2,HCO3:2 in the last 72 hours  MEDS, Scheduled    . acyclovir  400 mg Oral QID  . atorvastatin  40 mg Oral Daily  . docusate sodium  100 mg Oral BID  . fluconazole  100 mg Oral Daily  . insulin aspart  0-15 Units Subcutaneous TID WC  . insulin aspart  0-5 Units Subcutaneous QHS  . insulin glargine  15 Units Subcutaneous QHS  . lactose free nutrition  237 mL Oral QHS  . levofloxacin  750 mg Oral Daily  . pantoprazole  40 mg Oral Q1200  . potassium chloride  40 mEq Oral Daily  . potassium chloride  40 mEq Oral Once  . sertraline  50 mg Oral Daily  . sodium chloride  3 mL Intravenous Q12H  . vancomycin  1,000 mg Intravenous Q12H  . DISCONTD: acetaminophen  1,000 mg Intravenous Q6H  . DISCONTD: acyclovir  400 mg Oral Q6H    Studies/Results: No results found.  Assessment: s/p Procedure(s): EVACUATION seroma BREAST Doing  well  Plan: OK to go home and have daily dressing changes   LOS: 7 days     Currie Paris, MD, [facility], [place] [phone]   07/02/2011 9:39 AM

## 2011-07-02 NOTE — Discharge Instructions (Signed)
Breast cancer status post mastectomy, care after HOME CARE    Care for your wound after the bandages are off as told by your doctor.   Put soft padding such as gauze, soft cloth, or a nursing pad over your wound if you wear a bra.   Ask your doctor about groups that can help you with any emotions you may have after the surgery.   Exercise your arm and shoulder as told by your doctor.   Place your hands on a wall. Use your fingers to "climb the wall." Reach as high as you can until you feel a stretch. When you are not exercising, keep your arm raised (elevated).   When sitting or lying down, put your arm up on pillows or rolled blankets.   Do not use your arm to lift or push anything heavier than 10 pounds (about one gallon of milk ) for the first 6 weeks.   Always take good care of the arm on the side that the breast was removed.   Never let anyone take your blood pressure, draw blood, or give you a shot in that arm.   Do not get even a small cut on that arm or hand. Use a thimble when you sew. Wear heavy gloves when you garden.   Use insect repellent on that arm if outside.   Do not use a razor to shave that underarm. You should use only an electric shaver.   Do not burn that arm. Use a glove when you reach into the oven. Cover your arm with a towel or wear a long-sleeved shirt when you are out in the sun.   Wear your watch and other jewelry on the other arm.   Wear a loose fitting rubber glove when you wash the dishes. Do not leave your hand in water for a long time, especially when you use detergents.   Do not cut your cuticles or hang nails. Push cuticles back with a towel after you take a bath.   Carry your purse or any heavy objects in the other arm.  GET HELP RIGHT AWAY IF:   Your arm becomes very puffy (swollen).   You have redness or pain at the wound site.   There is a bad smell coming from the wound.   Thre is yellowish white fluid (pus) coming from your wound.     You have a fever.  MAKE SURE YOU:   Understand these instructions.   Will watch your condition.   Will get help right away if you are not doing well or get worse.  Document Released: 10/29/2007 Document Revised: 01/08/2011 Document Reviewed: 10/29/2007 [facility] Patient Information 2012 [place], [place].  Incision and Drainage Incision and drainage (I&D) is a procedure in which a cavity-like structure (cystic structure) is opened and drained. The cyst to be drained usually contains material such as pus, fluid, or blood. Gauze is sometimes packed into the cut (incision). Keeping a drain or piece of gauze in the incision keeps the skin from healing first. This helps stop the cyst from forming again. HOME CARE INSTRUCTIONS   Only take over-the-counter or prescription medicines for pain, discomfort, or fever as directed by your caregiver. Use these only if your caregiver has not given medicines that would interfere.   See your caregiver as directed for a recheck.   If medicines (antibiotics) that kill germs were prescribed, take them as directed.  SEEK MEDICAL CARE IF:   You develop increased pain, swelling, redness,  drainage, or bleeding in the wound.   You develop signs of an infection. These signs include muscle aches, chills, or a general ill feeling.   You have a fever.  MAKE SURE YOU:   Understand these instructions.   Will watch your condition.   Will get help right away if you are not doing well or get worse.  Document Released: 07/15/2000 Document Revised: 01/08/2011 Document Reviewed: 09/09/2007 [facility] Patient Information 2012 [place], [place].

## 2011-07-02 NOTE — Progress Notes (Signed)
Porta cath deaccessed. Flushed with 10cc NS followed by Heparin 5ml (100u/ml). No bleeding to site, band aid to site for comfort. [name] M 

## 2011-07-02 NOTE — Discharge Summary (Addendum)
Patient ID: [name] MRN: [id number] DOB/AGE: February 28, 1940 71 y.o.  Admit date: 06/25/2011 Discharge date: 07/02/2011  Primary Care Physician:  Michele Mcalpine, MD, MD  Assessment/Plan:   Principal Problem:   *Neutropenic fever due to left chest wall cellulitis and abscess, postoperative Admitted and placed on broad spectrum antibiotics consisting of Vancomycin and Imipenem, narrowed to Levaquin on 06/27/11 with initial CXR negative for PNA, U/A not consistent with UTI, and no obvious erythema or wound infection noted to left mastectomy site.  Some erythema noted to chest wall skin 06/28/11, after antibiotics narrowed, so Vancomycin resumed 06/28/11.  BCUX and UCUX negative to date.  Developed chest pain on 06/27/11, work up ultimately revealed a left chest wall abscess.  Per surgery daily dressing change We will prescribe doxycycline for 7 days after incision and drainage 07/02/2011 involving entire length of stroma cavity Neupogen given until WBC recovered.  Active Problems:   Chest pain  Developed acute onset chest pain 06/27/11 triggering a rapid response evaluation.  12 lead EKG without ST elevation, cardiac markers negative x 2 sets.  Chest pain resolved with NTG And pain medication.  Given h/o malignancy, we ruled out PE. CT chest did show a left chest wall abscess, likely the source of her pain.  DIABETES MELLITUS, uncontrolled with complications  Hemoglobin A1c 8.6%  Glucophage and Januvia on hold but may resume the medications on discharge Currently on Lantus 15 units Q HS and moderate scale SSI.  Breast cancer, ILC, left, receptor +, her 2 neg  Completed adjuvant chemotherapy with [facility] 06/18/11.  Single agent taxol planned weekly x 12 weeks to begin 07/09/11.  Seen by Dr. Welton Flakes 06/27/11; F/U with her as outpatient.  Pancytopenia / thrombocytopenia  Secondary to chemotherapy.  WBC recovered s/p Neupogen; Other counts recovering as well.  Hyponatremia  Resolved with  IVF.  Hypertension  Norvasc and Lotensin to be resumed today  BP 150/82  Hypercholesterolemia  Continue Lipitor.  Anxiety / depression  Continue Zoloft.  GERD  Continue PPI therapy.  Thrush  Started diflucan. May continue for 10 days on discahrge  Code Status: Full   Family Communication: None at bedside.   Disposition Plan: Home with home care  Medical Consultants:  Dr. Drue Second, Oncology  Dr. Dwain Sarna, Surgery  Other consultants:  None  Antibiotics:  Vancomycin 06/25/11--->06/27/11, resumed 06/28/11---> 07/02/2011 Primaxin 06/25/11--->06/27/11  Levaquin 06/27/11---> 07/02/2011 Acyclovir 06/25/11--->resume on discharge per home regimen  Medication List  As of 07/02/2011 12:24 PM   TAKE these medications         acyclovir 200 MG capsule   Commonly known as: ZOVIRAX   Take 2 capsules (400 mg total) by mouth every 6 (six) hours. While awake.      amLODipine 10 MG tablet   Commonly known as: NORVASC   Take 1 tablet (10 mg total) by mouth daily.      atorvastatin 40 MG tablet   Commonly known as: LIPITOR   Take 1 tablet (40 mg total) by mouth daily.      benazepril 40 MG tablet   Commonly known as: LOTENSIN   Take 1 tablet (40 mg total) by mouth daily.      dexamethasone 4 MG tablet   Commonly known as: DECADRON   Take 2 tablets by mouth once a day on the day after chemotherapy and then take 2 tablets two times a day for 2 days. Take with food.      doxycycline 50 MG capsule   Commonly known  as: VIBRAMYCIN   Take 1 capsule (50 mg total) by mouth 2 (two) times daily.      fluconazole 100 MG tablet   Commonly known as: DIFLUCAN   Take 1 tablet (100 mg total) by mouth daily.      HYDROcodone-acetaminophen 5-325 MG per tablet   Commonly known as: NORCO   Take 1 tablet by mouth every 6 (six) hours as needed for pain. Pain.      LANTUS SOLOSTAR Boaz   Inject 15 Units into the skin daily.      lidocaine-prilocaine cream   Commonly known as: EMLA   Apply 1  application topically as needed. For pain      metFORMIN 500 MG 24 hr tablet   Commonly known as: GLUCOPHAGE-XR   TAKE ONE TABLET BY MOUTH TWICE DAILY      omeprazole 20 MG capsule   Commonly known as: PRILOSEC   Take 1 capsule (20 mg total) by mouth daily.      ondansetron 8 MG tablet   Commonly known as: ZOFRAN   Take 1 tablet two times a day as needed for nausea or vomiting starting on the third day after chemotherapy.      PRESCRIPTION MEDICATION   Inject 208 mg into the vein See admin instructions. epirubicin (ELLENCE) chemo injection 208 mg 100 mg/m2  2.07 m2 (Treatment Plan Actual)  Once 06/18/2011    Route:  Intravenous      PRESCRIPTION MEDICATION   Inject 1,050 mg into the vein See admin instructions. Fluorouracil (ADRUCIL) chemo injection 1,050 mg 500 mg/m2  2.07 m2 (Treatment Plan Actual)  Once 06/18/2011  Route: intravenous      PRESCRIPTION MEDICATION   Inject 1,040 mg into the vein See admin instructions. cyclophosphamide (CYTOXAN) 1,040 mg in sodium chloride 0.9 % 250 mL chemo infusion  06/18/2011  Rout: Intravenous      prochlorperazine 10 MG tablet   Commonly known as: COMPAZINE   Take 1 tablet (10 mg total) by mouth every 6 (six) hours as needed (Nausea or vomiting).      sertraline 50 MG tablet   Commonly known as: ZOLOFT   Take 1 tablet (50 mg total) by mouth daily.      sitaGLIPtin 50 MG tablet   Commonly known as: JANUVIA   Take 50 mg by mouth daily.      Vitamin D 2000 UNITS tablet   Take 2,000 Units by mouth daily.            Significant Diagnostic Studies:  Dg Chest 1 View (portable) 06/25/2011   IMPRESSION: No active cardiopulmonary disease.     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   Physical Exam  on Discharge:  Filed Vitals:   07/01/11 2030 07/01/11 2115 07/02/11 0200 07/02/11 0525  BP: 144/83 144/76 155/87 150/82  Pulse: 67 78 68 74  Temp: 98.1 F (36.7 C) 98 F (36.7 C) 97.8 F (36.6 C) 98.3 F (36.8 C)  TempSrc: Oral Oral Oral Oral  Resp: 18 18 18 18   Height:      Weight:      SpO2: 100% 100% 100% 100%     Intake/Output Summary (Last 24 hours) at 07/02/11 1224 Last data filed at 07/02/11 0829  Gross per  24 hour  Intake 2788.33 ml  Output   2050 ml  Net 738.33 ml    General: Alert, awake, oriented x3, in no acute distress. HEENT: No bruits, no goiter. Heart: Regular rate and rhythm, without murmurs, rubs, gallops; left mastectomy scar. Lungs: Clear to auscultation bilaterally. Abdomen: Soft, nontender, nondistended, positive bowel sounds. Extremities: No clubbing cyanosis or edema with positive pedal pulses. Neuro: Grossly intact, nonfocal.  CBC:    Component Value Date/Time   WBC 9.0 07/02/2011 0500   WBC 1.0* 06/25/2011 0920   HGB 10.7* 07/02/2011 0500   HGB 12.1 06/25/2011 0920   HCT 33.1* 07/02/2011 0500   HCT 36.4 06/25/2011 0920   PLT 231 07/02/2011 0500   PLT 58* 06/25/2011 0920   MCV 90.7 07/02/2011 0500   MCV 87.9 06/25/2011 0920   NEUTROABS 6.0 06/29/2011 0450   NEUTROABS 0.4* 06/25/2011 0920   LYMPHSABS 1.1 06/29/2011 0450   LYMPHSABS 0.5* 06/25/2011 0920   MONOABS 0.7 06/29/2011 0450   MONOABS 0.0* 06/25/2011 0920   EOSABS 0.4 06/29/2011 0450   EOSABS 0.1 06/25/2011 0920   BASOSABS 0.2* 06/29/2011 0450   BASOSABS 0.0 06/25/2011 0920    Basic Metabolic Panel:    Component Value Date/Time   NA 143 07/02/2011 0500   K 2.7* 07/02/2011 0500   CL 103 07/02/2011 0500   CO2 29 07/02/2011 0500   BUN 3* 07/02/2011 0500   CREATININE 0.47* 07/02/2011 0500   GLUCOSE 161* 07/02/2011 0500   CALCIUM 8.7 07/02/2011 0500    Time spent on Discharge: 45 minutes  Signed: Alishah Schulte 07/02/2011, 12:24 PM

## 2011-07-03 ENCOUNTER — Encounter (INDEPENDENT_AMBULATORY_CARE_PROVIDER_SITE_OTHER): Payer: Medicare Other | Admitting: Surgery

## 2011-07-03 ENCOUNTER — Telehealth (INDEPENDENT_AMBULATORY_CARE_PROVIDER_SITE_OTHER): Payer: Self-pay

## 2011-07-03 LAB — GLUCOSE, CAPILLARY: Glucose-Capillary: 149 mg/dL — ABNORMAL HIGH (ref 70–99)

## 2011-07-03 NOTE — Telephone Encounter (Signed)
I spoke with the patient this morning about her home health care visits to change her breast wound.  [facility] has contacted her and they will be coming out today to change her dressing.  They will discuss the teaching process with her then.  The patient understood and agreed.

## 2011-07-05 LAB — BODY FLUID CULTURE: Gram Stain: NONE SEEN

## 2011-07-06 ENCOUNTER — Telehealth: Payer: Self-pay | Admitting: Pulmonary Disease

## 2011-07-06 ENCOUNTER — Telehealth: Payer: Self-pay | Admitting: Oncology

## 2011-07-06 ENCOUNTER — Telehealth: Payer: Self-pay | Admitting: Medical Oncology

## 2011-07-06 NOTE — Telephone Encounter (Signed)
lmonvm adviisng the pt of her June 2013 appt

## 2011-07-06 NOTE — Telephone Encounter (Signed)
Patient called informing that she was just discharged from the hospital and wanted to know if she should keep her appointments with lab, MD, and chemo infusion for 6/7 as she also has an appointment with Dr. Jamey Ripa 6/7.  Per POF from 5/23, appointments have not been scheduled for patient.   Spoke with Colman Cater, NP regarding situation.  New POF sent to oncology schedulers.

## 2011-07-06 NOTE — Telephone Encounter (Signed)
Told jennifer with [facility] they should contact the surgeon that ordered the home health, jennifer states she would do that

## 2011-07-08 ENCOUNTER — Telehealth (INDEPENDENT_AMBULATORY_CARE_PROVIDER_SITE_OTHER): Payer: Self-pay | Admitting: General Surgery

## 2011-07-08 ENCOUNTER — Telehealth: Payer: Self-pay | Admitting: Pulmonary Disease

## 2011-07-08 DIAGNOSIS — Z0189 Encounter for other specified special examinations: Secondary | ICD-10-CM

## 2011-07-08 DIAGNOSIS — IMO0001 Reserved for inherently not codable concepts without codable children: Secondary | ICD-10-CM

## 2011-07-08 NOTE — Telephone Encounter (Signed)
Per SN---ok to eval and treat.  thanks

## 2011-07-08 NOTE — Telephone Encounter (Signed)
[name] was calling in provide her contact number to Dr. Tenna Child nurse in case she needed to be contacted about this patient. Her number is [phone]. 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

## 2011-07-08 NOTE — Telephone Encounter (Signed)
Order placed and lmomtcb x1 for [name] to make aware

## 2011-07-08 NOTE — Telephone Encounter (Signed)
Please advise if SN is okay with [place] placing PT order for patient. Thanks.

## 2011-07-08 NOTE — Telephone Encounter (Signed)
Victorino Dike, nurse with [facility], calling to report small amount of cream-to-pale green drainage with a slight odor; noted today.  She has no fever and is on Acyclovir 400 mg Q6H and Doxycycline BID. She is coming for appt on 07/10/11, but wanted the physician to be aware.

## 2011-07-08 NOTE — Telephone Encounter (Signed)
Spoke with [name]. The order was actually needed for eval and tx of OT. I sent order to [facility] for this. [name] states nothing further needed.

## 2011-07-10 ENCOUNTER — Ambulatory Visit (INDEPENDENT_AMBULATORY_CARE_PROVIDER_SITE_OTHER): Payer: Medicare Other | Admitting: Surgery

## 2011-07-10 ENCOUNTER — Encounter (INDEPENDENT_AMBULATORY_CARE_PROVIDER_SITE_OTHER): Payer: Self-pay | Admitting: Surgery

## 2011-07-10 VITALS — BP 122/82 | HR 104 | Resp 20 | Ht 68.0 in | Wt 175.0 lb

## 2011-07-10 DIAGNOSIS — IMO0002 Reserved for concepts with insufficient information to code with codable children: Secondary | ICD-10-CM

## 2011-07-10 NOTE — Progress Notes (Signed)
Chief complaint: Postoperative visit after drainage of mastectomy seroma  History of present illness: This patient had a mastectomy several weeks ago and has been doing chemotherapy but had a chronic seroma and then developed sepsis. However the seroma did not appear infected but I was concerned that it was a potential that her ongoing chemotherapy that we would have a significant potential for a bad infection so this was opened and drained and packed. She comes in for reevaluation today. The packing is being done by the Wound Care people.  Exam: Vital signs:BP 122/82  Pulse 104  Resp 20  Ht 5\' 8"  (1.727 m)  Wt 175 lb (79.379 kg)  BMI 26.61 kg/m2  General: The patient is depressed and sad today. Wound: The flaps are now stuck down. The base was clean granulation tissue and is more shallow than it has been. There is no evidence of superficial infection currently.  Impression: Resolving wound problem  Plan: Continue local wound care and I will see her next week. I think this will continue to granulate and heal over.

## 2011-07-10 NOTE — Patient Instructions (Signed)
See me again next week. Continue dressing changes as you are having done.

## 2011-07-14 ENCOUNTER — Telehealth: Payer: Self-pay | Admitting: *Deleted

## 2011-07-14 ENCOUNTER — Ambulatory Visit (HOSPITAL_BASED_OUTPATIENT_CLINIC_OR_DEPARTMENT_OTHER): Payer: Medicare Other | Admitting: Oncology

## 2011-07-14 ENCOUNTER — Encounter: Payer: Self-pay | Admitting: Oncology

## 2011-07-14 ENCOUNTER — Ambulatory Visit (HOSPITAL_BASED_OUTPATIENT_CLINIC_OR_DEPARTMENT_OTHER): Payer: Medicare Other

## 2011-07-14 VITALS — BP 141/88 | HR 89 | Temp 97.5°F | Ht 68.0 in | Wt 180.5 lb

## 2011-07-14 DIAGNOSIS — C50319 Malignant neoplasm of lower-inner quadrant of unspecified female breast: Secondary | ICD-10-CM

## 2011-07-14 DIAGNOSIS — Z170 Estrogen receptor positive status [ER+]: Secondary | ICD-10-CM

## 2011-07-14 LAB — COMPREHENSIVE METABOLIC PANEL
ALT: 19 U/L (ref 0–35)
Albumin: 3.9 g/dL (ref 3.5–5.2)
CO2: 27 mEq/L (ref 19–32)
Chloride: 101 mEq/L (ref 96–112)
Glucose, Bld: 153 mg/dL — ABNORMAL HIGH (ref 70–99)
Potassium: 4.5 mEq/L (ref 3.5–5.3)
Sodium: 140 mEq/L (ref 135–145)
Total Bilirubin: 0.3 mg/dL (ref 0.3–1.2)
Total Protein: 6.6 g/dL (ref 6.0–8.3)

## 2011-07-14 LAB — CBC WITH DIFFERENTIAL/PLATELET
BASO%: 3.4 % — ABNORMAL HIGH (ref 0.0–2.0)
Eosinophils Absolute: 0 10*3/uL (ref 0.0–0.5)
LYMPH%: 21.3 % (ref 14.0–49.7)
MCHC: 33.2 g/dL (ref 31.5–36.0)
MONO#: 0.7 10*3/uL (ref 0.1–0.9)
NEUT#: 3.2 10*3/uL (ref 1.5–6.5)
RBC: 4.18 10*6/uL (ref 3.70–5.45)
RDW: 16.7 % — ABNORMAL HIGH (ref 11.2–14.5)
WBC: 5.2 10*3/uL (ref 3.9–10.3)
lymph#: 1.1 10*3/uL (ref 0.9–3.3)

## 2011-07-14 MED ORDER — ONDANSETRON HCL 8 MG PO TABS: ORAL_TABLET | ORAL | Status: DC

## 2011-07-14 MED ORDER — PROCHLORPERAZINE 25 MG RE SUPP: 25.0000 mg | Freq: Two times a day (BID) | RECTAL | Status: DC | PRN

## 2011-07-14 MED ORDER — LETROZOLE 2.5 MG PO TABS: 2.5000 mg | ORAL_TABLET | Freq: Every day | ORAL | Status: DC

## 2011-07-14 MED ORDER — LORAZEPAM 0.5 MG PO TABS: 0.5000 mg | ORAL_TABLET | Freq: Four times a day (QID) | ORAL | Status: DC | PRN

## 2011-07-14 MED ORDER — DEXAMETHASONE 4 MG PO TABS: ORAL_TABLET | ORAL | Status: DC

## 2011-07-14 MED ORDER — PROCHLORPERAZINE MALEATE 10 MG PO TABS: 10.0000 mg | ORAL_TABLET | Freq: Four times a day (QID) | ORAL | Status: DC | PRN

## 2011-07-14 NOTE — Progress Notes (Signed)
OFFICE PROGRESS NOTE  CC  Hailey Mcalpine, MD, MD [facility] [address] [place] [address] [place] [address] Dr. Calton Dach Dr. Chipper Herb Dr. Romero Belling  DIAGNOSIS: 71 year old female with stage III a invasive lobular carcinoma of the left breast status post mastectomy with left axillary lymph node dissection performed on 03/23/2011.  PRIOR THERAPY:  #1 patient was originally seen by me on 03/05/2011 for a invasive lobular carcinoma of the left breast the tumor was 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 Her clinical staging was stage IIB (T3, N0, M0).  #2 the patient has gone on to have a left breast mastectomy with axillary lymph node dissection on 03/23/2011. The final pathology revealed a 6.0 cm invasive lobular carcinoma with lymphovascular invasion and perineural invasion. Invasive tumor was 2.9 cm from the nearest margin lobular carcinoma in situ was noted. Initially 6 lymph nodes were positive for metastatic carcinoma. Tumor deposits were 0.5 0.8 0.7 0.7 0.3 and 1.0 cm focal extracapsular tumor extension was noted. Tumor was grade 3. The final lymph node count was 16 9 of which were positive for metastatic disease. Tumor was estrogen receptor +100% progesterone receptor +100% HER-2/neu negative with a ratio 1.31 proliferation marker 18% final pathologic staging was p T3a pN2a, Stage IIIa   #3.patient is status post 4 cycles of FEC combination chemotherapy starting from 04/17/2011 may 2013. Her course was complicated by development of febrile neutropenia sepsis pancytopenia and mastectomy site cellulitis and chest wall abscess requiring incision and drainage.  CURRENT THERAPY: patient to begin letrozole 2.5 mg daily adjuvantly.  INTERVAL HISTORY: [name] 71 y.o. female returns for Followup visit. Patient was recently hospitalized for pancytopenia with infection an abscess of the chest wall. She underwent incision  and drainage of the abscess and 07/02/2011. During her hospitalization she did receive broad-spectrum antibiotics including vancomycin and imipenem and subsequently Levaquin. She was discharged to home on 07/02/2011 with daily dressing changes as well as 7 days of doxycycline. Today she feels much better. She denies any nausea vomiting fevers chills night sweats. She is still weak. She has no chest pains. No bleeding problems. Remainder of the 10 point review of systems is negative.  MEDICAL HISTORY: Past Medical History  Diagnosis Date  . DIABETES MELLITUS 08/10/2007  . 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 HYPERCHOLESTEROLEMIA 12/17/2006  . OBESITY 06/04/2008  . ANXIETY 06/04/2008  . DEPRESSION 08/10/2007  . HYPERTENSION 12/17/2006  . ASTHMATIC BRONCHITIS, ACUTE 06/04/2008  . ALLERGIC RHINITIS 12/13/2007  . GERD 12/17/2006  . DEGENERATIVE JOINT DISEASE 12/17/2006  . Asthma   . Blood transfusion 1964    post childbirth  . 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  MEDICATIONS:  Current Outpatient Prescriptions  Medication Sig Dispense Refill  . acyclovir (ZOVIRAX) 200 MG capsule Take 2 capsules (400 mg total) by mouth every 6 (six) hours. While awake.  60 capsule  3  . amLODipine (NORVASC) 10 MG tablet Take 1 tablet (10 mg total) by mouth daily.  30 tablet  11  . atorvastatin (LIPITOR) 40 MG tablet Take 1 tablet (40 mg total) by mouth daily.  30 tablet  11  . benazepril (LOTENSIN) 40 MG tablet Take 1 tablet (40 mg total) by mouth daily.  30 tablet  11  . Cholecalciferol (VITAMIN D) 2000 UNITS tablet Take 2,000 Units by mouth  daily.        . dexamethasone (DECADRON) 4 MG tablet Take 2 tablets by mouth once a day on the day after chemotherapy and then take 2 tablets two times a day for 2 days. Take with food.  30 tablet  1  .  HYDROcodone-acetaminophen (NORCO) 5-325 MG per tablet Take 1 tablet by mouth every 6 (six) hours as needed for pain. Pain.  30 tablet  0  . Insulin Glargine (LANTUS SOLOSTAR [place]) Inject 15 Units into the skin daily.      Marland Kitchen lidocaine-prilocaine (EMLA) cream Apply 1 application topically as needed. For pain      . metFORMIN (GLUCOPHAGE-XR) 500 MG 24 hr tablet TAKE ONE TABLET BY MOUTH TWICE DAILY  60 tablet  5  . omeprazole (PRILOSEC) 20 MG capsule Take 1 capsule (20 mg total) by mouth daily.  30 capsule  11  . ondansetron (ZOFRAN) 8 MG tablet Take 1 tablet two times a day as needed for nausea or vomiting starting on the third day after chemotherapy.  30 tablet  1  . PRESCRIPTION MEDICATION Inject 208 mg into the vein See admin instructions. epirubicin (ELLENCE) chemo injection 208 mg 100 mg/m2  2.07 m2 (Treatment Plan Actual)  Once 06/18/2011   Route:  Intravenous      . PRESCRIPTION MEDICATION Inject 1,050 mg into the vein See admin instructions. Fluorouracil (ADRUCIL) chemo injection 1,050 mg 500 mg/m2  2.07 m2 (Treatment Plan Actual)  Once 06/18/2011 Route: intravenous      . PRESCRIPTION MEDICATION Inject 1,040 mg into the vein See admin instructions. cyclophosphamide (CYTOXAN) 1,040 mg in sodium chloride 0.9 % 250 mL chemo infusion  06/18/2011 Rout: Intravenous      . prochlorperazine (COMPAZINE) 10 MG tablet Take 1 tablet (10 mg total) by mouth every 6 (six) hours as needed (Nausea or vomiting).  30 tablet  1  . sertraline (ZOLOFT) 50 MG tablet Take 1 tablet (50 mg total) by mouth daily.  30 tablet  5  . sitaGLIPtin (JANUVIA) 50 MG tablet Take 50 mg by mouth daily.        SURGICAL HISTORY:  Past Surgical History  Procedure Date  . Laminectomy [id number]    s/p-Dr. Jule Ser  . Bladder tack 1974  . Cholecystectomy 02/23/1991    [facility] - Dr Jamey Ripa  . Tonsillectomy   . Rotator cuff repair 8/03    right, Dr. Shelle Iron  . Enterocele repair 06/08    Dr. Estanislado Pandy  . Rotator cuff repair 10/26/2008    left,  Dr. Rayburn Ma  . Hip surgery 08/27/2010    DR. Despina Hick  . Breast excisional biopsy 11/10/1988    left benign Dr Jamey Ripa  . Breast excisional biopsy 03/04/1983    Right - two areas - Dr Jamey Ripa  . Breast excisional biopsy 10/09/1980    right - Dr Jamey Ripa  . Breast excisional biopsy 10/18/1979    left - Dr Jamey Ripa  . Breast excisional biopsy 01/02/1994    right - Dr Jamey Ripa  . Vaginal hysterectomy 1974    partial   . Colonoscopy   . Modified radical mastectomy w/ axillary lymph node dissection     03-30-11 LT  . Portacath placement 04/13/2011    Procedure: INSERTION PORT-A-CATH;  Surgeon: Currie Paris, MD;  Location: [facility];  Service: General;  Laterality: N/A;  porta cath placement,removal of J-P drain  . Mastectomy 03/23/2011    left  . Drain seroma 05/01/11    Chronic seroma @mastectomy  site  .  Evacuation breast hematoma 07/01/2011    Procedure: EVACUATION HEMATOMA BREAST;  Surgeon: Currie Paris, MD;  Location: [facility];  Service: General;  Laterality: Left;  Drainage of left mastectomy seroma    REVIEW OF SYSTEMS:  Pertinent items are noted in HPI.   PHYSICAL EXAMINATION: General: Patient is fatigued looks dehydrated but in no acute distress. HEENT: EOMI PERRLA oral mucosa is dry neck is supple no palpable adenopathy. Lungs: Clear to auscultation cardiovascular: Regular rate rhythm abdomen: Soft nontender nondistended. Extremities: No edema clubbing or cyanosis neuro: Nonfocal, Left mastectomy incision now has daily dressing changes there is no evidence of cellulitis or active infection.  ECOG PERFORMANCE STATUS: 1 - Symptomatic but completely ambulatory  Blood pressure 141/88, pulse 89, temperature 97.5 F (36.4 C), temperature source Oral, height 5\' 8"  (1.727 m), weight 180 lb 8 oz (81.874 kg).  LABORATORY DATA: Lab Results  Component Value Date   WBC 9.0 07/02/2011   HGB 10.7* 07/02/2011   HCT 33.1* 07/02/2011   MCV 90.7 07/02/2011   PLT 231 07/02/2011        Chemistry      Component Value Date/Time   NA 143 07/02/2011 0500   K 2.7* 07/02/2011 0500   CL 103 07/02/2011 0500   CO2 29 07/02/2011 0500   BUN 3* 07/02/2011 0500   CREATININE 0.47* 07/02/2011 0500      Component Value Date/Time   CALCIUM 8.7 07/02/2011 0500   ALKPHOS 85 06/26/2011 0510   AST 12 06/26/2011 0510   ALT 17 06/26/2011 0510   BILITOT 0.6 06/26/2011 0510      ASSESSMENT: 71 year old female with  #1 invasive lobular carcinoma of the left breast status post mastectomy with axillary lymph node dissection. The final pathology revealed a T3 N2 way invasive lobular carcinoma. The tumor measured 6.0 cm 9 of 16 lymph nodes were positive for metastatic disease. Tumor was ER positive PR positive HER-2/neu negative proliferation marker 18%.  #2 patient is now status post 4 cycles of combination chemotherapy consisting of FEC. Her last treatment was given on May 2013. She did require recurrent hospitalizations throughout this treatment. She's had recurrent infections most recently chest wall [place]. She did become significantly pancytopenic.  #3 patient was scheduled to begin weekly Taxol as part of her adjuvant regimen but at this point I do think that the risks of treatment for her weigh the benefits since patient has not had good tolerance to any of the chemotherapeutic agents. We certainly could do this at some later point to once patient begins feel better and her chest wall region in an infection results. She and I. Therefore discussed the possibility of starting her on antiestrogen therapy since her tumor is estrogen receptor positive. We discussed the letrozole 2.5 mg daily. Side effects risks and benefits were discussed very clearly to the patient and literature was given to her.  #4 certainly patient will need radiation therapy as well and I will refer her to Dr. Chipper Herb for an initial evaluation. I certainly would think he will be holding on radiation until she recovers from his  acute infection.   PLAN:  #1 begin letrozole 2.5 mg daily.  #2 I will plan on seeing her back in 3 months time.  #3 patient will be referred to Dr. Chipper Herb for consideration of radiation therapy.  All questions were answered. The patient knows to call the clinic with any problems, questions or concerns. We can certainly see the patient much sooner if necessary.  I spent 30 minutes counseling the patient face to face. The total time spent in the appointment was 30 minutes.    Drue Second, MD Medical/Oncology [facility] ([address]) [phone] (beeper) [phone] (Office)  07/14/2011, 12:26 PM

## 2011-07-14 NOTE — Telephone Encounter (Signed)
made appointment for 07-21-2011 starting at 1:30pm with dr.murray  gave patient appointment for 10-16-2011 starting at 11:30am  sent patient back to the lab on 07-14-2011 printed out calendar and gave back to the patient

## 2011-07-14 NOTE — Patient Instructions (Signed)
1. Begin letrozole 2.5 mg daily as discussed  2. Refer to Dr. Dayton Scrape for Radiation   3. I will see you back in 3 months

## 2011-07-17 ENCOUNTER — Ambulatory Visit (INDEPENDENT_AMBULATORY_CARE_PROVIDER_SITE_OTHER): Payer: Medicare Other | Admitting: Endocrinology

## 2011-07-17 ENCOUNTER — Encounter (INDEPENDENT_AMBULATORY_CARE_PROVIDER_SITE_OTHER): Payer: Self-pay | Admitting: Surgery

## 2011-07-17 ENCOUNTER — Encounter: Payer: Self-pay | Admitting: Endocrinology

## 2011-07-17 ENCOUNTER — Ambulatory Visit (INDEPENDENT_AMBULATORY_CARE_PROVIDER_SITE_OTHER): Payer: Medicare Other | Admitting: Surgery

## 2011-07-17 ENCOUNTER — Telehealth: Payer: Self-pay | Admitting: Pulmonary Disease

## 2011-07-17 VITALS — BP 142/88 | HR 88 | Temp 97.2°F | Resp 18 | Ht 68.0 in | Wt 181.0 lb

## 2011-07-17 VITALS — BP 132/76 | HR 108 | Temp 97.7°F | Ht 68.0 in | Wt 180.0 lb

## 2011-07-17 DIAGNOSIS — Z09 Encounter for follow-up examination after completed treatment for conditions other than malignant neoplasm: Secondary | ICD-10-CM

## 2011-07-17 DIAGNOSIS — E119 Type 2 diabetes mellitus without complications: Secondary | ICD-10-CM

## 2011-07-17 NOTE — Patient Instructions (Addendum)
check your blood sugar 2 times a day.  vary the time of day when you check, between before the 3 meals, and at bedtime.  also check if you have symptoms of your blood sugar being too high or too low.  please keep a record of the readings and bring it to your next appointment here.  please call us sooner if your blood sugar goes below 70, or if it stays over 200. Stop metformin and januvia.   Increase lantus to 25 units per day.  Then increase to 30 if necessary to keep the blood sugar in the 100's. Please come back for a follow-up appointment for 1 month.

## 2011-07-17 NOTE — Patient Instructions (Signed)
See me again in three weeks. If there are any problems before then, call my nurse, Lesly Rubenstein, ans she will arrange for someone to see you.

## 2011-07-17 NOTE — Progress Notes (Signed)
Subjective:    Patient ID: [name], female    DOB: October 14, 1940, 71 y.o.   MRN: [id number]  HPI Pt returns for f/u of type 2 DM (dx'ed 2007; no known complications).  She is off prednisone.  Pt says cbg's vary from 130-190.  She is feeling better since chemotx. Past Medical History  Diagnosis Date  . DIABETES MELLITUS 08/10/2007  . 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 HYPERCHOLESTEROLEMIA 12/17/2006  . OBESITY 06/04/2008  . ANXIETY 06/04/2008  . DEPRESSION 08/10/2007  . HYPERTENSION 12/17/2006  . ASTHMATIC BRONCHITIS, ACUTE 06/04/2008  . ALLERGIC RHINITIS 12/13/2007  . GERD 12/17/2006  . DEGENERATIVE JOINT DISEASE 12/17/2006  . Asthma   . Blood transfusion 1964    post childbirth  . 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 History of chemotherapy     Past Surgical History  Procedure Date  . Laminectomy [id number]    s/p-Dr. Jule Ser  . Bladder tack 1974  . Cholecystectomy 02/23/1991    [facility] - Dr Jamey Ripa  . Tonsillectomy   . Rotator cuff repair 8/03    right, Dr. Shelle Iron  . Enterocele repair 06/08    Dr. Estanislado Pandy  . Rotator cuff repair 10/26/2008    left, Dr. Rayburn Ma  . Hip surgery 08/27/2010    DR. Despina Hick  . Breast excisional biopsy 11/10/1988    left benign Dr Jamey Ripa  . Breast excisional biopsy 03/04/1983    Right - two areas - Dr Jamey Ripa  . Breast excisional biopsy 10/09/1980    right - Dr Jamey Ripa  . Breast excisional biopsy 10/18/1979    left - Dr Jamey Ripa  . Breast excisional biopsy 01/02/1994    right - Dr Jamey Ripa  . Vaginal hysterectomy 1974    partial   . Colonoscopy   . Modified radical mastectomy w/ axillary lymph node dissection     03-30-11 LT  . Portacath placement 04/13/2011    Procedure: INSERTION PORT-A-CATH;  Surgeon: Currie Paris, MD;  Location: [facility];  Service: General;  Laterality: N/A;  porta cath placement,removal of J-P drain  . Mastectomy 03/23/2011    left  . Drain seroma 05/01/11    Chronic seroma @mastectomy  site    . Evacuation breast hematoma 07/01/2011    Procedure: EVACUATION HEMATOMA BREAST;  Surgeon: Currie Paris, MD;  Location: [facility];  Service: General;  Laterality: Left;  Drainage of left mastectomy seroma    History   Social History  . Marital Status: Married    Spouse Name: N/A    Number of Children: 3  . Years of Education: N/A   Occupational History  .     Social History Main Topics  . Smoking status: Never Smoker   . Smokeless tobacco: Never Used  . Alcohol Use: No  . Drug Use: No  . Sexually Active: Not Currently   Other Topics Concern  . 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 acyclovir (ZOVIRAX) 200 MG capsule Take 2 capsules (400 mg total) by mouth every 6 (six) hours. While awake.  60 capsule  3  . amLODipine (NORVASC) 10 MG tablet Take 1 tablet (10 mg total) by mouth daily.  30 tablet  11  . atorvastatin (LIPITOR) 40 MG tablet Take  1 tablet (40 mg total) by mouth daily.  30 tablet  11  . benazepril (LOTENSIN) 40 MG tablet Take 1 tablet (40 mg total) by mouth daily.  30 tablet  11  . Cholecalciferol (VITAMIN D) 2000 UNITS tablet Take 2,000 Units by mouth daily.        Marland Kitchen HYDROcodone-acetaminophen (NORCO) 5-325 MG per tablet Take 1 tablet by mouth every 6 (six) hours as needed for pain. Pain.  30 tablet  0  . Insulin Glargine (LANTUS SOLOSTAR [place]) Inject 25 Units into the skin daily.       Marland Kitchen letrozole (FEMARA) 2.5 MG tablet Take 1 tablet (2.5 mg total) by mouth daily.  30 tablet  6  . lidocaine-prilocaine (EMLA) cream Apply 1 application topically as needed. For pain      . omeprazole (PRILOSEC) 20 MG capsule Take 1 capsule (20 mg total) by mouth daily.  30 capsule  11  . PRESCRIPTION MEDICATION Inject 208 mg into the vein See admin  instructions. epirubicin (ELLENCE) chemo injection 208 mg 100 mg/m2  2.07 m2 (Treatment Plan Actual)  Once 06/18/2011   Route:  Intravenous      . PRESCRIPTION MEDICATION Inject 1,050 mg into the vein See admin instructions. Fluorouracil (ADRUCIL) chemo injection 1,050 mg 500 mg/m2  2.07 m2 (Treatment Plan Actual)  Once 06/18/2011 Route: intravenous      . PRESCRIPTION MEDICATION Inject 1,040 mg into the vein See admin instructions. cyclophosphamide (CYTOXAN) 1,040 mg in sodium chloride 0.9 % 250 mL chemo infusion  06/18/2011 Rout: Intravenous      . sertraline (ZOLOFT) 50 MG tablet Take 1 tablet (50 mg total) by mouth daily.  30 tablet  5  . DISCONTD: prochlorperazine (COMPAZINE) 10 MG tablet Take 1 tablet (10 mg total) by mouth every 6 (six) hours as needed (Nausea or vomiting).  30 tablet  1    Allergies  Allergen Reactions  . Ceftriaxone Sodium     REACTION: hives  . Cephalexin     REACTION: hives  . Clindamycin     REACTION: hives  . Dilaudid (Hydromorphone Hcl) Itching  . Oxycodone-Acetaminophen     REACTION: hives  . Rocephin (Ceftriaxone Sodium In Dextrose) Hives  . Sulfonamide Derivatives     REACTION: hives    Family History  Problem Relation Age of Onset  . COPD Mother   . Arthritis Mother   . Emphysema Mother   . Mental illness Paternal Grandfather   . Heart disease Other     Sibling  . Diabetes Other     Sibling   . Diabetes Brother   . Cancer Brother     prostate  . Cancer Father     malignant brain tumor    BP 132/76  Pulse 108  Temp 97.7 F (36.5 C) (Oral)  Ht 5\' 8"  (1.727 m)  Wt 180 lb (81.647 kg)  BMI 27.37 kg/m2  SpO2 97%  Review of Systems denies hypoglycemia    Objective:   Physical Exam VITAL SIGNS:  See vs page GENERAL: no distress Pulses: dorsalis pedis intact bilat.   Feet: no deformity.  no ulcer on the feet.  feet are of normal color and temp.  no edema Neuro: sensation is intact to touch on the feet  Lab Results  Component  Value Date   HGBA1C 8.6* 06/25/2011      Assessment & Plan:  DM.  needs increased rx

## 2011-07-17 NOTE — Progress Notes (Signed)
Chief complaint: Postoperative visit after drainage of mastectomy seroma  History of present illness: This patient had a mastectomy several weeks ago and has been doing chemotherapy but had a chronic seroma and then developed sepsis. However the seroma did not appear infected but I was concerned that it was a potential that her ongoing chemotherapy that we would have a significant potential for a bad infection so this was opened and drained and packed. She comes in for reevaluation today. The packing is being done by the Wound Care people.  Exam: Vital signs:BP 142/88  Pulse 88  Temp 97.2 F (36.2 C) (Temporal)  Resp 18  Ht 5\' 8"  (1.727 m)  Wt 181 lb (82.101 kg)  BMI 27.52 kg/m2  General: She feels much better today. Not as depressed - holding on chemo, started femara Wound: The flaps are now stuck down. The base was clean granulation tissue and is more shallow than it has been. There is no evidence of superficial infection currently.  Impression: Resolving wound problem  Plan: Will see in three weeks. She will continue the current wound care regimen

## 2011-07-17 NOTE — Telephone Encounter (Signed)
Will forward to SN as an FYI 

## 2011-07-21 ENCOUNTER — Ambulatory Visit
Admission: RE | Admit: 2011-07-21 | Discharge: 2011-07-21 | Disposition: A | Discharge status: Home / Self Care | Payer: Medicare Other | Source: Ambulatory Visit | Attending: Radiation Oncology | Admitting: Radiation Oncology

## 2011-07-21 ENCOUNTER — Encounter: Payer: Self-pay | Admitting: Radiation Oncology

## 2011-07-21 VITALS — BP 138/71 | HR 71 | Temp 98.1°F | Resp 20 | Wt 185.4 lb

## 2011-07-21 DIAGNOSIS — E669 Obesity, unspecified: Secondary | ICD-10-CM | POA: Insufficient documentation

## 2011-07-21 DIAGNOSIS — E7800 Pure hypercholesterolemia, unspecified: Secondary | ICD-10-CM | POA: Insufficient documentation

## 2011-07-21 DIAGNOSIS — I10 Essential (primary) hypertension: Secondary | ICD-10-CM | POA: Insufficient documentation

## 2011-07-21 DIAGNOSIS — E119 Type 2 diabetes mellitus without complications: Secondary | ICD-10-CM | POA: Insufficient documentation

## 2011-07-21 DIAGNOSIS — C50319 Malignant neoplasm of lower-inner quadrant of unspecified female breast: Secondary | ICD-10-CM

## 2011-07-21 DIAGNOSIS — Z809 Family history of malignant neoplasm, unspecified: Secondary | ICD-10-CM | POA: Insufficient documentation

## 2011-07-21 DIAGNOSIS — C773 Secondary and unspecified malignant neoplasm of axilla and upper limb lymph nodes: Secondary | ICD-10-CM | POA: Insufficient documentation

## 2011-07-21 DIAGNOSIS — L0390 Cellulitis, unspecified: Secondary | ICD-10-CM

## 2011-07-21 DIAGNOSIS — C50519 Malignant neoplasm of lower-outer quadrant of unspecified female breast: Secondary | ICD-10-CM | POA: Insufficient documentation

## 2011-07-21 DIAGNOSIS — Z79899 Other long term (current) drug therapy: Secondary | ICD-10-CM | POA: Insufficient documentation

## 2011-07-21 DIAGNOSIS — Z9010 Acquired absence of unspecified breast and nipple: Secondary | ICD-10-CM | POA: Insufficient documentation

## 2011-07-21 NOTE — Addendum Note (Signed)
Encounter addended by: Maryln Gottron, MD on: 07/21/2011  6:28 PM<BR>     Documentation filed: Notes Section

## 2011-07-21 NOTE — Progress Notes (Signed)
Please see the Nurse Progress Note in the MD Initial Consult Encounter for this patient. 

## 2011-07-21 NOTE — Progress Notes (Signed)
Pt currently has HH RN doing wet-to-dry saline dressings on left chest wall wound. Pt states it has decreased from "12 to 9 in past week". She states area is "tender, sore". Pt has FU w/Dr Jamey Ripa 08/07/11.

## 2011-07-21 NOTE — Progress Notes (Addendum)
Followup note  Diagnosis: Stage III A (T3, N2, M0 invasive lobular carcinoma of the left breast  History: The patient returns today for review and scheduling of her left chest wall/regional node irradiation in the management of her T3 N2 invasive lobular carcinoma of the left breast. I last saw the patient on February 27 her prior to initiation of chemotherapy under the direction of Dr. Welton Flakes. She had a PET scan to complete her staging workup and this was without evidence for metastatic disease. She will onto receive 4 cycles of FEC comminution chemotherapy starting 04/17/2011 and this was complicated by febrile neutropenia sepsis along with mastectomy site cellulitis and chest wall abscess requiring incision and drainage. She is visited by home health nurse a daily basis for wound care which includes with dry dressings along her mastectomy wound. She was started on letrozole hormone therapy and Dr. Welton Flakes may consider weekly Taxol following completion of left chest wall/regional node irradiation. The patient tells me that her wound measured 12 x 2.5 cm has decreased to 9 x 1.5 cm according to her home health nurse.  Physical examination: Alert and oriented 71 year old white female appearing her stated age. Vital signs: Wt Readings from Last 3 Encounters:  07/21/11 185 lb 6.4 oz (84.097 kg)  07/17/11 181 lb (82.101 kg)  07/17/11 180 lb (81.647 kg)   Temp Readings from Last 3 Encounters:  07/21/11 98.1 F (36.7 C) Oral  07/17/11 97.2 F (36.2 C) Temporal  07/17/11 97.7 F (36.5 C) Oral   BP Readings from Last 3 Encounters:  07/21/11 138/71  07/17/11 142/88  07/17/11 132/76   Pulse Readings from Last 3 Encounters:  07/21/11 71  07/17/11 88  07/17/11 108   Head and neck examination: Grossly unremarkable. Nodes: There is no palpable cervical, supraclavicular, or axillary lymphadenopathy. Chest: Left-sided mastectomy with packing along her Center mastectomy scar over a distance of  approximately 8 cm. The packing is not removed. No visible or palpable evidence for recurrent disease along her left chest wall. Abdomen without hepatomegaly. Extremities: Without edema.  Laboratory data: Lab Results  Component Value Date   WBC 5.2 07/14/2011   HGB 12.6 07/14/2011   HCT 37.8 07/14/2011   MCV 90.5 07/14/2011   PLT 388 07/14/2011    Impression: Stage IIIA (T3, N2, M0) invasive lobular carcinoma of the left breast. We spent some time discussing the rationale for post mastectomy radiation therapy but in view of her tumor size and nodal involvement. We discussed the potential acute and late toxicities of radiation therapy including both pulmonary and cardiac toxicity. I would consider treating her with deep inspiration/breath-hold technology she can cooperate to avoid cardiac irradiation. We obviously need to wait until her chest wall wound heals, and this may take another month. I will have her return for a skin check in approximately 2 weeks. Consent is signed today. I see that she schedule see Dr. Jamey Ripa again on July 5.  Plan: As discussed above. Two-week followup visit here in a followup visit with Dr. Jamey Ripa on July 5. She'll continue with letrozole in the interim.  30 minutes was spent face-to-face with the patient primarily counseling the patient.

## 2011-07-27 ENCOUNTER — Telehealth (INDEPENDENT_AMBULATORY_CARE_PROVIDER_SITE_OTHER): Payer: Self-pay | Admitting: General Surgery

## 2011-07-27 NOTE — Addendum Note (Signed)
Encounter addended by: Glennie Hawk, RN on: 07/27/2011 11:40 AM<BR>     Documentation filed: Charges VN

## 2011-07-27 NOTE — Telephone Encounter (Signed)
Lincoln Maxin, occupational therapist, calling to clarify orders for exercises to Lt arm.  Instructed to do active exercises as tolerated.

## 2011-08-03 ENCOUNTER — Telehealth: Payer: Self-pay | Admitting: Pulmonary Disease

## 2011-08-03 NOTE — Telephone Encounter (Signed)
Please advise SN thanks 

## 2011-08-04 NOTE — Telephone Encounter (Signed)
I spoke with [name] and is aware of okay for PT. She states she had read the order wrong and she is wanting a verbal for OT. I advised her this was fine. Nothing further was needed

## 2011-08-04 NOTE — Telephone Encounter (Signed)
Per SN---ok for verbal order for PT for this pt. thanks

## 2011-08-07 ENCOUNTER — Ambulatory Visit (INDEPENDENT_AMBULATORY_CARE_PROVIDER_SITE_OTHER): Payer: Medicare Other | Admitting: Surgery

## 2011-08-07 ENCOUNTER — Encounter (INDEPENDENT_AMBULATORY_CARE_PROVIDER_SITE_OTHER): Payer: Self-pay | Admitting: Surgery

## 2011-08-07 VITALS — BP 152/90 | HR 84 | Temp 97.8°F | Resp 18 | Ht 68.0 in | Wt 183.6 lb

## 2011-08-07 DIAGNOSIS — Z09 Encounter for follow-up examination after completed treatment for conditions other than malignant neoplasm: Secondary | ICD-10-CM

## 2011-08-07 NOTE — Patient Instructions (Signed)
See me again in about 2 weeks 

## 2011-08-07 NOTE — Progress Notes (Signed)
Patient comes back for a wound check. We had to leave her mastectomy wound open after draining a large seroma. She's been having daily wound dressing changes at home and feels like this is improving. She likely will start radiation therapy soon.  Exam: The area continues to close down. The medial aspect has some hypertrophic granulations I touch this is a silver nitrate. I think the remainder will go ahead and granulating in.  Impression gradual improvement  Plan: Continue with local wound care and see back in 2 weeks.

## 2011-08-11 ENCOUNTER — Ambulatory Visit: Payer: Medicare Other | Admitting: Radiation Oncology

## 2011-08-11 ENCOUNTER — Ambulatory Visit: Payer: Medicare Other

## 2011-08-13 ENCOUNTER — Telehealth: Payer: Self-pay | Admitting: Pulmonary Disease

## 2011-08-13 MED ORDER — PHENAZOPYRIDINE HCL 200 MG PO TABS: 200.0000 mg | ORAL_TABLET | Freq: Three times a day (TID) | ORAL | Status: AC | PRN

## 2011-08-13 NOTE — Telephone Encounter (Signed)
SN requested that i call and speak with the pt to see if she needed anything.  Pt stated that she had an rx for cipro and has been taking that for the UTI and azo tablets but these are not helping, pt is still having a lot of burning with urination.  She is requesting that pyridium be [facility] [address] to help with this.  SN please advise.

## 2011-08-13 NOTE — Addendum Note (Signed)
Addended by: Marcellus Scott on: 08/13/2011 02:51 PM   Modules accepted: Orders

## 2011-08-13 NOTE — Telephone Encounter (Signed)
I spoke with Jasmine December and she states pt has missed 2 visits. Pt had a funeral one day and this week she has a UTI so she didn't feel well today. I advised will forward to SN as an [place]

## 2011-08-13 NOTE — Telephone Encounter (Signed)
Per SN---ok to send in the pyridium 200mg   1 po tid prn pain x 2 days. This has been sent to the pharmacy for the pt and the pt is aware.

## 2011-08-18 ENCOUNTER — Telehealth: Payer: Self-pay | Admitting: Pulmonary Disease

## 2011-08-18 ENCOUNTER — Telehealth (INDEPENDENT_AMBULATORY_CARE_PROVIDER_SITE_OTHER): Payer: Self-pay | Admitting: General Surgery

## 2011-08-18 NOTE — Telephone Encounter (Signed)
Called, spoke with Victorino Dike - pt's home health nurse with [employer] - who states there is a potential interaction with cipro and decadron.  Together, the two have potential interaction of tendonitis and tendon rupture.  Victorino Dike reports pt was educated on the signs and symptoms of these problems.  Per Victorino Dike, pt has been taking the cipro since Thursday or Friday and is doing fine at this point.  Will forward msg to Dr. Kriste Basque so he is aware.

## 2011-08-18 NOTE — Telephone Encounter (Signed)
Patient will be out of town for a couple days per home health nurse if we need to get in touch with her. She will keep appt on Friday.

## 2011-08-18 NOTE — Telephone Encounter (Signed)
Nurse is aware.Carron Curie, CMA

## 2011-08-18 NOTE — Telephone Encounter (Signed)
Per SN-we are aware; Use the cipro please; continue the decadron as directed.

## 2011-08-24 ENCOUNTER — Telehealth (INDEPENDENT_AMBULATORY_CARE_PROVIDER_SITE_OTHER): Payer: Self-pay | Admitting: General Surgery

## 2011-08-24 NOTE — Telephone Encounter (Signed)
Called patient's home number with no answer and no way to leave message. Left message on her mobile number with appt date/time. Appt made 08/25/11 @ 2:30 pm. Patient is to call back if this is not a good date/time.

## 2011-08-24 NOTE — Telephone Encounter (Signed)
Message copied by Liliana Cline on Mon Aug 24, 2011  8:41 AM ------      Message from: Matilde Sprang      Created: Fri Aug 21, 2011  8:39 AM      Regarding: needs appt      Contact: [phone]       Pt was under the impression she had an appt 08/21/11, but found out Dr. Jamey Ripa not in the office today.  She still has an open wound to check.  Can she be worked in one day next week?  Thanks, bp

## 2011-08-25 ENCOUNTER — Encounter (INDEPENDENT_AMBULATORY_CARE_PROVIDER_SITE_OTHER): Payer: Self-pay | Admitting: Surgery

## 2011-08-25 ENCOUNTER — Ambulatory Visit (INDEPENDENT_AMBULATORY_CARE_PROVIDER_SITE_OTHER): Payer: Medicare Other | Admitting: Surgery

## 2011-08-25 ENCOUNTER — Telehealth (INDEPENDENT_AMBULATORY_CARE_PROVIDER_SITE_OTHER): Payer: Self-pay

## 2011-08-25 VITALS — BP 125/79 | HR 80 | Temp 97.8°F | Resp 14 | Ht 68.0 in | Wt 183.6 lb

## 2011-08-25 DIAGNOSIS — Z09 Encounter for follow-up examination after completed treatment for conditions other than malignant neoplasm: Secondary | ICD-10-CM

## 2011-08-25 NOTE — Telephone Encounter (Signed)
Home health nurse called for verification of visits.  Notified by Lesly Rubenstein that home health was no longer needed.

## 2011-08-25 NOTE — Patient Instructions (Signed)
See me in a few months, but in the next 10 days if the incision does not completely heal

## 2011-08-25 NOTE — Progress Notes (Signed)
Patient comes back for a wound check. We had to leave her mastectomy wound open after draining a large seroma. She's been having daily wound dressing changes at home and feels like this is improving. She likely will start radiation therapy soon.  Exam: the area is closed but a small focus of hypertrophic granulations  Impression almost healed  Plan: Touched with AgNO3 should heal completely with this and should be OK for radiation next week

## 2011-08-28 ENCOUNTER — Ambulatory Visit (INDEPENDENT_AMBULATORY_CARE_PROVIDER_SITE_OTHER): Payer: Medicare Other | Admitting: Endocrinology

## 2011-08-28 ENCOUNTER — Encounter: Payer: Self-pay | Admitting: Endocrinology

## 2011-08-28 VITALS — BP 132/68 | HR 87 | Temp 98.2°F | Ht 68.0 in | Wt 185.0 lb

## 2011-08-28 DIAGNOSIS — E119 Type 2 diabetes mellitus without complications: Secondary | ICD-10-CM

## 2011-08-28 NOTE — Progress Notes (Signed)
Subjective:    Patient ID: [name], female    DOB: 1940/10/08, 71 y.o.   MRN: [id number]  HPI Pt returns for f/u of type 2 DM (dx'ed 2007; no known complications).  Pt says cbg's vary from 108-190.  She is soon to do XRT, then back to chemotx.  She has increased the lantus to 25 units qd, only.    Past Medical History  Diagnosis Date  . DIABETES MELLITUS 08/10/2007  . 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 HYPERCHOLESTEROLEMIA 12/17/2006  . OBESITY 06/04/2008  . ANXIETY 06/04/2008  . DEPRESSION 08/10/2007  . HYPERTENSION 12/17/2006  . ASTHMATIC BRONCHITIS, ACUTE 06/04/2008  . ALLERGIC RHINITIS 12/13/2007  . GERD 12/17/2006  . DEGENERATIVE JOINT DISEASE 12/17/2006  . Asthma   . Blood transfusion 1964    post childbirth  . 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 History of chemotherapy     Past Surgical History  Procedure Date  . Laminectomy [id number]    s/p-Dr. Jule Ser  . Bladder tack 1974  . Cholecystectomy 02/23/1991    LC - Dr Jamey Ripa  . Tonsillectomy   . Rotator cuff repair 8/03    right, Dr. Shelle Iron  . Enterocele repair 06/08    Dr. Estanislado Pandy  . Rotator cuff repair 10/26/2008    left, Dr. Rayburn Ma  . Hip surgery 08/27/2010    DR. Despina Hick  . Breast excisional biopsy 11/10/1988    left benign Dr Jamey Ripa  . Breast excisional biopsy 03/04/1983    Right - two areas - Dr Jamey Ripa  . Breast excisional biopsy 10/09/1980    right - Dr Jamey Ripa  . Breast excisional biopsy 10/18/1979    left - Dr Jamey Ripa  . Breast excisional biopsy 01/02/1994    right - Dr Jamey Ripa  . Vaginal hysterectomy 1974    partial   . Colonoscopy   . Modified radical mastectomy w/ axillary lymph node dissection     03-30-11 LT  . Portacath placement 04/13/2011    Procedure: INSERTION PORT-A-CATH;  Surgeon: Currie Paris, MD;  Location: [facility];  Service: General;  Laterality: N/A;  porta cath placement,removal of J-P drain  . Mastectomy 03/23/2011    left  . Drain seroma 05/01/11     Chronic seroma @mastectomy  site  . Evacuation breast hematoma 07/01/2011    Procedure: EVACUATION HEMATOMA BREAST;  Surgeon: Currie Paris, MD;  Location: [facility];  Service: General;  Laterality: Left;  Drainage of left mastectomy seroma    History   Social History  . Marital Status: Married    Spouse Name: N/A    Number of Children: 3  . Years of Education: N/A   Occupational History  .     Social History Main Topics  . Smoking status: Never Smoker   . Smokeless tobacco: Never Used  . Alcohol Use: No  . Drug Use: No  . Sexually Active: Not Currently   Other Topics Concern  . 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 acyclovir (ZOVIRAX) 200 MG capsule Take 2 capsules (400 mg total) by mouth every 6 (six) hours. While awake.  60 capsule  3  . amLODipine (NORVASC) 10 MG tablet Take 1 tablet (10 mg total) by mouth daily.  30 tablet  11  . atorvastatin (LIPITOR) 40 MG tablet Take 1 tablet (40 mg total) by mouth daily.  30 tablet  11  . benazepril (LOTENSIN) 40 MG tablet Take 1 tablet (40 mg total) by mouth daily.  30 tablet  11  . Cholecalciferol (VITAMIN D) 2000 UNITS tablet Take 2,000 Units by mouth daily.        . Insulin Glargine (LANTUS SOLOSTAR [place]) Inject 25 Units into the skin daily.       Marland Kitchen letrozole (FEMARA) 2.5 MG tablet       . lidocaine-prilocaine (EMLA) cream Apply 1 application topically as needed. For pain      . omeprazole (PRILOSEC) 20 MG capsule Take 1 capsule (20 mg total) by mouth daily.  30 capsule  11  . PRESCRIPTION MEDICATION Inject 208 mg into the vein See admin instructions. epirubicin (ELLENCE) chemo injection 208 mg 100 mg/m2  2.07 m2 (Treatment Plan Actual)  Once 06/18/2011   Route:  Intravenous      . PRESCRIPTION  MEDICATION Inject 1,050 mg into the vein See admin instructions. Fluorouracil (ADRUCIL) chemo injection 1,050 mg 500 mg/m2  2.07 m2 (Treatment Plan Actual)  Once 06/18/2011 Route: intravenous      . PRESCRIPTION MEDICATION Inject 1,040 mg into the vein See admin instructions. cyclophosphamide (CYTOXAN) 1,040 mg in sodium chloride 0.9 % 250 mL chemo infusion  06/18/2011 Rout: Intravenous      . sertraline (ZOLOFT) 50 MG tablet Take 1 tablet (50 mg total) by mouth daily.  30 tablet  5  . DISCONTD: prochlorperazine (COMPAZINE) 10 MG tablet Take 1 tablet (10 mg total) by mouth every 6 (six) hours as needed (Nausea or vomiting).  30 tablet  1    Allergies  Allergen Reactions  . Ceftriaxone Sodium     REACTION: hives  . Cephalexin     REACTION: hives  . Clindamycin     REACTION: hives  . Dilaudid (Hydromorphone Hcl) Itching  . Oxycodone-Acetaminophen     REACTION: hives  . Rocephin (Ceftriaxone Sodium In Dextrose) Hives  . Sulfonamide Derivatives     REACTION: hives    Family History  Problem Relation Age of Onset  . COPD Mother   . Arthritis Mother   . Emphysema Mother   . Mental illness Paternal Grandfather   . Heart disease Other     Sibling  . Diabetes Other     Sibling   . Diabetes Brother   . Cancer Brother     prostate  . Cancer Father     malignant brain tumor   BP 132/68  Pulse 87  Temp 98.2 F (36.8 C) (Oral)  Ht 5\' 8"  (1.727 m)  Wt 185 lb (83.915 kg)  BMI 28.13 kg/m2  SpO2 97%  Review of Systems denies hypoglycemia.      Objective:   Physical Exam VITAL SIGNS:  See vs page GENERAL: no distress. NECK: There is no palpable thyroid enlargement.  No thyroid nodule is palpable.  No palpable lymphadenopathy at the anterior neck.       Assessment & Plan:  DM, with improved control.  i'm ok with continuing this insulin schedule for now, but she will probably end up needing something different.

## 2011-08-28 NOTE — Patient Instructions (Addendum)
check your blood sugar 2 times a day.  vary the time of day when you check, between before the 3 meals, after meals, and at bedtime.  also check if you have symptoms of your blood sugar being too high or too low.  please keep a record of the readings and bring it to your next appointment here.  please call us sooner if your blood sugar goes below 70, or if it stays over 200. Increase lantus to 25 units per day.   Please come back for a follow-up appointment for 1 month, with blood tests a few days ahead of time.

## 2011-09-01 ENCOUNTER — Ambulatory Visit
Admission: RE | Admit: 2011-09-01 | Discharge: 2011-09-01 | Disposition: A | Discharge status: Home / Self Care | Payer: Medicare Other | Source: Ambulatory Visit | Attending: Radiation Oncology | Admitting: Radiation Oncology

## 2011-09-01 ENCOUNTER — Encounter: Payer: Self-pay | Admitting: Radiation Oncology

## 2011-09-01 VITALS — BP 123/77 | HR 60 | Temp 97.6°F | Resp 20 | Wt 188.0 lb

## 2011-09-01 DIAGNOSIS — C50319 Malignant neoplasm of lower-inner quadrant of unspecified female breast: Secondary | ICD-10-CM

## 2011-09-01 NOTE — Progress Notes (Signed)
Please see the Nurse Progress Note in the MD Initial Consult Encounter for this patient. 

## 2011-09-01 NOTE — Progress Notes (Signed)
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  [name] visits today for review and scheduling of her radiation therapy. I last saw her for a followup visit on 07/21/2011 at which time she had an 8 cm area of granulating tissue along her left mastectomy scar. She is being cared for by Dr. Jamey Ripa.  Physical examination: Alert and oriented. Filed Vitals:   09/01/11 1443  BP: 123/77  Pulse: 60  Temp: 97.6 F (36.4 C)  Resp: 20   Nodes: Without palpable cervical, supraclavicular, or axillary lymphadenopathy. Chest on inspection of the left chest wall there is a 1 cm area of linear granulation tissue centrally him in just lateral to this is superficial ulceration measure 1 cm. She is almost healed. No visible or palpable evidence for recurrent disease.  Impression: Satisfactory progress. I think she should be completely healed within 1-2 weeks. I again discussed the potential acute and late toxicities of radiation therapy and she wishes to proceed as outlined. Consent is signed today.  Plan: I will have her return for simulation/treatment planning next week, but only start her treatment after she has completely healed.  15 minutes was spent face-to-face with the patient, primarily counseling the patient and coordinating her care.

## 2011-09-01 NOTE — Progress Notes (Signed)
Pt has very small area redness on left chest wall, states it has very scant yellow/clear drainage. Pt saw Dr Jamey Ripa last Friday, had silver nitrate applied to area. She c/o discomfort, throbbing in left chest area which is mild. She states "it is not heart related".

## 2011-09-04 NOTE — Addendum Note (Signed)
Encounter addended by: Glennie Hawk, RN on: 09/04/2011  9:29 AM<BR>     Documentation filed: Charges VN

## 2011-09-07 ENCOUNTER — Other Ambulatory Visit: Payer: Self-pay | Admitting: Radiation Oncology

## 2011-09-07 ENCOUNTER — Telehealth: Payer: Self-pay | Admitting: Radiation Oncology

## 2011-09-07 ENCOUNTER — Ambulatory Visit: Admission: RE | Admit: 2011-09-07 | Payer: Medicare Other | Source: Ambulatory Visit | Admitting: Radiation Oncology

## 2011-09-07 ENCOUNTER — Ambulatory Visit: Admission: RE | Admit: 2011-09-07 | Payer: Medicare Other | Source: Ambulatory Visit

## 2011-09-07 DIAGNOSIS — M7500 Adhesive capsulitis of unspecified shoulder: Secondary | ICD-10-CM

## 2011-09-07 DIAGNOSIS — C50319 Malignant neoplasm of lower-inner quadrant of unspecified female breast: Secondary | ICD-10-CM

## 2011-09-07 NOTE — Progress Notes (Addendum)
Chart note: [name] was scheduled for simulation/treatment planning in the management of her stage IIIa (T3, N2, M0) invasive lobular carcinoma of the left breast. At the time of simulation today she had difficulty with left shoulder extension. She tells me that she did have home health physical therapy, but I'm not sure how compliant she was. On examination today she can extend her left shoulder to no more than 90. Ideally, I would like for her to have at least 120 so we can minimize both lung and cardiac irradiation with deep inspiration/breath-hold technology. I am getting her back to [facility] rehabilitation for intensive physical therapy, and then having her return here for simulation/treatment planning the week of August 19.

## 2011-09-07 NOTE — Telephone Encounter (Signed)
Pt was referred by for FA, due to sobbing on the phone, advising she cannot afford her tx bills. I spoke with patient today and told her I will be sending her EPP & MCD application process via mail, as well as, will process the [facility] and [facility] referral. I have also, sent an email to the onsite SW's for addl asst in this matter.   Pt said she will bring all the necessary paperwork and verification information needed  when she has her appt w/Dr, Dayton Scrape on the 20th.  Dx:  174.8 Other specified sites of female breast  Attending Rad: Dr. Dayton Scrape  Insurance: [facility]

## 2011-09-08 ENCOUNTER — Ambulatory Visit: Payer: Medicare Other | Admitting: Radiation Oncology

## 2011-09-10 ENCOUNTER — Ambulatory Visit: Payer: Medicare Other | Attending: Radiation Oncology | Admitting: Physical Therapy

## 2011-09-10 DIAGNOSIS — M24519 Contracture, unspecified shoulder: Secondary | ICD-10-CM | POA: Insufficient documentation

## 2011-09-10 DIAGNOSIS — IMO0001 Reserved for inherently not codable concepts without codable children: Secondary | ICD-10-CM | POA: Insufficient documentation

## 2011-09-10 DIAGNOSIS — M25619 Stiffness of unspecified shoulder, not elsewhere classified: Secondary | ICD-10-CM | POA: Insufficient documentation

## 2011-09-11 ENCOUNTER — Ambulatory Visit: Payer: Medicare Other | Admitting: Physical Therapy

## 2011-09-14 ENCOUNTER — Ambulatory Visit: Payer: Medicare Other

## 2011-09-14 ENCOUNTER — Ambulatory Visit: Payer: Medicare Other | Admitting: Physical Therapy

## 2011-09-15 ENCOUNTER — Other Ambulatory Visit: Payer: Self-pay | Admitting: Pulmonary Disease

## 2011-09-15 ENCOUNTER — Ambulatory Visit: Payer: Medicare Other

## 2011-09-16 ENCOUNTER — Ambulatory Visit: Payer: Medicare Other

## 2011-09-16 ENCOUNTER — Ambulatory Visit: Payer: Medicare Other | Admitting: Physical Therapy

## 2011-09-17 ENCOUNTER — Encounter: Payer: Medicare Other | Admitting: Physical Therapy

## 2011-09-17 ENCOUNTER — Ambulatory Visit: Payer: Medicare Other

## 2011-09-18 ENCOUNTER — Ambulatory Visit: Payer: Medicare Other

## 2011-09-21 ENCOUNTER — Ambulatory Visit: Payer: Medicare Other

## 2011-09-22 ENCOUNTER — Telehealth (INDEPENDENT_AMBULATORY_CARE_PROVIDER_SITE_OTHER): Payer: Self-pay | Admitting: General Surgery

## 2011-09-22 ENCOUNTER — Ambulatory Visit: Admission: RE | Admit: 2011-09-22 | Payer: Medicare Other | Source: Ambulatory Visit | Admitting: Radiation Oncology

## 2011-09-22 ENCOUNTER — Encounter (INDEPENDENT_AMBULATORY_CARE_PROVIDER_SITE_OTHER): Payer: Self-pay | Admitting: Surgery

## 2011-09-22 ENCOUNTER — Ambulatory Visit: Payer: Medicare Other

## 2011-09-22 ENCOUNTER — Ambulatory Visit (INDEPENDENT_AMBULATORY_CARE_PROVIDER_SITE_OTHER): Payer: Medicare Other | Admitting: Surgery

## 2011-09-22 VITALS — BP 134/80 | HR 96 | Temp 97.3°F | Ht 68.0 in | Wt 188.8 lb

## 2011-09-22 DIAGNOSIS — Z9889 Other specified postprocedural states: Secondary | ICD-10-CM

## 2011-09-22 NOTE — Patient Instructions (Signed)
See me again after you finish radiation 

## 2011-09-22 NOTE — Progress Notes (Signed)
Chief complaint: Granulation tissue  History of present illness: This patient status post mastectomy and had difficulty  Getting an completely healed. She comes back today because the small area of granulation tissue it hasn't completely dried up.  Exam: There is a 1 x 3 mm area of granulations in the middle portion of the mastectomy site. Is otherwise healed although she does have some dry skin on either side of the scar.  Impression: Hypertrophic granulations  Plan: Applied some silver nitrate. If this doesn't completely heal she come back for another treatment. Otherwise I will see her at the end of radiation therapy.

## 2011-09-22 NOTE — Telephone Encounter (Signed)
LMOM asking pt to return my call. This is in regards to me making her an appt to see Dr. Jamey Ripa today at 1:50.

## 2011-09-22 NOTE — Telephone Encounter (Signed)
Informed pt of her appt today with Dr. Jamey Ripa.

## 2011-09-23 ENCOUNTER — Ambulatory Visit: Payer: Medicare Other

## 2011-09-24 ENCOUNTER — Encounter: Payer: Medicare Other | Admitting: Physical Therapy

## 2011-09-24 ENCOUNTER — Ambulatory Visit: Payer: Medicare Other

## 2011-09-24 ENCOUNTER — Ambulatory Visit: Payer: Medicare Other | Admitting: Physical Therapy

## 2011-09-25 ENCOUNTER — Ambulatory Visit: Payer: Medicare Other | Admitting: Physical Therapy

## 2011-09-25 ENCOUNTER — Ambulatory Visit: Payer: Medicare Other

## 2011-09-28 ENCOUNTER — Ambulatory Visit: Payer: Medicare Other

## 2011-09-28 ENCOUNTER — Ambulatory Visit (INDEPENDENT_AMBULATORY_CARE_PROVIDER_SITE_OTHER): Payer: Medicare Other | Admitting: Endocrinology

## 2011-09-28 ENCOUNTER — Other Ambulatory Visit (INDEPENDENT_AMBULATORY_CARE_PROVIDER_SITE_OTHER): Payer: Medicare Other

## 2011-09-28 ENCOUNTER — Ambulatory Visit
Admission: RE | Admit: 2011-09-28 | Discharge: 2011-09-28 | Disposition: A | Discharge status: Home / Self Care | Payer: Medicare Other | Source: Ambulatory Visit | Attending: Radiation Oncology | Admitting: Radiation Oncology

## 2011-09-28 ENCOUNTER — Ambulatory Visit: Payer: Medicare Other | Admitting: Physical Therapy

## 2011-09-28 DIAGNOSIS — E119 Type 2 diabetes mellitus without complications: Secondary | ICD-10-CM

## 2011-09-28 DIAGNOSIS — C50319 Malignant neoplasm of lower-inner quadrant of unspecified female breast: Secondary | ICD-10-CM

## 2011-09-28 LAB — HEMOGLOBIN A1C: Hgb A1c MFr Bld: 8.4 % — ABNORMAL HIGH (ref 4.6–6.5)

## 2011-09-28 NOTE — Patient Instructions (Addendum)
check your blood sugar 2 times a day.  vary the time of day when you check, between before the 3 meals, after meals, and at bedtime.  also check if you have symptoms of your blood sugar being too high or too low.  please keep a record of the readings and bring it to your next appointment here.  please call us sooner if your blood sugar goes below 70, or if it stays over 200.   Increase lantus to 30 units per day.   Please come back for a follow-up appointment in 2 months.

## 2011-09-28 NOTE — Progress Notes (Signed)
Simulation/treatment planning note:  The patient was taken to the CT simulator. A custom neck mold was placed on a breast board for immobilization. She had a 1-2 mm area of hypertrophic granulation along the central mastectomy scar, much improved compared to last week. Her field borders were marked with radiopaque wires. Her mastectomy scar was also marked with a radiopaque wire. She was then scanned, free breathing. An isocenter was chosen. She was then scanned with breath-hold and is not felt to be a significant benefit for breath-hold with respect to cardiac irradiation. She was set up to medial lateral left chest wall tangents with 2 separate and unique multileaf collimators. She was then set up RAO to her left supraclavicular/axillary region and a separate multileaf, was designed. Lastly, she was set up PA to her left axilla and a fourth multileaf collimator was designed for a total of 5 complex treatment devices. I prescribing 5040 cGy in 20 sessions to her left chest wall with 1.0 cm custom bolus constructed in apply to her skin on the first day of her treatment. I'm also prescribing 5040 cGy to her left axilla/supraclavicular region, doses prescribed at 3 cm depth. The PA field to be utilized to bring the mid axillary dose up to 4600 centigrade 28 sessions. Following her photon radiation she will have a left mastectomy scar/chest wall boost done on the treatment unit to deliver a further 1000 cGy in 5 sessions. Dosimetry and isodose plan are requested.

## 2011-09-28 NOTE — Progress Notes (Signed)
Subjective:    Patient ID: [name], female    DOB: 09-Nov-1940, 71 y.o.   MRN: [id number]  HPI Pt returns for f/u of type 2 DM (dx'ed 2007; no known complications).  no cbg record, but states cbg's vary from 130-180.  There is no trend throughout the day.  She is soon to do XRT, then she goes back to chemotx.  She has increased the lantus to 25 units qd, only. Past Medical History  Diagnosis Date  . DIABETES MELLITUS 08/10/2007  . 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 HYPERCHOLESTEROLEMIA 12/17/2006  . OBESITY 06/04/2008  . ANXIETY 06/04/2008  . DEPRESSION 08/10/2007  . HYPERTENSION 12/17/2006  . ASTHMATIC BRONCHITIS, ACUTE 06/04/2008  . ALLERGIC RHINITIS 12/13/2007  . GERD 12/17/2006  . DEGENERATIVE JOINT DISEASE 12/17/2006  . Asthma   . Blood transfusion 1964    post childbirth  . 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 History of chemotherapy     Past Surgical History  Procedure Date  . Laminectomy 1993    s/p-Dr. Jule Ser  . Bladder tack 1974  . Cholecystectomy 02/23/1991    LC - Dr Jamey Ripa  . Tonsillectomy   . Rotator cuff repair 8/03    right, Dr. Shelle Iron  . Enterocele repair 06/08    Dr. Estanislado Pandy  . Rotator cuff repair 10/26/2008    left, Dr. Rayburn Ma  . Hip surgery 08/27/2010    DR. Despina Hick  . Breast excisional biopsy 11/10/1988    left benign Dr Jamey Ripa  . Breast excisional biopsy 03/04/1983    Right - two areas - Dr Jamey Ripa  . Breast excisional biopsy 10/09/1980    right - Dr Jamey Ripa  . Breast excisional biopsy 10/18/1979    left - Dr Jamey Ripa  . Breast excisional biopsy 01/02/1994    right - Dr Jamey Ripa  . Vaginal hysterectomy 1974    partial   . Colonoscopy   . Modified radical mastectomy w/ axillary lymph node dissection     03-30-11 LT  . Portacath placement 04/13/2011    Procedure: INSERTION PORT-A-CATH;  Surgeon: Currie Paris, MD;  Location: [facility];  Service: General;  Laterality: N/A;  porta cath  placement,removal of J-P drain  . Mastectomy 03/23/2011    left  . Drain seroma 05/01/11    Chronic seroma @mastectomy  site  . Evacuation breast hematoma 07/01/2011    Procedure: EVACUATION HEMATOMA BREAST;  Surgeon: Currie Paris, MD;  Location: [facility];  Service: General;  Laterality: Left;  Drainage of left mastectomy seroma  . Breast surgery     History   Social History  . Marital Status: Married    Spouse Name: N/A    Number of Children: 3  . Years of Education: N/A   Occupational History  .     Social History Main Topics  . Smoking status: Never Smoker   . Smokeless tobacco: Never Used  . Alcohol Use: No  . Drug Use: No  . Sexually Active: Not Currently   Other Topics Concern  . 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 acyclovir (ZOVIRAX) 200 MG capsule Take 2 capsules (400 mg total) by mouth every 6 (six) hours. While awake.  60  capsule  3  . ALPRAZolam (XANAX) 0.5 MG tablet TAKE ONE-HALF TO ONE TABLET BY MOUTH THREE TIMES DAILY AS NEEDED FOR  NERVES  90 tablet  1  . amLODipine (NORVASC) 10 MG tablet Take 1 tablet (10 mg total) by mouth daily.  30 tablet  11  . atorvastatin (LIPITOR) 40 MG tablet Take 1 tablet (40 mg total) by mouth daily.  30 tablet  11  . benazepril (LOTENSIN) 40 MG tablet Take 1 tablet (40 mg total) by mouth daily.  30 tablet  11  . Cholecalciferol (VITAMIN D) 2000 UNITS tablet Take 2,000 Units by mouth daily.        . Insulin Glargine (LANTUS SOLOSTAR ) Inject 30 Units into the skin daily.       Marland Kitchen letrozole (FEMARA) 2.5 MG tablet       . lidocaine-prilocaine (EMLA) cream Apply 1 application topically as needed. For pain      . omeprazole (PRILOSEC) 20 MG capsule Take 1 capsule (20 mg total) by mouth daily.  30  capsule  11  . PRESCRIPTION MEDICATION Inject 208 mg into the vein See admin instructions. epirubicin (ELLENCE) chemo injection 208 mg 100 mg/m2  2.07 m2 (Treatment Plan Actual)  Once 06/18/2011   Route:  Intravenous      . PRESCRIPTION MEDICATION Inject 1,050 mg into the vein See admin instructions. Fluorouracil (ADRUCIL) chemo injection 1,050 mg 500 mg/m2  2.07 m2 (Treatment Plan Actual)  Once 06/18/2011 Route: intravenous      . PRESCRIPTION MEDICATION Inject 1,040 mg into the vein See admin instructions. cyclophosphamide (CYTOXAN) 1,040 mg in sodium chloride 0.9 % 250 mL chemo infusion  06/18/2011 Rout: Intravenous      . sertraline (ZOLOFT) 50 MG tablet Take 1 tablet (50 mg total) by mouth daily.  30 tablet  5  . DISCONTD: prochlorperazine (COMPAZINE) 10 MG tablet Take 1 tablet (10 mg total) by mouth every 6 (six) hours as needed (Nausea or vomiting).  30 tablet  1    Allergies  Allergen Reactions  . Ceftriaxone Sodium     REACTION: hives  . Cephalexin     REACTION: hives  . Clindamycin     REACTION: hives  . Dilaudid (Hydromorphone Hcl) Itching  . Oxycodone-Acetaminophen     REACTION: hives  . Rocephin (Ceftriaxone Sodium In Dextrose) Hives  . Sulfonamide Derivatives     REACTION: hives    Family History  Problem Relation Age of Onset  . COPD Mother   . Arthritis Mother   . Emphysema Mother   . Mental illness Paternal Grandfather   . Heart disease Other     Sibling  . Diabetes Other     Sibling   . Diabetes Brother   . Cancer Brother     prostate  . Cancer Father     malignant brain tumor    There were no vitals taken for this visit.    Review of Systems denies hypoglycemia    Objective:   Physical Exam VITAL SIGNS:  See vs page GENERAL: no distress.   PSYCH: Alert and oriented x 3.  Does not appear anxious nor depressed.   Lab Results  Component Value Date   HGBA1C 8.4* 09/28/2011      Assessment & Plan:  DM: needs increased rx

## 2011-09-29 ENCOUNTER — Ambulatory Visit: Payer: Medicare Other

## 2011-09-30 ENCOUNTER — Ambulatory Visit: Payer: Medicare Other

## 2011-10-01 ENCOUNTER — Ambulatory Visit: Payer: Medicare Other

## 2011-10-01 ENCOUNTER — Encounter: Payer: Medicare Other | Admitting: Physical Therapy

## 2011-10-02 ENCOUNTER — Ambulatory Visit: Payer: Medicare Other

## 2011-10-06 ENCOUNTER — Ambulatory Visit: Payer: Medicare Other

## 2011-10-06 ENCOUNTER — Ambulatory Visit
Admission: RE | Admit: 2011-10-06 | Discharge: 2011-10-06 | Disposition: A | Discharge status: Home / Self Care | Payer: Medicare Other | Source: Ambulatory Visit | Attending: Radiation Oncology | Admitting: Radiation Oncology

## 2011-10-06 DIAGNOSIS — C50319 Malignant neoplasm of lower-inner quadrant of unspecified female breast: Secondary | ICD-10-CM

## 2011-10-06 NOTE — Progress Notes (Signed)
Simulation verification note: The patient underwent simulation verification for treatment to her left chest wall and regional lymph nodes. Her isocenter is in good position and the multileaf collimators contoured the treatment volume appropriately. 

## 2011-10-07 ENCOUNTER — Ambulatory Visit
Admission: RE | Admit: 2011-10-07 | Discharge: 2011-10-07 | Disposition: A | Discharge status: Home / Self Care | Payer: Medicare Other | Source: Ambulatory Visit | Attending: Radiation Oncology | Admitting: Radiation Oncology

## 2011-10-07 ENCOUNTER — Ambulatory Visit: Payer: Medicare Other

## 2011-10-07 DIAGNOSIS — C50319 Malignant neoplasm of lower-inner quadrant of unspecified female breast: Secondary | ICD-10-CM

## 2011-10-07 MED ORDER — ALRA NON-METALLIC DEODORANT (RAD-ONC)
1.0000 "application " | Freq: Once | TOPICAL | Status: AC
  Administered 2011-10-07: 1 via TOPICAL

## 2011-10-07 MED ORDER — RADIAPLEXRX EX GEL
Freq: Once | CUTANEOUS | Status: AC
  Administered 2011-10-07: 10:00:00 via TOPICAL

## 2011-10-07 MED ORDER — RADIAPLEXRX EX GEL: Freq: Once | CUTANEOUS | Status: DC

## 2011-10-07 MED ORDER — ALRA NON-METALLIC DEODORANT (RAD-ONC): 1.0000 "application " | Freq: Once | TOPICAL | Status: DC

## 2011-10-07 NOTE — Progress Notes (Signed)
Post sim teaching, done

## 2011-10-08 ENCOUNTER — Ambulatory Visit: Payer: Medicare Other

## 2011-10-08 ENCOUNTER — Ambulatory Visit
Admission: RE | Admit: 2011-10-08 | Discharge: 2011-10-08 | Disposition: A | Discharge status: Home / Self Care | Payer: Medicare Other | Source: Ambulatory Visit | Attending: Radiation Oncology | Admitting: Radiation Oncology

## 2011-10-08 ENCOUNTER — Encounter: Payer: Medicare Other | Admitting: Physical Therapy

## 2011-10-09 ENCOUNTER — Ambulatory Visit: Payer: Medicare Other

## 2011-10-09 ENCOUNTER — Ambulatory Visit
Admission: RE | Admit: 2011-10-09 | Discharge: 2011-10-09 | Disposition: A | Discharge status: Home / Self Care | Payer: Medicare Other | Source: Ambulatory Visit | Attending: Radiation Oncology | Admitting: Radiation Oncology

## 2011-10-12 ENCOUNTER — Ambulatory Visit: Payer: Medicare Other

## 2011-10-12 ENCOUNTER — Encounter: Payer: Self-pay | Admitting: Radiation Oncology

## 2011-10-12 ENCOUNTER — Ambulatory Visit
Admission: RE | Admit: 2011-10-12 | Discharge: 2011-10-12 | Disposition: A | Discharge status: Home / Self Care | Payer: Medicare Other | Source: Ambulatory Visit | Attending: Radiation Oncology | Admitting: Radiation Oncology

## 2011-10-12 VITALS — BP 149/79 | HR 79 | Temp 97.8°F | Resp 20 | Wt 191.0 lb

## 2011-10-12 DIAGNOSIS — C50319 Malignant neoplasm of lower-inner quadrant of unspecified female breast: Secondary | ICD-10-CM

## 2011-10-12 NOTE — Progress Notes (Signed)
Weekly Management Note:  Site:L Chestwall/regional LNs Current Dose:  720  cGy Projected Dose: 6040  cGy  Narrative: The patient is seen today for routine under treatment assessment. CBCT/MVCT images/port films were reviewed. The chart was reviewed.   She is without complaints today but she does describe a "burning sensation" along her left chest wall which may represent neuropathic pain rather than from radiation having only had 4 treatments thus far. She has Radioplex gel to use when necessary. She'll see Dr. Welton Flakes this Friday, and I defer to her as to whether not she wants to stop her letrozole hormone therapy during radiation therapy. She may receive Taxotere-based chemotherapy following completion of left chest wall/regional node irradiation.  Physical Examination:  Filed Vitals:   10/12/11 0934  BP: 149/79  Pulse: 79  Temp: 97.8 F (36.6 C)  Resp: 20  .  Weight: 191 lb (86.637 kg). No significant skin changes. Her mastectomy wound is completely healed.  Impression: Tolerating radiation therapy well. Again, I defer to Dr. Welton Flakes as to whether not she was to stop letrozole during her radiation therapy.  Plan: Continue radiation therapy as planned. She'll see Dr. Welton Flakes this Friday.

## 2011-10-12 NOTE — Progress Notes (Signed)
Patient alert,oriented x3, 4 rad tx completed so far left axilla and chest wall, slight erythrma, dryness in fold  Mid chest, using radiaplex gel bid, only c/o burning on chest wall area left .9:37 AM

## 2011-10-13 ENCOUNTER — Ambulatory Visit
Admission: RE | Admit: 2011-10-13 | Discharge: 2011-10-13 | Disposition: A | Discharge status: Home / Self Care | Payer: Medicare Other | Source: Ambulatory Visit | Attending: Radiation Oncology | Admitting: Radiation Oncology

## 2011-10-13 ENCOUNTER — Ambulatory Visit: Payer: Medicare Other

## 2011-10-14 ENCOUNTER — Ambulatory Visit: Payer: Medicare Other

## 2011-10-14 ENCOUNTER — Ambulatory Visit
Admission: RE | Admit: 2011-10-14 | Discharge: 2011-10-14 | Disposition: A | Discharge status: Home / Self Care | Payer: Medicare Other | Source: Ambulatory Visit | Attending: Radiation Oncology | Admitting: Radiation Oncology

## 2011-10-14 ENCOUNTER — Encounter: Payer: Medicare Other | Admitting: Physical Therapy

## 2011-10-15 ENCOUNTER — Encounter: Payer: Medicare Other | Admitting: Physical Therapy

## 2011-10-15 ENCOUNTER — Ambulatory Visit: Payer: Medicare Other

## 2011-10-15 ENCOUNTER — Ambulatory Visit
Admission: RE | Admit: 2011-10-15 | Discharge: 2011-10-15 | Disposition: A | Discharge status: Home / Self Care | Payer: Medicare Other | Source: Ambulatory Visit | Attending: Radiation Oncology | Admitting: Radiation Oncology

## 2011-10-16 ENCOUNTER — Telehealth: Payer: Self-pay | Admitting: Medical Oncology

## 2011-10-16 ENCOUNTER — Ambulatory Visit
Admission: RE | Admit: 2011-10-16 | Discharge: 2011-10-16 | Disposition: A | Discharge status: Home / Self Care | Payer: Medicare Other | Source: Ambulatory Visit | Attending: Radiation Oncology | Admitting: Radiation Oncology

## 2011-10-16 ENCOUNTER — Ambulatory Visit: Payer: Medicare Other | Admitting: Oncology

## 2011-10-16 ENCOUNTER — Ambulatory Visit: Payer: Medicare Other

## 2011-10-16 ENCOUNTER — Other Ambulatory Visit (HOSPITAL_BASED_OUTPATIENT_CLINIC_OR_DEPARTMENT_OTHER): Payer: Medicare Other | Admitting: Lab

## 2011-10-16 ENCOUNTER — Telehealth: Payer: Self-pay | Admitting: *Deleted

## 2011-10-16 ENCOUNTER — Encounter: Payer: Self-pay | Admitting: Oncology

## 2011-10-16 VITALS — BP 143/80 | HR 91 | Temp 98.7°F | Resp 20 | Ht 68.0 in | Wt 190.2 lb

## 2011-10-16 DIAGNOSIS — C50319 Malignant neoplasm of lower-inner quadrant of unspecified female breast: Secondary | ICD-10-CM

## 2011-10-16 DIAGNOSIS — C50919 Malignant neoplasm of unspecified site of unspecified female breast: Secondary | ICD-10-CM

## 2011-10-16 DIAGNOSIS — J988 Other specified respiratory disorders: Secondary | ICD-10-CM

## 2011-10-16 DIAGNOSIS — Z170 Estrogen receptor positive status [ER+]: Secondary | ICD-10-CM

## 2011-10-16 DIAGNOSIS — C773 Secondary and unspecified malignant neoplasm of axilla and upper limb lymph nodes: Secondary | ICD-10-CM

## 2011-10-16 LAB — CBC WITH DIFFERENTIAL/PLATELET
BASO%: 0.5 % (ref 0.0–2.0)
EOS%: 3.3 % (ref 0.0–7.0)
HCT: 42.1 % (ref 34.8–46.6)
LYMPH%: 29.9 % (ref 14.0–49.7)
MCH: 28.7 pg (ref 25.1–34.0)
MCHC: 33 g/dL (ref 31.5–36.0)
NEUT%: 59 % (ref 38.4–76.8)
Platelets: 177 10*3/uL (ref 145–400)
RBC: 4.84 10*6/uL (ref 3.70–5.45)
lymph#: 1.1 10*3/uL (ref 0.9–3.3)
nRBC: 0 % (ref 0–0)

## 2011-10-16 LAB — COMPREHENSIVE METABOLIC PANEL (CC13)
AST: 22 U/L (ref 5–34)
Alkaline Phosphatase: 116 U/L (ref 40–150)
BUN: 10 mg/dL (ref 7.0–26.0)
Creatinine: 1 mg/dL (ref 0.6–1.1)

## 2011-10-16 NOTE — Telephone Encounter (Signed)
Per MD, message sent to Dr. Jamey Ripa about having port taken out.  Patient expressed understanding and has no further questions.

## 2011-10-16 NOTE — Telephone Encounter (Signed)
Let pateint know I have sent Dr. Jamey Ripa a note

## 2011-10-16 NOTE — Patient Instructions (Addendum)
Continue radiation therapy  Continue femara 2.5 mg daily

## 2011-10-16 NOTE — Telephone Encounter (Signed)
Patient [name] "Dr. Welton Flakes and I discussed that I didn't have to receive anymore chemo but we didn't talk about taking this port out.  If she thinks it can come out would she please send a note or something to Dr. Jamey Ripa so that I can get it out?"  Will review with MD

## 2011-10-16 NOTE — Telephone Encounter (Signed)
02-25-2012 starting at 9:00am with labs

## 2011-10-16 NOTE — Progress Notes (Signed)
OFFICE PROGRESS NOTE  CC  [name],SCOTT M, MD [facility] [address] [place] [address] [place] [address] Dr. Calton Dach Dr. Chipper Herb Dr. Romero Belling  DIAGNOSIS: 71 year old female with stage III a invasive lobular carcinoma of the left breast status post mastectomy with left axillary lymph node dissection performed on 03/23/2011.  PRIOR THERAPY:  #1 patient was originally seen by me on 03/05/2011 for a invasive lobular carcinoma of the left breast the tumor was 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 Her clinical staging was stage IIB (T3, N0, M0).  #2 the patient has gone on to have a left breast mastectomy with axillary lymph node dissection on 03/23/2011. The final pathology revealed a 6.0 cm invasive lobular carcinoma with lymphovascular invasion and perineural invasion. Invasive tumor was 2.9 cm from the nearest margin lobular carcinoma in situ was noted. Initially 6 lymph nodes were positive for metastatic carcinoma. Tumor deposits were 0.5 0.8 0.7 0.7 0.3 and 1.0 cm focal extracapsular tumor extension was noted. Tumor was grade 3. The final lymph node count was 16 9 of which were positive for metastatic disease. Tumor was estrogen receptor +100% progesterone receptor +100% HER-2/neu negative with a ratio 1.31 proliferation marker 18% final pathologic staging was p T3a pN2a, Stage IIIa   #3.patient is status post 4 cycles of FEC combination chemotherapy starting from 04/17/2011 may 2013. Her course was complicated by development of febrile neutropenia sepsis pancytopenia and mastectomy site cellulitis and chest wall abscess requiring incision and drainage.  CURRENT THERAPY: patient to begin letrozole 2.5 mg daily adjuvantly.patient also has been receiving radiation therapy and will complete this on 10/22/2011.  INTERVAL HISTORY: [name] 71 y.o. female returns for Followup visit. She is now almost done with radiation  therapy. She continues letrozole 2.5 mg daily. Overall she is tolerating both quite well. She does have redness in the breast. But she says that it is bearable. She continues to be quite happy with her final decision of not taking any more chemotherapy. And I do think that her quality of life have significantly improved. She denies any nausea vomiting fevers chills night sweats headaches she has no shortness of breath chest pains or palpitations or myalgias and arthralgias. Remainder of the 10 point review of systems is negative.  MEDICAL HISTORY: Past Medical History  Diagnosis Date  . DIABETES MELLITUS 08/10/2007  . 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 HYPERCHOLESTEROLEMIA 12/17/2006  . OBESITY 06/04/2008  . ANXIETY 06/04/2008  . DEPRESSION 08/10/2007  . HYPERTENSION 12/17/2006  . ASTHMATIC BRONCHITIS, ACUTE 06/04/2008  . ALLERGIC RHINITIS 12/13/2007  . GERD 12/17/2006  . DEGENERATIVE JOINT DISEASE 12/17/2006  . Asthma   . Blood transfusion 1964    post childbirth  . 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 History of chemotherapy     ALLERGIES:  is allergic to ceftriaxone sodium; cephalexin; clindamycin; dilaudid; oxycodone-acetaminophen; rocephin; and sulfonamide derivatives.  MEDICATIONS:  Current Outpatient Prescriptions  Medication Sig Dispense Refill  . acyclovir (ZOVIRAX) 200 MG capsule Take 2 capsules (400 mg total) by mouth every 6 (six) hours. While awake.  60 capsule  3  . ALPRAZolam (XANAX) 0.5 MG tablet TAKE ONE-HALF TO ONE TABLET BY MOUTH THREE TIMES DAILY AS NEEDED FOR  NERVES  90 tablet  1  . amLODipine (NORVASC) 10 MG tablet Take 1 tablet (10 mg total) by mouth daily.  30 tablet  11  .  atorvastatin (LIPITOR) 40 MG tablet Take 1 tablet (40 mg total) by mouth daily.  30 tablet  11  . benazepril (LOTENSIN) 40 MG tablet Take 1 tablet (40 mg total) by mouth daily.  30 tablet  11  . Cholecalciferol (VITAMIN D) 2000 UNITS tablet Take 2,000 Units by mouth  daily.        . hyaluronate sodium (RADIAPLEXRX) GEL Apply topically once.      . Insulin Glargine (LANTUS SOLOSTAR Somers) Inject 30 Units into the skin daily.       Marland Kitchen letrozole (FEMARA) 2.5 MG tablet       . lidocaine-prilocaine (EMLA) cream Apply 1 application topically as needed. For pain      . non-metallic deodorant (ALRA) MISC Apply 1 application topically daily as needed.      Marland Kitchen omeprazole (PRILOSEC) 20 MG capsule Take 1 capsule (20 mg total) by mouth daily.  30 capsule  11  . PRESCRIPTION MEDICATION Inject 208 mg into the vein See admin instructions. epirubicin (ELLENCE) chemo injection 208 mg 100 mg/m2  2.07 m2 (Treatment Plan Actual)  Once 06/18/2011   Route:  Intravenous      . PRESCRIPTION MEDICATION Inject 1,050 mg into the vein See admin instructions. Fluorouracil (ADRUCIL) chemo injection 1,050 mg 500 mg/m2  2.07 m2 (Treatment Plan Actual)  Once 06/18/2011 Route: intravenous      . PRESCRIPTION MEDICATION Inject 1,040 mg into the vein See admin instructions. cyclophosphamide (CYTOXAN) 1,040 mg in sodium chloride 0.9 % 250 mL chemo infusion  06/18/2011 Rout: Intravenous      . sertraline (ZOLOFT) 50 MG tablet Take 1 tablet (50 mg total) by mouth daily.  30 tablet  5  . DISCONTD: prochlorperazine (COMPAZINE) 10 MG tablet Take 1 tablet (10 mg total) by mouth every 6 (six) hours as needed (Nausea or vomiting).  30 tablet  1    SURGICAL HISTORY:  Past Surgical History  Procedure Date  . Laminectomy 1993    s/p-Dr. Jule Ser  . Bladder tack 1974  . Cholecystectomy 02/23/1991    [facility] - Dr Jamey Ripa  . Tonsillectomy   . Rotator cuff repair 8/03    right, Dr. Shelle Iron  . Enterocele repair 06/08    Dr. Estanislado Pandy  . Rotator cuff repair 10/26/2008    left, Dr. Rayburn Ma  . Hip surgery 08/27/2010    DR. Despina Hick  . Breast excisional biopsy 11/10/1988    left benign Dr Jamey Ripa  . Breast excisional biopsy 03/04/1983    Right - two areas - Dr Jamey Ripa  . Breast excisional biopsy 10/09/1980    right -  Dr Jamey Ripa  . Breast excisional biopsy 10/18/1979    left - Dr Jamey Ripa  . Breast excisional biopsy 01/02/1994    right - Dr Jamey Ripa  . Vaginal hysterectomy 1974    partial   . Colonoscopy   . Modified radical mastectomy w/ axillary lymph node dissection     03-30-11 LT  . Portacath placement 04/13/2011    Procedure: INSERTION PORT-A-CATH;  Surgeon: Currie Paris, MD;  Location: [facility];  Service: General;  Laterality: N/A;  porta cath placement,removal of J-P drain  . Mastectomy 03/23/2011    left  . Drain seroma 05/01/11    Chronic seroma @mastectomy  site  . Evacuation breast hematoma 07/01/2011    Procedure: EVACUATION HEMATOMA BREAST;  Surgeon: Currie Paris, MD;  Location: [facility];  Service: General;  Laterality: Left;  Drainage of left mastectomy seroma  . Breast surgery  REVIEW OF SYSTEMS:  Pertinent items are noted in HPI.   PHYSICAL EXAMINATION: General: Patient is fatigued looks dehydrated but in no acute distress. HEENT: EOMI PERRLA oral mucosa is dry neck is supple no palpable adenopathy. Lungs: Clear to auscultation cardiovascular: Regular rate rhythm abdomen: Soft nontender nondistended. Extremities: No edema clubbing or cyanosis neuro: Nonfocal, Left mastectomy incision now has daily dressing changes there is no evidence of cellulitis or active infection.  ECOG PERFORMANCE STATUS: 1 - Symptomatic but completely ambulatory  Blood pressure 143/80, pulse 91, temperature 98.7 F (37.1 C), temperature source Oral, resp. rate 20, height 5\' 8"  (1.727 m), weight 190 lb 3.2 oz (86.274 kg).  LABORATORY DATA: Lab Results  Component Value Date   WBC 3.7* 10/16/2011   HGB 13.9 10/16/2011   HCT 42.1 10/16/2011   MCV 87.0 10/16/2011   PLT 177 10/16/2011      Chemistry      Component Value Date/Time   NA 140 07/14/2011 1340   K 4.5 07/14/2011 1340   CL 101 07/14/2011 1340   CO2 27 07/14/2011 1340   BUN 12 07/14/2011 1340   CREATININE 0.59 07/14/2011 1340       Component Value Date/Time   CALCIUM 9.9 07/14/2011 1340   ALKPHOS 95 07/14/2011 1340   AST 23 07/14/2011 1340   ALT 19 07/14/2011 1340   BILITOT 0.3 07/14/2011 1340      ASSESSMENT: 71 year old female with  #1 invasive lobular carcinoma of the left breast status post mastectomy with axillary lymph node dissection. The final pathology revealed a T3 N2 way invasive lobular carcinoma. The tumor measured 6.0 cm 9 of 16 lymph nodes were positive for metastatic disease. Tumor was ER positive PR positive HER-2/neu negative proliferation marker 18%.  #2 patient is now status post 4 cycles of combination chemotherapy consisting of FEC. Her last treatment was given on May 2013. She did require recurrent hospitalizations throughout this treatment. She's had recurrent infections most recently chest wall [place]. She did become significantly pancytopenic.  #3 patient was scheduled to begin weekly Taxol as part of her adjuvant regimen but at this point I do think that the risks of treatment for her weigh the benefits since patient has not had good tolerance to any of the chemotherapeutic agents. We certainly could do this at some later point to once patient begins feel better and her chest wall region in an infection results. She and I. Therefore discussed the possibility of starting her on antiestrogen therapy since her tumor is estrogen receptor positive. We discussed the letrozole 2.5 mg daily. Side effects risks and benefits were discussed very clearly to the patient and literature was given to her.  #4patient is currently receiving radiation therapy and she will complete this on 10/22/2011.  #5 patient is very interested in having her Port-A-Cath removed and I will request that of Dr. Jamey Ripa.   PLAN:   All questions were answered. The patient knows to call the clinic with any problems, questions or concerns. We can certainly see the patient much sooner if necessary.  I spent 15 minutes counseling the  patient face to face. The total time spent in the appointment was 30 minutes.    Drue Second, MD Medical/Oncology [facility] [phone] (beeper) [phone] (Office)  10/16/2011, 12:46 PM

## 2011-10-19 ENCOUNTER — Ambulatory Visit
Admission: RE | Admit: 2011-10-19 | Discharge: 2011-10-19 | Disposition: A | Discharge status: Home / Self Care | Payer: Medicare Other | Source: Ambulatory Visit | Attending: Radiation Oncology | Admitting: Radiation Oncology

## 2011-10-19 ENCOUNTER — Telehealth (INDEPENDENT_AMBULATORY_CARE_PROVIDER_SITE_OTHER): Payer: Self-pay | Admitting: General Surgery

## 2011-10-19 ENCOUNTER — Encounter: Payer: Self-pay | Admitting: Radiation Oncology

## 2011-10-19 ENCOUNTER — Ambulatory Visit: Payer: Medicare Other

## 2011-10-19 VITALS — BP 133/80 | HR 102 | Temp 97.8°F | Resp 20 | Wt 189.2 lb

## 2011-10-19 DIAGNOSIS — C50319 Malignant neoplasm of lower-inner quadrant of unspecified female breast: Secondary | ICD-10-CM

## 2011-10-19 NOTE — Progress Notes (Signed)
Weekly Management Note:  Site:L chestwall/regional LNs Current Dose:  1620  cGy Projected Dose: 6040  cGy  Narrative: The patient is seen today for routine under treatment assessment. CBCT/MVCT images/port films were reviewed. The chart was reviewed.   No complaints today. She is using Radioplex gel. She is taking tomorrow off to go to [employer].  Physical Examination:  Filed Vitals:   10/19/11 1030  BP: 133/80  Pulse: 102  Temp: 97.8 F (36.6 C)  Resp: 20  .  Weight: 189 lb 3.2 oz (85.821 kg). There is faint erythema the skin along her left chest wall. No areas of desquamation.  Impression: Tolerating radiation therapy well.  Plan: Continue radiation therapy as planned.

## 2011-10-19 NOTE — Telephone Encounter (Signed)
Message copied by Liliana Cline on Mon Oct 19, 2011  1:32 PM ------      Message from: Currie Paris      Created: Mon Oct 19, 2011  1:24 PM       See if she wants IV sedation for this or is OK with local I think she will want anesthesia      ----- Message -----         From: Victorino December, MD         Sent: 10/16/2011   2:38 PM           To: Currie Paris, MD            Patient would like to have her port out.             It is ok with me.            Thank you             Konrad Dolores

## 2011-10-19 NOTE — Telephone Encounter (Signed)
Tried to call patient with no answer  

## 2011-10-19 NOTE — Telephone Encounter (Signed)
The patient called back.  I relayed the message about anesthesia.  She states she is a big baby and wants Versed sedation.  She also was instructed by her Radiation Oncologist to wait until after Radiation is complete.  This will be 10/29.

## 2011-10-19 NOTE — Progress Notes (Signed)
Patient here weekly rad txs, completed 9/28 on lext axilla,5/14 on l chest wall, slight erythema on skin, no c/o pain, eating okay,drinks mostly decaf coffee/tea, using radiaplex gel bid, chemotherapy has been stopped per patient stated, slight tired 10:33 AM

## 2011-10-20 ENCOUNTER — Ambulatory Visit: Payer: Medicare Other

## 2011-10-21 ENCOUNTER — Other Ambulatory Visit (INDEPENDENT_AMBULATORY_CARE_PROVIDER_SITE_OTHER): Payer: Self-pay | Admitting: Surgery

## 2011-10-21 ENCOUNTER — Ambulatory Visit
Admission: RE | Admit: 2011-10-21 | Discharge: 2011-10-21 | Disposition: A | Discharge status: Home / Self Care | Payer: Medicare Other | Source: Ambulatory Visit | Attending: Radiation Oncology | Admitting: Radiation Oncology

## 2011-10-21 ENCOUNTER — Ambulatory Visit: Payer: Medicare Other

## 2011-10-22 ENCOUNTER — Ambulatory Visit
Admission: RE | Admit: 2011-10-22 | Discharge: 2011-10-22 | Disposition: A | Discharge status: Home / Self Care | Payer: Medicare Other | Source: Ambulatory Visit | Attending: Radiation Oncology | Admitting: Radiation Oncology

## 2011-10-22 ENCOUNTER — Ambulatory Visit: Payer: Medicare Other

## 2011-10-22 ENCOUNTER — Encounter: Payer: Medicare Other | Admitting: Physical Therapy

## 2011-10-23 ENCOUNTER — Ambulatory Visit
Admission: RE | Admit: 2011-10-23 | Discharge: 2011-10-23 | Disposition: A | Discharge status: Home / Self Care | Payer: Medicare Other | Source: Ambulatory Visit | Attending: Radiation Oncology | Admitting: Radiation Oncology

## 2011-10-23 ENCOUNTER — Ambulatory Visit: Payer: Medicare Other

## 2011-10-23 ENCOUNTER — Encounter: Payer: Medicare Other | Admitting: Physical Therapy

## 2011-10-26 ENCOUNTER — Ambulatory Visit
Admission: RE | Admit: 2011-10-26 | Discharge: 2011-10-26 | Disposition: A | Discharge status: Home / Self Care | Payer: Medicare Other | Source: Ambulatory Visit | Attending: Radiation Oncology | Admitting: Radiation Oncology

## 2011-10-26 ENCOUNTER — Ambulatory Visit: Payer: Medicare Other

## 2011-10-26 DIAGNOSIS — C50319 Malignant neoplasm of lower-inner quadrant of unspecified female breast: Secondary | ICD-10-CM

## 2011-10-26 NOTE — Progress Notes (Signed)
Weekly Management Note:  Site:L Chestwall/regional LNs Current Dose:  2340  cGy Projected Dose: 6040  cGy  Narrative: The patient is seen today for routine under treatment assessment. CBCT/MVCT images/port films were reviewed. The chart was reviewed.   No complaints today except for occasional throbbing discomfort along her left axilla.. She uses Radioplex gel.  Physical Examination: There were no vitals filed for this visit..  Weight:  . There is mild erythema along the left chest wall/axilla. No areas of desquamation. The left axilla is normal to palpation.  Impression: Tolerating radiation therapy well.  Plan: Continue radiation therapy as planned.

## 2011-10-26 NOTE — Progress Notes (Signed)
Patient here weekly rad tx left axilla/chest wall, slight erythema, dry skin at incision , under arm at times throbbing pain, eating dait, drinking tea, decaf,  10:02 AM

## 2011-10-27 ENCOUNTER — Ambulatory Visit
Admission: RE | Admit: 2011-10-27 | Discharge: 2011-10-27 | Disposition: A | Discharge status: Home / Self Care | Payer: Medicare Other | Source: Ambulatory Visit | Attending: Radiation Oncology | Admitting: Radiation Oncology

## 2011-10-27 ENCOUNTER — Ambulatory Visit: Payer: Medicare Other

## 2011-10-28 ENCOUNTER — Ambulatory Visit: Payer: Medicare Other

## 2011-10-28 ENCOUNTER — Ambulatory Visit
Admission: RE | Admit: 2011-10-28 | Discharge: 2011-10-28 | Disposition: A | Discharge status: Home / Self Care | Payer: Medicare Other | Source: Ambulatory Visit | Attending: Radiation Oncology | Admitting: Radiation Oncology

## 2011-10-29 ENCOUNTER — Ambulatory Visit
Admission: RE | Admit: 2011-10-29 | Discharge: 2011-10-29 | Disposition: A | Discharge status: Home / Self Care | Payer: Medicare Other | Source: Ambulatory Visit | Attending: Radiation Oncology | Admitting: Radiation Oncology

## 2011-10-29 ENCOUNTER — Ambulatory Visit: Payer: Medicare Other

## 2011-10-30 ENCOUNTER — Ambulatory Visit
Admission: RE | Admit: 2011-10-30 | Discharge: 2011-10-30 | Disposition: A | Discharge status: Home / Self Care | Payer: Medicare Other | Source: Ambulatory Visit | Attending: Radiation Oncology | Admitting: Radiation Oncology

## 2011-10-30 ENCOUNTER — Encounter: Payer: Medicare Other | Admitting: Physical Therapy

## 2011-10-30 ENCOUNTER — Ambulatory Visit: Payer: Medicare Other

## 2011-11-02 ENCOUNTER — Ambulatory Visit
Admission: RE | Admit: 2011-11-02 | Discharge: 2011-11-02 | Disposition: A | Discharge status: Home / Self Care | Payer: Medicare Other | Source: Ambulatory Visit | Attending: Radiation Oncology | Admitting: Radiation Oncology

## 2011-11-02 ENCOUNTER — Encounter: Payer: Self-pay | Admitting: Radiation Oncology

## 2011-11-02 VITALS — BP 142/71 | HR 83 | Temp 97.7°F | Resp 20 | Wt 189.5 lb

## 2011-11-02 DIAGNOSIS — C50319 Malignant neoplasm of lower-inner quadrant of unspecified female breast: Secondary | ICD-10-CM

## 2011-11-02 MED ORDER — BIAFINE EX EMUL
CUTANEOUS | Status: DC | PRN
  Administered 2011-11-02: 10:00:00 via TOPICAL

## 2011-11-02 NOTE — Progress Notes (Signed)
Patient here weekly rad txs, skin dermatitis an derythema, c/o burning, itching, gave biafine cream and hydogel pads, ,skin intact though, instructions on use of both 9:36 AM

## 2011-11-02 NOTE — Progress Notes (Signed)
Weekly Management Note:  Site:L Chestwall/Regional LNs Current Dose:  3240  cGy Projected Dose: 6040  cGy  Narrative: The patient is seen today for routine under treatment assessment. CBCT/MVCT images/port films were reviewed. The chart was reviewed.    She is having more discomfort along her left chest wall and axilla as expected. She was changed from Radioplex gel to Biafine cream and also give her hydrogel pads.  Physical Examination:  Filed Vitals:   11/02/11 0932  BP: 142/71  Pulse: 83  Temp: 97.7 F (36.5 C)  Resp: 20  .  Weight: 189 lb 8 oz (85.957 kg).  there is diffuse erythema along left chest wall and regional lymph nodes. There is patchy dry desquamation with focal linear moist desquamation along her left mastectomy scar.   Impression: Tolerating radiation therapy well. moderate radiation dermatitis as expected. I told she may use Aleve when necessary chest wall discomfort.   Plan: Continue radiation therapy as planned.

## 2011-11-03 ENCOUNTER — Ambulatory Visit
Admission: RE | Admit: 2011-11-03 | Discharge: 2011-11-03 | Disposition: A | Discharge status: Home / Self Care | Payer: Medicare Other | Source: Ambulatory Visit | Attending: Radiation Oncology | Admitting: Radiation Oncology

## 2011-11-04 ENCOUNTER — Ambulatory Visit
Admission: RE | Admit: 2011-11-04 | Discharge: 2011-11-04 | Disposition: A | Discharge status: Home / Self Care | Payer: Medicare Other | Source: Ambulatory Visit | Attending: Radiation Oncology | Admitting: Radiation Oncology

## 2011-11-05 ENCOUNTER — Ambulatory Visit
Admission: RE | Admit: 2011-11-05 | Discharge: 2011-11-05 | Disposition: A | Discharge status: Home / Self Care | Payer: Medicare Other | Source: Ambulatory Visit | Attending: Radiation Oncology | Admitting: Radiation Oncology

## 2011-11-06 ENCOUNTER — Ambulatory Visit
Admission: RE | Admit: 2011-11-06 | Discharge: 2011-11-06 | Disposition: A | Discharge status: Home / Self Care | Payer: Medicare Other | Source: Ambulatory Visit | Attending: Radiation Oncology | Admitting: Radiation Oncology

## 2011-11-09 ENCOUNTER — Encounter: Payer: Self-pay | Admitting: Radiation Oncology

## 2011-11-09 ENCOUNTER — Ambulatory Visit
Admission: RE | Admit: 2011-11-09 | Discharge: 2011-11-09 | Disposition: A | Discharge status: Home / Self Care | Payer: Medicare Other | Source: Ambulatory Visit | Attending: Radiation Oncology | Admitting: Radiation Oncology

## 2011-11-09 VITALS — BP 128/79 | HR 81 | Temp 98.2°F | Resp 20 | Wt 189.9 lb

## 2011-11-09 DIAGNOSIS — C50319 Malignant neoplasm of lower-inner quadrant of unspecified female breast: Secondary | ICD-10-CM

## 2011-11-09 NOTE — Progress Notes (Signed)
Complex simulation note: The patient was taken to the [facility] treatment unit for her electron beam simulation. I marked her electron beam field on her left chest wall. She was set up to LAO. One custom block was constructed to conform the field. I prescribing 1000 cGy in 5 sessions utilizing 6 MEV electrons. A special port plan is requested. 0.5 cm custom bolus will be constructed on the first day of her treatment.

## 2011-11-09 NOTE — Progress Notes (Signed)
Patient here weekly rad txs, left chest wall/axilla area , erhthema, moist desquamation in fold of left chest wall, dermatitis on top pf subclavian and top of chest, painful, using biafine cream for itching Eating and drinking well 10:08 AM

## 2011-11-09 NOTE — Progress Notes (Signed)
Weekly Management Note:  Site:L Chestwall/Regional LNs Current Dose:  4140  cGy Projected Dose: 6040  cGy  Narrative: The patient is seen today for routine under treatment assessment. CBCT/MVCT images/port films were reviewed. The chart was reviewed.   She is without new complaints today. She uses Biafine cream . She underwent electron beam  simulation for her left chest wall boost earlier today.  Physical Examination:  Filed Vitals:   11/09/11 1011  BP: 128/79  Pulse: 81  Temp: 98.2 F (36.8 C)  Resp: 20  .  Weight: 189 lb 14.4 oz (86.138 kg). There is marked erythema and hyperpigmentation the skin along left chest wall and regional lymph node fields. There is focal linear patchy moist desquamation along her left mastectomy scar.  Impression: Tolerating radiation therapy well. Radiation dermatitis as expected. I told her to start triple antibiotic ointment along the areas of moist desquamation.  Plan: Continue radiation therapy as planned.

## 2011-11-10 ENCOUNTER — Ambulatory Visit
Admission: RE | Admit: 2011-11-10 | Discharge: 2011-11-10 | Disposition: A | Discharge status: Home / Self Care | Payer: Medicare Other | Source: Ambulatory Visit | Attending: Radiation Oncology | Admitting: Radiation Oncology

## 2011-11-11 ENCOUNTER — Ambulatory Visit
Admission: RE | Admit: 2011-11-11 | Discharge: 2011-11-11 | Disposition: A | Discharge status: Home / Self Care | Payer: Medicare Other | Source: Ambulatory Visit | Attending: Radiation Oncology | Admitting: Radiation Oncology

## 2011-11-12 ENCOUNTER — Ambulatory Visit
Admission: RE | Admit: 2011-11-12 | Discharge: 2011-11-12 | Disposition: A | Discharge status: Home / Self Care | Payer: Medicare Other | Source: Ambulatory Visit | Attending: Radiation Oncology | Admitting: Radiation Oncology

## 2011-11-13 ENCOUNTER — Ambulatory Visit
Admission: RE | Admit: 2011-11-13 | Discharge: 2011-11-13 | Disposition: A | Discharge status: Home / Self Care | Payer: Medicare Other | Source: Ambulatory Visit | Attending: Radiation Oncology | Admitting: Radiation Oncology

## 2011-11-16 ENCOUNTER — Ambulatory Visit
Admission: RE | Admit: 2011-11-16 | Discharge: 2011-11-16 | Disposition: A | Discharge status: Home / Self Care | Payer: Medicare Other | Source: Ambulatory Visit | Attending: Radiation Oncology | Admitting: Radiation Oncology

## 2011-11-16 ENCOUNTER — Encounter: Payer: Self-pay | Admitting: Radiation Oncology

## 2011-11-16 VITALS — BP 145/74 | HR 77 | Temp 97.4°F | Resp 20 | Wt 191.5 lb

## 2011-11-16 DIAGNOSIS — C50319 Malignant neoplasm of lower-inner quadrant of unspecified female breast: Secondary | ICD-10-CM

## 2011-11-16 NOTE — Progress Notes (Signed)
Patient here weekly rad txs left breast area, subcalvian  raw looking, under left chestwall skin with moist desquamation  Uses neosporin with pain relief and hydrogel pads, but says little relief,

## 2011-11-16 NOTE — Progress Notes (Signed)
Weekly Management Note:  Site: Left chest wall/regional lymph nodes Current Dose:  5040  cGy Projected Dose: 6040   CGy (chest wall/mastectomy scar)  Narrative: The patient is seen today for routine under treatment assessment. CBCT/MVCT images/port films were reviewed. The chart was reviewed.   She does report left breast discomfort as expected. She has been applying Neosporin antibiotic ointment.  Physical Examination:  Filed Vitals:   11/16/11 0917  BP: 145/74  Pulse: 77  Temp: 97.4 F (36.3 C)  Resp: 20  .  Weight: 191 lb 8 oz (86.864 kg). There is a linear area of moist desquamation along her left mastectomy scar. Dry desquamation elsewhere.  Impression: Tolerating radiation therapy well. She'll continue with Neosporin ointment. Plan: Continue radiation therapy as planned.

## 2011-11-17 ENCOUNTER — Telehealth: Payer: Self-pay | Admitting: Pulmonary Disease

## 2011-11-17 ENCOUNTER — Ambulatory Visit: Payer: Medicare Other

## 2011-11-17 ENCOUNTER — Telehealth: Payer: Self-pay | Admitting: *Deleted

## 2011-11-17 MED ORDER — FIRST-DUKES MOUTHWASH MT SUSP: OROMUCOSAL | Status: DC

## 2011-11-17 MED ORDER — AMOXICILLIN-POT CLAVULANATE 875-125 MG PO TABS: 1.0000 | ORAL_TABLET | Freq: Two times a day (BID) | ORAL | Status: DC

## 2011-11-17 NOTE — Telephone Encounter (Signed)
Called spoke with patient > she is not aware of any penicillin allergy.  Thanks.

## 2011-11-17 NOTE — Telephone Encounter (Signed)
I spoke with pt and she c/obilateral ear pain, facial pressure, nasal congestion, blowing out yellow phlem, coughing up yellow phlem, sore throat, having hard time swallowing. She will finish radiation in 5 days. She would like something call in for this and something for her throat. Please advise SN thanks   [facility] [address] Allergies  Allergen Reactions  . Ceftriaxone Sodium     REACTION: hives  . Cephalexin     REACTION: hives  . Clindamycin     REACTION: hives  . Dilaudid (Hydromorphone Hcl) Itching  . Oxycodone-Acetaminophen     REACTION: hives  . Rocephin (Ceftriaxone Sodium In Dextrose) Hives  . Sulfonamide Derivatives     REACTION: hives

## 2011-11-17 NOTE — Telephone Encounter (Signed)
Per SN---ask pt if she can take penicillin.  thanks

## 2011-11-17 NOTE — Telephone Encounter (Signed)
Per SN---ok to send in augmentin 875 mg  #14  1 po bid  And this has been called to the pharmacy and i attmpted to call pt but her VM has not been set up yet.  Will try to call pt back to make her aware.

## 2011-11-17 NOTE — Telephone Encounter (Signed)
Called spoke with patient, advised of SN's recs as stated below.  Pt verbalized her understanding and stated that she has already spoken with radiation oncologist regarding her sore throat.  Rx's sent to verified pharmacy. =========== When attempting to send pt's augmentin, received a allergy/contraindication alert regarding the augmentin and ceftriaxone and cephalexin.  Dr Kriste Basque please advise, thanks!  *mmw already sent.

## 2011-11-17 NOTE — Telephone Encounter (Signed)
Per SN---call in augmentin 875 mg  #14  1 po bid , mmw #4oz  1 tsp gargle and swallow qid but she will need to let her radiation oncologist know about the sore throat.  Thanks

## 2011-11-17 NOTE — Telephone Encounter (Signed)
Pt called back and says that only mouth wash was called in but she "needs" penicillin and wants this called in "now" as she has been "waiting since this morning for this". Call pt back at [phone]. Hazel Sams

## 2011-11-17 NOTE — Telephone Encounter (Signed)
Duplicate message. Kathleen W Perdue °

## 2011-11-17 NOTE — Telephone Encounter (Signed)
Pt called me back and she is aware that this rx has been called into the pharmacy for her.

## 2011-11-17 NOTE — Telephone Encounter (Signed)
Copied from previous message:     Randell Loop, [facility] 11/17/2011 2:43 PM Signed  Duplicate message. See other message in epic. Hazel Sams 11/17/2011 2:41 PM Signed  Pt called back and says that only mouth wash was called in but she "needs" penicillin and wants this called in "now" as she has been "waiting since this morning for this". Call pt back at [phone]. Hazel Sams  Antionette Fairy 11/17/2011 12:38 PM Signed  Pt called & stated that she will need more than a mouth wash & can be reached [phone]. Antionette Fairy   [place], [place] 11/17/2011 10:03 AM Signed  Called spoke with patient > she is not aware of any penicillin allergy. Thanks. Randell Loop, [facility] 11/17/2011 9:52 AM Signed  Per SN---ask pt if she can take penicillin. thanks Boone Master, MA 11/17/2011 9:46 AM Signed  Called spoke with patient, advised of SN's recs as stated below. Pt verbalized her understanding and stated that she has already spoken with radiation oncologist regarding her sore throat. Rx's sent to verified pharmacy.  ===========  When attempting to send pt's augmentin, received a allergy/contraindication alert regarding the augmentin and ceftriaxone and cephalexin. Dr Kriste Basque please advise, thanks!  *mmw already sent. Randell Loop, [facility] 11/17/2011 9:15 AM Signed  Per SN---call in augmentin 875 mg #14 1 po bid , mmw #4oz 1 tsp gargle and swallow qid but she will need to let her radiation oncologist know about the sore throat. Thanks   Carver Fila, CMA 11/17/2011 8:40 AM Signed  I spoke with pt and she c/obilateral ear pain, facial pressure, nasal congestion, blowing out yellow phlem, coughing up yellow phlem, sore throat, having hard time swallowing. She will finish radiation in 5 days. She would like something call in for this and something for her throat. Please advise SN thanks  [facility] [address]     Routing History        From  To  Priority    11/17/2011 2:41 PM  Maye Hides Perdue  Marcellus Scott,  CMA  High    11/17/2011 12:38 PM  Antionette Fairy  Marcellus Scott, CMA  High    11/17/2011 10:03 AM  Shanda Bumps Bonne Dolores, MA  Marcellus Scott, CMA  High    11/17/2011 9:52 AM  Marcellus Scott, CMA  P LBPU TRIAGE POOL  High    11/17/2011 9:46 AM  Sherre Lain, MA  Marcellus Scott, CMA  High    11/17/2011 9:15 AM  Marcellus Scott, CMA  P LBPU TRIAGE POOL  High    11/17/2011 8:40 AM  Mindy Rica Records, CMA  Marcellus Scott, CMA  High    11/17/2011 8:23 AM  Leanora Ivanoff  P LBPU TRIAGE POOL  Routine       Created by     Leanora Ivanoff on 11/17/2011 8:22 AM         Approved        Disp  Refills  Start  End    Diphenhyd-Hydrocort-Nystatin (FIRST-DUKES MOUTHWASH) SUSP  240 mL  0  11/17/2011      Sig: Gargle and swallow 1 tsp four times daily    Class: Normal    Authorizing Provider: Michele Mcalpine, MD    Ordering User: Sherre Lain, MA                         Visit Pharmacy  [id number] [place], [place] - [address]         Contacts         Type  Contact  Phone    11/17/2011 8:22 AM  Phone (Incoming)  Leonarda, Leis (Self)  [phone] (H)    [facility] [address] Pt called and stated that she has 5 days left of radiation and her throat is very sore. She would like something for this.

## 2011-11-17 NOTE — Telephone Encounter (Signed)
Pt called & stated that she will need more than a mouth wash & can be reached [phone].  Antionette Fairy

## 2011-11-17 NOTE — Telephone Encounter (Signed)
Patient called requesting an antibiotic called in, she has horrible sore throat, head hurts, nose full of snot,ears hurt,"she called her regular MD , Dr. Kriste Basque, nothing but mouth wash called in, informed patient who cancelled rad tx today, to try and call her primary Md back and ask to speak with his PA or nurse, no fever, will call her back by 4pm today , suggested  To drink plenty water, chicken soup, if posible, cough drops, patient weill call her primary MD back 2:35 PM

## 2011-11-17 NOTE — Telephone Encounter (Signed)
Duplicate message.  See other message in epic.

## 2011-11-18 ENCOUNTER — Telehealth: Payer: Self-pay | Admitting: *Deleted

## 2011-11-18 ENCOUNTER — Ambulatory Visit
Admission: RE | Admit: 2011-11-18 | Discharge: 2011-11-18 | Disposition: A | Discharge status: Home / Self Care | Payer: Medicare Other | Source: Ambulatory Visit | Attending: Radiation Oncology | Admitting: Radiation Oncology

## 2011-11-18 NOTE — Telephone Encounter (Signed)
Called and spoke with patient she did get her antibiotic and took 1 at 4pm and 1030pm yesterday ,feels much better,no fever,no sore throat, slept most of yesterday, she did come in for radiation treatment this morning, thanked this Rn for calling and checking on her 11:21 AM

## 2011-11-19 ENCOUNTER — Ambulatory Visit
Admission: RE | Admit: 2011-11-19 | Discharge: 2011-11-19 | Disposition: A | Discharge status: Home / Self Care | Payer: Medicare Other | Source: Ambulatory Visit | Attending: Radiation Oncology | Admitting: Radiation Oncology

## 2011-11-20 ENCOUNTER — Ambulatory Visit
Admission: RE | Admit: 2011-11-20 | Discharge: 2011-11-20 | Disposition: A | Discharge status: Home / Self Care | Payer: Medicare Other | Source: Ambulatory Visit | Attending: Radiation Oncology | Admitting: Radiation Oncology

## 2011-11-23 ENCOUNTER — Ambulatory Visit
Admission: RE | Admit: 2011-11-23 | Discharge: 2011-11-23 | Disposition: A | Discharge status: Home / Self Care | Payer: Medicare Other | Source: Ambulatory Visit | Attending: Radiation Oncology | Admitting: Radiation Oncology

## 2011-11-23 ENCOUNTER — Telehealth: Payer: Self-pay | Admitting: *Deleted

## 2011-11-23 ENCOUNTER — Other Ambulatory Visit: Payer: Self-pay | Admitting: Radiation Oncology

## 2011-11-23 ENCOUNTER — Encounter: Payer: Self-pay | Admitting: Radiation Oncology

## 2011-11-23 VITALS — BP 140/77 | HR 92 | Temp 98.0°F | Resp 20 | Wt 191.2 lb

## 2011-11-23 DIAGNOSIS — I890 Lymphedema, not elsewhere classified: Secondary | ICD-10-CM

## 2011-11-23 DIAGNOSIS — C50319 Malignant neoplasm of lower-inner quadrant of unspecified female breast: Secondary | ICD-10-CM

## 2011-11-23 NOTE — Progress Notes (Signed)
Weekly Management Note:  Site: Right chest wall/mastectomy scar boost Current Dose:  5840  cGy Projected Dose: 6040  cGy  Narrative: The patient is seen today for routine under treatment assessment. CBCT/MVCT images/port films were reviewed. The chart was reviewed.   She is without new complaints today. She has been using antibiotic ointment along her mastectomy scar. She still has discomfort along her lower axilla.  Physical Examination:  Filed Vitals:   11/23/11 0924  BP: 140/77  Pulse: 92  Temp: 98 F (36.7 C)  Resp: 20  .  Weight: 191 lb 3.2 oz (86.728 kg). He remains febrile linear moist desquamation along her left mastectomy scar. No other areas of desquamation. There is 1+ left upper extremity lymphedema.  Impression: Tolerating radiation therapy well. She'll finish her radiation therapy tomorrow. I instructed her to contact physical therapy to continue with her lymphedema management. She is also to start wearing her left upper extremity elastic sleeve.  Plan: Continue radiation therapy as planned. To finish tomorrow. One-month followup visit

## 2011-11-23 NOTE — Progress Notes (Signed)
patient here weekly rad tx left chest wall/axilla dry desquamation, in areas, no drainage, healing , uses neosporin  On peeled areas, fatigued, painful under axilla, burning a lot last night,  Patient tearful   9:27 AM

## 2011-11-23 NOTE — Telephone Encounter (Signed)
xxxx 

## 2011-11-24 ENCOUNTER — Ambulatory Visit
Admission: RE | Admit: 2011-11-24 | Discharge: 2011-11-24 | Disposition: A | Discharge status: Home / Self Care | Payer: Medicare Other | Source: Ambulatory Visit | Attending: Radiation Oncology | Admitting: Radiation Oncology

## 2011-11-24 ENCOUNTER — Ambulatory Visit: Payer: Medicare Other | Admitting: Physical Therapy

## 2011-11-25 ENCOUNTER — Encounter (HOSPITAL_BASED_OUTPATIENT_CLINIC_OR_DEPARTMENT_OTHER): Payer: Self-pay | Admitting: *Deleted

## 2011-11-25 ENCOUNTER — Encounter: Payer: Self-pay | Admitting: Radiation Oncology

## 2011-11-25 ENCOUNTER — Ambulatory Visit: Payer: Medicare Other | Attending: Radiation Oncology | Admitting: Physical Therapy

## 2011-11-25 DIAGNOSIS — IMO0001 Reserved for inherently not codable concepts without codable children: Secondary | ICD-10-CM | POA: Insufficient documentation

## 2011-11-25 DIAGNOSIS — M25619 Stiffness of unspecified shoulder, not elsewhere classified: Secondary | ICD-10-CM | POA: Insufficient documentation

## 2011-11-25 DIAGNOSIS — M24519 Contracture, unspecified shoulder: Secondary | ICD-10-CM | POA: Insufficient documentation

## 2011-11-25 NOTE — Progress Notes (Signed)
[facility] Radiation Oncology End of Treatment Note  Name:Kayliee LAMYIA CDEBACA  Date: 11/25/2011 ZOX:[id number] DOB:02-02-1941   Status:outpatient    CC: Michele Mcalpine, MD , Dr. Drue Second, Dr. Cicero Duck   REFERRING PHYSICIAN:  Dr. Drue Second   DIAGNOSIS: Clinical stage IIB (T3, N0, M0), pathologic stage IIIA (T3, N2, M0) invasive lobular carcinoma of the left breast   INDICATION FOR TREATMENT: Curative   TREATMENT DATES: 10/07/2011 through 11/24/2011                          SITE/DOSE:  Left chest wall and regional lymph nodes 5040 cGy 28 sessions left mastectomy scar/chest wall boost 1000 cGy 5 sessions                          BEAMS/ENERGY: Tangential fields to the left chest wall, mixed 6 MV/18 MV photons with 1.0 cm bolus applied every other day to maximize the dose to the skin surface. 6 MV photons, RAO, left axilla/supraclavicular region. 10 MV photons PA left axillary boost to bring the mid plane dose to 4600 cGy in 20 sessions. 6 MEV electrons, left chest wall/mastectomy scar boost                  NARRATIVE: The patient tolerated treatment well with the expected degree of radiation dermatitis by completion of therapy. She develop a linear moist desquamation along her left mastectomy scar which was treated with antibiotic ointment.                           PLAN: Routine followup in one month. Patient instructed to call if questions or worsening complaints in interim.

## 2011-11-26 ENCOUNTER — Encounter (INDEPENDENT_AMBULATORY_CARE_PROVIDER_SITE_OTHER): Payer: Self-pay | Admitting: Surgery

## 2011-11-30 ENCOUNTER — Ambulatory Visit: Payer: Medicare Other | Admitting: Physical Therapy

## 2011-12-01 ENCOUNTER — Encounter: Payer: Self-pay | Admitting: Endocrinology

## 2011-12-01 ENCOUNTER — Encounter (HOSPITAL_BASED_OUTPATIENT_CLINIC_OR_DEPARTMENT_OTHER)
Admission: RE | Admit: 2011-12-01 | Discharge: 2011-12-01 | Disposition: A | Discharge status: Home / Self Care | Payer: Medicare Other | Source: Ambulatory Visit | Attending: Surgery | Admitting: Surgery

## 2011-12-01 ENCOUNTER — Ambulatory Visit (INDEPENDENT_AMBULATORY_CARE_PROVIDER_SITE_OTHER): Payer: Medicare Other | Admitting: Endocrinology

## 2011-12-01 VITALS — BP 126/80 | HR 80 | Temp 98.0°F | Wt 187.0 lb

## 2011-12-01 DIAGNOSIS — E119 Type 2 diabetes mellitus without complications: Secondary | ICD-10-CM

## 2011-12-01 LAB — BASIC METABOLIC PANEL
BUN: 12 mg/dL (ref 6–23)
GFR calc non Af Amer: 87 mL/min — ABNORMAL LOW (ref >90)
Glucose, Bld: 284 mg/dL — ABNORMAL HIGH (ref 70–99)
Potassium: 4.5 mEq/L (ref 3.5–5.1)

## 2011-12-01 NOTE — Patient Instructions (Addendum)
check your blood sugar 2 times a day.  vary the time of day when you check, between before the 3 meals, after meals, and at bedtime.  also check if you have symptoms of your blood sugar being too high or too low.  please keep a record of the readings and bring it to your next appointment here.  please call us sooner if your blood sugar goes below 70, or if it stays over 200.   reduce lantus to 15 units per day.  Start humalog 5 units 3 times a day (just before each meal).   Please come back for a follow-up appointment in 6 weeks.    (update: for some reason, epic will not allow access to progress note.  Impression is: DM, Based on the pattern of her cbg's, she needs some adjustment in her therapy)

## 2011-12-01 NOTE — Progress Notes (Signed)
  Subjective:    Patient ID: [name], female    DOB: 1940/03/21, 71 y.o.   MRN: [id number]  HPI Pt returns for f/u of type 2 DM (dx'ed 2007; no known complications).  Last week, she finished XRT, and chemotx.  She has increased the lantus to 30 units qd.   no cbg record, but states cbg's vary from 100-150.  It is in general higher as the day goes on.     Review of Systems denies hypoglycemia.    Objective:   Physical Exam VITAL SIGNS:  See vs page GENERAL: no distress. Pulses: dorsalis pedis intact bilat.   Feet: no deformity.  no ulcer on the feet.  feet are of normal color and temp.  no edema Neuro: sensation is intact to touch on the feet         Assessment & Plan:

## 2011-12-02 ENCOUNTER — Encounter (HOSPITAL_BASED_OUTPATIENT_CLINIC_OR_DEPARTMENT_OTHER): Payer: Self-pay | Admitting: Anesthesiology

## 2011-12-02 ENCOUNTER — Encounter (HOSPITAL_BASED_OUTPATIENT_CLINIC_OR_DEPARTMENT_OTHER)
Admission: RE | Disposition: A | Discharge status: Home / Self Care | Payer: Self-pay | Source: Ambulatory Visit | Attending: Surgery

## 2011-12-02 ENCOUNTER — Ambulatory Visit (HOSPITAL_BASED_OUTPATIENT_CLINIC_OR_DEPARTMENT_OTHER): Payer: Medicare Other | Admitting: Anesthesiology

## 2011-12-02 ENCOUNTER — Encounter (HOSPITAL_BASED_OUTPATIENT_CLINIC_OR_DEPARTMENT_OTHER): Payer: Self-pay | Admitting: *Deleted

## 2011-12-02 ENCOUNTER — Ambulatory Visit (HOSPITAL_BASED_OUTPATIENT_CLINIC_OR_DEPARTMENT_OTHER)
Admission: RE | Admit: 2011-12-02 | Discharge: 2011-12-02 | Disposition: A | Discharge status: Home / Self Care | Payer: Medicare Other | Source: Ambulatory Visit | Attending: Surgery | Admitting: Surgery

## 2011-12-02 DIAGNOSIS — K219 Gastro-esophageal reflux disease without esophagitis: Secondary | ICD-10-CM | POA: Insufficient documentation

## 2011-12-02 DIAGNOSIS — Z452 Encounter for adjustment and management of vascular access device: Secondary | ICD-10-CM | POA: Insufficient documentation

## 2011-12-02 DIAGNOSIS — E7800 Pure hypercholesterolemia, unspecified: Secondary | ICD-10-CM | POA: Insufficient documentation

## 2011-12-02 DIAGNOSIS — Z01812 Encounter for preprocedural laboratory examination: Secondary | ICD-10-CM | POA: Insufficient documentation

## 2011-12-02 DIAGNOSIS — I10 Essential (primary) hypertension: Secondary | ICD-10-CM | POA: Insufficient documentation

## 2011-12-02 DIAGNOSIS — E119 Type 2 diabetes mellitus without complications: Secondary | ICD-10-CM | POA: Insufficient documentation

## 2011-12-02 DIAGNOSIS — C50919 Malignant neoplasm of unspecified site of unspecified female breast: Secondary | ICD-10-CM | POA: Insufficient documentation

## 2011-12-02 DIAGNOSIS — C50319 Malignant neoplasm of lower-inner quadrant of unspecified female breast: Secondary | ICD-10-CM

## 2011-12-02 HISTORY — PX: PORT-A-CATH REMOVAL: SHX5289

## 2011-12-02 SURGERY — REMOVAL PORT-A-CATH
Anesthesia: Monitor Anesthesia Care | Site: Chest | Wound class: Clean

## 2011-12-02 MED ORDER — LACTATED RINGERS IV SOLN
INTRAVENOUS | Status: DC | PRN
  Administered 2011-12-02: 08:00:00 via INTRAVENOUS

## 2011-12-02 MED ORDER — LACTATED RINGERS IV SOLN
INTRAVENOUS | Status: DC
  Administered 2011-12-02: 09:00:00 via INTRAVENOUS

## 2011-12-02 MED ORDER — CHLORHEXIDINE GLUCONATE 4 % EX LIQD: 1.0000 "application " | Freq: Once | CUTANEOUS | Status: DC

## 2011-12-02 MED ORDER — PROPOFOL 10 MG/ML IV EMUL
INTRAVENOUS | Status: DC | PRN
  Administered 2011-12-02: 75 ug/kg/min via INTRAVENOUS

## 2011-12-02 MED ORDER — FENTANYL CITRATE 0.05 MG/ML IJ SOLN
INTRAMUSCULAR | Status: DC | PRN
  Administered 2011-12-02: 50 ug via INTRAVENOUS

## 2011-12-02 MED ORDER — MIDAZOLAM HCL 5 MG/5ML IJ SOLN
INTRAMUSCULAR | Status: DC | PRN
  Administered 2011-12-02: 1 mg via INTRAVENOUS

## 2011-12-02 MED ORDER — BUPIVACAINE HCL 0.25 % IJ SOLN
INTRAMUSCULAR | Status: DC | PRN
  Administered 2011-12-02: 15 mL

## 2011-12-02 MED ORDER — HYDROCODONE-ACETAMINOPHEN 5-325 MG PO TABS: 1.0000 | ORAL_TABLET | ORAL | Status: DC | PRN

## 2011-12-02 MED ORDER — HYDROCODONE-ACETAMINOPHEN 5-325 MG PO TABS
1.0000 | ORAL_TABLET | Freq: Once | ORAL | Status: AC
  Administered 2011-12-02: 1 via ORAL

## 2011-12-02 MED ORDER — FENTANYL CITRATE 0.05 MG/ML IJ SOLN
25.0000 ug | INTRAMUSCULAR | Status: DC | PRN
  Administered 2011-12-02 (×2): 25 ug via INTRAVENOUS

## 2011-12-02 SURGICAL SUPPLY — 29 items
ADH SKN CLS APL DERMABOND .7 (GAUZE/BANDAGES/DRESSINGS) ×1
BLADE SURG 15 STRL LF DISP TIS (BLADE) ×1 IMPLANT
BLADE SURG 15 STRL SS (BLADE) ×2
CHLORAPREP W/TINT 26ML (MISCELLANEOUS) ×2 IMPLANT
CLOTH BEACON ORANGE TIMEOUT ST (SAFETY) ×2 IMPLANT
COVER MAYO STAND STRL (DRAPES) ×2 IMPLANT
COVER TABLE BACK 60X90 (DRAPES) ×2 IMPLANT
DECANTER SPIKE VIAL GLASS SM (MISCELLANEOUS) ×2 IMPLANT
DERMABOND ADVANCED (GAUZE/BANDAGES/DRESSINGS) ×1
DERMABOND ADVANCED .7 DNX12 (GAUZE/BANDAGES/DRESSINGS) ×1 IMPLANT
DRAPE PED LAPAROTOMY (DRAPES) ×2 IMPLANT
DRAPE UTILITY XL STRL (DRAPES) ×2 IMPLANT
ELECT REM PT RETURN 9FT ADLT (ELECTROSURGICAL) ×2
ELECTRODE REM PT RTRN 9FT ADLT (ELECTROSURGICAL) ×1 IMPLANT
GAUZE SPONGE 4X4 12PLY STRL LF (GAUZE/BANDAGES/DRESSINGS) ×2 IMPLANT
GLOVE ECLIPSE 6.5 STRL STRAW (GLOVE) ×1 IMPLANT
GLOVE EUDERMIC 7 POWDERFREE (GLOVE) ×2 IMPLANT
GOWN PREVENTION PLUS XLARGE (GOWN DISPOSABLE) ×4 IMPLANT
NDL HYPO 25X1 1.5 SAFETY (NEEDLE) ×1 IMPLANT
NEEDLE HYPO 25X1 1.5 SAFETY (NEEDLE) ×2 IMPLANT
PACK BASIN DAY SURGERY FS (CUSTOM PROCEDURE TRAY) ×2 IMPLANT
PENCIL BUTTON HOLSTER BLD 10FT (ELECTRODE) ×2 IMPLANT
SLEEVE SCD COMPRESS KNEE MED (MISCELLANEOUS) IMPLANT
SUT MNCRL AB 4-0 PS2 18 (SUTURE) ×2 IMPLANT
SUT VICRYL 3-0 CR8 SH (SUTURE) ×2 IMPLANT
SYR CONTROL 10ML LL (SYRINGE) ×2 IMPLANT
TOWEL OR 17X24 6PK STRL BLUE (TOWEL DISPOSABLE) ×3 IMPLANT
TOWEL OR NON WOVEN STRL DISP B (DISPOSABLE) ×2 IMPLANT
WATER STERILE IRR 1000ML POUR (IV SOLUTION) ×2 IMPLANT

## 2011-12-02 NOTE — Op Note (Signed)
AMIIRA LIER May 23, 1940 [id number] 10/21/2011  Preoperative diagnosis: Un-Needed PAC  Postoperative diagnosis: Same  Procedure: Portacath Removal  Surgeon: Currie Paris, MD, FACS  Anesthesia:MAC combined with regional for post-op pain   Clinical History and Indications: The patient has finished her chemotherapy and no longer needs a port. She wishes to have it removed.  Procedure: The patient was seen in the preoperative area and we confirmed the plans for the procedure as noted above. The Port-A-Cath site was identified and marked. The patient had no further questions.  The patient was then taken into the procedure room. Anesthesia was obtained.The timeout was done. There is prepped and draped. A 25% plain Marcaine was used for local anesthesia  The old scar was opened. The capsule around the port opened and the port identified. The holding sutures were cut. The catheter was backed partially out of its tract. A figure 8 3-0 Vicryl suture was placed, the tubing removed, and the suture tied down to prevent backbleeding.  The port was then removed from its pocket. I made sure everything was dry. The incision was closed with 3-0 Vicryl, 4-0 Monocryl subcuticular, and Dermabond.  The patient tolerated the procedure well. There were no complications.  Currie Paris, MD, FACS 12/02/2011 9:08 AM

## 2011-12-02 NOTE — H&P (Signed)
NAME: [name] DOB: 1940/11/30 MRN: [id number]                                                                                      DATE: 10/21/2011  PCP: Michele Mcalpine, MD Referring Provider: No ref. provider found  IMPRESSION:  Un-Needed port  PLAN:   Port-A-Cath removal under IV sedation                 CC: No chief complaint on file.   HPI:  [name] is a 71 y.o.  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  PSH:   has past surgical history that includes Laminectomy (1993); bladder tack (1974); Cholecystectomy (02/23/1991); Tonsillectomy; Rotator cuff repair (8/03); Enterocele repair (06/08); Rotator cuff repair (10/26/2008); Hip surgery (08/27/2010); Breast excisional biopsy (11/10/1988); Breast excisional biopsy (03/04/1983); Breast excisional biopsy (10/09/1980); Breast excisional biopsy (10/18/1979); Breast excisional biopsy (01/02/1994); Vaginal hysterectomy (1974); Colonoscopy; Modified radical mastectomy w/ axillary lymph node dissection; Portacath placement (04/13/2011); Mastectomy (03/23/2011); Drain seroma (05/01/11); Evacuation breast hematoma (07/01/2011); and Breast surgery.  ALLERGIES:   Allergies  Allergen Reactions  . Ceftriaxone Sodium     REACTION: hives  . Cephalexin     REACTION: hives  . Clindamycin     REACTION: hives  . Dilaudid (Hydromorphone Hcl) Itching  . Oxycodone-Acetaminophen     REACTION: hives  . Rocephin (Ceftriaxone Sodium In Dextrose) Hives  .  Sulfonamide Derivatives     REACTION: hives    MEDICATIONS: No current facility-administered medications for this encounter. Marland Kitchen EXAM:   VITAL SIGNS: BP 135/80  Pulse 83  Temp 98 F (36.7 C) (Oral)  Resp 18  Ht 5\' 8"  (1.727 m)  Wt 187 lb (84.823 kg)  BMI 28.43 kg/m2  SpO2 97% GENERAL:  The patient is alert, oriented, and generally healthy-appearing, NAD. Mood and affect are normal.  HEENT:  The head is normocephalic, the eyes nonicteric, the pupils were round regular and equal. EOMs are normal. Pharynx normal. Dentition good.  NECK:  The neck is supple and there are no masses or thyromegaly.  LUNGS: Normal respirations and clear to auscultation.  HEART: Regular rhythm, with no murmurs rubs or gallops. Pulses are intact carotid dorsalis pedis and posterior tibial. No significant varicosities are noted.  BREASTS: Progress unremarkable. Left breast status post mastectomy with recent radiation therapy. There is some increased pigmentation from radiation as well as an open area that has not completely healed yet. Other than that no other issues.  ABDOMEN: Soft, flat, and nontender. No masses or organomegaly is noted. No hernias are noted. Bowel sounds are normal.  EXTREMITIES:  Good range of motion, no edema.   DATA REVIEWED:  No new data    Cristiano Capri J 12/02/2011  CC: No ref. provider found, Michele Mcalpine, MD

## 2011-12-02 NOTE — Anesthesia Preprocedure Evaluation (Signed)
Anesthesia Evaluation  Patient identified by MRN, date of birth, ID band Patient awake    Reviewed: Allergy & Precautions, H&P , NPO status , Patient's Chart, lab work & pertinent test results  Airway Mallampati: II TM Distance: >3 FB Neck ROM: Full    Dental No notable dental hx. (+) Teeth Intact and Dental Advisory Given   Pulmonary neg pulmonary ROS, asthma ,  breath sounds clear to auscultation  Pulmonary exam normal       Cardiovascular hypertension, On Medications Rhythm:Regular Rate:Normal     Neuro/Psych PSYCHIATRIC DISORDERS negative neurological ROS     GI/Hepatic Neg liver ROS, GERD-  Medicated,  Endo/Other  diabetes, Type 1, Insulin Dependent  Renal/GU negative Renal ROS  negative genitourinary   Musculoskeletal   Abdominal   Peds  Hematology negative hematology ROS (+)   Anesthesia Other Findings   Reproductive/Obstetrics negative OB ROS                           Anesthesia Physical Anesthesia Plan  ASA: III  Anesthesia Plan: MAC   Post-op Pain Management:    Induction: Intravenous  Airway Management Planned: Simple Face Mask  Additional Equipment:   Intra-op Plan:   Post-operative Plan:   Informed Consent: I have reviewed the patients History and Physical, chart, labs and discussed the procedure including the risks, benefits and alternatives for the proposed anesthesia with the patient or authorized representative who has indicated his/her understanding and acceptance.   Dental advisory given  Plan Discussed with: CRNA  Anesthesia Plan Comments:         Anesthesia Quick Evaluation

## 2011-12-02 NOTE — Transfer of Care (Signed)
Immediate Anesthesia Transfer of Care Note  Patient: [name]  Procedure(s) Performed: Procedure(s) (LRB) with comments: REMOVAL PORT-A-CATH (N/A)  Patient Location: PACU  Anesthesia Type:MAC  Level of Consciousness: sedated and patient cooperative  Airway & Oxygen Therapy: Patient Spontanous Breathing and Patient connected to face mask oxygen  Post-op Assessment: Report given to PACU RN and Post -op Vital signs reviewed and stable  Post vital signs: Reviewed and stable  Complications: No apparent anesthesia complications

## 2011-12-02 NOTE — Anesthesia Postprocedure Evaluation (Signed)
  Anesthesia Post-op Note  Patient: [name]  Procedure(s) Performed: Procedure(s) (LRB) with comments: REMOVAL PORT-A-CATH (N/A)  Patient Location: PACU  Anesthesia Type:MAC  Level of Consciousness: awake, alert  and oriented  Airway and Oxygen Therapy: Patient Spontanous Breathing  Post-op Pain: mild  Post-op Assessment: Post-op Vital signs reviewed, Patient's Cardiovascular Status Stable, Respiratory Function Stable, Patent Airway and No signs of Nausea or vomiting  Post-op Vital Signs: Reviewed and stable  Complications: No apparent anesthesia complications

## 2011-12-03 ENCOUNTER — Encounter (HOSPITAL_BASED_OUTPATIENT_CLINIC_OR_DEPARTMENT_OTHER): Payer: Self-pay | Admitting: Surgery

## 2011-12-03 ENCOUNTER — Encounter: Payer: Medicare Other | Admitting: Physical Therapy

## 2011-12-04 ENCOUNTER — Other Ambulatory Visit: Payer: Self-pay | Admitting: Pulmonary Disease

## 2011-12-07 ENCOUNTER — Encounter: Payer: Medicare Other | Admitting: Physical Therapy

## 2011-12-08 ENCOUNTER — Telehealth: Payer: Self-pay | Admitting: Endocrinology

## 2011-12-08 NOTE — Telephone Encounter (Signed)
Is the problem with BP or blood sugar?

## 2011-12-08 NOTE — Telephone Encounter (Signed)
Increase humalog to 10 units 3 times a day (just before each meal)

## 2011-12-08 NOTE — Telephone Encounter (Signed)
Pt wanted to let you know that she is traveling today and really needs to speak with someone ASAP, thanks

## 2011-12-08 NOTE — Telephone Encounter (Signed)
Spoke with pt it is her blood glucose. She is unable to get her levels down. She checked it 5 times yesterday and it was over 200 each time. The last time it finally came down to 142. This morning at 9:30 it was 180 and at 11:30 it was at 298. She is out of town and doesn't know what to do. Please advise.

## 2011-12-08 NOTE — Telephone Encounter (Signed)
Pt called and stated that the new meds she is on is not working in bring her BP down. Please advise.

## 2011-12-08 NOTE — Telephone Encounter (Signed)
See previous messge

## 2011-12-08 NOTE — Telephone Encounter (Signed)
Pt advise dper dr.elliosn's messge and states an understanding

## 2011-12-08 NOTE — Telephone Encounter (Signed)
Pt called and stated that the new meds she is on is not working in bring her BP down, She needs someone to call her, pls.

## 2011-12-09 ENCOUNTER — Encounter: Payer: Medicare Other | Admitting: Physical Therapy

## 2011-12-11 ENCOUNTER — Encounter: Payer: Medicare Other | Admitting: Physical Therapy

## 2011-12-16 ENCOUNTER — Ambulatory Visit (INDEPENDENT_AMBULATORY_CARE_PROVIDER_SITE_OTHER): Payer: Medicare Other | Admitting: Surgery

## 2011-12-16 ENCOUNTER — Encounter (INDEPENDENT_AMBULATORY_CARE_PROVIDER_SITE_OTHER): Payer: Self-pay | Admitting: Surgery

## 2011-12-16 VITALS — BP 114/70 | HR 80 | Temp 98.4°F | Resp 14 | Ht 68.0 in | Wt 186.0 lb

## 2011-12-16 DIAGNOSIS — Z9889 Other specified postprocedural states: Secondary | ICD-10-CM

## 2011-12-16 NOTE — Patient Instructions (Signed)
See me again in six months 

## 2011-12-16 NOTE — Progress Notes (Signed)
NAME: [name]                                            DOB: September 18, 1940 DATE: 12/16/2011                                                  MRN: [id number]  CC: Post op   HPI: This patient comes in for post op follow-up .Sheunderwent Port removal on 12/02/11. She feels that she is doing well.She completed radiation for her left breast cancer  PE:  VITAL SIGNS: BP 114/70  Pulse 80  Temp 98.4 F (36.9 C) (Temporal)  Resp 14  Ht 5\' 8"  (1.727 m)  Wt 186 lb (84.369 kg)  BMI 28.28 kg/m2  General: The patient appears to be healthy, NAD Port site incision healing nicely. Left breast with acute radiation changes  DATA REVIEWED: No new  IMPRESSION: The patient is doing well S/P port removal.    PLAN: See in 6 months for breast cancer follow up.  She is due a mammogram in January, 2014

## 2011-12-17 ENCOUNTER — Other Ambulatory Visit: Payer: Self-pay | Admitting: Adult Health

## 2011-12-22 ENCOUNTER — Encounter: Payer: Self-pay | Admitting: Radiation Oncology

## 2011-12-22 ENCOUNTER — Ambulatory Visit
Admission: RE | Admit: 2011-12-22 | Discharge: 2011-12-22 | Disposition: A | Discharge status: Home / Self Care | Payer: Medicare Other | Source: Ambulatory Visit | Attending: Radiation Oncology | Admitting: Radiation Oncology

## 2011-12-22 VITALS — BP 152/64 | HR 80 | Temp 98.1°F | Resp 20 | Wt 187.6 lb

## 2011-12-22 DIAGNOSIS — C50319 Malignant neoplasm of lower-inner quadrant of unspecified female breast: Secondary | ICD-10-CM | POA: Insufficient documentation

## 2011-12-22 HISTORY — DX: Personal history of irradiation: Z92.3

## 2011-12-22 NOTE — Progress Notes (Signed)
Patient here follow up left breast ca rad txs: 10/07/11/--11/24/2011 Alert,oriented x3, left breast still peeling,dry skin, but healed, occasional twinge in breast , slight fatigue,better, hasn't scheduled next mammogram as yet On femara 2.5mg  daily and still gets hot flashes 3:42 PM

## 2011-12-22 NOTE — Progress Notes (Signed)
Followup note:  [name] returns today approximately 1 month following completion of post mastectomy radiation therapy in the management of her pathologic stage T3 N2 invasive lobular carcinoma of the left breast. She is without complaints today. She tells me she'll see Dr. Welton Flakes in January. She recently saw Dr. Jamey Ripa for removal of her Port-A-Cath and he will see her back in 6 months. She is on adjuvant Femara.  Physical examination: Alert and oriented. Wt Readings from Last 3 Encounters:  12/22/11 187 lb 9.6 oz (85.095 kg)  12/16/11 186 lb (84.369 kg)  12/02/11 187 lb (84.823 kg)   Temp Readings from Last 3 Encounters:  12/22/11 98.1 F (36.7 C) Oral  12/16/11 98.4 F (36.9 C) Temporal  12/02/11 97.8 F (36.6 C) Oral   BP Readings from Last 3 Encounters:  12/22/11 152/64  12/16/11 114/70  12/02/11 143/65   Pulse Readings from Last 3 Encounters:  12/22/11 80  12/16/11 80  12/02/11 64   Head and neck examination: She has regrowth of hair.  Nodes: Without palpable cervical, supraclavicular, or axillary lymphadenopathy. Chest: There is residual hyperpigmentation the skin along the left chest wall/axilla with diffuse dry desquamation. Lungs are clear. Right breast without masses or lesions. Abdomen without hepatomegaly. Extremities: Without edema.  Impression: Satisfactory progress.  Plan: Followup through Drs. Welton Flakes and [facility] [employer] [facility].

## 2012-01-12 ENCOUNTER — Ambulatory Visit: Payer: Medicare Other | Admitting: Endocrinology

## 2012-01-18 ENCOUNTER — Telehealth: Payer: Self-pay | Admitting: Pulmonary Disease

## 2012-01-18 ENCOUNTER — Ambulatory Visit: Payer: Self-pay | Admitting: Endocrinology

## 2012-01-18 MED ORDER — PROMETHAZINE HCL 25 MG RE SUPP: 25.0000 mg | RECTAL | Status: DC | PRN

## 2012-01-18 MED ORDER — PROMETHAZINE HCL 25 MG PO TABS: 25.0000 mg | ORAL_TABLET | ORAL | Status: DC | PRN

## 2012-01-18 NOTE — Telephone Encounter (Signed)
Pt c/o nausea and vomiting since this morning and now has diarrhea as well. Pt denies any fever but does have chills. She is unable to keep any food or liquids down. Pls advise. Allergies  Allergen Reactions  . Ceftriaxone Sodium     REACTION: hives  . Cephalexin     REACTION: hives  . Clindamycin     REACTION: hives  . Dilaudid (Hydromorphone Hcl) Itching  . Oxycodone-Acetaminophen     REACTION: hives  . Rocephin (Ceftriaxone Sodium In Dextrose) Hives  . Sulfonamide Derivatives     REACTION: hives

## 2012-01-18 NOTE — Telephone Encounter (Signed)
rx sent. Pt is aware. Talise Sligh, CMA  

## 2012-01-18 NOTE — Telephone Encounter (Signed)
Per SN---ok to send in phenergan 25 mg  #12    1 inserted every 4 hours prn nausea and also send in phenergan 25 mg tablets  #12  1 po every 4 hours as needed for nausea.   thanks

## 2012-01-25 ENCOUNTER — Telehealth: Payer: Self-pay | Admitting: Pulmonary Disease

## 2012-01-25 MED ORDER — AZITHROMYCIN 250 MG PO TABS: ORAL_TABLET | ORAL | Status: DC

## 2012-01-25 NOTE — Telephone Encounter (Signed)
I spoke with pt. C/o cough w/ yellow phlem, sore throat, nasal congestion, PND x 2 days. No f/c/s/n/v. She is requesting to have something called in. Please advise SN thanks Last OV 01/14/11 No pending OV  Allergies  Allergen Reactions  . Ceftriaxone Sodium     REACTION: hives  . Cephalexin     REACTION: hives  . Clindamycin     REACTION: hives  . Dilaudid (Hydromorphone Hcl) Itching  . Oxycodone-Acetaminophen     REACTION: hives  . Rocephin (Ceftriaxone Sodium In Dextrose) Hives  . Sulfonamide Derivatives     REACTION: hives

## 2012-01-25 NOTE — Telephone Encounter (Signed)
Per SN---ok to call in zpak #1  Take as directed, use the lozenges otc, delsym 2 tsp bid.  thanks

## 2012-01-25 NOTE — Telephone Encounter (Signed)
Pt aware of SN recs. Nothing further was needed

## 2012-02-25 ENCOUNTER — Encounter: Payer: Self-pay | Admitting: Physician Assistant

## 2012-02-25 ENCOUNTER — Telehealth: Payer: Self-pay | Admitting: Oncology

## 2012-02-25 ENCOUNTER — Other Ambulatory Visit (HOSPITAL_BASED_OUTPATIENT_CLINIC_OR_DEPARTMENT_OTHER): Payer: Medicare Other | Admitting: Lab

## 2012-02-25 ENCOUNTER — Ambulatory Visit (HOSPITAL_BASED_OUTPATIENT_CLINIC_OR_DEPARTMENT_OTHER): Payer: Medicare Other | Admitting: Physician Assistant

## 2012-02-25 VITALS — BP 129/76 | HR 97 | Temp 97.8°F | Resp 20 | Ht 68.0 in | Wt 183.3 lb

## 2012-02-25 DIAGNOSIS — Z170 Estrogen receptor positive status [ER+]: Secondary | ICD-10-CM

## 2012-02-25 DIAGNOSIS — C50319 Malignant neoplasm of lower-inner quadrant of unspecified female breast: Secondary | ICD-10-CM

## 2012-02-25 LAB — CBC WITH DIFFERENTIAL/PLATELET
Basophils Absolute: 0.1 10*3/uL (ref 0.0–0.1)
EOS%: 2.1 % (ref 0.0–7.0)
HCT: 41.6 % (ref 34.8–46.6)
HGB: 13.9 g/dL (ref 11.6–15.9)
MCH: 29.3 pg (ref 25.1–34.0)
MCV: 87.5 fL (ref 79.5–101.0)
MONO%: 8.5 % (ref 0.0–14.0)
NEUT%: 70.8 % (ref 38.4–76.8)
RDW: 14.2 % (ref 11.2–14.5)

## 2012-02-25 LAB — COMPREHENSIVE METABOLIC PANEL (CC13)
AST: 12 U/L (ref 5–34)
Alkaline Phosphatase: 125 U/L (ref 40–150)
BUN: 11 mg/dL (ref 7.0–26.0)
Creatinine: 0.8 mg/dL (ref 0.6–1.1)
Total Bilirubin: 0.68 mg/dL (ref 0.20–1.20)

## 2012-02-25 NOTE — Patient Instructions (Signed)
PLease take your Femara at night at the same time each night. Return to our office in 6 months. Please follow up with Dr. Everardo All on your Diabetes.

## 2012-02-25 NOTE — Progress Notes (Signed)
[name]  Telephone:(336) [phone] Fax:(336) [phone]   OFFICE PROGRESS NOTE   Cc:  Michele Mcalpine, MD [facility] [address] [place] [address] [place] [address]  Dr. Calton Dach  Dr. Chipper Herb  Dr. Romero Belling   DIAGNOSIS: 72 year old female with stage III a invasive lobular carcinoma of the left breast status post mastectomy with left axillary lymph node dissection performed on 03/23/2011.   PRIOR THERAPY:  #1 patient was originally seen by me on 03/05/2011 for a invasive lobular carcinoma of the left breast the tumor was 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 Her clinical staging was stage IIB (T3, N0, M0).   #2 the patient has gone on to have a left breast mastectomy with axillary lymph node dissection on 03/23/2011. The final pathology revealed a 6.0 cm invasive lobular carcinoma with lymphovascular invasion and perineural invasion. Invasive tumor was 2.9 cm from the nearest margin lobular carcinoma in situ was noted. Initially 6 lymph nodes were positive for metastatic carcinoma. Tumor deposits were 0.5 0.8 0.7 0.7 0.3 and 1.0 cm focal extracapsular tumor extension was noted. Tumor was grade 3. The final lymph node count was 16 9 of which were positive for metastatic disease. Tumor was estrogen receptor +100% progesterone receptor +100% HER-2/neu negative with a ratio 1.31 proliferation marker 18% final pathologic staging was p T3a pN2a, Stage IIIa.   #3.patient is status post 4 cycles of FEC combination chemotherapy starting from 04/17/2011 may 2013. Her course was complicated by development of febrile neutropenia sepsis pancytopenia and mastectomy site cellulitis and chest wall abscess requiring incision and drainage.   #4 patient had radiation to the L axilla and L chest wall under the direction of Dr. Dayton Scrape from 10/07/2011 to 11/24/2011 as follows:Left chest wall and regional lymph nodes 5040 cGy 28  sessions left mastectomy scar/chest wall boost 1000 cGy 5 sessions      #5 Port-A-Cath removal on 12/02/2011   CURRENT THERAPY:  letrozole 2.5 mg daily adjuvantly since 10/16/11  [name] 72 y.o. female returns for a follow up appointment. Overall, she has been tolerating Femara therapy well. She denies any  bone pain,  myalgia, hot flashes, vaginal discharge or increased fatigue . Denies nausea or headache. No xerostomia. Main complaint is intermittent dizziness, but she states it may be related to elevated blood sugars, this morning being at 2019. She is taking Femara during the day. In addition, she is recovering from bronchitis, associated with non productive cough. Patient is to see Dr. Everardo All in [name] upcoming appointment, at which time she is to address these issues.  SHe haas completed her radiation therapy to the L chest wall and axillary area (Dr. Dayton Scrape) on 10/22, follow by [facility] A Cath removal on 12/02/11. She denies any chest wall pain, other than chronic radiation tenderness.    Past Medical History  Diagnosis Date  . DIABETES MELLITUS 08/10/2007  . 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 HYPERCHOLESTEROLEMIA 12/17/2006  . OBESITY 06/04/2008  . ANXIETY 06/04/2008  . DEPRESSION 08/10/2007  . HYPERTENSION 12/17/2006  . ASTHMATIC BRONCHITIS, ACUTE 06/04/2008  . ALLERGIC RHINITIS 12/13/2007  . GERD 12/17/2006  . DEGENERATIVE JOINT DISEASE 12/17/2006  . Asthma   . Blood transfusion 1964    post childbirth  . 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 History of chemotherapy   . History of radiation therapy 10/07/11-11/24/11    left  breast,5040 cGy/28 sessions,l chest wall boost=1000cGy/5 sesions    Past Surgical History  Procedure Date  . Laminectomy 1993    s/p-Dr. Jule Ser  . Bladder tack 1974  . Cholecystectomy 02/23/1991    LC - Dr Jamey Ripa  . Tonsillectomy   . Rotator cuff repair 8/03    right, Dr. Shelle Iron  . Enterocele repair 06/08    Dr.  Estanislado Pandy  . Rotator cuff repair 10/26/2008    left, Dr. Rayburn Ma  . Hip surgery 08/27/2010    DR. Despina Hick  . Breast excisional biopsy 11/10/1988    left benign Dr Jamey Ripa  . Breast excisional biopsy 03/04/1983    Right - two areas - Dr Jamey Ripa  . Breast excisional biopsy 10/09/1980    right - Dr Jamey Ripa  . Breast excisional biopsy 10/18/1979    left - Dr Jamey Ripa  . Breast excisional biopsy 01/02/1994    right - Dr Jamey Ripa  . Vaginal hysterectomy 1974    partial   . Colonoscopy   . Modified radical mastectomy w/ axillary lymph node dissection     03-30-11 LT  . Portacath placement 04/13/2011    Procedure: INSERTION PORT-A-CATH;  Surgeon: Currie Paris, MD;  Location: [facility];  Service: General;  Laterality: N/A;  porta cath placement,removal of J-P drain  . Mastectomy 03/23/2011    left  . Drain seroma 05/01/11    Chronic seroma @mastectomy  site  . Evacuation breast hematoma 07/01/2011    Procedure: EVACUATION HEMATOMA BREAST;  Surgeon: Currie Paris, MD;  Location: [facility];  Service: General;  Laterality: Left;  Drainage of left mastectomy seroma  . Breast surgery   . Port-a-cath removal 12/02/2011    Procedure: REMOVAL PORT-A-CATH;  Surgeon: Currie Paris, MD;  Location: [facility];  Service: General;  Laterality: N/A;    Current Outpatient Prescriptions  Medication Sig Dispense Refill  . acyclovir (ZOVIRAX) 200 MG capsule TAKE TWO CAPSULES BY MOUTH THREE TIMES DAILY  120 capsule  0  . ALPRAZolam (XANAX) 0.5 MG tablet TAKE ONE-HALF TO ONE TABLET BY MOUTH THREE TIMES DAILY AS NEEDED FOR  NERVES  90 tablet  1  . amLODipine (NORVASC) 10 MG tablet Take 1 tablet (10 mg total) by mouth daily.  30 tablet  11  . atorvastatin (LIPITOR) 40 MG tablet Take 1 tablet (40 mg total) by mouth daily.  30 tablet  11  . azithromycin (ZITHROMAX) 250 MG tablet Take as directed  6 tablet  0  . benazepril (LOTENSIN) 40 MG tablet Take 1 tablet (40 mg total) by mouth  daily.  30 tablet  11  . Insulin Glargine (LANTUS SOLOSTAR [place]) Inject 15 Units into the skin daily.       . Insulin Lispro, Human, (HUMALOG KWIKPEN [place]) Inject 10 Units into the skin 3 (three) times daily before meals.       Marland Kitchen letrozole (FEMARA) 2.5 MG tablet Take 2.5 mg by mouth daily.       Marland Kitchen omeprazole (PRILOSEC) 20 MG capsule Take 1 capsule (20 mg total) by mouth daily.  30 capsule  11  . promethazine (PHENERGAN) 25 MG suppository Place 1 suppository (25 mg total) rectally every 4 (four) hours as needed for nausea.  12 each  0  . promethazine (PHENERGAN) 25 MG tablet Take 1 tablet (25 mg total) by mouth every 4 (four) hours as needed for nausea.  12 tablet  0  . sertraline (ZOLOFT) 50 MG tablet Take 1 tablet (50 mg  total) by mouth daily.  30 tablet  5  . [DISCONTINUED] prochlorperazine (COMPAZINE) 10 MG tablet Take 1 tablet (10 mg total) by mouth every 6 (six) hours as needed (Nausea or vomiting).  30 tablet  1    ALLERGIES:  is allergic to ceftriaxone sodium; cephalexin; clindamycin; dilaudid; oxycodone-acetaminophen; rocephin; and sulfonamide derivatives.  REVIEW OF SYSTEMS:  No respiratory or cardiac complaints.  The rest of the 14-point review of system was negative.   Filed Vitals:   02/25/12 0926  BP: 129/76  Pulse: 97  Temp: 97.8 F (36.6 C)  Resp: 20   Wt Readings from Last 3 Encounters:  02/25/12 183 lb 4.8 oz (83.144 kg)  12/22/11 187 lb 9.6 oz (85.095 kg)  12/16/11 186 lb (84.369 kg)     PHYSICAL EXAMINATION:  BP 129/76  Pulse 97  Temp 97.8 F (36.6 C) (Oral)  Resp 20  Ht 5\' 8"  (1.727 m)  Wt 183 lb 4.8 oz (83.144 kg)  BMI 27.87 kg/m2 GENERAL: Well developed, well nourished, in no acute distress.  EENT: No ocular or oral lesions. No stomatitis.  RESPIRATORY: Lungs are clear to auscultation bilaterally  CARDIAC: No murmur, rub or tachycardia. No upper or lower extremity edema.  GI: Abdomen is soft, no palpable hepatosplenomegaly. No fluid wave. No  tenderness. Musculoskeletal: No kyphosis, no tenderness over the spine, ribs or hips. Lymph: No cervical, infraclavicular, axillary or inguinal adenopathy. Neuro: No focal neurological deficits. Psych: Alert and oriented X 3, appropriate mood and affect.  BREAST EXAM:Left mastectomy site is well healed, with some tenderness at the radiation regions with residual hyperpigmentation the skin  without any visible new masses. R breast without any masses. No axillary lymphadenopathy.    LABORATORY/RADIOLOGY DATA:  Lab Results  Component Value Date   WBC 6.6 02/25/2012   HGB 13.9 02/25/2012   HCT 41.6 02/25/2012   PLT 207 02/25/2012   GLUCOSE 232* 02/25/2012   CHOL 305* 02/06/2011   TRIG 272.0* 02/06/2011   HDL 43.10 02/06/2011   LDLDIRECT 212.7 02/06/2011   ALKPHOS 125 02/25/2012   ALT 17 02/25/2012   AST 12 02/25/2012   NA 141 02/25/2012   K 3.5 02/25/2012   CL 102 02/25/2012   CREATININE 0.8 02/25/2012   BUN 11.0 02/25/2012   CO2 28 02/25/2012   INR 0.99 08/20/2010   HGBA1C 8.4* 09/28/2011   MICROALBUR 5.4* 05/31/2008    No results found.     ASSESSMENT   1. [name] 72 y.o. female with   #1 invasive lobular carcinoma of the left breast status post mastectomy with axillary lymph node dissection. The final pathology revealed a T3 N2 way invasive lobular carcinoma. The tumor measured 6.0 cm 9 of 16 lymph nodes were positive for metastatic disease. Tumor was ER positive PR positive HER-2/neu negative proliferation marker 18%.   #2 patient is now status post 4 cycles of combination chemotherapy consisting of FEC. Her last treatment was given on May 2013. She did require recurrent hospitalizations throughout this treatment. She's had recurrent infections most recently chest wall [place]. She did become significantly pancytopenic.   #3 Antiestrogen therapy for her estrogen receptor positive tumor in the form of  letrozole 2.5 mg daily.   #4 patient has completed  radiation therapy on  11/24/2011  #5 Patient had  Her Port-A-Cath removed by  Dr. Jamey Ripa on 12/02/2011.    PLAN: 1. Ms Howse [facility] up visit in 6 months. In  the interim, she is to continue to take letrozole 2.5 mg , but has been instructed to take it at night.   2. Patient is due to see Dr. Everardo All regarding her elevated blood sugars that may be contributing to her intermittent dizziness.

## 2012-02-25 NOTE — Telephone Encounter (Signed)
gv pt appt schedule for July.  

## 2012-03-04 ENCOUNTER — Encounter: Payer: Self-pay | Admitting: Endocrinology

## 2012-03-04 ENCOUNTER — Ambulatory Visit (INDEPENDENT_AMBULATORY_CARE_PROVIDER_SITE_OTHER): Payer: Medicare Other | Admitting: Endocrinology

## 2012-03-04 VITALS — BP 130/80 | HR 98 | Wt 184.0 lb

## 2012-03-04 DIAGNOSIS — E119 Type 2 diabetes mellitus without complications: Secondary | ICD-10-CM

## 2012-03-04 NOTE — Progress Notes (Signed)
Subjective:    Patient ID: [name], female    DOB: 1940/02/05, 72 y.o.   MRN: [id number]  HPI Pt returns for f/u of type 2 DM (dx'ed 2007; no known complications).  Last week, she finished XRT, and chemotx.  She has increased the lantus to 30 units qd.   no cbg record, but states cbg's vary from 150-430.  There is no trend throughout the day.   Past Medical History  Diagnosis Date  . DIABETES MELLITUS 08/10/2007  . 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 HYPERCHOLESTEROLEMIA 12/17/2006  . OBESITY 06/04/2008  . ANXIETY 06/04/2008  . DEPRESSION 08/10/2007  . HYPERTENSION 12/17/2006  . ASTHMATIC BRONCHITIS, ACUTE 06/04/2008  . ALLERGIC RHINITIS 12/13/2007  . GERD 12/17/2006  . DEGENERATIVE JOINT DISEASE 12/17/2006  . Asthma   . Blood transfusion 1964    post childbirth  . 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 History of chemotherapy   . History of radiation therapy 10/07/11-11/24/11    left breast,5040 cGy/28 sessions,l chest wall boost=1000cGy/5 sesions    Past Surgical History  Procedure Date  . Laminectomy 1993    s/p-Dr. Jule Ser  . Bladder tack 1974  . Cholecystectomy 02/23/1991    LC - Dr Jamey Ripa  . Tonsillectomy   . Rotator cuff repair 8/03    right, Dr. Shelle Iron  . Enterocele repair 06/08    Dr. Estanislado Pandy  . Rotator cuff repair 10/26/2008    left, Dr. Rayburn Ma  . Hip surgery 08/27/2010    DR. Despina Hick  . Breast excisional biopsy 11/10/1988    left benign Dr Jamey Ripa  . Breast excisional biopsy 03/04/1983    Right - two areas - Dr Jamey Ripa  . Breast excisional biopsy 10/09/1980    right - Dr Jamey Ripa  . Breast excisional biopsy 10/18/1979    left - Dr Jamey Ripa  . Breast excisional biopsy 01/02/1994    right - Dr Jamey Ripa  . Vaginal hysterectomy 1974    partial   . Colonoscopy   . Modified radical mastectomy w/ axillary lymph node dissection     03-30-11 LT  . Portacath placement 04/13/2011    Procedure: INSERTION PORT-A-CATH;  Surgeon: Currie Paris, MD;  Location: [facility];  Service: General;  Laterality: N/A;  porta cath placement,removal of J-P drain  . Mastectomy 03/23/2011    left  . Drain seroma 05/01/11    Chronic seroma @mastectomy  site  . Evacuation breast hematoma 07/01/2011    Procedure: EVACUATION HEMATOMA BREAST;  Surgeon: Currie Paris, MD;  Location: [facility];  Service: General;  Laterality: Left;  Drainage of left mastectomy seroma  . Breast surgery   . Port-a-cath removal 12/02/2011    Procedure: REMOVAL PORT-A-CATH;  Surgeon: Currie Paris, MD;  Location: [facility];  Service: General;  Laterality: N/A;    History   Social History  . Marital Status: Married    Spouse Name: N/A    Number of Children: 3  . Years of Education: N/A   Occupational History  .     Social History Main Topics  . Smoking status: Never Smoker   . Smokeless tobacco: Never Used  . Alcohol Use: No  . Drug Use: No  . Sexually Active: Not Currently   Other Topics Concern  . 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 acyclovir (ZOVIRAX) 200 MG capsule TAKE TWO CAPSULES BY MOUTH THREE TIMES DAILY  120 capsule  0  . ALPRAZolam (XANAX) 0.5 MG tablet TAKE ONE-HALF TO ONE TABLET BY MOUTH THREE TIMES DAILY AS NEEDED FOR  NERVES  90 tablet  1  . amLODipine (NORVASC) 10 MG tablet Take 1 tablet (10 mg total) by mouth daily.  30 tablet  11  . atorvastatin (LIPITOR) 40 MG tablet Take 1 tablet (40 mg total) by mouth daily.  30 tablet  11  . azithromycin (ZITHROMAX) 250 MG tablet Take as directed  6 tablet  0  . benazepril (LOTENSIN) 40 MG tablet Take 1 tablet (40 mg total) by mouth daily.  30 tablet  11  . Insulin Glargine (LANTUS SOLOSTAR [place]) Inject 15 Units into the skin  daily.       . Insulin Lispro, Human, (HUMALOG KWIKPEN [place]) 3 times a day (just before each meal) 20-10-20 units      . letrozole (FEMARA) 2.5 MG tablet Take 2.5 mg by mouth daily.       Marland Kitchen omeprazole (PRILOSEC) 20 MG capsule Take 1 capsule (20 mg total) by mouth daily.  30 capsule  11  . promethazine (PHENERGAN) 25 MG suppository Place 1 suppository (25 mg total) rectally every 4 (four) hours as needed for nausea.  12 each  0  . promethazine (PHENERGAN) 25 MG tablet Take 1 tablet (25 mg total) by mouth every 4 (four) hours as needed for nausea.  12 tablet  0  . sertraline (ZOLOFT) 50 MG tablet Take 1 tablet (50 mg total) by mouth daily.  30 tablet  5  . [DISCONTINUED] prochlorperazine (COMPAZINE) 10 MG tablet Take 1 tablet (10 mg total) by mouth every 6 (six) hours as needed (Nausea or vomiting).  30 tablet  1    Allergies  Allergen Reactions  . Ceftriaxone Sodium     REACTION: hives  . Cephalexin     REACTION: hives  . Clindamycin     REACTION: hives  . Dilaudid (Hydromorphone Hcl) Itching  . Oxycodone-Acetaminophen     REACTION: hives  . Rocephin (Ceftriaxone Sodium In Dextrose) Hives  . Sulfonamide Derivatives     REACTION: hives    Family History  Problem Relation Age of Onset  . COPD Mother   . Arthritis Mother   . Emphysema Mother   . Mental illness Paternal Grandfather   . Heart disease Other     Sibling  . Diabetes Other     Sibling   . Diabetes Brother   . Cancer Brother     prostate  . Cancer Father     malignant brain tumor   BP 130/80  Pulse 98  Wt 184 lb (83.462 kg)  SpO2 95%  Review of Systems denies hypoglycemia    Objective:   Physical Exam VITAL SIGNS:  See vs page GENERAL: no distress SKIN:  Insulin injection sites at the anterior abdomen are normal, except for a few ecchymoses     Assessment & Plan:  DM: Based on the pattern of her cbg's, she needs some adjustment in her therapy

## 2012-03-04 NOTE — Patient Instructions (Addendum)
check your blood sugar 2 times a day.  vary the time of day when you check, between before the 3 meals, after meals, and at bedtime.  also check if you have symptoms of your blood sugar being too high or too low.  please keep a record of the readings and bring it to your next appointment here.  please call us sooner if your blood sugar goes below 70, or if it stays over 200.   continue lantus, 15 units per day.  change humalog to 3 times a day (just before each meal) 20-10-20 units.   Please come back for a follow-up appointment in 1 month.

## 2012-03-15 ENCOUNTER — Ambulatory Visit: Payer: Medicare Other | Admitting: Adult Health

## 2012-03-15 ENCOUNTER — Encounter: Payer: Self-pay | Admitting: Adult Health

## 2012-03-15 ENCOUNTER — Ambulatory Visit (INDEPENDENT_AMBULATORY_CARE_PROVIDER_SITE_OTHER): Payer: Medicare Other | Admitting: Adult Health

## 2012-03-15 ENCOUNTER — Ambulatory Visit (INDEPENDENT_AMBULATORY_CARE_PROVIDER_SITE_OTHER)
Admission: RE | Admit: 2012-03-15 | Discharge: 2012-03-15 | Disposition: A | Discharge status: Home / Self Care | Payer: Medicare Other | Source: Ambulatory Visit | Attending: Adult Health | Admitting: Adult Health

## 2012-03-15 VITALS — BP 142/84 | HR 83 | Temp 99.7°F | Ht 68.0 in | Wt 186.8 lb

## 2012-03-15 DIAGNOSIS — J209 Acute bronchitis, unspecified: Secondary | ICD-10-CM

## 2012-03-15 DIAGNOSIS — I10 Essential (primary) hypertension: Secondary | ICD-10-CM

## 2012-03-15 MED ORDER — AMOXICILLIN-POT CLAVULANATE 875-125 MG PO TABS: 1.0000 | ORAL_TABLET | Freq: Two times a day (BID) | ORAL | Status: AC

## 2012-03-15 MED ORDER — HYDROCODONE-HOMATROPINE 5-1.5 MG/5ML PO SYRP: 5.0000 mL | ORAL_SOLUTION | Freq: Four times a day (QID) | ORAL | Status: DC | PRN

## 2012-03-15 NOTE — Progress Notes (Signed)
Subjective:     Patient ID: [name], female   DOB: 12/24/1940, 72 y.o.   MRN: [id number]  HPI  72 y/o WF here for a follow up visit... she has multiple medical problems including HBP, Chol, DM, Obesity, etc...  ~  March 05, 2010:  51mo ROV >     [name] saw DrEllison for her DM 6/11> she stopped her Glimep on her own due to ?hypoglycemia ("it brings me down too quick") & she wanted to stay on the Metformin & Januvia alone; her BS has been >200 at home and A1c ~8 on checks last yr;  she is not on diet ("I crave sweets") & not exercising;  she is asking about Victoza to help her lose wt & we discussed a comprehensive program of diet (w/ nutritional counselling), exercise, & meds= Metformin500Bid + Victoza 0.6 ==> 1.2mg  daily.    She notes continue Ortho prob w/ bursitis etc; had shot in right hip recently & may need surg per [name] she says.    She notes that she switched Gastroenterologists to "the oriental lady" meaning ?DrNatt-Mann & apparently had EGD & Colonoscopy ?several yrs ago, we don't have these reports, nothing avail in [place], she states "several polyps removed".  ~  January 14, 2011:  76mo ROV & she is c/o recent bronchitis w/ cough, green sputum production, & aching/ sore/ HA/ and sl wheezy & SOB; she had ZPak called in x2 & has been taking Mucinex/ Fluids/ etc; finally went to an [facility] & was given Levaquin & that's when she started to improve she says; she also had the flu vaccine 9/12; she notes that she is finally better & approaching her baseline...  HBP> on Amlodip10, Benazepril40, HCT25 (takes 1/2-1 prn w/ K20/d); BP= 130/72 & she notes that she only take the diuretic in the summer "when I swell"; denies CP, palpit, dizzy, SOB, edema...  CHOL> prev on Cres40 but she stopped on her own "couldn't afford it" & she didn't call for alternative Rx; we decided to check FLP on diet alone & go from there (she will ret for FLP); discussed Lipid Clinic for her...  DM>  Followed by DrEllison but last seen 2/12 on Metform & Victoza; he tried to incr her Metformin to [facility] but she has since decreased the Metform500Bid & stopped the Victoza; she tells me she is taking Januvia50/d "I have samples" she says; she says home accuchecks are "around 150" but no recent labs avail & she will ret for fasting blood work...  OBESITY> weight is 198# which is down 5# over the last year; she is congratulated & encouraged to keep up the great work; we reviewed low carb low fat diet & exercise prescription...  GERD/ Polyps> Followed by Rocco Pauls but we don't have any notes from her; on Prilosec20 & she denies GI symptoms at this time...  Hx Urinary Incont> Followed by [name] GYN & [name] Urology...  DJD/ LBP> she had right hip bursectomy 7/12 by [name] for intractable right hip bursitis; she takes Naprosyn as needed...  Anxiety/ Depression> on Zoloft 50mg /d and Xanax 0,5mg  but states that she rarely takes it...   03/15/2012 Acute OV  Complains of prod cough with yellow mucus, increased SOB, occasional tightness in chest x26months - denies wheezing, f/c/s.  has taken 1 round zpak and pred pak at onset Continues to have a recurrent cough over the last 2 months. Patient reports that she initially took a Z-Pak and a prednisone taper without much  help. She has had some production with thick, yellow mucus. At times. She denies any hemoptysis, orthopnea, PND, or leg swelling. She's been using multiple over-the-counter cold and cough medications without much relief.             Problem List:   ALLERGIC RHINITIS (ICD-477.9) - uses OTC meds Prn...  Hx of ASTHMATIC BRONCHITIS, ACUTE (ICD-466.0) - uses NEB w/ Albuterol Prn & she reports breathing better since the last acute exac... 12/12: she reports bronchitic exac that didn't respond to 2 ZPaks but got better after Levaquin given via ER...  HYPERTENSION (ICD-401.9) - currently on AMLODIPINE 10mg /d, BENAZEPRIL 40mg /d (and  HCTZ 25mg  + KCl which she takes just as needed for swelling)...  ~  2/12:  BP= 132/66 today and not checking BP at home... denies HA, visual changes, CP, palipit, dizziness, syncope, dyspnea, edema, etc.  ~  Labs 2/12 showed norm Electrolytes, Renal, blood count etc... ~  12/12:  BP= 130/72 & she remains asymptomtic w/o CP, palpit, SOB, edema, etc...  Hx of CHEST PAIN, ATYPICAL (ICD-786.59) - she was seen in the ER by DrPulsipher in 2004 w/ atypical CP... her daughter, Arline Asp, is an Nutritional therapist... ~  NuclearStressTest 9/04 was normal- no scar or ischemia, EF= 73%...  HYPERCHOLESTEROLEMIA (ICD-272.0) - prev on Simva80, but not on much of a diet; Changed to CRESTOR 40mg /d but she stopped this on her own (too $$); asked to take FISH OIL daily. ~  previous labs showed cholesterol as high as 350 in the past... prev on numerous statin meds. ~  FLP 11/08 ?off Lova40 showed TChol 252, TG 236, HDL 49, LDL 163 ~  FLP 10/09 on Simva80 showed TChol 212, TG 124, HDL 64, LDL 119... reminded to take meds regularly. ~  FLP 4/10 on Simva80 showed TChol 206, TG 95, HDL 71, LDL 112... I checked w/ her pharm- filled Simva80 #30 on 2/24 & 4/20. ~  FLP 2/12 on ?Simva80 showed TChol 240, TG 95, HDL 75, LDL 154... rec switch to Cres40. ~  She never ret for FLP on the Crestor & stopped this on her own> encouraged f/u in [facility]id Clinic... ~  DrEllison tried Welchol too but she stopped this as well- ?why...  DIABETES MELLITUS (ICD-250.00) - prev on METFORMIN 500mg Bid & Glimepiride 2mg /d (she stopped on her own "it brings me down to quick") so JANUVIA 100mg /d added by DrEllison; pt not on diet, not exercising, & "I crave sweets"; she requested Victoza 2/12 & this was prescribed but she stopped it on her own & didn't f/u; DrEllison tried to incr her Metformin to 1000mg Bid but she decr back to 500Bid on her own; she also started taking Januvia50mg /d "I had samples"... ~  labs 11/08 (wt= 211#) showed BS= 129, HgA1c= 6.6.Marland Kitchen. ~   labs 10/09 (wt= 216#) showed BS= 109, HgA1c= 6.7... ~  labs 4/10 (wt= 211#) showed BS= 127, HgA1c= 7.3.Marland Kitchen. rec> get on diet, incr Metform Bid. ~  labs 8/10 (wt=213#) showed BS= 154, A1c= 7.1 ~  labs 2/12 (wt=203#) showed BS= 167, A1c= 8.0... Januvia ch to VICTOZA 0.6 =>1.2mg  daily; she subseq stopped on her own... ~  12/12:  On Metform500Bid & Januvia 50mg /d she says; wt is down 5# to 198#; she will ret for fasting blood work & f/u w/ DrEllison...  OBESITY (ICD-278.00) - her weight has been betw 200-215 x yrs... she is 5\' 8"  tall for a BMI~ 32-33... she says her weight is much lower on her home scales... discussed  diet + exercise program required to lose weight...   GERD (ICD-530.81) - on OMEPRAZOLE 20mg /d... she had EGD 11/04 by DrStark w/ 4cmHH, stricture- dilated, gastritis & polyp... ? of COLON POLYPS ? we don't have records from [facility]... ~  she had a normal colonoscopy 11/04 from DrStark x tortuous colon... Rx w/ Levsin Prn. ~  she reports switching gastroenterologist ?DrNatt-Mann w/ EGD & Colon done ?when- she reports several polyps removed.  Hx of URINARY INCONTINENCE (ICD-788.30) - she had a full eval 1/07 by [name] for Urology, and is followed by her GYN, [name]...  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 uses ACYCLOVIR 400mg  Tid for outbreaks.  DEGENERATIVE JOINT DISEASE (ICD-715.90) - she gets freq shots for bursitis from Ortho. Hx of BACK PAIN, LUMBAR (ICD-724.2) - with prev lumbar laminectomy by Raphael Gibney...  ANXIETY (ICD-300.00) - on ALPRAZOLAM 0.5mg  Tid Prn "I don't use it often"... DEPRESSION (ICD-311) - on ZOLOFT 50mg /d...   Past Surgical History  Procedure Laterality Date  . Laminectomy  1993    s/p-Dr. Jule Ser  . Bladder tack  1974  . Cholecystectomy  02/23/1991    [facility] - Dr Jamey Ripa  . Tonsillectomy    . Rotator cuff repair  8/03    right, Dr. Shelle Iron  . Enterocele repair  06/08    Dr. Estanislado Pandy  . Rotator  cuff repair  10/26/2008    left, Dr. Rayburn Ma  . Hip surgery  08/27/2010    DR. Despina Hick  . Breast excisional biopsy  11/10/1988    left benign Dr Jamey Ripa  . Breast excisional biopsy  03/04/1983    Right - two areas - Dr Jamey Ripa  . Breast excisional biopsy  10/09/1980    right - Dr Jamey Ripa  . Breast excisional biopsy  10/18/1979    left - Dr Jamey Ripa  . Breast excisional biopsy  01/02/1994    right - Dr Jamey Ripa  . Vaginal hysterectomy  1974    partial   . Colonoscopy    . Modified radical mastectomy w/ axillary lymph node dissection      03-30-11 LT  . Portacath placement  04/13/2011    Procedure: INSERTION PORT-A-CATH;  Surgeon: Currie Paris, MD;  Location: [facility];  Service: General;  Laterality: N/A;  porta cath placement,removal of J-P drain  . Mastectomy  03/23/2011    left  . Drain seroma  05/01/11    Chronic seroma @mastectomy  site  . Evacuation breast hematoma  07/01/2011    Procedure: EVACUATION HEMATOMA BREAST;  Surgeon: Currie Paris, MD;  Location: [facility];  Service: General;  Laterality: Left;  Drainage of left mastectomy seroma  . Breast surgery    . Port-a-cath removal  12/02/2011    Procedure: REMOVAL PORT-A-CATH;  Surgeon: Currie Paris, MD;  Location: [facility];  Service: General;  Laterality: N/A;    . Outpatient Encounter Prescriptions as of 01/14/2011  Medication Sig Dispense Refill  . albuterol (PROVENTIL) (2.5 MG/3ML) 0.083% nebulizer solution 2.5 mg every 4 (four) hours as needed. As needed for shortness of breath.      . ALPRAZolam (XANAX) 0.5 MG tablet 1/2-1 tablet three times a day as needed for nerves  90 tablet  5  . amLODipine (NORVASC) 10 MG tablet Take 1 tablet (10 mg total) by mouth daily.  30 tablet  11  . benazepril (LOTENSIN) 40 MG tablet Take 1 tablet (40 mg total) by mouth daily.  30  tablet  11  . Cholecalciferol (VITAMIN D) 2000 UNITS tablet Take 2,000 Units by mouth daily.        . hydrochlorothiazide  25 MG tablet 1/2 to 1 tablet by mouth once a day as needed for swelling       . naproxen (NAPROSYN) 250 MG tablet Take 250 mg by mouth daily as needed. As needed for bursitis pain in hip       . omeprazole (PRILOSEC) 20 MG capsule Take 1 capsule (20 mg total) by mouth daily.  30 capsule  11  . potassium chloride SA (K-DUR,KLOR-CON) 20 MEQ tablet Take 20 mEq by mouth daily as needed. (take with the HCTZ)       . sertraline (ZOLOFT) 50 MG tablet Take 1 tablet (50 mg total) by mouth daily.  30 tablet  11  . sitaGLIPtin (JANUVIA) 50 MG tablet Take 50 mg by mouth daily.          Allergies  Allergen Reactions  . Cephalexin     REACTION: hives  . Clindamycin     REACTION: hives  . Dilaudid (Hydromorphone Hcl) Itching  . Oxycodone-Acetaminophen     REACTION: hives  . Rocephin (Ceftriaxone Sodium In Dextrose) Hives  . Sulfonamide Derivatives     REACTION: hives    Current Medications, Allergies, Past Medical History, Past Surgical History, Family History, and Social History were reviewed in [employer] record.    Review of Systems Constitutional:   No  weight loss, night sweats,  Fevers, chills, fatigue, or  lassitude.  HEENT:   No headaches,  Difficulty swallowing,  Tooth/dental problems, or  Sore throat,                No sneezing, itching, ear ache, nasal congestion, post nasal drip,   CV:  No chest pain,  Orthopnea, PND, swelling in lower extremities, anasarca, dizziness, palpitations, syncope.   GI  No heartburn, indigestion, abdominal pain, nausea, vomiting, diarrhea, change in bowel habits, loss of appetite, bloody stools.   Resp:   No wheezing.  No chest wall deformity  Skin: no rash or lesions.  GU: no dysuria, change in color of urine, no urgency or frequency.  No flank pain, no hematuria   MS:  No joint pain or swelling.  No decreased range of motion.  No back pain.  Psych:  No change in mood or affect. No depression or anxiety.  No memory  loss.            Objective:   Physical Exam       WD, Overweight, 72 y/o WF in NAD... GENERAL:  Alert & oriented; pleasant & cooperative... she is sl anxious... HEENT:  [place]/AT, EOM-wnl, PERRLA, EACs-clear, TMs-wnl, NOSE-clear, THROAT-clear & wnl. NECK:  Supple w/ fairROM; no JVD; normal carotid impulses w/o bruits; no thyromegaly or nodules palpated; no lymphadenopathy. CHEST:  Clear to P & A; without wheezes/ rales/ or rhonchi. HEART:  Regular Rhythm; without murmurs/ rubs/ or gallops. ABDOMEN:  Obese, soft & nontender; normal bowel sounds; no organomegaly or masses detected. EXT: without deformities, mod arthritic changes; no varicose veins/ venous insuffic/ or edema. NEURO:   gait normal & balance OK. DERM:  No lesions noted; no rash etc...    Assessment:

## 2012-03-15 NOTE — Patient Instructions (Addendum)
Augmentin 875mg  Twice daily  For 7days  Mucinex Twice daily  As needed  Cough/congestion  Delsym 2 tsp Twice daily  As needed  Cough  Hydromet 1-2 tsp every 4-6 hr As needed  Cough- may make you sleepy Stop Benazepril .  Begin Benicar 40mg  daily  Please contact office for sooner follow up if symptoms do not improve or worsen or seek emergency care  follow up Dr. Kriste Basque  In 6 week  Or [place]

## 2012-03-15 NOTE — Assessment & Plan Note (Signed)
D/c ACE d/t cough  Begin Benicar 40mg  daily -samples given  follow up 6 weeks and As needed

## 2012-03-15 NOTE — Assessment & Plan Note (Signed)
Slow to resolve Asthmatic bronchitis  ? ACE related cough  Check cxr today  Pt has multiple drug intolerances- says she can take PCN rx - despite cephlasporin rxn and can take hydromet despite hydromophone allergy.   Plan  Augmentin 875mg  Twice daily  For 7days  Mucinex Twice daily  As needed  Cough/congestion  Delsym 2 tsp Twice daily  As needed  Cough  Hydromet 1-2 tsp every 4-6 hr As needed  Cough- may make you sleepy Stop Benazepril .  Begin Benicar 40mg  daily  Please contact office for sooner follow up if symptoms do not improve or worsen or seek emergency care  follow up Dr. Kriste Basque  In 6 week  Or [place]

## 2012-03-16 MED ORDER — OMEPRAZOLE 20 MG PO CPDR: 20.0000 mg | DELAYED_RELEASE_CAPSULE | Freq: Every day | ORAL | Status: DC

## 2012-03-16 MED ORDER — AMLODIPINE BESYLATE 10 MG PO TABS: 10.0000 mg | ORAL_TABLET | Freq: Every day | ORAL | Status: DC

## 2012-03-16 MED ORDER — HYDROCHLOROTHIAZIDE 25 MG PO TABS: 25.0000 mg | ORAL_TABLET | Freq: Every day | ORAL | Status: DC | PRN

## 2012-03-16 MED ORDER — ACYCLOVIR 200 MG PO CAPS: ORAL_CAPSULE | ORAL | Status: DC

## 2012-03-16 NOTE — Addendum Note (Signed)
Addended by: Boone Master E on: 03/16/2012 01:11 PM   Modules accepted: Orders

## 2012-03-16 NOTE — Progress Notes (Signed)
Quick Note:  Called spoke with patient, advised of cxr results / recs as stated by TP. Pt verbalized her understanding and denied any questions. ______ 

## 2012-03-25 ENCOUNTER — Telehealth: Payer: Self-pay | Admitting: Endocrinology

## 2012-03-25 NOTE — Telephone Encounter (Signed)
Wants to see if we have [employer] pens samples. Please call patient # [phone] / [name]

## 2012-03-25 NOTE — Telephone Encounter (Signed)
Pt advised no samples.

## 2012-04-04 ENCOUNTER — Ambulatory Visit: Payer: Medicare Other | Admitting: Endocrinology

## 2012-04-06 ENCOUNTER — Ambulatory Visit (INDEPENDENT_AMBULATORY_CARE_PROVIDER_SITE_OTHER): Payer: Medicare Other | Admitting: Endocrinology

## 2012-04-06 VITALS — BP 130/70 | HR 110 | Wt 181.0 lb

## 2012-04-06 DIAGNOSIS — E119 Type 2 diabetes mellitus without complications: Secondary | ICD-10-CM

## 2012-04-06 LAB — MICROALBUMIN / CREATININE URINE RATIO
Creatinine,U: 255.9 mg/dL
Microalb Creat Ratio: 16.5 mg/g (ref 0.0–30.0)

## 2012-04-06 NOTE — Progress Notes (Signed)
Subjective:    Patient ID: [name], female    DOB: November 20, 1940, 72 y.o.   MRN: [id number]  HPI Pt returns for f/u of type 2 DM (dx'ed 2007; no known complications; she has never had severe hypoglycemia or DKA).  she has finished XRT, and chemotx.  no cbg record, but states cbg's vary from 150-430.  There is no trend throughout the day. She is recovering from a gi virus.  Prior to the illness, she had 1 episode of mild hypoglycemia in the afternoon.  cbg's are highest in the am, even if she does not eat at hs.   Past Medical History  Diagnosis Date  . DIABETES MELLITUS 08/10/2007  . 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 HYPERCHOLESTEROLEMIA 12/17/2006  . OBESITY 06/04/2008  . ANXIETY 06/04/2008  . DEPRESSION 08/10/2007  . HYPERTENSION 12/17/2006  . ASTHMATIC BRONCHITIS, ACUTE 06/04/2008  . ALLERGIC RHINITIS 12/13/2007  . GERD 12/17/2006  . DEGENERATIVE JOINT DISEASE 12/17/2006  . Asthma   . Blood transfusion 1964    post childbirth  . 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 History of chemotherapy   . History of radiation therapy 10/07/11-11/24/11    left breast,5040 cGy/28 sessions,l chest wall boost=1000cGy/5 sesions    Past Surgical History  Procedure Laterality Date  . Laminectomy  1993    s/p-Dr. Jule Ser  . Bladder tack  1974  . Cholecystectomy  02/23/1991    [facility] - Dr Jamey Ripa  . Tonsillectomy    . Rotator cuff repair  8/03    right, Dr. Shelle Iron  . Enterocele repair  06/08    Dr. Estanislado Pandy  . Rotator cuff repair  10/26/2008    left, Dr. Rayburn Ma  . Hip surgery  08/27/2010    DR. Despina Hick  . Breast excisional biopsy  11/10/1988    left benign Dr Jamey Ripa  . Breast excisional biopsy  03/04/1983    Right - two areas - Dr Jamey Ripa  . Breast excisional biopsy  10/09/1980    right - Dr Jamey Ripa  . Breast excisional biopsy  10/18/1979    left - Dr Jamey Ripa  . Breast excisional biopsy  01/02/1994    right - Dr Jamey Ripa  . Vaginal hysterectomy  1974    partial    . Colonoscopy    . Modified radical mastectomy w/ axillary lymph node dissection      03-30-11 LT  . Portacath placement  04/13/2011    Procedure: INSERTION PORT-A-CATH;  Surgeon: Currie Paris, MD;  Location: [facility];  Service: General;  Laterality: N/A;  porta cath placement,removal of J-P drain  . Mastectomy  03/23/2011    left  . Drain seroma  05/01/11    Chronic seroma @mastectomy  site  . Evacuation breast hematoma  07/01/2011    Procedure: EVACUATION HEMATOMA BREAST;  Surgeon: Currie Paris, MD;  Location: [facility];  Service: General;  Laterality: Left;  Drainage of left mastectomy seroma  . Breast surgery    . Port-a-cath removal  12/02/2011    Procedure: REMOVAL PORT-A-CATH;  Surgeon: Currie Paris, MD;  Location:  SUR[facility]ERY C[facility];  Service: General;  Laterality: N/A;    History   Social History  . Marital Status: Married    Spouse Name: N/A    Number of Children: 3  . Years of Education: N/A   Occupational History  .     Social History Main Topics  .  Smoking status: Never Smoker   . Smokeless tobacco: Never Used  . Alcohol Use: No  . Drug Use: No  . Sexually Active: Not Currently   Other Topics Concern  . 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 acyclovir (ZOVIRAX) 200 MG capsule TAKE TWO CAPSULES BY MOUTH THREE TIMES DAILY  120 capsule  0  . ALPRAZolam (XANAX) 0.5 MG tablet TAKE ONE-HALF TO ONE TABLET BY MOUTH THREE TIMES DAILY AS NEEDED FOR  NERVES  90 tablet  1  . amLODipine (NORVASC) 10 MG tablet Take 1 tablet (10 mg total) by mouth daily.  30 tablet  11  . atorvastatin (LIPITOR) 40 MG tablet Take 1 tablet (40 mg total) by mouth daily.  30 tablet  11  . benazepril  (LOTENSIN) 40 MG tablet Take 1 tablet (40 mg total) by mouth daily.  30 tablet  11  . hydrochlorothiazide (HYDRODIURIL) 25 MG tablet Take 1 tablet (25 mg total) by mouth daily as needed.  30 tablet  5  . HYDROcodone-homatropine (HYDROMET) 5-1.5 MG/5ML syrup Take 5 mLs by mouth every 6 (six) hours as needed for cough.  240 mL  0  . Insulin Glargine (LANTUS SOLOSTAR [place]) Inject 20 Units into the skin daily.       . Insulin Lispro, Human, (HUMALOG KWIKPEN [place]) 3 times a day (just before each meal) 20-5-20 units      . letrozole (FEMARA) 2.5 MG tablet Take 2.5 mg by mouth daily.       Marland Kitchen omeprazole (PRILOSEC) 20 MG capsule Take 1 capsule (20 mg total) by mouth daily.  30 capsule  11  . sertraline (ZOLOFT) 50 MG tablet Take 1 tablet (50 mg total) by mouth daily.  30 tablet  5  . [DISCONTINUED] prochlorperazine (COMPAZINE) 10 MG tablet Take 1 tablet (10 mg total) by mouth every 6 (six) hours as needed (Nausea or vomiting).  30 tablet  1   No current facility-administered medications on file prior to visit.    Allergies  Allergen Reactions  . Cephalexin     REACTION: hives  . Clindamycin     REACTION: hives  . Dilaudid (Hydromorphone Hcl) Itching  . Oxycodone-Acetaminophen     REACTION: hives  . Rocephin (Ceftriaxone Sodium In Dextrose) Hives  . Sulfonamide Derivatives     REACTION: hives    Family History  Problem Relation Age of Onset  . COPD Mother   . Arthritis Mother   . Emphysema Mother   . Mental illness Paternal Grandfather   . Heart disease Other     Sibling  . Diabetes Other     Sibling   . Diabetes Brother   . Cancer Brother     prostate  . Cancer Father     malignant brain tumor    BP 130/70  Pulse 110  Wt 181 lb (82.101 kg)  BMI 27.53 kg/m2  SpO2 95%   Review of Systems Denies LOC    Objective:   Physical Exam VITAL SIGNS:  See vs page GENERAL: no distress Pulses: dorsalis pedis intact bilat.   Feet: no deformity.  no ulcer on the feet.  feet are of  normal color and temp.  no edema Neuro: sensation is intact to touch on the feet   Lab Results  Component Value Date   HGBA1C 9.0* 04/06/2012      Assessment & Plan:  DM: needs increased rx

## 2012-04-06 NOTE — Patient Instructions (Addendum)
check your blood sugar 2 times a day.  vary the time of day when you check, between before the 3 meals, after meals, and at bedtime.  also check if you have symptoms of your blood sugar being too high or too low.  please keep a record of the readings and bring it to your next appointment here.  please call us sooner if your blood sugar goes below 70, or if it stays over 200.   Please increase lantus to 20 units per day.   reduce humalog to 3 times a day (just before each meal) 20-5-20 units.   Please come back for a follow-up appointment in 3 months.

## 2012-04-19 ENCOUNTER — Other Ambulatory Visit: Payer: Self-pay | Admitting: Oncology

## 2012-04-26 ENCOUNTER — Ambulatory Visit (INDEPENDENT_AMBULATORY_CARE_PROVIDER_SITE_OTHER): Payer: Medicare Other | Admitting: Adult Health

## 2012-04-26 ENCOUNTER — Encounter: Payer: Self-pay | Admitting: Adult Health

## 2012-04-26 VITALS — BP 116/74 | HR 78 | Temp 98.6°F | Ht 68.0 in | Wt 189.0 lb

## 2012-04-26 DIAGNOSIS — J209 Acute bronchitis, unspecified: Secondary | ICD-10-CM

## 2012-04-26 DIAGNOSIS — I10 Essential (primary) hypertension: Secondary | ICD-10-CM

## 2012-04-26 MED ORDER — ACYCLOVIR 200 MG PO CAPS: ORAL_CAPSULE | ORAL | Status: DC

## 2012-04-26 MED ORDER — ATORVASTATIN CALCIUM 40 MG PO TABS: 40.0000 mg | ORAL_TABLET | Freq: Every day | ORAL | Status: DC

## 2012-04-26 MED ORDER — SERTRALINE HCL 50 MG PO TABS: 50.0000 mg | ORAL_TABLET | Freq: Every day | ORAL | Status: DC

## 2012-04-26 NOTE — Patient Instructions (Addendum)
Begin Losartan 100mg  daily (generic for Benicar 40mg  )  Low salt diet  Low fat cholesterol diet .  Follow up in 3-4 months for fasting labs

## 2012-04-28 ENCOUNTER — Encounter (INDEPENDENT_AMBULATORY_CARE_PROVIDER_SITE_OTHER): Payer: Self-pay | Admitting: Surgery

## 2012-04-29 ENCOUNTER — Telehealth: Payer: Self-pay | Admitting: Pulmonary Disease

## 2012-04-29 MED ORDER — LOSARTAN POTASSIUM 100 MG PO TABS: 100.0000 mg | ORAL_TABLET | Freq: Every day | ORAL | Status: DC

## 2012-04-29 NOTE — Telephone Encounter (Signed)
RX was not sent to the pharmacy for the losartan. Nothing further was needed

## 2012-04-29 NOTE — Assessment & Plan Note (Signed)
Compensated on present regimen. We'll change Benicar. Over to losartan d/t improved insurance coverage.   Plan  Begin Losartan 100mg  daily (generic for Benicar 40mg  )  Low salt diet  Low fat cholesterol diet .  Follow up in 3-4 months for fasting labs

## 2012-04-29 NOTE — Progress Notes (Signed)
Subjective:     Patient ID: [name], female   DOB: 1940/07/06, 72 y.o.   MRN: [id number]  HPI  72y/o WF   she has multiple medical problems including HBP, Chol, DM, Obesity, etc...  ~  March 05, 2010:  27mo ROV >     [name] saw DrEllison for her DM 6/11> she stopped her Glimep on her own due to ?hypoglycemia ("it brings me down too quick") & she wanted to stay on the Metformin & Januvia alone; her BS has been >200 at home and A1c ~8 on checks last yr;  she is not on diet ("I crave sweets") & not exercising;  she is asking about Victoza to help her lose wt & we discussed a comprehensive program of diet (w/ nutritional counselling), exercise, & meds= Metformin500Bid + Victoza 0.6 ==> 1.2mg  daily.    She notes continue Ortho prob w/ bursitis etc; had shot in right hip recently & may need surg per [name] she says.    She notes that she switched Gastroenterologists to "the oriental lady" meaning ?DrNatt-Mann & apparently had EGD & Colonoscopy ?several yrs ago, we don't have these reports, nothing avail in [place], she states "several polyps removed".  ~  January 14, 2011:  40mo ROV & she is c/o recent bronchitis w/ cough, green sputum production, & aching/ sore/ HA/ and sl wheezy & SOB; she had ZPak called in x2 & has been taking Mucinex/ Fluids/ etc; finally went to an [facility] & was given Levaquin & that's when she started to improve she says; she also had the flu vaccine 9/12; she notes that she is finally better & approaching her baseline...  HBP> on Amlodip10, Benazepril40, HCT25 (takes 1/2-1 prn w/ K20/d); BP= 130/72 & she notes that she only take the diuretic in the summer "when I swell"; denies CP, palpit, dizzy, SOB, edema...  CHOL> prev on Cres40 but she stopped on her own "couldn't afford it" & she didn't call for alternative Rx; we decided to check FLP on diet alone & go from there (she will ret for FLP); discussed Lipid Clinic for her...  DM> Followed by DrEllison but last  seen 2/12 on Metform & Victoza; he tried to incr her Metformin to [facility] but she has since decreased the Metform500Bid & stopped the Victoza; she tells me she is taking Januvia50/d "I have samples" she says; she says home accuchecks are "around 150" but no recent labs avail & she will ret for fasting blood work...  OBESITY> weight is 198# which is down 5# over the last year; she is congratulated & encouraged to keep up the great work; we reviewed low carb low fat diet & exercise prescription...  GERD/ Polyps> Followed by Rocco Pauls but we don't have any notes from her; on Prilosec20 & she denies GI symptoms at this time...  Hx Urinary Incont> Followed by [name] GYN & [name] Urology...  DJD/ LBP> she had right hip bursectomy 7/12 by [name] for intractable right hip bursitis; she takes Naprosyn as needed...  Anxiety/ Depression> on Zoloft 50mg /d and Xanax 0,5mg  but states that she rarely takes it...   03/15/12  Acute OV  Complains of prod cough with yellow mucus, increased SOB, occasional tightness in chest x83months - denies wheezing, f/c/s.  has taken 1 round zpak and pred pak at onset Continues to have a recurrent cough over the last 2 months. Patient reports that she initially took a Z-Pak and a prednisone taper without much help. She has had  some production with thick, yellow mucus. At times. She denies any hemoptysis, orthopnea, PND, or leg swelling. She's been using multiple over-the-counter cold and cough medications without much relief. >ACE d/c   04/26/12 Follow up  Patient presents for a six-week followup Patient was seen last time with persistent cough and recurrent bronchitis. She was taken off of her ACE inhibitor and placed on Benicar 40 mg daily. Patient reports that her cough is totally resolved. Patient tolerated. The cough, any difficulty. Patient's insurance requires generic prescriptions. We have, therefore, change her over from Benicar to losartan. Patient denies any  chest pain, orthopnea, PND, or leg swelling.             Problem List:   ALLERGIC RHINITIS (ICD-477.9) - uses OTC meds Prn...  Hx of ASTHMATIC BRONCHITIS, ACUTE (ICD-466.0) - uses NEB w/ Albuterol Prn & she reports breathing better since the last acute exac... 12/12: she reports bronchitic exac that didn't respond to 2 ZPaks but got better after Levaquin given via ER...  HYPERTENSION (ICD-401.9) - currently on AMLODIPINE 10mg /d, BENAZEPRIL 40mg /d (and HCTZ 25mg  + KCl which she takes just as needed for swelling)...  ~  2/12:  BP= 132/66 today and not checking BP at home... denies HA, visual changes, CP, palipit, dizziness, syncope, dyspnea, edema, etc.  ~  Labs 2/12 showed norm Electrolytes, Renal, blood count etc... ~  12/12:  BP= 130/72 & she remains asymptomtic w/o CP, palpit, SOB, edema, etc...  Hx of CHEST PAIN, ATYPICAL (ICD-786.59) - she was seen in the ER by DrPulsipher in 2004 w/ atypical CP... her daughter, Arline Asp, is an Nutritional therapist... ~  NuclearStressTest 9/04 was normal- no scar or ischemia, EF= 73%...  HYPERCHOLESTEROLEMIA (ICD-272.0) - prev on Simva80, but not on much of a diet; Changed to CRESTOR 40mg /d but she stopped this on her own (too $$); asked to take FISH OIL daily. ~  previous labs showed cholesterol as high as 350 in the past... prev on numerous statin meds. ~  FLP 11/08 ?off Lova40 showed TChol 252, TG 236, HDL 49, LDL 163 ~  FLP 10/09 on Simva80 showed TChol 212, TG 124, HDL 64, LDL 119... reminded to take meds regularly. ~  FLP 4/10 on Simva80 showed TChol 206, TG 95, HDL 71, LDL 112... I checked w/ her pharm- filled Simva80 #30 on 2/24 & 4/20. ~  FLP 2/12 on ?Simva80 showed TChol 240, TG 95, HDL 75, LDL 154... rec switch to Cres40. ~  She never ret for FLP on the Crestor & stopped this on her own> encouraged f/u in Lipid Clinic... ~  DrEllison tried Welchol too but she stopped this as well- ?why...  DIABETES MELLITUS (ICD-250.00) - prev on METFORMIN  500mg Bid & Glimepiride 2mg /d (she stopped on her own "it brings me down to quick") so JANUVIA 100mg /d added by DrEllison; pt not on diet, not exercising, & "I crave sweets"; she requested Victoza 2/12 & this was prescribed but she stopped it on her own & didn't f/u; DrEllison tried to incr her Metformin to 1000mg Bid but she decr back to 500Bid on her own; she also started taking Januvia50mg /d "I had samples"... ~  labs 11/08 (wt= 211#) showed BS= 129, HgA1c= 6.6.Marland Kitchen. ~  labs 10/09 (wt= 216#) showed BS= 109, HgA1c= 6.7... ~  labs 4/10 (wt= 211#) showed BS= 127, HgA1c= 7.3.Marland Kitchen. rec> get on diet, incr Metform Bid. ~  labs 8/10 (wt=213#) showed BS= 154, A1c= 7.1 ~  labs 2/12 (wt=203#) showed BS= 167, A1c=  8.0... Januvia ch to VICTOZA 0.6 =>1.2mg  daily; she subseq stopped on her own... ~  12/12:  On Metform500Bid & Januvia 50mg /d she says; wt is down 5# to 198#; she will ret for fasting blood work & f/u w/ DrEllison...  OBESITY (ICD-278.00) - her weight has been betw 200-215 x yrs... she is 5\' 8"  tall for a BMI~ 32-33... she says her weight is much lower on her home scales... discussed diet + exercise program required to lose weight...   GERD (ICD-530.81) - on OMEPRAZOLE 20mg /d... she had EGD 11/04 by DrStark w/ 4cmHH, stricture- dilated, gastritis & polyp... ? of COLON POLYPS ? we don't have records from [facility]... ~  she had a normal colonoscopy 11/04 from DrStark x tortuous colon... Rx w/ Levsin Prn. ~  she reports switching gastroenterologist ?DrNatt-Mann w/ EGD & Colon done ?when- she reports several polyps removed.  Hx of URINARY INCONTINENCE (ICD-788.30) - she had a full eval 1/07 by [name] for Urology, and is followed by her GYN, [name]...  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 uses ACYCLOVIR 400mg  Tid for outbreaks.  DEGENERATIVE JOINT DISEASE (ICD-715.90) - she gets freq shots for bursitis from Ortho. Hx of BACK PAIN, LUMBAR (ICD-724.2) - with  prev lumbar laminectomy by Raphael Gibney...  ANXIETY (ICD-300.00) - on ALPRAZOLAM 0.5mg  Tid Prn "I don't use it often"... DEPRESSION (ICD-311) - on ZOLOFT 50mg /d...   Past Surgical History  Procedure Laterality Date  . Laminectomy  1993    s/p-Dr. Jule Ser  . Bladder tack  1974  . Cholecystectomy  02/23/1991    [facility] - Dr Jamey Ripa  . Tonsillectomy    . Rotator cuff repair  8/03    right, Dr. Shelle Iron  . Enterocele repair  06/08    Dr. Estanislado Pandy  . Rotator cuff repair  10/26/2008    left, Dr. Rayburn Ma  . Hip surgery  08/27/2010    DR. Despina Hick  . Breast excisional biopsy  11/10/1988    left benign Dr Jamey Ripa  . Breast excisional biopsy  03/04/1983    Right - two areas - Dr Jamey Ripa  . Breast excisional biopsy  10/09/1980    right - Dr Jamey Ripa  . Breast excisional biopsy  10/18/1979    left - Dr Jamey Ripa  . Breast excisional biopsy  01/02/1994    right - Dr Jamey Ripa  . Vaginal hysterectomy  1974    partial   . Colonoscopy    . Modified radical mastectomy w/ axillary lymph node dissection      03-30-11 LT  . Portacath placement  04/13/2011    Procedure: INSERTION PORT-A-CATH;  Surgeon: Currie Paris, MD;  Location: [facility];  Service: General;  Laterality: N/A;  porta cath placement,removal of J-P drain  . Mastectomy  03/23/2011    left  . Drain seroma  05/01/11    Chronic seroma @mastectomy  site  . Evacuation breast hematoma  07/01/2011    Procedure: EVACUATION HEMATOMA BREAST;  Surgeon: Currie Paris, MD;  Location: [facility];  Service: General;  Laterality: Left;  Drainage of left mastectomy seroma  . Breast surgery    . Port-a-cath removal  12/02/2011    Procedure: REMOVAL PORT-A-CATH;  Surgeon: Currie Paris, MD;  Location: [facility];  Service: General;  Laterality: N/A;    . Outpatient Encounter Prescriptions as of 01/14/2011  Medication Sig Dispense Refill  . albuterol (PROVENTIL) (2.5 MG/3ML) 0.083% nebulizer solution 2.5 mg every 4  (  four) hours as needed. As needed for shortness of breath.      . ALPRAZolam (XANAX) 0.5 MG tablet 1/2-1 tablet three times a day as needed for nerves  90 tablet  5  . amLODipine (NORVASC) 10 MG tablet Take 1 tablet (10 mg total) by mouth daily.  30 tablet  11  . benazepril (LOTENSIN) 40 MG tablet Take 1 tablet (40 mg total) by mouth daily.  30 tablet  11  . Cholecalciferol (VITAMIN D) 2000 UNITS tablet Take 2,000 Units by mouth daily.        . hydrochlorothiazide 25 MG tablet 1/2 to 1 tablet by mouth once a day as needed for swelling       . naproxen (NAPROSYN) 250 MG tablet Take 250 mg by mouth daily as needed. As needed for bursitis pain in hip       . omeprazole (PRILOSEC) 20 MG capsule Take 1 capsule (20 mg total) by mouth daily.  30 capsule  11  . potassium chloride SA (K-DUR,KLOR-CON) 20 MEQ tablet Take 20 mEq by mouth daily as needed. (take with the HCTZ)       . sertraline (ZOLOFT) 50 MG tablet Take 1 tablet (50 mg total) by mouth daily.  30 tablet  11  . sitaGLIPtin (JANUVIA) 50 MG tablet Take 50 mg by mouth daily.          Allergies  Allergen Reactions  . Cephalexin     REACTION: hives  . Clindamycin     REACTION: hives  . Dilaudid (Hydromorphone Hcl) Itching  . Oxycodone-Acetaminophen     REACTION: hives  . Rocephin (Ceftriaxone Sodium In Dextrose) Hives  . Sulfonamide Derivatives     REACTION: hives    Current Medications, Allergies, Past Medical History, Past Surgical History, Family History, and Social History were reviewed in [employer] record.    Review of Systems Constitutional:   No  weight loss, night sweats,  Fevers, chills, fatigue, or  lassitude.  HEENT:   No headaches,  Difficulty swallowing,  Tooth/dental problems, or  Sore throat,                No sneezing, itching, ear ache, nasal congestion, post nasal drip,   CV:  No chest pain,  Orthopnea, PND, swelling in lower extremities, anasarca, dizziness, palpitations, syncope.   GI   No heartburn, indigestion, abdominal pain, nausea, vomiting, diarrhea, change in bowel habits, loss of appetite, bloody stools.   Resp:   No wheezing.  No chest wall deformity  Skin: no rash or lesions.  GU: no dysuria, change in color of urine, no urgency or frequency.  No flank pain, no hematuria   MS:  No joint pain or swelling.  No decreased range of motion.  No back pain.  Psych:  No change in mood or affect. No depression or anxiety.  No memory loss.            Objective:   Physical Exam       WD, Overweight, 72 y/o WF in NAD... GENERAL:  Alert & oriented; pleasant & cooperative. HEENT:  [place]/AT, EOM-wnl, PERRLA, EACs-clear, TMs-wnl, NOSE-clear, THROAT-clear & wnl. NECK:  Supple w/ fairROM; no JVD; normal carotid impulses w/o bruits; no thyromegaly or nodules palpated; no lymphadenopathy. CHEST:  Clear to P & A; without wheezes/ rales/ or rhonchi. HEART:  Regular Rhythm; without murmurs/ rubs/ or gallops. ABDOMEN:  Obese, soft & nontender; normal bowel sounds; no organomegaly or masses detected. EXT: without deformities,  mod arthritic changes; no varicose veins/ venous insuffic/ or edema. NEURO:   gait normal & balance OK. DERM:  No lesions noted; no rash etc...    Assessment:

## 2012-04-29 NOTE — Assessment & Plan Note (Signed)
Recurrent exacerbation with persistent cough. This is now resolved off ACE inhibitor. Patient continue on her current regimen and followup as planned

## 2012-05-02 ENCOUNTER — Other Ambulatory Visit (INDEPENDENT_AMBULATORY_CARE_PROVIDER_SITE_OTHER): Payer: Self-pay | Admitting: Surgery

## 2012-05-02 DIAGNOSIS — Z853 Personal history of malignant neoplasm of breast: Secondary | ICD-10-CM

## 2012-05-02 DIAGNOSIS — Z9012 Acquired absence of left breast and nipple: Secondary | ICD-10-CM

## 2012-05-02 DIAGNOSIS — Z1231 Encounter for screening mammogram for malignant neoplasm of breast: Secondary | ICD-10-CM

## 2012-05-11 ENCOUNTER — Ambulatory Visit
Admission: RE | Admit: 2012-05-11 | Discharge: 2012-05-11 | Disposition: A | Discharge status: Home / Self Care | Payer: Medicare Other | Source: Ambulatory Visit | Attending: Surgery | Admitting: Surgery

## 2012-05-11 DIAGNOSIS — Z9012 Acquired absence of left breast and nipple: Secondary | ICD-10-CM

## 2012-05-11 DIAGNOSIS — Z1231 Encounter for screening mammogram for malignant neoplasm of breast: Secondary | ICD-10-CM

## 2012-05-11 DIAGNOSIS — Z853 Personal history of malignant neoplasm of breast: Secondary | ICD-10-CM

## 2012-06-06 ENCOUNTER — Other Ambulatory Visit: Payer: Self-pay | Admitting: *Deleted

## 2012-06-06 DIAGNOSIS — C50312 Malignant neoplasm of lower-inner quadrant of left female breast: Secondary | ICD-10-CM

## 2012-06-06 MED ORDER — LETROZOLE 2.5 MG PO TABS: ORAL_TABLET | ORAL | Status: DC

## 2012-06-07 ENCOUNTER — Other Ambulatory Visit: Payer: Self-pay | Admitting: Medical Oncology

## 2012-06-07 DIAGNOSIS — C50312 Malignant neoplasm of lower-inner quadrant of left female breast: Secondary | ICD-10-CM

## 2012-06-07 MED ORDER — LETROZOLE 2.5 MG PO TABS: ORAL_TABLET | ORAL | Status: DC

## 2012-06-08 ENCOUNTER — Inpatient Hospital Stay: Admission: RE | Admit: 2012-06-08 | Payer: Medicare Other | Source: Ambulatory Visit

## 2012-07-05 ENCOUNTER — Encounter: Payer: Self-pay | Admitting: Endocrinology

## 2012-07-05 ENCOUNTER — Ambulatory Visit (INDEPENDENT_AMBULATORY_CARE_PROVIDER_SITE_OTHER): Payer: Medicare Other | Admitting: Endocrinology

## 2012-07-05 VITALS — BP 126/78 | HR 78 | Ht 68.0 in | Wt 188.0 lb

## 2012-07-05 DIAGNOSIS — E119 Type 2 diabetes mellitus without complications: Secondary | ICD-10-CM

## 2012-07-05 MED ORDER — INSULIN GLARGINE 100 UNIT/ML SOLOSTAR PEN: 50.0000 [IU] | PEN_INJECTOR | Freq: Every day | SUBCUTANEOUS | Status: DC

## 2012-07-05 NOTE — Patient Instructions (Addendum)
check your blood sugar 2 times a day.  vary the time of day when you check, between before the 3 meals, after meals, and at bedtime.  also check if you have symptoms of your blood sugar being too high or too low.  please keep a record of the readings and bring it to your next appointment here.  please call us sooner if your blood sugar goes below 70, or if it stays over 200.   Please increase lantus to 50 units at bedtime.    contine humalog, 3 times a day (just before each meal) 20-5-20 units.   Please come back for a follow-up appointment in 3 months.

## 2012-07-05 NOTE — Progress Notes (Signed)
Subjective:    Patient ID: [name], female    DOB: 21-Jul-1940, 72 y.o.   MRN: [id number]  HPI Pt returns for f/u of type 2 DM (dx'ed 2007; she has mild if any neuropathy of the lower extremities; no known associated complications; she has never had severe hypoglycemia or DKA).  she has finished XRT, and chemotx.  no cbg record, but states cbg's vary from 150-200's.  cbg's are highest in the am, even if she does not eat at hs. It is lowest in the afternoon.  pt states she feels well in general. Past Medical History  Diagnosis Date  . DIABETES MELLITUS 08/10/2007  . 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 HYPERCHOLESTEROLEMIA 12/17/2006  . OBESITY 06/04/2008  . ANXIETY 06/04/2008  . DEPRESSION 08/10/2007  . HYPERTENSION 12/17/2006  . ASTHMATIC BRONCHITIS, ACUTE 06/04/2008  . ALLERGIC RHINITIS 12/13/2007  . GERD 12/17/2006  . DEGENERATIVE JOINT DISEASE 12/17/2006  . Asthma   . Blood transfusion 1964    post childbirth  . 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 History of chemotherapy   . History of radiation therapy 10/07/11-11/24/11    left breast,5040 cGy/28 sessions,l chest wall boost=1000cGy/5 sesions    Past Surgical History  Procedure Laterality Date  . Laminectomy  1993    s/p-Dr. Jule Ser  . Bladder tack  1974  . Cholecystectomy  02/23/1991    [facility] - Dr Jamey Ripa  . Tonsillectomy    . Rotator cuff repair  8/03    right, Dr. Shelle Iron  . Enterocele repair  06/08    Dr. Estanislado Pandy  . Rotator cuff repair  10/26/2008    left, Dr. Rayburn Ma  . Hip surgery  08/27/2010    DR. Despina Hick  . Breast excisional biopsy  11/10/1988    left benign Dr Jamey Ripa  . Breast excisional biopsy  03/04/1983    Right - two areas - Dr Jamey Ripa  . Breast excisional biopsy  10/09/1980    right - Dr Jamey Ripa  . Breast excisional biopsy  10/18/1979    left - Dr Jamey Ripa  . Breast excisional biopsy  01/02/1994    right - Dr Jamey Ripa  . Vaginal hysterectomy  1974    partial   . Colonoscopy     . Modified radical mastectomy w/ axillary lymph node dissection      03-30-11 LT  . Portacath placement  04/13/2011    Procedure: INSERTION PORT-A-CATH;  Surgeon: Currie Paris, MD;  Location: [facility];  Service: General;  Laterality: N/A;  porta cath placement,removal of J-P drain  . Mastectomy  03/23/2011    left  . Drain seroma  05/01/11    Chronic seroma @mastectomy  site  . Evacuation breast hematoma  07/01/2011    Procedure: EVACUATION HEMATOMA BREAST;  Surgeon: Currie Paris, MD;  Location: [facility];  Service: General;  Laterality: Left;  Drainage of left mastectomy seroma  . Breast surgery    . Port-a-cath removal  12/02/2011    Procedure: REMOVAL PORT-A-CATH;  Surgeon: Currie Paris, MD;  Location: [facility];  Service: General;  Laterality: N/A;    History   Social History  . Marital Status: Married    Spouse Name: N/A    Number of Children: 3  . Years of Education: N/A   Occupational History  .     Social History Main Topics  . Smoking status: Never Smoker   .  Smokeless tobacco: Never Used  . Alcohol Use: No  . Drug Use: No  . Sexually Active: Not Currently   Other Topics Concern  . 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 acyclovir (ZOVIRAX) 200 MG capsule TAKE TWO CAPSULES BY MOUTH THREE TIMES DAILY  120 capsule  5  . Alcohol Swabs PADS Use once daily to check blood glucose as directed      . ALPRAZolam (XANAX) 0.5 MG tablet TAKE ONE-HALF TO ONE TABLET BY MOUTH THREE TIMES DAILY AS NEEDED FOR  NERVES  90 tablet  1  . amLODipine (NORVASC) 10 MG tablet Take 1 tablet (10 mg total) by mouth daily.  30 tablet  11  . atorvastatin (LIPITOR) 40 MG tablet Take 1 tablet  (40 mg total) by mouth daily.  30 tablet  5  . benazepril (LOTENSIN) 40 MG tablet Take 1 tablet (40 mg total) by mouth daily.  30 tablet  11  . Blood Glucose Monitoring Suppl (EASY TALK BLOOD GLUCOSE SYSTEM) DEVI Use once daily as directed to check blood glucose      . EASY TALK BLOOD GLUCOSE TEST test strip Use to check blood glucose once daily as directed      . hydrochlorothiazide (HYDRODIURIL) 25 MG tablet Take 1 tablet (25 mg total) by mouth daily as needed.  30 tablet  5  . HYDROcodone-homatropine (HYDROMET) 5-1.5 MG/5ML syrup Take 5 mLs by mouth every 6 (six) hours as needed for cough.  240 mL  0  . Insulin Lispro, Human, (HUMALOG KWIKPEN [place]) 3 times a day (just before each meal) 20-5-20 units      . letrozole (FEMARA) 2.5 MG tablet TAKE ONE TABLET BY MOUTH DAILY.  30 tablet  12  . losartan (COZAAR) 100 MG tablet Take 1 tablet (100 mg total) by mouth daily.  30 tablet  5  . omeprazole (PRILOSEC) 20 MG capsule Take 1 capsule (20 mg total) by mouth daily.  30 capsule  11  . sertraline (ZOLOFT) 50 MG tablet Take 1 tablet (50 mg total) by mouth daily.  30 tablet  5  . [DISCONTINUED] prochlorperazine (COMPAZINE) 10 MG tablet Take 1 tablet (10 mg total) by mouth every 6 (six) hours as needed (Nausea or vomiting).  30 tablet  1   No current facility-administered medications on file prior to visit.    Allergies  Allergen Reactions  . Cephalexin     REACTION: hives  . Clindamycin     REACTION: hives  . Dilaudid (Hydromorphone Hcl) Itching  . Oxycodone-Acetaminophen     REACTION: hives  . Rocephin (Ceftriaxone Sodium In Dextrose) Hives  . Sulfonamide Derivatives     REACTION: hives    Family History  Problem Relation Age of Onset  . COPD Mother   . Arthritis Mother   . Emphysema Mother   . Mental illness Paternal Grandfather   . Heart disease Other     Sibling  . Diabetes Other     Sibling   . Diabetes Brother   . Cancer Brother     prostate  . Cancer Father     malignant  brain tumor    BP  126/78  Pulse 78  Ht 5\' 8"  (1.727 m)  Wt 188 lb (85.276 kg)  BMI 28.59 kg/m2  SpO2 98%  Review of Systems denies hypoglycemia and weight change    Objective:   Physical Exam VITAL SIGNS:  See vs page GENERAL: no distress.     Lab Results  Component Value Date   HGBA1C 8.9* 07/05/2012      Assessment & Plan:  DM: she needs increased rx

## 2012-07-06 NOTE — Progress Notes (Signed)
  Subjective:    Patient ID: [name], female    DOB: Oct 09, 1940, 72 y.o.   MRN: [id number]  HPI    Review of Systems     Objective:   Physical Exam        Assessment & Plan:  This insulin regimen was chosen from multiple options, as it best matches his insulin to his changing requirements throughout the day.  The benefits of glycemic control must be weighed against the risks of hypoglycemia.

## 2012-07-20 ENCOUNTER — Telehealth (INDEPENDENT_AMBULATORY_CARE_PROVIDER_SITE_OTHER): Payer: Self-pay

## 2012-07-20 NOTE — Telephone Encounter (Signed)
Order completed. In Dr Tenna Child box for signature. Meagen- please have Dr Jamey Ripa sign and fax to [employer].

## 2012-07-20 NOTE — Telephone Encounter (Signed)
Romana with [facility] [employer] [facility] [facility] needs order for Bra prostheses  Please order # [id number]; [id number] Fax to [phone]

## 2012-07-27 ENCOUNTER — Encounter: Payer: Self-pay | Admitting: Adult Health

## 2012-07-27 ENCOUNTER — Ambulatory Visit (INDEPENDENT_AMBULATORY_CARE_PROVIDER_SITE_OTHER): Payer: Medicare Other | Admitting: Adult Health

## 2012-07-27 VITALS — BP 126/72 | HR 79 | Temp 98.1°F | Ht 68.0 in | Wt 193.4 lb

## 2012-07-27 DIAGNOSIS — E7800 Pure hypercholesterolemia, unspecified: Secondary | ICD-10-CM

## 2012-07-27 DIAGNOSIS — I10 Essential (primary) hypertension: Secondary | ICD-10-CM

## 2012-07-27 DIAGNOSIS — E119 Type 2 diabetes mellitus without complications: Secondary | ICD-10-CM

## 2012-07-27 DIAGNOSIS — Z23 Encounter for immunization: Secondary | ICD-10-CM

## 2012-07-27 NOTE — Patient Instructions (Addendum)
Return for fasting labs with lipid panel , tsh and liver function test.  Pneumovax today.  Low salt diet , exercise tolerated, low fat/cholesterol diet.  Follow up in 6 months and As needed

## 2012-07-27 NOTE — Progress Notes (Addendum)
Subjective:     Patient ID: [name], female   DOB: May 26, 1940, 72 y.o.   MRN: [id number]  HPI  72y/o WF   she has multiple medical problems including HBP, Chol, DM, Obesity, etc...  ~  March 05, 2010:  15mo ROV >     [name] saw [name] for her DM 6/11> she stopped her Glimep on her own due to ?hypoglycemia ("it brings me down too quick") & she wanted to stay on the Metformin & Januvia alone; her BS has been >200 at home and A1c ~8 on checks last yr;  she is not on diet ("I crave sweets") & not exercising;  she is asking about Victoza to help her lose wt & we discussed a comprehensive program of diet (w/ nutritional counselling), exercise, & meds= Metformin500Bid + Victoza 0.6 ==> 1.2mg  daily.    She notes continue Ortho prob w/ bursitis etc; had shot in right hip recently & may need surg per [name] she says.    She notes that she switched Gastroenterologists to "the oriental lady" meaning ?[name] & apparently had EGD & Colonoscopy ?several yrs ago, we don't have these reports, nothing avail in [place], she states "several polyps removed".  ~  January 14, 2011:  12mo ROV & she is c/o recent bronchitis w/ cough, green sputum production, & aching/ sore/ HA/ and sl wheezy & SOB; she had ZPak called in x2 & has been taking Mucinex/ Fluids/ etc; finally went to an [facility] & was given Levaquin & that's when she started to improve she says; she also had the flu vaccine 9/12; she notes that she is finally better & approaching her baseline...  HBP> on Amlodip10, Benazepril40, HCT25 (takes 1/2-1 prn w/ K20/d); BP= 130/72 & she notes that she only take the diuretic in the summer "when I swell"; denies CP, palpit, dizzy, SOB, edema...  CHOL> prev on Cres40 but she stopped on her own "couldn't afford it" & she didn't call for alternative Rx; we decided to check FLP on diet alone & go from there (she will ret for FLP); discussed Lipid Clinic for her...  DM> Followed by [name] but last  seen 2/12 on Metform & Victoza; he tried to incr her Metformin to [facility] but she has since decreased the Metform500Bid & stopped the Victoza; she tells me she is taking Januvia50/d "I have samples" she says; she says home accuchecks are "around 150" but no recent labs avail & she will ret for fasting blood work...  OBESITY> weight is 198# which is down 5# over the last year; she is congratulated & encouraged to keep up the great work; we reviewed low carb low fat diet & exercise prescription...  GERD/ Polyps> Followed by [name] but we don't have any notes from her; on Prilosec20 & she denies GI symptoms at this time...  Hx Urinary Incont> Followed by [name] GYN & [name] Urology...  DJD/ LBP> she had right hip bursectomy 7/12 by [name] for intractable right hip bursitis; she takes Naprosyn as needed...  Anxiety/ Depression> on Zoloft 50mg /d and Xanax 0,5mg  but states that she rarely takes it...   03/15/12  Acute OV  Complains of prod cough with yellow mucus, increased SOB, occasional tightness in chest x40months - denies wheezing, f/c/s.  has taken 1 round zpak and pred pak at onset Continues to have a recurrent cough over the last 2 months. Patient reports that she initially took a Z-Pak and a prednisone taper without much help. She has had  some production with thick, yellow mucus. At times. She denies any hemoptysis, orthopnea, PND, or leg swelling. She's been using multiple over-the-counter cold and cough medications without much relief. >ACE d/c   04/26/12 Follow up  Patient presents for a six-week followup Patient was seen last time with persistent cough and recurrent bronchitis. She was taken off of her ACE inhibitor and placed on Benicar 40 mg daily. Patient reports that her cough is totally resolved. Patient tolerated. The cough, any difficulty. Patient's insurance requires generic prescriptions. We have, therefore, change her over from Benicar to losartan. Patient denies any  chest pain, orthopnea, PND, or leg swelling.   07/27/2012 Follow up  4 month follow-up. Pt denies any complaints at this time.  Doing well with no chest pain, dyspnea, edema, n/v.  Due for pneumovax.  Seen by endocrinology recently , insulin adjusted. No polyuria/polydipsia.  Discussed diet and exercise.  Not fasting today , will return for fasting lipid panel.  Has grandson with her today.            Problem List:   ALLERGIC RHINITIS (ICD-477.9) - uses OTC meds Prn...  Hx of ASTHMATIC BRONCHITIS, ACUTE (ICD-466.0) - uses NEB w/ Albuterol Prn & she reports breathing better since the last acute exac... 12/12: she reports bronchitic exac that didn't respond to 2 ZPaks but got better after Levaquin given via ER...  HYPERTENSION (ICD-401.9) - currently on AMLODIPINE 10mg /d, BENAZEPRIL 40mg /d (and HCTZ 25mg  + KCl which she takes just as needed for swelling)...  ~  2/12:  BP= 132/66 today and not checking BP at home... denies HA, visual changes, CP, palipit, dizziness, syncope, dyspnea, edema, etc.  ~  Labs 2/12 showed norm Electrolytes, Renal, blood count etc... ~  12/12:  BP= 130/72 & she remains asymptomtic w/o CP, palpit, SOB, edema, etc...  Hx of CHEST PAIN, ATYPICAL (ICD-786.59) - she was seen in the ER by [name] in 2004 w/ atypical CP... her daughter, [name], is an Nutritional therapist... ~  NuclearStressTest 9/04 was normal- no scar or ischemia, EF= 73%...  HYPERCHOLESTEROLEMIA (ICD-272.0) - prev on Simva80, but not on much of a diet; Changed to CRESTOR 40mg /d but she stopped this on her own (too $$); asked to take FISH OIL daily. ~  previous labs showed cholesterol as high as 350 in the past... prev on numerous statin meds. ~  FLP 11/08 ?off Lova40 showed TChol 252, TG 236, HDL 49, LDL 163 ~  FLP 10/09 on Simva80 showed TChol 212, TG 124, HDL 64, LDL 119... reminded to take meds regularly. ~  FLP 4/10 on Simva80 showed TChol 206, TG 95, HDL 71, LDL 112... I checked w/ her pharm-  filled Simva80 #30 on 2/24 & 4/20. ~  FLP 2/12 on ?Simva80 showed TChol 240, TG 95, HDL 75, LDL 154... rec switch to Cres40. ~  She never ret for FLP on the Crestor & stopped this on her own> encouraged f/u in Lipid Clinic... ~  [name] tried Welchol too but she stopped this as well- ?why...  DIABETES MELLITUS (ICD-250.00) - prev on METFORMIN 500mg Bid & Glimepiride 2mg /d (she stopped on her own "it brings me down to quick") so JANUVIA 100mg /d added by [name]; pt not on diet, not exercising, & "I crave sweets"; she requested Victoza 2/12 & this was prescribed but she stopped it on her own & didn't f/u; [name] tried to incr her Metformin to 1000mg Bid but she decr back to 500Bid on her own; she also started taking Januvia50mg /d "I  had samples"... ~  labs 11/08 (wt= 211#) showed BS= 129, HgA1c= 6.6.Marland Kitchen. ~  labs 10/09 (wt= 216#) showed BS= 109, HgA1c= 6.7... ~  labs 4/10 (wt= 211#) showed BS= 127, HgA1c= 7.3.Marland Kitchen. rec> get on diet, incr Metform Bid. ~  labs 8/10 (wt=213#) showed BS= 154, A1c= 7.1 ~  labs 2/12 (wt=203#) showed BS= 167, A1c= 8.0... Januvia ch to VICTOZA 0.6 =>1.2mg  daily; she subseq stopped on her own... ~  12/12:  On Metform500Bid & Januvia 50mg /d she says; wt is down 5# to 198#; she will ret for fasting blood work & f/u w/ [name]...  OBESITY (ICD-278.00) - her weight has been betw 200-215 x yrs... she is 5\' 8"  tall for a BMI~ 32-33... she says her weight is much lower on her home scales... discussed diet + exercise program required to lose weight...   GERD (ICD-530.81) - on OMEPRAZOLE 20mg /d... she had EGD 11/04 by DrStark w/ 4cmHH, stricture- dilated, gastritis & polyp... ? of COLON POLYPS ? we don't have records from [facility]... ~  she had a normal colonoscopy 11/04 from DrStark x tortuous colon... Rx w/ Levsin Prn. ~  she reports switching gastroenterologist ?[name] w/ EGD & Colon done ?when- she reports several polyps removed.  Hx of URINARY INCONTINENCE (ICD-788.30)  - she had a full eval 1/07 by [name] for Urology, and is followed by her GYN, [name]...  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 uses ACYCLOVIR 400mg  Tid for outbreaks.  DEGENERATIVE JOINT DISEASE (ICD-715.90) - she gets freq shots for bursitis from Ortho. Hx of BACK PAIN, LUMBAR (ICD-724.2) - with prev lumbar laminectomy by Raphael Gibney...  ANXIETY (ICD-300.00) - on ALPRAZOLAM 0.5mg  Tid Prn "I don't use it often"... DEPRESSION (ICD-311) - on ZOLOFT 50mg /d...   Past Surgical History  Procedure Laterality Date  . Laminectomy  1993    s/p-Dr. Jule Ser  . Bladder tack  1974  . Cholecystectomy  02/23/1991    [facility] - Dr Jamey Ripa  . Tonsillectomy    . Rotator cuff repair  8/03    right, Dr. Shelle Iron  . Enterocele repair  06/08    Dr. Estanislado Pandy  . Rotator cuff repair  10/26/2008    left, Dr. Rayburn Ma  . Hip surgery  08/27/2010    DR. Despina Hick  . Breast excisional biopsy  11/10/1988    left benign Dr Jamey Ripa  . Breast excisional biopsy  03/04/1983    Right - two areas - Dr Jamey Ripa  . Breast excisional biopsy  10/09/1980    right - Dr Jamey Ripa  . Breast excisional biopsy  10/18/1979    left - Dr Jamey Ripa  . Breast excisional biopsy  01/02/1994    right - Dr Jamey Ripa  . Vaginal hysterectomy  1974    partial   . Colonoscopy    . Modified radical mastectomy w/ axillary lymph node dissection      03-30-11 LT  . Portacath placement  04/13/2011    Procedure: INSERTION PORT-A-CATH;  Surgeon: Currie Paris, MD;  Location: [facility];  Service: General;  Laterality: N/A;  porta cath placement,removal of J-P drain  . Mastectomy  03/23/2011    left  . Drain seroma  05/01/11    Chronic seroma @mastectomy  site  . Evacuation breast hematoma  07/01/2011    Procedure: EVACUATION HEMATOMA BREAST;  Surgeon: Currie Paris, MD;  Location: [facility];  Service: General;  Laterality: Left;  Drainage of left mastectomy seroma  . Breast  surgery    .  Port-a-cath removal  12/02/2011    Procedure: REMOVAL PORT-A-CATH;  Surgeon: Currie Paris, MD;  Location: [facility];  Service: General;  Laterality: N/A;    . Outpatient Encounter Prescriptions as of 01/14/2011  Medication Sig Dispense Refill  . albuterol (PROVENTIL) (2.5 MG/3ML) 0.083% nebulizer solution 2.5 mg every 4 (four) hours as needed. As needed for shortness of breath.      . ALPRAZolam (XANAX) 0.5 MG tablet 1/2-1 tablet three times a day as needed for nerves  90 tablet  5  . amLODipine (NORVASC) 10 MG tablet Take 1 tablet (10 mg total) by mouth daily.  30 tablet  11  . benazepril (LOTENSIN) 40 MG tablet Take 1 tablet (40 mg total) by mouth daily.  30 tablet  11  . Cholecalciferol (VITAMIN D) 2000 UNITS tablet Take 2,000 Units by mouth daily.        . hydrochlorothiazide 25 MG tablet 1/2 to 1 tablet by mouth once a day as needed for swelling       . naproxen (NAPROSYN) 250 MG tablet Take 250 mg by mouth daily as needed. As needed for bursitis pain in hip       . omeprazole (PRILOSEC) 20 MG capsule Take 1 capsule (20 mg total) by mouth daily.  30 capsule  11  . potassium chloride SA (K-DUR,KLOR-CON) 20 MEQ tablet Take 20 mEq by mouth daily as needed. (take with the HCTZ)       . sertraline (ZOLOFT) 50 MG tablet Take 1 tablet (50 mg total) by mouth daily.  30 tablet  11  . sitaGLIPtin (JANUVIA) 50 MG tablet Take 50 mg by mouth daily.          Allergies  Allergen Reactions  . Cephalexin     REACTION: hives  . Clindamycin     REACTION: hives  . Dilaudid (Hydromorphone Hcl) Itching  . Oxycodone-Acetaminophen     REACTION: hives  . Rocephin (Ceftriaxone Sodium In Dextrose) Hives  . Sulfonamide Derivatives     REACTION: hives    Current Medications, Allergies, Past Medical History, Past Surgical History, Family History, and Social History were reviewed in [employer] record.    Review of Systems Constitutional:   No  weight loss,  night sweats,  Fevers, chills,  +fatigue, or  lassitude.  HEENT:   No headaches,  Difficulty swallowing,  Tooth/dental problems, or  Sore throat,                No sneezing, itching, ear ache, nasal congestion, post nasal drip,   CV:  No chest pain,  Orthopnea, PND, swelling in lower extremities, anasarca, dizziness, palpitations, syncope.   GI  No heartburn, indigestion, abdominal pain, nausea, vomiting, diarrhea, change in bowel habits, loss of appetite, bloody stools.   Resp:   No wheezing.  No chest wall deformity  Skin: no rash or lesions.  GU: no dysuria, change in color of urine, no urgency or frequency.  No flank pain, no hematuria   MS:     No back pain.  Psych:  No change in mood or affect. No depression or anxiety.  No memory loss.            Objective:   Physical Exam       WD, Overweight, 72 y/o WF in NAD... GENERAL:  Alert & oriented; pleasant & cooperative. HEENT:  [place]/AT, EOM-wnl, PERRLA, EACs-clear, TMs-wnl, NOSE-clear, THROAT-clear & wnl. NECK:  Supple  w/ fairROM; no JVD; normal carotid impulses w/o bruits; no thyromegaly or nodules palpated; no lymphadenopathy. CHEST:  Clear to P & A; without wheezes/ rales/ or rhonchi. HEART:  Regular Rhythm; without murmurs/ rubs/ or gallops. ABDOMEN:  Obese, soft & nontender; normal bowel sounds; no organomegaly or masses detected. EXT: without deformities, mod arthritic changes; no varicose veins/ venous insuffic/ or edema. NEURO:   gait normal & balance OK. DERM:  No lesions noted; no rash etc...    Assessment:

## 2012-07-29 NOTE — Assessment & Plan Note (Signed)
return for fasting labs with lipid panel , tsh and liver function test.  Pneumovax today.  Low salt diet , exercise tolerated, low fat/cholesterol diet.  Follow up in 6 months and As needed

## 2012-07-29 NOTE — Assessment & Plan Note (Signed)
Controlled  Plan return for fasting labs with lipid panel , tsh and liver function test.  Pneumovax today.  Low salt diet , exercise tolerated, low fat/cholesterol diet.  Follow up in 6 months and As needed

## 2012-07-29 NOTE — Assessment & Plan Note (Signed)
Cont follow up with Endocrinology  Diet and wt loss discussed

## 2012-08-10 ENCOUNTER — Other Ambulatory Visit: Payer: Self-pay | Admitting: Pulmonary Disease

## 2012-08-10 MED ORDER — ATORVASTATIN CALCIUM 40 MG PO TABS: 40.0000 mg | ORAL_TABLET | Freq: Every day | ORAL | Status: DC

## 2012-08-10 MED ORDER — LOSARTAN POTASSIUM 100 MG PO TABS: 100.0000 mg | ORAL_TABLET | Freq: Every day | ORAL | Status: DC

## 2012-08-10 NOTE — Telephone Encounter (Signed)
Received faxed refill request from [facility] for refills and a request for 90-day supply on Lipitor 40mg  and Losartan 100mg  Last ov 6.25.14 w/ TP for 4 month follow up > follow up in 6 months w/ SN Refills sent #[phone]

## 2012-08-25 ENCOUNTER — Ambulatory Visit (HOSPITAL_BASED_OUTPATIENT_CLINIC_OR_DEPARTMENT_OTHER): Payer: Medicare Other | Admitting: Oncology

## 2012-08-25 ENCOUNTER — Telehealth: Payer: Self-pay | Admitting: Medical Oncology

## 2012-08-25 ENCOUNTER — Ambulatory Visit: Payer: Medicare Other | Admitting: Lab

## 2012-08-25 ENCOUNTER — Telehealth: Payer: Self-pay | Admitting: *Deleted

## 2012-08-25 ENCOUNTER — Encounter: Payer: Self-pay | Admitting: Oncology

## 2012-08-25 VITALS — BP 156/78 | HR 102 | Temp 97.0°F | Resp 20 | Ht 68.0 in | Wt 196.2 lb

## 2012-08-25 DIAGNOSIS — C50319 Malignant neoplasm of lower-inner quadrant of unspecified female breast: Secondary | ICD-10-CM

## 2012-08-25 DIAGNOSIS — C50312 Malignant neoplasm of lower-inner quadrant of left female breast: Secondary | ICD-10-CM

## 2012-08-25 DIAGNOSIS — Z9221 Personal history of antineoplastic chemotherapy: Secondary | ICD-10-CM

## 2012-08-25 MED ORDER — LETROZOLE 2.5 MG PO TABS: ORAL_TABLET | ORAL | Status: DC

## 2012-08-25 NOTE — Telephone Encounter (Signed)
Error

## 2012-08-25 NOTE — Telephone Encounter (Signed)
appts made and printed...td 

## 2012-08-25 NOTE — Telephone Encounter (Signed)
Pt had to rs her lab addon for today. gv appt for 08/26/12 @ 8:30am...td

## 2012-08-25 NOTE — Progress Notes (Signed)
[facility]  Telephone:([phone]) [phone] Fax:(336) [phone]   OFFICE PROGRESS NOTE   Cc:  Michele Mcalpine, MD [facility] [address] [place] [address]  Dr. Calton Dach  Dr. Chipper Herb  Dr. Romero Belling   DIAGNOSIS: 72 year old female with stage III a invasive lobular carcinoma of the left breast status post mastectomy with left axillary lymph node dissection performed on 03/23/2011.   PRIOR THERAPY:  #1 patient was originally seen by me on 03/05/2011 for a invasive lobular carcinoma of the left breast the tumor was 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 Her clinical staging was stage IIB (T3, N0, M0).   #2 the patient has gone on to have a left breast mastectomy with axillary lymph node dissection on 03/23/2011. The final pathology revealed a 6.0 cm invasive lobular carcinoma with lymphovascular invasion and perineural invasion. Invasive tumor was 2.9 cm from the nearest margin lobular carcinoma in situ was noted. Initially 6 lymph nodes were positive for metastatic carcinoma. Tumor deposits were 0.5 0.8 0.7 0.7 0.3 and 1.0 cm focal extracapsular tumor extension was noted. Tumor was grade 3. The final lymph node count was 16 9 of which were positive for metastatic disease. Tumor was estrogen receptor +100% progesterone receptor +100% HER-2/neu negative with a ratio 1.31 proliferation marker 18% final pathologic staging was p T3a pN2a, Stage IIIa.   #3.patient is status post 4 cycles of FEC combination chemotherapy starting from 04/17/2011 may 2013. Her course was complicated by development of febrile neutropenia sepsis pancytopenia and mastectomy site cellulitis and chest wall abscess requiring incision and drainage.   #4 patient had radiation to the L axilla and L chest wall under the direction of Dr. Dayton Scrape from 10/07/2011 to 11/24/2011 as follows:Left chest wall and regional lymph nodes 5040 cGy 28  sessions left mastectomy scar/chest wall boost 1000 cGy 5 sessions      #5 Port-A-Cath removal on 12/02/2011   CURRENT THERAPY:  letrozole 2.5 mg daily adjuvantly since 10/16/11  Randall An 72 y.o. female returns for a follow up appointment. Overall, she has been tolerating Femara therapy well. She denies any  bone pain,  myalgia, hot flashes, vaginal discharge or increased fatigue . Denies nausea or headache. No xerostomia. Main complaint is intermittent dizziness, but she states it may be related to elevated blood sugars, this morning being at 2019. She is taking Femara during the day. In addition, she is recovering from bronchitis, associated with non productive cough. Patient is to see Dr. Everardo All in an upcoming appointment, at which time she is to address these issues.  SHe haas completed her radiation therapy to the L chest wall and axillary area (Dr. Dayton Scrape) on 10/22, follow by [facility] Cath removal on 12/02/11. She denies any chest wall pain, other than chronic radiation tenderness.    Past Medical History  Diagnosis Date  . DIABETES MELLITUS 08/10/2007  . 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 HYPERCHOLESTEROLEMIA 12/17/2006  . OBESITY 06/04/2008  . ANXIETY 06/04/2008  . DEPRESSION 08/10/2007  . HYPERTENSION 12/17/2006  . ASTHMATIC BRONCHITIS, ACUTE 06/04/2008  . ALLERGIC RHINITIS 12/13/2007  . GERD 12/17/2006  . DEGENERATIVE JOINT DISEASE 12/17/2006  . Asthma   . Blood transfusion 1964    post childbirth  . 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 History of chemotherapy   . History of radiation therapy 10/07/11-11/24/11    left  breast,5040 cGy/28 sessions,l chest wall boost=1000cGy/5 sesions    Past Surgical History  Procedure Laterality Date  . Laminectomy  1993    s/p-Dr. Jule Ser  . Bladder tack  1974  . Cholecystectomy  02/23/1991    LC - Dr Jamey Ripa  . Tonsillectomy    . Rotator cuff repair  8/03    right, Dr. Shelle Iron  . Enterocele repair   06/08    Dr. Estanislado Pandy  . Rotator cuff repair  10/26/2008    left, Dr. Rayburn Ma  . Hip surgery  08/27/2010    DR. Despina Hick  . Breast excisional biopsy  11/10/1988    left benign Dr Jamey Ripa  . Breast excisional biopsy  03/04/1983    Right - two areas - Dr Jamey Ripa  . Breast excisional biopsy  10/09/1980    right - Dr Jamey Ripa  . Breast excisional biopsy  10/18/1979    left - Dr Jamey Ripa  . Breast excisional biopsy  01/02/1994    right - Dr Jamey Ripa  . Vaginal hysterectomy  1974    partial   . Colonoscopy    . Modified radical mastectomy w/ axillary lymph node dissection      03-30-11 LT  . Portacath placement  04/13/2011    Procedure: INSERTION PORT-A-CATH;  Surgeon: Currie Paris, MD;  Location: [facility];  Service: General;  Laterality: N/A;  porta cath placement,removal of J-P drain  . Mastectomy  03/23/2011    left  . Drain seroma  05/01/11    Chronic seroma @mastectomy  site  . Evacuation breast hematoma  07/01/2011    Procedure: EVACUATION HEMATOMA BREAST;  Surgeon: Currie Paris, MD;  Location: [facility];  Service: General;  Laterality: Left;  Drainage of left mastectomy seroma  . Breast surgery    . Port-a-cath removal  12/02/2011    Procedure: REMOVAL PORT-A-CATH;  Surgeon: Currie Paris, MD;  Location: [facility];  Service: General;  Laterality: N/A;    Current Outpatient Prescriptions  Medication Sig Dispense Refill  . acyclovir (ZOVIRAX) 200 MG capsule TAKE TWO CAPSULES BY MOUTH THREE TIMES DAILY  120 capsule  5  . Alcohol Swabs PADS Use once daily to check blood glucose as directed      . ALPRAZolam (XANAX) 0.5 MG tablet TAKE ONE-HALF TO ONE TABLET BY MOUTH THREE TIMES DAILY AS NEEDED FOR  NERVES  90 tablet  1  . amLODipine (NORVASC) 10 MG tablet Take 1 tablet (10 mg total) by mouth daily.  30 tablet  11  . atorvastatin (LIPITOR) 40 MG tablet Take 1 tablet (40 mg total) by mouth daily.  90 tablet  3  . Blood Glucose Monitoring Suppl (EASY  TALK BLOOD GLUCOSE SYSTEM) DEVI Use once daily as directed to check blood glucose      . EASY TALK BLOOD GLUCOSE TEST test strip Use to check blood glucose once daily as directed      . hydrochlorothiazide (HYDRODIURIL) 25 MG tablet Take 1 tablet (25 mg total) by mouth daily as needed.  30 tablet  5  . Insulin Glargine (LANTUS SOLOSTAR) 100 UNIT/ML SOPN Inject 50 Units into the skin at bedtime.  5 pen  11  . Insulin Lispro, Human, (HUMALOG KWIKPEN [place]) 45 Units 3 (three) times daily before meals. 3 times a day (just before each meal) 20-5-20 units      . letrozole (FEMARA) 2.5 MG tablet TAKE ONE TABLET BY MOUTH DAILY.  90 tablet  12  . losartan (COZAAR) 100  MG tablet Take 1 tablet (100 mg total) by mouth daily.  90 tablet  3  . naproxen (NAPROSYN) 500 MG tablet       . omeprazole (PRILOSEC) 20 MG capsule Take 1 capsule (20 mg total) by mouth daily.  30 capsule  11  . sertraline (ZOLOFT) 50 MG tablet Take 1 tablet (50 mg total) by mouth daily.  30 tablet  5  . Hydrocodone-Acetaminophen 5-300 MG TABS       . [DISCONTINUED] prochlorperazine (COMPAZINE) 10 MG tablet Take 1 tablet (10 mg total) by mouth every 6 (six) hours as needed (Nausea or vomiting).  30 tablet  1   No current facility-administered medications for this visit.    ALLERGIES:  is allergic to cephalexin; clindamycin; dilaudid; oxycodone-acetaminophen; rocephin; and sulfonamide derivatives.  REVIEW OF SYSTEMS:  No respiratory or cardiac complaints.  The rest of the 14-point review of system was negative.   Filed Vitals:   08/25/12 0934  BP: 156/78  Pulse: 102  Temp: 97 F (36.1 C)  Resp: 20   Wt Readings from Last 3 Encounters:  08/25/12 196 lb 3.2 oz (88.996 kg)  07/27/12 193 lb 6.4 oz (87.726 kg)  07/05/12 188 lb (85.276 kg)     PHYSICAL EXAMINATION:  BP 156/78  Pulse 102  Temp(Src) 97 F (36.1 C) (Oral)  Resp 20  Ht 5\' 8"  (1.727 m)  Wt 196 lb 3.2 oz (88.996 kg)  BMI 29.84 kg/m2 GENERAL: Well developed, well  nourished, in no acute distress.  EENT: No ocular or oral lesions. No stomatitis.  RESPIRATORY: Lungs are clear to auscultation bilaterally  CARDIAC: No murmur, rub or tachycardia. No upper or lower extremity edema.  GI: Abdomen is soft, no palpable hepatosplenomegaly. No fluid wave. No tenderness. Musculoskeletal: No kyphosis, no tenderness over the spine, ribs or hips. Lymph: No cervical, infraclavicular, axillary or inguinal adenopathy. Neuro: No focal neurological deficits. Psych: Alert and oriented X 3, appropriate mood and affect.  BREAST EXAM:Left mastectomy site is well healed, with some tenderness at the radiation regions with residual hyperpigmentation the skin  without any visible new masses. R breast without any masses. No axillary lymphadenopathy.    LABORATORY/RADIOLOGY DATA:  Lab Results  Component Value Date   WBC 6.6 02/25/2012   HGB 13.9 02/25/2012   HCT 41.6 02/25/2012   PLT 207 02/25/2012   GLUCOSE 232* 02/25/2012   CHOL 305* 02/06/2011   TRIG 272.0* 02/06/2011   HDL 43.10 02/06/2011   LDLDIRECT 212.7 02/06/2011   ALKPHOS 125 02/25/2012   ALT 17 02/25/2012   AST 12 02/25/2012   NA 141 02/25/2012   K 3.5 02/25/2012   CL 102 02/25/2012   CREATININE 0.8 02/25/2012   BUN 11.0 02/25/2012   CO2 28 02/25/2012   INR 0.99 08/20/2010   HGBA1C 8.9* 07/05/2012   MICROALBUR 42.2* 04/06/2012    No results found.     ASSESSMENT   1. Randall An 72 y.o. female with   #1 invasive lobular carcinoma of the left breast status post mastectomy with axillary lymph node dissection. The final pathology revealed a T3 N2 way invasive lobular carcinoma. The tumor measured 6.0 cm 9 of 16 lymph nodes were positive for metastatic disease. Tumor was ER positive PR positive HER-2/neu negative proliferation marker 18%.   #2 patient is now status post 4 cycles of combination chemotherapy consisting of FEC. Her last treatment was given on May 2013. She did require recurrent hospitalizations throughout  this treatment. She's had recurrent  infections most recently chest wall [place]. She did become significantly pancytopenic.   #3 Antiestrogen therapy for her estrogen receptor positive tumor in the form of  letrozole 2.5 mg daily.   #4 patient has completed  radiation therapy on 11/24/2011  #5 Patient had  Her Port-A-Cath removed by  Dr. Jamey Ripa on 12/02/2011.    PLAN: 1. Ms Affinito [facility] up visit in 6 months. In the interim, she is to continue to take letrozole 2.5 mg , but has been instructed to take it at night.   2. Patient is due to see Dr. Everardo All regarding her elevated blood sugars that may be contributing to her intermittent dizziness.  Drue Second, MD Medical/Oncology [facility] [phone] (beeper) [phone] (Office)

## 2012-08-25 NOTE — Telephone Encounter (Deleted)
Patient call to request her appts changed fridays d/t work schedule. Per Dr Milta Deiters office note, ok to do. Onc tx sent.  Patient also requesting to have a form, she will fax to [place], for social services that needs to be completed so she may continue to receive medicaid. Asked patient to fax form to [phone] with attn to [facility] in managed care. Patient requesting for form to be faxed directly back to Social Services with the Attn to Ms. Wade @ [phone].

## 2012-08-25 NOTE — Patient Instructions (Addendum)
Continue letrozole 2.5 mg daily  I will see you back in 6 months 

## 2012-08-26 ENCOUNTER — Other Ambulatory Visit (HOSPITAL_BASED_OUTPATIENT_CLINIC_OR_DEPARTMENT_OTHER): Payer: Medicare Other | Admitting: Lab

## 2012-08-26 DIAGNOSIS — Z9221 Personal history of antineoplastic chemotherapy: Secondary | ICD-10-CM

## 2012-08-26 DIAGNOSIS — C50312 Malignant neoplasm of lower-inner quadrant of left female breast: Secondary | ICD-10-CM

## 2012-08-26 DIAGNOSIS — C50319 Malignant neoplasm of lower-inner quadrant of unspecified female breast: Secondary | ICD-10-CM

## 2012-08-26 LAB — COMPREHENSIVE METABOLIC PANEL (CC13)
ALT: 24 U/L (ref 0–55)
Albumin: 3.4 g/dL — ABNORMAL LOW (ref 3.5–5.0)
Alkaline Phosphatase: 137 U/L (ref 40–150)
Glucose: 132 mg/dl (ref 70–140)
Potassium: 3.8 mEq/L (ref 3.5–5.1)
Sodium: 144 mEq/L (ref 136–145)
Total Protein: 6.9 g/dL (ref 6.4–8.3)

## 2012-08-26 LAB — CBC WITH DIFFERENTIAL/PLATELET
Eosinophils Absolute: 0.2 10*3/uL (ref 0.0–0.5)
MCV: 88.6 fL (ref 79.5–101.0)
MONO#: 0.5 10*3/uL (ref 0.1–0.9)
MONO%: 10.3 % (ref 0.0–14.0)
NEUT#: 2.8 10*3/uL (ref 1.5–6.5)
RBC: 4.7 10*6/uL (ref 3.70–5.45)
RDW: 14.2 % (ref 11.2–14.5)
WBC: 5 10*3/uL (ref 3.9–10.3)

## 2012-08-28 LAB — LIPID PANEL
LDL Cholesterol: 111 mg/dL — ABNORMAL HIGH (ref 0–99)
Triglycerides: 123 mg/dL (ref <150)

## 2012-09-05 ENCOUNTER — Telehealth: Payer: Self-pay | Admitting: Medical Oncology

## 2012-09-05 NOTE — Telephone Encounter (Signed)
Pt called requesting results to labs from 08/26/12. Results given over the phone and pt request for them to be mailed to her. Results placed in mail today, per pt request.

## 2012-09-12 ENCOUNTER — Other Ambulatory Visit (INDEPENDENT_AMBULATORY_CARE_PROVIDER_SITE_OTHER): Payer: Self-pay | Admitting: *Deleted

## 2012-09-12 DIAGNOSIS — C50919 Malignant neoplasm of unspecified site of unspecified female breast: Secondary | ICD-10-CM

## 2012-09-12 MED ORDER — UNABLE TO FIND: Status: DC

## 2012-10-05 ENCOUNTER — Encounter: Payer: Self-pay | Admitting: Endocrinology

## 2012-10-05 ENCOUNTER — Ambulatory Visit (INDEPENDENT_AMBULATORY_CARE_PROVIDER_SITE_OTHER): Payer: Medicare Other | Admitting: Endocrinology

## 2012-10-05 VITALS — BP 132/80 | HR 78 | Ht 67.0 in | Wt 199.0 lb

## 2012-10-05 DIAGNOSIS — R209 Unspecified disturbances of skin sensation: Secondary | ICD-10-CM

## 2012-10-05 DIAGNOSIS — E119 Type 2 diabetes mellitus without complications: Secondary | ICD-10-CM

## 2012-10-05 DIAGNOSIS — R252 Cramp and spasm: Secondary | ICD-10-CM

## 2012-10-05 DIAGNOSIS — R200 Anesthesia of skin: Secondary | ICD-10-CM

## 2012-10-05 LAB — VITAMIN B12: Vitamin B-12: 228 pg/mL (ref 211–911)

## 2012-10-05 LAB — HEMOGLOBIN A1C: Hgb A1c MFr Bld: 8.2 % — ABNORMAL HIGH (ref 4.6–6.5)

## 2012-10-05 MED ORDER — INSULIN PEN NEEDLE 31G X 8 MM MISC: 1.0000 | Freq: Four times a day (QID) | Status: AC

## 2012-10-05 NOTE — Progress Notes (Signed)
Subjective:    Patient ID: [name], female    DOB: [birth year], 72 y.o.   MRN: [id number]  HPI Pt returns for f/u of type 2 DM (dx'ed 2007; she has mild if any neuropathy of the lower extremities; no known associated complications; she has never had severe hypoglycemia or DKA; she has finished XRT, and chemotx).  no cbg record, but states cbg's are low in the afternoon, approximately twice a month, usually in the afternoon. There is otherwise no trend throughout the day.  pt states she feels well in general, except for vertigo and numbness of the toes.  She has leg cramps.   Past Medical History  Diagnosis Date  . DIABETES MELLITUS 08/10/2007  . 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 HYPERCHOLESTEROLEMIA 12/17/2006  . OBESITY 06/04/2008  . ANXIETY 06/04/2008  . DEPRESSION 08/10/2007  . HYPERTENSION 12/17/2006  . ASTHMATIC BRONCHITIS, ACUTE 06/04/2008  . ALLERGIC RHINITIS 12/13/2007  . GERD 12/17/2006  . DEGENERATIVE JOINT DISEASE 12/17/2006  . Asthma   . Blood transfusion 1964    post childbirth  . 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 History of chemotherapy   . History of radiation therapy 10/07/11-11/24/11    left breast,5040 cGy/28 sessions,l chest wall boost=1000cGy/5 sesions    Past Surgical History  Procedure Laterality Date  . Laminectomy  1993    s/p-Dr. Jule Ser  . Bladder tack  1974  . Cholecystectomy  02/23/1991    LC - Dr Jamey Ripa  . Tonsillectomy    . Rotator cuff repair  8/03    right, Dr. Shelle Iron  . Enterocele repair  06/08    Dr. Estanislado Pandy  . Rotator cuff repair  10/26/2008    left, Dr. Rayburn Ma  . Hip surgery  08/27/2010    DR. Despina Hick  . Breast excisional biopsy  11/10/1988    left benign Dr Jamey Ripa  . Breast excisional biopsy  03/04/1983    Right - two areas - Dr Jamey Ripa  . Breast excisional biopsy  10/09/1980    right - Dr Jamey Ripa  . Breast excisional biopsy  10/18/1979    left - Dr Jamey Ripa  . Breast excisional biopsy  01/02/1994     right - Dr Jamey Ripa  . Vaginal hysterectomy  1974    partial   . Colonoscopy    . Modified radical mastectomy w/ axillary lymph node dissection      03-30-11 LT  . Portacath placement  04/13/2011    Procedure: INSERTION PORT-A-CATH;  Surgeon: Currie Paris, MD;  Location: [facility];  Service: General;  Laterality: N/A;  porta cath placement,removal of J-P drain  . Mastectomy  03/23/2011    left  . Drain seroma  05/01/11    Chronic seroma @mastectomy  site  . Evacuation breast hematoma  07/01/2011    Procedure: EVACUATION HEMATOMA BREAST;  Surgeon: Currie Paris, MD;  Location: [facility];  Service: General;  Laterality: Left;  Drainage of left mastectomy seroma  . Breast surgery    . Port-a-cath removal  12/02/2011    Procedure: REMOVAL PORT-A-CATH;  Surgeon: Currie Paris, MD;  Location: [facility];  Service: General;  Laterality: N/A;    History   Social History  . Marital Status: Married    Spouse Name: N/A    Number of Children: 3  . Years of Education: N/A   Occupational History  .     Social History  Main Topics  . Smoking status: Never Smoker   . Smokeless tobacco: Never Used  . Alcohol Use: No  . Drug Use: No  . Sexual Activity: Yes   Other Topics Concern  . 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 acyclovir (ZOVIRAX) 200 MG capsule TAKE TWO CAPSULES BY MOUTH THREE TIMES DAILY  120 capsule  5  . Alcohol Swabs PADS Use once daily to check blood glucose as directed      . ALPRAZolam (XANAX) 0.5 MG tablet TAKE ONE-HALF TO ONE TABLET BY MOUTH THREE TIMES DAILY AS NEEDED FOR  NERVES  90 tablet  1  . amLODipine (NORVASC) 10 MG tablet Take 1 tablet (10 mg total) by mouth  daily.  30 tablet  11  . atorvastatin (LIPITOR) 40 MG tablet Take 1 tablet (40 mg total) by mouth daily.  90 tablet  3  . Blood Glucose Monitoring Suppl (EASY TALK BLOOD GLUCOSE SYSTEM) DEVI Use once daily as directed to check blood glucose      . EASY TALK BLOOD GLUCOSE TEST test strip Use to check blood glucose once daily as directed      . hydrochlorothiazide (HYDRODIURIL) 25 MG tablet Take 1 tablet (25 mg total) by mouth daily as needed.  30 tablet  5  . Hydrocodone-Acetaminophen 5-300 MG TABS       . Insulin Lispro, Human, (HUMALOG KWIKPEN [place]) 3 times a day (just before each meal) 25-5-25 units      . letrozole (FEMARA) 2.5 MG tablet TAKE ONE TABLET BY MOUTH DAILY.  90 tablet  12  . losartan (COZAAR) 100 MG tablet Take 1 tablet (100 mg total) by mouth daily.  90 tablet  3  . naproxen (NAPROSYN) 500 MG tablet       . omeprazole (PRILOSEC) 20 MG capsule Take 1 capsule (20 mg total) by mouth daily.  30 capsule  11  . sertraline (ZOLOFT) 50 MG tablet Take 1 tablet (50 mg total) by mouth daily.  30 tablet  5  . UNABLE TO FIND RX: [id number]- Silicone Breast prosthesis Replacement,left. DX: 174.9; Left Breast Mastectomy due to left breast cancer Quantity: 1  1 each  0  . [DISCONTINUED] prochlorperazine (COMPAZINE) 10 MG tablet Take 1 tablet (10 mg total) by mouth every 6 (six) hours as needed (Nausea or vomiting).  30 tablet  1   No current facility-administered medications on file prior to visit.    Allergies  Allergen Reactions  . Cephalexin     REACTION: hives  . Clindamycin     REACTION: hives  . Dilaudid [Hydromorphone Hcl] Itching  . Oxycodone-Acetaminophen     REACTION: hives  . Rocephin [Ceftriaxone Sodium In Dextrose] Hives  . Sulfonamide Derivatives     REACTION: hives    Family History  Problem Relation Age of Onset  . COPD Mother   . Arthritis Mother   . Emphysema Mother   . Mental illness Paternal Grandfather   . Heart disease Other     Sibling  . Diabetes Other      Sibling   . Diabetes Brother   . Cancer Brother     prostate  .  Cancer Father     malignant brain tumor   BP 132/80  Pulse 78  Ht 5\' 7"  (1.702 m)  Wt 199 lb (90.266 kg)  BMI 31.16 kg/m2  SpO2 94%  Review of Systems Denies LOC and weight change     Objective:   Physical Exam VITAL SIGNS:  See vs page GENERAL: no distress  Lab Results  Component Value Date   HGBA1C 8.2* 10/05/2012      Assessment & Plan:  DM: This insulin regimen was chosen from multiple options, as it best matches her insulin to her changing requirements throughout the day.  The benefits of glycemic control must be weighed against the risks of hypoglycemia.  She needs increased rx. Numbness of the toes.  This could be due to either DM or chemotx.

## 2012-10-05 NOTE — Patient Instructions (Addendum)
check your blood sugar 2 times a day.  vary the time of day when you check, between before the 3 meals, after meals, and at bedtime.  also check if you have symptoms of your blood sugar being too high or too low.  please keep a record of the readings and bring it to your next appointment here.  please call us sooner if your blood sugar goes below 70, or if it stays over 200.   blood tests are being requested for you today.  We'll contact you with results.    Please come back for a follow-up appointment in 3 months.   

## 2012-10-06 LAB — VITAMIN D 25 HYDROXY (VIT D DEFICIENCY, FRACTURES): Vit D, 25-Hydroxy: 33 ng/mL (ref 30–89)

## 2012-12-08 ENCOUNTER — Other Ambulatory Visit: Payer: Self-pay

## 2013-01-04 ENCOUNTER — Ambulatory Visit: Payer: Medicare Other | Admitting: Endocrinology

## 2013-01-04 DIAGNOSIS — Z0289 Encounter for other administrative examinations: Secondary | ICD-10-CM

## 2013-01-30 ENCOUNTER — Telehealth: Payer: Self-pay | Admitting: Pulmonary Disease

## 2013-01-30 MED ORDER — LEVOFLOXACIN 500 MG PO TABS: 500.0000 mg | ORAL_TABLET | Freq: Every day | ORAL | Status: DC

## 2013-01-30 NOTE — Telephone Encounter (Signed)
Pt aware of recs. rx sent. Nothing further needed 

## 2013-01-30 NOTE — Telephone Encounter (Signed)
Called, spoke with pt.  C/o chest congestion, prod cough with clear, yellow, to dark mucus, wheezing, and temp up to 99.  Symptoms started x 4 days ago.  No increased SOB, chest tightness.  Is taking mucinex bid, aleve, and used nebs this am.  She is requesting rx.  Dr. Kriste Basque, pls advise.  Thank you.  Last OV with SN: 2012 Pt was seen by TP 07/27/12 No pending OV  [facility]  Allergies verified with pt: Allergies  Allergen Reactions  . Cephalexin     REACTION: hives  . Clindamycin     REACTION: hives  . Dilaudid [Hydromorphone Hcl] Itching  . Oxycodone-Acetaminophen     REACTION: hives  . Rocephin [Ceftriaxone Sodium In Dextrose] Hives  . Sulfonamide Derivatives     REACTION: hives

## 2013-01-30 NOTE — Telephone Encounter (Signed)
Per SN---  levaquin 500 mg #7  1 daily Align once daily Increase fluids Rest  tylenol

## 2013-02-23 ENCOUNTER — Ambulatory Visit (INDEPENDENT_AMBULATORY_CARE_PROVIDER_SITE_OTHER): Payer: Medicare HMO | Admitting: Pulmonary Disease

## 2013-02-23 ENCOUNTER — Encounter: Payer: Self-pay | Admitting: Pulmonary Disease

## 2013-02-23 VITALS — BP 136/80 | HR 72 | Temp 99.0°F | Ht 68.0 in | Wt 199.0 lb

## 2013-02-23 DIAGNOSIS — C50319 Malignant neoplasm of lower-inner quadrant of unspecified female breast: Secondary | ICD-10-CM

## 2013-02-23 DIAGNOSIS — E119 Type 2 diabetes mellitus without complications: Secondary | ICD-10-CM

## 2013-02-23 DIAGNOSIS — K219 Gastro-esophageal reflux disease without esophagitis: Secondary | ICD-10-CM

## 2013-02-23 DIAGNOSIS — I10 Essential (primary) hypertension: Secondary | ICD-10-CM

## 2013-02-23 DIAGNOSIS — M545 Low back pain, unspecified: Secondary | ICD-10-CM | POA: Insufficient documentation

## 2013-02-23 DIAGNOSIS — F411 Generalized anxiety disorder: Secondary | ICD-10-CM

## 2013-02-23 DIAGNOSIS — J209 Acute bronchitis, unspecified: Secondary | ICD-10-CM

## 2013-02-23 DIAGNOSIS — Z23 Encounter for immunization: Secondary | ICD-10-CM

## 2013-02-23 DIAGNOSIS — E7800 Pure hypercholesterolemia, unspecified: Secondary | ICD-10-CM

## 2013-02-23 MED ORDER — ONE-A-DAY WOMENS 50+ ADVANTAGE PO TABS: 1.0000 | ORAL_TABLET | Freq: Every day | ORAL | Status: AC

## 2013-02-23 MED ORDER — AMLODIPINE BESYLATE 10 MG PO TABS: 10.0000 mg | ORAL_TABLET | Freq: Every day | ORAL | Status: DC

## 2013-02-23 MED ORDER — ALPRAZOLAM 0.5 MG PO TABS: ORAL_TABLET | ORAL | Status: AC

## 2013-02-23 MED ORDER — CYANOCOBALAMIN 500 MCG PO TABS: 500.0000 ug | ORAL_TABLET | Freq: Every day | ORAL | Status: AC

## 2013-02-23 MED ORDER — SERTRALINE HCL 50 MG PO TABS: 50.0000 mg | ORAL_TABLET | Freq: Every day | ORAL | Status: AC

## 2013-02-23 MED ORDER — LOSARTAN POTASSIUM 100 MG PO TABS: 100.0000 mg | ORAL_TABLET | Freq: Every day | ORAL | Status: DC

## 2013-02-23 MED ORDER — VITAMIN D 1000 UNITS PO TABS: 1000.0000 [IU] | ORAL_TABLET | Freq: Every day | ORAL | Status: AC

## 2013-02-23 MED ORDER — ACYCLOVIR 200 MG PO CAPS: ORAL_CAPSULE | ORAL | Status: DC

## 2013-02-23 MED ORDER — OMEPRAZOLE 20 MG PO CPDR: 20.0000 mg | DELAYED_RELEASE_CAPSULE | Freq: Every day | ORAL | Status: DC

## 2013-02-23 MED ORDER — ATORVASTATIN CALCIUM 40 MG PO TABS: 40.0000 mg | ORAL_TABLET | Freq: Every day | ORAL | Status: DC

## 2013-02-23 NOTE — Progress Notes (Signed)
Subjective:     Patient ID: [name], female   DOB: Nov 12, 1940, 73 y.o.   MRN: [id number]  HPI 73 y/o WF here for a follow up visit... she has multiple medical problems including HBP, Chol, DM, Obesity, etc...  ~  March 05, 2010:  11mo ROV >     [name] saw [name] for her DM 6/11> she stopped her Glimep on her own due to ?hypoglycemia ("it brings me down too quick") & she wanted to stay on the Metformin & Januvia alone; her BS has been >200 at home and A1c ~8 on checks last yr;  she is not on diet ("I crave sweets") & not exercising;  she is asking about Victoza to help her lose wt & we discussed a comprehensive program of diet (w/ nutritional counselling), exercise, & meds= Metformin500Bid + Victoza 0.6 ==> 1.$RemoveB'2mg'gzByqAsu$  daily.    She notes continue Ortho prob w/ bursitis etc; had shot in right hip recently & may need surg per [name] she says.    She notes that she switched Gastroenterologists to "the oriental lady" meaning ?[name] & apparently had EGD & Colonoscopy ?several yrs ago, we don't have these reports, nothing avail in [place], she states "several polyps removed".  ~  January 14, 2011:  3mo ROV & she is c/o recent bronchitis w/ cough, green sputum production, & aching/ sore/ HA/ and sl wheezy & SOB; she had ZPak called in x2 & has been taking Mucinex/ Fluids/ etc; finally went to an [name] & was given Levaquin & that's when she started to improve she says; she also had the flu vaccine 9/12; she notes that she is finally better & approaching her baseline...  HBP> on Amlodip10, Benazepril40, HCT25 (takes 1/2-1 prn w/ K20/d); BP= 130/72 & she notes that she only take the diuretic in the summer "when I swell"; denies CP, palpit, dizzy, SOB, edema...  CHOL> prev on Cres40 but she stopped on her own "couldn't afford it" & she didn't call for alternative Rx; we decided to check FLP on diet alone & go from there (she will ret for FLP); discussed [facility] for her...  DM>  Followed by [name] but last seen 2/12 on Metform & Victoza; he tried to incr her Metformin to [facility] but she has since decreased the Metform500Bid & stopped the Victoza; she tells me she is taking Januvia50/d "I have samples" she says; she says home accuchecks are "around 150" but no recent labs avail & she will ret for fasting blood work...  OBESITY> weight is 198# which is down 5# over the last year; she is congratulated & encouraged to keep up the great work; we reviewed low carb low fat diet & exercise prescription...  GERD/ Polyps> Followed by [name] but we don't have any notes from her; on Prilosec20 & she denies GI symptoms at this time...  Hx Urinary Incont> Followed by [name] GYN & [name] Urology...  DJD/ LBP> she had right hip bursectomy 7/12 by [name] for intractable right hip bursitis; she takes Naprosyn as needed...  Anxiety/ Depression> on Zoloft $RemoveB'50mg'fZvfQlPi$ /d and Xanax 0,$RemoveBef'5mg'ctdevRcSUN$  but states that she rarely takes it...  ~  February 23, 2013:  64yr ROV & a lot has transpired for [name] in the interim>     She has had several visits w/ TP> recurrent bronchitis requiring ZPak, Pred, discontinuation of ACE rx (switched to [facility]B), given Pneumovax,     She saw [name] 9/14> DM on Lantus55, Humalog 3 shots daily (25-5-25); A1c  was 8.2 & [name] stressed diet & adjusting her insulin...     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 [name] removed her port-a-cath 10/13...  HBP> on Amlodip10, Losartan100; BP= 136/80 & she denies CP, palpit, dizzy, SOB, edema...  CHOL> on Lipitor40; he weight is identical to 26yrs ago despite her cancer ordeal; last FLP 7/14 showed TChol 188, TG 123, HDL 52, LDL 111  & we reviewed diet & exercise recommendations...  DM> Followed by [name] as above on combination Lantus daily & Humalog Tid; Last A1c was 9/14 = 8.2  OBESITY> weight is 198# which is unchanged from 67yrs ago; we reviewed low carb low fat diet & exercise prescription...  GERD/ Polyps> Followed by [name] but we don't have any notes from her; on Prilosec20 & she denies GI symptoms at this time...  Hx Urinary Incont> Followed by [name] GYN & [name] Urology...  BREAST CANCER> Diagnosed 2/13 w/ Surg, ChemoRx, XRT, now on Femara under the direction of [name]...  DJD/ LBP> she had right hip bursectomy 7/12 by [name] for intractable right hip bursitis; she takes Naprosyn as needed, off prev Vicodin...  Anxiety/ Depression> on Zoloft $RemoveB'50mg'XWRTYyLJ$ /d and Xanax 0.$RemoveBef'5mg'tDrrSuPZvJ$  but states that she rarely takes it... We reviewed prob list, meds, xrays and labs> see below for updates >>  She will start seeing [name] in [place] for primary care...           Problem List:   ALLERGIC RHINITIS (ICD-477.9) - uses OTC meds Prn...  Hx of ASTHMATIC BRONCHITIS, ACUTE (ICD-466.0) - uses NEB w/ Albuterol Prn & she reports breathing better since the last acute exac... ~  12/12: she reports bronchitic exac that didn't respond to 2 ZPaks but got better after Levaquin given via ER... ~  2013-14: she had several bouts of bronchitis treated by TP w. Antibiotics & occas requiring Pred, Mucinex, etc...  HYPERTENSION (ICD-401.9) - currently on AMLODIPINE $RemoveBefor'10mg'mbMOaYTiZBDC$ /d, BENAZEPRIL $RemoveBefore'40mg'kSvBOCPsrWnWa$ /d (and HCTZ $RemoveB'25mg'okaAbvoU$  + KCl 38mEq which she takes just as needed for swelling)...  ~  2/12:  BP= 132/66 today and not checking BP at home... denies HA, visual changes, CP, palipit, dizziness, syncope, dyspnea, edema, etc.  ~  Labs 2/12 showed norm Electrolytes, Renal, blood count etc... ~  12/12:  BP= 130/72 & she remains asymptomtic w/o CP, palpit, SOB, edema, etc... ~  1/15: on Amlodip10, Losartan100; BP= 136/80 & she denies CP,  palpit, dizzy, SOB, edema.  Hx of CHEST PAIN, ATYPICAL (ICD-786.59) - she was seen in the ER by DrPulsipher in 2004 w/ atypical CP... her daughter, Jenny Reichmann, is an Health visitor... ~  NuclearStressTest 9/04 was normal- no scar or ischemia, EF= 73%...  HYPERCHOLESTEROLEMIA (ICD-272.0) - prev on Simva80, but not on much of a diet; Changed to CRESTOR $RemoveBe'40mg'gBraSaPaK$ /d but she stopped this on her own (too $$); asked to take FISH OIL daily. ~  previous labs showed cholesterol as high as 350 in the past... prev on numerous statin meds. ~  [place] 11/08 ?off Lova40 showed TChol 252, TG 236, HDL 49, LDL 163 ~  FLP 10/09 on Simva80 showed TChol 212, TG 124, HDL 64, LDL 119... reminded to take meds regularly. ~  FLP 4/10 on Simva80 showed TChol  206, TG 95, HDL 71, LDL 112... I checked w/ her pharm- filled Simva80 #30 on 2/24 & 4/20. ~  [place] 2/12 on ?Simva80 showed TChol 240, TG 95, HDL 75, LDL 154... rec switch to Cres40. ~  She never ret for FLP on the Crestor & stopped this on her own> encouraged f/u in [facility]... ~  [name] tried Welchol too but she stopped this as well- ?why... ~  1/15: on Lipitor40; he weight is identical to 31yrs ago despite her cancer ordeal; last FLP 7/14 showed TChol 188, TG 123, HDL 52, LDL 111 & we reviewed diet & exercise recommendations.  DIABETES MELLITUS (ICD-250.00) - prev on METFORMIN 500mg Bid & Glimepiride 2mg /d (she stopped on her own "it brings me down to quick") so JANUVIA 100mg /d added by [name]; pt not on diet, not exercising, & "I crave sweets"; she requested Victoza 2/12 & this was prescribed but she stopped it on her own & didn't f/u; [name] tried to incr her Metformin to 1000mg Bid but she decr back to 500Bid on her own; she also started taking Januvia50mg /d "I had samples"... ~  labs 11/08 (wt= 211#) showed BS= 129, HgA1c= 6.6.Marland Kitchen. ~  labs 10/09 (wt= 216#) showed BS= 109, HgA1c= 6.7... ~  labs 4/10 (wt= 211#) showed BS= 127, HgA1c= 7.3.Marland Kitchen. rec> get on diet, incr Metform Bid. ~   labs 8/10 (wt=213#) showed BS= 154, A1c= 7.1 ~  labs 2/12 (wt=203#) showed BS= 167, A1c= 8.0... Januvia ch to VICTOZA 0.6 =>1.2mg  daily; she subseq stopped on her own... ~  12/12:  On Metform500Bid & Januvia 50mg /d she says; wt is down 5# to 198#; she will ret for fasting blood work & f/u w/ [name]... ~  1/15: Followed by [name] as above on combination Lantus daily & Humalog Tid; Last A1c was 9/14 = 8.2   OBESITY (ICD-278.00) - her weight has been betw 200-215 x yrs... she is 5\' 8"  tall for a BMI~ 32-33... she says her weight is much lower on her home scales... discussed diet + exercise program required to lose weight...  ~  1/15:  Weight = 198# which is the same as 2 yrs ago despite her breast cancer ordeal...  GERD (ICD-530.81) - on OMEPRAZOLE 20mg /d... she had EGD 11/04 by DrStark w/ [place], stricture- dilated, gastritis & polyp... ? of COLON POLYPS ? we don't have records from [facility]... ~  she had a normal colonoscopy 11/04 from DrStark x tortuous colon... Rx w/ Levsin Prn. ~  she reports switching gastroenterologist ?[name] w/ EGD & Colon done ?when- she reports several polyps removed.  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 uses ACYCLOVIR 400mg  Tid for outbreaks.  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 [name] removed her port-a-cath 10/13...  DEGENERATIVE JOINT DISEASE (ICD-715.90) - she gets freq shots for bursitis from Ortho. Hx of BACK PAIN, LUMBAR (ICD-724.2) - with prev lumbar laminectomy by Lannie Fields...  ANXIETY (ICD-300.00) - on ALPRAZOLAM 0.5mg  Tid Prn "I don't use it often"... DEPRESSION (ICD-311) - on ZOLOFT 50mg /d.Marland KitchenMarland Kitchen  Past Surgical History  Procedure Laterality Date  . Laminectomy  1993    s/p-Dr. Rita Ohara  . Bladder tack  1974  . Cholecystectomy  02/23/1991    [facility] - Dr Margot Chimes  . Tonsillectomy    . Rotator cuff repair  8/03    right, Dr. Tonita Cong  . Enterocele repair  06/08    Dr. Cletis Media  . Rotator cuff repair  10/26/2008    left, Dr. Rush Farmer  . Hip surgery  08/27/2010    DR. Maureen Ralphs  . Breast excisional biopsy  11/10/1988    left benign Dr Margot Chimes  . Breast excisional biopsy  03/04/1983    Right - two areas - Dr Margot Chimes  . Breast excisional biopsy  10/09/1980    right - Dr Margot Chimes  . Breast excisional biopsy  10/18/1979    left - Dr Margot Chimes  . Breast excisional biopsy  01/02/1994    right - Dr Margot Chimes  . Vaginal hysterectomy  1974    partial   . Colonoscopy    . Modified radical mastectomy w/ axillary lymph node dissection      03-30-11 LT  . Portacath placement  04/13/2011    Procedure: INSERTION PORT-A-CATH;  Surgeon: Haywood Lasso, MD;  Location: [facility];  Service: General;  Laterality: N/A;  porta cath placement,removal of J-P drain  . Mastectomy  03/23/2011    left  . Drain seroma  05/01/11    Chronic seroma $RemoveBefore'@mastectomy'EQORBHIbQXhBu$  site  . Evacuation breast hematoma  07/01/2011    Procedure: EVACUATION HEMATOMA BREAST;  Surgeon: Haywood Lasso, MD;  Location: [facility];  Service: General;  Laterality: Left;  Drainage of left mastectomy seroma  . Breast surgery    . Port-a-cath removal  12/02/2011    Procedure: REMOVAL PORT-A-CATH;  Surgeon: Haywood Lasso, MD;  Location: [facility];  Service: General;  Laterality: N/A;   . Outpatient Encounter Prescriptions as of 02/23/2013  Medication Sig  . acyclovir (ZOVIRAX) 200 MG capsule TAKE TWO CAPSULES BY MOUTH THREE TIMES DAILY  . Alcohol Swabs PADS Use once daily to check blood  glucose as directed  . ALPRAZolam (XANAX) 0.5 MG tablet TAKE ONE-HALF TO ONE TABLET BY MOUTH THREE TIMES DAILY AS NEEDED FOR  NERVES  . amLODipine (NORVASC) 10 MG tablet Take 1 tablet (10 mg total) by mouth daily.  Marland Kitchen atorvastatin (LIPITOR) 40 MG tablet Take 1 tablet (40 mg total) by mouth daily.  . Blood Glucose Monitoring Suppl (EASY TALK BLOOD GLUCOSE SYSTEM) DEVI Use once daily as directed to check blood glucose  . EASY TALK BLOOD GLUCOSE TEST test strip Use to check blood glucose once daily as directed  . Insulin Glargine 100 UNIT/ML SOPN Inject 55 Units into the skin at bedtime.  . Insulin Lispro, Human, (HUMALOG KWIKPEN [place]) 3 times a day (just before each meal) 25-5-25 units  . Insulin Pen Needle (RELION PEN NEEDLE 31G/8MM) 31G X 8 MM MISC 1 Device by Does not apply route 4 (four) times daily.  Marland Kitchen letrozole (FEMARA) 2.5 MG tablet TAKE ONE TABLET BY MOUTH DAILY.  Marland Kitchen losartan (COZAAR) 100 MG tablet Take 1 tablet (100 mg total) by mouth daily.  Marland Kitchen omeprazole (PRILOSEC) 20 MG capsule Take 1 capsule (20 mg total) by mouth daily.  . sertraline (ZOLOFT) 50 MG tablet Take 1 tablet (50 mg total) by mouth daily.  . [DISCONTINUED] hydrochlorothiazide (HYDRODIURIL) 25 MG tablet Take 1 tablet (25 mg total) by mouth daily as needed.  . [  DISCONTINUED] Hydrocodone-Acetaminophen 5-300 MG TABS   . [DISCONTINUED] levofloxacin (LEVAQUIN) 500 MG tablet Take 1 tablet (500 mg total) by mouth daily.  . [DISCONTINUED] naproxen (NAPROSYN) 500 MG tablet   . [DISCONTINUED] UNABLE TO FIND RX: [id number]- Silicone Breast prosthesis Replacement,left. DX: 174.9; Left Breast Mastectomy due to left breast cancer Quantity: 1    Allergies  Allergen Reactions  . Cephalexin     REACTION: hives  . Clindamycin     REACTION: hives  . Dilaudid [Hydromorphone Hcl] Itching  . Oxycodone-Acetaminophen     REACTION: hives  . Rocephin [Ceftriaxone Sodium In Dextrose] Hives  . Sulfonamide Derivatives     REACTION: hives    Current  Medications, Allergies, Past Medical History, Past Surgical History, Family History, and Social History were reviewed in [employer] record.    Review of Systems         The patient complains of dyspnea on exertion, peripheral edema, gas/bloating, indigestion/heartburn, joint pain, muscle cramps, arthritis, itching, and dryness.  The patient denies fever, chills, sweats, anorexia, fatigue, weakness, malaise, weight loss, sleep disorder, blurring, diplopia, eye irritation, eye discharge, vision loss, eye pain, photophobia, earache, ear discharge, tinnitus, decreased hearing, nasal congestion, nosebleeds, sore throat, hoarseness, chest pain, palpitations, syncope, orthopnea, PND, cough, dyspnea at rest, excessive sputum, hemoptysis, wheezing, pleurisy, nausea, vomiting, diarrhea, constipation, change in bowel habits, abdominal pain, melena, hematochezia, jaundice, dysphagia, odynophagia, dysuria, hematuria, urinary frequency, urinary hesitancy, nocturia, incontinence, back pain, joint swelling, muscle weakness, stiffness, sciatica, restless legs, leg pain at night, leg pain with exertion, rash, suspicious lesions, paralysis, paresthesias, seizures, tremors, vertigo, transient blindness, frequent falls, frequent headaches, difficulty walking, depression, anxiety, memory loss, confusion, cold intolerance, heat intolerance, polydipsia, polyphagia, polyuria, unusual weight change, abnormal bruising, bleeding, enlarged lymph nodes, urticaria, allergic rash, hay fever, and recurrent infections.     Objective:   Physical Exam      WD, Overweight, 73 y/o WF in NAD... GENERAL:  Alert & oriented; pleasant & cooperative... she is sl anxious... HEENT:  [place]/AT, EOM-wnl, PERRLA, EACs-clear, TMs-wnl, NOSE-clear, THROAT-clear & wnl. NECK:  Supple w/ fairROM; no JVD; normal carotid impulses w/o bruits; no thyromegaly or nodules palpated; no lymphadenopathy. CHEST:  Clear to P & A; without wheezes/  rales/ or rhonchi. HEART:  Regular Rhythm; without murmurs/ rubs/ or gallops. ABDOMEN:  Obese, soft & nontender; normal bowel sounds; no organomegaly or masses detected. EXT: without deformities, mod arthritic changes; no varicose veins/ venous insuffic/ or edema. NEURO:  CN's intact; motor testing normal; sensory testing normal; gait normal & balance OK. DERM:  No lesions noted; no rash etc...  RADIOLOGY DATA:  Reviewed in the EPIC EMR & discussed w/ the patient...  LABORATORY DATA:  Reviewed in the EPIC EMR & discussed w/ the patient...   Assessment:     BREAST CANCER >> This has taken all her time & energy over the last 2 yrs, now stable on Femara and followed by [facility]...   AB>  She had hx recurrent URI/ bronchitis that was slow to clear & she required several antibiotics (Levaquin worked best), Pred, Mucinex, etc...  HBP>  Controlled on her 2 meds; we discussed diet, exercise, wt reduction & sodium restriction...  Hyperchol>  She has been on Lip40 w/ fair control & advised on diet & wt reduction...  DM>  She is followed by [name] on Lantus & Humalog now... A1c was 8.2 in Sept...  Obesity>  Her weight is unchanged at 198#; we reviewed diet, exercise, wt reduction  strategies...  GI> GERD, Colon Polyps>  She is follwed by [facility] but we don't have any notes from her; pt is encouraged to f/u there...  GU> Urinary Incont>  Followed by GYN & Urology...  DJD/ LBP>  Followed by [name] w/ bursitis surg to right hip 7/12...  Anxiety/ Depression>  On Zoloft50 & Alprazolam as needed...     Plan:     Patient's Medications  New Prescriptions   No medications on file  Previous Medications   ACYCLOVIR (ZOVIRAX) 200 MG CAPSULE    TAKE TWO CAPSULES BY MOUTH THREE TIMES DAILY   ALCOHOL SWABS PADS    Use once daily to check blood glucose as directed   ALPRAZOLAM (XANAX) 0.5 MG TABLET    TAKE ONE-HALF TO ONE TABLET BY MOUTH THREE TIMES DAILY AS NEEDED FOR  NERVES   AMLODIPINE  (NORVASC) 10 MG TABLET    Take 1 tablet (10 mg total) by mouth daily.   ATORVASTATIN (LIPITOR) 40 MG TABLET    Take 1 tablet (40 mg total) by mouth daily.   BLOOD GLUCOSE MONITORING SUPPL (EASY TALK BLOOD GLUCOSE SYSTEM) DEVI    Use once daily as directed to check blood glucose   EASY TALK BLOOD GLUCOSE TEST TEST STRIP    Use to check blood glucose once daily as directed   INSULIN GLARGINE 100 UNIT/ML SOPN    Inject 55 Units into the skin at bedtime.   INSULIN LISPRO, HUMAN, (HUMALOG KWIKPEN [place])    3 times a day (just before each meal) 25-5-25 units   INSULIN PEN NEEDLE (RELION PEN NEEDLE 31G/8MM) 31G X 8 MM MISC    1 Device by Does not apply route 4 (four) times daily.   LETROZOLE (FEMARA) 2.5 MG TABLET    TAKE ONE TABLET BY MOUTH DAILY.   LOSARTAN (COZAAR) 100 MG TABLET    Take 1 tablet (100 mg total) by mouth daily.   OMEPRAZOLE (PRILOSEC) 20 MG CAPSULE    Take 1 capsule (20 mg total) by mouth daily.   SERTRALINE (ZOLOFT) 50 MG TABLET    Take 1 tablet (50 mg total) by mouth daily.  Modified Medications   No medications on file  Discontinued Medications   HYDROCHLOROTHIAZIDE (HYDRODIURIL) 25 MG TABLET    Take 1 tablet (25 mg total) by mouth daily as needed.   HYDROCODONE-ACETAMINOPHEN 5-300 MG TABS       LEVOFLOXACIN (LEVAQUIN) 500 MG TABLET    Take 1 tablet (500 mg total) by mouth daily.   NAPROXEN (NAPROSYN) 500 MG TABLET       UNABLE TO FIND    RX: [id number]- Silicone Breast prosthesis Replacement,left. DX: 174.9; Left Breast Mastectomy due to left breast cancer Quantity: 1

## 2013-02-23 NOTE — Patient Instructions (Signed)
Today we updated your med list in our EPIC system...    Continue your current medications the same...  We refilled your meds per request...  Remember to add a women's formula mutivit daily AND     Vit B12 ~571mcg/d and VitD ~1000u per day...  Call for any questions.Marland KitchenMarland Kitchen

## 2013-02-24 ENCOUNTER — Telehealth: Payer: Self-pay | Admitting: Pulmonary Disease

## 2013-02-24 ENCOUNTER — Ambulatory Visit: Payer: Medicare Other | Admitting: Endocrinology

## 2013-02-24 NOTE — Telephone Encounter (Signed)
Please advise if you have fax on pt? thanks

## 2013-02-24 NOTE — Telephone Encounter (Signed)
i have received this fax and spoke with pt about getting her supplies through this company but she wanted to see if she could get these supplies from her mail order pharmacy.  Pt will call me back once she has decided where she wants to get her supplies from.

## 2013-02-27 ENCOUNTER — Telehealth: Payer: Self-pay | Admitting: Pulmonary Disease

## 2013-02-27 DIAGNOSIS — H3530 Unspecified macular degeneration: Secondary | ICD-10-CM

## 2013-02-27 NOTE — Telephone Encounter (Signed)
I called and spoke with pt. She has [employer]. She went to her eye doctor Friday and is wanting to send her to a specialists Dr. Zigmund Daniel for ecal for macular degeneration. Well since she has [employer] they are requesting a referral to Dr. Zigmund Daniel from pt PCP. Please advise thanks

## 2013-02-27 NOTE — Telephone Encounter (Signed)
Per SN---  Ok for the referral to see Dr. Zigmund Daniel to eval for macular degeneration.  Called and spoke with pt and she is aware. Nothing further is needed.

## 2013-03-02 ENCOUNTER — Ambulatory Visit (INDEPENDENT_AMBULATORY_CARE_PROVIDER_SITE_OTHER): Payer: Medicare HMO | Admitting: Endocrinology

## 2013-03-02 ENCOUNTER — Encounter: Payer: Self-pay | Admitting: Endocrinology

## 2013-03-02 VITALS — BP 114/60 | HR 88 | Temp 97.9°F | Ht 68.0 in | Wt 200.0 lb

## 2013-03-02 DIAGNOSIS — H3530 Unspecified macular degeneration: Secondary | ICD-10-CM

## 2013-03-02 DIAGNOSIS — E119 Type 2 diabetes mellitus without complications: Secondary | ICD-10-CM

## 2013-03-02 LAB — HEMOGLOBIN A1C: HEMOGLOBIN A1C: 8.2 % — AB (ref 4.6–6.5)

## 2013-03-02 MED ORDER — INSULIN LISPRO 100 UNIT/ML (KWIKPEN): PEN_INJECTOR | SUBCUTANEOUS | Status: DC

## 2013-03-02 MED ORDER — INSULIN GLARGINE 100 UNIT/ML SOLOSTAR PEN: 55.0000 [IU] | PEN_INJECTOR | Freq: Every day | SUBCUTANEOUS | Status: DC

## 2013-03-02 NOTE — Progress Notes (Signed)
Subjective:    Patient ID: [name], female    DOB: 1940-06-05, 73 y.o.   MRN: [id number]  HPI Pt returns for f/u of type 2 DM (dx'ed 2007; she has mild if any neuropathy of the lower extremities; no known associated complications; she has never had severe hypoglycemia or DKA; she has finished XRT, and chemotx; she takes multiple daily injections).  no cbg record, but states cbg's are lowest in am, and highest in the afternoon (high-100's).  pt states she feels well in general. Past Medical History  Diagnosis Date  . DIABETES MELLITUS 08/10/2007  . 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 HYPERCHOLESTEROLEMIA 12/17/2006  . OBESITY 06/04/2008  . ANXIETY 06/04/2008  . DEPRESSION 08/10/2007  . HYPERTENSION 12/17/2006  . ASTHMATIC BRONCHITIS, ACUTE 06/04/2008  . ALLERGIC RHINITIS 12/13/2007  . GERD 12/17/2006  . DEGENERATIVE JOINT DISEASE 12/17/2006  . Asthma   . Blood transfusion 1964    post childbirth  . 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 History of chemotherapy   . History of radiation therapy 10/07/11-11/24/11    left breast,5040 cGy/28 sessions,l chest wall boost=1000cGy/5 sesions    Past Surgical History  Procedure Laterality Date  . Laminectomy  1993    s/p-Dr. Rita Ohara  . Bladder tack  1974  . Cholecystectomy  02/23/1991    LC - Dr Margot Chimes  . Tonsillectomy    . Rotator cuff repair  8/03    right, Dr. Tonita Cong  . Enterocele repair  06/08    Dr. Cletis Media  . Rotator cuff repair  10/26/2008    left, Dr. Rush Farmer  . Hip surgery  08/27/2010    DR. Maureen Ralphs  . Breast excisional biopsy  11/10/1988    left benign Dr Margot Chimes  . Breast excisional biopsy  03/04/1983    Right - two areas - Dr Margot Chimes  . Breast excisional biopsy  10/09/1980    right - Dr Margot Chimes  . Breast excisional biopsy  10/18/1979    left - Dr Margot Chimes  . Breast excisional biopsy  01/02/1994    right - Dr Margot Chimes  . Vaginal hysterectomy  1974    partial   . Colonoscopy    . Modified  radical mastectomy w/ axillary lymph node dissection      03-30-11 LT  . Portacath placement  04/13/2011    Procedure: INSERTION PORT-A-CATH;  Surgeon: Haywood Lasso, MD;  Location: [place];  Service: General;  Laterality: N/A;  porta cath placement,removal of J-P drain  . Mastectomy  03/23/2011    left  . Drain seroma  05/01/11    Chronic seroma @mastectomy  site  . Evacuation breast hematoma  07/01/2011    Procedure: EVACUATION HEMATOMA BREAST;  Surgeon: Haywood Lasso, MD;  Location: [facility];  Service: General;  Laterality: Left;  Drainage of left mastectomy seroma  . Breast surgery    . Port-a-cath removal  12/02/2011    Procedure: REMOVAL PORT-A-CATH;  Surgeon: Haywood Lasso, MD;  Location: [place];  Service: General;  Laterality: N/A;    History   Social History  . Marital Status: Married    Spouse Name: N/A    Number of Children: 3  . Years of Education: N/A   Occupational History  .     Social History Main Topics  . Smoking status: Never Smoker   . Smokeless tobacco: Never Used  . Alcohol Use: No  .  Drug Use: No  . Sexual Activity: Yes   Other Topics Concern  . 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 acyclovir (ZOVIRAX) 200 MG capsule TAKE TWO CAPSULES BY MOUTH THREE TIMES DAILY  360 capsule  3  . Alcohol Swabs PADS Use once daily to check blood glucose as directed      . ALPRAZolam (XANAX) 0.5 MG tablet TAKE ONE-HALF TO ONE TABLET BY MOUTH THREE TIMES DAILY AS NEEDED FOR  NERVES  270 tablet  1  . amLODipine (NORVASC) 10 MG tablet Take 1 tablet (10 mg total) by mouth daily.  90 tablet  3  . atorvastatin (LIPITOR) 40 MG tablet Take 1 tablet (40 mg total) by mouth  daily.  90 tablet  3  . Blood Glucose Monitoring Suppl (EASY TALK BLOOD GLUCOSE SYSTEM) DEVI Use once daily as directed to check blood glucose      . cholecalciferol (VITAMIN D) 1000 UNITS tablet Take 1 tablet (1,000 Units total) by mouth daily.  90 tablet  3  . cyanocobalamin 500 MCG tablet Take 1 tablet (500 mcg total) by mouth daily.  90 tablet  3  . EASY TALK BLOOD GLUCOSE TEST test strip Use to check blood glucose once daily as directed      . Insulin Pen Needle (RELION PEN NEEDLE 31G/8MM) 31G X 8 MM MISC 1 Device by Does not apply route 4 (four) times daily.  120 each  11  . letrozole (FEMARA) 2.5 MG tablet TAKE ONE TABLET BY MOUTH DAILY.  90 tablet  12  . losartan (COZAAR) 100 MG tablet Take 1 tablet (100 mg total) by mouth daily.  90 tablet  3  . Multiple Vitamins-Minerals (ONE-A-DAY WOMENS 50+ ADVANTAGE) TABS Take 1 tablet by mouth daily.  90 tablet  3  . omeprazole (PRILOSEC) 20 MG capsule Take 1 capsule (20 mg total) by mouth daily.  90 capsule  3  . sertraline (ZOLOFT) 50 MG tablet Take 1 tablet (50 mg total) by mouth daily.  90 tablet  3  . [DISCONTINUED] prochlorperazine (COMPAZINE) 10 MG tablet Take 1 tablet (10 mg total) by mouth every 6 (six) hours as needed (Nausea or vomiting).  30 tablet  1   No current facility-administered medications on file prior to visit.    Allergies  Allergen Reactions  . Cephalexin     REACTION: hives  . Clindamycin     REACTION: hives  . Dilaudid [Hydromorphone Hcl] Itching  . Oxycodone-Acetaminophen     REACTION: hives  . Rocephin [Ceftriaxone Sodium In Dextrose] Hives  . Sulfonamide Derivatives     REACTION: hives    Family History  Problem Relation Age of Onset  . COPD Mother   . Arthritis Mother   . Emphysema Mother   . Mental illness Paternal Grandfather   . Heart disease Other     Sibling  . Diabetes Other     Sibling   . Diabetes Brother   . Cancer Brother     prostate  . Cancer Father     malignant brain tumor    BP  114/60  Pulse 88  Temp(Src) 97.9 F (36.6 C) (Oral)  Ht 5\' 8"  (1.727 m)  Wt 200 lb (90.719 kg)  BMI 30.42 kg/m2  SpO2 92%  Review of Systems denies hypoglycemia and weight change.      Objective:   Physical Exam VITAL SIGNS:  See vs page GENERAL: no distress   Lab Results  Component Value Date   HGBA1C 8.2* 03/02/2013      Assessment & Plan:  DM: This insulin regimen was chosen from multiple options, as it best matches her insulin to her changing requirements throughout the day.  The benefits of glycemic control must be weighed against the risks of hypoglycemia.  She needs increased rx. Breast cancer: this has limited exercise rx of DM, but she is finished chemotx.

## 2013-03-02 NOTE — Patient Instructions (Signed)
check your blood sugar 2 times a day.  vary the time of day when you check, between before the 3 meals, after meals, and at bedtime.  also check if you have symptoms of your blood sugar being too high or too low.  please keep a record of the readings and bring it to your next appointment here.  please call us sooner if your blood sugar goes below 70, or if it stays over 200.   blood tests are being requested for you today.  We'll contact you with results.    Please come back for a follow-up appointment in 3 months.

## 2013-03-03 ENCOUNTER — Ambulatory Visit (HOSPITAL_BASED_OUTPATIENT_CLINIC_OR_DEPARTMENT_OTHER): Payer: Commercial Managed Care - HMO | Admitting: Oncology

## 2013-03-03 ENCOUNTER — Encounter: Payer: Self-pay | Admitting: Oncology

## 2013-03-03 ENCOUNTER — Other Ambulatory Visit (HOSPITAL_BASED_OUTPATIENT_CLINIC_OR_DEPARTMENT_OTHER): Payer: Medicare HMO

## 2013-03-03 VITALS — BP 158/74 | HR 101 | Temp 97.9°F | Resp 18 | Ht 68.0 in | Wt 200.8 lb

## 2013-03-03 DIAGNOSIS — C50312 Malignant neoplasm of lower-inner quadrant of left female breast: Secondary | ICD-10-CM

## 2013-03-03 DIAGNOSIS — Z170 Estrogen receptor positive status [ER+]: Secondary | ICD-10-CM

## 2013-03-03 DIAGNOSIS — C50519 Malignant neoplasm of lower-outer quadrant of unspecified female breast: Secondary | ICD-10-CM

## 2013-03-03 DIAGNOSIS — C773 Secondary and unspecified malignant neoplasm of axilla and upper limb lymph nodes: Secondary | ICD-10-CM

## 2013-03-03 DIAGNOSIS — M8580 Other specified disorders of bone density and structure, unspecified site: Secondary | ICD-10-CM

## 2013-03-03 DIAGNOSIS — C50319 Malignant neoplasm of lower-inner quadrant of unspecified female breast: Secondary | ICD-10-CM

## 2013-03-03 LAB — CBC WITH DIFFERENTIAL/PLATELET
BASO%: 0.8 % (ref 0.0–2.0)
BASOS ABS: 0.1 10*3/uL (ref 0.0–0.1)
EOS%: 2 % (ref 0.0–7.0)
Eosinophils Absolute: 0.1 10*3/uL (ref 0.0–0.5)
HCT: 44 % (ref 34.8–46.6)
HEMOGLOBIN: 14.7 g/dL (ref 11.6–15.9)
LYMPH#: 1.9 10*3/uL (ref 0.9–3.3)
LYMPH%: 26.1 % (ref 14.0–49.7)
MCH: 29.4 pg (ref 25.1–34.0)
MCHC: 33.4 g/dL (ref 31.5–36.0)
MCV: 88.1 fL (ref 79.5–101.0)
MONO#: 0.6 10*3/uL (ref 0.1–0.9)
MONO%: 8.4 % (ref 0.0–14.0)
NEUT%: 62.7 % (ref 38.4–76.8)
NEUTROS ABS: 4.6 10*3/uL (ref 1.5–6.5)
Platelets: 230 10*3/uL (ref 145–400)
RBC: 5 10*6/uL (ref 3.70–5.45)
RDW: 14.7 % — ABNORMAL HIGH (ref 11.2–14.5)
WBC: 7.3 10*3/uL (ref 3.9–10.3)

## 2013-03-03 LAB — COMPREHENSIVE METABOLIC PANEL (CC13)
ALBUMIN: 3.7 g/dL (ref 3.5–5.0)
ALT: 25 U/L (ref 0–55)
AST: 22 U/L (ref 5–34)
Alkaline Phosphatase: 141 U/L (ref 40–150)
Anion Gap: 10 mEq/L (ref 3–11)
BUN: 17 mg/dL (ref 7.0–26.0)
CALCIUM: 9.9 mg/dL (ref 8.4–10.4)
CHLORIDE: 105 meq/L (ref 98–109)
CO2: 28 mEq/L (ref 22–29)
Creatinine: 0.8 mg/dL (ref 0.6–1.1)
Glucose: 80 mg/dl (ref 70–140)
POTASSIUM: 3.8 meq/L (ref 3.5–5.1)
Sodium: 143 mEq/L (ref 136–145)
Total Bilirubin: 0.44 mg/dL (ref 0.20–1.20)
Total Protein: 7.1 g/dL (ref 6.4–8.3)

## 2013-03-03 NOTE — Telephone Encounter (Signed)
appts made and printed. gv appt for [facility]...td 

## 2013-03-03 NOTE — Progress Notes (Signed)
[place]  Telephone:([phone]) [phone] Fax:([phone]) [phone]   OFFICE PROGRESS NOTE   Cc:  Noralee Space, MD [facility] [place] [address] [place] [address]  Dr. Hildred Alamin  Dr. Arloa Koh  Dr. Renato Shin   DIAGNOSIS: 73 year old female with stage III a invasive lobular carcinoma of the left breast status post mastectomy with left axillary lymph node dissection performed on 03/23/2011.   PRIOR THERAPY:  #1 patient was originally seen by me on 03/05/2011 for a invasive lobular carcinoma of the left breast the tumor was 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 Her clinical staging was stage IIB (T3, N0, M0).   #2 the patient has gone on to have a left breast mastectomy with axillary lymph node dissection on 03/23/2011. The final pathology revealed a 6.0 cm invasive lobular carcinoma with lymphovascular invasion and perineural invasion. Invasive tumor was 2.9 cm from the nearest margin lobular carcinoma in situ was noted. Initially 6 lymph nodes were positive for metastatic carcinoma. Tumor deposits were 0.5 0.8 0.7 0.7 0.3 and 1.0 cm focal extracapsular tumor extension was noted. Tumor was grade 3. The final lymph node count was 16 9 of which were positive for metastatic disease. Tumor was estrogen receptor +100% progesterone receptor +100% HER-2/neu negative with a ratio 1.31 proliferation marker 18% final pathologic staging was p T3a pN2a, Stage IIIa.   #3.patient is status post 4 cycles of FEC combination chemotherapy starting from 04/17/2011 may 2013. Her course was complicated by development of febrile neutropenia sepsis pancytopenia and mastectomy site cellulitis and chest wall abscess requiring incision and drainage.   #4 patient had radiation to the L axilla and L chest wall under the direction of Dr. Valere Dross from 10/07/2011 to 11/24/2011 as follows:Left chest wall and regional lymph nodes 5040 cGy 28  sessions left mastectomy scar/chest wall boost 1000 cGy 5 sessions   #5 Port-A-Cath removal on 12/02/2011   CURRENT THERAPY:  letrozole 2.5 mg daily adjuvantly since 10/16/11  [name] 73 y.o. female returns for a follow up appointment. Overall, she has been tolerating Femara therapy well. She denies any  bone pain,  myalgia, hot flashes, vaginal discharge or increased fatigue . Denies nausea or headache. She denies any chest wall pain, other than chronic radiation tenderness. Remainder of the 10 point review of systems is negative   Past Medical History  Diagnosis Date  . DIABETES MELLITUS 08/10/2007  . 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 HYPERCHOLESTEROLEMIA 12/17/2006  . OBESITY 06/04/2008  . ANXIETY 06/04/2008  . DEPRESSION 08/10/2007  . HYPERTENSION 12/17/2006  . ASTHMATIC BRONCHITIS, ACUTE 06/04/2008  . ALLERGIC RHINITIS 12/13/2007  . GERD 12/17/2006  . DEGENERATIVE JOINT DISEASE 12/17/2006  . Asthma   . Blood transfusion 1964    post childbirth  . 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 History of chemotherapy   . History of radiation therapy 10/07/11-11/24/11    left breast,5040 cGy/28 sessions,l chest wall boost=1000cGy/5 sesions    Past Surgical History  Procedure Laterality Date  . Laminectomy  1993    s/p-Dr. Rita Ohara  . Bladder tack  1974  . Cholecystectomy  02/23/1991    LC - Dr Margot Chimes  . Tonsillectomy    . Rotator cuff repair  8/03    right, Dr. Tonita Cong  . Enterocele repair  06/08    Dr. Cletis Media  . Rotator cuff repair  10/26/2008  left, Dr. Rush Farmer  . Hip surgery  08/27/2010    DR. Maureen Ralphs  . Breast excisional biopsy  11/10/1988    left benign Dr Margot Chimes  . Breast excisional biopsy  03/04/1983    Right - two areas - Dr Margot Chimes  . Breast excisional biopsy  10/09/1980    right - Dr Margot Chimes  . Breast excisional biopsy  10/18/1979    left - Dr Margot Chimes  . Breast excisional biopsy  01/02/1994    right - Dr Margot Chimes  . Vaginal  hysterectomy  1974    partial   . Colonoscopy    . Modified radical mastectomy w/ axillary lymph node dissection      03-30-11 LT  . Portacath placement  04/13/2011    Procedure: INSERTION PORT-A-CATH;  Surgeon: Haywood Lasso, MD;  Location: [place];  Service: General;  Laterality: N/A;  porta cath placement,removal of J-P drain  . Mastectomy  03/23/2011    left  . Drain seroma  05/01/11    Chronic seroma $RemoveBefore'@mastectomy'[id number]$  site  . Evacuation breast hematoma  07/01/2011    Procedure: EVACUATION HEMATOMA BREAST;  Surgeon: Haywood Lasso, MD;  Location: [facility];  Service: General;  Laterality: Left;  Drainage of left mastectomy seroma  . Breast surgery    . Port-a-cath removal  12/02/2011    Procedure: REMOVAL PORT-A-CATH;  Surgeon: Haywood Lasso, MD;  Location: [place];  Service: General;  Laterality: N/A;    Current Outpatient Prescriptions  Medication Sig Dispense Refill  . acyclovir (ZOVIRAX) 200 MG capsule TAKE TWO CAPSULES BY MOUTH THREE TIMES DAILY  360 capsule  3  . Alcohol Swabs PADS Use once daily to check blood glucose as directed      . ALPRAZolam (XANAX) 0.5 MG tablet TAKE ONE-HALF TO ONE TABLET BY MOUTH THREE TIMES DAILY AS NEEDED FOR  NERVES  270 tablet  1  . amLODipine (NORVASC) 10 MG tablet Take 1 tablet (10 mg total) by mouth daily.  90 tablet  3  . atorvastatin (LIPITOR) 40 MG tablet Take 1 tablet (40 mg total) by mouth daily.  90 tablet  3  . Blood Glucose Monitoring Suppl (EASY TALK BLOOD GLUCOSE SYSTEM) DEVI Use once daily as directed to check blood glucose      . cholecalciferol (VITAMIN D) 1000 UNITS tablet Take 1 tablet (1,000 Units total) by mouth daily.  90 tablet  3  . cyanocobalamin 500 MCG tablet Take 1 tablet (500 mcg total) by mouth daily.  90 tablet  3  . EASY TALK BLOOD GLUCOSE TEST test strip Use to check blood glucose once daily as directed      . Insulin Glargine (LANTUS) 100 UNIT/ML Solostar Pen Inject 55 Units  into the skin at bedtime.  60 mL  3  . insulin lispro (HUMALOG) 100 UNIT/ML KiwkPen 3 times a day (just before each meal) 60-15-60 units, and pen needles 4/day      . Insulin Pen Needle (RELION PEN NEEDLE 31G/8MM) 31G X 8 MM MISC 1 Device by Does not apply route 4 (four) times daily.  120 each  11  . letrozole (FEMARA) 2.5 MG tablet TAKE ONE TABLET BY MOUTH DAILY.  90 tablet  12  . losartan (COZAAR) 100 MG tablet Take 1 tablet (100 mg total) by mouth daily.  90 tablet  3  . Multiple Vitamins-Minerals (ONE-A-DAY WOMENS 50+ ADVANTAGE) TABS Take 1 tablet by mouth daily.  90 tablet  3  . omeprazole (  PRILOSEC) 20 MG capsule Take 1 capsule (20 mg total) by mouth daily.  90 capsule  3  . sertraline (ZOLOFT) 50 MG tablet Take 1 tablet (50 mg total) by mouth daily.  90 tablet  3  . [DISCONTINUED] prochlorperazine (COMPAZINE) 10 MG tablet Take 1 tablet (10 mg total) by mouth every 6 (six) hours as needed (Nausea or vomiting).  30 tablet  1   No current facility-administered medications for this visit.    ALLERGIES:  is allergic to cephalexin; clindamycin; dilaudid; oxycodone-acetaminophen; rocephin; and sulfonamide derivatives.  REVIEW OF SYSTEMS:  No respiratory or cardiac complaints.  The rest of the 14-point review of system was negative.   Filed Vitals:   03/03/13 1100  BP: 158/74  Pulse: 101  Temp: 97.9 F (36.6 C)  Resp: 18   Wt Readings from Last 3 Encounters:  03/03/13 200 lb 12.8 oz (91.082 kg)  03/02/13 200 lb (90.719 kg)  02/23/13 199 lb (90.266 kg)     PHYSICAL EXAMINATION:  BP 158/74  Pulse 101  Temp(Src) 97.9 F (36.6 C) (Oral)  Resp 18  Ht $R'5\' 8"'hU$  (1.727 m)  Wt 200 lb 12.8 oz (91.082 kg)  BMI 30.54 kg/m2 GENERAL: Well developed, well nourished, in no acute distress.  EENT: No ocular or oral lesions. No stomatitis.  RESPIRATORY: Lungs are clear to auscultation bilaterally  CARDIAC: No murmur, rub or tachycardia. No upper or lower extremity edema.  GI: Abdomen is soft,  no palpable hepatosplenomegaly. No fluid wave. No tenderness. Musculoskeletal: No kyphosis, no tenderness over the spine, ribs or hips. Lymph: No cervical, infraclavicular, axillary or inguinal adenopathy. Neuro: No focal neurological deficits. Psych: Alert and oriented X 3, appropriate mood and affect.  BREAST EXAM:Left mastectomy site is well healed, with some tenderness at the radiation regions with residual hyperpigmentation the skin  without any visible new masses. R breast without any masses. No axillary lymphadenopathy.    LABORATORY/RADIOLOGY DATA:  Lab Results  Component Value Date   WBC 7.3 03/03/2013   HGB 14.7 03/03/2013   HCT 44.0 03/03/2013   PLT 230 03/03/2013   GLUCOSE 80 03/03/2013   CHOL 188 08/26/2012   TRIG 123 08/26/2012   HDL 52 08/26/2012   LDLDIRECT 212.7 02/06/2011   LDLCALC 111* 08/26/2012   ALKPHOS 141 03/03/2013   ALT 25 03/03/2013   AST 22 03/03/2013   NA 143 03/03/2013   K 3.8 03/03/2013   CL 102 02/25/2012   CREATININE 0.8 03/03/2013   BUN 17.0 03/03/2013   CO2 28 03/03/2013   INR 0.99 08/20/2010   HGBA1C 8.2* 03/02/2013   MICROALBUR 42.2* 04/06/2012    No results found.     ASSESSMENT/PLAN:    [name] 73 y.o. female with   #1 invasive lobular carcinoma of the left breast status post mastectomy with axillary lymph node dissection. The final pathology revealed a T3 N2 way invasive lobular carcinoma. The tumor measured 6.0 cm 9 of 16 lymph nodes were positive for metastatic disease. Tumor was ER positive PR positive HER-2/neu negative proliferation marker 18%.   #2 patient is now status post 4 cycles of combination chemotherapy consisting of FEC. Her last treatment was given on May 2013. She did require recurrent hospitalizations throughout this treatment. She's had recurrent infections most recently chest wall [place]. She did become significantly pancytopenic.   #3 Antiestrogen therapy for her estrogen receptor positive tumor in the form of  letrozole  2.5 mg daily. She is tolerating it well no evidence of  recurrent disease  #4 patient has completed  radiation therapy on 11/24/2011     Marcy Panning, MD Medical/Oncology [facility] [phone] (beeper) [phone] (Office)

## 2013-03-08 ENCOUNTER — Other Ambulatory Visit: Payer: Self-pay | Admitting: *Deleted

## 2013-03-08 ENCOUNTER — Telehealth: Payer: Self-pay | Admitting: Pulmonary Disease

## 2013-03-08 MED ORDER — INSULIN PEN NEEDLE 31G X 8 MM MISC: Status: DC

## 2013-03-08 NOTE — Telephone Encounter (Signed)
Referral form faxed to [facility] for [employer] pt aware Joellen [place]

## 2013-03-08 NOTE — Telephone Encounter (Signed)
Pt is calling back to check on the status of this.  Pt states her appt is scheduled for tomorrow morning at 9:00 AM.  Would like an update asap.  [name]

## 2013-03-08 NOTE — Telephone Encounter (Signed)
[name] of Dr. Tempie Hoist office called regarding pt's [facility] referral.  Pt has an appt tomorrow which was scheduled by Dr. Einar Gip.  Dr. West Pugh office can be reached at [phone].  [name]

## 2013-03-09 ENCOUNTER — Telehealth: Payer: Self-pay | Admitting: Pulmonary Disease

## 2013-03-09 ENCOUNTER — Encounter (INDEPENDENT_AMBULATORY_CARE_PROVIDER_SITE_OTHER): Payer: Self-pay | Admitting: Ophthalmology

## 2013-03-10 ENCOUNTER — Encounter (INDEPENDENT_AMBULATORY_CARE_PROVIDER_SITE_OTHER): Payer: Medicare HMO | Admitting: Ophthalmology

## 2013-03-10 DIAGNOSIS — E11319 Type 2 diabetes mellitus with unspecified diabetic retinopathy without macular edema: Secondary | ICD-10-CM

## 2013-03-10 DIAGNOSIS — E1165 Type 2 diabetes mellitus with hyperglycemia: Secondary | ICD-10-CM

## 2013-03-10 DIAGNOSIS — H43819 Vitreous degeneration, unspecified eye: Secondary | ICD-10-CM

## 2013-03-10 DIAGNOSIS — H35039 Hypertensive retinopathy, unspecified eye: Secondary | ICD-10-CM

## 2013-03-10 DIAGNOSIS — I10 Essential (primary) hypertension: Secondary | ICD-10-CM

## 2013-03-10 DIAGNOSIS — E1139 Type 2 diabetes mellitus with other diabetic ophthalmic complication: Secondary | ICD-10-CM

## 2013-03-10 DIAGNOSIS — H2510 Age-related nuclear cataract, unspecified eye: Secondary | ICD-10-CM

## 2013-03-10 NOTE — Telephone Encounter (Signed)
Spoke to pt she is aware precert has been done [employer]

## 2013-03-15 DIAGNOSIS — Z853 Personal history of malignant neoplasm of breast: Secondary | ICD-10-CM | POA: Insufficient documentation

## 2013-03-15 DIAGNOSIS — E785 Hyperlipidemia, unspecified: Secondary | ICD-10-CM | POA: Insufficient documentation

## 2013-03-15 DIAGNOSIS — E119 Type 2 diabetes mellitus without complications: Secondary | ICD-10-CM | POA: Insufficient documentation

## 2013-03-15 DIAGNOSIS — N3946 Mixed incontinence: Secondary | ICD-10-CM | POA: Insufficient documentation

## 2013-03-15 DIAGNOSIS — G629 Polyneuropathy, unspecified: Secondary | ICD-10-CM | POA: Insufficient documentation

## 2013-03-15 DIAGNOSIS — Z794 Long term (current) use of insulin: Secondary | ICD-10-CM | POA: Insufficient documentation

## 2013-04-10 ENCOUNTER — Encounter: Payer: Self-pay | Admitting: Pulmonary Disease

## 2013-04-24 ENCOUNTER — Encounter: Payer: Self-pay | Admitting: Pulmonary Disease

## 2013-07-12 ENCOUNTER — Other Ambulatory Visit: Payer: Self-pay

## 2013-07-12 DIAGNOSIS — Z1231 Encounter for screening mammogram for malignant neoplasm of breast: Secondary | ICD-10-CM

## 2013-07-21 ENCOUNTER — Ambulatory Visit
Admission: RE | Admit: 2013-07-21 | Discharge: 2013-07-21 | Disposition: A | Discharge status: Home / Self Care | Payer: Medicare HMO | Source: Ambulatory Visit

## 2013-07-21 DIAGNOSIS — Z1231 Encounter for screening mammogram for malignant neoplasm of breast: Secondary | ICD-10-CM

## 2013-08-24 ENCOUNTER — Telehealth: Payer: Self-pay | Admitting: *Deleted

## 2013-08-24 NOTE — Telephone Encounter (Signed)
Notified pt of future appointments on 09/12/2013.

## 2013-08-30 ENCOUNTER — Telehealth: Payer: Self-pay

## 2013-08-30 NOTE — Telephone Encounter (Signed)
Diabetic Bundle. Lvom for pt to call back and set appointment up with Dr. Loanne Drilling.

## 2013-09-01 ENCOUNTER — Other Ambulatory Visit: Payer: Medicare HMO

## 2013-09-01 ENCOUNTER — Ambulatory Visit: Payer: Medicare HMO | Admitting: Oncology

## 2013-09-11 ENCOUNTER — Other Ambulatory Visit: Payer: Self-pay

## 2013-09-11 DIAGNOSIS — C50319 Malignant neoplasm of lower-inner quadrant of unspecified female breast: Secondary | ICD-10-CM

## 2013-09-12 ENCOUNTER — Ambulatory Visit (HOSPITAL_BASED_OUTPATIENT_CLINIC_OR_DEPARTMENT_OTHER): Payer: Medicare HMO | Admitting: Hematology and Oncology

## 2013-09-12 ENCOUNTER — Other Ambulatory Visit (HOSPITAL_BASED_OUTPATIENT_CLINIC_OR_DEPARTMENT_OTHER): Payer: Medicare HMO

## 2013-09-12 ENCOUNTER — Encounter: Payer: Self-pay | Admitting: Hematology and Oncology

## 2013-09-12 ENCOUNTER — Telehealth: Payer: Self-pay | Admitting: Hematology and Oncology

## 2013-09-12 VITALS — BP 124/74 | HR 89 | Temp 98.3°F | Resp 20 | Ht 68.0 in | Wt 203.5 lb

## 2013-09-12 DIAGNOSIS — M899 Disorder of bone, unspecified: Secondary | ICD-10-CM

## 2013-09-12 DIAGNOSIS — C773 Secondary and unspecified malignant neoplasm of axilla and upper limb lymph nodes: Secondary | ICD-10-CM

## 2013-09-12 DIAGNOSIS — C50319 Malignant neoplasm of lower-inner quadrant of unspecified female breast: Secondary | ICD-10-CM

## 2013-09-12 DIAGNOSIS — C50312 Malignant neoplasm of lower-inner quadrant of left female breast: Secondary | ICD-10-CM

## 2013-09-12 DIAGNOSIS — M949 Disorder of cartilage, unspecified: Secondary | ICD-10-CM

## 2013-09-12 DIAGNOSIS — C50519 Malignant neoplasm of lower-outer quadrant of unspecified female breast: Secondary | ICD-10-CM

## 2013-09-12 DIAGNOSIS — M8580 Other specified disorders of bone density and structure, unspecified site: Secondary | ICD-10-CM

## 2013-09-12 DIAGNOSIS — D61818 Other pancytopenia: Secondary | ICD-10-CM

## 2013-09-12 HISTORY — DX: Other specified disorders of bone density and structure, unspecified site: M85.80

## 2013-09-12 LAB — COMPREHENSIVE METABOLIC PANEL
ALBUMIN: 4 g/dL (ref 3.5–5.2)
ALT: 22 U/L (ref 0–35)
AST: 19 U/L (ref 0–37)
Alkaline Phosphatase: 121 U/L — ABNORMAL HIGH (ref 39–117)
BUN: 15 mg/dL (ref 6–23)
CO2: 29 mEq/L (ref 19–32)
Calcium: 9.5 mg/dL (ref 8.4–10.5)
Chloride: 99 mEq/L (ref 96–112)
Creatinine, Ser: 0.79 mg/dL (ref 0.50–1.10)
Glucose, Bld: 231 mg/dL — ABNORMAL HIGH (ref 70–99)
POTASSIUM: 4.3 meq/L (ref 3.5–5.3)
SODIUM: 137 meq/L (ref 135–145)
TOTAL PROTEIN: 6.6 g/dL (ref 6.0–8.3)
Total Bilirubin: 0.5 mg/dL (ref 0.2–1.2)

## 2013-09-12 LAB — CBC WITH DIFFERENTIAL/PLATELET
BASO%: 0.6 % (ref 0.0–2.0)
Basophils Absolute: 0 10*3/uL (ref 0.0–0.1)
EOS%: 3.2 % (ref 0.0–7.0)
Eosinophils Absolute: 0.2 10*3/uL (ref 0.0–0.5)
HCT: 42.5 % (ref 34.8–46.6)
HGB: 13.9 g/dL (ref 11.6–15.9)
LYMPH%: 25.5 % (ref 14.0–49.7)
MCH: 29 pg (ref 25.1–34.0)
MCHC: 32.7 g/dL (ref 31.5–36.0)
MCV: 88.5 fL (ref 79.5–101.0)
MONO#: 0.4 10*3/uL (ref 0.1–0.9)
MONO%: 7.5 % (ref 0.0–14.0)
NEUT#: 3.2 10*3/uL (ref 1.5–6.5)
NEUT%: 63.2 % (ref 38.4–76.8)
Platelets: 182 10*3/uL (ref 145–400)
RBC: 4.8 10*6/uL (ref 3.70–5.45)
RDW: 13.8 % (ref 11.2–14.5)
WBC: 5.1 10*3/uL (ref 3.9–10.3)
lymph#: 1.3 10*3/uL (ref 0.9–3.3)

## 2013-09-12 MED ORDER — IBANDRONATE SODIUM 150 MG PO TABS: 150.0000 mg | ORAL_TABLET | ORAL | Status: DC

## 2013-09-12 MED ORDER — CALCIUM CARBONATE-VITAMIN D 600-400 MG-UNIT PO TABS: 1.0000 | ORAL_TABLET | Freq: Two times a day (BID) | ORAL | Status: DC

## 2013-09-12 NOTE — Telephone Encounter (Signed)
per pof to sch pt appt-sch and gave pt copy of sch °

## 2013-09-12 NOTE — Assessment & Plan Note (Signed)
Patient is here for six-month followup she recent mammogram of the right wrist as well as DEXA scan and is here today to discuss the results and mammogram was completely normal without any abnormalities the DEXA scan showed T score of -2.1 suggestive of osteopenia. The breast exam did not reveal any abnormalities in the right breast and she will be seen by [place] in 6 months for another routine followup.  Left chest wall pain related to prior surgery and radiation therapy with scarring I believe that if her pain is persistent or getting worse we can consider sending her to a pain physician for nerve block.  Continue with the current treatment with aromatase inhibitor and return back to see [place] in 6

## 2013-09-12 NOTE — Assessment & Plan Note (Signed)
I recommended starting the patient on calcium plus vitamin D twice a day along with Boniva once a month I instructed her not to lie down after taking Boniva and to take it with a large glass of water. I also instructed her to discontinue Boniva if she were to have extractions for one to 2 months before and 1-2 months after to prevent osteonecrosis of the jaw the prescriptions were sent to [facility]

## 2013-09-12 NOTE — Progress Notes (Signed)
Patient Care Team: Noralee Space, MD as PCP - General (Pulmonary Disease) Haywood Lasso, MD as Surgeon (General Surgery)  DIAGNOSIS: Breast cancer, ILC, left, receptor +, her 2 neg   Primary site: Breast (Left)   Staging method: AJCC 7th Edition   Clinical: Stage IIB (T3, N0, cM0) signed by Haywood Lasso, MD on 03/05/2011  7:32 AM   Pathologic: Stage IIIA (T3, N2a, cM0) signed by Haywood Lasso, MD on 03/25/2011  5:41 PM   Summary: Stage IIIA (T3, N2a, cM0)   Prognostic indicators:      ER 100%      PR 100%      Ki67 18%      Her2 neg 1.31   SUMMARY OF ONCOLOGIC HISTORY:   Breast cancer, ILC, left, receptor +, her 2 neg   02/20/2011 Initial Diagnosis Left Breast invasive lobular carcinoma; 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 Her clinical staging was stage IIB (T3, N0, M0).     03/23/2011 Surgery L mastectomy + Axillary LND 6 cm ILC with LVI, PNI; 9/16 LN positive with Extracapsular ext ER 100%; PR 100%; Her 2 1.31; Ki 67: 18; pT3a N2a (stage IIIA)   04/17/2011 - 06/19/2011 Chemotherapy FEC X 4 complicated by febrile neutropenis, spesis and chest wall abscess   10/07/2011 - 11/24/2011 Radiation Therapy Left chest wall and axilla 5040 cGy in 28 # with Boost   10/16/2011 -  Anti-estrogen oral therapy Letrozole 2.5 mg po daily    CHIEF COMPLIANT:  Intermittent left chest wall discomfort and pain that can be sharp but very sharp lasting INTERVAL HISTORY:  [name] is a 73 year old [name] who is here for routine followup after undergoing breast mammogram on the right side along with the DEXA scan she does not have any major new complaints she continues to have intermittent left-sided chest wall discomfort that last for about 2 minutes and when it happens it is intense. She feels tight in the left chest wall and has some difficulty lifting her arm denies any other complaints she does have hot flashes related to antiestrogen therapy  but she is able to manage it without any major issues  REVIEW OF SYSTEMS:   Constitutional: Denies fevers, chills or abnormal weight loss Eyes: Denies blurriness of vision Ears, nose, mouth, throat, and face: Denies mucositis or sore throat Respiratory: Denies cough, dyspnea or wheezes Cardiovascular: Denies palpitation, chest discomfort or lower extremity swelling Gastrointestinal:  Denies nausea, heartburn or change in bowel habits Skin: Denies abnormal skin rashes Lymphatics: Denies new lymphadenopathy or easy bruising Neurological:Denies numbness, tingling or new weaknesses Behavioral/Psych: Mood is stable, no new changes  All other systems were reviewed with the patient and are negative.  I have reviewed the past medical history, past surgical history, social history and family history with the patient and they are unchanged from previous note.  ALLERGIES:  is allergic to cephalexin; clindamycin; dilaudid; oxycodone-acetaminophen; rocephin; and sulfonamide derivatives.  MEDICATIONS:  Current Outpatient Prescriptions  Medication Sig Dispense Refill  . acyclovir (ZOVIRAX) 200 MG capsule TAKE TWO CAPSULES BY MOUTH THREE TIMES DAILY  360 capsule  3  . ALPRAZolam (XANAX) 0.5 MG tablet TAKE ONE-HALF TO ONE TABLET BY MOUTH THREE TIMES DAILY AS NEEDED FOR  NERVES  270 tablet  1  . amLODipine (NORVASC) 10 MG tablet Take 1 tablet (10 mg total) by mouth daily.  90 tablet  3  . atorvastatin (LIPITOR) 40 MG tablet Take 1 tablet (40 mg total) by  mouth daily.  90 tablet  3  . Blood Glucose Monitoring Suppl (EASY TALK BLOOD GLUCOSE SYSTEM) DEVI Use once daily as directed to check blood glucose      . cholecalciferol (VITAMIN D) 1000 UNITS tablet Take 1 tablet (1,000 Units total) by mouth daily.  90 tablet  3  . cyanocobalamin 500 MCG tablet Take 1 tablet (500 mcg total) by mouth daily.  90 tablet  3  . EASY TALK BLOOD GLUCOSE TEST test strip Use to check blood glucose once daily as directed      .  Insulin Glargine (LANTUS) 100 UNIT/ML Solostar Pen Inject 55 Units into the skin at bedtime.  60 mL  3  . insulin lispro (HUMALOG) 100 UNIT/ML KiwkPen 3 times a day (just before each meal) 20-15-20 units, and pen needles 4/day      . Insulin Pen Needle (RELION PEN NEEDLE 31G/8MM) 31G X 8 MM MISC 1 Device by Does not apply route 4 (four) times daily.  120 each  11  . letrozole (FEMARA) 2.5 MG tablet TAKE ONE TABLET BY MOUTH DAILY.  90 tablet  12  . losartan (COZAAR) 100 MG tablet Take 1 tablet (100 mg total) by mouth daily.  90 tablet  3  . Multiple Vitamins-Minerals (ONE-A-DAY WOMENS 50+ ADVANTAGE) TABS Take 1 tablet by mouth daily.  90 tablet  3  . omeprazole (PRILOSEC) 20 MG capsule Take 1 capsule (20 mg total) by mouth daily.  90 capsule  3  . sertraline (ZOLOFT) 50 MG tablet Take 1 tablet (50 mg total) by mouth daily.  90 tablet  3  . tolterodine (DETROL LA) 2 MG 24 hr capsule Take 2 mg by mouth.      . Calcium Carbonate-Vitamin D (CALCIUM 600+D3) 600-400 MG-UNIT per tablet Take 1 tablet by mouth 2 (two) times daily.  180 tablet  3  . ibandronate (BONIVA) 150 MG tablet Take 1 tablet (150 mg total) by mouth every 30 (thirty) days. Take in the morning with a full glass of water, on an empty stomach, and do not take anything else by mouth or lie down for the next 30 min.  3 tablet  3  . [DISCONTINUED] prochlorperazine (COMPAZINE) 10 MG tablet Take 1 tablet (10 mg total) by mouth every 6 (six) hours as needed (Nausea or vomiting).  30 tablet  1   No current facility-administered medications for this visit.    PHYSICAL EXAMINATION: ECOG PERFORMANCE STATUS: 1 - Symptomatic but completely ambulatory  Filed Vitals:   09/12/13 1121  BP: 124/74  Pulse: 89  Temp: 98.3 F (36.8 C)  Resp: 20   Filed Weights   09/12/13 1121  Weight: 203 lb 8 oz (92.307 kg)    GENERAL:alert, no distress and comfortable SKIN: skin color, texture, turgor are normal, no rashes or significant lesions EYES: normal,  Conjunctiva are pink and non-injected, sclera clear OROPHARYNX:no exudate, no erythema and lips, buccal mucosa, and tongue normal  NECK: supple, thyroid normal size, non-tender, without nodularity LYMPH:  no palpable lymphadenopathy in the cervical, axillary or inguinal LUNGS: clear to auscultation and percussion with normal breathing effort HEART: regular rate & rhythm and no murmurs and no lower extremity edema ABDOMEN:abdomen soft, non-tender and normal bowel sounds Musculoskeletal:no cyanosis of digits and no clubbing  NEURO: alert & oriented x 3 with fluent speech, no focal motor/sensory deficits Breast exam no abnormalities noted in the right breast as well as axilla left chest wall skin and subcutis tissues are tight and  tense but otherwise no abnormalities noted    LABORATORY DATA:  I have reviewed the data as listed Appointment on 09/12/2013  Component Date Value Ref Range Status  . WBC 09/12/2013 5.1  3.9 - 10.3 10e3/uL Final  . NEUT# 09/12/2013 3.2  1.5 - 6.5 10e3/uL Final  . HGB 09/12/2013 13.9  11.6 - 15.9 g/dL Final  . HCT 09/12/2013 42.5  34.8 - 46.6 % Final  . Platelets 09/12/2013 182  145 - 400 10e3/uL Final  . MCV 09/12/2013 88.5  79.5 - 101.0 fL Final  . MCH 09/12/2013 29.0  25.1 - 34.0 pg Final  . MCHC 09/12/2013 32.7  31.5 - 36.0 g/dL Final  . RBC 09/12/2013 4.80  3.70 - 5.45 10e6/uL Final  . RDW 09/12/2013 13.8  11.2 - 14.5 % Final  . lymph# 09/12/2013 1.3  0.9 - 3.3 10e3/uL Final  . MONO# 09/12/2013 0.4  0.1 - 0.9 10e3/uL Final  . Eosinophils Absolute 09/12/2013 0.2  0.0 - 0.5 10e3/uL Final  . Basophils Absolute 09/12/2013 0.0  0.0 - 0.1 10e3/uL Final  . NEUT% 09/12/2013 63.2  38.4 - 76.8 % Final  . LYMPH% 09/12/2013 25.5  14.0 - 49.7 % Final  . MONO% 09/12/2013 7.5  0.0 - 14.0 % Final  . EOS% 09/12/2013 3.2  0.0 - 7.0 % Final  . BASO% 09/12/2013 0.6  0.0 - 2.0 % Final    RADIOGRAPHIC STUDIES: I have personally reviewed the radiological images as listed  and agreed with the findings in the report. No results found.   ASSESSMENT & PLAN:  Breast cancer, ILC, left, receptor +, her 2 neg Patient is here for six-month followup she recent mammogram of the right wrist as well as DEXA scan and is here today to discuss the results and mammogram was completely normal without any abnormalities the DEXA scan showed T score of -2.1 suggestive of osteopenia. The breast exam did not reveal any abnormalities in the right breast and she will be seen by [place] in 6 months for another routine followup.  Left chest wall pain related to prior surgery and radiation therapy with scarring I believe that if her pain is persistent or getting worse we can consider sending her to a pain physician for nerve block.  Continue with the current treatment with aromatase inhibitor and return back to see [place] in 6  Osteopenia due to cancer therapy I recommended starting the patient on calcium plus vitamin D twice a day along with Boniva once a month I instructed her not to lie down after taking Boniva and to take it with a large glass of water. I also instructed her to discontinue Boniva if she were to have extractions for one to 2 months before and 1-2 months after to prevent osteonecrosis of the jaw the prescriptions were sent to [facility]  Pancytopenia I reviewed the blood work from today her CBC is completely normal as this issue has been resolved.   No orders of the defined types were placed in this encounter.   The patient has a good understanding of the overall plan. she agrees with it. She will call with any problems that may develop before her next visit here.  I spent 20 minutes counseling the patient face to face. The total time spent in the appointment was 30 minutes and more than 50% was on counseling and review of test results    Rulon Eisenmenger, MD 09/12/2013 12:18 PM

## 2013-09-12 NOTE — Assessment & Plan Note (Signed)
I reviewed the blood work from today her CBC is completely normal as this issue has been resolved.

## 2013-09-13 NOTE — Progress Notes (Signed)
PRescription for Boniva and Ca Vit D faxed to [facility].  Sent to scan.

## 2013-10-04 ENCOUNTER — Other Ambulatory Visit: Payer: Self-pay | Admitting: Endocrinology

## 2013-10-04 MED ORDER — INSULIN LISPRO 100 UNIT/ML (KWIKPEN): PEN_INJECTOR | SUBCUTANEOUS | Status: DC

## 2013-10-21 ENCOUNTER — Other Ambulatory Visit: Payer: Self-pay | Admitting: Pulmonary Disease

## 2013-12-04 ENCOUNTER — Encounter: Payer: Self-pay | Admitting: Hematology and Oncology

## 2014-03-05 DIAGNOSIS — D229 Melanocytic nevi, unspecified: Secondary | ICD-10-CM

## 2014-03-05 HISTORY — DX: Melanocytic nevi, unspecified: D22.9

## 2014-03-12 ENCOUNTER — Ambulatory Visit (INDEPENDENT_AMBULATORY_CARE_PROVIDER_SITE_OTHER): Payer: Medicare HMO | Admitting: Ophthalmology

## 2014-03-26 ENCOUNTER — Other Ambulatory Visit: Payer: Self-pay

## 2014-03-26 ENCOUNTER — Ambulatory Visit: Payer: Medicare HMO | Admitting: Hematology and Oncology

## 2014-03-26 ENCOUNTER — Telehealth: Payer: Self-pay | Admitting: Hematology and Oncology

## 2014-03-26 NOTE — Assessment & Plan Note (Signed)
Left breast invasive lobular cancer ER/PR positive HER-2 negative diagnosed 02/20/2011 status post mastectomy 6 cm invasive lobular cancer with lymphovascular invasion and perineural invasion, 9/16 lymph nodes positive with extracapsular extension E 100% via her percent HER-2 negative Ki-67 15% T3 N2 stage IIIa status post chemotherapy FEC 4 followed by radiation and now on letrozole since 10/16/2011  Letrozole toxicities: No major toxicities to letrozole tolerating it very well Breast cancer surveillance: 1. Mammogram 07/24/2013 is normal 2. DEXA scan February 2015 showed T score of -2.1: Currently on calcium with vitamin D and Boniva 3. Breast exam 09/12/2013 is normal Return to clinic in 6 months for follow-up

## 2014-03-26 NOTE — Telephone Encounter (Signed)
, °

## 2014-04-03 ENCOUNTER — Ambulatory Visit (HOSPITAL_BASED_OUTPATIENT_CLINIC_OR_DEPARTMENT_OTHER): Payer: Medicare HMO | Admitting: Hematology and Oncology

## 2014-04-03 ENCOUNTER — Telehealth: Payer: Self-pay | Admitting: Hematology and Oncology

## 2014-04-03 ENCOUNTER — Ambulatory Visit (HOSPITAL_BASED_OUTPATIENT_CLINIC_OR_DEPARTMENT_OTHER): Payer: Medicare HMO

## 2014-04-03 ENCOUNTER — Other Ambulatory Visit: Payer: Self-pay | Admitting: Hematology and Oncology

## 2014-04-03 ENCOUNTER — Other Ambulatory Visit: Payer: Self-pay

## 2014-04-03 VITALS — BP 140/74 | HR 102 | Temp 97.7°F | Resp 19 | Ht 68.0 in | Wt 202.2 lb

## 2014-04-03 DIAGNOSIS — Z853 Personal history of malignant neoplasm of breast: Secondary | ICD-10-CM

## 2014-04-03 DIAGNOSIS — C50312 Malignant neoplasm of lower-inner quadrant of left female breast: Secondary | ICD-10-CM

## 2014-04-03 DIAGNOSIS — Z79818 Long term (current) use of other agents affecting estrogen receptors and estrogen levels: Secondary | ICD-10-CM

## 2014-04-03 LAB — COMPREHENSIVE METABOLIC PANEL (CC13)
ALBUMIN: 3.3 g/dL — AB (ref 3.5–5.0)
ALK PHOS: 108 U/L (ref 40–150)
ALT: 21 U/L (ref 0–55)
AST: 21 U/L (ref 5–34)
Anion Gap: 12 mEq/L — ABNORMAL HIGH (ref 3–11)
BILIRUBIN TOTAL: 0.37 mg/dL (ref 0.20–1.20)
BUN: 16.6 mg/dL (ref 7.0–26.0)
CO2: 28 meq/L (ref 22–29)
Calcium: 9.7 mg/dL (ref 8.4–10.4)
Chloride: 103 mEq/L (ref 98–109)
Creatinine: 0.8 mg/dL (ref 0.6–1.1)
EGFR: 71 mL/min/{1.73_m2} — ABNORMAL LOW (ref >90)
Glucose: 187 mg/dl — ABNORMAL HIGH (ref 70–140)
Potassium: 4 mEq/L (ref 3.5–5.1)
SODIUM: 143 meq/L (ref 136–145)
Total Protein: 6.8 g/dL (ref 6.4–8.3)

## 2014-04-03 LAB — CBC WITH DIFFERENTIAL/PLATELET
BASO%: 0.8 % (ref 0.0–2.0)
BASOS ABS: 0.1 10*3/uL (ref 0.0–0.1)
EOS ABS: 0.2 10*3/uL (ref 0.0–0.5)
EOS%: 2.6 % (ref 0.0–7.0)
HCT: 42.3 % (ref 34.8–46.6)
HGB: 13.4 g/dL (ref 11.6–15.9)
LYMPH%: 23.4 % (ref 14.0–49.7)
MCH: 28.2 pg (ref 25.1–34.0)
MCHC: 31.8 g/dL (ref 31.5–36.0)
MCV: 88.8 fL (ref 79.5–101.0)
MONO#: 0.6 10*3/uL (ref 0.1–0.9)
MONO%: 7.8 % (ref 0.0–14.0)
NEUT#: 4.7 10*3/uL (ref 1.5–6.5)
NEUT%: 65.4 % (ref 38.4–76.8)
Platelets: 242 10*3/uL (ref 145–400)
RBC: 4.76 10*6/uL (ref 3.70–5.45)
RDW: 15.1 % — ABNORMAL HIGH (ref 11.2–14.5)
WBC: 7.3 10*3/uL (ref 3.9–10.3)
lymph#: 1.7 10*3/uL (ref 0.9–3.3)

## 2014-04-03 MED ORDER — AZITHROMYCIN 250 MG PO TABS: ORAL_TABLET | ORAL | Status: DC

## 2014-04-03 NOTE — Addendum Note (Signed)
Addended by: Prentiss Bells on: 04/03/2014 12:45 PM   Modules accepted: Orders

## 2014-04-03 NOTE — Telephone Encounter (Signed)
S/w pt and she is aware of her appts    anne

## 2014-04-03 NOTE — Progress Notes (Signed)
Patient Care Team: Renato Shin, MD as PCP - General (Endocrinology) Haywood Lasso, MD as Surgeon (General Surgery)  DIAGNOSIS: Primary cancer of lower-inner quadrant of left female breast   Staging form: Breast, AJCC 7th Edition     Clinical: Stage IIB (T3, N0, cM0) - Signed by Haywood Lasso, MD on 03/05/2011       Prognostic indicators: ER 100%  PR 100%  Ki67 18%  Her2 neg 1.31      Pathologic: Stage IIIA (T3, N2a, cM0) - Signed by Haywood Lasso, MD on 03/25/2011       Prognostic indicators: ER 100%  PR 100%  Ki67 18%  Her2 neg 1.31    SUMMARY OF ONCOLOGIC HISTORY:   Primary cancer of lower-inner quadrant of left female breast   02/20/2011 Initial Diagnosis Left Breast invasive lobular carcinoma; 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 Her clinical staging was stage IIB (T3, N0, M0).     03/23/2011 Surgery L mastectomy + Axillary LND 6 cm ILC with LVI, PNI; 9/16 LN positive with Extracapsular ext ER 100%; PR 100%; Her 2 1.31; Ki 67: 18; pT3a N2a (stage IIIA)   04/17/2011 - 06/19/2011 Chemotherapy FEC X 4 complicated by febrile neutropenis, spesis and chest wall abscess   10/07/2011 - 11/24/2011 Radiation Therapy Left chest wall and axilla 5040 cGy in 28 # with Boost   10/16/2011 -  Anti-estrogen oral therapy Letrozole 2.5 mg po daily    CHIEF COMPLIANT: Severe bronchitis and sinusitis  INTERVAL HISTORY: [name] is a 74 year old lady with above-mentioned history of left breast cancer who is currently omeprazole and tolerating it fairly well. She had osteopenia with a T score of -2.1 and I recommended Boniva but she is not taking it because it was not covered by her insurance. She complains of severe bronchitis and sinusitis and feeling miserable for the past 1 week. She denies any fevers or chills. Does have a cough with greenish expectoration.  REVIEW OF SYSTEMS:   Constitutional: Denies fevers, chills or abnormal weight  loss Eyes: Denies blurriness of vision Ears, nose, mouth, throat, and face: Cough with expectoration of greenish sputum Respiratory: Severe bronchitis and laryngitis Cardiovascular: Denies palpitation, chest discomfort or lower extremity swelling Gastrointestinal:  Denies nausea, heartburn or change in bowel habits Skin: Denies abnormal skin rashes Lymphatics: Denies new lymphadenopathy or easy bruising Neurological:Denies numbness, tingling or new weaknesses Behavioral/Psych: Mood is stable, no new changes  Breast:  denies any pain or lumps or nodules in either breasts All other systems were reviewed with the patient and are negative.  I have reviewed the past medical history, past surgical history, social history and family history with the patient and they are unchanged from previous note.  ALLERGIES:  is allergic to cephalexin; clindamycin; dilaudid; oxycodone-acetaminophen; rocephin; and sulfonamide derivatives.  MEDICATIONS:  Current Outpatient Prescriptions  Medication Sig Dispense Refill  . acyclovir (ZOVIRAX) 200 MG capsule TAKE 2 CAPSULES BY MOUTH 3 TIMES DAILY 540 capsule 3  . ALPRAZolam (XANAX) 0.5 MG tablet TAKE ONE-HALF TO ONE TABLET BY MOUTH THREE TIMES DAILY AS NEEDED FOR  NERVES 270 tablet 1  . amLODipine (NORVASC) 10 MG tablet Take 1 tablet (10 mg total) by mouth daily. 90 tablet 3  . atorvastatin (LIPITOR) 40 MG tablet Take 1 tablet (40 mg total) by mouth daily. 90 tablet 3  . Blood Glucose Monitoring Suppl (EASY TALK BLOOD GLUCOSE SYSTEM) DEVI Use once daily as directed to check blood glucose    .  Calcium Carbonate-Vitamin D (CALCIUM 600+D3) 600-400 MG-UNIT per tablet Take 1 tablet by mouth 2 (two) times daily. 180 tablet 3  . cholecalciferol (VITAMIN D) 1000 UNITS tablet Take 1 tablet (1,000 Units total) by mouth daily. 90 tablet 3  . cyanocobalamin 500 MCG tablet Take 1 tablet (500 mcg total) by mouth daily. 90 tablet 3  . EASY TALK BLOOD GLUCOSE TEST test strip Use  to check blood glucose once daily as directed    . ibandronate (BONIVA) 150 MG tablet Take 1 tablet (150 mg total) by mouth every 30 (thirty) days. Take in the morning with a full glass of water, on an empty stomach, and do not take anything else by mouth or lie down for the next 30 min. 3 tablet 3  . Insulin Glargine (LANTUS) 100 UNIT/ML Solostar Pen Inject 55 Units into the skin at bedtime. 60 mL 3  . insulin lispro (HUMALOG) 100 UNIT/ML KiwkPen 3 times a day (just before each meal) 20-15-20 units, and pen needles 4/day 60 mL 1  . Insulin Pen Needle (RELION PEN NEEDLE 31G/8MM) 31G X 8 MM MISC 1 Device by Does not apply route 4 (four) times daily. 120 each 11  . letrozole (FEMARA) 2.5 MG tablet TAKE ONE TABLET BY MOUTH DAILY. 90 tablet 12  . losartan (COZAAR) 100 MG tablet Take 1 tablet (100 mg total) by mouth daily. 90 tablet 3  . Multiple Vitamins-Minerals (ONE-A-DAY WOMENS 50+ ADVANTAGE) TABS Take 1 tablet by mouth daily. 90 tablet 3  . omeprazole (PRILOSEC) 20 MG capsule Take 1 capsule (20 mg total) by mouth daily. 90 capsule 3  . sertraline (ZOLOFT) 50 MG tablet Take 1 tablet (50 mg total) by mouth daily. 90 tablet 3  . tolterodine (DETROL LA) 2 MG 24 hr capsule Take 2 mg by mouth.    . [DISCONTINUED] prochlorperazine (COMPAZINE) 10 MG tablet Take 1 tablet (10 mg total) by mouth every 6 (six) hours as needed (Nausea or vomiting). 30 tablet 1   No current facility-administered medications for this visit.    PHYSICAL EXAMINATION: ECOG PERFORMANCE STATUS: 1 - Symptomatic but completely ambulatory  Filed Vitals:   04/03/14 1137  BP: 140/74  Pulse: 102  Temp: 97.7 F (36.5 C)  Resp: 19   Filed Weights   04/03/14 1137  Weight: 202 lb 3 oz (91.712 kg)    GENERAL:alert, no distress and comfortable SKIN: skin color, texture, turgor are normal, no rashes or significant lesions EYES: normal, Conjunctiva are pink and non-injected, sclera clear OROPHARYNX:no exudate, no erythema and lips,  buccal mucosa, and tongue normal  NECK: supple, thyroid normal size, non-tender, without nodularity LYMPH:  no palpable lymphadenopathy in the cervical, axillary or inguinal LUNGS: clear to auscultation and percussion with normal breathing effort HEART: regular rate & rhythm and no murmurs and no lower extremity edema ABDOMEN:abdomen soft, non-tender and normal bowel sounds Musculoskeletal:no cyanosis of digits and no clubbing  NEURO: alert & oriented x 3 with fluent speech, no focal motor/sensory deficits  LABORATORY DATA:  I have reviewed the data as listed   Chemistry      Component Value Date/Time   NA 137 09/12/2013 1104   NA 143 03/03/2013 1144   K 4.3 09/12/2013 1104   K 3.8 03/03/2013 1144   CL 99 09/12/2013 1104   CL 102 02/25/2012 0908   CO2 29 09/12/2013 1104   CO2 28 03/03/2013 1144   BUN 15 09/12/2013 1104   BUN 17.0 03/03/2013 1144   CREATININE 0.79  09/12/2013 1104   CREATININE 0.8 03/03/2013 1144      Component Value Date/Time   CALCIUM 9.5 09/12/2013 1104   CALCIUM 9.9 03/03/2013 1144   ALKPHOS 121* 09/12/2013 1104   ALKPHOS 141 03/03/2013 1144   AST 19 09/12/2013 1104   AST 22 03/03/2013 1144   ALT 22 09/12/2013 1104   ALT 25 03/03/2013 1144   BILITOT 0.5 09/12/2013 1104   BILITOT 0.44 03/03/2013 1144       Lab Results  Component Value Date   WBC 5.1 09/12/2013   HGB 13.9 09/12/2013   HCT 42.5 09/12/2013   MCV 88.5 09/12/2013   PLT 182 09/12/2013   NEUTROABS 3.2 09/12/2013   ASSESSMENT & PLAN:  Primary cancer of lower-inner quadrant of left female breast Left breast invasive lobular cancer diagnosed 02/20/2011 status post left mastectomy with axillary lymph node dissection, 6 cm ILC with LVI, PNI, 9/16 lymph nodes positive with extracapsular extension, ER 100%, PR 100%, HER-2 -1.31, Ki-67 18%, T3a N2 a M0 stage IIIa status post F AC chemotherapy 4 followed by radiation and currently on letrozole 2.5 mg daily since 10/16/2011  Letrozole  toxicities: 1. Hot flashes but not very severe 2. Occasional chest tightness  Breast cancer surveillance: 1. Mammogram 07/24/2013 was normal 2. Chest wall and breast exam 04/03/2014 is normal 3. Bone density 03/06/2013 showed a T score of -2.1 patient was prescribed Boniva but she does not want to take it because it wasn't covered by insurance. I recommended that we can switch to alendronate that she can take once a week.  Severe bronchitis and sinusitis: I prescribed her azithromycin We will also obtain blood work today  Irregular heart rate: EKG done in the clinic showed sinus arrhythmia. If she continues to have palpitations, I instructed her to call her family doctor to discuss this further.  Return to clinic in 6 months for follow-up  No orders of the defined types were placed in this encounter.   The patient has a good understanding of the overall plan. she agrees with it. She will call with any problems that may develop before her next visit here.   Rulon Eisenmenger, MD   He

## 2014-04-03 NOTE — Assessment & Plan Note (Signed)
Left breast invasive lobular cancer diagnosed 02/20/2011 status post left mastectomy with axillary lymph node dissection, 6 cm ILC with LVI, PNI, 9/16 lymph nodes positive with extracapsular extension, ER 100%, PR 100%, HER-2 -1.31, Ki-67 18%, T3a N2 a M0 stage IIIa status post F AC chemotherapy 4 followed by radiation and currently on letrozole 2.5 mg daily since 10/16/2011  Letrozole toxicities: 1. Hot flashes but not very severe 2. Occasional chest tightness  Breast cancer surveillance: 1. Mammogram 07/24/2013 was normal 2. Chest wall and breast exam 04/03/2014 is normal 3. Bone density 03/06/2013 showed a T score of -2.1   Return to clinic in 6 months for follow-up

## 2014-04-04 ENCOUNTER — Other Ambulatory Visit: Payer: Self-pay | Admitting: Endocrinology

## 2014-04-04 ENCOUNTER — Encounter: Payer: Self-pay | Admitting: Hematology and Oncology

## 2014-04-17 ENCOUNTER — Ambulatory Visit (HOSPITAL_BASED_OUTPATIENT_CLINIC_OR_DEPARTMENT_OTHER): Payer: Medicare HMO

## 2014-04-17 DIAGNOSIS — M8580 Other specified disorders of bone density and structure, unspecified site: Secondary | ICD-10-CM

## 2014-04-17 MED ORDER — DENOSUMAB 60 MG/ML ~~LOC~~ SOLN
60.0000 mg | Freq: Once | SUBCUTANEOUS | Status: AC
  Administered 2014-04-17: 60 mg via SUBCUTANEOUS
  Filled 2014-04-17: qty 1

## 2014-04-17 NOTE — Patient Instructions (Signed)
Denosumab injection What is this medicine? DENOSUMAB (den oh sue mab) slows bone breakdown. Prolia is used to treat osteoporosis in women after menopause and in men. Xgeva is used to prevent bone fractures and other bone problems caused by cancer bone metastases. Xgeva is also used to treat giant cell tumor of the bone. This medicine may be used for other purposes; ask your health care provider or pharmacist if you have questions. COMMON BRAND NAME(S): Prolia, XGEVA What should I tell my health care provider before I take this medicine? They need to know if you have any of these conditions: -dental disease -eczema -infection or history of infections -kidney disease or on dialysis -low blood calcium or vitamin D -malabsorption syndrome -scheduled to have surgery or tooth extraction -taking medicine that contains denosumab -thyroid or parathyroid disease -an unusual reaction to denosumab, other medicines, foods, dyes, or preservatives -pregnant or trying to get pregnant -breast-feeding How should I use this medicine? This medicine is for injection under the skin. It is given by a health care professional in a hospital or clinic setting. If you are getting Prolia, a special MedGuide will be given to you by the pharmacist with each prescription and refill. Be sure to read this information carefully each time. For Prolia, talk to your pediatrician regarding the use of this medicine in children. Special care may be needed. For Xgeva, talk to your pediatrician regarding the use of this medicine in children. While this drug may be prescribed for children as young as 13 years for selected conditions, precautions do apply. Overdosage: If you think you've taken too much of this medicine contact a poison control center or emergency room at once. Overdosage: If you think you have taken too much of this medicine contact a poison control center or emergency room at once. NOTE: This medicine is only for  you. Do not share this medicine with others. What if I miss a dose? It is important not to miss your dose. Call your doctor or health care professional if you are unable to keep an appointment. What may interact with this medicine? Do not take this medicine with any of the following medications: -other medicines containing denosumab This medicine may also interact with the following medications: -medicines that suppress the immune system -medicines that treat cancer -steroid medicines like prednisone or cortisone This list may not describe all possible interactions. Give your health care provider a list of all the medicines, herbs, non-prescription drugs, or dietary supplements you use. Also tell them if you smoke, drink alcohol, or use illegal drugs. Some items may interact with your medicine. What should I watch for while using this medicine? Visit your doctor or health care professional for regular checks on your progress. Your doctor or health care professional may order blood tests and other tests to see how you are doing. Call your doctor or health care professional if you get a cold or other infection while receiving this medicine. Do not treat yourself. This medicine may decrease your body's ability to fight infection. You should make sure you get enough calcium and vitamin D while you are taking this medicine, unless your doctor tells you not to. Discuss the foods you eat and the vitamins you take with your health care professional. See your dentist regularly. Brush and floss your teeth as directed. Before you have any dental work done, tell your dentist you are receiving this medicine. Do not become pregnant while taking this medicine or for 5 months after stopping   it. Women should inform their doctor if they wish to become pregnant or think they might be pregnant. There is a potential for serious side effects to an unborn child. Talk to your health care professional or pharmacist for more  information. What side effects may I notice from receiving this medicine? Side effects that you should report to your doctor or health care professional as soon as possible: -allergic reactions like skin rash, itching or hives, swelling of the face, lips, or tongue -breathing problems -chest pain -fast, irregular heartbeat -feeling faint or lightheaded, falls -fever, chills, or any other sign of infection -muscle spasms, tightening, or twitches -numbness or tingling -skin blisters or bumps, or is dry, peels, or red -slow healing or unexplained pain in the mouth or jaw -unusual bleeding or bruising Side effects that usually do not require medical attention (Report these to your doctor or health care professional if they continue or are bothersome.): -muscle pain -stomach upset, gas This list may not describe all possible side effects. Call your doctor for medical advice about side effects. You may report side effects to FDA at 1-800-FDA-1088. Where should I keep my medicine? This medicine is only given in a clinic, doctor's office, or other health care setting and will not be stored at home. NOTE: This sheet is a summary. It may not cover all possible information. If you have questions about this medicine, talk to your doctor, pharmacist, or health care provider.  2015, Elsevier/Gold Standard. (2011-07-20 12:37:47)  

## 2014-04-17 NOTE — Progress Notes (Signed)
Prolia consent form signed

## 2014-06-20 DIAGNOSIS — B009 Herpesviral infection, unspecified: Secondary | ICD-10-CM | POA: Insufficient documentation

## 2014-06-20 DIAGNOSIS — K219 Gastro-esophageal reflux disease without esophagitis: Secondary | ICD-10-CM | POA: Insufficient documentation

## 2014-07-30 ENCOUNTER — Other Ambulatory Visit: Payer: Self-pay

## 2014-08-03 ENCOUNTER — Other Ambulatory Visit: Payer: Self-pay

## 2014-08-03 DIAGNOSIS — Z1231 Encounter for screening mammogram for malignant neoplasm of breast: Secondary | ICD-10-CM

## 2014-09-07 ENCOUNTER — Ambulatory Visit
Admission: RE | Admit: 2014-09-07 | Discharge: 2014-09-07 | Disposition: A | Discharge status: Home / Self Care | Payer: Medicare HMO | Source: Ambulatory Visit

## 2014-09-07 DIAGNOSIS — Z1231 Encounter for screening mammogram for malignant neoplasm of breast: Secondary | ICD-10-CM

## 2014-10-18 ENCOUNTER — Ambulatory Visit (HOSPITAL_BASED_OUTPATIENT_CLINIC_OR_DEPARTMENT_OTHER): Payer: Medicare HMO

## 2014-10-18 ENCOUNTER — Other Ambulatory Visit (HOSPITAL_BASED_OUTPATIENT_CLINIC_OR_DEPARTMENT_OTHER): Payer: Medicare HMO

## 2014-10-18 ENCOUNTER — Ambulatory Visit (HOSPITAL_BASED_OUTPATIENT_CLINIC_OR_DEPARTMENT_OTHER): Payer: Medicare HMO | Admitting: Hematology and Oncology

## 2014-10-18 ENCOUNTER — Encounter: Payer: Self-pay | Admitting: Hematology and Oncology

## 2014-10-18 ENCOUNTER — Telehealth: Payer: Self-pay | Admitting: Hematology and Oncology

## 2014-10-18 VITALS — BP 124/77 | HR 85 | Temp 97.7°F | Resp 18 | Ht 68.0 in | Wt 201.1 lb

## 2014-10-18 DIAGNOSIS — C50312 Malignant neoplasm of lower-inner quadrant of left female breast: Secondary | ICD-10-CM | POA: Diagnosis not present

## 2014-10-18 DIAGNOSIS — M8580 Other specified disorders of bone density and structure, unspecified site: Secondary | ICD-10-CM | POA: Diagnosis not present

## 2014-10-18 LAB — COMPREHENSIVE METABOLIC PANEL (CC13)
ALK PHOS: 91 U/L (ref 40–150)
ALT: 25 U/L (ref 0–55)
AST: 20 U/L (ref 5–34)
Albumin: 3.6 g/dL (ref 3.5–5.0)
Anion Gap: 7 mEq/L (ref 3–11)
BILIRUBIN TOTAL: 0.43 mg/dL (ref 0.20–1.20)
BUN: 13.6 mg/dL (ref 7.0–26.0)
CALCIUM: 9.8 mg/dL (ref 8.4–10.4)
CO2: 31 mEq/L — ABNORMAL HIGH (ref 22–29)
Chloride: 105 mEq/L (ref 98–109)
Creatinine: 0.9 mg/dL (ref 0.6–1.1)
EGFR: 65 mL/min/{1.73_m2} — AB (ref >90)
GLUCOSE: 195 mg/dL — AB (ref 70–140)
POTASSIUM: 4.1 meq/L (ref 3.5–5.1)
SODIUM: 143 meq/L (ref 136–145)
TOTAL PROTEIN: 6.9 g/dL (ref 6.4–8.3)

## 2014-10-18 LAB — CBC WITH DIFFERENTIAL/PLATELET
BASO%: 0.3 % (ref 0.0–2.0)
BASOS ABS: 0 10*3/uL (ref 0.0–0.1)
EOS%: 2 % (ref 0.0–7.0)
Eosinophils Absolute: 0.1 10*3/uL (ref 0.0–0.5)
HCT: 43.4 % (ref 34.8–46.6)
HEMOGLOBIN: 14.4 g/dL (ref 11.6–15.9)
LYMPH%: 29.8 % (ref 14.0–49.7)
MCH: 29.4 pg (ref 25.1–34.0)
MCHC: 33.2 g/dL (ref 31.5–36.0)
MCV: 88.8 fL (ref 79.5–101.0)
MONO#: 0.7 10*3/uL (ref 0.1–0.9)
MONO%: 10.3 % (ref 0.0–14.0)
NEUT%: 57.6 % (ref 38.4–76.8)
NEUTROS ABS: 4 10*3/uL (ref 1.5–6.5)
Platelets: 221 10*3/uL (ref 145–400)
RBC: 4.89 10*6/uL (ref 3.70–5.45)
RDW: 13.9 % (ref 11.2–14.5)
WBC: 7 10*3/uL (ref 3.9–10.3)
lymph#: 2.1 10*3/uL (ref 0.9–3.3)

## 2014-10-18 MED ORDER — DENOSUMAB 60 MG/ML ~~LOC~~ SOLN
60.0000 mg | Freq: Once | SUBCUTANEOUS | Status: AC
  Administered 2014-10-18: 60 mg via SUBCUTANEOUS
  Filled 2014-10-18: qty 1

## 2014-10-18 NOTE — Assessment & Plan Note (Signed)
Left breast invasive lobular cancer diagnosed 02/20/2011 status post left mastectomy with axillary lymph node dissection, 6 cm ILC with LVI, PNI, 9/16 lymph nodes positive with extracapsular extension, ER 100%, PR 100%, HER-2 -1.31, Ki-67 18%, T3a N2 a M0 stage IIIa status post F AC chemotherapy 4 followed by radiation and currently on letrozole 2.5 mg daily since 10/16/2011  Letrozole toxicities: 1. Hot flashes but not very severe 2. Occasional chest tightness  Breast cancer surveillance: 1. Mammogram 09/07/2014 right breast was normal, breast density category C 2. Chest wall and breast exam 10/18/2014 is normal 3. Bone density 03/06/2013 showed a T score of -2.1 Boniva was not covered by her insurance and we switched her to Fosamax March 2016

## 2014-10-18 NOTE — Telephone Encounter (Signed)
Gave avs & calendar for March 2017. °

## 2014-10-18 NOTE — Progress Notes (Signed)
Patient Care Team: Renato Shin, MD as PCP - General (Endocrinology) Neldon Mc, MD as Surgeon (General Surgery)  DIAGNOSIS: Primary cancer of lower-inner quadrant of left female breast   Staging form: Breast, AJCC 7th Edition     Clinical: Stage IIB (T3, N0, cM0) - Signed by Haywood Lasso, MD on 03/05/2011       Prognostic indicators: ER 100%  PR 100%  Ki67 18%  Her2 neg 1.31      Pathologic: Stage IIIA (T3, N2a, cM0) - Signed by Haywood Lasso, MD on 03/25/2011       Prognostic indicators: ER 100%  PR 100%  Ki67 18%  Her2 neg 1.31    SUMMARY OF ONCOLOGIC HISTORY:   Primary cancer of lower-inner quadrant of left female breast   02/20/2011 Initial Diagnosis Left Breast invasive lobular carcinoma; 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 Her clinical staging was stage IIB (T3, N0, M0).     03/23/2011 Surgery L mastectomy + Axillary LND 6 cm ILC with LVI, PNI; 9/16 LN positive with Extracapsular ext ER 100%; PR 100%; Her 2 1.31; Ki 67: 18; pT3a N2a (stage IIIA)   04/17/2011 - 06/19/2011 Chemotherapy FEC X 4 complicated by febrile neutropenis, spesis and chest wall abscess   10/07/2011 - 11/24/2011 Radiation Therapy Left chest wall and axilla 5040 cGy in 28 # with Boost   10/16/2011 -  Anti-estrogen oral therapy Letrozole 2.5 mg po daily    CHIEF COMPLIANT: Follow-up of letrozole  INTERVAL HISTORY: [name] is a 74 year old with above-mentioned history of left breast cancer treated with mastectomy followed by adjuvant chemotherapy and radiation and is currently an oral antiestrogen therapy with letrozole. She is tolerating letrozole fairly well. She has completed 3 years of therapy. She reports occasional hot flashes and myalgias but not too severe.  REVIEW OF SYSTEMS:   Constitutional: Denies fevers, chills or abnormal weight loss Eyes: Denies blurriness of vision Ears, nose, mouth, throat, and face: Denies mucositis or sore  throat Respiratory: Denies cough, dyspnea or wheezes Cardiovascular: Denies palpitation, chest discomfort or lower extremity swelling Gastrointestinal:  Denies nausea, heartburn or change in bowel habits Skin: Denies abnormal skin rashes Lymphatics: Denies new lymphadenopathy or easy bruising Neurological:Denies numbness, tingling or new weaknesses Behavioral/Psych: Mood is stable, no new changes  Breast:  denies any pain or lumps or nodules in either breasts All other systems were reviewed with the patient and are negative.  I have reviewed the past medical history, past surgical history, social history and family history with the patient and they are unchanged from previous note.  ALLERGIES:  is allergic to cephalexin; clindamycin; dilaudid; oxycodone-acetaminophen; rocephin; and sulfonamide derivatives.  MEDICATIONS:  Current Outpatient Prescriptions  Medication Sig Dispense Refill  . acyclovir (ZOVIRAX) 200 MG capsule TAKE 2 CAPSULES BY MOUTH 3 TIMES DAILY 540 capsule 3  . ALPRAZolam (XANAX) 0.5 MG tablet TAKE ONE-HALF TO ONE TABLET BY MOUTH THREE TIMES DAILY AS NEEDED FOR  NERVES 270 tablet 1  . amLODipine (NORVASC) 10 MG tablet Take 1 tablet (10 mg total) by mouth daily. 90 tablet 3  . atorvastatin (LIPITOR) 40 MG tablet Take 1 tablet (40 mg total) by mouth daily. 90 tablet 3  . Blood Glucose Monitoring Suppl (EASY TALK BLOOD GLUCOSE SYSTEM) DEVI Use once daily as directed to check blood glucose    . cholecalciferol (VITAMIN D) 1000 UNITS tablet Take 1 tablet (1,000 Units total) by mouth daily. 90 tablet 3  . cyanocobalamin 500 MCG tablet  Take 1 tablet (500 mcg total) by mouth daily. 90 tablet 3  . EASY TALK BLOOD GLUCOSE TEST test strip Use to check blood glucose once daily as directed    . Insulin Glargine (LANTUS) 100 UNIT/ML Solostar Pen Inject 55 Units into the skin at bedtime. 60 mL 3  . Insulin Pen Needle (RELION PEN NEEDLE 31G/8MM) 31G X 8 MM MISC 1 Device by Does not apply  route 4 (four) times daily. 120 each 11  . letrozole (FEMARA) 2.5 MG tablet TAKE ONE TABLET BY MOUTH DAILY. 90 tablet 12  . losartan (COZAAR) 100 MG tablet Take 1 tablet (100 mg total) by mouth daily. 90 tablet 3  . metFORMIN (GLUCOPHAGE) 500 MG tablet TAKE ONE TABLET BID    . Multiple Vitamins-Minerals (ONE-A-DAY WOMENS 50+ ADVANTAGE) TABS Take 1 tablet by mouth daily. 90 tablet 3  . omeprazole (PRILOSEC) 20 MG capsule Take 1 capsule (20 mg total) by mouth daily. 90 capsule 3  . sertraline (ZOLOFT) 50 MG tablet Take 1 tablet (50 mg total) by mouth daily. 90 tablet 3  . tolterodine (DETROL LA) 4 MG 24 hr capsule Take 4 mg by mouth.    Marland Kitchen albuterol (PROVENTIL) (2.5 MG/3ML) 0.083% nebulizer solution     . chlorpheniramine-HYDROcodone (TUSSIONEX) 10-8 MG/5ML LQCR Take 1/2 to 1 tsp at bedtime for severe cough    . [DISCONTINUED] prochlorperazine (COMPAZINE) 10 MG tablet Take 1 tablet (10 mg total) by mouth every 6 (six) hours as needed (Nausea or vomiting). 30 tablet 1   No current facility-administered medications for this visit.    PHYSICAL EXAMINATION: ECOG PERFORMANCE STATUS: 1 - Symptomatic but completely ambulatory  Filed Vitals:   10/18/14 1338  BP: 124/77  Pulse: 85  Temp: 97.7 F (36.5 C)  Resp: 18   Filed Weights   10/18/14 1338  Weight: 201 lb 1.6 oz (91.218 kg)    GENERAL:alert, no distress and comfortable SKIN: skin color, texture, turgor are normal, no rashes or significant lesions EYES: normal, Conjunctiva are pink and non-injected, sclera clear OROPHARYNX:no exudate, no erythema and lips, buccal mucosa, and tongue normal  NECK: supple, thyroid normal size, non-tender, without nodularity LYMPH:  no palpable lymphadenopathy in the cervical, axillary or inguinal LUNGS: clear to auscultation and percussion with normal breathing effort HEART: regular rate & rhythm and no murmurs and no lower extremity edema ABDOMEN:abdomen soft, non-tender and normal bowel  sounds Musculoskeletal:no cyanosis of digits and no clubbing  NEURO: alert & oriented x 3 with fluent speech, no focal motor/sensory deficits BREAST: Left mastectomy without any palpable nodularity. Right breast is without any lumps or nodules. (exam performed in the presence of a chaperone)  LABORATORY DATA:  I have reviewed the data as listed   Chemistry      Component Value Date/Time   NA 143 04/03/2014 1239   NA 137 09/12/2013 1104   K 4.0 04/03/2014 1239   K 4.3 09/12/2013 1104   CL 99 09/12/2013 1104   CL 102 02/25/2012 0908   CO2 28 04/03/2014 1239   CO2 29 09/12/2013 1104   BUN 16.6 04/03/2014 1239   BUN 15 09/12/2013 1104   CREATININE 0.8 04/03/2014 1239   CREATININE 0.79 09/12/2013 1104      Component Value Date/Time   CALCIUM 9.7 04/03/2014 1239   CALCIUM 9.5 09/12/2013 1104   ALKPHOS 108 04/03/2014 1239   ALKPHOS 121* 09/12/2013 1104   AST 21 04/03/2014 1239   AST 19 09/12/2013 1104   ALT 21 04/03/2014  1239   ALT 22 09/12/2013 1104   BILITOT 0.37 04/03/2014 1239   BILITOT 0.5 09/12/2013 1104       Lab Results  Component Value Date   WBC 7.0 10/18/2014   HGB 14.4 10/18/2014   HCT 43.4 10/18/2014   MCV 88.8 10/18/2014   PLT 221 10/18/2014   NEUTROABS 4.0 10/18/2014   ASSESSMENT & PLAN:  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 Hot flashes but not very severe 2. Occasional chest tightness  Breast cancer surveillance: 1. Mammogram 09/07/2014 right breast was normal, breast density category C 2. Chest wall and breast exam 10/18/2014 is  normal 3. Bone density 03/06/2013 showed a T score of -2.1 Boniva was not covered by her insurance and we switched her to Prolia since March 2016.   Return to clinic in 6 months for Prolia follow-up    Orders Placed This Encounter  Procedures  . CBC with Differential    Standing Status: Future     Number of Occurrences:      Standing Expiration Date: 10/18/2015  . Comprehensive metabolic panel (Cmet) - [facility]    Standing Status: Future     Number of Occurrences:      Standing Expiration Date: 10/18/2015   The patient has a good understanding of the overall plan. she agrees with it. she will call with any problems that may develop before the next visit here.   Rulon Eisenmenger, MD

## 2014-12-19 DIAGNOSIS — J209 Acute bronchitis, unspecified: Secondary | ICD-10-CM | POA: Insufficient documentation

## 2015-02-03 HISTORY — PX: BREAST EXCISIONAL BIOPSY: SUR124

## 2015-02-28 DIAGNOSIS — F339 Major depressive disorder, recurrent, unspecified: Secondary | ICD-10-CM | POA: Insufficient documentation

## 2015-04-18 ENCOUNTER — Telehealth: Payer: Self-pay | Admitting: Hematology and Oncology

## 2015-04-18 ENCOUNTER — Ambulatory Visit (HOSPITAL_BASED_OUTPATIENT_CLINIC_OR_DEPARTMENT_OTHER): Payer: Medicare HMO | Admitting: Hematology and Oncology

## 2015-04-18 ENCOUNTER — Ambulatory Visit: Payer: Medicare HMO

## 2015-04-18 ENCOUNTER — Other Ambulatory Visit (HOSPITAL_BASED_OUTPATIENT_CLINIC_OR_DEPARTMENT_OTHER): Payer: Medicare HMO

## 2015-04-18 ENCOUNTER — Encounter: Payer: Self-pay | Admitting: Hematology and Oncology

## 2015-04-18 VITALS — BP 143/63 | HR 72 | Temp 97.2°F | Resp 18 | Ht 68.0 in | Wt 196.4 lb

## 2015-04-18 DIAGNOSIS — M810 Age-related osteoporosis without current pathological fracture: Secondary | ICD-10-CM

## 2015-04-18 DIAGNOSIS — C773 Secondary and unspecified malignant neoplasm of axilla and upper limb lymph nodes: Secondary | ICD-10-CM | POA: Diagnosis not present

## 2015-04-18 DIAGNOSIS — C50312 Malignant neoplasm of lower-inner quadrant of left female breast: Secondary | ICD-10-CM

## 2015-04-18 DIAGNOSIS — Z170 Estrogen receptor positive status [ER+]: Secondary | ICD-10-CM

## 2015-04-18 LAB — CBC WITH DIFFERENTIAL/PLATELET
BASO%: 0.9 % (ref 0.0–2.0)
BASOS ABS: 0.1 10*3/uL (ref 0.0–0.1)
EOS%: 2.4 % (ref 0.0–7.0)
Eosinophils Absolute: 0.1 10*3/uL (ref 0.0–0.5)
HEMATOCRIT: 42.1 % (ref 34.8–46.6)
HEMOGLOBIN: 13.7 g/dL (ref 11.6–15.9)
LYMPH#: 1.9 10*3/uL (ref 0.9–3.3)
LYMPH%: 32.6 % (ref 14.0–49.7)
MCH: 28.8 pg (ref 25.1–34.0)
MCHC: 32.4 g/dL (ref 31.5–36.0)
MCV: 88.7 fL (ref 79.5–101.0)
MONO#: 0.6 10*3/uL (ref 0.1–0.9)
MONO%: 9.7 % (ref 0.0–14.0)
NEUT#: 3.2 10*3/uL (ref 1.5–6.5)
NEUT%: 54.4 % (ref 38.4–76.8)
PLATELETS: 207 10*3/uL (ref 145–400)
RBC: 4.75 10*6/uL (ref 3.70–5.45)
RDW: 14.3 % (ref 11.2–14.5)
WBC: 5.9 10*3/uL (ref 3.9–10.3)

## 2015-04-18 LAB — COMPREHENSIVE METABOLIC PANEL
ALBUMIN: 3.3 g/dL — AB (ref 3.5–5.0)
ALK PHOS: 80 U/L (ref 40–150)
ALT: 14 U/L (ref 0–55)
ANION GAP: 7 meq/L (ref 3–11)
AST: 13 U/L (ref 5–34)
BILIRUBIN TOTAL: 0.39 mg/dL (ref 0.20–1.20)
BUN: 14.6 mg/dL (ref 7.0–26.0)
CALCIUM: 9.8 mg/dL (ref 8.4–10.4)
CO2: 30 mEq/L — ABNORMAL HIGH (ref 22–29)
CREATININE: 0.8 mg/dL (ref 0.6–1.1)
Chloride: 107 mEq/L (ref 98–109)
EGFR: 72 mL/min/{1.73_m2} — ABNORMAL LOW (ref >90)
Glucose: 130 mg/dl (ref 70–140)
Potassium: 3.5 mEq/L (ref 3.5–5.1)
Sodium: 143 mEq/L (ref 136–145)
Total Protein: 6.8 g/dL (ref 6.4–8.3)

## 2015-04-18 MED ORDER — LIRAGLUTIDE 18 MG/3ML ~~LOC~~ SOPN
18.0000 mg | PEN_INJECTOR | Freq: Every morning | SUBCUTANEOUS | Status: DC
Start: 2015-04-18 — End: 2017-12-06

## 2015-04-18 NOTE — Progress Notes (Signed)
Patient Care Team: Renato Shin, MD as PCP - General (Endocrinology) Neldon Mc, MD as Surgeon (General Surgery)  DIAGNOSIS: Primary cancer of lower-inner quadrant of left female breast [facility])   Staging form: Breast, AJCC 7th Edition     Clinical: Stage IIB (T3, N0, cM0) - Signed by Haywood Lasso, MD on 03/05/2011       Prognostic indicators: ER 100%  PR 100%  Ki67 18%  Her2 neg 1.31      Pathologic: Stage IIIA (T3, N2a, cM0) - Signed by Haywood Lasso, MD on 03/25/2011       Prognostic indicators: ER 100%  PR 100%  Ki67 18%  Her2 neg 1.31    SUMMARY OF ONCOLOGIC HISTORY:   Primary cancer of lower-inner quadrant of left female breast ([place])   02/20/2011 Initial Diagnosis Left Breast invasive lobular carcinoma; 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 Her clinical staging was stage IIB (T3, N0, M0).     03/23/2011 Surgery L mastectomy + Axillary LND 6 cm ILC with LVI, PNI; 9/16 LN positive with Extracapsular ext ER 100%; PR 100%; Her 2 1.31; Ki 67: 18; pT3a N2a (stage IIIA)   04/17/2011 - 06/19/2011 Chemotherapy FEC X 4 complicated by febrile neutropenis, spesis and chest wall abscess   10/07/2011 - 11/24/2011 Radiation Therapy Left chest wall and axilla 5040 cGy in 28 # with Boost   10/16/2011 -  Anti-estrogen oral therapy Letrozole 2.5 mg po daily    CHIEF COMPLIANT: Follow-up on letrozole  INTERVAL HISTORY: [name] is a 75 year old with above-mentioned history of left breast cancer treated with adjuvant chemotherapy and radiation currently on letrozole therapy. She appears to be tolerating it fairly well. She denies any hot flashes or myalgias. She was found to have osteoporosis and was started on Prolia. She does take calcium and vitamin D. She reports that her health has been relatively good.  REVIEW OF SYSTEMS:   Constitutional: Denies fevers, chills or abnormal weight loss Eyes: Denies blurriness of vision Ears, nose,  mouth, throat, and face: Denies mucositis or sore throat Respiratory: Denies cough, dyspnea or wheezes Cardiovascular: Denies palpitation, chest discomfort Gastrointestinal:  Denies nausea, heartburn or change in bowel habits Skin: Denies abnormal skin rashes Lymphatics: Denies new lymphadenopathy or easy bruising Neurological:Denies numbness, tingling or new weaknesses Behavioral/Psych: Mood is stable, no new changes  Extremities: No lower extremity edema Breast:  denies any pain or lumps or nodules in either breasts All other systems were reviewed with the patient and are negative.  I have reviewed the past medical history, past surgical history, social history and family history with the patient and they are unchanged from previous note.  ALLERGIES:  is allergic to cephalexin; clindamycin; dilaudid; oxycodone-acetaminophen; rocephin; and sulfonamide derivatives.  MEDICATIONS:  Current Outpatient Prescriptions  Medication Sig Dispense Refill  . acyclovir (ZOVIRAX) 200 MG capsule TAKE 2 CAPSULES BY MOUTH 3 TIMES DAILY 540 capsule 3  . albuterol (PROVENTIL) (2.5 MG/3ML) 0.083% nebulizer solution     . ALPRAZolam (XANAX) 0.5 MG tablet TAKE ONE-HALF TO ONE TABLET BY MOUTH THREE TIMES DAILY AS NEEDED FOR  NERVES 270 tablet 1  . amLODipine (NORVASC) 10 MG tablet Take 1 tablet (10 mg total) by mouth daily. 90 tablet 3  . atorvastatin (LIPITOR) 40 MG tablet Take 1 tablet (40 mg total) by mouth daily. 90 tablet 3  . Blood Glucose Monitoring Suppl (EASY TALK BLOOD GLUCOSE SYSTEM) DEVI Use once daily as directed to check blood glucose    .  chlorpheniramine-HYDROcodone (TUSSIONEX) 10-8 MG/5ML LQCR Take 1/2 to 1 tsp at bedtime for severe cough    . cholecalciferol (VITAMIN D) 1000 UNITS tablet Take 1 tablet (1,000 Units total) by mouth daily. 90 tablet 3  . cyanocobalamin 500 MCG tablet Take 1 tablet (500 mcg total) by mouth daily. 90 tablet 3  . EASY TALK BLOOD GLUCOSE TEST test strip Use to check  blood glucose once daily as directed    . Insulin Glargine (LANTUS) 100 UNIT/ML Solostar Pen Inject 55 Units into the skin at bedtime. 60 mL 3  . Insulin Pen Needle (RELION PEN NEEDLE 31G/8MM) 31G X 8 MM MISC 1 Device by Does not apply route 4 (four) times daily. 120 each 11  . letrozole (FEMARA) 2.5 MG tablet TAKE ONE TABLET BY MOUTH DAILY. 90 tablet 12  . losartan (COZAAR) 100 MG tablet Take 1 tablet (100 mg total) by mouth daily. 90 tablet 3  . metFORMIN (GLUCOPHAGE) 500 MG tablet TAKE ONE TABLET BID    . Multiple Vitamins-Minerals (ONE-A-DAY WOMENS 50+ ADVANTAGE) TABS Take 1 tablet by mouth daily. 90 tablet 3  . omeprazole (PRILOSEC) 20 MG capsule Take 1 capsule (20 mg total) by mouth daily. 90 capsule 3  . sertraline (ZOLOFT) 50 MG tablet Take 1 tablet (50 mg total) by mouth daily. 90 tablet 3  . tolterodine (DETROL LA) 4 MG 24 hr capsule Take 4 mg by mouth.    . [DISCONTINUED] prochlorperazine (COMPAZINE) 10 MG tablet Take 1 tablet (10 mg total) by mouth every 6 (six) hours as needed (Nausea or vomiting). 30 tablet 1   No current facility-administered medications for this visit.    PHYSICAL EXAMINATION: ECOG PERFORMANCE STATUS: 1 - Symptomatic but completely ambulatory  Filed Vitals:   04/18/15 1334  BP: 143/63  Pulse: 72  Temp: 97.2 F (36.2 C)  Resp: 18   Filed Weights   04/18/15 1334  Weight: 196 lb 6.4 oz (89.086 kg)    GENERAL:alert, no distress and comfortable SKIN: skin color, texture, turgor are normal, no rashes or significant lesions EYES: normal, Conjunctiva are pink and non-injected, sclera clear OROPHARYNX:no exudate, no erythema and lips, buccal mucosa, and tongue normal  NECK: supple, thyroid normal size, non-tender, without nodularity LYMPH:  no palpable lymphadenopathy in the cervical, axillary or inguinal LUNGS: clear to auscultation and percussion with normal breathing effort HEART: regular rate & rhythm and no murmurs and no lower extremity  edema ABDOMEN:abdomen soft, non-tender and normal bowel sounds MUSCULOSKELETAL:no cyanosis of digits and no clubbing  NEURO: alert & oriented x 3 with fluent speech, no focal motor/sensory deficits EXTREMITIES: No lower extremity edema BRELeft mastectomy, no palpable lumps or nodules, right breast is without any nodularity no axillary lymphadenopathy.exam performed in the presence of a chaperone)  LABORATORY DATA:  I have reviewed the data as listed   Chemistry      Component Value Date/Time   NA 143 04/18/2015 1316   NA 137 09/12/2013 1104   K 3.5 04/18/2015 1316   K 4.3 09/12/2013 1104   CL 99 09/12/2013 1104   CL 102 02/25/2012 0908   CO2 30* 04/18/2015 1316   CO2 29 09/12/2013 1104   BUN 14.6 04/18/2015 1316   BUN 15 09/12/2013 1104   CREATININE 0.8 04/18/2015 1316   CREATININE 0.79 09/12/2013 1104      Component Value Date/Time   CALCIUM 9.8 04/18/2015 1316   CALCIUM 9.5 09/12/2013 1104   ALKPHOS 80 04/18/2015 1316   ALKPHOS 121* 09/12/2013 1104  AST 13 04/18/2015 1316   AST 19 09/12/2013 1104   ALT 14 04/18/2015 1316   ALT 22 09/12/2013 1104   BILITOT 0.39 04/18/2015 1316   BILITOT 0.5 09/12/2013 1104       Lab Results  Component Value Date   WBC 5.9 04/18/2015   HGB 13.7 04/18/2015   HCT 42.1 04/18/2015   MCV 88.7 04/18/2015   PLT 207 04/18/2015   NEUTROABS 3.2 04/18/2015   ASSESSMENT & PLAN:  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 Hot flashes but not very severe 2. Occasional chest tightness  Breast cancer surveillance: 1.  Mammogram 09/07/2014 right breast was normal, breast density category C 2. Chest wall and breast exam 10/18/2014 is normal 3. Bone density 03/06/2013 showed a T score of -2.1 Boniva was not covered by her insurance and we switched her to Prolia since March 2016. Patient will need another bone density test  Return to clinic in 6 months for Prolia follow-up   No orders of the defined types were placed in this encounter.   The patient has a good understanding of the overall plan. she agrees with it. she will call with any problems that may develop before the next visit here.   Rulon Eisenmenger, MD 04/18/2015

## 2015-04-18 NOTE — Telephone Encounter (Signed)
Gave and printed appt sched and avs for pt for March 2018

## 2015-04-18 NOTE — Assessment & Plan Note (Signed)
Left breast invasive lobular cancer diagnosed 02/20/2011 status post left mastectomy with axillary lymph node dissection, 6 cm ILC with LVI, PNI, 9/16 lymph nodes positive with extracapsular extension, ER 100%, PR 100%, HER-2 -1.31, Ki-67 18%, T3a N2 a M0 stage IIIa status post F AC chemotherapy 4 followed by radiation and currently on letrozole 2.5 mg daily since 10/16/2011  Letrozole toxicities: 1. Hot flashes but not very severe 2. Occasional chest tightness  Breast cancer surveillance: 1. Mammogram 09/07/2014 right breast was normal, breast density category C 2. Chest wall and breast exam 10/18/2014 is normal 3. Bone density 03/06/2013 showed a T score of -2.1 Boniva was not covered by her insurance and we switched her to Prolia since March 2016. Patient will need another bone density test  Return to clinic in 6 months for Prolia follow-up

## 2015-05-27 ENCOUNTER — Emergency Department (HOSPITAL_COMMUNITY): Payer: Medicare HMO

## 2015-05-27 ENCOUNTER — Encounter (HOSPITAL_COMMUNITY): Payer: Self-pay

## 2015-05-27 ENCOUNTER — Observation Stay (HOSPITAL_COMMUNITY)
Admission: EM | Admit: 2015-05-27 | Discharge: 2015-05-30 | Disposition: A | Discharge status: Home / Self Care | Payer: Medicare HMO | Attending: Internal Medicine | Admitting: Internal Medicine

## 2015-05-27 DIAGNOSIS — Z87891 Personal history of nicotine dependence: Secondary | ICD-10-CM | POA: Insufficient documentation

## 2015-05-27 DIAGNOSIS — Z6830 Body mass index (BMI) 30.0-30.9, adult: Secondary | ICD-10-CM | POA: Insufficient documentation

## 2015-05-27 DIAGNOSIS — R0602 Shortness of breath: Secondary | ICD-10-CM | POA: Insufficient documentation

## 2015-05-27 DIAGNOSIS — E119 Type 2 diabetes mellitus without complications: Secondary | ICD-10-CM | POA: Insufficient documentation

## 2015-05-27 DIAGNOSIS — F329 Major depressive disorder, single episode, unspecified: Secondary | ICD-10-CM | POA: Diagnosis not present

## 2015-05-27 DIAGNOSIS — I110 Hypertensive heart disease with heart failure: Secondary | ICD-10-CM | POA: Diagnosis not present

## 2015-05-27 DIAGNOSIS — E7800 Pure hypercholesterolemia, unspecified: Secondary | ICD-10-CM | POA: Diagnosis not present

## 2015-05-27 DIAGNOSIS — K219 Gastro-esophageal reflux disease without esophagitis: Secondary | ICD-10-CM | POA: Diagnosis not present

## 2015-05-27 DIAGNOSIS — R079 Chest pain, unspecified: Secondary | ICD-10-CM | POA: Diagnosis present

## 2015-05-27 DIAGNOSIS — R9439 Abnormal result of other cardiovascular function study: Secondary | ICD-10-CM | POA: Diagnosis not present

## 2015-05-27 DIAGNOSIS — R0789 Other chest pain: Secondary | ICD-10-CM | POA: Diagnosis not present

## 2015-05-27 DIAGNOSIS — F419 Anxiety disorder, unspecified: Secondary | ICD-10-CM | POA: Diagnosis not present

## 2015-05-27 DIAGNOSIS — Z9221 Personal history of antineoplastic chemotherapy: Secondary | ICD-10-CM | POA: Insufficient documentation

## 2015-05-27 DIAGNOSIS — I5021 Acute systolic (congestive) heart failure: Secondary | ICD-10-CM | POA: Diagnosis not present

## 2015-05-27 DIAGNOSIS — I420 Dilated cardiomyopathy: Secondary | ICD-10-CM | POA: Diagnosis not present

## 2015-05-27 DIAGNOSIS — Z79811 Long term (current) use of aromatase inhibitors: Secondary | ICD-10-CM | POA: Diagnosis not present

## 2015-05-27 DIAGNOSIS — J45909 Unspecified asthma, uncomplicated: Secondary | ICD-10-CM | POA: Diagnosis not present

## 2015-05-27 DIAGNOSIS — C50312 Malignant neoplasm of lower-inner quadrant of left female breast: Secondary | ICD-10-CM | POA: Diagnosis present

## 2015-05-27 DIAGNOSIS — I10 Essential (primary) hypertension: Secondary | ICD-10-CM

## 2015-05-27 DIAGNOSIS — Z79899 Other long term (current) drug therapy: Secondary | ICD-10-CM | POA: Diagnosis not present

## 2015-05-27 DIAGNOSIS — E669 Obesity, unspecified: Secondary | ICD-10-CM | POA: Diagnosis not present

## 2015-05-27 DIAGNOSIS — Z853 Personal history of malignant neoplasm of breast: Secondary | ICD-10-CM | POA: Diagnosis not present

## 2015-05-27 DIAGNOSIS — Z9012 Acquired absence of left breast and nipple: Secondary | ICD-10-CM | POA: Insufficient documentation

## 2015-05-27 DIAGNOSIS — Z794 Long term (current) use of insulin: Secondary | ICD-10-CM | POA: Insufficient documentation

## 2015-05-27 HISTORY — DX: Type 2 diabetes mellitus without complications: E11.9

## 2015-05-27 HISTORY — DX: Dilated cardiomyopathy: I42.0

## 2015-05-27 LAB — BASIC METABOLIC PANEL
Anion gap: 11 (ref 5–15)
BUN: 12 mg/dL (ref 6–20)
CALCIUM: 9.5 mg/dL (ref 8.9–10.3)
CO2: 27 mmol/L (ref 22–32)
Chloride: 103 mmol/L (ref 101–111)
Creatinine, Ser: 0.8 mg/dL (ref 0.44–1.00)
GFR calc Af Amer: >60 mL/min (ref >60)
GLUCOSE: 162 mg/dL — AB (ref 65–99)
POTASSIUM: 3.7 mmol/L (ref 3.5–5.1)
SODIUM: 141 mmol/L (ref 135–145)

## 2015-05-27 LAB — CBC
HCT: 42.9 % (ref 36.0–46.0)
Hemoglobin: 13.8 g/dL (ref 12.0–15.0)
MCH: 29 pg (ref 26.0–34.0)
MCHC: 32.2 g/dL (ref 30.0–36.0)
MCV: 90.1 fL (ref 78.0–100.0)
Platelets: 203 10*3/uL (ref 150–400)
RBC: 4.76 MIL/uL (ref 3.87–5.11)
RDW: 13.6 % (ref 11.5–15.5)
WBC: 6.2 10*3/uL (ref 4.0–10.5)

## 2015-05-27 LAB — I-STAT TROPONIN, ED: TROPONIN I, POC: 0 ng/mL (ref 0.00–0.08)

## 2015-05-27 LAB — TROPONIN I

## 2015-05-27 LAB — GLUCOSE, CAPILLARY: GLUCOSE-CAPILLARY: 108 mg/dL — AB (ref 65–99)

## 2015-05-27 MED ORDER — SERTRALINE HCL 50 MG PO TABS
50.0000 mg | ORAL_TABLET | Freq: Every day | ORAL | Status: DC
  Administered 2015-05-27 – 2015-05-30 (×4): 50 mg via ORAL
  Filled 2015-05-27 (×4): qty 1

## 2015-05-27 MED ORDER — LETROZOLE 2.5 MG PO TABS
2.5000 mg | ORAL_TABLET | Freq: Every day | ORAL | Status: DC
  Administered 2015-05-27 – 2015-05-30 (×4): 2.5 mg via ORAL
  Filled 2015-05-27 (×4): qty 1

## 2015-05-27 MED ORDER — NITROGLYCERIN 0.4 MG SL SUBL
SUBLINGUAL_TABLET | SUBLINGUAL | Status: AC
  Administered 2015-05-27: 0.4 mg
  Filled 2015-05-27: qty 1

## 2015-05-27 MED ORDER — ADULT MULTIVITAMIN W/MINERALS CH
1.0000 | ORAL_TABLET | Freq: Every day | ORAL | Status: DC
Start: 2015-05-27 — End: 2015-05-30
  Administered 2015-05-28 – 2015-05-30 (×3): 1 via ORAL
  Filled 2015-05-27 (×5): qty 1

## 2015-05-27 MED ORDER — ACETAMINOPHEN 325 MG PO TABS
650.0000 mg | ORAL_TABLET | ORAL | Status: DC | PRN
  Administered 2015-05-29: 650 mg via ORAL
  Filled 2015-05-27: qty 2

## 2015-05-27 MED ORDER — ENOXAPARIN SODIUM 40 MG/0.4ML ~~LOC~~ SOLN
40.0000 mg | SUBCUTANEOUS | Status: DC
  Administered 2015-05-27 – 2015-05-28 (×2): 40 mg via SUBCUTANEOUS
  Filled 2015-05-27 (×2): qty 0.4

## 2015-05-27 MED ORDER — ATORVASTATIN CALCIUM 40 MG PO TABS
40.0000 mg | ORAL_TABLET | Freq: Every day | ORAL | Status: DC
  Administered 2015-05-27 – 2015-05-29 (×3): 40 mg via ORAL
  Filled 2015-05-27 (×3): qty 1

## 2015-05-27 MED ORDER — ACYCLOVIR 200 MG PO CAPS
200.0000 mg | ORAL_CAPSULE | Freq: Three times a day (TID) | ORAL | Status: DC
  Administered 2015-05-27 – 2015-05-30 (×7): 200 mg via ORAL
  Filled 2015-05-27 (×8): qty 1

## 2015-05-27 MED ORDER — INSULIN ASPART 100 UNIT/ML ~~LOC~~ SOLN
0.0000 [IU] | Freq: Three times a day (TID) | SUBCUTANEOUS | Status: DC
  Administered 2015-05-28: 1 [IU] via SUBCUTANEOUS
  Administered 2015-05-29: 3 [IU] via SUBCUTANEOUS

## 2015-05-27 MED ORDER — AMLODIPINE BESYLATE 10 MG PO TABS
10.0000 mg | ORAL_TABLET | Freq: Every day | ORAL | Status: DC
  Administered 2015-05-28 – 2015-05-30 (×3): 10 mg via ORAL
  Filled 2015-05-27 (×3): qty 1

## 2015-05-27 MED ORDER — ONDANSETRON HCL 4 MG/2ML IJ SOLN: 4.0000 mg | Freq: Four times a day (QID) | INTRAMUSCULAR | Status: DC | PRN

## 2015-05-27 MED ORDER — NITROGLYCERIN 0.4 MG SL SUBL: 0.4000 mg | SUBLINGUAL_TABLET | SUBLINGUAL | Status: DC | PRN

## 2015-05-27 MED ORDER — LOSARTAN POTASSIUM 50 MG PO TABS
100.0000 mg | ORAL_TABLET | Freq: Every day | ORAL | Status: DC
  Administered 2015-05-27 – 2015-05-30 (×4): 100 mg via ORAL
  Filled 2015-05-27 (×4): qty 2

## 2015-05-27 MED ORDER — ACYCLOVIR 400 MG PO TABS
200.0000 mg | ORAL_TABLET | Freq: Three times a day (TID) | ORAL | Status: DC
  Filled 2015-05-27: qty 0.5

## 2015-05-27 MED ORDER — ALPRAZOLAM 0.25 MG PO TABS: 0.2500 mg | ORAL_TABLET | Freq: Three times a day (TID) | ORAL | Status: DC | PRN

## 2015-05-27 MED ORDER — INSULIN GLARGINE 100 UNIT/ML ~~LOC~~ SOLN
30.0000 [IU] | Freq: Every day | SUBCUTANEOUS | Status: DC
  Administered 2015-05-27 – 2015-05-29 (×3): 30 [IU] via SUBCUTANEOUS
  Filled 2015-05-27 (×5): qty 0.3

## 2015-05-27 MED ORDER — PANTOPRAZOLE SODIUM 40 MG PO TBEC: 40.0000 mg | DELAYED_RELEASE_TABLET | Freq: Every day | ORAL | Status: DC

## 2015-05-27 MED ORDER — ALBUTEROL SULFATE (2.5 MG/3ML) 0.083% IN NEBU: 2.5000 mg | INHALATION_SOLUTION | Freq: Four times a day (QID) | RESPIRATORY_TRACT | Status: DC | PRN

## 2015-05-27 NOTE — ED Provider Notes (Signed)
CSN: [id number]     Arrival date & time 05/27/15  1257 History   First MD Initiated Contact with Patient 05/27/15 1336     Chief Complaint  Patient presents with  . Chest Pain     (Consider location/radiation/quality/duration/timing/severity/associated sxs/prior Treatment) HPI [name] is a 75 y.o. female with a history of hypertension, hyperlipidemia, diabetes, breast cancer 2013 status post left mastectomy, comes in for evaluation of chest pain. Patient reports her doctor called her this morning at 8:30 and told her that her cholesterol was high, she reports shortly thereafter she began to experience a central chest pain that was sharp in nature, radiated into her left chest and left shoulder. Episode lasted approximately 15-20 minutes and resolved spontaneously. She reports this has been cyclical with periods of no pain lasting 10 or 15 minutes and then the sharp pain returning. She denies any associated diaphoresis, nausea or vomiting, numbness or weakness, shortness of breath. No leg swelling, fevers or chills, cough, abdominal pain or back pain or other medical complaints. Reports that 10:00 she took 3 baby aspirin and that seemed to help. Nonsmoker. No other modifying factors.  Past Medical History  Diagnosis Date  . DIABETES MELLITUS 08/10/2007  . 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 HYPERCHOLESTEROLEMIA 12/17/2006  . OBESITY 06/04/2008  . ANXIETY 06/04/2008  . DEPRESSION 08/10/2007  . HYPERTENSION 12/17/2006  . ASTHMATIC BRONCHITIS, ACUTE 06/04/2008  . ALLERGIC RHINITIS 12/13/2007  . GERD 12/17/2006  . DEGENERATIVE JOINT DISEASE 12/17/2006  . Asthma   . Blood transfusion 1964    post childbirth  . 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 History of chemotherapy   . History of radiation therapy 10/07/11-11/24/11    left breast,5040 cGy/28 sessions,l chest wall boost=1000cGy/5 sesions  . Osteopenia due to cancer therapy 09/12/2013   Past Surgical  History  Procedure Laterality Date  . Laminectomy  1993    s/p-Dr. Rita Ohara  . Bladder tack  1974  . Cholecystectomy  02/23/1991    [facility] - Dr Margot Chimes  . Tonsillectomy    . Rotator cuff repair  8/03    right, Dr. Tonita Cong  . Enterocele repair  06/08    Dr. Cletis Media  . Rotator cuff repair  10/26/2008    left, Dr. Rush Farmer  . Hip surgery  08/27/2010    DR. Maureen Ralphs  . Breast excisional biopsy  11/10/1988    left benign Dr Margot Chimes  . Breast excisional biopsy  03/04/1983    Right - two areas - Dr Margot Chimes  . Breast excisional biopsy  10/09/1980    right - Dr Margot Chimes  . Breast excisional biopsy  10/18/1979    left - Dr Margot Chimes  . Breast excisional biopsy  01/02/1994    right - Dr Margot Chimes  . Vaginal hysterectomy  1974    partial   . Colonoscopy    . Modified radical mastectomy w/ axillary lymph node dissection      03-30-11 LT  . Portacath placement  04/13/2011    Procedure: INSERTION PORT-A-CATH;  Surgeon: Haywood Lasso, MD;  Location: [facility];  Service: General;  Laterality: N/A;  porta cath placement,removal of J-P drain  . Mastectomy  03/23/2011    left  . Drain seroma  05/01/11    Chronic seroma @mastectomy  site  . Evacuation breast hematoma  07/01/2011    Procedure: EVACUATION HEMATOMA BREAST;  Surgeon: Haywood Lasso, MD;  Location: [facility];  Service: General;  Laterality: Left;  Drainage of left mastectomy seroma  . Breast surgery    . Port-a-cath removal  12/02/2011    Procedure: REMOVAL PORT-A-CATH;  Surgeon: Haywood Lasso, MD;  Location: [facility];  Service: General;  Laterality: N/A;   Family History  Problem Relation Age of Onset  . COPD Mother   . Arthritis Mother   . Emphysema Mother   . Mental illness Paternal Grandfather   . Heart disease Other     Sibling  . Diabetes Other     Sibling   . Diabetes Brother   . Cancer Brother     prostate  . Cancer Father     malignant brain tumor   Social History  Substance Use Topics  .  Smoking status: Never Smoker   . Smokeless tobacco: Never Used  . Alcohol Use: No   OB History    Gravida Para Term Preterm AB TAB SAB Ectopic Multiple Living   5 3        3      Review of Systems A 10 point review of systems was completed and was negative except for pertinent positives and negatives as mentioned in the history of present illness     Allergies  Cephalexin; Clindamycin; Dilaudid; Oxycodone-acetaminophen; Rocephin; and Sulfonamide derivatives  Home Medications   Prior to Admission medications   Medication Sig Start Date End Date Taking? Authorizing Provider  acyclovir (ZOVIRAX) 200 MG capsule TAKE 2 CAPSULES BY MOUTH 3 TIMES DAILY 10/23/13   Noralee Space, MD  albuterol (PROVENTIL) (2.5 MG/3ML) 0.083% nebulizer solution  02/05/14   Historical Provider, MD  ALPRAZolam Duanne Moron) 0.5 MG tablet TAKE ONE-HALF TO ONE TABLET BY MOUTH THREE TIMES DAILY AS NEEDED FOR  NERVES 02/23/13   Noralee Space, MD  amLODipine (NORVASC) 10 MG tablet Take 1 tablet (10 mg total) by mouth daily. 02/23/13   Noralee Space, MD  atorvastatin (LIPITOR) 40 MG tablet Take 1 tablet (40 mg total) by mouth daily. 02/23/13 03/27/15  Noralee Space, MD  Blood Glucose Monitoring Suppl (EASY TALK BLOOD GLUCOSE SYSTEM) DEVI Use once daily as directed to check blood glucose 04/13/12   Historical Provider, MD  chlorpheniramine-HYDROcodone ([place]) 10-8 MG/5ML LQCR Take 1/2 to 1 tsp at bedtime for severe cough 02/15/14   Historical Provider, MD  cholecalciferol (VITAMIN D) 1000 UNITS tablet Take 1 tablet (1,000 Units total) by mouth daily. 02/23/13   Noralee Space, MD  cyanocobalamin 500 MCG tablet Take 1 tablet (500 mcg total) by mouth daily. 02/23/13   Noralee Space, MD  EASY TALK BLOOD GLUCOSE TEST test strip Use to check blood glucose once daily as directed 03/26/12   Historical Provider, MD  Insulin Glargine (LANTUS) 100 UNIT/ML Solostar Pen Inject 55 Units into the skin at bedtime. 03/02/13   Renato Shin, MD  Insulin  Pen Needle (RELION PEN NEEDLE 31G/8MM) 31G X 8 MM MISC 1 Device by Does not apply route 4 (four) times daily. 10/05/12   Renato Shin, MD  letrozole (FEMARA) 2.5 MG tablet TAKE ONE TABLET BY MOUTH DAILY. 08/25/12   Consuela Mimes, MD  Liraglutide (VICTOZA) 18 MG/3ML SOPN Inject 3 mLs (18 mg total) into the skin every morning. 04/18/15   Nicholas Lose, MD  losartan (COZAAR) 100 MG tablet Take 1 tablet (100 mg total) by mouth daily. 02/23/13   Noralee Space, MD  metFORMIN (GLUCOPHAGE) 500 MG tablet TAKE ONE TABLET BID 06/20/14  Historical Provider, MD  Multiple Vitamins-Minerals (ONE-A-DAY WOMENS 50+ ADVANTAGE) TABS Take 1 tablet by mouth daily. 02/23/13   Noralee Space, MD  omeprazole (PRILOSEC) 20 MG capsule Take 1 capsule (20 mg total) by mouth daily. 02/23/13   Noralee Space, MD  sertraline (ZOLOFT) 50 MG tablet Take 1 tablet (50 mg total) by mouth daily. 02/23/13   Noralee Space, MD  tolterodine (DETROL LA) 4 MG 24 hr capsule Take 4 mg by mouth. 06/20/14 06/20/15  Historical Provider, MD   BP 118/71 mmHg  Pulse 56  Temp(Src) 98.1 F (36.7 C) (Oral)  Resp 19  SpO2 99% Physical Exam  Constitutional: She is oriented to person, place, and time. She appears well-developed and well-nourished. No distress.  HENT:  Head: Normocephalic and atraumatic.  Mouth/Throat: Oropharynx is clear and moist.  Eyes: Conjunctivae are normal. Pupils are equal, round, and reactive to light. Right eye exhibits no discharge. Left eye exhibits no discharge. No scleral icterus.  Neck: Normal range of motion. Neck supple.  Cardiovascular: Normal rate, regular rhythm and normal heart sounds.   Pulmonary/Chest: Effort normal and breath sounds normal. No respiratory distress. She has no wheezes. She has no rales. She exhibits tenderness.  Tenderness to palpation of her left pectoralis major muscle replicating sharp stabbing pain.  Abdominal: Soft. She exhibits no distension and no mass. There is no tenderness. There is no rebound  and no guarding.  Musculoskeletal: Normal range of motion. She exhibits no edema or tenderness.  Neurological: She is alert and oriented to person, place, and time.  Cranial Nerves II-XII grossly intact  Skin: Skin is warm and dry. No rash noted. She is not diaphoretic.  Psychiatric: She has a normal mood and affect.  Nursing note and vitals reviewed.   ED Course  Procedures (including critical care time) Labs Review Labs Reviewed  BASIC METABOLIC PANEL - Abnormal; Notable for the following:    Glucose, Bld 162 (*)    All other components within normal limits  CBC  I-STAT TROPOININ, ED    Imaging Review Dg Chest 2 View  05/27/2015  CLINICAL DATA:  Chest pain today EXAM: CHEST  2 VIEW COMPARISON:  03/15/2012 FINDINGS: The heart is upper normal in size. Lungs are clear. No pneumothorax or pleural effusion. Normal vascularity. IMPRESSION: Borderline cardiomegaly without decompensation. Electronically Signed   By: Marybelle Killings M.D.   On: 05/27/2015 13:50   I have personally reviewed and evaluated these images and lab results as part of my medical decision-making.   EKG Interpretation   Date/Time:  Monday May 27 2015 13:03:41 EDT Ventricular Rate:  77 PR Interval:  144 QRS Duration: 96 QT Interval:  360 QTC Calculation: 407 R Axis:   -31 Text Interpretation:  Sinus rhythm with occasional Premature ventricular  complexes Left axis deviation Septal infarct , age undetermined Abnormal  ECG Confirmed by Hazle Coca [phone]) on 05/27/2015 1:38:38 PM     Meds given in ED:  Medications - No data to display  New Prescriptions   No medications on file   Filed Vitals:   05/27/15 1304 05/27/15 1400 05/27/15 1445 05/27/15 1530  BP: 106/63 118/59 126/64 118/71  Pulse: 72 77 61 56  Temp: 98.1 F (36.7 C)     TempSrc: Oral     Resp: 18 23 21 19   SpO2: 95% 95% 97% 99%    MDM  [name] is a 75 y.o. female history of hypertension, hyperlipidemia,  diabetes-insulin-dependent, breast cancer in 2013  status post mastectomy, comes in for evaluation of chest pain that started at 8:30 AM after hearing from her doctor that she had high cholesterol. No chest pain now in the emergency department. Discomfort is replicated with outpatient of left chest wall, however patient has multiple risk factors and heart score is 5. Initial troponin is negative, chest x-ray shows cardiomegaly with no other active disease, screening labs are negative. Given patient's risk factors and presentation, she would likely benefit from medical admission for observation and cardiac rule out. Plan to consult hospitalist. Discussed with my attending, Dr. Ralene Bathe, who also saw and evaluated the patient and agrees with plan for medical admission. Discussed with Dr.Gherghe, will see patient in ED. Patient admitted to medical service. Final diagnoses:  Chest pain, unspecified chest pain type        Comer Locket, PA-C 05/27/15 [place], MD 05/29/15 [phone]

## 2015-05-27 NOTE — Consult Note (Signed)
CARDIOLOGY CONSULT NOTE   Patient ID: IDELL DUQUAINE MRN: [id number] DOB/AGE: 04/09/1940 75 y.o.  Admit date: 05/27/2015  Primary Physician   Renato Shin, MD Primary Cardiologist: New Reason for Consultation: Chest pain   HPI: Ms. Linares is a 75 year old female with a past medical history of DM, HLD, obesity, HTN, and breast CA.  No prior cardiac history.   She presented to the ED today with chest pain. She was called this morning by her doctor who was calling to tell her that her lipids panel came back with elevated cholesterol.  Shortly after, she began to have chest pain that she describes as sharp with radiation to her left shoulder. It lasted for about 15-20 min and then resolved.  It has been intermittent since. Denies associated SOB, nausea, and diaphoresis. She denies any stress related to the telephone call.   She does note that for the past 4 years she has been SOB with activity.  She cannot climb stairs without stopping for breath.  She cannot preform daily household activities such as laundry and cooking without SOB.   She has had a nuclear stress test in 2013, EF was 65%, not worrisome for ischemia.  Her troponin has been negative x1. EKG shows T wave inversion that is not on previous EKG from 2016. She had an episode of pain while I was in the room.  It was relieved by SL Nitro.  She does have family history of MI, her brother died from MI at the age of 9. She is not a smoker, does not drink alcohol.     Past Medical History  Diagnosis Date  . DIABETES MELLITUS 08/10/2007  . 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 HYPERCHOLESTEROLEMIA 12/17/2006  . OBESITY 06/04/2008  . ANXIETY 06/04/2008  . DEPRESSION 08/10/2007  . HYPERTENSION 12/17/2006  . ASTHMATIC BRONCHITIS, ACUTE 06/04/2008  . ALLERGIC RHINITIS 12/13/2007  . GERD 12/17/2006  . DEGENERATIVE JOINT DISEASE 12/17/2006  . Asthma   . Blood transfusion 1964    post childbirth  . Breast cancer, IXC, Right, receptor +, her 2 neg  02/20/2011    left, ER/PR +, HER 2 -  . History of chemotherapy   . History of radiation therapy 10/07/11-11/24/11    left breast,5040 cGy/28 sessions,l chest wall boost=1000cGy/5 sesions  . Osteopenia due to cancer therapy 09/12/2013     Past Surgical History  Procedure Laterality Date  . Laminectomy  1993    s/p-Dr. Rita Ohara  . Bladder tack  1974  . Cholecystectomy  02/23/1991    [facility] - Dr Margot Chimes  . Tonsillectomy    . Rotator cuff repair  8/03    right, Dr. Tonita Cong  . Enterocele repair  06/08    Dr. Cletis Media  . Rotator cuff repair  10/26/2008    left, Dr. Rush Farmer  . Hip surgery  08/27/2010    DR. Maureen Ralphs  . Breast excisional biopsy  11/10/1988    left benign Dr Margot Chimes  . Breast excisional biopsy  03/04/1983    Right - two areas - Dr Margot Chimes  . Breast excisional biopsy  10/09/1980    right - Dr Margot Chimes  . Breast excisional biopsy  10/18/1979    left - Dr Margot Chimes  . Breast excisional biopsy  01/02/1994    right - Dr Margot Chimes  . Vaginal hysterectomy  1974    partial   . Colonoscopy    . Modified radical mastectomy w/ axillary lymph node dissection  03-30-11 LT  . Portacath placement  04/13/2011    Procedure: INSERTION PORT-A-CATH;  Surgeon: Haywood Lasso, MD;  Location: [facility];  Service: General;  Laterality: N/A;  porta cath placement,removal of J-P drain  . Mastectomy  03/23/2011    left  . Drain seroma  05/01/11    Chronic seroma @mastectomy  site  . Evacuation breast hematoma  07/01/2011    Procedure: EVACUATION HEMATOMA BREAST;  Surgeon: Haywood Lasso, MD;  Location: [facility];  Service: General;  Laterality: Left;  Drainage of left mastectomy seroma  . Breast surgery    . Port-a-cath removal  12/02/2011    Procedure: REMOVAL PORT-A-CATH;  Surgeon: Haywood Lasso, MD;  Location: [facility];  Service: General;  Laterality: N/A;    Allergies  Allergen Reactions  . Cephalexin     REACTION: hives  . Clindamycin     REACTION: hives    . Dilaudid [Hydromorphone Hcl] Itching  . Oxycodone-Acetaminophen     REACTION: hives  . Rocephin [Ceftriaxone Sodium In Dextrose] Hives  . Sulfonamide Derivatives     REACTION: hives    I have reviewed the patient's current medications . enoxaparin (LOVENOX) injection  40 mg Subcutaneous Q24H  . insulin aspart  0-9 Units Subcutaneous TID WC     acetaminophen, ondansetron (ZOFRAN) IV  Prior to Admission medications   Medication Sig Start Date End Date Taking? Authorizing Provider  acyclovir (ZOVIRAX) 200 MG capsule TAKE 2 CAPSULES BY MOUTH 3 TIMES DAILY 10/23/13   Noralee Space, MD  albuterol (PROVENTIL) (2.5 MG/3ML) 0.083% nebulizer solution  02/05/14   Historical Provider, MD  ALPRAZolam Duanne Moron) 0.5 MG tablet TAKE ONE-HALF TO ONE TABLET BY MOUTH THREE TIMES DAILY AS NEEDED FOR  NERVES 02/23/13   Noralee Space, MD  amLODipine (NORVASC) 10 MG tablet Take 1 tablet (10 mg total) by mouth daily. 02/23/13   Noralee Space, MD  atorvastatin (LIPITOR) 40 MG tablet Take 1 tablet (40 mg total) by mouth daily. 02/23/13 03/27/15  Noralee Space, MD  Blood Glucose Monitoring Suppl (EASY TALK BLOOD GLUCOSE SYSTEM) DEVI Use once daily as directed to check blood glucose 04/13/12   Historical Provider, MD  chlorpheniramine-HYDROcodone ([place]) 10-8 MG/5ML LQCR Take 1/2 to 1 tsp at bedtime for severe cough 02/15/14   Historical Provider, MD  cholecalciferol (VITAMIN D) 1000 UNITS tablet Take 1 tablet (1,000 Units total) by mouth daily. 02/23/13   Noralee Space, MD  cyanocobalamin 500 MCG tablet Take 1 tablet (500 mcg total) by mouth daily. 02/23/13   Noralee Space, MD  EASY TALK BLOOD GLUCOSE TEST test strip Use to check blood glucose once daily as directed 03/26/12   Historical Provider, MD  Insulin Glargine (LANTUS) 100 UNIT/ML Solostar Pen Inject 55 Units into the skin at bedtime. 03/02/13   Renato Shin, MD  Insulin Pen Needle (RELION PEN NEEDLE 31G/8MM) 31G X 8 MM MISC 1 Device by Does not apply route 4  (four) times daily. 10/05/12   Renato Shin, MD  letrozole (FEMARA) 2.5 MG tablet TAKE ONE TABLET BY MOUTH DAILY. 08/25/12   Consuela Mimes, MD  Liraglutide (VICTOZA) 18 MG/3ML SOPN Inject 3 mLs (18 mg total) into the skin every morning. 04/18/15   Nicholas Lose, MD  losartan (COZAAR) 100 MG tablet Take 1 tablet (100 mg total) by mouth daily. 02/23/13   Noralee Space, MD  metFORMIN (GLUCOPHAGE) 500 MG tablet TAKE ONE TABLET BID 06/20/14   Historical Provider,  MD  Multiple Vitamins-Minerals (ONE-A-DAY WOMENS 50+ ADVANTAGE) TABS Take 1 tablet by mouth daily. 02/23/13   Noralee Space, MD  omeprazole (PRILOSEC) 20 MG capsule Take 1 capsule (20 mg total) by mouth daily. 02/23/13   Noralee Space, MD  sertraline (ZOLOFT) 50 MG tablet Take 1 tablet (50 mg total) by mouth daily. 02/23/13   Noralee Space, MD  tolterodine (DETROL LA) 4 MG 24 hr capsule Take 4 mg by mouth. 06/20/14 06/20/15  Historical Provider, MD     Social History   Social History  . Marital Status: Married    Spouse Name: N/A  . Number of Children: 3  . Years of Education: N/A   Occupational History  .     Social History Main Topics  . Smoking status: Never Smoker   . Smokeless tobacco: Never Used  . Alcohol Use: No  . Drug Use: No  . Sexual Activity: Yes   Other Topics Concern  . 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 Mother Deceased 52    COPD, stroke  . Brother Deceased   . Father Deceased 84    brain tumor   Family History  Problem Relation Age of Onset  . COPD Mother   . Arthritis Mother   . Emphysema Mother   . Mental illness Paternal Grandfather   . Heart disease Other     Sibling  . Diabetes Other     Sibling   . Diabetes Brother   . Cancer Brother     prostate  . Cancer Father     malignant  brain tumor     ROS:  Full 14 point review of systems complete and found to be negative unless listed above.  Physical Exam: Blood pressure 118/71, pulse 56, temperature 98.1 F (36.7 C), temperature source Oral, resp. rate 19, SpO2 99 %.  General: Well developed, well nourished, female in no acute distress Head: Eyes PERRLA, No xanthomas.   Normocephalic and atraumatic, oropharynx without edema or exudate. Dentition: good  Lungs: CTA Heart: HRRR S1 S2, no rub/gallop, No murmur. Pulses are 2+ extrem.   Neck: No carotid bruits. No lymphadenopathy.  No JVD. Abdomen: Bowel sounds present, abdomen soft and non-tender without masses or hernias noted. Msk:  No spine or cva tenderness. No weakness, no joint deformities or effusions. Extremities: No clubbing or cyanosis. No edema.  Neuro: Alert and oriented X 3. No focal deficits noted. Psych:  Good affect, responds appropriately Skin: No rashes or lesions noted.  Labs:   Lab Results  Component Value Date   WBC 6.2 05/27/2015   HGB 13.8 05/27/2015   HCT 42.9 05/27/2015   MCV 90.1 05/27/2015   PLT 203 05/27/2015    Recent Labs Lab 05/27/15 1315  NA 141  K 3.7  CL 103  CO2 27  BUN 12  CREATININE 0.80  CALCIUM 9.5  GLUCOSE 162*    Recent Labs  05/27/15 1325  TROPIPOC 0.00      ECG: NSR with PVC's.   Radiology:  Dg Chest 2 View  05/27/2015  CLINICAL DATA:  Chest pain today EXAM: CHEST  2 VIEW COMPARISON:  03/15/2012 FINDINGS: The heart is upper  normal in size. Lungs are clear. No pneumothorax or pleural effusion. Normal vascularity. IMPRESSION: Borderline cardiomegaly without decompensation. Electronically Signed   By: Marybelle Killings M.D.   On: 05/27/2015 13:50    ASSESSMENT AND PLAN:    Principal Problem:   Chest pain Active Problems:   Diabetes mellitus ([place])   HYPERCHOLESTEROLEMIA   Essential hypertension   Primary cancer of lower-inner quadrant of left female breast ([place])  1. Chest pain: Her troponin has been  negative x1. EKG shows T wave inversion that is not on previous EKG from 2016. She had an episode of pain while I was in the room.  It was relieved by SL Nitro.  She does have family history of MI, her brother died from MI at the age of 71. She is not a smoker, does not drink alcohol. Her HR and BP are stable.   She does have risk factors including family history, DM, and HLD.  Last A1c was 6.2 this month, LDL is 144. We will cycle troponin and follow. She will need Myoview in the am.  2. SOB: patient reports SOB with minimal activity at home.  Denies lower extremity edema. Denies orthopnea.  Echo is pending.   Chest X ray does not show any pulmonary edema.      Signed: Arbutus Leas, NP 05/27/2015 4:12 PM Pager [phone]  Co-Sign MD

## 2015-05-27 NOTE — Progress Notes (Signed)
At 2037 patient c/o 3/10 chest pain which she described as sharp and left sided. An EKG was obtained and 1 SL nitro was given per unit protocol. Patient's pain was relieved after 1 SL nitroglycerin. BP initially 132/102, however patient was having acute pain at this time. BP 91/69 after nitro was given.   Paged K Schorr NP; order received for PRN sublingual nitroglycerin. Will continue to monitor.

## 2015-05-27 NOTE — H&P (Signed)
History and Physical    [name] [id number] DOB: Jun 06, 1940 DOA: 05/27/2015   PCP: Renato Shin, MD  Outpatient Specialists: Onc, Dr. Lindi Adie Patient coming from: home  Chief Complaint: chest pain  HPI: [name] is a 75 y.o. female with medical history significant of hypertension, hyperlipidemia, insulin-dependent diabetes, breast cancer, presents to the emergency room with a chief complaint of chest pain. Patient says that this morning, while at rest, she experienced sharp midsternal chest pain radiating to her left shoulder. It lasted about 20-30 minutes and went away on its own. She then proceeded with her today, and went to [employer] to pick up some fern and while she was walking she experience the same chest pain. She rested and it eventually subsided. She received a phone call this morning also and she was told her cholesterol was high, however patient denies stress related to that. She described reflux type pain for a while when she eats greasy food, coffee or chocolate, however this pain was different. She also has a third type of pain following her mastectomy, again different from the pain this morning. She has had chest pain like this in the past, many years ago, and remembers having a stress test but has been a while. She does not have a cardiologist. She endorses nausea, no vomiting. She endorses shortness of breath usually with exertion, and she has difficulties going up a flight of stairs due to dyspnea. She does not exercise regularly. She denies any recent fever or chills. She endorses diarrhea, which is chronic. She denies any abdominal pain. She has a history of tobacco use, was " never a smoker" but more social use. Her brother died of a heart attack in his 68s, he was abusing tobacco and alcohol.  ED Course: In the ED, patient had stable vital signs, has negative troponin, blood works fairly unremarkable, chest x-ray with borderline cardiomegaly, and EKG show  sinus rhythm with nonspecific ST segment changes in V 1-2, and T wave flattening V4-5-6, which were also present on EKG in 2016. TRH was asked for admission for chest pain rule out.  Review of Systems: As per HPI otherwise 10 point review of systems negative.   Past Medical History  Diagnosis Date  . DIABETES MELLITUS 08/10/2007  . 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 HYPERCHOLESTEROLEMIA 12/17/2006  . OBESITY 06/04/2008  . ANXIETY 06/04/2008  . DEPRESSION 08/10/2007  . HYPERTENSION 12/17/2006  . ASTHMATIC BRONCHITIS, ACUTE 06/04/2008  . ALLERGIC RHINITIS 12/13/2007  . GERD 12/17/2006  . DEGENERATIVE JOINT DISEASE 12/17/2006  . Asthma   . Blood transfusion 1964    post childbirth  . 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 History of chemotherapy   . History of radiation therapy 10/07/11-11/24/11    left breast,5040 cGy/28 sessions,l chest wall boost=1000cGy/5 sesions  . Osteopenia due to cancer therapy 09/12/2013    Past Surgical History  Procedure Laterality Date  . Laminectomy  1993    s/p-Dr. Rita Ohara  . Bladder tack  1974  . Cholecystectomy  02/23/1991    [facility] - Dr Margot Chimes  . Tonsillectomy    . Rotator cuff repair  8/03    right, Dr. Tonita Cong  . Enterocele repair  06/08    Dr. Cletis Media  . Rotator cuff repair  10/26/2008    left, Dr. Rush Farmer  . Hip surgery  08/27/2010    DR. Maureen Ralphs  . Breast excisional biopsy  11/10/1988    left benign Dr Margot Chimes  . Breast excisional biopsy  03/04/1983    Right - two areas - Dr Margot Chimes  . Breast excisional biopsy  10/09/1980    right - Dr Margot Chimes  . Breast excisional biopsy  10/18/1979    left - Dr Margot Chimes  . Breast excisional biopsy  01/02/1994    right - Dr Margot Chimes  . Vaginal hysterectomy  1974    partial   . Colonoscopy    . Modified radical mastectomy w/ axillary lymph node dissection      03-30-11 LT  . Portacath placement  04/13/2011    Procedure: INSERTION PORT-A-CATH;  Surgeon: Haywood Lasso, MD;  Location:  [facility];  Service: General;  Laterality: N/A;  porta cath placement,removal of J-P drain  . Mastectomy  03/23/2011    left  . Drain seroma  05/01/11    Chronic seroma @mastectomy  site  . Evacuation breast hematoma  07/01/2011    Procedure: EVACUATION HEMATOMA BREAST;  Surgeon: Haywood Lasso, MD;  Location: [facility];  Service: General;  Laterality: Left;  Drainage of left mastectomy seroma  . Breast surgery    . Port-a-cath removal  12/02/2011    Procedure: REMOVAL PORT-A-CATH;  Surgeon: Haywood Lasso, MD;  Location: [facility];  Service: General;  Laterality: N/A;     reports that she has never smoked. She has never used smokeless tobacco. She reports that she does not drink alcohol or use illicit drugs.  Allergies  Allergen Reactions  . Cephalexin     REACTION: hives  . Clindamycin     REACTION: hives  . Dilaudid [Hydromorphone Hcl] Itching  . Oxycodone-Acetaminophen     REACTION: hives  . Rocephin [Ceftriaxone Sodium In Dextrose] Hives  . Sulfonamide Derivatives     REACTION: hives    Family History  Problem Relation Age of Onset  . COPD Mother   . Arthritis Mother   . Emphysema Mother   . Mental illness Paternal Grandfather   . Heart disease Other     Sibling  . Diabetes Other     Sibling   . Diabetes Brother   . Cancer Brother     prostate  . Cancer Father     malignant brain tumor    Prior to Admission medications   Medication Sig Start Date End Date Taking? Authorizing Provider  acyclovir (ZOVIRAX) 200 MG capsule TAKE 2 CAPSULES BY MOUTH 3 TIMES DAILY 10/23/13   Noralee Space, MD  albuterol (PROVENTIL) (2.5 MG/3ML) 0.083% nebulizer solution  02/05/14   Historical Provider, MD  ALPRAZolam Duanne Moron) 0.5 MG tablet TAKE ONE-HALF TO ONE TABLET BY MOUTH THREE TIMES DAILY AS NEEDED FOR  NERVES 02/23/13   Noralee Space, MD  amLODipine (NORVASC) 10 MG tablet Take 1 tablet (10 mg total) by mouth daily. 02/23/13   Noralee Space, MD    atorvastatin (LIPITOR) 40 MG tablet Take 1 tablet (40 mg total) by mouth daily. 02/23/13 03/27/15  Noralee Space, MD  Blood Glucose Monitoring Suppl (EASY TALK BLOOD GLUCOSE SYSTEM) DEVI Use once daily as directed to check blood glucose 04/13/12   Historical Provider, MD  chlorpheniramine-HYDROcodone ([place]) 10-8 MG/5ML LQCR Take 1/2 to 1 tsp at bedtime for severe cough 02/15/14   Historical Provider, MD  cholecalciferol (VITAMIN D) 1000 UNITS tablet Take 1 tablet (1,000 Units total) by mouth daily. 02/23/13   Noralee Space, MD  cyanocobalamin 500 MCG tablet Take 1  tablet (500 mcg total) by mouth daily. 02/23/13   Noralee Space, MD  EASY TALK BLOOD GLUCOSE TEST test strip Use to check blood glucose once daily as directed 03/26/12   Historical Provider, MD  Insulin Glargine (LANTUS) 100 UNIT/ML Solostar Pen Inject 55 Units into the skin at bedtime. 03/02/13   Renato Shin, MD  Insulin Pen Needle (RELION PEN NEEDLE 31G/8MM) 31G X 8 MM MISC 1 Device by Does not apply route 4 (four) times daily. 10/05/12   Renato Shin, MD  letrozole (FEMARA) 2.5 MG tablet TAKE ONE TABLET BY MOUTH DAILY. 08/25/12   Consuela Mimes, MD  Liraglutide (VICTOZA) 18 MG/3ML SOPN Inject 3 mLs (18 mg total) into the skin every morning. 04/18/15   Nicholas Lose, MD  losartan (COZAAR) 100 MG tablet Take 1 tablet (100 mg total) by mouth daily. 02/23/13   Noralee Space, MD  metFORMIN (GLUCOPHAGE) 500 MG tablet TAKE ONE TABLET BID 06/20/14   Historical Provider, MD  Multiple Vitamins-Minerals (ONE-A-DAY WOMENS 50+ ADVANTAGE) TABS Take 1 tablet by mouth daily. 02/23/13   Noralee Space, MD  omeprazole (PRILOSEC) 20 MG capsule Take 1 capsule (20 mg total) by mouth daily. 02/23/13   Noralee Space, MD  sertraline (ZOLOFT) 50 MG tablet Take 1 tablet (50 mg total) by mouth daily. 02/23/13   Noralee Space, MD  tolterodine (DETROL LA) 4 MG 24 hr capsule Take 4 mg by mouth. 06/20/14 06/20/15  Historical Provider, MD    Physical Exam: Filed Vitals:    05/27/15 1304 05/27/15 1400 05/27/15 1445 05/27/15 1530  BP: 106/63 118/59 126/64 118/71  Pulse: 72 77 61 56  Temp: 98.1 F (36.7 C)     TempSrc: Oral     Resp: 18 23 21 19   SpO2: 95% 95% 97% 99%      Constitutional: NAD, calm, comfortable Filed Vitals:   05/27/15 1304 05/27/15 1400 05/27/15 1445 05/27/15 1530  BP: 106/63 118/59 126/64 118/71  Pulse: 72 77 61 56  Temp: 98.1 F (36.7 C)     TempSrc: Oral     Resp: 18 23 21 19   SpO2: 95% 95% 97% 99%   Eyes: PERRL ENMT: Mucous membranes are moist. Posterior pharynx clear of any exudate or lesions. Respiratory: clear to auscultation bilaterally, no wheezing, no crackles. Normal respiratory effort. No accessory muscle use.  Cardiovascular: Regular rate and rhythm, no murmurs / rubs / gallops. No extremity edema. 2+ pedal pulses. No carotid bruits.  Abdomen: no tenderness,  Bowel sounds positive.  Musculoskeletal: no clubbing / cyanosis. No joint deformity upper and lower extremities.  Neurologic: non focal  Psychiatric: Normal judgment and insight. Alert and oriented x 3. Normal mood.   Labs on Admission: I have personally reviewed following labs and imaging studies  CBC:  Recent Labs Lab 05/27/15 1315  WBC 6.2  HGB 13.8  HCT 42.9  MCV 90.1  PLT 123456   Basic Metabolic Panel:  Recent Labs Lab 05/27/15 1315  NA 141  K 3.7  CL 103  CO2 27  GLUCOSE 162*  BUN 12  CREATININE 0.80  CALCIUM 9.5   GFR: CrCl cannot be calculated (Unknown ideal weight.). Liver Function Tests: No results for input(s): AST, ALT, ALKPHOS, BILITOT, PROT, ALBUMIN in the last 168 hours. No results for input(s): LIPASE, AMYLASE in the last 168 hours. No results for input(s): AMMONIA in the last 168 hours. Coagulation Profile: No results for input(s): INR, PROTIME in the last 168 hours. Cardiac Enzymes: No results  for input(s): CKTOTAL, CKMB, CKMBINDEX, TROPONINI in the last 168 hours. BNP (last 3 results) No results for input(s): PROBNP  in the last 8760 hours. HbA1C: No results for input(s): HGBA1C in the last 72 hours. CBG: No results for input(s): GLUCAP in the last 168 hours. Lipid Profile: No results for input(s): CHOL, HDL, LDLCALC, TRIG, CHOLHDL, LDLDIRECT in the last 72 hours. Thyroid Function Tests: No results for input(s): TSH, T4TOTAL, FREET4, T3FREE, THYROIDAB in the last 72 hours. Anemia Panel: No results for input(s): VITAMINB12, FOLATE, FERRITIN, TIBC, IRON, RETICCTPCT in the last 72 hours. Urine analysis:    Component Value Date/Time   COLORURINE YELLOW 06/25/2011 2040   APPEARANCEUR CLEAR 06/25/2011 2040   LABSPEC 1.012 06/25/2011 2040   PHURINE 7.5 06/25/2011 2040   GLUCOSEU NEGATIVE 06/25/2011 2040   GLUCOSEU NEGATIVE 03/05/2010 1006   HGBUR NEGATIVE 06/25/2011 2040   BILIRUBINUR NEGATIVE 06/25/2011 2040   KETONESUR NEGATIVE 06/25/2011 2040   PROTEINUR NEGATIVE 06/25/2011 2040   UROBILINOGEN 0.2 06/25/2011 2040   NITRITE NEGATIVE 06/25/2011 2040   LEUKOCYTESUR SMALL* 06/25/2011 2040   Sepsis Labs: @LABRCNTIP (procalcitonin:4,lacticidven:4) )No results found for this or any previous visit (from the past 240 hour(s)).   Radiological Exams on Admission: Dg Chest 2 View  05/27/2015  CLINICAL DATA:  Chest pain today EXAM: CHEST  2 VIEW COMPARISON:  03/15/2012 FINDINGS: The heart is upper normal in size. Lungs are clear. No pneumothorax or pleural effusion. Normal vascularity. IMPRESSION: Borderline cardiomegaly without decompensation. Electronically Signed   By: Marybelle Killings M.D.   On: 05/27/2015 13:50    Assessment/Plan Principal Problem:   Chest pain Active Problems:   Diabetes mellitus ([place])   HYPERCHOLESTEROLEMIA   Essential hypertension   Primary cancer of lower-inner quadrant of left female breast ([place])   Chest pain - with typical and atypical components, there is an exertional component is no chest pain appeared when she was walking in the store this morning. Score 5. - EKG  nonischemic, initial troponin negative - Admit on telemetry, cycle cardiac enzymes overnight, cardiology consulted to evaluate, I think she would need at least a stress test - Obtain a 2-D echo given cardiomegaly on chest x-ray  Diabetes mellitus - Patient's last albumin A1c was 8.2, however this was obtained in 2015, repeat hemoglobin A1c - Restart home Lantus, however at lower dose, add sliding scale  Hyperlipidemia - Continue statin  Hypertension - Continue home medications  Breast cancer - Followed by oncology as an outpatient, resume Letrozole    DVT prophylaxis: lovenox Code Status: Full  Family Communication: d/w husband bedside Disposition Plan: admit to tele, home when ready  Consults called: cardiology  Admission status: obs    Marzetta Board, MD [facility] Pager [phone]  If 7PM-7AM, please contact night-coverage [email or web address] Password [facility]  05/27/2015, 3:58 PM

## 2015-05-28 ENCOUNTER — Observation Stay (HOSPITAL_COMMUNITY): Payer: Medicare HMO

## 2015-05-28 ENCOUNTER — Observation Stay (HOSPITAL_BASED_OUTPATIENT_CLINIC_OR_DEPARTMENT_OTHER): Payer: Medicare HMO

## 2015-05-28 DIAGNOSIS — R079 Chest pain, unspecified: Secondary | ICD-10-CM

## 2015-05-28 DIAGNOSIS — I10 Essential (primary) hypertension: Secondary | ICD-10-CM | POA: Diagnosis not present

## 2015-05-28 DIAGNOSIS — E7800 Pure hypercholesterolemia, unspecified: Secondary | ICD-10-CM | POA: Diagnosis not present

## 2015-05-28 DIAGNOSIS — I517 Cardiomegaly: Secondary | ICD-10-CM

## 2015-05-28 LAB — NM MYOCAR MULTI W/SPECT W/WALL MOTION / EF
CHL CUP RESTING HR STRESS: 56 {beats}/min
CSEPED: 5 min
CSEPEW: 1 METS
CSEPPHR: 113 {beats}/min
LV dias vol: 123 mL (ref 46–106)
LVSYSVOL: 86 mL
MPHR: 146 {beats}/min
NUC STRESS TID: 1.01
Percent HR: 77 %
RATE: 0.31
SDS: 3
SRS: 12
SSS: 15

## 2015-05-28 LAB — GLUCOSE, CAPILLARY
GLUCOSE-CAPILLARY: 113 mg/dL — AB (ref 65–99)
Glucose-Capillary: 137 mg/dL — ABNORMAL HIGH (ref 65–99)
Glucose-Capillary: 169 mg/dL — ABNORMAL HIGH (ref 65–99)

## 2015-05-28 LAB — ECHOCARDIOGRAM COMPLETE
HEIGHTINCHES: 67 in
Weight: 3070.4 oz

## 2015-05-28 LAB — TROPONIN I: Troponin I: 0.03 ng/mL (ref <0.031)

## 2015-05-28 LAB — HEMOGLOBIN A1C
Hgb A1c MFr Bld: 6.6 % — ABNORMAL HIGH (ref 4.8–5.6)
MEAN PLASMA GLUCOSE: 143 mg/dL

## 2015-05-28 MED ORDER — TECHNETIUM TC 99M SESTAMIBI GENERIC - CARDIOLITE
10.0000 | Freq: Once | INTRAVENOUS | Status: AC | PRN
  Administered 2015-05-28: 10 via INTRAVENOUS

## 2015-05-28 MED ORDER — SODIUM CHLORIDE 0.9 % IV SOLN: 250.0000 mL | INTRAVENOUS | Status: DC | PRN

## 2015-05-28 MED ORDER — SODIUM CHLORIDE 0.9% FLUSH: 3.0000 mL | INTRAVENOUS | Status: DC | PRN

## 2015-05-28 MED ORDER — SODIUM CHLORIDE 0.9 % WEIGHT BASED INFUSION
3.0000 mL/kg/h | INTRAVENOUS | Status: DC
  Administered 2015-05-29: 3 mL/kg/h via INTRAVENOUS

## 2015-05-28 MED ORDER — SODIUM CHLORIDE 0.9% FLUSH
3.0000 mL | Freq: Two times a day (BID) | INTRAVENOUS | Status: DC
  Administered 2015-05-28 (×2): 3 mL via INTRAVENOUS

## 2015-05-28 MED ORDER — REGADENOSON 0.4 MG/5ML IV SOLN
0.4000 mg | Freq: Once | INTRAVENOUS | Status: AC
Start: 2015-05-28 — End: 2015-05-28
  Administered 2015-05-28: 0.4 mg via INTRAVENOUS
  Filled 2015-05-28: qty 5

## 2015-05-28 MED ORDER — REGADENOSON 0.4 MG/5ML IV SOLN
INTRAVENOUS | Status: AC
  Administered 2015-05-28: 0.4 mg via INTRAVENOUS
  Filled 2015-05-28: qty 5

## 2015-05-28 MED ORDER — SODIUM CHLORIDE 0.9 % WEIGHT BASED INFUSION: 1.0000 mL/kg/h | INTRAVENOUS | Status: DC

## 2015-05-28 MED ORDER — TECHNETIUM TC 99M SESTAMIBI GENERIC - CARDIOLITE
30.0000 | Freq: Once | INTRAVENOUS | Status: AC | PRN
  Administered 2015-05-28: 30 via INTRAVENOUS

## 2015-05-28 MED ORDER — ASPIRIN 81 MG PO CHEW
81.0000 mg | CHEWABLE_TABLET | ORAL | Status: AC
  Administered 2015-05-29: 81 mg via ORAL
  Filled 2015-05-28: qty 1

## 2015-05-28 NOTE — Progress Notes (Signed)
2D echo with moderately reduced LVF with EF 35% and nuclear stress test with inferior infarct and EF 30-35%.  Will plan left heart cath in am.

## 2015-05-28 NOTE — Progress Notes (Signed)
  Echocardiogram 2D Echocardiogram has been performed.  [name] M 05/28/2015, 2:11 PM

## 2015-05-28 NOTE — Care Management Obs Status (Signed)
[facility] NOTIFICATION   Patient Details  Name: [name] MRN: [id number] Date of Birth: 12/20/1940   Medicare Observation Status Notification Given:  Yes    Bethena Roys, RN 05/28/2015, 11:53 AM

## 2015-05-28 NOTE — Progress Notes (Signed)
PROGRESS NOTE  LIBERTEE BENDICK [id number] DOB: 11/09/1940 DOA: 05/27/2015 PCP: Renato Shin, MD Outpatient Specialists:      Brief Narrative: 75 y.o. female with medical history significant of hypertension, hyperlipidemia, insulin-dependent diabetes, breast cancer, presents to the emergency room with a chief complaint of chest pain  Assessment & Plan: Principal Problem:   Chest pain Active Problems:   Diabetes mellitus ([place])   HYPERCHOLESTEROLEMIA   Essential hypertension   Primary cancer of lower-inner quadrant of left female breast ([place])   Pain in the chest   Chest pain - cardiology consulted, appreciate input. She underwent a stress test today which shows EF 30% with inferior hypokinesis, prior inferior infarction.  - plan for cardiac catheterization tomorrow per cardiology. - 2D echo as below, EF 35-40%  Diabetes mellitus - Patient's last HbA1c was 8.2, however this was obtained in 2015, repeat hemoglobin A1c 6.6 - continue Lantus + SSI  Hyperlipidemia - Continue statin  Hypertension - Continue home medications  Breast cancer - Followed by oncology as an outpatient, resume Letrozole   DVT prophylaxis: lovenox Code Status: Full Family Communication: no family bedside Disposition Plan: home when ready  Barriers for discharge: cath tomorrow  Consultants:   Cardiology   Procedures:  2D echo: Study Conclusions - Left ventricle: Endocardial segments are not adequately visualized to comment on regional wall motion. Recommend limited study with definity contrast. The cavity size was normal. There was mild concentric hypertrophy. Systolic function was moderately reduced. The estimated ejection fraction was in the range of 35% to 40%. There was an increased relative contribution of atrial contraction to ventricular filling. Doppler parameters are consistent with abnormal left ventricular relaxation (grade 1 diastolic dysfunction). - Aortic valve: Moderate  focal calcification. - Mitral valve: Valve area by pressure half-time: 0.83 cm^2. - Pulmonic valve: There was mild regurgitation. - Recommendations: Endocardial segments are not adequately visualized to comment on regional wall motion. Recommend limited study with definity contrast.  Antimicrobials:  None    Subjective: No complaints this morning, awaiting stress test Had an episode of chest pain overnight, relieved by nitro. Had a headache after nitro  Objective: Filed Vitals:   05/28/15 1033 05/28/15 1036 05/28/15 1203 05/28/15 1435  BP: 207/83 209/82 141/79 122/73  Pulse: 109 105  80  Temp:    98.6 F (37 C)  TempSrc:    Oral  Resp:      Height:      Weight:      SpO2:    96%    Intake/Output Summary (Last 24 hours) at 05/28/15 1459 Last data filed at 05/28/15 0400  Gross per 24 hour  Intake    240 ml  Output    250 ml  Net    -10 ml   Filed Weights   05/27/15 1734 05/28/15 0430  Weight: 87.363 kg (192 lb 9.6 oz) 87.045 kg (191 lb 14.4 oz)    Examination: Constitutional: NAD Filed Vitals:   05/28/15 1033 05/28/15 1036 05/28/15 1203 05/28/15 1435  BP: 207/83 209/82 141/79 122/73  Pulse: 109 105  80  Temp:    98.6 F (37 C)  TempSrc:    Oral  Resp:      Height:      Weight:      SpO2:    96%   Eyes: PERRL ENMT: Mucous membranes are moist Respiratory: clear to auscultation bilaterally, no wheezing, no crackles. Normal respiratory effort. No accessory muscle use.  Cardiovascular: Regular rate and rhythm, no murmurs / rubs /  gallops. No extremity edema. 2+ pedal pulses. No carotid bruits.  Abdomen: no tenderness, Bowel sounds positive.  Musculoskeletal: no clubbing / cyanosis.  Skin: no rashes, lesions, ulcers. No induration Neurologic: non focal   Psychiatric: Normal judgment and insight. Alert and oriented x 3. Normal mood.    Data Reviewed: I have personally reviewed following labs and imaging studies  CBC:  Recent Labs Lab 05/27/15 1315  WBC  6.2  HGB 13.8  HCT 42.9  MCV 90.1  PLT 123456   Basic Metabolic Panel:  Recent Labs Lab 05/27/15 1315  NA 141  K 3.7  CL 103  CO2 27  GLUCOSE 162*  BUN 12  CREATININE 0.80  CALCIUM 9.5   GFR: Estimated Creatinine Clearance: 69.9 mL/min (by C-G formula based on Cr of 0.8). Liver Function Tests: No results for input(s): AST, ALT, ALKPHOS, BILITOT, PROT, ALBUMIN in the last 168 hours. No results for input(s): LIPASE, AMYLASE in the last 168 hours. No results for input(s): AMMONIA in the last 168 hours. Coagulation Profile: No results for input(s): INR, PROTIME in the last 168 hours. Cardiac Enzymes:  Recent Labs Lab 05/27/15 1747 05/27/15 2157 05/28/15 0421  TROPONINI <0.03 <0.03 0.03   BNP (last 3 results) No results for input(s): PROBNP in the last 8760 hours. HbA1C:  Recent Labs  05/27/15 1747  HGBA1C 6.6*   CBG:  Recent Labs Lab 05/27/15 2145 05/28/15 1157  GLUCAP 108* 137*   Lipid Profile: No results for input(s): CHOL, HDL, LDLCALC, TRIG, CHOLHDL, LDLDIRECT in the last 72 hours. Thyroid Function Tests: No results for input(s): TSH, T4TOTAL, FREET4, T3FREE, THYROIDAB in the last 72 hours. Anemia Panel: No results for input(s): VITAMINB12, FOLATE, FERRITIN, TIBC, IRON, RETICCTPCT in the last 72 hours. Urine analysis:    Component Value Date/Time   COLORURINE YELLOW 06/25/2011 2040   APPEARANCEUR CLEAR 06/25/2011 2040   LABSPEC 1.012 06/25/2011 2040   PHURINE 7.5 06/25/2011 2040   GLUCOSEU NEGATIVE 06/25/2011 2040   GLUCOSEU NEGATIVE 03/05/2010 1006   HGBUR NEGATIVE 06/25/2011 2040   BILIRUBINUR NEGATIVE 06/25/2011 2040   KETONESUR NEGATIVE 06/25/2011 2040   PROTEINUR NEGATIVE 06/25/2011 2040   UROBILINOGEN 0.2 06/25/2011 2040   NITRITE NEGATIVE 06/25/2011 2040   LEUKOCYTESUR SMALL* 06/25/2011 2040   Sepsis Labs: Invalid input(s): PROCALCITONIN, LACTICIDVEN  No results found for this or any previous visit (from the past 240 hour(s)).     Radiology Studies: Dg Chest 2 View  05/27/2015  CLINICAL DATA:  Chest pain today EXAM: CHEST  2 VIEW COMPARISON:  03/15/2012 FINDINGS: The heart is upper normal in size. Lungs are clear. No pneumothorax or pleural effusion. Normal vascularity. IMPRESSION: Borderline cardiomegaly without decompensation. Electronically Signed   By: Marybelle Killings M.D.   On: 05/27/2015 13:50   Nm Myocar Multi W/spect W/wall Motion / Ef  05/28/2015   Downsloping ST segment depression ST segment depression was noted during stress in the aVL leads.  Findings consistent with prior myocardial infarction.  This is an intermediate risk study.  The left ventricular ejection fraction is moderately decreased (30-44%).  1. EF 30% with inferior hypokinesis. 2. Fixed medium-sized, moderate intensity basal to apical inferior perfusion defect.  This suggests prior inferior infarction.  No significant ischemia. 3. Intermediate risk study with decreased LV EF.     Scheduled Meds: . acyclovir  200 mg Oral TID  . amLODipine  10 mg Oral Daily  . atorvastatin  40 mg Oral q1800  . enoxaparin (LOVENOX) injection  40 mg Subcutaneous  Q24H  . insulin aspart  0-9 Units Subcutaneous TID WC  . insulin glargine  30 Units Subcutaneous QHS  . letrozole  2.5 mg Oral Daily  . losartan  100 mg Oral Daily  . multivitamin with minerals  1 tablet Oral Daily  . sertraline  50 mg Oral Daily   Continuous Infusions:    Marzetta Board, MD, PhD [facility] Pager [phone]  If 7PM-7AM, please contact night-coverage [email or web address] Password [email or web address] 05/28/2015, 2:59 PM

## 2015-05-28 NOTE — Progress Notes (Signed)
Patient Profile: 75 year old female with a past medical history of DM, HLD, obesity, HTN, and breast CA. No prior cardiac history. Admitted for CP.  Subjective: Currently CP free. Tolerated NST ok but did have dyspnea and HA during test. Symptoms resolved post stress.   Objective: Vital signs in last 24 hours: Temp:  [97.4 F (36.3 C)-98.1 F (36.7 C)] 97.7 F (36.5 C) (04/25 0430) Pulse Rate:  [56-109] 105 (04/25 1036) Resp:  [11-23] 16 (04/25 0957) BP: (91-209)/(50-107) 209/82 mmHg (04/25 1036) SpO2:  [94 %-99 %] 94 % (04/25 0430) Weight:  [191 lb 14.4 oz (87.045 kg)-192 lb 9.6 oz (87.363 kg)] 191 lb 14.4 oz (87.045 kg) (04/25 0430)    Intake/Output from previous day: 04/24 0701 - 04/25 0700 In: 240 [P.O.:240] Out: 250 [Urine:250] Intake/Output this shift:    Medications Current Facility-Administered Medications  Medication Dose Route Frequency Provider Last Rate Last Dose  . acetaminophen (TYLENOL) tablet 650 mg  650 mg Oral Q4H PRN Costin Karlyne Greenspan, MD      . acyclovir (ZOVIRAX) 200 MG capsule 200 mg  200 mg Oral TID Assunta Found Stone, RPH   200 mg at 05/27/15 2210  . albuterol (PROVENTIL) (2.5 MG/3ML) 0.083% nebulizer solution 2.5 mg  2.5 mg Nebulization Q6H PRN Costin Karlyne Greenspan, MD      . ALPRAZolam Duanne Moron) tablet 0.25 mg  0.25 mg Oral TID PRN Caren Griffins, MD      . amLODipine (NORVASC) tablet 10 mg  10 mg Oral Daily Costin Karlyne Greenspan, MD   10 mg at 05/27/15 1816  . atorvastatin (LIPITOR) tablet 40 mg  40 mg Oral q1800 Caren Griffins, MD   40 mg at 05/27/15 1842  . enoxaparin (LOVENOX) injection 40 mg  40 mg Subcutaneous Q24H Caren Griffins, MD   40 mg at 05/27/15 1842  . insulin aspart (novoLOG) injection 0-9 Units  0-9 Units Subcutaneous TID WC Costin Karlyne Greenspan, MD   0 Units at 05/27/15 1816  . insulin glargine (LANTUS) injection 30 Units  30 Units Subcutaneous QHS Caren Griffins, MD   30 Units at 05/27/15 2210  . letrozole [facility]) tablet 2.5 mg  2.5 mg  Oral Daily Caren Griffins, MD   2.5 mg at 05/27/15 2032  . losartan (COZAAR) tablet 100 mg  100 mg Oral Daily Caren Griffins, MD   100 mg at 05/27/15 1841  . multivitamin with minerals tablet 1 tablet  1 tablet Oral Daily Costin Karlyne Greenspan, MD   1 tablet at 05/27/15 1843  . nitroGLYCERIN (NITROSTAT) SL tablet 0.4 mg  0.4 mg Sublingual Q5 min PRN Jeryl Columbia, NP      . ondansetron (ZOFRAN) injection 4 mg  4 mg Intravenous Q6H PRN Costin Karlyne Greenspan, MD      . sertraline (ZOLOFT) tablet 50 mg  50 mg Oral Daily Costin Karlyne Greenspan, MD   50 mg at 05/27/15 1842    PE: General appearance: alert, cooperative and no distress Neck: no carotid bruit and no JVD Lungs: clear to auscultation bilaterally Heart: regular rate and rhythm, S1, S2 normal, no murmur, click, rub or gallop Extremities: no LEE Pulses: 2+ and symmetric Skin: warm and dry Neurologic: Grossly normal  Lab Results:   Recent Labs  05/27/15 1315  WBC 6.2  HGB 13.8  HCT 42.9  PLT 203   BMET  Recent Labs  05/27/15 1315  NA 141  K 3.7  CL 103  CO2  27  GLUCOSE 162*  BUN 12  CREATININE 0.80  CALCIUM 9.5   Cardiac Panel (last 3 results)  Recent Labs  05/27/15 1747 05/27/15 2157 05/28/15 0421  TROPONINI <0.03 <0.03 0.03    Studies/Results: NST - pending  Assessment/Plan    Principal Problem:   Chest pain Active Problems:   Diabetes mellitus ([place])   HYPERCHOLESTEROLEMIA   Essential hypertension   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 Chest Pain: she has ruled out for MI with negative cardiac enzymes. NST completed. Radiologist interpretation pending. 2D echo also pending.   2. Dyspnea: patient reports SOB with minimal activity at home. Denies lower extremity edema. Denies orthopnea. Echo is pending as well as NST to assess for coronary ischemia.Chest X ray does not show any pulmonary edema.  3. HTN: BP has been running on the high side  this am. Hypertensive response during chemical stress test. Amlodipine ordered. Also on losartan. Monitor response to amlodipine. Adjust meds further if needed.   4. HLD: continue statin.    Brittainy M. Rosita Fire, PA-C 05/28/2015 10:45 AM

## 2015-05-29 ENCOUNTER — Encounter (HOSPITAL_COMMUNITY): Payer: Self-pay | Admitting: Internal Medicine

## 2015-05-29 ENCOUNTER — Encounter (HOSPITAL_COMMUNITY)
Admission: EM | Disposition: A | Discharge status: Home / Self Care | Payer: Self-pay | Source: Home / Self Care | Attending: Emergency Medicine

## 2015-05-29 DIAGNOSIS — R072 Precordial pain: Secondary | ICD-10-CM | POA: Diagnosis not present

## 2015-05-29 DIAGNOSIS — I420 Dilated cardiomyopathy: Secondary | ICD-10-CM | POA: Diagnosis not present

## 2015-05-29 DIAGNOSIS — I5022 Chronic systolic (congestive) heart failure: Secondary | ICD-10-CM | POA: Diagnosis not present

## 2015-05-29 DIAGNOSIS — Z794 Long term (current) use of insulin: Secondary | ICD-10-CM

## 2015-05-29 DIAGNOSIS — R9439 Abnormal result of other cardiovascular function study: Secondary | ICD-10-CM

## 2015-05-29 DIAGNOSIS — C50312 Malignant neoplasm of lower-inner quadrant of left female breast: Secondary | ICD-10-CM

## 2015-05-29 DIAGNOSIS — R079 Chest pain, unspecified: Secondary | ICD-10-CM | POA: Diagnosis not present

## 2015-05-29 DIAGNOSIS — I5042 Chronic combined systolic (congestive) and diastolic (congestive) heart failure: Secondary | ICD-10-CM

## 2015-05-29 DIAGNOSIS — I5021 Acute systolic (congestive) heart failure: Secondary | ICD-10-CM | POA: Diagnosis not present

## 2015-05-29 DIAGNOSIS — E119 Type 2 diabetes mellitus without complications: Secondary | ICD-10-CM

## 2015-05-29 HISTORY — DX: Dilated cardiomyopathy: I42.0

## 2015-05-29 HISTORY — PX: CARDIAC CATHETERIZATION: SHX172

## 2015-05-29 HISTORY — DX: Chronic combined systolic (congestive) and diastolic (congestive) heart failure: I50.42

## 2015-05-29 LAB — CBC
HCT: 38.9 % (ref 36.0–46.0)
HEMOGLOBIN: 12.5 g/dL (ref 12.0–15.0)
MCH: 28.8 pg (ref 26.0–34.0)
MCHC: 32.1 g/dL (ref 30.0–36.0)
MCV: 89.6 fL (ref 78.0–100.0)
Platelets: 222 10*3/uL (ref 150–400)
RBC: 4.34 MIL/uL (ref 3.87–5.11)
RDW: 13.6 % (ref 11.5–15.5)
WBC: 7 10*3/uL (ref 4.0–10.5)

## 2015-05-29 LAB — GLUCOSE, CAPILLARY
GLUCOSE-CAPILLARY: 96 mg/dL (ref 65–99)
GLUCOSE-CAPILLARY: 68 mg/dL (ref 65–99)
GLUCOSE-CAPILLARY: 135 mg/dL — AB (ref 65–99)
GLUCOSE-CAPILLARY: 152 mg/dL — AB (ref 65–99)
Glucose-Capillary: 88 mg/dL (ref 65–99)
Glucose-Capillary: 222 mg/dL — ABNORMAL HIGH (ref 65–99)

## 2015-05-29 LAB — CREATININE, SERUM
Creatinine, Ser: 1.05 mg/dL — ABNORMAL HIGH (ref 0.44–1.00)
GFR calc Af Amer: 59 mL/min — ABNORMAL LOW (ref >60)
GFR calc non Af Amer: 51 mL/min — ABNORMAL LOW (ref >60)

## 2015-05-29 LAB — BRAIN NATRIURETIC PEPTIDE: B Natriuretic Peptide: 90.4 pg/mL (ref 0.0–100.0)

## 2015-05-29 LAB — PROTIME-INR
INR: 1.04 (ref 0.00–1.49)
PROTHROMBIN TIME: 13.8 s (ref 11.6–15.2)

## 2015-05-29 SURGERY — LEFT HEART CATH AND CORONARY ANGIOGRAPHY
Anesthesia: LOCAL

## 2015-05-29 MED ORDER — SODIUM CHLORIDE 0.9% FLUSH: 3.0000 mL | INTRAVENOUS | Status: DC | PRN

## 2015-05-29 MED ORDER — HEPARIN (PORCINE) IN NACL 2-0.9 UNIT/ML-% IJ SOLN
INTRAMUSCULAR | Status: DC | PRN
Start: 2015-05-29 — End: 2015-05-29
  Administered 2015-05-29: 1000 mL via INTRA_ARTERIAL

## 2015-05-29 MED ORDER — LIDOCAINE HCL (PF) 1 % IJ SOLN
INTRAMUSCULAR | Status: DC | PRN
  Administered 2015-05-29: 2 mL

## 2015-05-29 MED ORDER — HEPARIN (PORCINE) IN NACL 2-0.9 UNIT/ML-% IJ SOLN
INTRAMUSCULAR | Status: AC
  Filled 2015-05-29: qty 1000

## 2015-05-29 MED ORDER — MIDAZOLAM HCL 2 MG/2ML IJ SOLN
INTRAMUSCULAR | Status: AC
  Filled 2015-05-29: qty 2

## 2015-05-29 MED ORDER — FENTANYL CITRATE (PF) 100 MCG/2ML IJ SOLN
INTRAMUSCULAR | Status: AC
  Filled 2015-05-29: qty 2

## 2015-05-29 MED ORDER — ENOXAPARIN SODIUM 40 MG/0.4ML ~~LOC~~ SOLN: 40.0000 mg | SUBCUTANEOUS | Status: DC

## 2015-05-29 MED ORDER — DEXTROSE 50 % IV SOLN
25.0000 mL | Freq: Once | INTRAVENOUS | Status: AC
  Administered 2015-05-29: 50 mL via INTRAVENOUS

## 2015-05-29 MED ORDER — SODIUM CHLORIDE 0.9% FLUSH: 3.0000 mL | Freq: Two times a day (BID) | INTRAVENOUS | Status: DC

## 2015-05-29 MED ORDER — IOPAMIDOL (ISOVUE-370) INJECTION 76%
INTRAVENOUS | Status: DC | PRN
  Administered 2015-05-29: 90 mL via INTRA_ARTERIAL

## 2015-05-29 MED ORDER — NITROGLYCERIN 1 MG/10 ML FOR IR/CATH LAB: INTRA_ARTERIAL | Status: DC | PRN

## 2015-05-29 MED ORDER — DEXTROSE 50 % IV SOLN
INTRAVENOUS | Status: AC
  Filled 2015-05-29: qty 50

## 2015-05-29 MED ORDER — MIDAZOLAM HCL 2 MG/2ML IJ SOLN
INTRAMUSCULAR | Status: DC | PRN
  Administered 2015-05-29 (×2): 1 mg via INTRAVENOUS

## 2015-05-29 MED ORDER — SODIUM CHLORIDE 0.9 % IV SOLN: 250.0000 mL | INTRAVENOUS | Status: DC | PRN

## 2015-05-29 MED ORDER — HEPARIN (PORCINE) IN NACL 2-0.9 UNIT/ML-% IJ SOLN
INTRAMUSCULAR | Status: DC | PRN
  Administered 2015-05-29: 10 mL via INTRA_ARTERIAL

## 2015-05-29 MED ORDER — SODIUM CHLORIDE 0.9 % WEIGHT BASED INFUSION: 1.0000 mL/kg/h | INTRAVENOUS | Status: AC

## 2015-05-29 MED ORDER — HEPARIN SODIUM (PORCINE) 1000 UNIT/ML IJ SOLN
INTRAMUSCULAR | Status: DC | PRN
  Administered 2015-05-29: 4300 [IU] via INTRAVENOUS

## 2015-05-29 MED ORDER — HEPARIN SODIUM (PORCINE) 1000 UNIT/ML IJ SOLN
INTRAMUSCULAR | Status: AC
  Filled 2015-05-29: qty 1

## 2015-05-29 MED ORDER — CARVEDILOL 3.125 MG PO TABS
3.1250 mg | ORAL_TABLET | Freq: Two times a day (BID) | ORAL | Status: DC
  Administered 2015-05-29 – 2015-05-30 (×3): 3.125 mg via ORAL
  Filled 2015-05-29 (×3): qty 1

## 2015-05-29 MED ORDER — IOPAMIDOL (ISOVUE-370) INJECTION 76%
INTRAVENOUS | Status: AC
  Filled 2015-05-29: qty 100

## 2015-05-29 MED ORDER — LIDOCAINE HCL (PF) 1 % IJ SOLN
INTRAMUSCULAR | Status: AC
  Filled 2015-05-29: qty 30

## 2015-05-29 MED ORDER — VERAPAMIL HCL 2.5 MG/ML IV SOLN
INTRAVENOUS | Status: AC
  Filled 2015-05-29: qty 2

## 2015-05-29 MED ORDER — FENTANYL CITRATE (PF) 100 MCG/2ML IJ SOLN
INTRAMUSCULAR | Status: DC | PRN
  Administered 2015-05-29: 50 ug via INTRAVENOUS

## 2015-05-29 SURGICAL SUPPLY — 10 items
CATH INFINITI 5 FR JL3.5 (CATHETERS) ×2 IMPLANT
CATH INFINITI JR4 5F (CATHETERS) ×1 IMPLANT
DEVICE RAD COMP TR BAND LRG (VASCULAR PRODUCTS) ×1 IMPLANT
GLIDESHEATH SLEND A-KIT 6F 22G (SHEATH) ×2 IMPLANT
GLIDESHEATH SLEND SS 6F .021 (SHEATH) IMPLANT
KIT HEART LEFT (KITS) ×2 IMPLANT
PACK CARDIAC CATHETERIZATION (CUSTOM PROCEDURE TRAY) ×2 IMPLANT
TRANSDUCER W/STOPCOCK (MISCELLANEOUS) ×2 IMPLANT
TUBING CIL FLEX 10 FLL-RA (TUBING) ×2 IMPLANT
WIRE SAFE-T 1.5MM-J .035X260CM (WIRE) ×1 IMPLANT

## 2015-05-29 NOTE — Progress Notes (Signed)
HOSPITALIST DAILY PROGRESS NOTE Hospital Day:   Patient Name: [name] Admission Date/Time: 05/27/2015  1:34 PM  Age/Sex:75 y.o.female MRN#: [id number]  DOB: 05/01/1940 Attending Provider: Marja Kays, MD    Consulting Physicians:  PCP/Outpatient Specialists: Patient Care Team: Renato Shin, MD as PCP - General (Endocrinology) Neldon Mc, MD as Surgeon (General Surgery)     Brief Narrative:  75 y.o. female with medical history significant of hypertension, hyperlipidemia, insulin-dependent diabetes, breast cancer, presents to the emergency room with a chief complaint of chest pain; cardiac cath 4/26   Assessment:   Active Hospital Problems   Diagnosis Date Noted  . Chest pain 05/27/2015  . Abnormal stress test 05/29/2015    Priority: High  . Dilated cardiomyopathy ([place]) 05/29/2015    Priority: Medium  . Primary cancer of lower-inner quadrant of left female breast ([place]) 02/20/2011  . DM type 2 (diabetes mellitus, type 2) ([place]) 08/10/2007  . HYPERCHOLESTEROLEMIA 12/17/2006  . Essential hypertension 12/17/2006    Resolved Hospital Problems   Diagnosis Date Noted Date Resolved  No resolved problems to display.     Plan:  - appreciate cardiology, just now going to cath lab, likely will stay over night; will see if any CAD - continue asa, statin; check lipids in AM - BP improved, continue losartan and norvasc, cardiology has added coreg - possible addition of lasix, follow up on LVEDP. Dilated CM may be related to ischemic, follow up on cath results.  - sugars are labile, her A1c was very well controlled. She is currently on lantus here and SSI. May have to decrease her home dose or just stick with metformin and/or victoza, on discharge.    I examined the patient and reviewed the chart, labs and data. I discussed the patient's status and plan of care with Patient's treatment team  DVT prophylaxis: lovenox Code Status: Full Code Family Communication:  husband present in room Disposition Plan: Hopefully tomorrow pending cath results and cardiology recs.    Consultants:   Cardiology  Procedures:   Cardiac cath  Antimicrobials:  none  Subjective:  Patient is doing ok today, some left chest pain.    Objective:  Temp:  [97.9 F (36.6 C)-98.1 F (36.7 C)] 98.1 F (36.7 C) (04/26 1445) Pulse Rate:  [52-72] 62 (04/26 1445) Resp:  [16] 16 (04/26 1445) BP: (119-156)/(60-70) 122/70 mmHg (04/26 1445) SpO2:  [95 %-96 %] 96 % (04/26 1445) Weight:  [85.911 kg (189 lb 6.4 oz)] 85.911 kg (189 lb 6.4 oz) (04/26 0500) SpO2 Readings from Last 3 Encounters:  05/29/15 96%  04/18/15 98%  03/02/13 92%    Intake/Output Summary (Last 24 hours) at 05/29/15 1730 Last data filed at 05/29/15 1300  Gross per 24 hour  Intake    480 ml  Output      0 ml  Net    480 ml   Filed Weights   05/27/15 1734 05/28/15 0430 05/29/15 0500  Weight: 87.363 kg (192 lb 9.6 oz) 87.045 kg (191 lb 14.4 oz) 85.911 kg (189 lb 6.4 oz)   Body mass index is 29.66 kg/(m^2).   Physical Exam  Constitutional: She is oriented to person, place, and time. No distress.  Cardiovascular: Normal rate, regular rhythm and normal heart sounds.   Pulmonary/Chest: Effort normal and breath sounds normal. No respiratory distress.  Abdominal: Soft. Bowel sounds are normal. She exhibits no distension.  Musculoskeletal: She exhibits no edema or tenderness.  Neurological: She is alert and oriented to person, place, and  time. GCS score is 15.  Skin: Skin is warm and dry. She is not diaphoretic.       CBC:  Recent Labs Lab 05/27/15 1315  WBC 6.2  HGB 13.8  HCT 42.9  MCV 90.1  PLT 123456    Basic Metabolic Panel:  Recent Labs Lab 05/27/15 1315  NA 141  K 3.7  CL 103  CO2 27  GLUCOSE 162*  BUN 12  CREATININE 0.80  CALCIUM 9.5    GFR: Estimated Creatinine Clearance: 69.4 mL/min (by C-G formula based on Cr of 0.8).  Liver Function Tests: No results for  input(s): AST, ALT, ALKPHOS, BILITOT, PROT, ALBUMIN in the last 168 hours. No results for input(s): LIPASE, AMYLASE in the last 168 hours. No results for input(s): AMMONIA in the last 168 hours.  Coagulation Profile:  Recent Labs Lab 05/29/15 0616  INR 1.04    Cardiac Enzymes:  Recent Labs Lab 05/27/15 1747 05/27/15 2157 05/28/15 0421  TROPONINI <0.03 <0.03 0.03    BNP (last 3 results)  Recent Labs  05/29/15 0816  BNP 90.4    ProBNP (last 3 results) No results for input(s): PROBNP in the last 8760 hours.  Latest HbA1C/Lipids/Thyroid/Anemia: Lab Results  Component Value Date   HGBA1C 6.6* 05/27/2015   CHOL 188 08/26/2012   HDL 52 08/26/2012   LDLCALC 111* 08/26/2012   LDLDIRECT 212.7 02/06/2011   TRIG 123 08/26/2012   CHOLHDL 3.6 08/26/2012   TSH 0.548 05/22/2011   VITAMINB12 228 10/05/2012    CBG:  Recent Labs Lab 05/28/15 2209 05/29/15 0725 05/29/15 1113 05/29/15 1628 05/29/15 1711  GLUCAP 169* 96 222* 68 135*    Urinalysis    Component Value Date/Time   COLORURINE YELLOW 06/25/2011 2040   APPEARANCEUR CLEAR 06/25/2011 2040   LABSPEC 1.012 06/25/2011 2040   PHURINE 7.5 06/25/2011 2040   GLUCOSEU NEGATIVE 06/25/2011 2040   GLUCOSEU NEGATIVE 03/05/2010 1006   HGBUR NEGATIVE 06/25/2011 2040   BILIRUBINUR NEGATIVE 06/25/2011 2040   KETONESUR NEGATIVE 06/25/2011 2040   PROTEINUR NEGATIVE 06/25/2011 2040   UROBILINOGEN 0.2 06/25/2011 2040   NITRITE NEGATIVE 06/25/2011 2040   LEUKOCYTESUR SMALL* 06/25/2011 2040     Radiology Studies: Nm Myocar Multi W/spect W/wall Motion / Ef  05/28/2015   Downsloping ST segment depression ST segment depression was noted during stress in the aVL leads.  Findings consistent with prior myocardial infarction.  This is an intermediate risk study.  The left ventricular ejection fraction is moderately decreased (30-44%).  1. EF 30% with inferior hypokinesis. 2. Fixed medium-sized, moderate intensity basal to  apical inferior perfusion defect.  This suggests prior inferior infarction.  No significant ischemia. 3. Intermediate risk study with decreased LV EF.     Medications:  Scheduled Meds: . acyclovir  200 mg Oral TID  . amLODipine  10 mg Oral Daily  . atorvastatin  40 mg Oral q1800  . carvedilol  3.125 mg Oral BID WC  . dextrose      . enoxaparin (LOVENOX) injection  40 mg Subcutaneous Q24H  . insulin aspart  0-9 Units Subcutaneous TID WC  . insulin glargine  30 Units Subcutaneous QHS  . letrozole  2.5 mg Oral Daily  . losartan  100 mg Oral Daily  . multivitamin with minerals  1 tablet Oral Daily  . sertraline  50 mg Oral Daily  . sodium chloride flush  3 mL Intravenous Q12H   Continuous Infusions: . sodium chloride 1 mL/kg/hr (05/29/15 0716)   PRN Meds:  sodium chloride 250 mL PRN  acetaminophen 650 mg Q4H PRN  albuterol 2.5 mg Q6H PRN  ALPRAZolam 0.25 mg TID PRN  nitroGLYCERIN 0.4 mg Q5 min PRN  ondansetron (ZOFRAN) IV 4 mg Q6H PRN  sodium chloride flush 3 mL PRN      Time spent: 25 minutes   Marja Kays, MD [facility]  If 7PM-7AM, please contact night-coverage [email or web address] Password [email or web address] 05/29/2015, 5:30 PM

## 2015-05-29 NOTE — H&P (View-Only) (Signed)
SUBJECTIVE:  No complaints  OBJECTIVE:   Vitals:   Filed Vitals:   05/28/15 1203 05/28/15 1435 05/28/15 2209 05/29/15 0500  BP: 141/79 122/73 156/61 119/60  Pulse:  80 72 52  Temp:  98.6 F (37 C) 98 F (36.7 C) 97.9 F (36.6 C)  TempSrc:  Oral Oral Oral  Resp:    16  Height:      Weight:    189 lb 6.4 oz (85.911 kg)  SpO2:  96% 96% 95%   I&O's:  No intake or output data in the 24 hours ending 05/29/15 0711 TELEMETRY: Reviewed telemetry pt in NSR:     PHYSICAL EXAM General: Well developed, well nourished, in no acute distress Head: Eyes PERRLA, No xanthomas.   Normal cephalic and atramatic  Lungs:   Clear bilaterally to auscultation and percussion. Heart:   HRRR S1 S2 Pulses are 2+ & equal. Abdomen: Bowel sounds are positive, abdomen soft and non-tender without masses  Extremities:   No clubbing, cyanosis or edema.  DP +1 Neuro: Alert and oriented X 3. Psych:  Good affect, responds appropriately   LABS: Basic Metabolic Panel:  Recent Labs  05/27/15 1315  NA 141  K 3.7  CL 103  CO2 27  GLUCOSE 162*  BUN 12  CREATININE 0.80  CALCIUM 9.5   Liver Function Tests: No results for input(s): AST, ALT, ALKPHOS, BILITOT, PROT, ALBUMIN in the last 72 hours. No results for input(s): LIPASE, AMYLASE in the last 72 hours. CBC:  Recent Labs  05/27/15 1315  WBC 6.2  HGB 13.8  HCT 42.9  MCV 90.1  PLT 203   Cardiac Enzymes:  Recent Labs  05/27/15 1747 05/27/15 2157 05/28/15 0421  TROPONINI <0.03 <0.03 0.03   BNP: Invalid input(s): POCBNP D-Dimer: No results for input(s): DDIMER in the last 72 hours. Hemoglobin A1C:  Recent Labs  05/27/15 1747  HGBA1C 6.6*   Fasting Lipid Panel: No results for input(s): CHOL, HDL, LDLCALC, TRIG, CHOLHDL, LDLDIRECT in the last 72 hours. Thyroid Function Tests: No results for input(s): TSH, T4TOTAL, T3FREE, THYROIDAB in the last 72 hours.  Invalid input(s): FREET3 Anemia Panel: No results for input(s):  VITAMINB12, FOLATE, FERRITIN, TIBC, IRON, RETICCTPCT in the last 72 hours. Coag Panel:   Lab Results  Component Value Date   INR 1.04 05/29/2015   INR 0.99 08/20/2010    RADIOLOGY: Dg Chest 2 View  05/27/2015  CLINICAL DATA:  Chest pain today EXAM: CHEST  2 VIEW COMPARISON:  03/15/2012 FINDINGS: The heart is upper normal in size. Lungs are clear. No pneumothorax or pleural effusion. Normal vascularity. IMPRESSION: Borderline cardiomegaly without decompensation. Electronically Signed   By: Marybelle Killings M.D.   On: 05/27/2015 13:50   Nm Myocar Multi W/spect W/wall Motion / Ef  05/28/2015   Downsloping ST segment depression ST segment depression was noted during stress in the aVL leads.  Findings consistent with prior myocardial infarction.  This is an intermediate risk study.  The left ventricular ejection fraction is moderately decreased (30-44%).  1. EF 30% with inferior hypokinesis. 2. Fixed medium-sized, moderate intensity basal to apical inferior perfusion defect.  This suggests prior inferior infarction.  No significant ischemia. 3. Intermediate risk study with decreased LV EF.    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 Chest Pain: she has  ruled out for MI with negative cardiac enzymes. 2D echo with moderately reduced LVF with EF 35% and nuclear stress test with inferior infarct and EF 30-35%.  I have discussed the findings with the patient and her family. I have recommended proceeding with left heart cath today.  Cardiac catheterization was discussed with the patient fully. The patient understands that risks include but are not limited to stroke (1 in 1000), death (1 in 39), kidney failure [usually temporary] (1 in 500), bleeding (1 in 200), allergic reaction [possibly serious] (1 in  200).  The patient understands and is willing to proceed.  Check BMET this am.  Continue ASA/statin  2. Dyspnea: patient reports SOB with minimal activity at home. Denies lower extremity edema. Denies orthopnea. Chest X ray does not show any pulmonary edema.  Likely to be related to ischemia and LV dysfunction given abnormal nuclear stress test and echo.    3. HTN: BP has been running on the high side again this am. Hypertensive response during chemical stress test. Amlodipine ordered. Also on losartan. Add Coreg 3.125mg  BID and titrate as HR allows.   4. HLD: continue statin.   5.  Dilated CM likely ischemic but could be hypertensive CM as well.  Continue ARB.  Adding Coreg 3.125mg  BID.  Check BNP.  Will likely need Lasix added but will wait and see what LVEDP is at cath.     Sueanne Margarita, MD  05/29/2015  7:11 AM

## 2015-05-29 NOTE — Progress Notes (Signed)
SUBJECTIVE:  No complaints  OBJECTIVE:   Vitals:   Filed Vitals:   05/28/15 1203 05/28/15 1435 05/28/15 2209 05/29/15 0500  BP: 141/79 122/73 156/61 119/60  Pulse:  80 72 52  Temp:  98.6 F (37 C) 98 F (36.7 C) 97.9 F (36.6 C)  TempSrc:  Oral Oral Oral  Resp:    16  Height:      Weight:    189 lb 6.4 oz (85.911 kg)  SpO2:  96% 96% 95%   I&O's:  No intake or output data in the 24 hours ending 05/29/15 0711 TELEMETRY: Reviewed telemetry pt in NSR:     PHYSICAL EXAM General: Well developed, well nourished, in no acute distress Head: Eyes PERRLA, No xanthomas.   Normal cephalic and atramatic  Lungs:   Clear bilaterally to auscultation and percussion. Heart:   HRRR S1 S2 Pulses are 2+ & equal. Abdomen: Bowel sounds are positive, abdomen soft and non-tender without masses  Extremities:   No clubbing, cyanosis or edema.  DP +1 Neuro: Alert and oriented X 3. Psych:  Good affect, responds appropriately   LABS: Basic Metabolic Panel:  Recent Labs  05/27/15 1315  NA 141  K 3.7  CL 103  CO2 27  GLUCOSE 162*  BUN 12  CREATININE 0.80  CALCIUM 9.5   Liver Function Tests: No results for input(s): AST, ALT, ALKPHOS, BILITOT, PROT, ALBUMIN in the last 72 hours. No results for input(s): LIPASE, AMYLASE in the last 72 hours. CBC:  Recent Labs  05/27/15 1315  WBC 6.2  HGB 13.8  HCT 42.9  MCV 90.1  PLT 203   Cardiac Enzymes:  Recent Labs  05/27/15 1747 05/27/15 2157 05/28/15 0421  TROPONINI <0.03 <0.03 0.03   BNP: Invalid input(s): POCBNP D-Dimer: No results for input(s): DDIMER in the last 72 hours. Hemoglobin A1C:  Recent Labs  05/27/15 1747  HGBA1C 6.6*   Fasting Lipid Panel: No results for input(s): CHOL, HDL, LDLCALC, TRIG, CHOLHDL, LDLDIRECT in the last 72 hours. Thyroid Function Tests: No results for input(s): TSH, T4TOTAL, T3FREE, THYROIDAB in the last 72 hours.  Invalid input(s): FREET3 Anemia Panel: No results for input(s):  VITAMINB12, FOLATE, FERRITIN, TIBC, IRON, RETICCTPCT in the last 72 hours. Coag Panel:   Lab Results  Component Value Date   INR 1.04 05/29/2015   INR 0.99 08/20/2010    RADIOLOGY: Dg Chest 2 View  05/27/2015  CLINICAL DATA:  Chest pain today EXAM: CHEST  2 VIEW COMPARISON:  03/15/2012 FINDINGS: The heart is upper normal in size. Lungs are clear. No pneumothorax or pleural effusion. Normal vascularity. IMPRESSION: Borderline cardiomegaly without decompensation. Electronically Signed   By: Marybelle Killings M.D.   On: 05/27/2015 13:50   Nm Myocar Multi W/spect W/wall Motion / Ef  05/28/2015   Downsloping ST segment depression ST segment depression was noted during stress in the aVL leads.  Findings consistent with prior myocardial infarction.  This is an intermediate risk study.  The left ventricular ejection fraction is moderately decreased (30-44%).  1. EF 30% with inferior hypokinesis. 2. Fixed medium-sized, moderate intensity basal to apical inferior perfusion defect.  This suggests prior inferior infarction.  No significant ischemia. 3. Intermediate risk study with decreased LV EF.    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 Chest Pain: she has  ruled out for MI with negative cardiac enzymes. 2D echo with moderately reduced LVF with EF 35% and nuclear stress test with inferior infarct and EF 30-35%.  I have discussed the findings with the patient and her family. I have recommended proceeding with left heart cath today.  Cardiac catheterization was discussed with the patient fully. The patient understands that risks include but are not limited to stroke (1 in 1000), death (1 in 61), kidney failure [usually temporary] (1 in 500), bleeding (1 in 200), allergic reaction [possibly serious] (1 in  200).  The patient understands and is willing to proceed.  Check BMET this am.  Continue ASA/statin  2. Dyspnea: patient reports SOB with minimal activity at home. Denies lower extremity edema. Denies orthopnea. Chest X ray does not show any pulmonary edema.  Likely to be related to ischemia and LV dysfunction given abnormal nuclear stress test and echo.    3. HTN: BP has been running on the high side again this am. Hypertensive response during chemical stress test. Amlodipine ordered. Also on losartan. Add Coreg 3.125mg  BID and titrate as HR allows.   4. HLD: continue statin.   5.  Dilated CM likely ischemic but could be hypertensive CM as well.  Continue ARB.  Adding Coreg 3.125mg  BID.  Check BNP.  Will likely need Lasix added but will wait and see what LVEDP is at cath.     Sueanne Margarita, MD  05/29/2015  7:11 AM

## 2015-05-29 NOTE — Interval H&P Note (Signed)
History and Physical Interval Note: Cath Lab Visit (complete for each Cath Lab visit)  Clinical Evaluation Leading to the Procedure:   ACS: No.  Non-ACS:    Anginal Classification: CCS III  Anti-ischemic medical therapy: Minimal Therapy (1 class of medications)  Non-Invasive Test Results: Intermediate-risk stress test findings: cardiac mortality 1-3%/year  Prior CABG: No previous CABG       05/29/2015 5:35 PM  [name]  has presented today for surgery, with the diagnosis of Pos stress test  The various methods of treatment have been discussed with the patient and family. After consideration of risks, benefits and other options for treatment, the patient has consented to  Procedure(s): Left Heart Cath and Coronary Angiography (N/A) as a surgical intervention .  The patient's history has been reviewed, patient examined, no change in status, stable for surgery.  I have reviewed the patient's chart and labs.  Questions were answered to the patient's satisfaction.     Sinclair Grooms

## 2015-05-30 ENCOUNTER — Encounter (HOSPITAL_COMMUNITY): Payer: Self-pay | Admitting: Internal Medicine

## 2015-05-30 DIAGNOSIS — I420 Dilated cardiomyopathy: Secondary | ICD-10-CM | POA: Diagnosis not present

## 2015-05-30 DIAGNOSIS — R079 Chest pain, unspecified: Secondary | ICD-10-CM | POA: Diagnosis not present

## 2015-05-30 DIAGNOSIS — E119 Type 2 diabetes mellitus without complications: Secondary | ICD-10-CM | POA: Diagnosis not present

## 2015-05-30 DIAGNOSIS — R9439 Abnormal result of other cardiovascular function study: Secondary | ICD-10-CM | POA: Diagnosis not present

## 2015-05-30 DIAGNOSIS — I5021 Acute systolic (congestive) heart failure: Secondary | ICD-10-CM | POA: Diagnosis present

## 2015-05-30 DIAGNOSIS — I10 Essential (primary) hypertension: Secondary | ICD-10-CM | POA: Diagnosis not present

## 2015-05-30 LAB — GLUCOSE, CAPILLARY
Glucose-Capillary: 87 mg/dL (ref 65–99)
Glucose-Capillary: 154 mg/dL — ABNORMAL HIGH (ref 65–99)

## 2015-05-30 LAB — BASIC METABOLIC PANEL
Anion gap: 9 (ref 5–15)
BUN: 12 mg/dL (ref 6–20)
CALCIUM: 8.9 mg/dL (ref 8.9–10.3)
CO2: 26 mmol/L (ref 22–32)
CREATININE: 0.87 mg/dL (ref 0.44–1.00)
Chloride: 108 mmol/L (ref 101–111)
GFR calc Af Amer: >60 mL/min (ref >60)
Glucose, Bld: 98 mg/dL (ref 65–99)
Potassium: 3.9 mmol/L (ref 3.5–5.1)
SODIUM: 143 mmol/L (ref 135–145)

## 2015-05-30 LAB — CBC
HCT: 39.1 % (ref 36.0–46.0)
Hemoglobin: 12.4 g/dL (ref 12.0–15.0)
MCH: 28.5 pg (ref 26.0–34.0)
MCHC: 31.7 g/dL (ref 30.0–36.0)
MCV: 89.9 fL (ref 78.0–100.0)
PLATELETS: 207 10*3/uL (ref 150–400)
RBC: 4.35 MIL/uL (ref 3.87–5.11)
RDW: 13.8 % (ref 11.5–15.5)
WBC: 6.5 10*3/uL (ref 4.0–10.5)

## 2015-05-30 LAB — LIPID PANEL
CHOLESTEROL: 149 mg/dL (ref 0–200)
HDL: 35 mg/dL — AB (ref >40)
LDL Cholesterol: 95 mg/dL (ref 0–99)
Total CHOL/HDL Ratio: 4.3 RATIO
Triglycerides: 94 mg/dL (ref <150)
VLDL: 19 mg/dL (ref 0–40)

## 2015-05-30 MED ORDER — FUROSEMIDE 20 MG PO TABS: 20.0000 mg | ORAL_TABLET | Freq: Every day | ORAL | Status: DC

## 2015-05-30 MED ORDER — CARVEDILOL 3.125 MG PO TABS: 3.1250 mg | ORAL_TABLET | Freq: Two times a day (BID) | ORAL | Status: DC

## 2015-05-30 MED ORDER — FUROSEMIDE 20 MG PO TABS
20.0000 mg | ORAL_TABLET | Freq: Every day | ORAL | Status: DC
Start: 2015-05-30 — End: 2015-05-30

## 2015-05-30 MED ORDER — INSULIN GLARGINE 100 UNIT/ML SOLOSTAR PEN: 30.0000 [IU] | PEN_INJECTOR | Freq: Every day | SUBCUTANEOUS | Status: DC

## 2015-05-30 NOTE — Discharge Instructions (Addendum)
Your A1c (for diabetes) was excellent at 6.6. Please make sure to talk to your doctor about your insulin and diabetic medications.

## 2015-05-30 NOTE — Discharge Summary (Addendum)
Discharge Summary  Patient Name: [name] Admission Date/Time: 05/27/2015  1:34 PM  Age/Sex:75 y.o.female MRN#: [id number]  DOB: [birth year] Attending Provider: Marja Kays, MD        PCP/Outpatient Specialists:  Patient Care Team: Renato Shin, MD as PCP - General (Endocrinology) Neldon Mc, MD as Surgeon (General Surgery) Sueanne Margarita, MD as Consulting Physician (Cardiology)   Admit date: 05/27/2015 Discharge date: 05/30/2015 Length of stay:    Time spent: <30 minutes   Recommendations for Outpatient Follow-up:  1. PCP, 1-2 weeks, hospital follow up. Patient requesting nutrition/dietician referral. Monitor blood sugars and adjust medications as necessary, she may need decreased insulin dose as her A1c is 6.6. 2. Cardiology, as scheduled below  Discharge Diagnoses:  Active Hospital Problems   Diagnosis Date Noted  . Acute systolic CHF (congestive heart failure) ([place]) 05/30/2015  . Dilated cardiomyopathy ([place]) 05/29/2015    Priority: Medium  . Primary cancer of lower-inner quadrant of left female breast ([place]) 02/20/2011  . DM type 2 (diabetes mellitus, type 2) ([place]) 08/10/2007  . HYPERCHOLESTEROLEMIA 12/17/2006  . Essential hypertension 12/17/2006    Resolved Hospital Problems   Diagnosis Date Noted Date Resolved  . Abnormal stress test 05/29/2015 05/30/2015    Priority: High  . Chest pain 05/27/2015 05/30/2015    Discharge Condition:  Good   Discharge Activity: as tolerated  Diet recommendation:  Diabetic Diet, low salt  History of Present Illness:  75 y.o. female with medical history significant of hypertension, hyperlipidemia, insulin-dependent diabetes, breast cancer, presents to the emergency room with a chief complaint of chest pain. Patient says that this morning, while at rest, she experienced sharp midsternal chest pain radiating to her left shoulder. It lasted about 20-30 minutes and went away on its own. She then proceeded with her  today, and went to [employer] to pick up some fern and while she was walking she experience the same chest pain. She rested and it eventually subsided. She received a phone call this morning also and she was told her cholesterol was high, however patient denies stress related to that. She described reflux type pain for a while when she eats greasy food, coffee or chocolate, however this pain was different. She also has a third type of pain following her mastectomy, again different from the pain this morning. She has had chest pain like this in the past, many years ago, and remembers having a stress test but has been a while. She does not have a cardiologist. She endorses nausea, no vomiting. She endorses shortness of breath usually with exertion, and she has difficulties going up a flight of stairs due to dyspnea. She does not exercise regularly. She denies any recent fever or chills. She endorses diarrhea, which is chronic. She denies any abdominal pain. She has a history of tobacco use, was " never a smoker" but more social use. Her brother died of a heart attack in his 8s, he was abusing tobacco and alcohol.  Hospital Course:  Patient admitted as observation for acute coronary syndrome rule out. CEs trended and all negative, EKG nonischemic. She underwent stress test and 2D ECHO (showed EF of 35-40%). Stress test with abnormal with fixed defect (EF 30%) and cardiology was consulted. She underwent cardiac catheterization on 05/29/2015. Fortunately, no coronary artery disease, however she does have depressed LV function with EF of 30-40%. Symptoms felt to be due to new onset acute systolic congestive heart failure. Patient however appeared euvolemic and despite no diuresis, symptomatically she  was improved. No further complaints of chest pain and no c/o SOB. It is possible that her treatment whether radiation or chemotherapy may have induced her CHF. It will need to be followed closely. Coreg and lasix were initiated  by cardiology, she is already on losartan and norvasc. Of note, diabetes was well controlled here and her lantus had to be decreased. She may be able to just take metformin and/or victoza at home as A1c is now 6.6. Defer to her PCP. Otherwise, the patient was stable, had reached maximum benefit of the hospital and was discharged to home.    Procedures:  Cardiac Catheterization 05/30/2015:  Global reduction in LV function with estimated EF in the 30-40% range. Normal LV end-diastolic pressure. Etiology uncertain but could be related to late effect of chemotherapeutic agents used to manage breast cancer.  Left dominant coronary system. Normal coronary arteries.  Recommendations: Therapy for heart failure 75 year old with atypical chest pain, and newly discovered decreased LVEF. Nuclear study raises a question of coronary disease by suggesting possible old inferior infarction. Angiography is being done to define coronary anatomy.  The right radial area was sterilely prepped and draped. Intravenous sedation with Versed and fentanyl was administered. 1% Xylocaine was infiltrated to achieve local analgesia. A double wall stick with an angiocath was utilized to obtain intra-arterial access. The modified Seldinger technique was used to place a 46F " Slender" sheath in the right radial artery. Weight based heparin was administered. Coronary angiography was done using 5 F catheters.  Right coronary angiography was performed with a JR4. Left ventricular hemodymic recordings and angiography was done using the JR 4 catheter and hand injection. Left coronary angiography was performed with a JL 3.5 cm.  Hemostasis was achieved using a pneumatic band.  During this procedure the patient is administered a total of Versed 2 mg and Fentanyl 50 mcg to achieve and maintain moderate conscious sedation. The patient's heart rate, blood pressure, and oxygen saturation are monitored continuously during the procedure. The  period of conscious sedation is 15 minutes, of which I was present face-to-face 100% of this time.Estimated blood loss <50 mL. There were no immediate complications during the procedure.  2D ECHO: Transthoracic Echocardiography  Patient: Salome, Salber MR #: [id number] Study Date: 05/28/2015 Gender: F Age: 89 Height: 172.7 cm Weight: 86.8 kg BSA: 2.06 m^2 Pt. Status: Room: [place], [place], [place], [place] M PERFORMING [facility], Inpatient SONOGRAPHER Darlina Sicilian, RDCS  cc:  ------------------------------------------------------------------- LV EF: 35% - 40%  ------------------------------------------------------------------- Indications: Cardiomegaly 429.3/ I51.2.  ------------------------------------------------------------------- History: PMH: Chest pain. Risk factors: Hypertension. Diabetes mellitus. Dyslipidemia.  ------------------------------------------------------------------- Study Conclusions  - Left ventricle: Endocardial segments are not adequately  visualized to comment on regional wall motion. Recommend limited  study with definity contrast. The cavity size was normal. There  was mild concentric hypertrophy. Systolic function was moderately  reduced. The estimated ejection fraction was in the range of 35%  to 40%. There was an increased relative contribution of atrial  contraction to ventricular filling. Doppler parameters are  consistent with abnormal left ventricular relaxation (grade 1  diastolic dysfunction). - Aortic valve: Moderate focal calcification. - Mitral valve: Valve area by pressure half-time: 0.83 cm^2. - Pulmonic valve: There was mild regurgitation. - Recommendations: Endocardial segments are not adequately  visualized to comment on regional wall motion. Recommend limited  study with  definity contrast.  Recommendations: Endocardial segments are not adequately visualized to comment on regional wall motion. Recommend  limited study with definity contrast. Transthoracic echocardiography. M-mode, complete 2D, spectral Doppler, and color Doppler. Birthdate: Patient birthdate: October 14, 1940. Age: Patient is 75 yr old. Sex: Gender: female. BMI: 29.1 kg/m^2. Blood pressure: 209/82 Patient status: Inpatient. Study date: Study date: 05/28/2015. Study time: 01:17 PM. Location: Bedside.  -------------------------------------------------------------------  ------------------------------------------------------------------- Left ventricle: Endocardial segments are not adequately visualized to comment on regional wall motion. Recommend limited study with definity contrast. The cavity size was normal. There was mild concentric hypertrophy. Systolic function was moderately reduced. The estimated ejection fraction was in the range of 35% to 40%. There was an increased relative contribution of atrial contraction to ventricular filling. Doppler parameters are consistent with abnormal left ventricular relaxation (grade 1 diastolic dysfunction).  ------------------------------------------------------------------- Aortic valve: Trileaflet. Moderate focal calcification. Mobility was not restricted. Doppler: Transvalvular velocity was within the normal range. There was no stenosis. There was no regurgitation.  ------------------------------------------------------------------- Aorta: Aortic root: The aortic root was normal in size.  ------------------------------------------------------------------- Mitral valve: Structurally normal valve. Mobility was not restricted. Doppler: Transvalvular velocity was within the normal range. There was no evidence for stenosis. There was no regurgitation. Valve area by pressure half-time: 0.83  cm^2. Indexed valve area by pressure half-time: 0.4 cm^2/m^2.  ------------------------------------------------------------------- Left atrium: The atrium was normal in size.  ------------------------------------------------------------------- Right ventricle: The cavity size was normal. Wall thickness was normal. Systolic function was normal.  ------------------------------------------------------------------- Pulmonic valve: Structurally normal valve. Cusp separation was normal. Doppler: Transvalvular velocity was within the normal range. There was no evidence for stenosis. There was mild regurgitation.  ------------------------------------------------------------------- Tricuspid valve: Structurally normal valve. Doppler: Transvalvular velocity was within the normal range. There was no regurgitation.  ------------------------------------------------------------------- Pulmonary artery: The main pulmonary artery was normal-sized. Systolic pressure could not be accurately estimated.  ------------------------------------------------------------------- Right atrium: The atrium was normal in size.  ------------------------------------------------------------------- Pericardium: There was no pericardial effusion.  ------------------------------------------------------------------- Systemic veins: Inferior vena cava: The vessel was mildly dilated.  ------------------------------------------------------------------- Measurements  Left ventricle Value Reference LV ID, ED, PLAX chordal 46.92 mm 43 - 52 LV ID, ES, PLAX chordal (H) 42.37 mm 23 - 38 LV fx shortening, PLAX chordal (L) 10 % >=29 LV PW thickness, ED 12.66 mm --------- IVS/LV PW ratio, ED 0.83 <=1.3 LV e&',  lateral 4.57 cm/s --------- LV E/e&', lateral 11.14 --------- LV e&', medial 4.24 cm/s --------- LV E/e&', medial 12 --------- LV e&', average 4.41 cm/s --------- LV E/e&', average 11.56 ---------  Ventricular septum Value Reference IVS thickness, ED 10.55 mm ---------  Aorta Value Reference Aortic root ID 24 mm --------- Aortic root ID, M-L, ED 30.71 mm ---------  Left atrium Value Reference LA ID, A-P, ES 30.83 mm --------- LA ID/bsa, A-P 1.49 cm/m^2 <=2.2 LA volume/bsa, S 38.5 ml/m^2 --------- LA volume, ES, 1-p A4C 28.2 ml --------- LA volume/bsa, ES, 1-p A4C 13.7 ml/m^2 --------- LA volume, ES, 1-p A2C 66.2 ml --------- LA volume/bsa, ES, 1-p A2C 32.1 ml/m^2 ---------  Mitral valve Value Reference Mitral E-wave peak velocity 50.9 cm/s --------- Mitral A-wave peak velocity 113 cm/s --------- Mitral pressure half-time 264 ms --------- Mitral E/A ratio, peak 0.45 --------- Mitral valve area, PHT, DP 0.83 cm^2 --------- Mitral valve area/bsa, PHT, DP 0.4 cm^2/m^2 ---------  Right ventricle Value Reference RV s&', lateral, S 11.6 cm/s ---------  Legend: (L) and (H) mark  values outside specified reference range.  ------------------------------------------------------------------- Prepared and Electronically Authenticated by  Fransico Him, MD 2017-04-25T14:50:07   Consultations:  IP CONSULT TO HOSPITALIST   Cardiology, Dr. Fransico Him  Discharge  Exam: Temp:  [97.4 F (36.3 C)-98.1 F (36.7 C)] 97.4 F (36.3 C) (04/27 0700) Pulse Rate:  [0-93] 52 (04/27 0700) Resp:  [0-19] 18 (04/26 2031) BP: (117-162)/(48-87) 117/48 mmHg (04/27 0700) SpO2:  [0 %-100 %] 95 % (04/27 0700) Weight:  [87.59 kg (193 lb 1.6 oz)] 87.59 kg (193 lb 1.6 oz) (04/27 0700)  Physical Exam  Constitutional: She is oriented to person, place, and time and well-developed, well-nourished, and in no distress. No distress.  Cardiovascular: Normal rate, regular rhythm and normal heart sounds.   No murmur heard. Pulmonary/Chest: Effort normal and breath sounds normal. No respiratory distress. She has no wheezes.  Abdominal: Soft. Bowel sounds are normal. She exhibits no distension. There is no tenderness.  Musculoskeletal: She exhibits no edema or tenderness.  Neurological: She is alert and oriented to person, place, and time. GCS score is 15.  Skin: Skin is warm and dry. She is not diaphoretic. No erythema.     Discharge Instructions You were cared for by a hospitalist during your hospital stay. If you have any questions about your discharge medications or the care you received while you were in the hospital after you are discharged, you can call the unit and asked to speak with the hospitalist on call if the hospitalist that took care of you is not available. Once you are discharged, your primary care physician will handle any further medical issues. Please note that NO REFILLS for any discharge medications will be authorized once you are discharged, as it is imperative that you return to your primary care physician (or establish a relationship with a primary care physician if you  do not have one) for your aftercare needs so that they can reassess your need for medications and monitor your lab values.  Discharge Instructions    (HEART FAILURE PATIENTS) Call MD:  Anytime you have any of the following symptoms: 1) 3 pound weight gain in 24 hours or 5 pounds in 1 week 2) shortness of breath, with or without a dry hacking cough 3) swelling in the hands, feet or stomach 4) if you have to sleep on extra pillows at night in order to breathe.    Complete by:  As directed      Call MD for:  difficulty breathing, headache or visual disturbances    Complete by:  As directed      Call MD for:  extreme fatigue    Complete by:  As directed      Call MD for:  persistant dizziness or light-headedness    Complete by:  As directed      Call MD for:  persistant nausea and vomiting    Complete by:  As directed      Call MD for:  redness, tenderness, or signs of infection (pain, swelling, redness, odor or green/yellow discharge around incision site)    Complete by:  As directed      Call MD for:  severe uncontrolled pain    Complete by:  As directed      Call MD for:  temperature >100.4    Complete by:  As directed      Diet - low sodium heart healthy    Complete by:  As directed      Discharge instructions    Complete by:  As directed   Discuss with your PCP regarding your insulin dose and diabetic meds, may be able to decrease doses.     Increase activity slowly  Complete by:  As directed            Current Discharge Medication List    START taking these medications   Details  carvedilol (COREG) 3.125 MG tablet Take 1 tablet (3.125 mg total) by mouth 2 (two) times daily with a meal. Qty: 14 tablet, Refills: 0    furosemide (LASIX) 20 MG tablet Take 1 tablet (20 mg total) by mouth daily. Qty: 7 tablet, Refills: 0      CONTINUE these medications which have CHANGED   Details  Insulin Glargine (LANTUS) 100 UNIT/ML Solostar Pen Inject 30 Units into the skin at  bedtime. Qty: 60 mL, Refills: 3      CONTINUE these medications which have NOT CHANGED   Details  acyclovir (ZOVIRAX) 200 MG capsule TAKE 2 CAPSULES BY MOUTH 3 TIMES DAILY Qty: 540 capsule, Refills: 3    albuterol (PROVENTIL) (2.5 MG/3ML) 0.083% nebulizer solution Take 2.5 mg by nebulization every 6 (six) hours as needed for shortness of breath.    Associated Diagnoses: Primary cancer of lower-inner quadrant of left female breast ([facility])    ALPRAZolam (XANAX) 0.5 MG tablet TAKE ONE-HALF TO ONE TABLET BY MOUTH THREE TIMES DAILY AS NEEDED FOR  NERVES Qty: 270 tablet, Refills: 1    amLODipine (NORVASC) 10 MG tablet Take 1 tablet (10 mg total) by mouth daily. Qty: 90 tablet, Refills: 3    cholecalciferol (VITAMIN D) 1000 UNITS tablet Take 1 tablet (1,000 Units total) by mouth daily. Qty: 90 tablet, Refills: 3    cyanocobalamin 500 MCG tablet Take 1 tablet (500 mcg total) by mouth daily. Qty: 90 tablet, Refills: 3    letrozole (FEMARA) 2.5 MG tablet TAKE ONE TABLET BY MOUTH DAILY. Qty: 90 tablet, Refills: 12   Associated Diagnoses: Malignant neoplasm of lower-inner quadrant of female breast, left    Liraglutide (VICTOZA) 18 MG/3ML SOPN Inject 3 mLs (18 mg total) into the skin every morning. Qty: 6 mL    losartan (COZAAR) 100 MG tablet Take 1 tablet (100 mg total) by mouth daily. Qty: 90 tablet, Refills: 3    metFORMIN (GLUCOPHAGE) 500 MG tablet Take 500 mg by mouth daily as needed. Patient states she is taking as needed only if her blood sugar is high    Multiple Vitamins-Minerals (ONE-A-DAY WOMENS 50+ ADVANTAGE) TABS Take 1 tablet by mouth daily. Qty: 90 tablet, Refills: 3    omeprazole (PRILOSEC) 40 MG capsule Take 40 mg by mouth daily.    sertraline (ZOLOFT) 50 MG tablet Take 1 tablet (50 mg total) by mouth daily. Qty: 90 tablet, Refills: 3    tolterodine (DETROL LA) 4 MG 24 hr capsule Take 4 mg by mouth.    atorvastatin (LIPITOR) 40 MG tablet Take 1 tablet (40 mg total) by  mouth daily. Qty: 90 tablet, Refills: 3    Blood Glucose Monitoring Suppl (EASY TALK BLOOD GLUCOSE SYSTEM) DEVI Use once daily as directed to check blood glucose    EASY TALK BLOOD GLUCOSE TEST test strip Use to check blood glucose once daily as directed    Insulin Pen Needle (RELION PEN NEEDLE 31G/8MM) 31G X 8 MM MISC 1 Device by Does not apply route 4 (four) times daily. Qty: 120 each, Refills: 11        Allergies  Allergen Reactions  . Cephalexin     REACTION: hives  . Clindamycin     REACTION: hives  . Dilaudid [Hydromorphone Hcl] Itching  . Oxycodone-Acetaminophen     REACTION: hives  .  Rocephin [Ceftriaxone Sodium In Dextrose] Hives  . Sulfonamide Derivatives     REACTION: hives    Follow-up Information    Follow up with Eileen Stanford, PA-C On 06/06/2015.   Specialties:  Cardiology, Radiology   Why:  @ 10:30 am   Contact information:   [place] [address] [phone]        The results of significant diagnostics from this hospitalization (including imaging, microbiology, ancillary and laboratory) are listed below for reference.    Significant Diagnostic Studies: Dg Chest 2 View  05/27/2015  CLINICAL DATA:  Chest pain today EXAM: CHEST  2 VIEW COMPARISON:  03/15/2012 FINDINGS: The heart is upper normal in size. Lungs are clear. No pneumothorax or pleural effusion. Normal vascularity. IMPRESSION: Borderline cardiomegaly without decompensation. Electronically Signed   By: Marybelle Killings M.D.   On: 05/27/2015 13:50   Nm Myocar Multi W/spect W/wall Motion / Ef  05/28/2015   Downsloping ST segment depression ST segment depression was noted during stress in the aVL leads.  Findings consistent with prior myocardial infarction.  This is an intermediate risk study.  The left ventricular ejection fraction is moderately decreased (30-44%).  1. EF 30% with inferior hypokinesis. 2. Fixed medium-sized, moderate intensity basal to apical  inferior perfusion defect.  This suggests prior inferior infarction.  No significant ischemia. 3. Intermediate risk study with decreased LV EF.    Labs: CBC:  Recent Labs Lab 05/27/15 1315 05/29/15 2308 05/30/15 0535  WBC 6.2 7.0 6.5  HGB 13.8 12.5 12.4  HCT 42.9 38.9 39.1  MCV 90.1 89.6 89.9  PLT 203 222 207    CBG:  Recent Labs Lab 05/29/15 1113 05/29/15 1628 05/29/15 1711 05/29/15 2034 05/30/15 0733  GLUCAP 222* 68 135* 152* 87    Basic Metabolic Panel:  Recent Labs Lab 05/27/15 1315 05/29/15 2308 05/30/15 0535  GLUCOSE 162*  --  98  NA 141  --  143  K 3.7  --  3.9  CL 103  --  108  CO2 27  --  26  BUN 12  --  12  CREATININE 0.80 1.05* 0.87  CALCIUM 9.5  --  8.9    Cardiac Enzymes:  Recent Labs Lab 05/27/15 1747 05/27/15 2157 05/28/15 0421  TROPONINI <0.03 <0.03 0.03    BNP (last 3 results)  Recent Labs  05/29/15 0816  BNP 90.4    Signed: Kennedy  [facility] 05/30/2015, 10:01 AM

## 2015-05-30 NOTE — Progress Notes (Signed)
SUBJECTIVE:  No complaints  OBJECTIVE:   Vitals:   Filed Vitals:   05/29/15 2031 05/29/15 2040 05/29/15 2240 05/30/15 0700  BP:  126/87 122/70 117/48  Pulse: 79   52  Temp: 97.9 F (36.6 C)   97.4 F (36.3 C)  TempSrc: Oral   Oral  Resp: 18     Height:      Weight:    193 lb 1.6 oz (87.59 kg)  SpO2: 95%   95%   I&O's:   Intake/Output Summary (Last 24 hours) at 05/30/15 1047 Last data filed at 05/30/15 0900  Gross per 24 hour  Intake    315 ml  Output      0 ml  Net    315 ml   TELEMETRY: Reviewed telemetry pt in NSR:     PHYSICAL EXAM General: Well developed, well nourished, in no acute distress Head: Eyes PERRLA, No xanthomas.   Normal cephalic and atramatic  Lungs:   Clear bilaterally to auscultation and percussion. Heart:   HRRR S1 S2 Pulses are 2+ & equal. Abdomen: Bowel sounds are positive, abdomen soft and non-tender without masses Extremities:   No clubbing, cyanosis or edema.  DP +1 Neuro: Alert and oriented X 3. Psych:  Good affect, responds appropriately   LABS: Basic Metabolic Panel:  Recent Labs  05/27/15 1315 05/29/15 2308 05/30/15 0535  NA 141  --  143  K 3.7  --  3.9  CL 103  --  108  CO2 27  --  26  GLUCOSE 162*  --  98  BUN 12  --  12  CREATININE 0.80 1.05* 0.87  CALCIUM 9.5  --  8.9   Liver Function Tests: No results for input(s): AST, ALT, ALKPHOS, BILITOT, PROT, ALBUMIN in the last 72 hours. No results for input(s): LIPASE, AMYLASE in the last 72 hours. CBC:  Recent Labs  05/29/15 2308 05/30/15 0535  WBC 7.0 6.5  HGB 12.5 12.4  HCT 38.9 39.1  MCV 89.6 89.9  PLT 222 207   Cardiac Enzymes:  Recent Labs  05/27/15 1747 05/27/15 2157 05/28/15 0421  TROPONINI <0.03 <0.03 0.03   BNP: Invalid input(s): POCBNP D-Dimer: No results for input(s): DDIMER in the last 72 hours. Hemoglobin A1C:  Recent Labs  05/27/15 1747  HGBA1C 6.6*   Fasting Lipid Panel:  Recent Labs  05/30/15 0535  CHOL 149  HDL 35*    LDLCALC 95  TRIG 94  CHOLHDL 4.3   Thyroid Function Tests: No results for input(s): TSH, T4TOTAL, T3FREE, THYROIDAB in the last 72 hours.  Invalid input(s): FREET3 Anemia Panel: No results for input(s): VITAMINB12, FOLATE, FERRITIN, TIBC, IRON, RETICCTPCT in the last 72 hours. Coag Panel:   Lab Results  Component Value Date   INR 1.04 05/29/2015   INR 0.99 08/20/2010    RADIOLOGY: Dg Chest 2 View  05/27/2015  CLINICAL DATA:  Chest pain today EXAM: CHEST  2 VIEW COMPARISON:  03/15/2012 FINDINGS: The heart is upper normal in size. Lungs are clear. No pneumothorax or pleural effusion. Normal vascularity. IMPRESSION: Borderline cardiomegaly without decompensation. Electronically Signed   By: Marybelle Killings M.D.   On: 05/27/2015 13:50   Nm Myocar Multi W/spect W/wall Motion / Ef  05/28/2015   Downsloping ST segment depression ST segment depression was noted during stress in the aVL leads.  Findings consistent with prior myocardial infarction.  This is an intermediate risk study.  The left ventricular ejection fraction is moderately decreased (30-44%).  1.  EF 30% with inferior hypokinesis. 2. Fixed medium-sized, moderate intensity basal to apical inferior perfusion defect.  This suggests prior inferior infarction.  No significant ischemia. 3. Intermediate risk study with decreased LV EF.    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 Chest Pain: she has ruled out for MI with negative cardiac enzymes. CP has resolved.  2D echo with moderately reduced LVF with EF 35% and nuclear stress test with inferior infarct and EF 30-35%. Cardiac catheterization showed normal coronary arteries with moderate LV dysfunction.    2. Dyspnea: patient reports SOB with minimal activity at home. Denies lower  extremity edema. Denies orthopnea. Chest X ray does not show any pulmonary edema. 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   SOB has resolved.  3. HTN: BP has been running on the high side again this am. Hypertensive response during chemical stress test. Amlodipine ordered. Also on losartan. Add Coreg 3.125mg  BID and titrate as HR allows.   4. HLD: continue statin.   5. Dilated CM - nonischemic and probably a combination of prior chemotherapy and hypertensive CM as well. Continue ARB and Coreg 3.125mg  BID. Will reassess in office and probably change to [facility].  LVEDP was 77mmHg at cath.  Add Lasix 20mg  daily.  Needs BMET check in 1 week in our office.    Will need followup with me in office in 2 weeks     Sueanne Margarita, MD  05/30/2015  10:47 AM

## 2015-06-05 DIAGNOSIS — I5021 Acute systolic (congestive) heart failure: Secondary | ICD-10-CM | POA: Insufficient documentation

## 2015-06-05 DIAGNOSIS — I429 Cardiomyopathy, unspecified: Secondary | ICD-10-CM | POA: Insufficient documentation

## 2015-06-05 NOTE — Progress Notes (Signed)
Cardiology Office Note    Date:  06/06/2015   ID:  [name], DOB 05-16-1940, MRN [id number]  PCP:  Renato Shin, MD  Cardiologist: Dr. Radford Pax   Post hospital follow up  History of Present Illness:  [name] is a 75 y.o. female with a history of HTN, HLD, DM, previous breast cancer, and recently diagnosed NICM/chronic systolic CHF who presents to clinic for post hospital follow up.   She was admitted from 4/24-4/27/17. She intially presented with chest pain. Patient ruled out for MI.  She underwent stress test and 2D ECHO (showed EF of 35-40%). Stress test with abnormal with fixed defect (EF 30%) and cardiology was consulted. She underwent cardiac catheterization on 05/29/2015. Fortunately, no coronary artery disease, however she does have depressed LV function with EF of 30-40%. Symptoms felt to be due to new onset acute systolic congestive heart failure possibly 2/2 to  radiation or chemotherapy for treatment of previous breast cancer. Coreg and lasix were initiated and she was continued on losartan and norvasc.   Today she presents to clinic for follow up. She has been feeling washed out. She had a small episode of chest pain when she got up yesterday. She continues to feel SOB. She started walking Monday and felt SOB. She does feel winded with very little activity. No LE edema, orthopnea or PND. No dizziness or syncope. Weighing herself daily and watching her diet. Her PCP is helping her get in touch with a nutritionist    Past Medical History  Diagnosis Date  . DM type 2 (diabetes mellitus, type 2) ([place]) 08/10/2007  . 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 HYPERCHOLESTEROLEMIA 12/17/2006  . OBESITY 06/04/2008  . ANXIETY 06/04/2008  . DEPRESSION 08/10/2007  . HYPERTENSION 12/17/2006  . ASTHMATIC BRONCHITIS, ACUTE 06/04/2008  . ALLERGIC RHINITIS 12/13/2007  . GERD 12/17/2006  . DEGENERATIVE JOINT DISEASE 12/17/2006  . Asthma   . Blood transfusion 1964    post childbirth  . 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 History of chemotherapy   . History of radiation therapy 10/07/11-11/24/11    left breast,5040 cGy/28 sessions,l chest wall boost=1000cGy/5 sesions  . 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 Dilated cardiomyopathy ([place]) 05/29/2015  . Chronic systolic CHF (congestive heart failure) ([place]) 05/29/2015    EF 30-40%    Past Surgical History  Procedure Laterality Date  . Laminectomy  1993    s/p-Dr. Rita Ohara  . Bladder tack  1974  . Cholecystectomy  02/23/1991    [facility] - Dr Margot Chimes  . Tonsillectomy    . Rotator cuff repair  8/03    right, Dr. Tonita Cong  . Enterocele repair  06/08    Dr. Cletis Media  . Rotator cuff repair  10/26/2008    left, Dr. Rush Farmer  . Hip surgery  08/27/2010    DR. Maureen Ralphs  . Breast excisional biopsy  11/10/1988    left benign Dr Margot Chimes  . Breast excisional biopsy  03/04/1983    Right - two areas - Dr Margot Chimes  . Breast excisional biopsy  10/09/1980    right - Dr Margot Chimes  . Breast excisional biopsy  10/18/1979    left - Dr Margot Chimes  . Breast excisional biopsy  01/02/1994    right - Dr Margot Chimes  . Vaginal hysterectomy  1974    partial   . Colonoscopy    . Modified radical mastectomy w/ axillary lymph node  dissection Left 03-30-11  . Portacath placement  04/13/2011    Procedure: INSERTION PORT-A-CATH;  Surgeon: Haywood Lasso, MD;  Location: [facility];  Service: General;  Laterality: N/A;  porta cath placement,removal of J-P drain  . Mastectomy Left 03/23/2011  . Drain seroma  05/01/11    Chronic seroma @mastectomy  site  . Evacuation breast hematoma  07/01/2011    Procedure: EVACUATION HEMATOMA BREAST;  Surgeon: Haywood Lasso, MD;  Location: [facility];  Service: General;  Laterality: Left;  Drainage of left mastectomy seroma  . Breast surgery    . Port-a-cath removal  12/02/2011    Procedure: REMOVAL PORT-A-CATH;  Surgeon: Haywood Lasso, MD;  Location: [facility];   Service: General;  Laterality: N/A;  . Cardiac catheterization  05/29/2015    EF 30-40%, normal LVEDP, normal coronaries  . Cardiac catheterization N/A 05/29/2015    Procedure: Left Heart Cath and Coronary Angiography;  Surgeon: Belva Crome, MD;  Location: [facility];  Service: Cardiovascular;  Laterality: N/A;    Current Medications: Outpatient Prescriptions Prior to Visit  Medication Sig Dispense Refill  . acyclovir (ZOVIRAX) 200 MG capsule TAKE 2 CAPSULES BY MOUTH 3 TIMES DAILY 540 capsule 3  . albuterol (PROVENTIL) (2.5 MG/3ML) 0.083% nebulizer solution Take 2.5 mg by nebulization every 6 (six) hours as needed for shortness of breath.     . ALPRAZolam (XANAX) 0.5 MG tablet TAKE ONE-HALF TO ONE TABLET BY MOUTH THREE TIMES DAILY AS NEEDED FOR  NERVES 270 tablet 1  . amLODipine (NORVASC) 10 MG tablet Take 1 tablet (10 mg total) by mouth daily. 90 tablet 3  . Blood Glucose Monitoring Suppl (EASY TALK BLOOD GLUCOSE SYSTEM) DEVI Use once daily as directed to check blood glucose    . carvedilol (COREG) 3.125 MG tablet Take 1 tablet (3.125 mg total) by mouth 2 (two) times daily with a meal. 60 tablet 1  . cholecalciferol (VITAMIN D) 1000 UNITS tablet Take 1 tablet (1,000 Units total) by mouth daily. 90 tablet 3  . cyanocobalamin 500 MCG tablet Take 1 tablet (500 mcg total) by mouth daily. 90 tablet 3  . EASY TALK BLOOD GLUCOSE TEST test strip Use to check blood glucose once daily as directed    . furosemide (LASIX) 20 MG tablet Take 1 tablet (20 mg total) by mouth daily. 30 tablet 1  . Insulin Glargine (LANTUS) 100 UNIT/ML Solostar Pen Inject 30 Units into the skin at bedtime. 60 mL 3  . Insulin Pen Needle (RELION PEN NEEDLE 31G/8MM) 31G X 8 MM MISC 1 Device by Does not apply route 4 (four) times daily. 120 each 11  . letrozole (FEMARA) 2.5 MG tablet TAKE ONE TABLET BY MOUTH DAILY. 90 tablet 12  . Liraglutide (VICTOZA) 18 MG/3ML SOPN Inject 3 mLs (18 mg total) into the skin every morning.  6 mL   . losartan (COZAAR) 100 MG tablet Take 1 tablet (100 mg total) by mouth daily. 90 tablet 3  . metFORMIN (GLUCOPHAGE) 500 MG tablet Take 500 mg by mouth daily as needed. Patient states she is taking as needed only if her blood sugar is high    . Multiple Vitamins-Minerals (ONE-A-DAY WOMENS 50+ ADVANTAGE) TABS Take 1 tablet by mouth daily. 90 tablet 3  . omeprazole (PRILOSEC) 40 MG capsule Take 40 mg by mouth daily.    . sertraline (ZOLOFT) 50 MG tablet Take 1 tablet (50 mg total) by mouth daily. 90 tablet 3  . tolterodine (  DETROL LA) 4 MG 24 hr capsule Take 4 mg by mouth.    Marland Kitchen atorvastatin (LIPITOR) 40 MG tablet Take 1 tablet (40 mg total) by mouth daily. 90 tablet 3  . carvedilol (COREG) 3.125 MG tablet Take 1 tablet (3.125 mg total) by mouth 2 (two) times daily with a meal. 60 tablet 1  . furosemide (LASIX) 20 MG tablet Take 1 tablet (20 mg total) by mouth daily. 30 tablet 1   No facility-administered medications prior to visit.     Allergies:   Cephalexin; Clindamycin; Dilaudid; Oxycodone-acetaminophen; Rocephin; and Sulfonamide derivatives   Social History   Social History  . Marital Status: Married    Spouse Name: N/A  . Number of Children: 3  . Years of Education: N/A   Occupational History  .     Social History Main Topics  . Smoking status: Never Smoker   . Smokeless tobacco: Never Used  . Alcohol Use: No  . Drug Use: No  . Sexual Activity: Yes   Other Topics Concern  . 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   ROS:   Please see the  history of present illness.    ROS All other systems reviewed and are negative.   PHYSICAL EXAM:   VS:  BP 122/66 mmHg  Pulse 95  Ht 5\' 8"  (1.727 m)  Wt 191 lb 12.8 oz (87 kg)  BMI 29.17 kg/m2  SpO2 97%   GEN: Well nourished, well developed, in no acute distressobese  HEENT: normal Neck: no JVD, carotid bruits, or masses Cardiac: RRR; no murmurs, rubs, or gallops,no edema  Respiratory:  clear to auscultation bilaterally, normal work of breathing GI: soft, nontender, nondistended, + BS MS: no deformity or atrophy Skin: warm and dry, no rash Neuro:  Alert and Oriented x 3, Strength and sensation are intact Psych: euthymic mood, full affect  Wt Readings from Last 3 Encounters:  06/06/15 191 lb 12.8 oz (87 kg)  05/30/15 193 lb 1.6 oz (87.59 kg)  04/18/15 196 lb 6.4 oz (89.086 kg)      Studies/Labs Reviewed:   EKG:  EKG is NOT ordered today.    Recent Labs: 04/18/2015: ALT 14 05/29/2015: B Natriuretic Peptide 90.4 05/30/2015: BUN 12; Creatinine, Ser 0.87; Hemoglobin 12.4; Platelets 207; Potassium 3.9; Sodium 143   Lipid Panel    Component Value Date/Time   CHOL 149 05/30/2015 0535   TRIG 94 05/30/2015 0535   HDL 35* 05/30/2015 0535   CHOLHDL 4.3 05/30/2015 0535   VLDL 19 05/30/2015 0535   LDLCALC 95 05/30/2015 0535   LDLDIRECT 212.7 02/06/2011 1018    Additional studies/ records that were reviewed today include:   Cardiac Catheterization 05/30/2015:  Global reduction in LV function with estimated EF in the 30-40% range. Normal LV end-diastolic pressure. Etiology uncertain but could be related to late effect of chemotherapeutic agents used to manage breast cancer.  Left dominant coronary system. Normal coronary arteries.  Recommendations: Therapy for heart failure  2D ECHO: Transthoracic  Echocardiography Study Date: 05/28/2015 LV EF: 35% -   40% Study Conclusions - Left ventricle: Endocardial segments are not adequately   visualized to comment on regional wall  motion. Recommend limited   study with definity contrast. The cavity size was normal. There   was mild concentric hypertrophy. Systolic function was moderately   reduced. The estimated ejection fraction was in the range of 35%   to 40%. There was an increased relative contribution of atrial   contraction to ventricular filling. Doppler parameters are   consistent with abnormal left ventricular relaxation (grade 1   diastolic dysfunction). - Aortic valve: Moderate focal calcification. - Mitral valve: Valve area by pressure half-time: 0.83 cm^2. - Pulmonic valve: There was mild regurgitation. - Recommendations: Endocardial segments are not adequately   visualized to comment on regional wall motion. Recommend limited   study with definity contrast.    Nuclear stress test 07/27/15 Study Result      Downsloping ST segment depression ST segment depression was noted during stress in the aVL leads.  Findings consistent with prior myocardial infarction.  This is an intermediate risk study.  The left ventricular ejection fraction is moderately decreased (30-44%).  1. EF 30% with inferior hypokinesis.  2. Fixed medium-sized, moderate intensity basal to apical inferior perfusion defect. This suggests prior inferior infarction. No significant ischemia.  3. Intermediate risk study with decreased LV EF.       ASSESSMENT:    1. Non-ischemic cardiomyopathy ([place])   2. Essential hypertension   3. HLD (hyperlipidemia)   4. Type 2 diabetes mellitus with complication, unspecified long term insulin use status (HCC)      PLAN:  In order of problems listed above:  NICM/chronic systolic CHF: EF 123456 felt to be 2/2 chemo from breast cancer. Cath with no CAD.  Appears euvolemic. Continue Coreg 3.125mg  BID, losartan 100mg  daily and lasix 20 mg daily. Weight is good. She is down 2 lbs since being discharged. PCP checked electrolytes yesterday. Went over daily weights and low salt diet.    HTN: BP well controlled currently.   HLD: continue statin  T2DM: excellent control. Followed by PCP.   Medication Adjustments/Labs and Tests Ordered: Current medicines are reviewed at length with the patient today.  Concerns regarding medicines are outlined above.  Medication changes, Labs and Tests ordered today are listed in the Patient Instructions below. Patient Instructions  Medication Instructions:  Your physician recommends that you continue on your current medications as directed. Please refer to the Current Medication list given to you today.   Labwork: None ordered  Testing/Procedures: None ordered  Follow-Up: Your physician recommends that you schedule a follow-up appointment in: 6/8 WEEKS WITH DR. Radford Pax   Any Other Special Instructions Will Be Listed Below (If Applicable).     If you need a refill on your cardiac medications before your next appointment, please call your pharmacy.       Renea Ee  06/06/2015 10:23 AM    [facility] [address], [place], [place]  [address] Phone: [phone]; Fax: [phone]

## 2015-06-06 ENCOUNTER — Ambulatory Visit (INDEPENDENT_AMBULATORY_CARE_PROVIDER_SITE_OTHER): Payer: Medicare HMO | Admitting: Physician Assistant

## 2015-06-06 ENCOUNTER — Encounter: Payer: Self-pay | Admitting: Physician Assistant

## 2015-06-06 VITALS — BP 122/66 | HR 95 | Ht 68.0 in | Wt 191.8 lb

## 2015-06-06 DIAGNOSIS — E118 Type 2 diabetes mellitus with unspecified complications: Secondary | ICD-10-CM

## 2015-06-06 DIAGNOSIS — I428 Other cardiomyopathies: Secondary | ICD-10-CM

## 2015-06-06 DIAGNOSIS — I10 Essential (primary) hypertension: Secondary | ICD-10-CM | POA: Diagnosis not present

## 2015-06-06 DIAGNOSIS — E785 Hyperlipidemia, unspecified: Secondary | ICD-10-CM

## 2015-06-06 DIAGNOSIS — I429 Cardiomyopathy, unspecified: Secondary | ICD-10-CM

## 2015-06-06 NOTE — Patient Instructions (Signed)
Medication Instructions:  Your physician recommends that you continue on your current medications as directed. Please refer to the Current Medication list given to you today.   Labwork: None ordered  Testing/Procedures: None ordered  Follow-Up: Your physician recommends that you schedule a follow-up appointment in: 6/8 WEEKS WITH DR. Radford Pax   Any Other Special Instructions Will Be Listed Below (If Applicable).     If you need a refill on your cardiac medications before your next appointment, please call your pharmacy.

## 2015-07-02 ENCOUNTER — Other Ambulatory Visit: Payer: Self-pay

## 2015-07-02 ENCOUNTER — Encounter: Payer: Self-pay | Admitting: Hematology and Oncology

## 2015-07-02 ENCOUNTER — Ambulatory Visit (HOSPITAL_BASED_OUTPATIENT_CLINIC_OR_DEPARTMENT_OTHER): Payer: Medicare HMO | Admitting: Hematology and Oncology

## 2015-07-02 ENCOUNTER — Telehealth: Payer: Self-pay | Admitting: *Deleted

## 2015-07-02 VITALS — BP 130/82 | HR 72 | Temp 98.7°F | Resp 18 | Wt 190.7 lb

## 2015-07-02 DIAGNOSIS — C773 Secondary and unspecified malignant neoplasm of axilla and upper limb lymph nodes: Secondary | ICD-10-CM | POA: Diagnosis not present

## 2015-07-02 DIAGNOSIS — N6452 Nipple discharge: Secondary | ICD-10-CM

## 2015-07-02 DIAGNOSIS — C50312 Malignant neoplasm of lower-inner quadrant of left female breast: Secondary | ICD-10-CM

## 2015-07-02 NOTE — Progress Notes (Signed)
Received VM from pt regarding concerns of right nipple abscess.  Pt wishing to be seen today.  Lindi Adie, MD notified and is able to see patient this afternoon at 1:30pm.  Called pt back regarding appointment time.  Pt states will be able to be here for this appointment time.  POF entered and pt without further questions or concerns at time of call.

## 2015-07-02 NOTE — Progress Notes (Signed)
Patient Care Team: Renato Shin, MD as PCP - General (Endocrinology) Neldon Mc, MD as Surgeon (General Surgery) Sueanne Margarita, MD as Consulting Physician (Cardiology)  DIAGNOSIS: Primary cancer of lower-inner quadrant of left female breast [facility])   Staging form: Breast, AJCC 7th Edition     Clinical: Stage IIB (T3, N0, cM0) - Signed by Haywood Lasso, MD on 03/05/2011       Prognostic indicators: ER 100%  PR 100%  Ki67 18%  Her2 neg 1.31      Pathologic: Stage IIIA (T3, N2a, cM0) - Signed by Haywood Lasso, MD on 03/25/2011       Prognostic indicators: ER 100%  PR 100%  Ki67 18%  Her2 neg 1.31    SUMMARY OF ONCOLOGIC HISTORY:   Primary cancer of lower-inner quadrant of left female breast ([place])   02/20/2011 Initial Diagnosis Left Breast invasive lobular carcinoma; 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 Her clinical staging was stage IIB (T3, N0, M0).     03/23/2011 Surgery L mastectomy + Axillary LND 6 cm ILC with LVI, PNI; 9/16 LN positive with Extracapsular ext ER 100%; PR 100%; Her 2 1.31; Ki 67: 18; pT3a N2a (stage IIIA)   04/17/2011 - 06/19/2011 Chemotherapy FEC X 4 complicated by febrile neutropenis, spesis and chest wall abscess   10/07/2011 - 11/24/2011 Radiation Therapy Left chest wall and axilla 5040 cGy in 28 # with Boost   10/16/2011 -  Anti-estrogen oral therapy Letrozole 2.5 mg po daily   05/27/2015 - 05/30/2015 Hospital Admission Acute systolic CHF, dilated cardiomyopathy, EF 35-40%    CHIEF COMPLIANT: Right nipple discharge  INTERVAL HISTORY: [name] is a 75 year old with above-mentioned history of left mastectomy followed by antiestrogen therapy with letrozole since September 2013. She was recently hospitalized with congestive heart failure with an EF of 35-40%. She has noticed over the past several days of right breast nipple discharge. There was clear serosanguineous nature. She wanted to be seen urgently  for evaluation. She denies any fevers or chills. Denies any breast pain or discomfort. Denies any lumps or nodules.  REVIEW OF SYSTEMS:   Constitutional: Denies fevers, chills or abnormal weight loss Eyes: Denies blurriness of vision Ears, nose, mouth, throat, and face: Denies mucositis or sore throat Respiratory: Denies cough, dyspnea or wheezes Cardiovascular: Denies palpitation, chest discomfort Gastrointestinal:  Denies nausea, heartburn or change in bowel habits Skin: Denies abnormal skin rashes Lymphatics: Denies new lymphadenopathy or easy bruising Neurological:Denies numbness, tingling or new weaknesses Behavioral/Psych: Mood is stable, no new changes  Extremities: No lower extremity edema Breast:  denies any pain or lumps or nodules in either breasts All other systems were reviewed with the patient and are negative.  I have reviewed the past medical history, past surgical history, social history and family history with the patient and they are unchanged from previous note.  ALLERGIES:  is allergic to cephalexin; clindamycin; dilaudid; oxycodone-acetaminophen; rocephin; and sulfonamide derivatives.  MEDICATIONS:  Current Outpatient Prescriptions  Medication Sig Dispense Refill  . acyclovir (ZOVIRAX) 200 MG capsule TAKE 2 CAPSULES BY MOUTH 3 TIMES DAILY 540 capsule 3  . albuterol (PROVENTIL) (2.5 MG/3ML) 0.083% nebulizer solution Take 2.5 mg by nebulization every 6 (six) hours as needed for shortness of breath.     . ALPRAZolam (XANAX) 0.5 MG tablet TAKE ONE-HALF TO ONE TABLET BY MOUTH THREE TIMES DAILY AS NEEDED FOR  NERVES 270 tablet 1  . amLODipine (NORVASC) 10 MG tablet Take 1 tablet (10 mg  total) by mouth daily. 90 tablet 3  . aspirin EC 325 MG tablet Take 325 mg by mouth daily.    Marland Kitchen atorvastatin (LIPITOR) 40 MG tablet Take 40 mg by mouth daily.    . Blood Glucose Monitoring Suppl (EASY TALK BLOOD GLUCOSE SYSTEM) DEVI Use once daily as directed to check blood glucose    .  carvedilol (COREG) 3.125 MG tablet Take 1 tablet (3.125 mg total) by mouth 2 (two) times daily with a meal. 60 tablet 1  . cholecalciferol (VITAMIN D) 1000 UNITS tablet Take 1 tablet (1,000 Units total) by mouth daily. 90 tablet 3  . cyanocobalamin 500 MCG tablet Take 1 tablet (500 mcg total) by mouth daily. 90 tablet 3  . EASY TALK BLOOD GLUCOSE TEST test strip Use to check blood glucose once daily as directed    . furosemide (LASIX) 20 MG tablet Take 1 tablet (20 mg total) by mouth daily. 30 tablet 1  . Insulin Glargine (LANTUS) 100 UNIT/ML Solostar Pen Inject 30 Units into the skin at bedtime. 60 mL 3  . Insulin Pen Needle (RELION PEN NEEDLE 31G/8MM) 31G X 8 MM MISC 1 Device by Does not apply route 4 (four) times daily. 120 each 11  . letrozole (FEMARA) 2.5 MG tablet TAKE ONE TABLET BY MOUTH DAILY. 90 tablet 12  . Liraglutide (VICTOZA) 18 MG/3ML SOPN Inject 3 mLs (18 mg total) into the skin every morning. 6 mL   . losartan (COZAAR) 100 MG tablet Take 1 tablet (100 mg total) by mouth daily. 90 tablet 3  . metFORMIN (GLUCOPHAGE) 500 MG tablet Take 500 mg by mouth daily as needed. Patient states she is taking as needed only if her blood sugar is high    . Multiple Vitamins-Minerals (ONE-A-DAY WOMENS 50+ ADVANTAGE) TABS Take 1 tablet by mouth daily. 90 tablet 3  . omeprazole (PRILOSEC) 40 MG capsule Take 40 mg by mouth daily.    . sertraline (ZOLOFT) 50 MG tablet Take 1 tablet (50 mg total) by mouth daily. 90 tablet 3  . [DISCONTINUED] prochlorperazine (COMPAZINE) 10 MG tablet Take 1 tablet (10 mg total) by mouth every 6 (six) hours as needed (Nausea or vomiting). 30 tablet 1   No current facility-administered medications for this visit.    PHYSICAL EXAMINATION: ECOG PERFORMANCE STATUS: 1 - Symptomatic but completely ambulatory  Filed Vitals:   07/02/15 1344  BP: 130/82  Pulse: 72  Temp: 98.7 F (37.1 C)  Resp: 18   Filed Weights   07/02/15 1344  Weight: 190 lb 11.2 oz (86.501 kg)     GENERAL:alert, no distress and comfortable SKIN: skin color, texture, turgor are normal, no rashes or significant lesions EYES: normal, Conjunctiva are pink and non-injected, sclera clear OROPHARYNX:no exudate, no erythema and lips, buccal mucosa, and tongue normal  NECK: supple, thyroid normal size, non-tender, without nodularity LYMPH:  no palpable lymphadenopathy in the cervical, axillary or inguinal LUNGS: clear to auscultation and percussion with normal breathing effort HEART: regular rate & rhythm and no murmurs and no lower extremity edema ABDOMEN:abdomen soft, non-tender and normal bowel sounds MUSCULOSKELETAL:no cyanosis of digits and no clubbing  NEURO: alert & oriented x 3 with fluent speech, no focal motor/sensory deficits EXTREMITIES: No lower extremity edema BREAST: Right breast nipple discharge whitish in color there no tenderness and no lumps are palpable. No palpable axillary lymphadenopathy.. (exam performed in the presence of a chaperone)  LABORATORY DATA:  I have reviewed the data as listed   Chemistry  Component Value Date/Time   NA 143 05/30/2015 0535   NA 143 04/18/2015 1316   K 3.9 05/30/2015 0535   K 3.5 04/18/2015 1316   CL 108 05/30/2015 0535   CL 102 02/25/2012 0908   CO2 26 05/30/2015 0535   CO2 30* 04/18/2015 1316   BUN 12 05/30/2015 0535   BUN 14.6 04/18/2015 1316   CREATININE 0.87 05/30/2015 0535   CREATININE 0.8 04/18/2015 1316      Component Value Date/Time   CALCIUM 8.9 05/30/2015 0535   CALCIUM 9.8 04/18/2015 1316   ALKPHOS 80 04/18/2015 1316   ALKPHOS 121* 09/12/2013 1104   AST 13 04/18/2015 1316   AST 19 09/12/2013 1104   ALT 14 04/18/2015 1316   ALT 22 09/12/2013 1104   BILITOT 0.39 04/18/2015 1316   BILITOT 0.5 09/12/2013 1104       Lab Results  Component Value Date   WBC 6.5 05/30/2015   HGB 12.4 05/30/2015   HCT 39.1 05/30/2015   MCV 89.9 05/30/2015   PLT 207 05/30/2015   NEUTROABS 3.2 04/18/2015   ASSESSMENT  & PLAN:  Primary cancer of lower-inner quadrant of left female breast ([place]) Left breast invasive lobular cancer diagnosed 02/20/2011 status post left mastectomy with axillary lymph node dissection, 6 cm ILC with LVI, PNI, 9/16 lymph nodes positive with extracapsular extension, ER 100%, PR 100%, HER-2 -1.31, Ki-67 18%, T3a N2 a M0 stage IIIa status post F AC chemotherapy 4 followed by radiation and currently on letrozole 2.5 mg daily since 10/16/2011  Letrozole toxicities: 1. Hot flashes but not very severe 2. Occasional chest tightness  Breast cancer surveillance: 1. Mammogram 09/07/2014 right breast was normal, breast density category C 2. Chest wall and breast exam 07/02/2015  3. Bone density 03/06/2013 showed a T score of -2.1 Boniva was not covered by her insurance and we switched her to Prolia since March 2016. Patient will need another bone density test  Nipple discharge right breast: I will obtain a mammogram and ultrasound for further evaluation. If these are normal, then no further immediate workup may be necessary. If it is persistent and I will make a referral to see general surgery with Dr. Donne Hazel (patient has previously seen Dr. Margot Chimes).   Orders Placed This Encounter  Procedures  . MM DIAG BREAST TOMO UNI RIGHT    EPIC ORDER PF: 09/07/2014 BCG NO NEEDS  [facility]  [facility]  HX BR CA/RT NIPPLE DISCHARGE     Standing Status: Future     Number of Occurrences:      Standing Expiration Date: 08/31/2016    Order Specific Question:  Reason for Exam (SYMPTOM  OR DIAGNOSIS REQUIRED)    Answer:  Right breast nipple discharge, new    Order Specific Question:  Preferred imaging location?    Answer:  [facility]  . US BREAST LTD UNI RIGHT INC AXILLA    Standing Status: Future     Number of Occurrences:      Standing Expiration Date: 08/31/2016    Order Specific Question:  Reason for Exam (SYMPTOM  OR DIAGNOSIS REQUIRED)    Answer:  Right breast nipple discharge, new    Order  Specific Question:  Preferred imaging location?    Answer:  [facility]   The patient has a good understanding of the overall plan. she agrees with it. she will call with any problems that may develop before the next visit here.   Rulon Eisenmenger, MD 07/02/2015

## 2015-07-02 NOTE — Telephone Encounter (Signed)
Appointment added on for today.

## 2015-07-02 NOTE — Assessment & Plan Note (Signed)
Left breast invasive lobular cancer diagnosed 02/20/2011 status post left mastectomy with axillary lymph node dissection, 6 cm ILC with LVI, PNI, 9/16 lymph nodes positive with extracapsular extension, ER 100%, PR 100%, HER-2 -1.31, Ki-67 18%, T3a N2 a M0 stage IIIa status post F AC chemotherapy 4 followed by radiation and currently on letrozole 2.5 mg daily since 10/16/2011  Letrozole toxicities: 1. Hot flashes but not very severe 2. Occasional chest tightness  Breast cancer surveillance: 1. Mammogram 09/07/2014 right breast was normal, breast density category C 2. Chest wall and breast exam 07/02/2015  3. Bone density 03/06/2013 showed a T score of -2.1 Boniva was not covered by her insurance and we switched her to Prolia since March 2016. Patient will need another bone density test  Complaining of abscesses in the right breast:  Return to clinic in 6 months for Prolia follow-up

## 2015-07-02 NOTE — Telephone Encounter (Signed)
Voicemail:  "I need to see Dr. Marye Round today.  I have an abcess to my breast (Rt.) and I've been through stage III with the other breast.  Please call [phone]."

## 2015-07-05 ENCOUNTER — Other Ambulatory Visit: Payer: Self-pay | Admitting: Hematology and Oncology

## 2015-07-05 ENCOUNTER — Ambulatory Visit
Admission: RE | Admit: 2015-07-05 | Discharge: 2015-07-05 | Disposition: A | Discharge status: Home / Self Care | Payer: Medicare HMO | Source: Ambulatory Visit | Attending: Hematology and Oncology | Admitting: Hematology and Oncology

## 2015-07-05 DIAGNOSIS — C50312 Malignant neoplasm of lower-inner quadrant of left female breast: Secondary | ICD-10-CM

## 2015-07-08 ENCOUNTER — Ambulatory Visit
Admission: RE | Admit: 2015-07-08 | Discharge: 2015-07-08 | Disposition: A | Discharge status: Home / Self Care | Payer: Medicare HMO | Source: Ambulatory Visit | Attending: Hematology and Oncology | Admitting: Hematology and Oncology

## 2015-07-08 ENCOUNTER — Other Ambulatory Visit: Payer: Self-pay | Admitting: Hematology and Oncology

## 2015-07-08 DIAGNOSIS — C50312 Malignant neoplasm of lower-inner quadrant of left female breast: Secondary | ICD-10-CM

## 2015-07-17 ENCOUNTER — Ambulatory Visit (INDEPENDENT_AMBULATORY_CARE_PROVIDER_SITE_OTHER): Payer: Medicare HMO | Admitting: Cardiology

## 2015-07-17 ENCOUNTER — Encounter: Payer: Self-pay | Admitting: Cardiology

## 2015-07-17 VITALS — BP 124/72 | HR 80 | Ht 68.0 in | Wt 189.0 lb

## 2015-07-17 DIAGNOSIS — I420 Dilated cardiomyopathy: Secondary | ICD-10-CM

## 2015-07-17 DIAGNOSIS — I10 Essential (primary) hypertension: Secondary | ICD-10-CM

## 2015-07-17 DIAGNOSIS — I5022 Chronic systolic (congestive) heart failure: Secondary | ICD-10-CM | POA: Diagnosis not present

## 2015-07-17 DIAGNOSIS — I5042 Chronic combined systolic (congestive) and diastolic (congestive) heart failure: Secondary | ICD-10-CM | POA: Insufficient documentation

## 2015-07-17 MED ORDER — SPIRONOLACTONE 25 MG PO TABS: 25.0000 mg | ORAL_TABLET | Freq: Every day | ORAL | Status: DC

## 2015-07-17 MED ORDER — AMLODIPINE BESYLATE 5 MG PO TABS: 5.0000 mg | ORAL_TABLET | Freq: Every day | ORAL | Status: DC

## 2015-07-17 MED ORDER — CARVEDILOL 6.25 MG PO TABS: 6.2500 mg | ORAL_TABLET | Freq: Two times a day (BID) | ORAL | Status: DC

## 2015-07-17 NOTE — Progress Notes (Signed)
Cardiology Office Note    Date:  07/17/2015   ID:  KENNADEE [name], DOB [birth year], MRN [id number]  PCP:  Vicenta Aly, FNP  Cardiologist:  Fransico Him, MD   Chief Complaint  Patient presents with  . Cardiomyopathy  . Congestive Heart Failure  . Hypertension    History of Present Illness:  [name] is a 75 y.o. female  with a history of HTN, HLD, DM, previous breast cancer, and recently diagnosed NICM/chronic systolic CHF who presents for follow up.   She was admitted from 4/24-4/27/17. She intially presented with chest pain. Patient ruled out for MI. She underwent stress test and 2D ECHO (showed EF of 35-40%). Stress test with abnormal with fixed defect (EF 30%) and underwent cardiac catheterization on 05/29/2015 with no coronary artery disease, however she does have depressed LV function with EF of 30-40%. Symptoms felt to be due to new onset acute systolic congestive heart failure possibly 2/2 to radiation or chemotherapy for treatment of previous breast cancer. Coreg and lasix were initiated and she was continued on losartan and norvasc.   Today she presents for follow up and is doing well. She is doing fairly well but still gets DOE.  This mainly occurs when she is cleaning house.  She thinks that is about the same as it had been.  Her weight when she left the hospital with 193lbs and this am was 188lbs at home.  She went to the races on Saturday evening in the heat and had some LE edema but not much otherwise.  She denies any chest pain or pressure, dizziness or syncope.  She says that she has been having some problems with her heart racing when she is laying in bed.  She says that it will go very fast.     Past Medical History  Diagnosis Date  . DM type 2 (diabetes mellitus, type 2) ([place]) 08/10/2007  . 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 HYPERCHOLESTEROLEMIA 12/17/2006  . OBESITY 06/04/2008  . ANXIETY 06/04/2008  . DEPRESSION 08/10/2007  . HYPERTENSION 12/17/2006  . ASTHMATIC  BRONCHITIS, ACUTE 06/04/2008  . ALLERGIC RHINITIS 12/13/2007  . GERD 12/17/2006  . DEGENERATIVE JOINT DISEASE 12/17/2006  . Asthma   . Blood transfusion 1964    post childbirth  . 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 History of chemotherapy   . History of radiation therapy 10/07/11-11/24/11    left breast,5040 cGy/28 sessions,l chest wall boost=1000cGy/5 sesions  . 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 Dilated cardiomyopathy ([place]) 05/29/2015    EF 35-40% - felt secondary to XRT and chemo for breast CA  . Chronic combined systolic (congestive) and diastolic (congestive) heart failure ([place]) 05/29/2015    Past Surgical History  Procedure Laterality Date  . Laminectomy  1993    s/p-Dr. Rita Ohara  . Bladder tack  1974  . Cholecystectomy  02/23/1991    [facility] - Dr Margot Chimes  . Tonsillectomy    . Rotator cuff repair  8/03    right, Dr. Tonita Cong  . Enterocele repair  06/08    Dr. Cletis Media  . Rotator cuff repair  10/26/2008    left, Dr. Rush Farmer  . Hip surgery  08/27/2010    DR. Maureen Ralphs  . Breast excisional biopsy  11/10/1988    left benign Dr Margot Chimes  . Breast excisional biopsy  03/04/1983    Right - two areas - Dr Margot Chimes  . Breast  excisional biopsy  10/09/1980    right - Dr Margot Chimes  . Breast excisional biopsy  10/18/1979    left - Dr Margot Chimes  . Breast excisional biopsy  01/02/1994    right - Dr Margot Chimes  . Vaginal hysterectomy  1974    partial   . Colonoscopy    . Modified radical mastectomy w/ axillary lymph node dissection Left 03-30-11  . Portacath placement  04/13/2011    Procedure: INSERTION PORT-A-CATH;  Surgeon: Haywood Lasso, MD;  Location: [facility];  Service: General;  Laterality: N/A;  porta cath placement,removal of J-P drain  . Mastectomy Left 03/23/2011  . Drain seroma  05/01/11    Chronic seroma @mastectomy  site  . Evacuation breast hematoma  07/01/2011    Procedure: EVACUATION HEMATOMA BREAST;  Surgeon:  Haywood Lasso, MD;  Location: [facility];  Service: General;  Laterality: Left;  Drainage of left mastectomy seroma  . Breast surgery    . Port-a-cath removal  12/02/2011    Procedure: REMOVAL PORT-A-CATH;  Surgeon: Haywood Lasso, MD;  Location: [facility];  Service: General;  Laterality: N/A;  . Cardiac catheterization  05/29/2015    EF 30-40%, normal LVEDP, normal coronaries  . Cardiac catheterization N/A 05/29/2015    Procedure: Left Heart Cath and Coronary Angiography;  Surgeon: Belva Crome, MD;  Location: [facility];  Service: Cardiovascular;  Laterality: N/A;    Current Medications: Outpatient Prescriptions Prior to Visit  Medication Sig Dispense Refill  . acyclovir (ZOVIRAX) 200 MG capsule TAKE 2 CAPSULES BY MOUTH 3 TIMES DAILY 540 capsule 3  . albuterol (PROVENTIL) (2.5 MG/3ML) 0.083% nebulizer solution Take 2.5 mg by nebulization every 6 (six) hours as needed for shortness of breath.     . ALPRAZolam (XANAX) 0.5 MG tablet TAKE ONE-HALF TO ONE TABLET BY MOUTH THREE TIMES DAILY AS NEEDED FOR  NERVES 270 tablet 1  . amLODipine (NORVASC) 10 MG tablet Take 1 tablet (10 mg total) by mouth daily. 90 tablet 3  . aspirin EC 325 MG tablet Take 325 mg by mouth daily.    Marland Kitchen atorvastatin (LIPITOR) 40 MG tablet Take 40 mg by mouth daily.    . Blood Glucose Monitoring Suppl (EASY TALK BLOOD GLUCOSE SYSTEM) DEVI Use once daily as directed to check blood glucose    . carvedilol (COREG) 3.125 MG tablet Take 1 tablet (3.125 mg total) by mouth 2 (two) times daily with a meal. 60 tablet 1  . cholecalciferol (VITAMIN D) 1000 UNITS tablet Take 1 tablet (1,000 Units total) by mouth daily. 90 tablet 3  . cyanocobalamin 500 MCG tablet Take 1 tablet (500 mcg total) by mouth daily. 90 tablet 3  . EASY TALK BLOOD GLUCOSE TEST test strip Use to check blood glucose once daily as directed    . furosemide (LASIX) 20 MG tablet Take 1 tablet (20 mg total) by mouth daily. 30 tablet 1  .  Insulin Glargine (LANTUS) 100 UNIT/ML Solostar Pen Inject 30 Units into the skin at bedtime. 60 mL 3  . Insulin Pen Needle (RELION PEN NEEDLE 31G/8MM) 31G X 8 MM MISC 1 Device by Does not apply route 4 (four) times daily. 120 each 11  . letrozole (FEMARA) 2.5 MG tablet TAKE ONE TABLET BY MOUTH DAILY. 90 tablet 12  . Liraglutide (VICTOZA) 18 MG/3ML SOPN Inject 3 mLs (18 mg total) into the skin every morning. 6 mL   . losartan (COZAAR) 100 MG tablet Take 1 tablet (100  mg total) by mouth daily. 90 tablet 3  . metFORMIN (GLUCOPHAGE) 500 MG tablet Take 500 mg by mouth daily as needed. Patient states she is taking as needed only if her blood sugar is high    . Multiple Vitamins-Minerals (ONE-A-DAY WOMENS 50+ ADVANTAGE) TABS Take 1 tablet by mouth daily. 90 tablet 3  . omeprazole (PRILOSEC) 40 MG capsule Take 40 mg by mouth daily.    . sertraline (ZOLOFT) 50 MG tablet Take 1 tablet (50 mg total) by mouth daily. 90 tablet 3   No facility-administered medications prior to visit.     Allergies:   Oxycodone-acetaminophen; Cephalexin; Clindamycin; Dilaudid; Oxycodone-acetaminophen; Rocephin; and Sulfonamide derivatives   Social History   Social History  . Marital Status: Married    Spouse Name: N/A  . Number of Children: 3  . Years of Education: N/A   Occupational History  .     Social History Main Topics  . Smoking status: Never Smoker   . Smokeless tobacco: Never Used  . Alcohol Use: No  . Drug Use: No  . Sexual Activity: Yes   Other Topics Concern  . 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   ROS:   Please see the history of present illness.    ROS All other systems reviewed and are negative.   PHYSICAL EXAM:   VS:  BP 124/72 mmHg  Pulse 80  Ht 5\' 8"  (1.727 m)  Wt 189 lb (85.73 kg)  BMI 28.74 kg/m2   GEN: Well nourished, well developed, in no acute distress HEENT: normal Neck: no JVD, carotid bruits, or masses Cardiac: RRR; no murmurs, rubs, or gallops,no edema.  Intact distal pulses bilaterally.  Respiratory:  clear to auscultation bilaterally, normal work of breathing GI: soft, nontender, nondistended, + BS MS: no deformity or atrophy Skin: warm and dry, no rash Neuro:  Alert and Oriented x 3, Strength and sensation are intact Psych: euthymic mood, full affect  Wt Readings from Last 3 Encounters:  07/17/15 189 lb (85.73 kg)  07/02/15 190 lb 11.2 oz (86.501 kg)  06/06/15 191 lb 12.8 oz (87 kg)      Studies/Labs Reviewed:   EKG:  EKG is ordered today and showed NSR with PVC's, LVH by voltage, septal infarct and nonspecific ST abnormality  Recent Labs: 04/18/2015: ALT 14 05/29/2015: B Natriuretic Peptide 90.4 05/30/2015: BUN 12; Creatinine, Ser 0.87; Hemoglobin 12.4; Platelets 207; Potassium 3.9; Sodium 143   Lipid Panel    Component Value Date/Time   CHOL 149 05/30/2015 0535   TRIG 94 05/30/2015 0535   HDL 35* 05/30/2015 0535   CHOLHDL 4.3 05/30/2015 0535   VLDL 19 05/30/2015 0535   LDLCALC 95 05/30/2015 0535   LDLDIRECT 212.7 02/06/2011 1018    Additional studies/ records that were reviewed today include:  none    ASSESSMENT:    1. Chronic systolic CHF (congestive heart failure) ([place])   2. Dilated cardiomyopathy ([place])   3. Essential hypertension      PLAN:  In order of problems listed above:  1. Chronic combined systolic/diastolic CHF - appears euvolemic on exam today.  Her weight is stable and she has no LE edema and lungs are clear but continues to feel SOB.  I will check a BNP.  Continue Losartan and  diuretic.  Increase Coreg to 6.25mg  BID.  We will continue to titrate CHF meds as BP allows. Add aldactone 25mg  daily and check BMET in 1 week.  Will decrease amlodpine to 5mg  daily to have enough BP to titrate CHF meds.  I will have her followup in HTN/CHF pharmacy clinic in 2 weeks and if doing ok consider changing ARB to [facility].   2. DCM - nonischemic with normal coronary arteries at cath.  EF 35-40%.  Felt secondary to prior radiation and chemotherapy for CA. 3. HTN - BP controlled on current medical therapy.  Continue amlodipien/BB/ARB.    Medication Adjustments/Labs and Tests Ordered: Current medicines are reviewed at length with the patient today.  Concerns regarding medicines are outlined above.  Medication changes, Labs and Tests ordered today are listed in the Patient Instructions below.  There are no Patient Instructions on file for this visit.   Signed, Fransico Him, MD  07/17/2015 11:04 AM    [address], [place], [place]  [address] Phone: [phone]; Fax: [phone]

## 2015-07-17 NOTE — Patient Instructions (Signed)
Medication Instructions:  1) DECREASE AMLODIPINE to 5 mg daily 2) INCREASE COREG to 6.25 mg TWICE DAILY 3) START ALDACTONE 25 mg daily  Labwork: Your physician recommends that you return for lab work in [place].  Testing/Procedures: None  Follow-Up: Your physician recommends that you schedule a follow-up appointment with the HTN CLINIC in 2 WEEKS.  Your physician recommends that you schedule a follow-up appointment in 3 MONTHS with Dr. Radford Pax.  Any Other Special Instructions Will Be Listed Below (If Applicable).     If you need a refill on your cardiac medications before your next appointment, please call your pharmacy.

## 2015-07-22 ENCOUNTER — Other Ambulatory Visit: Payer: Self-pay | Admitting: General Surgery

## 2015-07-22 DIAGNOSIS — N6091 Unspecified benign mammary dysplasia of right breast: Secondary | ICD-10-CM

## 2015-07-24 ENCOUNTER — Other Ambulatory Visit: Payer: Medicare HMO

## 2015-07-24 NOTE — Addendum Note (Signed)
Addended by: Freada Bergeron on: 07/24/2015 05:17 PM   Modules accepted: Orders

## 2015-07-31 ENCOUNTER — Other Ambulatory Visit: Payer: Self-pay | Admitting: General Surgery

## 2015-07-31 ENCOUNTER — Encounter: Payer: Medicare HMO | Admitting: Pharmacist

## 2015-07-31 DIAGNOSIS — N6091 Unspecified benign mammary dysplasia of right breast: Secondary | ICD-10-CM

## 2015-07-31 NOTE — Progress Notes (Signed)
Patient ID: TANGIA OJA                 DOB: 11-Feb-1940                      MRN: [id number]     HPI: [name] is a 75 y.o. female referred by Dr. Radford Pax to pharmacy clinic for CHF medication titration.  She has a PMH significant for HTN, HLD, DM, and recently diagnosed NICM/chronic CHF (EF 30-40%).  She was seen by Dr. Radford Pax on 07/17/15.  She was doing better but still had some DOE.   Dr. Radford Pax started to optimize therapy but limited due to BP.  Her Coreg was increased to 6.25mg  BID, spironolactone 25mg  daily added and amlodipine decreased to 5mg  daily.  She is here today for BP check and further optimization.  Would ultimately like to change patient from  Losartan to [facility].   Current CHF/HTN meds: carvedilol 6.25mg  BID, furosemide 20mg  daily, losartan 100mg  daily, spironolactone 25mg  daily, amlodipine 5mg  daily Previously tried: n/a BP goal: <140/90  Family History:   Social History:   Diet:   Exercise:   Home BP readings:   Wt Readings from Last 3 Encounters:  07/17/15 189 lb (85.73 kg)  07/02/15 190 lb 11.2 oz (86.501 kg)  06/06/15 191 lb 12.8 oz (87 kg)   BP Readings from Last 3 Encounters:  07/17/15 124/72  07/02/15 130/82  06/06/15 122/66   Pulse Readings from Last 3 Encounters:  07/17/15 80  07/02/15 72  06/06/15 95    Renal function: CrCl cannot be calculated (Unknown ideal weight.).  Past Medical History  Diagnosis Date  . DM type 2 (diabetes mellitus, type 2) ([place]) 08/10/2007  . 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 HYPERCHOLESTEROLEMIA 12/17/2006  . OBESITY 06/04/2008  . ANXIETY 06/04/2008  . DEPRESSION 08/10/2007  . HYPERTENSION 12/17/2006  . ASTHMATIC BRONCHITIS, ACUTE 06/04/2008  . ALLERGIC RHINITIS 12/13/2007  . GERD 12/17/2006  . DEGENERATIVE JOINT DISEASE 12/17/2006  . Asthma   . Blood transfusion 1964    post childbirth  . 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 History of chemotherapy   . History of  radiation therapy 10/07/11-11/24/11    left breast,5040 cGy/28 sessions,l chest wall boost=1000cGy/5 sesions  . 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 Dilated cardiomyopathy ([place]) 05/29/2015    EF 35-40% - felt secondary to XRT and chemo for breast CA  . Chronic combined systolic (congestive) and diastolic (congestive) heart failure ([place]) 05/29/2015    Current Outpatient Prescriptions on File Prior to Visit  Medication Sig Dispense Refill  . acyclovir (ZOVIRAX) 200 MG capsule TAKE 2 CAPSULES BY MOUTH 3 TIMES DAILY 540 capsule 3  . albuterol (PROVENTIL) (2.5 MG/3ML) 0.083% nebulizer solution Take 2.5 mg by nebulization every 6 (six) hours as needed for shortness of breath.     . ALPRAZolam (XANAX) 0.5 MG tablet TAKE ONE-HALF TO ONE TABLET BY MOUTH THREE TIMES DAILY AS NEEDED FOR  NERVES 270 tablet 1  . amLODipine (NORVASC) 5 MG tablet Take 1 tablet (5 mg total) by mouth daily. 90 tablet 3  . aspirin EC 325 MG tablet Take 325 mg by mouth daily.    Marland Kitchen atorvastatin (LIPITOR) 40 MG tablet Take 40 mg by mouth daily.    . Blood Glucose Monitoring Suppl (EASY TALK BLOOD GLUCOSE SYSTEM) DEVI Use once daily as directed to check blood  glucose    . carvedilol (COREG) 6.25 MG tablet Take 1 tablet (6.25 mg total) by mouth 2 (two) times daily with a meal. 180 tablet 3  . cholecalciferol (VITAMIN D) 1000 UNITS tablet Take 1 tablet (1,000 Units total) by mouth daily. 90 tablet 3  . cyanocobalamin 500 MCG tablet Take 1 tablet (500 mcg total) by mouth daily. 90 tablet 3  . EASY TALK BLOOD GLUCOSE TEST test strip Use to check blood glucose once daily as directed    . furosemide (LASIX) 20 MG tablet Take 1 tablet (20 mg total) by mouth daily. 30 tablet 1  . Insulin Glargine (LANTUS) 100 UNIT/ML Solostar Pen Inject 30 Units into the skin at bedtime. 60 mL 3  . Insulin Pen Needle (RELION PEN NEEDLE 31G/8MM) 31G X 8 MM MISC 1 Device by Does not apply route 4 (four) times daily. 120 each 11  . letrozole (FEMARA)  2.5 MG tablet TAKE ONE TABLET BY MOUTH DAILY. 90 tablet 12  . Liraglutide (VICTOZA) 18 MG/3ML SOPN Inject 3 mLs (18 mg total) into the skin every morning. 6 mL   . losartan (COZAAR) 100 MG tablet Take 1 tablet (100 mg total) by mouth daily. 90 tablet 3  . metFORMIN (GLUCOPHAGE) 500 MG tablet Take 500 mg by mouth daily as needed. Patient states she is taking as needed only if her blood sugar is high    . Multiple Vitamins-Minerals (ONE-A-DAY WOMENS 50+ ADVANTAGE) TABS Take 1 tablet by mouth daily. 90 tablet 3  . omeprazole (PRILOSEC) 40 MG capsule Take 40 mg by mouth daily.    . sertraline (ZOLOFT) 50 MG tablet Take 1 tablet (50 mg total) by mouth daily. 90 tablet 3  . spironolactone (ALDACTONE) 25 MG tablet Take 1 tablet (25 mg total) by mouth daily. 30 tablet 11  . [DISCONTINUED] prochlorperazine (COMPAZINE) 10 MG tablet Take 1 tablet (10 mg total) by mouth every 6 (six) hours as needed (Nausea or vomiting). 30 tablet 1   No current facility-administered medications on file prior to visit.    Allergies  Allergen Reactions  . Oxycodone-Acetaminophen Hives and Itching  . Cephalexin     REACTION: hives  . Clindamycin     REACTION: hives  . Dilaudid [Hydromorphone Hcl] Itching  . Oxycodone-Acetaminophen     REACTION: hives  . Rocephin [Ceftriaxone Sodium In Dextrose] Hives  . Sulfonamide Derivatives     REACTION: hives     Assessment/Plan:     This encounter was created in error - please disregard.

## 2015-08-01 ENCOUNTER — Encounter (HOSPITAL_BASED_OUTPATIENT_CLINIC_OR_DEPARTMENT_OTHER): Payer: Self-pay | Admitting: *Deleted

## 2015-08-01 NOTE — Progress Notes (Signed)
Chart and Cardiac cath results and 2D echo reviewed by Dr. Al Corpus. Ok for pt's lumpectomy  to be done here at [facility].

## 2015-08-08 ENCOUNTER — Encounter (HOSPITAL_BASED_OUTPATIENT_CLINIC_OR_DEPARTMENT_OTHER)
Admission: RE | Admit: 2015-08-08 | Discharge: 2015-08-08 | Disposition: A | Discharge status: Home / Self Care | Payer: Medicare HMO | Source: Ambulatory Visit | Attending: General Surgery | Admitting: General Surgery

## 2015-08-08 ENCOUNTER — Ambulatory Visit
Admission: RE | Admit: 2015-08-08 | Discharge: 2015-08-08 | Disposition: A | Discharge status: Home / Self Care | Payer: Medicare HMO | Source: Ambulatory Visit | Attending: General Surgery | Admitting: General Surgery

## 2015-08-08 DIAGNOSIS — K219 Gastro-esophageal reflux disease without esophagitis: Secondary | ICD-10-CM | POA: Diagnosis not present

## 2015-08-08 DIAGNOSIS — Z923 Personal history of irradiation: Secondary | ICD-10-CM | POA: Diagnosis not present

## 2015-08-08 DIAGNOSIS — N63 Unspecified lump in breast: Secondary | ICD-10-CM | POA: Diagnosis present

## 2015-08-08 DIAGNOSIS — Z79811 Long term (current) use of aromatase inhibitors: Secondary | ICD-10-CM | POA: Diagnosis not present

## 2015-08-08 DIAGNOSIS — R921 Mammographic calcification found on diagnostic imaging of breast: Secondary | ICD-10-CM | POA: Diagnosis not present

## 2015-08-08 DIAGNOSIS — I5042 Chronic combined systolic (congestive) and diastolic (congestive) heart failure: Secondary | ICD-10-CM | POA: Diagnosis not present

## 2015-08-08 DIAGNOSIS — N6091 Unspecified benign mammary dysplasia of right breast: Secondary | ICD-10-CM

## 2015-08-08 DIAGNOSIS — E7800 Pure hypercholesterolemia, unspecified: Secondary | ICD-10-CM | POA: Diagnosis not present

## 2015-08-08 DIAGNOSIS — E669 Obesity, unspecified: Secondary | ICD-10-CM | POA: Diagnosis not present

## 2015-08-08 DIAGNOSIS — I110 Hypertensive heart disease with heart failure: Secondary | ICD-10-CM | POA: Diagnosis not present

## 2015-08-08 DIAGNOSIS — F419 Anxiety disorder, unspecified: Secondary | ICD-10-CM | POA: Diagnosis not present

## 2015-08-08 DIAGNOSIS — E119 Type 2 diabetes mellitus without complications: Secondary | ICD-10-CM | POA: Diagnosis not present

## 2015-08-08 DIAGNOSIS — J45909 Unspecified asthma, uncomplicated: Secondary | ICD-10-CM | POA: Diagnosis not present

## 2015-08-08 DIAGNOSIS — Z794 Long term (current) use of insulin: Secondary | ICD-10-CM | POA: Diagnosis not present

## 2015-08-08 DIAGNOSIS — Z9221 Personal history of antineoplastic chemotherapy: Secondary | ICD-10-CM | POA: Diagnosis not present

## 2015-08-08 DIAGNOSIS — Z7982 Long term (current) use of aspirin: Secondary | ICD-10-CM | POA: Diagnosis not present

## 2015-08-08 DIAGNOSIS — I420 Dilated cardiomyopathy: Secondary | ICD-10-CM | POA: Diagnosis not present

## 2015-08-08 DIAGNOSIS — Z853 Personal history of malignant neoplasm of breast: Secondary | ICD-10-CM | POA: Diagnosis not present

## 2015-08-08 DIAGNOSIS — F329 Major depressive disorder, single episode, unspecified: Secondary | ICD-10-CM | POA: Diagnosis not present

## 2015-08-08 DIAGNOSIS — Z79899 Other long term (current) drug therapy: Secondary | ICD-10-CM | POA: Diagnosis not present

## 2015-08-08 DIAGNOSIS — L821 Other seborrheic keratosis: Secondary | ICD-10-CM | POA: Diagnosis not present

## 2015-08-08 DIAGNOSIS — Z6828 Body mass index (BMI) 28.0-28.9, adult: Secondary | ICD-10-CM | POA: Diagnosis not present

## 2015-08-08 DIAGNOSIS — Z9012 Acquired absence of left breast and nipple: Secondary | ICD-10-CM | POA: Diagnosis not present

## 2015-08-08 LAB — CBC WITH DIFFERENTIAL/PLATELET
BASOS PCT: 1 %
Basophils Absolute: 0 10*3/uL (ref 0.0–0.1)
EOS ABS: 0.2 10*3/uL (ref 0.0–0.7)
Eosinophils Relative: 3 %
HCT: 43.5 % (ref 36.0–46.0)
Hemoglobin: 14 g/dL (ref 12.0–15.0)
Lymphocytes Relative: 32 %
Lymphs Abs: 2.1 10*3/uL (ref 0.7–4.0)
MCH: 29.3 pg (ref 26.0–34.0)
MCHC: 32.2 g/dL (ref 30.0–36.0)
MCV: 91 fL (ref 78.0–100.0)
MONO ABS: 0.6 10*3/uL (ref 0.1–1.0)
MONOS PCT: 9 %
Neutro Abs: 3.7 10*3/uL (ref 1.7–7.7)
Neutrophils Relative %: 55 %
PLATELETS: 215 10*3/uL (ref 150–400)
RBC: 4.78 MIL/uL (ref 3.87–5.11)
RDW: 13.7 % (ref 11.5–15.5)
WBC: 6.6 10*3/uL (ref 4.0–10.5)

## 2015-08-08 LAB — BASIC METABOLIC PANEL
ANION GAP: 7 (ref 5–15)
BUN: 13 mg/dL (ref 6–20)
CALCIUM: 10.2 mg/dL (ref 8.9–10.3)
CO2: 31 mmol/L (ref 22–32)
CREATININE: 1.1 mg/dL — AB (ref 0.44–1.00)
Chloride: 103 mmol/L (ref 101–111)
GFR calc non Af Amer: 48 mL/min — ABNORMAL LOW (ref >60)
GFR, EST AFRICAN AMERICAN: 55 mL/min — AB (ref >60)
GLUCOSE: 114 mg/dL — AB (ref 65–99)
Potassium: 5.3 mmol/L — ABNORMAL HIGH (ref 3.5–5.1)
Sodium: 141 mmol/L (ref 135–145)

## 2015-08-08 NOTE — Progress Notes (Signed)
Pt given 8 oz water bottle with instructions per [facility]/ Dr Donne Hazel to drink prior to 0400 am morning of surgery. Pt verbalized understanding.

## 2015-08-08 NOTE — Progress Notes (Signed)
Potassium level elevated at 5.3. Dr Kalman Shan aware at 1600 hrs today. Instructed to repeat in am of surgery.

## 2015-08-09 ENCOUNTER — Encounter (HOSPITAL_BASED_OUTPATIENT_CLINIC_OR_DEPARTMENT_OTHER): Payer: Self-pay | Admitting: Certified Registered"

## 2015-08-09 ENCOUNTER — Ambulatory Visit (HOSPITAL_BASED_OUTPATIENT_CLINIC_OR_DEPARTMENT_OTHER): Payer: Medicare HMO | Admitting: Certified Registered"

## 2015-08-09 ENCOUNTER — Ambulatory Visit (HOSPITAL_BASED_OUTPATIENT_CLINIC_OR_DEPARTMENT_OTHER)
Admission: RE | Admit: 2015-08-09 | Discharge: 2015-08-09 | Disposition: A | Discharge status: Home / Self Care | Payer: Medicare HMO | Source: Ambulatory Visit | Attending: General Surgery | Admitting: General Surgery

## 2015-08-09 ENCOUNTER — Ambulatory Visit
Admission: RE | Admit: 2015-08-09 | Discharge: 2015-08-09 | Disposition: A | Discharge status: Home / Self Care | Payer: Medicare HMO | Source: Ambulatory Visit | Attending: General Surgery | Admitting: General Surgery

## 2015-08-09 ENCOUNTER — Encounter (HOSPITAL_BASED_OUTPATIENT_CLINIC_OR_DEPARTMENT_OTHER)
Admission: RE | Disposition: A | Discharge status: Home / Self Care | Payer: Self-pay | Source: Ambulatory Visit | Attending: General Surgery

## 2015-08-09 DIAGNOSIS — L821 Other seborrheic keratosis: Secondary | ICD-10-CM | POA: Diagnosis not present

## 2015-08-09 DIAGNOSIS — E7800 Pure hypercholesterolemia, unspecified: Secondary | ICD-10-CM | POA: Insufficient documentation

## 2015-08-09 DIAGNOSIS — I5042 Chronic combined systolic (congestive) and diastolic (congestive) heart failure: Secondary | ICD-10-CM | POA: Insufficient documentation

## 2015-08-09 DIAGNOSIS — R921 Mammographic calcification found on diagnostic imaging of breast: Secondary | ICD-10-CM | POA: Diagnosis not present

## 2015-08-09 DIAGNOSIS — E119 Type 2 diabetes mellitus without complications: Secondary | ICD-10-CM | POA: Insufficient documentation

## 2015-08-09 DIAGNOSIS — J45909 Unspecified asthma, uncomplicated: Secondary | ICD-10-CM | POA: Insufficient documentation

## 2015-08-09 DIAGNOSIS — Z853 Personal history of malignant neoplasm of breast: Secondary | ICD-10-CM | POA: Insufficient documentation

## 2015-08-09 DIAGNOSIS — Z79899 Other long term (current) drug therapy: Secondary | ICD-10-CM | POA: Insufficient documentation

## 2015-08-09 DIAGNOSIS — N6091 Unspecified benign mammary dysplasia of right breast: Secondary | ICD-10-CM

## 2015-08-09 DIAGNOSIS — Z9221 Personal history of antineoplastic chemotherapy: Secondary | ICD-10-CM | POA: Insufficient documentation

## 2015-08-09 DIAGNOSIS — F419 Anxiety disorder, unspecified: Secondary | ICD-10-CM | POA: Insufficient documentation

## 2015-08-09 DIAGNOSIS — Z79811 Long term (current) use of aromatase inhibitors: Secondary | ICD-10-CM | POA: Insufficient documentation

## 2015-08-09 DIAGNOSIS — F329 Major depressive disorder, single episode, unspecified: Secondary | ICD-10-CM | POA: Insufficient documentation

## 2015-08-09 DIAGNOSIS — Z9012 Acquired absence of left breast and nipple: Secondary | ICD-10-CM | POA: Insufficient documentation

## 2015-08-09 DIAGNOSIS — E669 Obesity, unspecified: Secondary | ICD-10-CM | POA: Insufficient documentation

## 2015-08-09 DIAGNOSIS — I110 Hypertensive heart disease with heart failure: Secondary | ICD-10-CM | POA: Insufficient documentation

## 2015-08-09 DIAGNOSIS — Z923 Personal history of irradiation: Secondary | ICD-10-CM | POA: Insufficient documentation

## 2015-08-09 DIAGNOSIS — I420 Dilated cardiomyopathy: Secondary | ICD-10-CM | POA: Insufficient documentation

## 2015-08-09 DIAGNOSIS — Z6828 Body mass index (BMI) 28.0-28.9, adult: Secondary | ICD-10-CM | POA: Insufficient documentation

## 2015-08-09 DIAGNOSIS — Z794 Long term (current) use of insulin: Secondary | ICD-10-CM | POA: Insufficient documentation

## 2015-08-09 DIAGNOSIS — K219 Gastro-esophageal reflux disease without esophagitis: Secondary | ICD-10-CM | POA: Insufficient documentation

## 2015-08-09 DIAGNOSIS — Z7982 Long term (current) use of aspirin: Secondary | ICD-10-CM | POA: Insufficient documentation

## 2015-08-09 HISTORY — PX: RADIOACTIVE SEED GUIDED EXCISIONAL BREAST BIOPSY: SHX6490

## 2015-08-09 LAB — GLUCOSE, CAPILLARY
GLUCOSE-CAPILLARY: 116 mg/dL — AB (ref 65–99)
Glucose-Capillary: 117 mg/dL — ABNORMAL HIGH (ref 65–99)

## 2015-08-09 SURGERY — RADIOACTIVE SEED GUIDED BREAST BIOPSY
Anesthesia: General | Site: Breast | Laterality: Right

## 2015-08-09 MED ORDER — FENTANYL CITRATE (PF) 100 MCG/2ML IJ SOLN
25.0000 ug | INTRAMUSCULAR | Status: DC | PRN
  Administered 2015-08-09: 50 ug via INTRAVENOUS

## 2015-08-09 MED ORDER — ONDANSETRON HCL 4 MG/2ML IJ SOLN: 4.0000 mg | Freq: Once | INTRAMUSCULAR | Status: DC | PRN

## 2015-08-09 MED ORDER — METHYLENE BLUE 0.5 % INJ SOLN
INTRAVENOUS | Status: AC
  Filled 2015-08-09: qty 10

## 2015-08-09 MED ORDER — SODIUM CHLORIDE 0.9 % IJ SOLN
INTRAMUSCULAR | Status: AC
  Filled 2015-08-09: qty 10

## 2015-08-09 MED ORDER — CHLORHEXIDINE GLUCONATE CLOTH 2 % EX PADS: 6.0000 | MEDICATED_PAD | Freq: Once | CUTANEOUS | Status: DC

## 2015-08-09 MED ORDER — KETOROLAC TROMETHAMINE 30 MG/ML IJ SOLN
15.0000 mg | Freq: Once | INTRAMUSCULAR | Status: AC
  Administered 2015-08-09: 15 mg via INTRAVENOUS

## 2015-08-09 MED ORDER — MIDAZOLAM HCL 2 MG/2ML IJ SOLN
1.0000 mg | INTRAMUSCULAR | Status: DC | PRN
  Administered 2015-08-09: 1 mg via INTRAVENOUS

## 2015-08-09 MED ORDER — CEFAZOLIN SODIUM-DEXTROSE 2-3 GM-% IV SOLR
INTRAVENOUS | Status: DC | PRN
  Administered 2015-08-09: 2 g via INTRAVENOUS

## 2015-08-09 MED ORDER — MIDAZOLAM HCL 2 MG/2ML IJ SOLN
INTRAMUSCULAR | Status: AC
  Filled 2015-08-09: qty 2

## 2015-08-09 MED ORDER — LACTATED RINGERS IV SOLN
INTRAVENOUS | Status: DC
  Administered 2015-08-09 (×2): via INTRAVENOUS

## 2015-08-09 MED ORDER — EPHEDRINE SULFATE 50 MG/ML IJ SOLN
INTRAMUSCULAR | Status: DC | PRN
  Administered 2015-08-09 (×2): 10 mg via INTRAVENOUS

## 2015-08-09 MED ORDER — ONDANSETRON HCL 4 MG/2ML IJ SOLN
INTRAMUSCULAR | Status: DC | PRN
  Administered 2015-08-09: 4 mg via INTRAVENOUS

## 2015-08-09 MED ORDER — FENTANYL CITRATE (PF) 100 MCG/2ML IJ SOLN
INTRAMUSCULAR | Status: AC
  Filled 2015-08-09: qty 2

## 2015-08-09 MED ORDER — KETOROLAC TROMETHAMINE 30 MG/ML IJ SOLN
INTRAMUSCULAR | Status: AC
  Filled 2015-08-09: qty 1

## 2015-08-09 MED ORDER — BACITRACIN 500 UNIT/GM EX OINT
TOPICAL_OINTMENT | CUTANEOUS | Status: DC | PRN
  Administered 2015-08-09: 1 via TOPICAL

## 2015-08-09 MED ORDER — PROPOFOL 500 MG/50ML IV EMUL
INTRAVENOUS | Status: AC
Start: 2015-08-09 — End: 2015-08-09
  Filled 2015-08-09: qty 50

## 2015-08-09 MED ORDER — DEXAMETHASONE SODIUM PHOSPHATE 4 MG/ML IJ SOLN
INTRAMUSCULAR | Status: DC | PRN
  Administered 2015-08-09: 4 mg via INTRAVENOUS

## 2015-08-09 MED ORDER — LIDOCAINE HCL (CARDIAC) 20 MG/ML IV SOLN
INTRAVENOUS | Status: DC | PRN
  Administered 2015-08-09: 60 mg via INTRAVENOUS

## 2015-08-09 MED ORDER — BUPIVACAINE HCL (PF) 0.25 % IJ SOLN
INTRAMUSCULAR | Status: DC | PRN
  Administered 2015-08-09: 12 mL

## 2015-08-09 MED ORDER — BUPIVACAINE HCL (PF) 0.25 % IJ SOLN
INTRAMUSCULAR | Status: AC
  Filled 2015-08-09: qty 60

## 2015-08-09 MED ORDER — SCOPOLAMINE 1 MG/3DAYS TD PT72: 1.0000 | MEDICATED_PATCH | Freq: Once | TRANSDERMAL | Status: DC | PRN

## 2015-08-09 MED ORDER — FENTANYL CITRATE (PF) 100 MCG/2ML IJ SOLN
50.0000 ug | INTRAMUSCULAR | Status: DC | PRN
  Administered 2015-08-09: 50 ug via INTRAVENOUS

## 2015-08-09 MED ORDER — ONDANSETRON HCL 4 MG/2ML IJ SOLN
INTRAMUSCULAR | Status: AC
  Filled 2015-08-09: qty 2

## 2015-08-09 MED ORDER — GLYCOPYRROLATE 0.2 MG/ML IJ SOLN: 0.2000 mg | Freq: Once | INTRAMUSCULAR | Status: DC | PRN

## 2015-08-09 MED ORDER — LIDOCAINE 2% (20 MG/ML) 5 ML SYRINGE
INTRAMUSCULAR | Status: AC
  Filled 2015-08-09: qty 5

## 2015-08-09 MED ORDER — DEXAMETHASONE SODIUM PHOSPHATE 10 MG/ML IJ SOLN
INTRAMUSCULAR | Status: AC
  Filled 2015-08-09: qty 1

## 2015-08-09 MED ORDER — BACITRACIN ZINC 500 UNIT/GM EX OINT
TOPICAL_OINTMENT | CUTANEOUS | Status: AC
  Filled 2015-08-09: qty 0.9

## 2015-08-09 MED ORDER — CEFAZOLIN SODIUM-DEXTROSE 2-4 GM/100ML-% IV SOLN
INTRAVENOUS | Status: AC
  Filled 2015-08-09: qty 100

## 2015-08-09 MED ORDER — PROPOFOL 10 MG/ML IV BOLUS
INTRAVENOUS | Status: DC | PRN
  Administered 2015-08-09: 120 mg via INTRAVENOUS

## 2015-08-09 SURGICAL SUPPLY — 58 items
BINDER BREAST LRG (GAUZE/BANDAGES/DRESSINGS) IMPLANT
BINDER BREAST MEDIUM (GAUZE/BANDAGES/DRESSINGS) IMPLANT
BINDER BREAST XLRG (GAUZE/BANDAGES/DRESSINGS) ×2 IMPLANT
BINDER BREAST XXLRG (GAUZE/BANDAGES/DRESSINGS) IMPLANT
BLADE SURG 15 STRL LF DISP TIS (BLADE) ×1 IMPLANT
BLADE SURG 15 STRL SS (BLADE) ×3
CANISTER SUC SOCK COL 7IN (MISCELLANEOUS) IMPLANT
CANISTER SUCT 1200ML W/VALVE (MISCELLANEOUS) IMPLANT
CHLORAPREP W/TINT 26ML (MISCELLANEOUS) ×3 IMPLANT
CLIP TI WIDE RED SMALL 6 (CLIP) IMPLANT
CLOSURE WOUND 1/2 X4 (GAUZE/BANDAGES/DRESSINGS) ×1
COVER BACK TABLE 60X90IN (DRAPES) ×3 IMPLANT
COVER MAYO STAND STRL (DRAPES) ×3 IMPLANT
COVER PROBE W GEL 5X96 (DRAPES) ×3 IMPLANT
DECANTER SPIKE VIAL GLASS SM (MISCELLANEOUS) IMPLANT
DEVICE DUBIN W/COMP PLATE 8390 (MISCELLANEOUS) ×3 IMPLANT
DRAPE LAPAROSCOPIC ABDOMINAL (DRAPES) ×3 IMPLANT
DRAPE UTILITY XL STRL (DRAPES) ×3 IMPLANT
DRSG TEGADERM 4X4.75 (GAUZE/BANDAGES/DRESSINGS) IMPLANT
ELECT COATED BLADE 2.86 ST (ELECTRODE) ×3 IMPLANT
ELECT REM PT RETURN 9FT ADLT (ELECTROSURGICAL) ×3
ELECTRODE REM PT RTRN 9FT ADLT (ELECTROSURGICAL) ×1 IMPLANT
GLOVE BIO SURGEON STRL SZ7 (GLOVE) ×6 IMPLANT
GLOVE BIOGEL PI IND STRL 7.0 (GLOVE) IMPLANT
GLOVE BIOGEL PI IND STRL 7.5 (GLOVE) ×1 IMPLANT
GLOVE BIOGEL PI INDICATOR 7.0 (GLOVE) ×4
GLOVE BIOGEL PI INDICATOR 7.5 (GLOVE) ×2
GLOVE ECLIPSE 6.5 STRL STRAW (GLOVE) ×2 IMPLANT
GOWN STRL REUS W/ TWL LRG LVL3 (GOWN DISPOSABLE) ×2 IMPLANT
GOWN STRL REUS W/TWL LRG LVL3 (GOWN DISPOSABLE) ×6
ILLUMINATOR WAVEGUIDE N/F (MISCELLANEOUS) IMPLANT
KIT MARKER MARGIN INK (KITS) ×3 IMPLANT
LIGHT WAVEGUIDE WIDE FLAT (MISCELLANEOUS) IMPLANT
LIQUID BAND (GAUZE/BANDAGES/DRESSINGS) ×3 IMPLANT
NDL HYPO 25X1 1.5 SAFETY (NEEDLE) ×1 IMPLANT
NEEDLE HYPO 25X1 1.5 SAFETY (NEEDLE) ×3 IMPLANT
NS IRRIG 1000ML POUR BTL (IV SOLUTION) ×2 IMPLANT
PACK BASIN DAY SURGERY FS (CUSTOM PROCEDURE TRAY) ×3 IMPLANT
PENCIL BUTTON HOLSTER BLD 10FT (ELECTRODE) ×3 IMPLANT
SHEET MEDIUM DRAPE 40X70 STRL (DRAPES) IMPLANT
SLEEVE SCD COMPRESS KNEE MED (MISCELLANEOUS) ×3 IMPLANT
SPONGE GAUZE 4X4 12PLY STER LF (GAUZE/BANDAGES/DRESSINGS) IMPLANT
SPONGE LAP 4X18 X RAY DECT (DISPOSABLE) ×3 IMPLANT
STRIP CLOSURE SKIN 1/2X4 (GAUZE/BANDAGES/DRESSINGS) ×2 IMPLANT
SUT MNCRL AB 4-0 PS2 18 (SUTURE) IMPLANT
SUT MON AB 5-0 PS2 18 (SUTURE) ×3 IMPLANT
SUT SILK 2 0 SH (SUTURE) IMPLANT
SUT VIC AB 2-0 SH 27 (SUTURE) ×3
SUT VIC AB 2-0 SH 27XBRD (SUTURE) ×1 IMPLANT
SUT VIC AB 3-0 SH 27 (SUTURE) ×3
SUT VIC AB 3-0 SH 27X BRD (SUTURE) ×1 IMPLANT
SUT VIC AB 5-0 PS2 18 (SUTURE) IMPLANT
SYR CONTROL 10ML LL (SYRINGE) ×3 IMPLANT
TOWEL OR 17X24 6PK STRL BLUE (TOWEL DISPOSABLE) ×3 IMPLANT
TOWEL OR NON WOVEN STRL DISP B (DISPOSABLE) ×3 IMPLANT
TUBE CONNECTING 20'X1/4 (TUBING)
TUBE CONNECTING 20X1/4 (TUBING) IMPLANT
YANKAUER SUCT BULB TIP NO VENT (SUCTIONS) IMPLANT

## 2015-08-09 NOTE — Anesthesia Procedure Notes (Signed)
Procedure Name: LMA Insertion Date/Time: 08/09/2015 7:31 AM Performed by: Arrie Borrelli D Pre-anesthesia Checklist: Patient identified, Emergency Drugs available, Suction available and Patient being monitored Patient Re-evaluated:Patient Re-evaluated prior to inductionOxygen Delivery Method: Circle system utilized Preoxygenation: Pre-oxygenation with 100% oxygen Intubation Type: IV induction Ventilation: Mask ventilation without difficulty LMA: LMA inserted LMA Size: 4.0 Number of attempts: 1 Airway Equipment and Method: Bite block Placement Confirmation: positive ETCO2 Tube secured with: Tape Dental Injury: Teeth and Oropharynx as per pre-operative assessment

## 2015-08-09 NOTE — Anesthesia Preprocedure Evaluation (Addendum)
Anesthesia Evaluation  Patient identified by MRN, date of birth, ID band Patient awake    Reviewed: Allergy & Precautions, NPO status , Patient's Chart, lab work & pertinent test results  Airway Mallampati: II       Dental   Pulmonary asthma ,    breath sounds clear to auscultation       Cardiovascular hypertension, Pt. on medications +CHF   Rhythm:Regular Rate:Normal  Echo 4/17 EF- 35-40percent   Neuro/Psych PSYCHIATRIC DISORDERS Anxiety Depression  Neuromuscular disease    GI/Hepatic GERD  Medicated,  Endo/Other  diabetes, Insulin Dependent  Renal/GU      Musculoskeletal  (+) Arthritis ,   Abdominal   Peds  Hematology   Anesthesia Other Findings   Reproductive/Obstetrics                           Anesthesia Physical Anesthesia Plan  ASA: III  Anesthesia Plan: General   Post-op Pain Management:    Induction: Intravenous  Airway Management Planned: LMA  Additional Equipment:   Intra-op Plan:   Post-operative Plan:   Informed Consent: I have reviewed the patients History and Physical, chart, labs and discussed the procedure including the risks, benefits and alternatives for the proposed anesthesia with the patient or authorized representative who has indicated his/her understanding and acceptance.     Plan Discussed with: CRNA and Anesthesiologist  Anesthesia Plan Comments:         Anesthesia Quick Evaluation

## 2015-08-09 NOTE — Discharge Instructions (Signed)
[facility] °Office Phone Number [phone] ° °POST OP INSTRUCTIONS ° °Always review your discharge instruction sheet given to you by the facility where your surgery was performed. ° °IF YOU HAVE DISABILITY OR FAMILY LEAVE FORMS, YOU MUST BRING THEM TO THE OFFICE FOR PROCESSING.  DO NOT GIVE THEM TO YOUR DOCTOR. ° °1. A prescription for pain medication may be given to you upon discharge.  Take your pain medication as prescribed, if needed.  If narcotic pain medicine is not needed, then you may take acetaminophen (Tylenol), naprosyn (Alleve) or ibuprofen (Advil) as needed. °2. Take your usually prescribed medications unless otherwise directed °3. If you need a refill on your pain medication, please contact your pharmacy.  They will contact our office to request authorization.  Prescriptions will not be filled after 5pm or on week-ends. °4. You should eat very light the first 24 hours after surgery, such as soup, crackers, pudding, etc.  Resume your normal diet the day after surgery. °5. Most patients will experience some swelling and bruising in the breast.  Ice packs and a good support bra will help.  Wear the breast binder provided or a sports bra for 72 hours day and night.  After that wear a sports bra during the day until you return to the office. Swelling and bruising can take several days to resolve.  °6. It is common to experience some constipation if taking pain medication after surgery.  Increasing fluid intake and taking a stool softener will usually help or prevent this problem from occurring.  A mild laxative (Milk of Magnesia or Miralax) should be taken according to package directions if there are no bowel movements after 48 hours. °7. Unless discharge instructions indicate otherwise, you may remove your bandages 48 hours after surgery and you may shower at that time.  You may have steri-strips (small skin tapes) in place directly over the incision.  These strips should be left on the  skin for 7-10 days and will come off on their own.  If your surgeon used skin glue on the incision, you may shower in 24 hours.  The glue will flake off over the next 2-3 weeks.  Any sutures or staples will be removed at the office during your follow-up visit. °8. ACTIVITIES:  You may resume regular daily activities (gradually increasing) beginning the next day.  Wearing a good support bra or sports bra minimizes pain and swelling.  You may have sexual intercourse when it is comfortable. °a. You may drive when you no longer are taking prescription pain medication, you can comfortably wear a seatbelt, and you can safely maneuver your car and apply brakes. °b. RETURN TO WORK:  ______________________________________________________________________________________ °9. You should see your doctor in the office for a follow-up appointment approximately two weeks after your surgery.  Your doctor’s nurse will typically make your follow-up appointment when she calls you with your pathology report.  Expect your pathology report 3-4 business days after your surgery.  You may call to check if you do not hear from us after three days. °10. OTHER INSTRUCTIONS: _______________________________________________________________________________________________ _____________________________________________________________________________________________________________________________________ °_____________________________________________________________________________________________________________________________________ °_____________________________________________________________________________________________________________________________________ ° °WHEN TO CALL DR WAKEFIELD: °1. Fever over 101.0 °2. Nausea and/or vomiting. °3. Extreme swelling or bruising. °4. Continued bleeding from incision. °5. Increased pain, redness, or drainage from the incision. ° °The clinic staff is available to answer your questions during regular  business hours.  Please don’t hesitate to call and ask to speak to one of the nurses for clinical concerns.  If   you have a medical emergency, go to the nearest emergency room or call 911.  A surgeon from [facility] is always on call at the hospital. ° °For further questions, please visit [email or web address] ° ° ° °Post Anesthesia Home Care Instructions ° °Activity: °Get plenty of rest for the remainder of the day. A responsible adult should stay with you for 24 hours following the procedure.  °For the next 24 hours, DO NOT: °-Drive a car °-Operate machinery °-Drink alcoholic beverages °-Take any medication unless instructed by your physician °-Make any legal decisions or sign important papers. ° °Meals: °Start with liquid foods such as gelatin or soup. Progress to regular foods as tolerated. Avoid greasy, spicy, heavy foods. If nausea and/or vomiting occur, drink only clear liquids until the nausea and/or vomiting subsides. Call your physician if vomiting continues. ° °Special Instructions/Symptoms: °Your throat may feel dry or sore from the anesthesia or the breathing tube placed in your throat during surgery. If this causes discomfort, gargle with warm salt water. The discomfort should disappear within 24 hours. ° °If you had a scopolamine patch placed behind your ear for the management of post- operative nausea and/or vomiting: ° °1. The medication in the patch is effective for 72 hours, after which it should be removed.  Wrap patch in a tissue and discard in the trash. Wash hands thoroughly with soap and water. °2. You may remove the patch earlier than 72 hours if you experience unpleasant side effects which may include dry mouth, dizziness or visual disturbances. °3. Avoid touching the patch. Wash your hands with soap and water after contact with the patch. °  ° °

## 2015-08-09 NOTE — Anesthesia Postprocedure Evaluation (Signed)
Anesthesia Post Note  Patient: [name]  Procedure(s) Performed: Procedure(s) (LRB): RIGHT RADIOACTIVE SEED GUIDED EXCISIONAL BREAST BIOPSY, RIGHT NIPPLE LESION EXCISION (Right)  Patient location during evaluation: PACU Anesthesia Type: General Level of consciousness: awake, awake and alert and oriented Pain management: pain level controlled Vital Signs Assessment: post-procedure vital signs reviewed and stable Respiratory status: spontaneous breathing, nonlabored ventilation and respiratory function stable Cardiovascular status: blood pressure returned to baseline Anesthetic complications: no    Last Vitals:  Filed Vitals:   08/09/15 0813 08/09/15 0815  BP: 144/69 131/68  Pulse: 87 95  Temp:  36.6 C  Resp:  9    Last Pain:  Filed Vitals:   08/09/15 0828  PainSc: 0-No pain                 Aruna Nestler COKER

## 2015-08-09 NOTE — Op Note (Signed)
Preoperative diagnoses:right breast mass with core biopsy c/w adh, right nipple lesion Postoperative diagnosis: Same as above Procedure:Right breast seed guided excisional biopsy, excision right nipple mass Surgeon: Dr. Serita Grammes Anesthesia: Gen. Estimated blood loss: Minimal Complications: None Drains: None Specimens:right breast tissue marked with paint, right nipple lesion Sponge and needle count correct at completion Disposition to recovery stable  Indications: This is a 17 yof with history of left breast cancer who presents with a right nipple lesion that is draining as well as mm mass that underwent core biopsy with ADH.  We discussed excision of nipple lesion and seed guided excision of adh.  She had seed placed prior to surgery.  I had her mm in the OR.   Procedure:After informed consent was obtained she was then taken to the operating room. She was given cefazolin. Sequential compression devices on her legs. She was placed under general anesthesia without complication. Her right breast was then prepped and draped in the standard sterile surgical fashion. A surgical timeout was then performed.  I located the radioactive seed with the neoprobe.I infiltrated marcaine in the periareolar area and then made a periareolar incision. I then used the neoprobe to guide the excision of the seed and surrounding tissue. . This was confirmed by the neoprobe. This was painted. This was then taken for mammogram which confirmed removal of the clip and the seed. This was confirmed by radiology. This was then sent to pathology. Hemostasis was observed. I closed the breast tissue with a 2-0 Vicryl. The dermis was closed with 3-0 Vicryl the skin with 5-0 Monocryl. I also used the knife to shave off the nipple mass.  This contained some white material and all this was sent to pathology. Bacitracin was placed on this area.  Dermabond and steristrips were placed on the incision. She was transferred to  recovery stable.

## 2015-08-09 NOTE — H&P (Signed)
[name] is an 75 y.o. female.   Chief Complaint: right breast mass and right nipple lesion  HPI:  50 yof presents with history of left mrm by Dr Margot Chimes previously followed by chemo/rads now on antiestrogens. she has a small area on nipple that looks like a pimple that she can express some drainage from. not really nipple dc. she was evaluated by mm that shows c density breasts. mm was normal. [place] of subareolar breast hypoechoic mass at 11 oclock measuring 7x4x5 mm. a 5-6 mm mass was seen within in the skin of nipple. biopsy was done with clip placed of the breast mass. this was adh.   Past Medical History  Diagnosis Date  . DM type 2 (diabetes mellitus, type 2) ([place]) 08/10/2007  . 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 HYPERCHOLESTEROLEMIA 12/17/2006  . OBESITY 06/04/2008  . ANXIETY 06/04/2008  . DEPRESSION 08/10/2007  . HYPERTENSION 12/17/2006  . ASTHMATIC BRONCHITIS, ACUTE 06/04/2008  . ALLERGIC RHINITIS 12/13/2007  . GERD 12/17/2006  . DEGENERATIVE JOINT DISEASE 12/17/2006  . Asthma   . Blood transfusion 1964    post childbirth  . 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 History of chemotherapy   . History of radiation therapy 10/07/11-11/24/11    left breast,5040 cGy/28 sessions,l chest wall boost=1000cGy/5 sesions  . 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 Dilated cardiomyopathy ([place]) 05/29/2015    EF 35-40% - felt secondary to XRT and chemo for breast CA  . Chronic combined systolic (congestive) and diastolic (congestive) heart failure ([place]) 05/29/2015    Past Surgical History  Procedure Laterality Date  . Laminectomy  1993    s/p-Dr. Rita Ohara  . Bladder tack  1974  . Cholecystectomy  02/23/1991    [facility] - Dr Margot Chimes  . Tonsillectomy    . Rotator cuff repair  8/03    right, Dr. Tonita Cong  . Enterocele repair  06/08    Dr. Cletis Media  . Rotator cuff repair  10/26/2008    left, Dr. Rush Farmer  . Hip surgery  08/27/2010    DR. Maureen Ralphs  . Breast  excisional biopsy  11/10/1988    left benign Dr Margot Chimes  . Breast excisional biopsy  03/04/1983    Right - two areas - Dr Margot Chimes  . Breast excisional biopsy  10/09/1980    right - Dr Margot Chimes  . Breast excisional biopsy  10/18/1979    left - Dr Margot Chimes  . Breast excisional biopsy  01/02/1994    right - Dr Margot Chimes  . Vaginal hysterectomy  1974    partial   . Colonoscopy    . Modified radical mastectomy w/ axillary lymph node dissection Left 03-30-11  . Portacath placement  04/13/2011    Procedure: INSERTION PORT-A-CATH;  Surgeon: Haywood Lasso, MD;  Location: [facility];  Service: General;  Laterality: N/A;  porta cath placement,removal of J-P drain  . Mastectomy Left 03/23/2011  . Evacuation breast hematoma  07/01/2011    Procedure: EVACUATION HEMATOMA BREAST;  Surgeon: Haywood Lasso, MD;  Location: [facility];  Service: General;  Laterality: Left;  Drainage of left mastectomy seroma  . Breast surgery    . Port-a-cath removal  12/02/2011    Procedure: REMOVAL PORT-A-CATH;  Surgeon: Haywood Lasso, MD;  Location: [facility];  Service: General;  Laterality: N/A;  . Cardiac catheterization N/A 05/29/2015    Procedure: Left Heart Cath and Coronary  Angiography;  Surgeon: Belva Crome, MD;  Location: [facility];  Service: Cardiovascular;  Laterality: N/A;    Family History  Problem Relation Age of Onset  . COPD Mother   . Arthritis Mother   . Emphysema Mother   . Mental illness Paternal Grandfather   . Heart disease Other     Sibling  . Diabetes Other     Sibling   . Diabetes Brother   . Cancer Brother     prostate  . Cancer Father     malignant brain tumor   Social History:  reports that she has never smoked. She has never used smokeless tobacco. She reports that she does not drink alcohol or use illicit drugs.  Allergies:  Allergies  Allergen Reactions  . Oxycodone-Acetaminophen Hives and Itching  . Cephalexin     REACTION: hives   . Clindamycin     REACTION: hives  . Dilaudid [Hydromorphone Hcl] Itching  . Oxycodone-Acetaminophen     REACTION: hives  . Rocephin [Ceftriaxone Sodium In Dextrose] Hives  . Sulfonamide Derivatives     REACTION: hives    Medications Prior to Admission  Medication Sig Dispense Refill  . acyclovir (ZOVIRAX) 200 MG capsule TAKE 2 CAPSULES BY MOUTH 3 TIMES DAILY 540 capsule 3  . amLODipine (NORVASC) 5 MG tablet Take 1 tablet (5 mg total) by mouth daily. 90 tablet 3  . aspirin EC 325 MG tablet Take 325 mg by mouth daily.    Marland Kitchen atorvastatin (LIPITOR) 40 MG tablet Take 40 mg by mouth daily.    . Blood Glucose Monitoring Suppl (EASY TALK BLOOD GLUCOSE SYSTEM) DEVI Use once daily as directed to check blood glucose    . carvedilol (COREG) 6.25 MG tablet Take 1 tablet (6.25 mg total) by mouth 2 (two) times daily with a meal. 180 tablet 3  . cholecalciferol (VITAMIN D) 1000 UNITS tablet Take 1 tablet (1,000 Units total) by mouth daily. 90 tablet 3  . cyanocobalamin 500 MCG tablet Take 1 tablet (500 mcg total) by mouth daily. 90 tablet 3  . EASY TALK BLOOD GLUCOSE TEST test strip Use to check blood glucose once daily as directed    . Insulin Glargine (LANTUS) 100 UNIT/ML Solostar Pen Inject 30 Units into the skin at bedtime. 60 mL 3  . Insulin Pen Needle (RELION PEN NEEDLE 31G/8MM) 31G X 8 MM MISC 1 Device by Does not apply route 4 (four) times daily. 120 each 11  . letrozole (FEMARA) 2.5 MG tablet TAKE ONE TABLET BY MOUTH DAILY. 90 tablet 12  . Liraglutide (VICTOZA) 18 MG/3ML SOPN Inject 3 mLs (18 mg total) into the skin every morning. 6 mL   . losartan (COZAAR) 100 MG tablet Take 1 tablet (100 mg total) by mouth daily. 90 tablet 3  . Multiple Vitamins-Minerals (ONE-A-DAY WOMENS 50+ ADVANTAGE) TABS Take 1 tablet by mouth daily. 90 tablet 3  . omeprazole (PRILOSEC) 40 MG capsule Take 40 mg by mouth daily.    . sertraline (ZOLOFT) 50 MG tablet Take 1 tablet (50 mg total) by mouth daily. 90 tablet 3   . spironolactone (ALDACTONE) 25 MG tablet Take 1 tablet (25 mg total) by mouth daily. 30 tablet 11  . albuterol (PROVENTIL) (2.5 MG/3ML) 0.083% nebulizer solution Take 2.5 mg by nebulization every 6 (six) hours as needed for shortness of breath.     . ALPRAZolam (XANAX) 0.5 MG tablet TAKE ONE-HALF TO ONE TABLET BY MOUTH THREE TIMES DAILY AS NEEDED FOR  NERVES 270 tablet 1  . metFORMIN (GLUCOPHAGE) 500 MG tablet Take 500 mg by mouth daily as needed. Patient states she is taking as needed only if her blood sugar is high      Results for orders placed or performed during the hospital encounter of 08/09/15 (from the past 48 hour(s))  Basic metabolic panel     Status: Abnormal   Collection Time: 08/08/15 12:15 PM  Result Value Ref Range   Sodium 141 135 - 145 mmol/L   Potassium 5.3 (H) 3.5 - 5.1 mmol/L   Chloride 103 101 - 111 mmol/L   CO2 31 22 - 32 mmol/L   Glucose, Bld 114 (H) 65 - 99 mg/dL   BUN 13 6 - 20 mg/dL   Creatinine, Ser 1.10 (H) 0.44 - 1.00 mg/dL   Calcium 10.2 8.9 - 10.3 mg/dL   GFR calc non Af Amer 48 (L) >60 mL/min   GFR calc Af Amer 55 (L) >60 mL/min    Comment: (NOTE) The eGFR has been calculated using the CKD EPI equation. This calculation has not been validated in all clinical situations. eGFR's persistently <60 mL/min signify possible Chronic Kidney Disease.    Anion gap 7 5 - 15  CBC WITH DIFFERENTIAL     Status: None   Collection Time: 08/08/15 12:15 PM  Result Value Ref Range   WBC 6.6 4.0 - 10.5 K/uL   RBC 4.78 3.87 - 5.11 MIL/uL   Hemoglobin 14.0 12.0 - 15.0 g/dL   HCT 43.5 36.0 - 46.0 %   MCV 91.0 78.0 - 100.0 fL   MCH 29.3 26.0 - 34.0 pg   MCHC 32.2 30.0 - 36.0 g/dL   RDW 13.7 11.5 - 15.5 %   Platelets 215 150 - 400 K/uL   Neutrophils Relative % 55 %   Neutro Abs 3.7 1.7 - 7.7 K/uL   Lymphocytes Relative 32 %   Lymphs Abs 2.1 0.7 - 4.0 K/uL   Monocytes Relative 9 %   Monocytes Absolute 0.6 0.1 - 1.0 K/uL   Eosinophils Relative 3 %   Eosinophils  Absolute 0.2 0.0 - 0.7 K/uL   Basophils Relative 1 %   Basophils Absolute 0.0 0.0 - 0.1 K/uL   Mm Rt Radioactive Seed Loc Mammo Guide  08/08/2015  CLINICAL DATA:  75 year old female for seed localization of biopsy site demonstrating atypical ductal hyperplasia EXAM: MAMMOGRAPHIC GUIDED RADIOACTIVE SEED LOCALIZATION OF THE RIGHT BREAST COMPARISON:  Previous exam(s). FINDINGS: Patient presents for radioactive seed localization prior to right excisional biopsy. I met with the patient and we discussed the procedure of seed localization including benefits and alternatives. We discussed the high likelihood of a successful procedure. We discussed the risks of the procedure including infection, bleeding, tissue injury and further surgery. We discussed the low dose of radioactivity involved in the procedure. Informed, written consent was given. The usual time-out protocol was performed immediately prior to the procedure. Using mammographic guidance, sterile technique, 1% lidocaine and an I-125 radioactive seed, the ribbon shaped biopsy marker was localized using a superior to inferior approach. The follow-up mammogram images confirm the seed in the expected location and were marked for Dr. Donne Hazel. Follow-up survey of the patient confirms presence of the radioactive seed. Order number of I-125 seed:  [id number]. Total activity:  0.250 mCi  reference Date: July 25, 2015 The patient tolerated the procedure well and was released from the [facility]. She was given instructions regarding seed removal. IMPRESSION: Radioactive seed localization of the right  breast. No apparent complications. Electronically Signed   By: Pamelia Hoit M.D.   On: 08/08/2015 15:44    ROS Negative  Blood pressure 136/63, pulse 65, temperature 98 F (36.7 C), temperature source Oral, resp. rate 18, height '5\' 8"'$  (1.727 m), weight 85.73 kg (189 lb), SpO2 97 %. Physical Exam   Vitals (Sonya Bynum CMA; 07/22/2015 1:39 PM) 07/22/2015 1:38  PM Weight: 191 lb Height: 68in Body Surface Area: 2 m Body Mass Index: 29.04 kg/m  Temp.: 98.81F(Temporal)  Pulse: 79 (Regular)  BP: 126/70 (Sitting, Left Arm, Standard) Physical Exam Rolm Bookbinder MD; 07/22/2015 6:45 PM) General Mental Status-Alert. Orientation-Oriented X3. Chest and Lung Exam Chest and lung exam reveals -on auscultation, normal breath sounds, no adventitious sounds and normal vocal resonance. Breast Breast Lump-No Palpable Breast Mass. Note: surgically absent left nipple, right nipple with 5 mm whitish pustule, no real nipple dc Cardiovascular Cardiovascular examination reveals -normal heart sounds, regular rate and rhythm with no murmurs. Lymphatic Head & Neck General Head & Neck Lymphatics: Bilateral - Description - Normal. Axillary General Axillary Region: Bilateral - Description - Normal. Note: no [place] adenopathy  Assessment/Plan ATYPICAL DUCTAL HYPERPLASIA OF RIGHT BREAST (N60.91) Story: Right breast seed guided excisional biopsy, excision of nipple mass I recommended above. she is agreeable. discussed surgery,seed and risks involved.  Rolm Bookbinder, MD 08/09/2015, 7:02 AM

## 2015-08-09 NOTE — Progress Notes (Signed)
Per Dr. Linna Caprice discontinue ISTAT today. No need to repeat K+. DOS

## 2015-08-09 NOTE — Interval H&P Note (Signed)
History and Physical Interval Note:  08/09/2015 7:04 AM  [name]  has presented today for surgery, with the diagnosis of right breast and nipple mass  The various methods of treatment have been discussed with the patient and family. After consideration of risks, benefits and other options for treatment, the patient has consented to  Procedure(s): RIGHT RADIOACTIVE SEED GUIDED EXCISIONAL BREAST BIOPSY, RIGHT NIPPLE EXCISION (Right) as a surgical intervention .  The patient's history has been reviewed, patient examined, no change in status, stable for surgery.  I have reviewed the patient's chart and labs.  Questions were answered to the patient's satisfaction.     Gidget Quizhpi

## 2015-08-09 NOTE — Transfer of Care (Signed)
Immediate Anesthesia Transfer of Care Note  Patient: [name]  Procedure(s) Performed: Procedure(s): RIGHT RADIOACTIVE SEED GUIDED EXCISIONAL BREAST BIOPSY, RIGHT NIPPLE EXCISION (Right)  Patient Location: PACU  Anesthesia Type:General  Level of Consciousness: awake, alert , oriented and patient cooperative  Airway & Oxygen Therapy: Patient Spontanous Breathing and Patient connected to face mask oxygen  Post-op Assessment: Report given to RN and Post -op Vital signs reviewed and stable  Post vital signs: Reviewed and stable  Last Vitals:  Filed Vitals:   08/09/15 0638  BP: 136/63  Pulse: 65  Temp: 36.7 C  Resp: 18    Last Pain: There were no vitals filed for this visit.       Complications: No apparent anesthesia complications

## 2015-08-15 ENCOUNTER — Encounter (HOSPITAL_BASED_OUTPATIENT_CLINIC_OR_DEPARTMENT_OTHER): Payer: Self-pay | Admitting: General Surgery

## 2015-09-14 ENCOUNTER — Emergency Department (HOSPITAL_COMMUNITY)
Admission: EM | Admit: 2015-09-14 | Discharge: 2015-09-15 | Disposition: A | Discharge status: Home / Self Care | Payer: Medicare HMO | Attending: Dermatology | Admitting: Dermatology

## 2015-09-14 ENCOUNTER — Encounter (HOSPITAL_COMMUNITY): Payer: Self-pay | Admitting: Emergency Medicine

## 2015-09-14 ENCOUNTER — Emergency Department (HOSPITAL_COMMUNITY): Payer: Medicare HMO

## 2015-09-14 DIAGNOSIS — J45909 Unspecified asthma, uncomplicated: Secondary | ICD-10-CM | POA: Diagnosis not present

## 2015-09-14 DIAGNOSIS — Y999 Unspecified external cause status: Secondary | ICD-10-CM | POA: Insufficient documentation

## 2015-09-14 DIAGNOSIS — Z7984 Long term (current) use of oral hypoglycemic drugs: Secondary | ICD-10-CM | POA: Insufficient documentation

## 2015-09-14 DIAGNOSIS — I110 Hypertensive heart disease with heart failure: Secondary | ICD-10-CM | POA: Insufficient documentation

## 2015-09-14 DIAGNOSIS — Z794 Long term (current) use of insulin: Secondary | ICD-10-CM | POA: Diagnosis not present

## 2015-09-14 DIAGNOSIS — Z853 Personal history of malignant neoplasm of breast: Secondary | ICD-10-CM | POA: Insufficient documentation

## 2015-09-14 DIAGNOSIS — Y929 Unspecified place or not applicable: Secondary | ICD-10-CM | POA: Insufficient documentation

## 2015-09-14 DIAGNOSIS — I5042 Chronic combined systolic (congestive) and diastolic (congestive) heart failure: Secondary | ICD-10-CM | POA: Diagnosis not present

## 2015-09-14 DIAGNOSIS — S42211A Unspecified displaced fracture of surgical neck of right humerus, initial encounter for closed fracture: Secondary | ICD-10-CM | POA: Diagnosis not present

## 2015-09-14 DIAGNOSIS — S4991XA Unspecified injury of right shoulder and upper arm, initial encounter: Secondary | ICD-10-CM | POA: Diagnosis present

## 2015-09-14 DIAGNOSIS — Z7982 Long term (current) use of aspirin: Secondary | ICD-10-CM | POA: Diagnosis not present

## 2015-09-14 DIAGNOSIS — Y9389 Activity, other specified: Secondary | ICD-10-CM | POA: Diagnosis not present

## 2015-09-14 DIAGNOSIS — E119 Type 2 diabetes mellitus without complications: Secondary | ICD-10-CM | POA: Insufficient documentation

## 2015-09-14 DIAGNOSIS — W1839XA Other fall on same level, initial encounter: Secondary | ICD-10-CM | POA: Insufficient documentation

## 2015-09-14 DIAGNOSIS — S42201A Unspecified fracture of upper end of right humerus, initial encounter for closed fracture: Secondary | ICD-10-CM

## 2015-09-14 MED ORDER — FENTANYL CITRATE (PF) 100 MCG/2ML IJ SOLN
50.0000 ug | INTRAMUSCULAR | Status: DC | PRN
  Administered 2015-09-14: 50 ug via INTRAVENOUS
  Filled 2015-09-14: qty 2

## 2015-09-14 NOTE — ED Notes (Signed)
Patient transported to X-ray 

## 2015-09-14 NOTE — ED Triage Notes (Signed)
Pt arrived to ED via EMS. While attempted to get out of car, pt lost balance and landed on right side. Ride arm swelling present with deformity. Initially states she also fell on right hip. Arrived in arm restraint. 100 mcg of Fentanyl provided for pain relief, provided relief. Pain increased at arrived. EMS provided another 50 mcg of Fentanyl. Pt states she has been issues with unsteady gait since 2016. HX of DM, Breast cancer with left side mastectomy.

## 2015-09-14 NOTE — ED Notes (Signed)
PA at bedside.

## 2015-09-14 NOTE — ED Provider Notes (Signed)
[facility] DEPT Provider Note   CSN: [id number] Arrival date & time: 09/14/15  2206  First Provider Contact:  None       History   Chief Complaint Chief Complaint  Patient presents with  . Fall    HPI [name] is a 75 y.o. female.  Patient with history of breast cancer, congestive heart failure, type 2 diabetes -- presents after a fall occurring just prior to arrival. Patient was getting out of a car when she fell onto her right side impacting a landscaping block. EMS was called for transport, patient received fentanyl en route. Pain is currently tolerable per the patient. She denies hitting her head or neck. Family on scene states that she was not knocked out. Patient denies any weakness, numbness in her extremities. Mainly complains of pain in her right shoulder and right upper arm. Denies back pain, hip pain, pain in her extremities. The onset of this condition was acute. The course is constant. Aggravating factors: movement. Alleviating factors: none.         Past Medical History:  Diagnosis Date  . ALLERGIC RHINITIS 12/13/2007  . ANXIETY 06/04/2008  . Asthma   . ASTHMATIC BRONCHITIS, ACUTE 06/04/2008  . Blood transfusion 1964   post childbirth  . 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 Chronic combined systolic (congestive) and diastolic (congestive) heart failure ([place]) 05/29/2015  . DEGENERATIVE JOINT DISEASE 12/17/2006  . DEPRESSION 08/10/2007  . Dilated cardiomyopathy ([place]) 05/29/2015   EF 35-40% - felt secondary to XRT and chemo for breast CA  . DM type 2 (diabetes mellitus, type 2) ([place]) 08/10/2007  . GERD 12/17/2006  . History of chemotherapy   . History of radiation therapy 10/07/11-11/24/11   left breast,5040 cGy/28 sessions,l chest wall boost=1000cGy/5 sesions  . 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 HYPERCHOLESTEROLEMIA 12/17/2006  . HYPERTENSION 12/17/2006  . OBESITY 06/04/2008  . Osteopenia due to cancer therapy 09/12/2013      Patient Active Problem List   Diagnosis Date Noted  . Chronic systolic CHF (congestive heart failure) ([place]) 07/17/2015  . Dilated cardiomyopathy ([place]) 05/29/2015  . Recurrent major depressive disorder ([place]) 02/28/2015  . Acute bronchitis 12/19/2014  . Acid reflux 06/20/2014  . Herpes 06/20/2014  . 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 H/O malignant neoplasm of breast 03/15/2013  . HLD (hyperlipidemia) 03/15/2013  . Long term current use of insulin ([place]) 03/15/2013  . 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 Controlled type 2 diabetes mellitus without complication ([place]) 123456  . Mixed incontinence 03/15/2013  . Macular degeneration 03/02/2013  . LBP (low back pain) 02/23/2013  . Open wound of left breast 05/22/2011  . Primary cancer of lower-inner quadrant of left female breast ([place]) 02/20/2011  . DM type 2 (diabetes mellitus, type 2) ([place]) 08/10/2007  . DEPRESSION 08/10/2007  . HYPERCHOLESTEROLEMIA 12/17/2006  . Essential hypertension 12/17/2006  . GERD 12/17/2006    Past Surgical History:  Procedure Laterality Date  . bladder tack  1974  . BREAST EXCISIONAL BIOPSY  11/10/1988   left benign Dr Margot Chimes  . BREAST EXCISIONAL BIOPSY  03/04/1983   Right - two areas - Dr Margot Chimes  . BREAST EXCISIONAL BIOPSY  10/09/1980   right - Dr Margot Chimes  . BREAST EXCISIONAL BIOPSY  10/18/1979   left - Dr Margot Chimes  . BREAST EXCISIONAL BIOPSY  01/02/1994   right - Dr Margot Chimes  . BREAST SURGERY    .  CARDIAC CATHETERIZATION N/A 05/29/2015   Procedure: Left Heart Cath and Coronary Angiography;  Surgeon: Belva Crome, MD;  Location: [facility];  Service: Cardiovascular;  Laterality: N/A;  . CHOLECYSTECTOMY  02/23/1991   LC - Dr Margot Chimes  . COLONOSCOPY    . ENTEROCELE REPAIR  06/08   Dr. Cletis Media  . EVACUATION BREAST HEMATOMA  07/01/2011   Procedure: EVACUATION HEMATOMA BREAST;  Surgeon: Haywood Lasso, MD;  Location: [facility];  Service: General;  Laterality: Left;  Drainage  of left mastectomy seroma  . HIP SURGERY  08/27/2010   DR. Maureen Ralphs  . LAMINECTOMY  1993   s/p-Dr. Rita Ohara  . MASTECTOMY Left 03/23/2011  . MODIFIED RADICAL MASTECTOMY W/ AXILLARY LYMPH NODE DISSECTION Left 03-30-11  . PORT-A-CATH REMOVAL  12/02/2011   Procedure: REMOVAL PORT-A-CATH;  Surgeon: Haywood Lasso, MD;  Location: [facility];  Service: General;  Laterality: N/A;  . PORTACATH PLACEMENT  04/13/2011   Procedure: INSERTION PORT-A-CATH;  Surgeon: Haywood Lasso, MD;  Location: [facility];  Service: General;  Laterality: N/A;  porta cath placement,removal of J-P drain  . RADIOACTIVE SEED GUIDED EXCISIONAL BREAST BIOPSY Right 08/09/2015   Procedure: RIGHT RADIOACTIVE SEED GUIDED EXCISIONAL BREAST BIOPSY, RIGHT NIPPLE LESION EXCISION;  Surgeon: Rolm Bookbinder, MD;  Location: [facility];  Service: General;  Laterality: Right;  . ROTATOR CUFF REPAIR  8/03   right, Dr. Tonita Cong  . ROTATOR CUFF REPAIR  10/26/2008   left, Dr. Rush Farmer  . TONSILLECTOMY    . VAGINAL HYSTERECTOMY  1974   partial     OB History    Gravida Para Term Preterm AB Living   5 3       3    SAB TAB Ectopic Multiple Live Births                   Home Medications    Prior to Admission medications   Medication Sig Start Date End Date Taking? Authorizing Provider  acyclovir (ZOVIRAX) 200 MG capsule TAKE 2 CAPSULES BY MOUTH 3 TIMES DAILY 10/23/13   Noralee Space, MD  albuterol (PROVENTIL) (2.5 MG/3ML) 0.083% nebulizer solution Take 2.5 mg by nebulization every 6 (six) hours as needed for shortness of breath.  02/05/14   Historical Provider, MD  ALPRAZolam Duanne Moron) 0.5 MG tablet TAKE ONE-HALF TO ONE TABLET BY MOUTH THREE TIMES DAILY AS NEEDED FOR  NERVES 02/23/13   Noralee Space, MD  amLODipine (NORVASC) 5 MG tablet Take 1 tablet (5 mg total) by mouth daily. 07/17/15   Sueanne Margarita, MD  aspirin EC 325 MG tablet Take 325 mg by mouth daily.    Historical Provider, MD    atorvastatin (LIPITOR) 40 MG tablet Take 40 mg by mouth daily.    Historical Provider, MD  Blood Glucose Monitoring Suppl (EASY TALK BLOOD GLUCOSE SYSTEM) DEVI Use once daily as directed to check blood glucose 04/13/12   Historical Provider, MD  carvedilol (COREG) 6.25 MG tablet Take 1 tablet (6.25 mg total) by mouth 2 (two) times daily with a meal. 07/17/15   Sueanne Margarita, MD  cholecalciferol (VITAMIN D) 1000 UNITS tablet Take 1 tablet (1,000 Units total) by mouth daily. 02/23/13   Noralee Space, MD  cyanocobalamin 500 MCG tablet Take 1 tablet (500 mcg total) by mouth daily. 02/23/13   Noralee Space, MD  EASY TALK BLOOD GLUCOSE TEST test strip Use to check blood glucose once daily as directed 03/26/12  Historical Provider, MD  Insulin Glargine (LANTUS) 100 UNIT/ML Solostar Pen Inject 30 Units into the skin at bedtime. 05/30/15   Marja Kays, MD  Insulin Pen Needle (RELION PEN NEEDLE 31G/8MM) 31G X 8 MM MISC 1 Device by Does not apply route 4 (four) times daily. 10/05/12   Renato Shin, MD  letrozole (FEMARA) 2.5 MG tablet TAKE ONE TABLET BY MOUTH DAILY. 08/25/12   Consuela Mimes, MD  Liraglutide (VICTOZA) 18 MG/3ML SOPN Inject 3 mLs (18 mg total) into the skin every morning. 04/18/15   Nicholas Lose, MD  losartan (COZAAR) 100 MG tablet Take 1 tablet (100 mg total) by mouth daily. 02/23/13   Noralee Space, MD  metFORMIN (GLUCOPHAGE) 500 MG tablet Take 500 mg by mouth daily as needed. Patient states she is taking as needed only if her blood sugar is high    Historical Provider, MD  Multiple Vitamins-Minerals (ONE-A-DAY WOMENS 50+ ADVANTAGE) TABS Take 1 tablet by mouth daily. 02/23/13   Noralee Space, MD  omeprazole (PRILOSEC) 40 MG capsule Take 40 mg by mouth daily.    Historical Provider, MD  sertraline (ZOLOFT) 50 MG tablet Take 1 tablet (50 mg total) by mouth daily. 02/23/13   Noralee Space, MD  spironolactone (ALDACTONE) 25 MG tablet Take 1 tablet (25 mg total) by mouth daily. 07/17/15   Sueanne Margarita,  MD    Family History Family History  Problem Relation Age of Onset  . COPD Mother   . Arthritis Mother   . Emphysema Mother   . Diabetes Brother   . Cancer Brother     prostate  . Cancer Father     malignant brain tumor  . Mental illness Paternal Grandfather   . Heart disease Other     Sibling  . Diabetes Other     Sibling     Social History Social History  Substance Use Topics  . Smoking status: Never Smoker  . Smokeless tobacco: Never Used  . Alcohol use No     Allergies   Oxycodone-acetaminophen; Cephalexin; Clindamycin; Dilaudid [hydromorphone hcl]; Oxycodone-acetaminophen; Rocephin [ceftriaxone sodium in dextrose]; and Sulfonamide derivatives   Review of Systems Review of Systems  Constitutional: Negative for fever.  HENT: Negative for rhinorrhea and sore throat.   Eyes: Negative for redness.  Respiratory: Negative for cough.   Cardiovascular: Negative for chest pain.  Gastrointestinal: Negative for abdominal pain, diarrhea, nausea and vomiting.  Genitourinary: Negative for dysuria.  Musculoskeletal: Positive for arthralgias and myalgias. Negative for back pain, gait problem and joint swelling.  Skin: Negative for rash.  Neurological: Negative for headaches.     Physical Exam Updated Vital Signs BP 114/60 (BP Location: Right Leg)   Pulse 71   Temp 97.8 F (36.6 C) (Oral)   Resp 20   SpO2 94%   Physical Exam  Constitutional: She is oriented to person, place, and time. She appears well-developed and well-nourished.  HENT:  Head: Normocephalic and atraumatic. Head is without raccoon's eyes and without Battle's sign.  Right Ear: Tympanic membrane, external ear and ear canal normal. No hemotympanum.  Left Ear: Tympanic membrane, external ear and ear canal normal. No hemotympanum.  Nose: Nose normal. No nasal septal hematoma.  Mouth/Throat: Uvula is midline, oropharynx is clear and moist and mucous membranes are normal.  Eyes: Conjunctivae, EOM and  lids are normal. Pupils are equal, round, and reactive to light. Right eye exhibits no nystagmus. Left eye exhibits no nystagmus.  No visible hyphema noted  Neck:  Normal range of motion. Neck supple.  Cardiovascular: Normal rate and regular rhythm.   Pulmonary/Chest: Effort normal and breath sounds normal.  Abdominal: Soft. There is no tenderness.  Musculoskeletal:       Right shoulder: She exhibits decreased range of motion, tenderness and bony tenderness.       Left shoulder: Normal.       Right elbow: Normal.      Left elbow: Normal.       Right wrist: Normal.       Left wrist: Normal.       Right hip: Normal.       Left hip: Normal.       Right knee: Normal.       Left knee: Normal.       Right ankle: Normal.       Left ankle: Normal.       Cervical back: She exhibits normal range of motion, no tenderness and no bony tenderness.       Thoracic back: She exhibits no tenderness and no bony tenderness.       Lumbar back: She exhibits no tenderness and no bony tenderness.       Right upper arm: She exhibits tenderness, bony tenderness and swelling. She exhibits no deformity.       Right forearm: Normal.       Right hand: Normal.  Neurological: She is alert and oriented to person, place, and time. She has normal strength and normal reflexes. No cranial nerve deficit or sensory deficit. Coordination normal. GCS eye subscore is 4. GCS verbal subscore is 5. GCS motor subscore is 6.  Skin: Skin is warm and dry.  Psychiatric: She has a normal mood and affect.  Nursing note and vitals reviewed.    ED Treatments / Results  Labs (all labs ordered are listed, but only abnormal results are displayed) Labs Reviewed - No data to display  EKG  EKG Interpretation None       Radiology Dg Shoulder Right  Result Date: 09/14/2015 CLINICAL DATA:  Status post fall on right side tonight, with right shoulder pain. Initial encounter. EXAM: RIGHT SHOULDER - 2+ VIEW COMPARISON:  None. FINDINGS:  There is a mildly displaced and mildly comminuted fracture through the surgical neck of the right humerus, with mild shortening at the fracture site and mild rotation of the humeral head. The right acromioclavicular joint is grossly unremarkable. Overlying soft tissue swelling is noted. The visualized portions of the right lung appear grossly clear. IMPRESSION: Mildly displaced and mildly comminuted fracture through the surgical neck of the right humerus, with mild shortening at the fracture site and mild rotation of the humeral head. Electronically Signed   By: Garald Balding M.D.   On: 09/14/2015 23:44   Dg Humerus Right  Result Date: 09/14/2015 CLINICAL DATA:  Status post fall on right side, with right shoulder pain. Initial encounter. EXAM: RIGHT HUMERUS - 2+ VIEW COMPARISON:  None. FINDINGS: There is a mildly displaced fracture through the surgical neck of the right humerus, with mild shortening at the fracture site and mild rotation of the humeral head. The right humeral head remains seated at the glenoid fossa. The right acromioclavicular joint is grossly unremarkable. The distal right humerus appears intact. The elbow joint is grossly unremarkable, though not fully assessed. The visualized portions of the right lung are grossly clear. Mild soft tissue swelling is noted overlying the right shoulder. IMPRESSION: Mildly displaced fracture through the surgical neck of the right humerus,  with mild shortening at the fracture site and mild rotation of the humeral head. Electronically Signed   By: Garald Balding M.D.   On: 09/14/2015 23:46    Procedures Procedures (including critical care time)  Medications Ordered in ED Medications  fentaNYL (SUBLIMAZE) injection 50 mcg (50 mcg Intravenous Given 09/14/15 2312)  HYDROcodone-acetaminophen (NORCO/VICODIN) 5-325 MG per tablet 1 tablet (1 tablet Oral Given 09/15/15 0025)  fentaNYL (SUBLIMAZE) injection 100 mcg (100 mcg Intravenous Given 09/15/15 0130)      Initial Impression / Assessment and Plan / ED Course  I have reviewed the triage vital signs and the nursing notes.  Pertinent labs & imaging results that were available during my care of the patient were reviewed by me and considered in my medical decision making (see chart for details).  Clinical Course  Comment By Time  Patient seen and examined. EKG ordered. Injury seems to be isolated to R UE. No head or neck pain.  Carlisle Cater, PA-C 08/12 2254  Pain is not controlled to an acceptable level. Additional fentanyl ordered.  Carlisle Cater, PA-C 08/13 0119   Patient feels better prior to discharge. She will follow-up with orthopedic referral next week.  Patient counseled on use of narcotic pain medications. Counseled not to combine these medications with others containing tylenol. Urged not to drink alcohol, drive, or perform any other activities that requires focus while taking these medications. The patient verbalizes understanding and agrees with the plan.  Use pain medication only under direct supervision at the lowest possible dose needed to control your pain.    Final Clinical Impressions(s) / ED Diagnoses   Final diagnoses:  Proximal humerus fracture, right, closed, initial encounter   Patient with mechanical fall resulting in approximately humerus fracture on the right side. Upper extremity is neurovascularly intact. Patient treated with sling and pain medication. Pain acceptably controlled prior to discharge.   New Prescriptions New Prescriptions   HYDROCODONE-ACETAMINOPHEN (NORCO/VICODIN) 5-325 MG TABLET    Take 1-2 tablets every 6 hours as needed for severe pain     Carlisle Cater, PA-C 09/15/15 [place], MD 09/15/15 1302

## 2015-09-14 NOTE — ED Notes (Signed)
MD at bedside. 

## 2015-09-15 MED ORDER — FENTANYL CITRATE (PF) 100 MCG/2ML IJ SOLN
100.0000 ug | Freq: Once | INTRAMUSCULAR | Status: AC
  Administered 2015-09-15: 100 ug via INTRAVENOUS
  Filled 2015-09-15: qty 2

## 2015-09-15 MED ORDER — HYDROCODONE-ACETAMINOPHEN 5-325 MG PO TABS: ORAL_TABLET | ORAL | 0 refills | Status: DC

## 2015-09-15 MED ORDER — HYDROCODONE-ACETAMINOPHEN 5-325 MG PO TABS
1.0000 | ORAL_TABLET | Freq: Once | ORAL | Status: AC
  Administered 2015-09-15: 1 via ORAL
  Filled 2015-09-15: qty 1

## 2015-09-15 NOTE — Discharge Instructions (Signed)
Please read and follow all provided instructions.  Your diagnoses today include:  1. Proximal humerus fracture, right, closed, initial encounter     Tests performed today include:  An x-ray of the affected area - shows broken upper arm bone  Vital signs. See below for your results today.   Medications prescribed:   Vicodin (hydrocodone/acetaminophen) - narcotic pain medication  DO NOT drive or perform any activities that require you to be awake and alert because this medicine can make you drowsy. BE VERY CAREFUL not to take multiple medicines containing Tylenol (also called acetaminophen). Doing so can lead to an overdose which can damage your liver and cause liver failure and possibly death.  Take any prescribed medications only as directed.  Home care instructions:   Follow any educational materials contained in this packet  Follow R.I.C.E. Protocol:  R - rest your injury   I  - use ice on injury without applying directly to skin  C - compress injury with bandage or splint  E - elevate the injury as much as possible  Follow-up instructions: Please follow-up with the provided orthopedic physician (bone specialist) in 1 week.   Return instructions:   Please return if your fingers are numb or tingling, appear gray or blue, or you have severe pain (also elevate the arm and loosen splint or wrap if you were given one)  Please return to the Emergency Department if you experience worsening symptoms.   Please return if you have any other emergent concerns.  Additional Information:  Your vital signs today were: BP 125/64    Pulse 62    Temp 97.8 F (36.6 C) (Oral)    Resp 14    SpO2 94%  If your blood pressure (BP) was elevated above 135/85 this visit, please have this repeated by your doctor within one month. --------------

## 2015-09-15 NOTE — Progress Notes (Signed)
Orthopedic Tech Progress Note Patient Details:  [name] 06/03/1940 [id number]  Ortho Devices Type of Ortho Device: Arm sling Ortho Device/Splint Location: rue Ortho Device/Splint Interventions: Ordered, Application   Karolee Stamps 09/15/2015, 1:25 AM

## 2015-10-25 ENCOUNTER — Telehealth: Payer: Self-pay | Admitting: Cardiology

## 2015-10-25 NOTE — Telephone Encounter (Signed)
New message    Pt calling to reschedule appt on 9/27 b/c she broke her arm and is now in physical therapy. She would like to be worked in. I offered her 11/10 with the doc but she declined and is not interested in seeing a PA. Please call.

## 2015-10-28 NOTE — Telephone Encounter (Signed)
Returned call.  States she broke her arm and is doing physical therapy and needs appointment on Mon,Wed or Fri.  Cancelled her app w/Dr. Radford Pax.  She is agreeable to see a PA.  Scheduled her to see Ann Maki on 10/3 with lab prior to appointment.

## 2015-10-30 ENCOUNTER — Ambulatory Visit: Payer: Medicare HMO | Admitting: Cardiology

## 2015-11-01 ENCOUNTER — Encounter: Payer: Self-pay | Admitting: Cardiology

## 2015-11-04 ENCOUNTER — Ambulatory Visit (INDEPENDENT_AMBULATORY_CARE_PROVIDER_SITE_OTHER): Payer: Medicare HMO | Admitting: Cardiology

## 2015-11-04 ENCOUNTER — Other Ambulatory Visit: Payer: Medicare HMO | Admitting: *Deleted

## 2015-11-04 ENCOUNTER — Ambulatory Visit (INDEPENDENT_AMBULATORY_CARE_PROVIDER_SITE_OTHER): Payer: Medicare HMO | Admitting: Orthopaedic Surgery

## 2015-11-04 ENCOUNTER — Encounter: Payer: Self-pay | Admitting: Cardiology

## 2015-11-04 VITALS — BP 122/80 | HR 86 | Ht 68.0 in | Wt 183.0 lb

## 2015-11-04 DIAGNOSIS — I5022 Chronic systolic (congestive) heart failure: Secondary | ICD-10-CM

## 2015-11-04 DIAGNOSIS — I420 Dilated cardiomyopathy: Secondary | ICD-10-CM

## 2015-11-04 DIAGNOSIS — S42351D Displaced comminuted fracture of shaft of humerus, right arm, subsequent encounter for fracture with routine healing: Secondary | ICD-10-CM | POA: Diagnosis not present

## 2015-11-04 DIAGNOSIS — I10 Essential (primary) hypertension: Secondary | ICD-10-CM

## 2015-11-04 LAB — BASIC METABOLIC PANEL
BUN: 17 mg/dL (ref 7–25)
CALCIUM: 10 mg/dL (ref 8.6–10.4)
CO2: 29 mmol/L (ref 20–31)
Chloride: 104 mmol/L (ref 98–110)
Creat: 0.73 mg/dL (ref 0.60–0.93)
Glucose, Bld: 153 mg/dL — ABNORMAL HIGH (ref 65–99)
POTASSIUM: 4.1 mmol/L (ref 3.5–5.3)
SODIUM: 142 mmol/L (ref 135–146)

## 2015-11-04 NOTE — Patient Instructions (Addendum)
Medication Instructions:   Your physician recommends that you continue on your current medications as directed. Please refer to the Current Medication list given to you today.   If you need a refill on your cardiac medications before your next appointment, please call your pharmacy.  Labwork: NONE ORDER TODAY    Testing/Procedures: NONE ORDER TODAY'   Follow-Up: Your physician wants you to follow-up in:  IN  6  MONTHS WITH DR [name]  You will receive a reminder letter in the mail two months in advance. If you don't receive a letter, please call our office to schedule the follow-up appointment.      Any Other Special Instructions Will Be Listed Below (If Applicable).  CONTACT OFFICE IF GAINED 3LBS IN 24 HOURS AND 5 LBS IN A WEEK                                                                                                                               Heart Failure Heart failure is a condition in which the heart has trouble pumping blood. This means your heart does not pump blood efficiently for your body to work well. In some cases of heart failure, fluid may back up into your lungs or you may have swelling (edema) in your lower legs. Heart failure is usually a long-term (chronic) condition. It is important for you to take good care of yourself and follow your health care provider's treatment plan.   CAUSES  Some health conditions can cause heart failure. Those health conditions include:  High blood pressure (hypertension). Hypertension causes the heart muscle to work harder than normal. When pressure in the blood vessels is high, the heart needs to pump (contract) with more force in order to circulate blood throughout the body. High blood pressure eventually causes the heart to become stiff and weak.  Coronary artery disease (CAD). CAD is the buildup of cholesterol and fat (plaque) in the arteries of the heart. The blockage in the arteries deprives the heart muscle of oxygen and  blood. This can cause chest pain and may lead to a heart attack. High blood pressure can also contribute to CAD.  Heart attack (myocardial infarction). A heart attack occurs when one or more arteries in the heart become blocked. The loss of oxygen damages the muscle tissue of the heart. When this happens, part of the heart muscle dies. The injured tissue does not contract as well and weakens the heart's ability to pump blood.  Abnormal heart valves. When the heart valves do not open and close properly, it can cause heart failure. This makes the heart muscle pump harder to keep the blood flowing.  Heart muscle disease (cardiomyopathy or myocarditis). Heart muscle disease is damage to the heart muscle from a variety of causes. These can include drug or alcohol abuse, infections, or unknown reasons. These can increase the risk of heart failure.  Lung disease. Lung disease makes the heart work harder because  the lungs do not work properly. This can cause a strain on the heart, leading it to fail.  Diabetes. Diabetes increases the risk of heart failure. High blood sugar contributes to high fat (lipid) levels in the blood. Diabetes can also cause slow damage to tiny blood vessels that carry important nutrients to the heart muscle. When the heart does not get enough oxygen and food, it can cause the heart to become weak and stiff. This leads to a heart that does not contract efficiently.  Other conditions can contribute to heart failure. These include abnormal heart rhythms, thyroid problems, and low blood counts (anemia)  Certain unhealthy behaviors can increase the risk of heart failure, including:  Being overweight.  Smoking or chewing tobacco.  Eating foods high in fat and cholesterol.  Abusing illicit drugs or alcohol.  Lacking physical activity.  HOME CARE INSTRUCTIONS   Take medicines only as directed by your health care provider. Medicines are important in reducing the workload of your  heart, slowing the progression of heart failure, and improving your symptoms.  Do not stop taking your medicine unless directed by your health care provider.  Do not skip any dose of medicine.  Refill your prescriptions before you run out of medicine. Your medicines are needed every day.  Engage in moderate physical activity if directed by your health care provider. Moderate physical activity can benefit some people. The elderly and people with severe heart failure should consult with a health care provider for physical activity recommendations.  Eat heart-healthy foods. Food choices should be free of trans fat and low in saturated fat, cholesterol, and salt (sodium). Healthy choices include fresh or frozen fruits and vegetables, fish, lean meats, legumes, fat-free or low-fat dairy products, and whole grain or high fiber foods. Talk to a dietitian to learn more about heart-healthy foods.  Limit sodium if directed by your health care provider. Sodium restriction may reduce symptoms of heart failure in some people. Talk to a dietitian to learn more about heart-healthy seasonings.  Use healthy cooking methods. Healthy cooking methods include roasting, grilling, broiling, baking, poaching, steaming, or stir-frying. Talk to a dietitian to learn more about healthy cooking methods.  Limit fluids if directed by your health care provider. Fluid restriction may reduce symptoms of heart failure in some people.  Weigh yourself every day. Daily weights are important in the early recognition of excess fluid. You should weigh yourself every morning after you urinate and before you eat breakfast. Wear the same amount of clothing each time you weigh yourself. Record your daily weight. Provide your health care provider with your weight record.  Monitor and record your blood pressure if directed by your health care provider.  Check your pulse if directed by your health care provider.  Lose weight if directed by  your health care provider. Weight loss may reduce symptoms of heart failure in some people.  Stop smoking or chewing tobacco. Nicotine makes your heart work harder by causing your blood vessels to constrict. Do not use nicotine gum or patches before talking to your health care provider.  Keep all follow-up visits as directed by your health care provider. This is important.  Limit alcohol intake to no more than 1 drink per day for nonpregnant women and 2 drinks per day for men. One drink equals 12 ounces of beer, 5 ounces of wine, or 1 ounces of hard liquor. Drinking more than that is harmful to your heart. Tell your health care provider if you drink  alcohol several times a week. Talk with your health care provider about whether alcohol is safe for you. If your heart has already been damaged by alcohol or you have severe heart failure, drinking alcohol should be stopped completely.  Stop illicit drug use.  Stay up-to-date with immunizations. It is especially important to prevent respiratory infections through current pneumococcal and influenza immunizations.  Manage other health conditions such as hypertension, diabetes, thyroid disease, or abnormal heart rhythms as directed by your health care provider.  Learn to manage stress.  Plan rest periods when fatigued.  Learn strategies to manage high temperatures. If the weather is extremely hot:  Avoid vigorous physical activity.  Use air conditioning or fans or seek a cooler location.  Avoid caffeine and alcohol.  Wear loose-fitting, lightweight, and light-colored clothing.  Learn strategies to manage cold temperatures. If the weather is extremely cold:  Avoid vigorous physical activity.  Layer clothes.  Wear mittens or gloves, a hat, and a scarf when going outside.  Avoid alcohol.  Obtain ongoing education and support as needed.  Participate in or seek rehabilitation as needed to maintain or improve independence and quality of  life. SEEK MEDICAL CARE IF:   You have a rapid weight gain.  You have increasing shortness of breath that is unusual for you.  You are unable to participate in your usual physical activities.  You tire easily.  You cough more than normal, especially with physical activity.  You have any or more swelling in areas such as your hands, feet, ankles, or abdomen.  You are unable to sleep because it is hard to breathe.  You feel like your heart is beating fast (palpitations).  You become dizzy or light-headed upon standing up. SEEK IMMEDIATE MEDICAL CARE IF:   You have difficulty breathing.  There is a change in mental status such as decreased alertness or difficulty with concentration.  You have a pain or discomfort in your chest.  You have an episode of fainting (syncope). MAKE SURE YOU:   Understand these instructions.  Will watch your condition.  Will get help right away if you are not doing well or get worse.   This information is not intended to replace advice given to you by your health care provider. Make sure you discuss any questions you have with your health care provider.   Document Released: 01/19/2005 Document Revised: 06/05/2014 Document Reviewed: 02/19/2012 Elsevier Interactive Patient Education [employer].

## 2015-11-04 NOTE — Progress Notes (Signed)
11/04/2015 [name]   09/13/1940  [id number]  Primary Physician Vicenta Aly, [place] Primary Cardiologist: Dr. Radford Pax    Reason for Visit/CC: F/u for Chronic Systolic HF  HPI:  [name] is a 75 y.o. female  with a history of HTN, HLD, DM, previous breast cancer, and recently diagnosed NICM/chronic systolic CHF. She was admitted from 4/24-4/27/17. She intially presented with chest pain. Patient ruled out for MI. She underwent stress test and 2D ECHO (showed EF of 35-40%). Stress test with abnormal with fixed defect (EF 30%) and underwent cardiac catheterization on 05/29/2015 with no coronary artery disease, however she does have depressed LV function with EF of 30-40%. Symptoms felt to be due to new onset acute systolic congestive heart failure possibly 2/2 to radiation or chemotherapy for treatment of previous breast cancer. Coreg and lasix were initiated and she was continued on losartan and norvasc. She was last seen by Dr. Radford Pax on 07/17/15. She was doing fairly. Her HF meds were adjusted. Her Coreg was increased to 6.25 mg BID and spironolactone was added.   She was suppose to have followed up for mediation monitoring/ further titration but never did. She reports that a lot has taken place since then, and she hasn't been able to get back in for f/u. She was discovered to have a new right sided breast mass. She underwent a right breast seed guided excisional biopsy, excision right nipple mass, by Dr. Donne Hazel 08/09/15. Pathology was benign, per patient report. During that hospitalization, she did have a BMP that showed stable renal function but slightly elevated K at 5.3. She has also had issues with bronchitis and fell and broke her right arm months weeks ago.   She reports that she is doing better. She saw her orthopedist earlier this morning and her bone fracture has healed. From a cardiac standpoint, she has done well. She denies any issues with dyspnea. No fluid  retention/ weight gain or LEE. She sleeps with one pillow. No orthopnea or PND. No dest pain, palpitations, dizziness, syncope/ near syncope.  She has been fully compliant with daily weights. Also compliant with med's and adherent to a low sodium diet. BP and HR both well controlled today.     Current Meds  Medication Sig  . acyclovir (ZOVIRAX) 200 MG capsule TAKE 2 CAPSULES BY MOUTH 3 TIMES DAILY  . albuterol (PROVENTIL) (2.5 MG/3ML) 0.083% nebulizer solution Take 2.5 mg by nebulization every 6 (six) hours as needed for shortness of breath.   . ALPRAZolam (XANAX) 0.5 MG tablet TAKE ONE-HALF TO ONE TABLET BY MOUTH THREE TIMES DAILY AS NEEDED FOR  NERVES  . amLODipine (NORVASC) 5 MG tablet Take 1 tablet (5 mg total) by mouth daily.  Marland Kitchen aspirin EC 325 MG tablet Take 325 mg by mouth daily.  Marland Kitchen atorvastatin (LIPITOR) 40 MG tablet Take 40 mg by mouth daily.  . Blood Glucose Monitoring Suppl (EASY TALK BLOOD GLUCOSE SYSTEM) DEVI Use once daily as directed to check blood glucose  . carvedilol (COREG) 6.25 MG tablet Take 1 tablet (6.25 mg total) by mouth 2 (two) times daily with a meal.  . cholecalciferol (VITAMIN D) 1000 UNITS tablet Take 1 tablet (1,000 Units total) by mouth daily.  . cyanocobalamin 500 MCG tablet Take 1 tablet (500 mcg total) by mouth daily.  Marland Kitchen EASY TALK BLOOD GLUCOSE TEST test strip Use to check blood glucose once daily as directed  . HYDROcodone-acetaminophen (NORCO/VICODIN) 5-325 MG tablet Take 1-2 tablets every 6 hours as  needed for severe pain  . Insulin Glargine (LANTUS) 100 UNIT/ML Solostar Pen Inject 30 Units into the skin at bedtime.  . Insulin Pen Needle (RELION PEN NEEDLE 31G/8MM) 31G X 8 MM MISC 1 Device by Does not apply route 4 (four) times daily.  Marland Kitchen letrozole (FEMARA) 2.5 MG tablet TAKE ONE TABLET BY MOUTH DAILY.  Marland Kitchen Liraglutide (VICTOZA) 18 MG/3ML SOPN Inject 3 mLs (18 mg total) into the skin every morning.  Marland Kitchen losartan (COZAAR) 100 MG tablet Take 1 tablet (100 mg total)  by mouth daily.  . metFORMIN (GLUCOPHAGE) 500 MG tablet Take 500 mg by mouth daily as needed. Patient states she is taking as needed only if her blood sugar is high  . Multiple Vitamins-Minerals (ONE-A-DAY WOMENS 50+ ADVANTAGE) TABS Take 1 tablet by mouth daily.  Marland Kitchen omeprazole (PRILOSEC) 40 MG capsule Take 40 mg by mouth daily.  . sertraline (ZOLOFT) 50 MG tablet Take 1 tablet (50 mg total) by mouth daily.  Marland Kitchen spironolactone (ALDACTONE) 25 MG tablet Take 1 tablet (25 mg total) by mouth daily.   Allergies  Allergen Reactions  . Oxycodone-Acetaminophen Hives and Itching  . Cephalexin     REACTION: hives  . Clindamycin     REACTION: hives  . Dilaudid [Hydromorphone Hcl] Itching  . Oxycodone-Acetaminophen     REACTION: hives  . Rocephin [Ceftriaxone Sodium In Dextrose] Hives  . Sulfonamide Derivatives     REACTION: hives   Past Medical History:  Diagnosis Date  . ALLERGIC RHINITIS 12/13/2007  . ANXIETY 06/04/2008  . Asthma   . ASTHMATIC BRONCHITIS, ACUTE 06/04/2008  . Blood transfusion 1964   post childbirth  . 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 Chronic combined systolic (congestive) and diastolic (congestive) heart failure ([place]) 05/29/2015  . DEGENERATIVE JOINT DISEASE 12/17/2006  . DEPRESSION 08/10/2007  . Dilated cardiomyopathy ([place]) 05/29/2015   EF 35-40% - felt secondary to XRT and chemo for breast CA  . DM type 2 (diabetes mellitus, type 2) ([place]) 08/10/2007  . GERD 12/17/2006  . History of chemotherapy   . History of radiation therapy 10/07/11-11/24/11   left breast,5040 cGy/28 sessions,l chest wall boost=1000cGy/5 sesions  . 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 HYPERCHOLESTEROLEMIA 12/17/2006  . HYPERTENSION 12/17/2006  . OBESITY 06/04/2008  . Osteopenia due to cancer therapy 09/12/2013   Family History  Problem Relation Age of Onset  . COPD Mother   . Arthritis Mother   . Emphysema Mother   . Diabetes Brother   . Cancer Brother     prostate  .  Cancer Father     malignant brain tumor  . Mental illness Paternal Grandfather   . Heart disease Other     Sibling  . Diabetes Other     Sibling    Past Surgical History:  Procedure Laterality Date  . bladder tack  1974  . BREAST EXCISIONAL BIOPSY  11/10/1988   left benign Dr Margot Chimes  . BREAST EXCISIONAL BIOPSY  03/04/1983   Right - two areas - Dr Margot Chimes  . BREAST EXCISIONAL BIOPSY  10/09/1980   right - Dr Margot Chimes  . BREAST EXCISIONAL BIOPSY  10/18/1979   left - Dr Margot Chimes  . BREAST EXCISIONAL BIOPSY  01/02/1994   right - Dr Margot Chimes  . BREAST SURGERY    . CARDIAC CATHETERIZATION N/A 05/29/2015   Procedure: Left Heart Cath and Coronary Angiography;  Surgeon: Belva Crome, MD;  Location: [facility];  Service: Cardiovascular;  Laterality: N/A;  . CHOLECYSTECTOMY  02/23/1991   LC - Dr Margot Chimes  . COLONOSCOPY    . ENTEROCELE REPAIR  06/08   Dr. Cletis Media  . EVACUATION BREAST HEMATOMA  07/01/2011   Procedure: EVACUATION HEMATOMA BREAST;  Surgeon: Haywood Lasso, MD;  Location: [facility];  Service: General;  Laterality: Left;  Drainage of left mastectomy seroma  . HIP SURGERY  08/27/2010   DR. Maureen Ralphs  . LAMINECTOMY  1993   s/p-Dr. Rita Ohara  . MASTECTOMY Left 03/23/2011  . MODIFIED RADICAL MASTECTOMY W/ AXILLARY LYMPH NODE DISSECTION Left 03-30-11  . PORT-A-CATH REMOVAL  12/02/2011   Procedure: REMOVAL PORT-A-CATH;  Surgeon: Haywood Lasso, MD;  Location: [facility];  Service: General;  Laterality: N/A;  . PORTACATH PLACEMENT  04/13/2011   Procedure: INSERTION PORT-A-CATH;  Surgeon: Haywood Lasso, MD;  Location: [facility];  Service: General;  Laterality: N/A;  porta cath placement,removal of J-P drain  . RADIOACTIVE SEED GUIDED EXCISIONAL BREAST BIOPSY Right 08/09/2015   Procedure: RIGHT RADIOACTIVE SEED GUIDED EXCISIONAL BREAST BIOPSY, RIGHT NIPPLE LESION EXCISION;  Surgeon: Rolm Bookbinder, MD;  Location: [facility];  Service:  General;  Laterality: Right;  . ROTATOR CUFF REPAIR  8/03   right, Dr. Tonita Cong  . ROTATOR CUFF REPAIR  10/26/2008   left, Dr. Rush Farmer  . TONSILLECTOMY    . VAGINAL HYSTERECTOMY  1974   partial    Social History   Social History  . Marital status: Married    Spouse name: N/A  . Number of children: 3  . Years of education: N/A   Occupational History  .  Retired   Social History Main Topics  . Smoking status: Never Smoker  . Smokeless tobacco: Never Used  . Alcohol use No  . Drug use: No  . Sexual activity: Yes   Other Topics Concern  . 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  Cardiovascular: negative for chest pain, dyspnea on exertion, edema, orthopnea, palpitations, paroxysmal nocturnal dyspnea or shortness of breath Dermatological: negative for rash Respiratory: negative for cough or wheezing Urologic: negative for hematuria Abdominal: negative for nausea, vomiting, diarrhea, bright red blood per rectum, melena, or hematemesis Neurologic: negative for visual changes, syncope, or dizziness All other systems reviewed and are otherwise negative except as noted above.   Physical Exam:  Blood pressure 122/80, pulse 86, height 5\' 8"  (1.727 m), weight 183 lb (83 kg), SpO2 95 %.  General appearance: alert, cooperative and no distress Neck: no carotid bruit and no JVD Lungs: clear to auscultation bilaterally Heart: regular rate and rhythm, S1, S2 normal, no murmur, click, rub or gallop Extremities: no LEE Pulses: 2+ and symmetric Skin: warm and dry Neurologic: Grossly normal  EKG not performed  ASSESSMENT AND PLAN:   1. Chronic Systolic HF: EF 123456 by echo 05/2015. NICM. Normal LHC.  ? If chemotherapy induced.   She has been stable on medial therapy with BB, ARB and spirolactone.  She is euvolemic on physical exam. Lungs are CTAB. No JVD and no LEE. She has been asymptomatic w/o resting dyspnea, exertional dyspnea, orthopnea, PND or  LEE. She has been fully compliant with meds, daily weights and low sodium diet. BP and HR are both stable and well controlled. BMP in July showed normal renal function but slightly elevated K at 5.3. Given she is on both losartan and spironolactone, we will check a BMP today to reassess renal function and K. Continue current medication regimen, daily weights and low sodium diet. Patient advised to contact our office if > 3 lb weight gain in 24 hr or > 5 lb in 1 week. She verbalized understanding.   PLAN f/u with Dr. Radford Pax in 3-6 months    Brittainy Simmons PA-C 11/04/2015 3:01 PM

## 2015-11-25 ENCOUNTER — Ambulatory Visit (INDEPENDENT_AMBULATORY_CARE_PROVIDER_SITE_OTHER): Payer: Medicare HMO | Admitting: Orthopaedic Surgery

## 2015-12-16 ENCOUNTER — Ambulatory Visit (INDEPENDENT_AMBULATORY_CARE_PROVIDER_SITE_OTHER): Payer: Medicare HMO | Admitting: Orthopaedic Surgery

## 2015-12-24 ENCOUNTER — Ambulatory Visit: Payer: Medicare HMO | Admitting: Radiation Oncology

## 2016-03-04 ENCOUNTER — Telehealth: Payer: Self-pay | Admitting: Cardiology

## 2016-03-04 NOTE — Telephone Encounter (Signed)
Nonda Lou from [facility] is calling because the patient had an abnormal EKG and I symptomatic. Please call and ask for Clarene Critchley, [phone]. Thanks.

## 2016-03-04 NOTE — Telephone Encounter (Signed)
Left message for patient to call back.  Also attempted to call [facility]. Number given kept ringing and ringing and no one ever picked up.  Will try again later.

## 2016-03-05 NOTE — Telephone Encounter (Signed)
New Message    Please call wants to speak with [name], she is returning your call

## 2016-03-05 NOTE — Telephone Encounter (Signed)
Spoke with patient, who exclaimed she was very irritated she had not been called back. When told a message had been left, she stated "whatever." She states she was in atrial fibrillation at her PCP and needs to be seen ASAP. She reports she is not out of rhythm now and is asymptomatic other than being dehydrated.  She demands an office visit today. Per Dr. Radford Pax, the EKG from the office visit is needed.  Called PCP x2 - phone kept ringing and no answering service picked up.  Called again- spoke with Clarene Critchley, NP. She states the patient was in atrial fibrillation at her office visit. She started her on Eliquis 5 mg BID. Instructed her to fax copy of EKG and office note to [phone].  Since patient has been started on Eliquis, Dr. Radford Pax states it is OK to have patient evaluated early next week.   Spoke to patient. Offered her multiple appointments next week but she is having a facial and getting her hair done.  Scheduled her Thursday, 2/8 with Melina Copa.  She understands to take her Eliquis and Coreg as directed. She was grateful for call.

## 2016-03-11 ENCOUNTER — Encounter: Payer: Self-pay | Admitting: Physician Assistant

## 2016-03-11 NOTE — Progress Notes (Signed)
Cardiology Office Note    Date:  03/12/2016  ID:  [name], DOB [birth year], MRN [id number] PCP:  Vicenta Aly, FNP  Cardiologist:  Dr. Radford Pax   Chief Complaint: atrial ifb  History of Present Illness:  [name] is a 76 y.o. female with history of HTN, HLD, DM, previous breast cancer, NICM/chronic combined CHF, anxiety, asthma who is here to review recently diagnosed atrial fib. She was diagnosed with CHF in 2017 at which time nuc was abnormal and 2D echo 05/28/15 showed mild LVH, EF 35-40%, grade 1 DD, moderate focal AV calcification. LHC 05/29/15 showed normal coronaries, EF 30-40%, normal LVEDP, etiology of CM uncertain but could be related to late effect of chemo 2/2 breast CA.   She saw primary care on 03/03/16 at which time she was describing "fluid in ears causing dizziness" as well as palpitations and fatigue. She has a history of dizziness/vertigo previously evaluated by ENT. 12 lead EKG was reported out as atrial fib (HR controlled). She was started on Eliquis and urgently referred for appropriate cardiology evaluation. Comprehensive labs showed A1C 6.0, glucose 107, BUN 19, Cr 0.78, Na 143, K 4.0, Cl 98, CO2 25, albumin 4.1, LFTs wnl except alk phos 128, Hgb 14.7, WBC 7.9, Hct 42.6, Plt 283, TSH 1.670, BNP 55.  Upon review of the EKG from PCP's office, this actually appears to represent NSR with sinus arrhythmia versus PACs. It is in lead V3 where you can most clearly see evidence of P wave followed by QRS. The misleading fibrillatory appearance in the other leads seems to be related to some sort of underlying baseline tremor the patient has. This is further supported by tracings obtained here in the office in which there is significant artifact in some leads, but clear cut P waves followed by QRS complexes in lead I, avL, and V2 for example. There are intermittent PVCs noted.The patient reports intermittent palpitations described as either her heart pounding or a few  beats quickly in a row. These are sometime associated with SOB. The palpitations can last from a few seconds up to a minute if she had to guess. She has not had any chest pain or syncope. She does not have any significant edema. She describes generalized lightheadedness/dizziness that can go on for hours and is even happening today while in the office today with normal blood pressure and HR. She also described the room spinning upon changing head position.   Orthostatics:   Sitting 122/74, P87  Standing 30mn - 109/74, P96  Standing 331m - 126/69, P84   Past Medical History:  Diagnosis Date  . ALLERGIC RHINITIS 12/13/2007  . ANXIETY 06/04/2008  . Asthma   . Blood transfusion 1964   post childbirth  . 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 Chronic combined systolic (congestive) and diastolic (congestive) heart failure   . DEGENERATIVE JOINT DISEASE 12/17/2006  . DEPRESSION 08/10/2007  . Dilated cardiomyopathy ([facility]04/26/2017   a. Dx 05/2015 - EF 35-40% - felt secondary to XRT and chemo for breast CA. Normal coronaries by cath.  . DM type 2 (diabetes mellitus, type 2) ([facility]  . Essential hypertension   . GERD 12/17/2006  . History of chemotherapy   . History of radiation therapy 10/07/11-11/24/11   left breast,5040 cGy/28 sessions,l chest wall boost=1000cGy/5 sesions  . 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 Hyperlipidemia   . OBESITY 06/04/2008  . Osteopenia due to cancer  therapy 09/12/2013    Past Surgical History:  Procedure Laterality Date  . bladder tack  1974  . BREAST EXCISIONAL BIOPSY  11/10/1988   left benign Dr Margot Chimes  . BREAST EXCISIONAL BIOPSY  03/04/1983   Right - two areas - Dr Margot Chimes  . BREAST EXCISIONAL BIOPSY  10/09/1980   right - Dr Margot Chimes  . BREAST EXCISIONAL BIOPSY  10/18/1979   left - Dr Margot Chimes  . BREAST EXCISIONAL BIOPSY  01/02/1994   right - Dr Margot Chimes  . BREAST SURGERY    . CARDIAC CATHETERIZATION N/A 05/29/2015   Procedure: Left  Heart Cath and Coronary Angiography;  Surgeon: Belva Crome, MD;  Location: [facility];  Service: Cardiovascular;  Laterality: N/A;  . CHOLECYSTECTOMY  02/23/1991   LC - Dr Margot Chimes  . COLONOSCOPY    . ENTEROCELE REPAIR  06/08   Dr. Cletis Media  . EVACUATION BREAST HEMATOMA  07/01/2011   Procedure: EVACUATION HEMATOMA BREAST;  Surgeon: Haywood Lasso, MD;  Location: [facility];  Service: General;  Laterality: Left;  Drainage of left mastectomy seroma  . HIP SURGERY  08/27/2010   DR. Maureen Ralphs  . LAMINECTOMY  1993   s/p-Dr. Rita Ohara  . MASTECTOMY Left 03/23/2011  . MODIFIED RADICAL MASTECTOMY W/ AXILLARY LYMPH NODE DISSECTION Left 03-30-11  . PORT-A-CATH REMOVAL  12/02/2011   Procedure: REMOVAL PORT-A-CATH;  Surgeon: Haywood Lasso, MD;  Location: [facility];  Service: General;  Laterality: N/A;  . PORTACATH PLACEMENT  04/13/2011   Procedure: INSERTION PORT-A-CATH;  Surgeon: Haywood Lasso, MD;  Location: [facility];  Service: General;  Laterality: N/A;  porta cath placement,removal of J-P drain  . RADIOACTIVE SEED GUIDED EXCISIONAL BREAST BIOPSY Right 08/09/2015   Procedure: RIGHT RADIOACTIVE SEED GUIDED EXCISIONAL BREAST BIOPSY, RIGHT NIPPLE LESION EXCISION;  Surgeon: Rolm Bookbinder, MD;  Location: [facility];  Service: General;  Laterality: Right;  . ROTATOR CUFF REPAIR  8/03   right, Dr. Tonita Cong  . ROTATOR CUFF REPAIR  10/26/2008   left, Dr. Rush Farmer  . TONSILLECTOMY    . VAGINAL HYSTERECTOMY  1974   partial     Current Medications: Current Outpatient Prescriptions  Medication Sig Dispense Refill  . acyclovir (ZOVIRAX) 200 MG capsule TAKE 2 CAPSULES BY MOUTH 3 TIMES DAILY 540 capsule 3  . albuterol (PROVENTIL) (2.5 MG/3ML) 0.083% nebulizer solution Take 2.5 mg by nebulization every 6 (six) hours as needed for shortness of breath.     . ALPRAZolam (XANAX) 0.5 MG tablet TAKE ONE-HALF TO ONE TABLET BY MOUTH THREE TIMES DAILY AS NEEDED  FOR  NERVES 270 tablet 1  . amLODipine (NORVASC) 5 MG tablet Take 1 tablet (5 mg total) by mouth daily. 90 tablet 3  . apixaban (ELIQUIS) 5 MG TABS tablet Take 5 mg by mouth 2 (two) times daily.    Marland Kitchen aspirin EC 325 MG tablet Take 325 mg by mouth daily.    Marland Kitchen atorvastatin (LIPITOR) 40 MG tablet Take 40 mg by mouth daily.    . carvedilol (COREG) 6.25 MG tablet Take 1 tablet (6.25 mg total) by mouth 2 (two) times daily with a meal. 180 tablet 3  . cholecalciferol (VITAMIN D) 1000 UNITS tablet Take 1 tablet (1,000 Units total) by mouth daily. 90 tablet 3  . cyanocobalamin 500 MCG tablet Take 1 tablet (500 mcg total) by mouth daily. 90 tablet 3  . Insulin Glargine (LANTUS) 100 UNIT/ML Solostar Pen Inject 30 Units into the skin at bedtime.  60 mL 3  . Insulin Pen Needle (RELION PEN NEEDLE 31G/8MM) 31G X 8 MM MISC 1 Device by Does not apply route 4 (four) times daily. 120 each 11  . letrozole (FEMARA) 2.5 MG tablet TAKE ONE TABLET BY MOUTH DAILY. 90 tablet 12  . Liraglutide (VICTOZA) 18 MG/3ML SOPN Inject 3 mLs (18 mg total) into the skin every morning. 6 mL   . losartan (COZAAR) 100 MG tablet Take 1 tablet (100 mg total) by mouth daily. 90 tablet 3  . Multiple Vitamins-Minerals (ONE-A-DAY WOMENS 50+ ADVANTAGE) TABS Take 1 tablet by mouth daily. 90 tablet 3  . omeprazole (PRILOSEC) 40 MG capsule Take 40 mg by mouth daily.    . sertraline (ZOLOFT) 50 MG tablet Take 1 tablet (50 mg total) by mouth daily. 90 tablet 3  . spironolactone (ALDACTONE) 25 MG tablet Take 1 tablet (25 mg total) by mouth daily. 30 tablet 11   No current facility-administered medications for this visit.      Allergies:   Oxycodone-acetaminophen; Cephalexin; Clindamycin; Dilaudid [hydromorphone hcl]; Oxycodone-acetaminophen; Rocephin [ceftriaxone sodium in dextrose]; and Sulfonamide derivatives   Social History   Social History  . Marital status: Married    Spouse name: N/A  . Number of children: 3  . Years of education: N/A     Occupational History  .  Retired   Social History Main Topics  . Smoking status: Never Smoker  . Smokeless tobacco: Never Used  . Alcohol use No  . Drug use: No  . Sexual activity: Yes   Other Topics Concern  . 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   ROS:   Please see the history of present illness. + Poor appetite.  All other systems are reviewed and otherwise negative.    PHYSICAL EXAM:   VS:  BP 120/82   Pulse 87   Ht '5\' 8"'$  (1.727 m)   Wt 175 lb (79.4 kg)   BMI 26.61 kg/m   BMI: Body mass index is 26.61 kg/m. GEN: Well nourished, well developed WF, in no acute distress  HEENT: normocephalic, atraumatic Neck: no JVD, carotid bruits, or masses Cardiac: RRR occ ectopy; no murmurs, rubs, or gallops, no edema  Respiratory:  clear to auscultation bilaterally, normal work of breathing GI: soft, nontender, nondistended, + BS MS: no deformity or atrophy  Skin: warm and dry, no rash Neuro:  Alert and Oriented x 3, Strength and sensation are intact, follows commands Psych: euthymic mood, full affect  Wt Readings from Last 3 Encounters:  03/12/16 175 lb (79.4 kg)  11/04/15 183 lb (83 kg)  08/09/15 189 lb (85.7 kg)      Studies/Labs Reviewed:   EKG:  EKG was ordered today and personally reviewed by me and demonstrates NSR with occaisonal PVCs  Recent Labs: 04/18/2015: ALT 14 05/29/2015: B Natriuretic Peptide 90.4 08/08/2015: Hemoglobin 14.0; Platelets 215 11/04/2015: BUN 17; Creat 0.73; Potassium 4.1; Sodium 142   Lipid Panel    Component  Value Date/Time  CHOL 149 05/30/2015 0535   TRIG 94 05/30/2015 0535   HDL 35 (L) 05/30/2015 0535   CHOLHDL 4.3 05/30/2015 0535   VLDL 19 05/30/2015 0535   LDLCALC 95 05/30/2015 0535   LDLDIRECT 212.7 02/06/2011 1018    Additional studies/ records that were reviewed today include: Summarized above.    ASSESSMENT & PLAN:   1. Palpitations - see above, no clear-cut evidence of atrial fib. Reviewed tracings with Dr. Johnsie Cancel (doctor of the day) who agrees this is sinus rhythm with PVCs/artifact. Stop Eliquis. Given patient's report of palpitations, however, it is prudent to obtain a 30 day event monitor to exclude paroxysms of atrial fib. I suspect some of her symptoms are related to her PVCs. However, she also has a component of dizziness that does not sound related to either pulse rate or blood pressure - has h/o vertigo. Will defer further workup of this to primary care. I discussed 2 different options with the patient - we could either leave regimen as it is and try to capture palpitations as they are, or adjust her medication to see if we can get a better handle on her ectopy meanwhile still monitoring them. She prefers the latter. She states she was actually only taking her carvedilol once a day. Will increase to twice a day. Given her borderline BP upon standing, will stop amlodipine to accommodate this titration.  2. PVCs - as above. Check Mg for completeness. 3. Chronic combined CHF - appears euvolemic. Weight actually decreasing. She does not drink a lot of water. We reviewed sodium restriction. 4. NICM - see above. Will d/c amlodipine.  5. Essential HTN - follow BP with med changes. If BP begins to rise, would optimize the regimen to further address her cardiomyopathy.  Disposition: F/u with me after event monitor.  Medication Adjustments/Labs and Tests Ordered: Current medicines are reviewed at length with the patient today.  Concerns regarding medicines are outlined above.  Medication changes, Labs and Tests ordered today are summarized above and listed in the Patient Instructions accessible in Encounters.   Raechel Ache PA-C  03/12/2016 11:46 AM    [facility] [place], [place], [place]  [address] Phone: [phone]; Fax: [phone]

## 2016-03-12 ENCOUNTER — Encounter: Payer: Self-pay | Admitting: Physician Assistant

## 2016-03-12 ENCOUNTER — Ambulatory Visit (INDEPENDENT_AMBULATORY_CARE_PROVIDER_SITE_OTHER): Payer: Medicare HMO | Admitting: Physician Assistant

## 2016-03-12 VITALS — BP 120/82 | HR 87 | Ht 68.0 in | Wt 175.0 lb

## 2016-03-12 DIAGNOSIS — I428 Other cardiomyopathies: Secondary | ICD-10-CM | POA: Diagnosis not present

## 2016-03-12 DIAGNOSIS — I5042 Chronic combined systolic (congestive) and diastolic (congestive) heart failure: Secondary | ICD-10-CM

## 2016-03-12 DIAGNOSIS — I10 Essential (primary) hypertension: Secondary | ICD-10-CM | POA: Diagnosis not present

## 2016-03-12 DIAGNOSIS — R002 Palpitations: Secondary | ICD-10-CM | POA: Diagnosis not present

## 2016-03-12 DIAGNOSIS — I493 Ventricular premature depolarization: Secondary | ICD-10-CM

## 2016-03-12 NOTE — Patient Instructions (Signed)
**Note De-Identified [name] Obfuscation** Medication Instructions:  Do not take Amlodipine for now-do not resume until advised by provider. Stop taking Eliquis. Be sure to take Carvedilol 6.25 mg twice daily.  Labwork: Today-Magnesium Level.  Testing/Procedures: Your physician has recommended that you wear an event monitor. Event monitors are medical devices that record the heart's electrical activity. Doctors most often [place] these monitors to diagnose arrhythmias. Arrhythmias are problems with the speed or rhythm of the heartbeat. The monitor is a small, portable device. You can wear one while you do your normal daily activities. This is usually used to diagnose what is causing palpitations/syncope (passing out).   Follow-Up: After Monitor is complete.     If you need a refill on your cardiac medications before your next appointment, please call your pharmacy.

## 2016-03-13 ENCOUNTER — Telehealth: Payer: Self-pay | Admitting: *Deleted

## 2016-03-13 LAB — MAGNESIUM: MAGNESIUM: 1.8 mg/dL (ref 1.6–2.3)

## 2016-03-13 MED ORDER — MAGNESIUM OXIDE 400 MG PO TABS: 400.0000 mg | ORAL_TABLET | Freq: Every day | ORAL | 0 refills | Status: DC

## 2016-03-13 MED ORDER — MAGNESIUM OXIDE 400 MG PO TABS: 400.0000 mg | ORAL_TABLET | Freq: Every day | ORAL | 3 refills | Status: DC

## 2016-03-13 NOTE — Telephone Encounter (Signed)
-----   Message from Charlie Pitter, PA-C sent at 03/13/2016  7:53 AM EST ----- Please let patient know magnesium level is borderline low. We like this closer to 2.0 when having PVCs/palpitations. Recommend MagOx 400mg  daily. Dayna Dunn PA-C

## 2016-03-13 NOTE — Telephone Encounter (Signed)
Pt hs been made aware of her lab results and she has verbalized understanding. MagOx 400 q daily. Rx [facility] [place], per pt request, and #90 to [facility]. Pt verbalized understanding

## 2016-03-16 ENCOUNTER — Ambulatory Visit (INDEPENDENT_AMBULATORY_CARE_PROVIDER_SITE_OTHER): Payer: Medicare HMO

## 2016-03-16 DIAGNOSIS — R002 Palpitations: Secondary | ICD-10-CM

## 2016-03-30 ENCOUNTER — Encounter: Payer: Self-pay | Admitting: Physician Assistant

## 2016-03-30 ENCOUNTER — Other Ambulatory Visit: Payer: Self-pay

## 2016-03-30 MED ORDER — MAGNESIUM OXIDE 400 MG PO TABS: 400.0000 mg | ORAL_TABLET | Freq: Every day | ORAL | 1 refills | Status: DC

## 2016-04-02 ENCOUNTER — Telehealth: Payer: Self-pay

## 2016-04-02 NOTE — Telephone Encounter (Signed)
**Note De-Identified Saber Dickerman Obfuscation** Barkley Boards, RN  Deliah Boston Virgil Lightner, LPN        Wyonia Hough,   Can you help me with this pt's Mag Ox? I am in triage today and pt sent a my chart message.   Hinton Dyer you sent a prescription to [facility] for magnesium oxide 400 mg and I hv not recd it. It is not covered by [facility] and has to b prior authorized. [employer] told me if you call [phone] you can authorize it. Until this is cleared up, [facility]s [facility] [phone]. Hope this makes sense. Thank You   [facility]. I called the number she provided for [facility] and it said to use Cover my meds which I cannot get in to. Can you show me what to do?   Ander Purpura

## 2016-04-02 NOTE — Telephone Encounter (Signed)
I called and spoke with Sunday Corn at [facility] concerning PA for the pts Magnesium. PA done over the phone. Sunday Corn states that we should expect a response from them within 24 to 72 hours.

## 2016-04-15 ENCOUNTER — Other Ambulatory Visit: Payer: Self-pay | Admitting: Emergency Medicine

## 2016-04-15 DIAGNOSIS — C50312 Malignant neoplasm of lower-inner quadrant of left female breast: Secondary | ICD-10-CM

## 2016-04-16 ENCOUNTER — Ambulatory Visit: Payer: Medicare HMO | Admitting: Hematology and Oncology

## 2016-04-16 ENCOUNTER — Telehealth: Payer: Self-pay | Admitting: Hematology and Oncology

## 2016-04-16 ENCOUNTER — Other Ambulatory Visit: Payer: Medicare HMO

## 2016-04-16 NOTE — Telephone Encounter (Signed)
Pt called to cancel her appt with Dr Lindi Adie today. She is not feeling well. c/o epigastric pain for several weeks and vomiting about 1x/week. She is 5 years breast cancer free. Her last appt with Dr Lindi Adie was 07/02/15. Instructed her to call her PCP and we will reschedule her appt at [facility].

## 2016-04-16 NOTE — Telephone Encounter (Signed)
Patient called and I canceled her appointment for today per her request due to being sick.  She need to reschedule her appointment for 3/15 (Lab and Dr)

## 2016-04-16 NOTE — Assessment & Plan Note (Deleted)
Left breast invasive lobular cancer diagnosed 02/20/2011 status post left mastectomy with axillary lymph node dissection, 6 cm ILC with LVI, PNI, 9/16 lymph nodes positive with extracapsular extension, ER 100%, PR 100%, HER-2 -1.31, Ki-67 18%, T3a N2 a M0 stage IIIa status post F AC chemotherapy 4 followed by radiation and currently on letrozole 2.5 mg daily since 10/16/2011  Letrozole toxicities: 1. Hot flashes but not very severe 2. Occasional chest tightness I recommended extended adjuvant antiestrogen therapy based upon high risk features or diagnosis.  Breast cancer surveillance: 1. Mammogram 6 2017 right breast 7 mm indeterminate mass 11:00 position, breast density category C, ultrasound biopsy showed ADH 2. Chest wall and breast exam 07/02/2015  3. Bone density 03/06/2013 showed a T score of -2.1 Prolia since March 2016. Patient will need another bone density test  Nipple discharge right breast: Resolved after surgery 07/08/2015: Right breast biopsy atypical ductal hyperplasia Right lumpectomy 08/09/2015: No residual ADH, fibrocystic changes no malignancy

## 2016-04-18 ENCOUNTER — Telehealth: Payer: Self-pay | Admitting: Hematology and Oncology

## 2016-04-18 NOTE — Telephone Encounter (Signed)
Spoke with patient re lab/fu 3/28

## 2016-04-20 ENCOUNTER — Other Ambulatory Visit: Payer: Self-pay | Admitting: Cardiology

## 2016-04-21 ENCOUNTER — Encounter: Payer: Self-pay | Admitting: Physician Assistant

## 2016-04-21 DIAGNOSIS — I491 Atrial premature depolarization: Secondary | ICD-10-CM | POA: Insufficient documentation

## 2016-04-21 NOTE — Progress Notes (Deleted)
Cardiology Office Note    Date:  04/21/2016  ID:  [name], DOB 1941/01/16, MRN [id number] PCP:  Vicenta Aly, FNP  Cardiologist:  Dr. Radford Pax   Chief Complaint: f/u palpitations  History of Present Illness:  [name] is a 76 y.o. female with history of HTN, HLD, DM, previous breast cancer, NICM/chronic combined CHF, anxiety, asthma, baseline tremor who is here to review cardiac resting. She was diagnosed with CHF in 2017 at which time nuc was abnormal and 2D echo 05/28/15 showed mild LVH, EF 35-40%, grade 1 DD, moderate focal AV calcification. LHC 05/29/15 showed normal coronaries, EF 30-40%, normal LVEDP, etiology of CM uncertain but could be related to late effect of chemo 2/2 breast CA. She was seen by primary care 02/2016 at which time she was describing "fluid in ears causing dizziness" as well as palpitations and fatigue. EKG was misinterpreted as atrial fib but actually demonstrated NSR with sinus arrhythmia versus PACs with baseline artifact related to tremor. Comprehensive labs showed A1C 6.0, glucose 107, BUN 19, Cr 0.78, Na 143, K 4.0, Cl 98, CO2 25, albumin 4.1, LFTs wnl except alk phos 128, Hgb 14.7, WBC 7.9, Hct 42.6, Plt 283, TSH 1.670, BNP 55. When I saw her in clinic she did have mild drop in BP from 122/74 to 109/74 but dizziness was largely felt vertiginous (room spinning while changing head position). Her carvedilol was increased to BID (had been only taking it once a day; amlodipine was stopped to acommodate this change). We ordered an event monitor which showed NSR with average HR 71, frequent PACs, occasional PVCs, and sinus bradycardia as low as 50. Mg level was 1.8, prompting rx of MagOx '400mg'$  daily.   Palpitations c/w PACs PVCs Chronic combined CHF NICM Essential HTN       Past Medical History:  Diagnosis Date  . ALLERGIC RHINITIS 12/13/2007  . ANXIETY 06/04/2008  . Asthma   . Blood transfusion 1964   post childbirth  . 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 Chronic combined systolic (congestive) and diastolic (congestive) heart failure   . DEGENERATIVE JOINT DISEASE 12/17/2006  . DEPRESSION 08/10/2007  . Dilated cardiomyopathy ([place]) 05/29/2015   a. Dx 05/2015 - EF 35-40% - felt secondary to XRT and chemo for breast CA. Normal coronaries by cath.  . DM type 2 (diabetes mellitus, type 2) ([place])   . Essential hypertension   . GERD 12/17/2006  . History of chemotherapy   . History of radiation therapy 10/07/11-11/24/11   left breast,5040 cGy/28 sessions,l chest wall boost=1000cGy/5 sesions  . 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 Hyperlipidemia   . OBESITY 06/04/2008  . 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 Premature atrial contractions   . Sinus bradycardia    a. 2018 event monitor which showed NSR with average HR 71, frequent PACs, occasional PVCs, and sinus bradycardia as low as 50.    Past Surgical History:  Procedure Laterality Date  . bladder tack  1974  . BREAST EXCISIONAL BIOPSY  11/10/1988   left benign Dr Margot Chimes  . BREAST EXCISIONAL BIOPSY  03/04/1983   Right - two areas - Dr Margot Chimes  . BREAST EXCISIONAL BIOPSY  10/09/1980   right - Dr Margot Chimes  . BREAST EXCISIONAL BIOPSY  10/18/1979   left - Dr Margot Chimes  . BREAST EXCISIONAL BIOPSY  01/02/1994   right - Dr Margot Chimes  . BREAST SURGERY    .  CARDIAC CATHETERIZATION N/A 05/29/2015   Procedure: Left Heart Cath and Coronary Angiography;  Surgeon: Belva Crome, MD;  Location: [facility];  Service: Cardiovascular;  Laterality: N/A;  . CHOLECYSTECTOMY  02/23/1991   LC - Dr Margot Chimes  . COLONOSCOPY    . ENTEROCELE REPAIR  06/08   Dr. Cletis Media  . EVACUATION BREAST HEMATOMA  07/01/2011   Procedure: EVACUATION HEMATOMA BREAST;  Surgeon: Haywood Lasso, MD;  Location: [facility];  Service: General;  Laterality: Left;  Drainage of left mastectomy seroma  . HIP SURGERY  08/27/2010   DR. Maureen Ralphs  . LAMINECTOMY  1993   s/p-Dr. Rita Ohara  .  MASTECTOMY Left 03/23/2011  . MODIFIED RADICAL MASTECTOMY W/ AXILLARY LYMPH NODE DISSECTION Left 03-30-11  . PORT-A-CATH REMOVAL  12/02/2011   Procedure: REMOVAL PORT-A-CATH;  Surgeon: Haywood Lasso, MD;  Location: [facility];  Service: General;  Laterality: N/A;  . PORTACATH PLACEMENT  04/13/2011   Procedure: INSERTION PORT-A-CATH;  Surgeon: Haywood Lasso, MD;  Location: [facility];  Service: General;  Laterality: N/A;  porta cath placement,removal of J-P drain  . RADIOACTIVE SEED GUIDED EXCISIONAL BREAST BIOPSY Right 08/09/2015   Procedure: RIGHT RADIOACTIVE SEED GUIDED EXCISIONAL BREAST BIOPSY, RIGHT NIPPLE LESION EXCISION;  Surgeon: Rolm Bookbinder, MD;  Location: [facility];  Service: General;  Laterality: Right;  . ROTATOR CUFF REPAIR  8/03   right, Dr. Tonita Cong  . ROTATOR CUFF REPAIR  10/26/2008   left, Dr. Rush Farmer  . TONSILLECTOMY    . VAGINAL HYSTERECTOMY  1974   partial     Current Medications: Current Outpatient Prescriptions  Medication Sig Dispense Refill  . acyclovir (ZOVIRAX) 200 MG capsule TAKE 2 CAPSULES BY MOUTH 3 TIMES DAILY 540 capsule 3  . albuterol (PROVENTIL) (2.5 MG/3ML) 0.083% nebulizer solution Take 2.5 mg by nebulization every 6 (six) hours as needed for shortness of breath.     . ALPRAZolam (XANAX) 0.5 MG tablet TAKE ONE-HALF TO ONE TABLET BY MOUTH THREE TIMES DAILY AS NEEDED FOR  NERVES 270 tablet 1  . amLODipine (NORVASC) 5 MG tablet TAKE 1 TABLET (5 MG TOTAL) BY MOUTH DAILY. 90 tablet 3  . aspirin EC 325 MG tablet Take 325 mg by mouth daily.    Marland Kitchen atorvastatin (LIPITOR) 40 MG tablet Take 40 mg by mouth daily.    . carvedilol (COREG) 6.25 MG tablet TAKE 1 TABLET TWICE DAILY WITH A MEAL 180 tablet 3  . cholecalciferol (VITAMIN D) 1000 UNITS tablet Take 1 tablet (1,000 Units total) by mouth daily. 90 tablet 3  . cyanocobalamin 500 MCG tablet Take 1 tablet (500 mcg total) by mouth daily. 90 tablet 3  . Insulin  Glargine (LANTUS) 100 UNIT/ML Solostar Pen Inject 30 Units into the skin at bedtime. 60 mL 3  . Insulin Pen Needle (RELION PEN NEEDLE 31G/8MM) 31G X 8 MM MISC 1 Device by Does not apply route 4 (four) times daily. 120 each 11  . letrozole (FEMARA) 2.5 MG tablet TAKE ONE TABLET BY MOUTH DAILY. 90 tablet 12  . Liraglutide (VICTOZA) 18 MG/3ML SOPN Inject 3 mLs (18 mg total) into the skin every morning. 6 mL   . losartan (COZAAR) 100 MG tablet Take 1 tablet (100 mg total) by mouth daily. 90 tablet 3  . magnesium oxide (MAG-OX) 400 MG tablet Take 1 tablet (400 mg total) by mouth daily. 30 tablet 1  . Multiple Vitamins-Minerals (ONE-A-DAY WOMENS 50+ ADVANTAGE) TABS Take 1 tablet by  mouth daily. 90 tablet 3  . omeprazole (PRILOSEC) 40 MG capsule Take 40 mg by mouth daily.    . sertraline (ZOLOFT) 50 MG tablet Take 1 tablet (50 mg total) by mouth daily. 90 tablet 3  . spironolactone (ALDACTONE) 25 MG tablet Take 1 tablet (25 mg total) by mouth daily. 30 tablet 11   No current facility-administered medications for this visit.      Allergies:   Oxycodone-acetaminophen; Cephalexin; Clindamycin; Dilaudid [hydromorphone hcl]; Oxycodone-acetaminophen; Rocephin [ceftriaxone sodium in dextrose]; and Sulfonamide derivatives   Social History   Social History  . Marital status: Married    Spouse name: N/A  . Number of children: 3  . Years of education: N/A   Occupational History  .  Retired   Social History Main Topics  . Smoking status: Never Smoker  . Smokeless tobacco: Never Used  . Alcohol use No  . Drug use: No  . Sexual activity: Yes   Other Topics Concern  . 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 COPD Mother   . Arthritis Mother   . Emphysema  Mother   . Diabetes Brother   . Cancer Brother     prostate  . Cancer Father     malignant brain tumor  . Mental illness Paternal Grandfather   . Heart disease Other     Sibling  . Diabetes Other     Sibling    ***  ROS:   Please see the history of present illness. Otherwise, review of systems is positive for ***.  All other systems are reviewed and otherwise negative.    PHYSICAL EXAM:   VS:  There were no vitals taken for this visit.  BMI: There is no height or weight on file to calculate BMI. GEN: Well nourished, well developed, in no acute distress  HEENT: normocephalic, atraumatic Neck: no JVD, carotid bruits, or masses Cardiac: ***RRR; no murmurs, rubs, or gallops, no edema  Respiratory:  clear to auscultation bilaterally, normal work of breathing GI: soft, nontender, nondistended, + BS MS: no deformity or atrophy  Skin: warm and dry, no rash Neuro:  Alert and Oriented x 3, Strength and sensation are intact, follows commands Psych: euthymic mood, full affect  Wt Readings from Last 3 Encounters:  03/12/16 175 lb (79.4 kg)  11/04/15 183 lb (83 kg)  08/09/15 189 lb (85.7 kg)      Studies/Labs Reviewed:   EKG:  EKG was ordered today and personally reviewed by me and demonstrates *** EKG was not ordered today.***  Recent Labs: 05/29/2015: B Natriuretic Peptide 90.4 08/08/2015: Hemoglobin 14.0; Platelets 215 11/04/2015: BUN 17; Creat 0.73; Potassium 4.1; Sodium 142 03/12/2016: Magnesium 1.8   Lipid Panel    Component Value Date/Time   CHOL 149 05/30/2015 0535   TRIG 94 05/30/2015 0535   HDL 35 (L) 05/30/2015 0535   CHOLHDL 4.3 05/30/2015 0535   VLDL 19 05/30/2015 0535   LDLCALC 95 05/30/2015 0535   LDLDIRECT 212.7 02/06/2011 1018    Additional studies/ records that were reviewed today include: Summarized above.***    ASSESSMENT & PLAN:   1. ***  Disposition: F/u with ***   Medication  Adjustments/Labs and Tests Ordered: Current medicines are reviewed  at length with the patient today.  Concerns regarding medicines are outlined above. Medication changes, Labs and Tests ordered today are summarized above and listed in the Patient Instructions accessible in Encounters.   Raechel Ache PA-C  04/21/2016 7:33 PM    [facility] [address], [place], [place]  [address] Phone: [phone]; Fax: [phone]

## 2016-04-23 ENCOUNTER — Ambulatory Visit: Payer: Medicare HMO | Admitting: Physician Assistant

## 2016-04-29 ENCOUNTER — Telehealth: Payer: Self-pay | Admitting: Physician Assistant

## 2016-04-29 ENCOUNTER — Other Ambulatory Visit (HOSPITAL_BASED_OUTPATIENT_CLINIC_OR_DEPARTMENT_OTHER): Payer: Medicare HMO

## 2016-04-29 ENCOUNTER — Ambulatory Visit (HOSPITAL_BASED_OUTPATIENT_CLINIC_OR_DEPARTMENT_OTHER): Payer: Medicare HMO | Admitting: Hematology and Oncology

## 2016-04-29 ENCOUNTER — Encounter: Payer: Self-pay | Admitting: Hematology and Oncology

## 2016-04-29 VITALS — BP 131/87 | Temp 97.6°F | Wt 173.5 lb

## 2016-04-29 DIAGNOSIS — Z780 Asymptomatic menopausal state: Secondary | ICD-10-CM

## 2016-04-29 DIAGNOSIS — L821 Other seborrheic keratosis: Secondary | ICD-10-CM | POA: Diagnosis not present

## 2016-04-29 DIAGNOSIS — C50312 Malignant neoplasm of lower-inner quadrant of left female breast: Secondary | ICD-10-CM

## 2016-04-29 DIAGNOSIS — Z170 Estrogen receptor positive status [ER+]: Secondary | ICD-10-CM

## 2016-04-29 DIAGNOSIS — C773 Secondary and unspecified malignant neoplasm of axilla and upper limb lymph nodes: Secondary | ICD-10-CM

## 2016-04-29 LAB — CBC WITH DIFFERENTIAL/PLATELET
BASO%: 1 % (ref 0.0–2.0)
Basophils Absolute: 0.1 10*3/uL (ref 0.0–0.1)
EOS%: 2.2 % (ref 0.0–7.0)
Eosinophils Absolute: 0.1 10*3/uL (ref 0.0–0.5)
HCT: 43.2 % (ref 34.8–46.6)
HGB: 14.4 g/dL (ref 11.6–15.9)
LYMPH%: 39.7 % (ref 14.0–49.7)
MCH: 29.9 pg (ref 25.1–34.0)
MCHC: 33.3 g/dL (ref 31.5–36.0)
MCV: 89.9 fL (ref 79.5–101.0)
MONO#: 0.6 10*3/uL (ref 0.1–0.9)
MONO%: 9.7 % (ref 0.0–14.0)
NEUT%: 47.4 % (ref 38.4–76.8)
NEUTROS ABS: 3.2 10*3/uL (ref 1.5–6.5)
PLATELETS: 227 10*3/uL (ref 145–400)
RBC: 4.81 10*6/uL (ref 3.70–5.45)
RDW: 14.1 % (ref 11.2–14.5)
WBC: 6.7 10*3/uL (ref 3.9–10.3)
lymph#: 2.6 10*3/uL (ref 0.9–3.3)

## 2016-04-29 LAB — COMPREHENSIVE METABOLIC PANEL
ALT: 15 U/L (ref 0–55)
ANION GAP: 7 meq/L (ref 3–11)
AST: 15 U/L (ref 5–34)
Albumin: 3.7 g/dL (ref 3.5–5.0)
Alkaline Phosphatase: 127 U/L (ref 40–150)
BILIRUBIN TOTAL: 0.53 mg/dL (ref 0.20–1.20)
BUN: 14.4 mg/dL (ref 7.0–26.0)
CO2: 31 meq/L — AB (ref 22–29)
CREATININE: 0.8 mg/dL (ref 0.6–1.1)
Calcium: 10.3 mg/dL (ref 8.4–10.4)
Chloride: 104 mEq/L (ref 98–109)
EGFR: 72 mL/min/{1.73_m2} — ABNORMAL LOW (ref >90)
GLUCOSE: 93 mg/dL (ref 70–140)
Potassium: 4.1 mEq/L (ref 3.5–5.1)
Sodium: 142 mEq/L (ref 136–145)
TOTAL PROTEIN: 7 g/dL (ref 6.4–8.3)

## 2016-04-29 MED ORDER — LETROZOLE 2.5 MG PO TABS: ORAL_TABLET | ORAL | 3 refills | Status: DC

## 2016-04-29 NOTE — Assessment & Plan Note (Signed)
Left breast invasive lobular cancer diagnosed 02/20/2011 status post left mastectomy with axillary lymph node dissection, 6 cm ILC with LVI, PNI, 9/16 lymph nodes positive with extracapsular extension, ER 100%, PR 100%, HER-2 -1.31, Ki-67 18%, T3a N2 a M0 stage IIIa status post F AC chemotherapy 4 followed by radiation and currently on letrozole 2.5 mg daily since 10/16/2011  Letrozole toxicities: 1. Hot flashes but not very severe 2. Occasional chest tightness  Breast cancer surveillance: 1. Mammogram 07/09/2015 indeterminate mass right breast at 11:00  2. Chest wall and breast exam 04/29/2016  3. Bone density 03/06/2013 showed a T score of -2.1 Boniva was not covered by her insurance and we switched her to Prolia since March 2016. Patient will need another bone density test  Right lumpectomy 08/09/2015: Fibrocystic changes no residual ADH, right nipple seborrheic keratosis Return to clinic in 1 year for follow-up

## 2016-04-29 NOTE — Telephone Encounter (Signed)
New Message     Pt returning call for results for halter monitor

## 2016-04-29 NOTE — Telephone Encounter (Signed)
Returned pts call and discussed her heart monitor results with her.  She is aware that they will go in further detail at her next o/v.  Pt verbalized understanding.

## 2016-04-29 NOTE — Telephone Encounter (Signed)
Follow Up:    She said she was talking to you and  She was disconnected.

## 2016-04-29 NOTE — Progress Notes (Signed)
Patient Care Team: Vicenta Aly, FNP as PCP - General (Nurse Practitioner) Neldon Mc, MD as Surgeon (General Surgery) Sueanne Margarita, MD as Consulting Physician (Cardiology)  DIAGNOSIS:  Encounter Diagnosis  Name Primary?  . Primary cancer of lower-inner quadrant of left female breast ([place])     SUMMARY OF ONCOLOGIC HISTORY:   Primary cancer of lower-inner quadrant of left female breast ([place])   02/20/2011 Initial Diagnosis    Left Breast invasive lobular carcinoma; 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 Her clinical staging was stage IIB (T3, N0, M0).        03/23/2011 Surgery    L mastectomy + Axillary LND 6 cm ILC with LVI, PNI; 9/16 LN positive with Extracapsular ext ER 100%; PR 100%; Her 2 1.31; Ki 67: 18; pT3a N2a (stage IIIA)      04/17/2011 - 06/19/2011 Chemotherapy    FEC X 4 complicated by febrile neutropenis, spesis and chest wall abscess      10/07/2011 - 11/24/2011 Radiation Therapy    Left chest wall and axilla 5040 cGy in 28 # with Boost      10/16/2011 -  Anti-estrogen oral therapy    Letrozole 2.5 mg po daily      05/27/2015 - 05/30/2015 Hospital Admission    Acute systolic CHF, dilated cardiomyopathy, EF 35-40%      08/09/2015 Surgery    Right lumpectomy: Fibrocystic changes with calcifications, right nipple lesion: Seborrheic keratosis no evidence of malignancy       CHIEF COMPLIANT:   Follow up of breast cancer on letrozole therapy  INTERVAL HISTORY: [name] is a  76 year old with abovementioned history of  Left breast cancer. She is here for annual surveillance checkup. She denies any pain or lumps or nodules in the breast. In July 2017 she had an abnormality in the right breast which was evaluated by biopsies and finally a lumpectomy was performed. There was no evidence of malignancy. She is tolerating the antiestrogen treatment fairly well. She would've finished 5 years of therapy as of September  2018. However I'm recommending that she remain on antiestrogen therapy for at least 7 years.  REVIEW OF SYSTEMS:   Constitutional: Denies fevers, chills or abnormal weight loss Eyes: Denies blurriness of vision Ears, nose, mouth, throat, and face: Denies mucositis or sore throat Respiratory: Denies cough, dyspnea or wheezes Cardiovascular: Denies palpitation, chest discomfort Gastrointestinal:  Denies nausea, heartburn or change in bowel habits Skin: Denies abnormal skin rashes Lymphatics: Denies new lymphadenopathy or easy bruising Neurological:Denies numbness, tingling or new weaknesses Behavioral/Psych: Mood is stable, no new changes  Extremities: No lower extremity edema Breast: Left mastectomy  All other systems were reviewed with the patient and are negative.  I have reviewed the past medical history, past surgical history, social history and family history with the patient and they are unchanged from previous note.  ALLERGIES:  is allergic to oxycodone-acetaminophen; cephalexin; clindamycin; dilaudid [hydromorphone hcl]; oxycodone-acetaminophen; rocephin [ceftriaxone sodium in dextrose]; and sulfonamide derivatives.  MEDICATIONS:  Current Outpatient Prescriptions  Medication Sig Dispense Refill  . acyclovir (ZOVIRAX) 200 MG capsule TAKE 2 CAPSULES BY MOUTH 3 TIMES DAILY 540 capsule 3  . albuterol (PROVENTIL) (2.5 MG/3ML) 0.083% nebulizer solution Take 2.5 mg by nebulization every 6 (six) hours as needed for shortness of breath.     . ALPRAZolam (XANAX) 0.5 MG tablet TAKE ONE-HALF TO ONE TABLET BY MOUTH THREE TIMES DAILY AS NEEDED FOR  NERVES 270 tablet 1  . amLODipine (NORVASC) 5  MG tablet TAKE 1 TABLET (5 MG TOTAL) BY MOUTH DAILY. 90 tablet 3  . aspirin EC 325 MG tablet Take 325 mg by mouth daily.    Marland Kitchen atorvastatin (LIPITOR) 40 MG tablet Take 40 mg by mouth daily.    . carvedilol (COREG) 6.25 MG tablet TAKE 1 TABLET TWICE DAILY WITH A MEAL 180 tablet 3  . cholecalciferol  (VITAMIN D) 1000 UNITS tablet Take 1 tablet (1,000 Units total) by mouth daily. 90 tablet 3  . cyanocobalamin 500 MCG tablet Take 1 tablet (500 mcg total) by mouth daily. 90 tablet 3  . Insulin Glargine (LANTUS) 100 UNIT/ML Solostar Pen Inject 30 Units into the skin at bedtime. 60 mL 3  . Insulin Pen Needle (RELION PEN NEEDLE 31G/8MM) 31G X 8 MM MISC 1 Device by Does not apply route 4 (four) times daily. 120 each 11  . letrozole (FEMARA) 2.5 MG tablet TAKE ONE TABLET BY MOUTH DAILY. 90 tablet 12  . Liraglutide (VICTOZA) 18 MG/3ML SOPN Inject 3 mLs (18 mg total) into the skin every morning. 6 mL   . losartan (COZAAR) 100 MG tablet Take 1 tablet (100 mg total) by mouth daily. 90 tablet 3  . magnesium oxide (MAG-OX) 400 MG tablet Take 1 tablet (400 mg total) by mouth daily. 30 tablet 1  . Multiple Vitamins-Minerals (ONE-A-DAY WOMENS 50+ ADVANTAGE) TABS Take 1 tablet by mouth daily. 90 tablet 3  . omeprazole (PRILOSEC) 40 MG capsule Take 40 mg by mouth daily.    . sertraline (ZOLOFT) 50 MG tablet Take 1 tablet (50 mg total) by mouth daily. 90 tablet 3  . spironolactone (ALDACTONE) 25 MG tablet Take 1 tablet (25 mg total) by mouth daily. 30 tablet 11   No current facility-administered medications for this visit.     PHYSICAL EXAMINATION: ECOG PERFORMANCE STATUS: 1 - Symptomatic but completely ambulatory  Vitals:   04/29/16 1449  BP: 131/87  Temp: 97.6 F (36.4 C)   Filed Weights   04/29/16 1449  Weight: 173 lb 8 oz (78.7 kg)    GENERAL:alert, no distress and comfortable SKIN: skin color, texture, turgor are normal, no rashes or significant lesions EYES: normal, Conjunctiva are pink and non-injected, sclera clear OROPHARYNX:no exudate, no erythema and lips, buccal mucosa, and tongue normal  NECK: supple, thyroid normal size, non-tender, without nodularity LYMPH:  no palpable lymphadenopathy in the cervical, axillary or inguinal LUNGS: clear to auscultation and percussion with normal  breathing effort HEART: regular rate & rhythm and no murmurs and no lower extremity edema ABDOMEN:abdomen soft, non-tender and normal bowel sounds MUSCULOSKELETAL:no cyanosis of digits and no clubbing  NEURO: alert & oriented x 3 with fluent speech, no focal motor/sensory deficits EXTREMITIES: No lower extremity edema BREAST:Left mastectomy and right breasts no palpable lumps or nodules. (exam performed in the presence of a chaperone)  LABORATORY DATA:  I have reviewed the data as listed   Chemistry      Component Value Date/Time   NA 142 11/04/2015 1220   NA 143 04/18/2015 1316   K 4.1 11/04/2015 1220   K 3.5 04/18/2015 1316   CL 104 11/04/2015 1220   CL 102 02/25/2012 0908   CO2 29 11/04/2015 1220   CO2 30 (H) 04/18/2015 1316   BUN 17 11/04/2015 1220   BUN 14.6 04/18/2015 1316   CREATININE 0.73 11/04/2015 1220   CREATININE 0.8 04/18/2015 1316      Component Value Date/Time   CALCIUM 10.0 11/04/2015 1220   CALCIUM 9.8 04/18/2015  1316   ALKPHOS 80 04/18/2015 1316   AST 13 04/18/2015 1316   ALT 14 04/18/2015 1316   BILITOT 0.39 04/18/2015 1316       Lab Results  Component Value Date   WBC 6.7 04/29/2016   HGB 14.4 04/29/2016   HCT 43.2 04/29/2016   MCV 89.9 04/29/2016   PLT 227 04/29/2016   NEUTROABS 3.2 04/29/2016    ASSESSMENT & PLAN:  Primary cancer of lower-inner quadrant of left female breast ([place]) Left breast invasive lobular cancer diagnosed 02/20/2011 status post left mastectomy with axillary lymph node dissection, 6 cm ILC with LVI, PNI, 9/16 lymph nodes positive with extracapsular extension, ER 100%, PR 100%, HER-2 -1.31, Ki-67 18%, T3a N2 a M0 stage IIIa status post F AC chemotherapy 4 followed by radiation and currently on letrozole 2.5 mg daily since 10/16/2011  Letrozole toxicities: 1. Hot flashes but not very severe 2. Occasional chest tightness  Breast cancer surveillance: 1. Mammogram 07/09/2015 indeterminate mass right breast at 11:00  2.  Chest wall and breast exam 04/29/2016  3. Bone density 03/06/2013 showed a T score of -2.1 Boniva was not covered by her insurance She received Prolia previously last given September 2016. Patient will need another bone density test to determine if she needs to resume Prolia.  Right lumpectomy 08/09/2015: Fibrocystic changes no residual ADH, right nipple seborrheic keratosis Return to clinic in 1 year for follow-up   I spent 25 minutes talking to the patient of which more than half was spent in counseling and coordination of care.  No orders of the defined types were placed in this encounter.  The patient has a good understanding of the overall plan. she agrees with it. she will call with any problems that may develop before the next visit here.   Rulon Eisenmenger, MD 04/29/16

## 2016-05-28 ENCOUNTER — Ambulatory Visit: Payer: Medicare HMO | Admitting: Physician Assistant

## 2016-05-28 DIAGNOSIS — I493 Ventricular premature depolarization: Secondary | ICD-10-CM | POA: Insufficient documentation

## 2016-05-28 DIAGNOSIS — I428 Other cardiomyopathies: Secondary | ICD-10-CM | POA: Insufficient documentation

## 2016-05-28 NOTE — Progress Notes (Deleted)
Cardiology Office Note    Date:  05/28/2016  ID:  PHENIX GREIN, DOB 01-Jul-1940, MRN [id number] PCP:  Vicenta Aly, FNP  Cardiologist:  Dr. Radford Pax   Chief Complaint: f/u palpitations  History of Present Illness:  [name] is a 76 y.o. female with history of HTN, HLD, DM, previous breast cancer, NICM/chronic combined CHF, anxiety, asthma, baseline tremor who is here to review cardiac resting. She was diagnosed with CHF in 2017 at which time nuc was abnormal and 2D echo 05/28/15 showed mild LVH, EF 35-40%, grade 1 DD, moderate focal AV calcification. LHC 05/29/15 showed normal coronaries, EF 30-40%, normal LVEDP, etiology of CM uncertain but could be related to late effect of chemo 2/2 breast CA. She was seen by primary care 02/2016 at which time she was describing "fluid in ears causing dizziness" as well as palpitations and fatigue. EKG was misinterpreted as atrial fib but actually demonstrated NSR with sinus arrhythmia versus PACs with baseline artifact related to tremor. Comprehensive labs showed A1C 6.0, glucose 107, BUN 19, Cr 0.78, Na 143, K 4.0, Cl 98, CO2 25, albumin 4.1, LFTs wnl except alk phos 128, Hgb 14.7, WBC 7.9, Hct 42.6, Plt 283, TSH 1.670, BNP 55. When I saw her in clinic she did have mild drop in BP from 122/74 to 109/74 but dizziness was largely felt vertiginous (room spinning while changing head position). Her carvedilol was increased to BID (had been only taking it once a day; amlodipine was stopped to acommodate this change). We ordered an event monitor which showed NSR with average HR 71, frequent PACs, occasional PVCs, and sinus bradycardia as low as 50. Mg level was 1.8, prompting rx of MagOx '400mg'$  daily.    Palpitations c/w PACs  PVCs  Chronic combined CHF  NICM  Essential HTN       Past Medical History:  Diagnosis Date  . ALLERGIC RHINITIS 12/13/2007  . ANXIETY 06/04/2008  . Asthma   . Blood transfusion 1964   post childbirth  . 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 Chronic combined systolic (congestive) and diastolic (congestive) heart failure   . DEGENERATIVE JOINT DISEASE 12/17/2006  . DEPRESSION 08/10/2007  . Dilated cardiomyopathy ([place]) 05/29/2015   a. Dx 05/2015 - EF 35-40% - felt secondary to XRT and chemo for breast CA. Normal coronaries by cath.  . DM type 2 (diabetes mellitus, type 2) ([place])   . Essential hypertension   . GERD 12/17/2006  . History of chemotherapy   . History of radiation therapy 10/07/11-11/24/11   left breast,5040 cGy/28 sessions,l chest wall boost=1000cGy/5 sesions  . 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 Hyperlipidemia   . OBESITY 06/04/2008  . 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 Premature atrial contractions   . Sinus bradycardia    a. 2018 event monitor which showed NSR with average HR 71, frequent PACs, occasional PVCs, and sinus bradycardia as low as 50.    Past Surgical History:  Procedure Laterality Date  . bladder tack  1974  . BREAST EXCISIONAL BIOPSY  11/10/1988   left benign Dr Margot Chimes  . BREAST EXCISIONAL BIOPSY  03/04/1983   Right - two areas - Dr Margot Chimes  . BREAST EXCISIONAL BIOPSY  10/09/1980   right - Dr Margot Chimes  . BREAST EXCISIONAL BIOPSY  10/18/1979   left - Dr Margot Chimes  . BREAST EXCISIONAL BIOPSY  01/02/1994   right - Dr Margot Chimes  . BREAST  SURGERY    . CARDIAC CATHETERIZATION N/A 05/29/2015   Procedure: Left Heart Cath and Coronary Angiography;  Surgeon: Belva Crome, MD;  Location: [facility];  Service: Cardiovascular;  Laterality: N/A;  . CHOLECYSTECTOMY  02/23/1991   LC - Dr Margot Chimes  . COLONOSCOPY    . ENTEROCELE REPAIR  06/08   Dr. Cletis Media  . EVACUATION BREAST HEMATOMA  07/01/2011   Procedure: EVACUATION HEMATOMA BREAST;  Surgeon: Haywood Lasso, MD;  Location: [facility];  Service: General;  Laterality: Left;  Drainage of left mastectomy seroma  . HIP SURGERY  08/27/2010   DR. Maureen Ralphs  . LAMINECTOMY  1993   s/p-Dr. Rita Ohara  .  MASTECTOMY Left 03/23/2011  . MODIFIED RADICAL MASTECTOMY W/ AXILLARY LYMPH NODE DISSECTION Left 03-30-11  . PORT-A-CATH REMOVAL  12/02/2011   Procedure: REMOVAL PORT-A-CATH;  Surgeon: Haywood Lasso, MD;  Location: [facility];  Service: General;  Laterality: N/A;  . PORTACATH PLACEMENT  04/13/2011   Procedure: INSERTION PORT-A-CATH;  Surgeon: Haywood Lasso, MD;  Location: [facility];  Service: General;  Laterality: N/A;  porta cath placement,removal of J-P drain  . RADIOACTIVE SEED GUIDED EXCISIONAL BREAST BIOPSY Right 08/09/2015   Procedure: RIGHT RADIOACTIVE SEED GUIDED EXCISIONAL BREAST BIOPSY, RIGHT NIPPLE LESION EXCISION;  Surgeon: Rolm Bookbinder, MD;  Location: [facility];  Service: General;  Laterality: Right;  . ROTATOR CUFF REPAIR  8/03   right, Dr. Tonita Cong  . ROTATOR CUFF REPAIR  10/26/2008   left, Dr. Rush Farmer  . TONSILLECTOMY    . VAGINAL HYSTERECTOMY  1974   partial     Current Medications: Current Outpatient Prescriptions  Medication Sig Dispense Refill  . acyclovir (ZOVIRAX) 200 MG capsule TAKE 2 CAPSULES BY MOUTH 3 TIMES DAILY 540 capsule 3  . albuterol (PROVENTIL) (2.5 MG/3ML) 0.083% nebulizer solution Take 2.5 mg by nebulization every 6 (six) hours as needed for shortness of breath.     . ALPRAZolam (XANAX) 0.5 MG tablet TAKE ONE-HALF TO ONE TABLET BY MOUTH THREE TIMES DAILY AS NEEDED FOR  NERVES 270 tablet 1  . amLODipine (NORVASC) 5 MG tablet TAKE 1 TABLET (5 MG TOTAL) BY MOUTH DAILY. 90 tablet 3  . aspirin EC 325 MG tablet Take 325 mg by mouth daily.    Marland Kitchen atorvastatin (LIPITOR) 40 MG tablet Take 40 mg by mouth daily.    . carvedilol (COREG) 6.25 MG tablet TAKE 1 TABLET TWICE DAILY WITH A MEAL 180 tablet 3  . cholecalciferol (VITAMIN D) 1000 UNITS tablet Take 1 tablet (1,000 Units total) by mouth daily. 90 tablet 3  . cyanocobalamin 500 MCG tablet Take 1 tablet (500 mcg total) by mouth daily. 90 tablet 3  . Insulin  Glargine (LANTUS) 100 UNIT/ML Solostar Pen Inject 30 Units into the skin at bedtime. 60 mL 3  . Insulin Pen Needle (RELION PEN NEEDLE 31G/8MM) 31G X 8 MM MISC 1 Device by Does not apply route 4 (four) times daily. 120 each 11  . letrozole (FEMARA) 2.5 MG tablet TAKE ONE TABLET BY MOUTH DAILY. 90 tablet 3  . Liraglutide (VICTOZA) 18 MG/3ML SOPN Inject 3 mLs (18 mg total) into the skin every morning. 6 mL   . losartan (COZAAR) 100 MG tablet Take 1 tablet (100 mg total) by mouth daily. 90 tablet 3  . magnesium oxide (MAG-OX) 400 MG tablet Take 1 tablet (400 mg total) by mouth daily. 30 tablet 1  . Multiple Vitamins-Minerals (ONE-A-DAY WOMENS 50+ ADVANTAGE)  TABS Take 1 tablet by mouth daily. 90 tablet 3  . omeprazole (PRILOSEC) 40 MG capsule Take 40 mg by mouth daily.    . sertraline (ZOLOFT) 50 MG tablet Take 1 tablet (50 mg total) by mouth daily. 90 tablet 3  . spironolactone (ALDACTONE) 25 MG tablet Take 1 tablet (25 mg total) by mouth daily. 30 tablet 11   No current facility-administered medications for this visit.      Allergies:   Oxycodone-acetaminophen; Cephalexin; Clindamycin; Dilaudid [hydromorphone hcl]; Oxycodone-acetaminophen; Rocephin [ceftriaxone sodium in dextrose]; and Sulfonamide derivatives   Social History   Social History  . Marital status: Married    Spouse name: N/A  . Number of children: 3  . Years of education: N/A   Occupational History  .  Retired   Social History Main Topics  . Smoking status: Never Smoker  . Smokeless tobacco: Never Used  . Alcohol use No  . Drug use: No  . Sexual activity: Yes   Other Topics Concern  . 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 COPD Mother   . Arthritis Mother   . Emphysema Mother    . Diabetes Brother   . Cancer Brother     prostate  . Cancer Father     malignant brain tumor  . Mental illness Paternal Grandfather   . Heart disease Other     Sibling  . Diabetes Other     Sibling    ***  ROS:   Please see the history of present illness. Otherwise, review of systems is positive for ***.  All other systems are reviewed and otherwise negative.    PHYSICAL EXAM:   VS:  There were no vitals taken for this visit.  BMI: There is no height or weight on file to calculate BMI. GEN: Well nourished, well developed, in no acute distress  HEENT: normocephalic, atraumatic Neck: no JVD, carotid bruits, or masses Cardiac: ***RRR; no murmurs, rubs, or gallops, no edema  Respiratory:  clear to auscultation bilaterally, normal work of breathing GI: soft, nontender, nondistended, + BS MS: no deformity or atrophy  Skin: warm and dry, no rash Neuro:  Alert and Oriented x 3, Strength and sensation are intact, follows commands Psych: euthymic mood, full affect  Wt Readings from Last 3 Encounters:  04/29/16 173 lb 8 oz (78.7 kg)  03/12/16 175 lb (79.4 kg)  11/04/15 183 lb (83 kg)      Studies/Labs Reviewed:   EKG:  EKG was ordered today and personally reviewed by me and demonstrates *** EKG was not ordered today.***  Recent Labs: 05/29/2015: B Natriuretic Peptide 90.4 03/12/2016: Magnesium 1.8 04/29/2016: ALT 15; BUN 14.4; Creatinine 0.8; HGB 14.4; Platelets 227; Potassium 4.1; Sodium 142   Lipid Panel    Component Value Date/Time   CHOL 149 05/30/2015 0535   TRIG 94 05/30/2015 0535   HDL 35 (L) 05/30/2015 0535   CHOLHDL 4.3 05/30/2015 0535   VLDL 19 05/30/2015 0535   LDLCALC 95 05/30/2015 0535   LDLDIRECT 212.7 02/06/2011 1018    Additional studies/ records that were reviewed today include: Summarized above.***    ASSESSMENT & PLAN:   1. ***  Disposition: F/u with ***   Medication Adjustments/Labs and Tests Ordered: Current medicines are reviewed at  length with the patient today.  Concerns regarding medicines are outlined above. Medication changes, Labs and Tests ordered today are summarized above and listed in the Patient Instructions accessible in Encounters.   Raechel Ache PA-C  05/28/2016 7:57 AM    [address], [place], [place]  [address] Phone: [phone]; Fax: [phone]

## 2016-05-29 ENCOUNTER — Encounter: Payer: Self-pay | Admitting: Physician Assistant

## 2016-06-04 ENCOUNTER — Other Ambulatory Visit: Payer: Self-pay | Admitting: Physician Assistant

## 2016-06-04 NOTE — Telephone Encounter (Signed)
magnesium oxide (MAG-OX) 400 MG tablet [[id number]] DISCONTINUED  Order Details  Dose: 400 mg Route: Oral Frequency: Daily  Dispense Quantity:  90 tablet Refills:  3 Fills remaining:  --        Sig: Take 1 tablet (400 mg total) by mouth daily.       Discontinue Date:  03/30/2016 1245 Discontinue User:  Barkley Boards, RN Discontinue Reason:  Reorder  Written Date:  03/13/16 Expiration Date:  03/13/17    Start Date:  03/13/16 End Date:  03/30/16         Ordering Provider:  Charlie Pitter, PA-C DEA #:  [id number] NPI:  [id number]   Authorizing Provider:  Charlie Pitter, PA-C DEA #:  [id number] NPI:  [id number]   Ordering User:  Jeanann Lewandowsky, RMA            Original Order:  magnesium oxide (MAG-OX) 400 MG tablet [[id number]]    Pharmacy:  [facility], [place] DEA #:  --    Pharmacy Comments:  --

## 2016-06-11 ENCOUNTER — Other Ambulatory Visit: Payer: Self-pay | Admitting: *Deleted

## 2016-06-11 MED ORDER — MAGNESIUM OXIDE 400 MG PO TABS: 400.0000 mg | ORAL_TABLET | Freq: Every day | ORAL | 9 refills | Status: DC

## 2016-07-31 ENCOUNTER — Other Ambulatory Visit: Payer: Self-pay | Admitting: Physician Assistant

## 2016-07-31 ENCOUNTER — Other Ambulatory Visit: Payer: Self-pay | Admitting: Cardiology

## 2016-07-31 NOTE — Telephone Encounter (Signed)
Medication Detail    Disp Refills Start End   magnesium oxide (MAG-OX) 400 MG tablet 30 tablet 9 06/11/2016    Sig - Route: Take 1 tablet (400 mg total) by mouth daily. - Oral   E-Prescribing Status: Receipt confirmed by pharmacy (06/11/2016 4:43 PM EDT)   Pharmacy   [id number] - [place], [place] - [address]

## 2016-09-15 NOTE — Progress Notes (Signed)
Cardiology Office Note    Date:  09/16/2016  ID:  [name], DOB 1941/01/09, MRN [id number] PCP:  Vicenta Aly, [place]  Cardiologist:  Dr. Radford Pax   Chief Complaint: f/u event monitor  History of Present Illness:  [name] is a 76 y.o. female with history of HTN, HLD, DM, previous breast cancer, NICM/chronic combined CHF, anxiety, asthma, baseline tremor who is here to review cardiac resting. She was diagnosed with CHF in 2017 at which time nuc was abnormal and 2D echo 05/28/15 showed mild LVH, EF 35-40%, grade 1 DD, moderate focal AV calcification. LHC 05/29/15 showed normal coronaries, EF 30-40%, normal LVEDP, etiology of CM uncertain but could be related to late effect of chemo 2/2 breast CA. She was seen by primary care 02/2016 at which time she was describing "fluid in ears causing dizziness" as well as palpitations and fatigue. EKG was misinterpreted as atrial fib but actually demonstrated NSR with sinus arrhythmia versus PACs with baseline artifact related to tremor. Comprehensive labs showed A1C 6.0, glucose 107, BUN 19, Cr 0.78, Na 143, K 4.0, Cl 98, CO2 25, albumin 4.1, LFTs wnl except alk phos 128, Hgb 14.7, WBC 7.9, Hct 42.6, Plt 283, TSH 1.670, BNP 55. When I saw her in clinic she did have mild drop in BP from 122/74 to 109/74 but dizziness was largely felt vertiginous (room spinning while changing head position). Her carvedilol was increased to BID (had been only taking it once a day; amlodipine was stopped to acommodate this change). We ordered an event monitor which showed NSR with average HR 71, frequent PACs, occasional PVCs, and sinus bradycardia as low as 50. Mg level was 1.8, prompting rx of MagOx '400mg'$  daily.   She comes back in today overall doing well. She has not had any chest pain. She reports her dyspnea is actually much better than when we previously met. She is now able to walk to the whole length of the pier at [facility] without difficulty. She enjoys  fishing. No LEE, orthopnea, PND. Weight is down a few lbs. Reports rare palpitations, not sustained.   Past Medical History:  Diagnosis Date  . ALLERGIC RHINITIS 12/13/2007  . ANXIETY 06/04/2008  . Asthma   . Blood transfusion 1964   post childbirth  . 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 Chronic combined systolic (congestive) and diastolic (congestive) heart failure ([place])   . DEGENERATIVE JOINT DISEASE 12/17/2006  . DEPRESSION 08/10/2007  . Dilated cardiomyopathy ([place]) 05/29/2015   a. Dx 05/2015 - EF 35-40% - felt secondary to XRT and chemo for breast CA. Normal coronaries by cath.  . DM type 2 (diabetes mellitus, type 2) ([place])   . Essential hypertension   . GERD 12/17/2006  . History of chemotherapy   . History of radiation therapy 10/07/11-11/24/11   left breast,5040 cGy/28 sessions,l chest wall boost=1000cGy/5 sesions  . 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 Hyperlipidemia   . OBESITY 06/04/2008  . 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 Premature atrial contractions   . Sinus bradycardia    a. 2018 event monitor which showed NSR with average HR 71, frequent PACs, occasional PVCs, and sinus bradycardia as low as 50.    Past Surgical History:  Procedure Laterality Date  . bladder tack  1974  . BREAST EXCISIONAL BIOPSY  11/10/1988   left benign Dr Margot Chimes  . BREAST EXCISIONAL BIOPSY  03/04/1983   Right -  two areas - Dr Margot Chimes  . BREAST EXCISIONAL BIOPSY  10/09/1980   right - Dr Margot Chimes  . BREAST EXCISIONAL BIOPSY  10/18/1979   left - Dr Margot Chimes  . BREAST EXCISIONAL BIOPSY  01/02/1994   right - Dr Margot Chimes  . BREAST SURGERY    . CARDIAC CATHETERIZATION N/A 05/29/2015   Procedure: Left Heart Cath and Coronary Angiography;  Surgeon: Belva Crome, MD;  Location: [facility];  Service: Cardiovascular;  Laterality: N/A;  . CHOLECYSTECTOMY  02/23/1991   LC - Dr Margot Chimes  . COLONOSCOPY    . ENTEROCELE REPAIR  06/08   Dr. Cletis Media  . EVACUATION  BREAST HEMATOMA  07/01/2011   Procedure: EVACUATION HEMATOMA BREAST;  Surgeon: Haywood Lasso, MD;  [facility];  Service: General;  Laterality: Left;  Drainage of left mastectomy seroma  . HIP SURGERY  08/27/2010   DR. Maureen Ralphs  . LAMINECTOMY  1993   s/p-Dr. Rita Ohara  . MASTECTOMY Left 03/23/2011  . MODIFIED RADICAL MASTECTOMY W/ AXILLARY LYMPH NODE DISSECTION Left 03-30-11  . PORT-A-CATH REMOVAL  12/02/2011   Procedure: REMOVAL PORT-A-CATH;  Surgeon: Haywood Lasso, MD;  Location: [facility];  Service: General;  Laterality: N/A;  . PORTACATH PLACEMENT  04/13/2011   Procedure: INSERTION PORT-A-CATH;  Surgeon: Haywood Lasso, MD;  Location: [facility];  Service: General;  Laterality: N/A;  porta cath placement,removal of J-P drain  . RADIOACTIVE SEED GUIDED EXCISIONAL BREAST BIOPSY Right 08/09/2015   Procedure: RIGHT RADIOACTIVE SEED GUIDED EXCISIONAL BREAST BIOPSY, RIGHT NIPPLE LESION EXCISION;  Surgeon: Rolm Bookbinder, MD;  Location: [facility];  Service: General;  Laterality: Right;  . ROTATOR CUFF REPAIR  8/03   right, Dr. Tonita Cong  . ROTATOR CUFF REPAIR  10/26/2008   left, Dr. Rush Farmer  . TONSILLECTOMY    . VAGINAL HYSTERECTOMY  1974   partial     Current Medications: Current Meds  Medication Sig  . acyclovir (ZOVIRAX) 200 MG capsule TAKE 2 CAPSULES BY MOUTH 3 TIMES DAILY  . albuterol (PROVENTIL) (2.5 MG/3ML) 0.083% nebulizer solution Take 2.5 mg by nebulization every 6 (six) hours as needed for shortness of breath.   . ALPRAZolam (XANAX) 0.5 MG tablet TAKE ONE-HALF TO ONE TABLET BY MOUTH THREE TIMES DAILY AS NEEDED FOR  NERVES  . aspirin EC 325 MG tablet Take 325 mg by mouth daily.  Marland Kitchen atorvastatin (LIPITOR) 40 MG tablet Take 40 mg by mouth daily.  . carvedilol (COREG) 6.25 MG tablet TAKE 1 TABLET TWICE DAILY WITH A MEAL  . cholecalciferol (VITAMIN D) 1000 UNITS tablet Take 1 tablet (1,000 Units total) by mouth daily.  .  cyanocobalamin 500 MCG tablet Take 1 tablet (500 mcg total) by mouth daily.  . insulin glargine (LANTUS) 100 UNIT/ML injection Inject 15 Units into the skin at bedtime.  . Insulin Pen Needle (RELION PEN NEEDLE 31G/8MM) 31G X 8 MM MISC 1 Device by Does not apply route 4 (four) times daily.  Marland Kitchen letrozole (FEMARA) 2.5 MG tablet TAKE ONE TABLET BY MOUTH DAILY.  Marland Kitchen Liraglutide (VICTOZA) 18 MG/3ML SOPN Inject 3 mLs (18 mg total) into the skin every morning.  Marland Kitchen losartan (COZAAR) 100 MG tablet Take 1 tablet (100 mg total) by mouth daily.  . magnesium oxide (MAG-OX) 400 MG tablet Take 1 tablet (400 mg total) by mouth daily.  . Multiple Vitamins-Minerals (ONE-A-DAY WOMENS 50+ ADVANTAGE) TABS Take 1 tablet by mouth daily.  Marland Kitchen omeprazole (PRILOSEC) 40 MG capsule Take  40 mg by mouth daily.  . sertraline (ZOLOFT) 50 MG tablet Take 1 tablet (50 mg total) by mouth daily.  Marland Kitchen spironolactone (ALDACTONE) 25 MG tablet TAKE 1 TABLET BY MOUTH EVERY DAY  . tolterodine (DETROL LA) 4 MG 24 hr capsule Take 1 capsule by mouth daily.  . [DISCONTINUED] liraglutide (VICTOZA) 18 MG/3ML SOPN Inject 18 mg into the skin daily.     Allergies:   Oxycodone-acetaminophen; Cephalexin; Clindamycin; Dilaudid [hydromorphone hcl]; Oxycodone-acetaminophen; Rocephin [ceftriaxone sodium in dextrose]; and Sulfonamide derivatives   Social History   Social History  . Marital status: Married    Spouse name: N/A  . Number of children: 3  . Years of education: N/A   Occupational History  .  Retired   Social History Main Topics  . Smoking status: Never Smoker  . Smokeless tobacco: Never Used  . Alcohol use No  . Drug use: No  . Sexual activity: Yes   Other Topics Concern  . 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 COPD Mother   . Arthritis Mother   . Emphysema Mother   . Diabetes Brother   . Cancer Brother        prostate  . Cancer Father        malignant brain tumor  . Mental illness Paternal Grandfather   . Heart disease Other        Sibling  . Diabetes Other        Sibling     ROS:   Please see the history of present illness. All other systems are reviewed and otherwise negative.    PHYSICAL EXAM:   VS:  BP 126/68   Pulse 84   Ht '5\' 8"'$  (1.727 m)   Wt 170 lb (77.1 kg)   SpO2 93%   BMI 25.85 kg/m   BMI: Body mass index is 25.85 kg/m. GEN: Well nourished, well developed WF, in no acute distress  HEENT: normocephalic, atraumatic Neck: no JVD, carotid bruits, or masses Cardiac:RRR; no murmurs, rubs, or gallops, no edema  Respiratory:  clear to auscultation bilaterally, normal work of breathing GI: soft, nontender, nondistended, + BS MS: no deformity or atrophy  Skin: warm and dry, no rash Neuro:  Alert and Oriented x 3, Strength and sensation are intact, follows commands Psych: euthymic mood, full affect  Wt Readings from Last 3 Encounters:  09/16/16 170 lb (77.1 kg)  04/29/16 173 lb 8 oz (78.7 kg)  03/12/16 175 lb (79.4 kg)      Studies/Labs Reviewed:   EKG:  EKG was ordered today and personally reviewed by me and demonstrates NSR/sinus arrhythmia, 84bpm, occasional PVC, nonspecific ST-T changes similar to prior.  Recent Labs: 03/12/2016: Magnesium 1.8 04/29/2016: ALT 15; BUN 14.4; Creatinine 0.8; HGB 14.4; Platelets 227; Potassium 4.1; Sodium 142   Lipid Panel    Component Value Date/Time   CHOL 149 05/30/2015 0535   TRIG 94 05/30/2015 0535   HDL 35 (L) 05/30/2015 0535   CHOLHDL 4.3 05/30/2015 0535   VLDL 19 05/30/2015 0535   LDLCALC 95 05/30/2015 0535   LDLDIRECT 212.7 02/06/2011 1018    Additional studies/ records that were reviewed today include: Summarized above.  ASSESSMENT & PLAN:   1. Palpitations c/w PACs, also with PVCs - no  sustained arrhythmias by event monitor. Continue beta blocker at present dose. 2. Chronic combined CHF/NICM - doing well. Dyspnea improved with d/c of amlodipine and titration of carvedilol. Continue present doses. Discussed 2g sodium diet, 2L fluid restriction, observation for recurrent sx of CHF. As she is symptomatically feeling better, will defer repeat echo for now. With her chronic dizziness, not clear that she would tolerate much more by way of med adjustment even if EF remained lower. She has been maintained on aspirin dose of '325mg'$  daily traditionally - will send note to Dr. Radford Pax inquiring if we can reduce the dose to '81mg'$  daily. She's not had any bleeding. CBC was normal.  3. Essential HTN - controlled. Minimal sx of orthostasis, controlled by standing up slowly and then moving about.  Disposition: F/u with Dr. Radford Pax in 6 months.   Medication Adjustments/Labs and Tests Ordered: Current medicines are reviewed at length with the patient today.  Concerns regarding medicines are outlined above. Medication changes, Labs and Tests ordered today are summarized above and listed in the Patient Instructions accessible in Encounters.   Signed, Charlie Pitter, PA-C  09/16/2016 11:22 AM    [facility] [address], [place],   [address] Phone: [phone]; Fax: [phone]

## 2016-09-16 ENCOUNTER — Encounter: Payer: Self-pay | Admitting: Physician Assistant

## 2016-09-16 ENCOUNTER — Ambulatory Visit (INDEPENDENT_AMBULATORY_CARE_PROVIDER_SITE_OTHER): Payer: Medicare HMO | Admitting: Physician Assistant

## 2016-09-16 VITALS — BP 126/68 | HR 84 | Ht 68.0 in | Wt 170.0 lb

## 2016-09-16 DIAGNOSIS — I5042 Chronic combined systolic (congestive) and diastolic (congestive) heart failure: Secondary | ICD-10-CM | POA: Diagnosis not present

## 2016-09-16 DIAGNOSIS — I428 Other cardiomyopathies: Secondary | ICD-10-CM | POA: Diagnosis not present

## 2016-09-16 DIAGNOSIS — I10 Essential (primary) hypertension: Secondary | ICD-10-CM | POA: Diagnosis not present

## 2016-09-16 DIAGNOSIS — I491 Atrial premature depolarization: Secondary | ICD-10-CM | POA: Diagnosis not present

## 2016-09-16 DIAGNOSIS — I493 Ventricular premature depolarization: Secondary | ICD-10-CM

## 2016-09-16 NOTE — Patient Instructions (Signed)
Medication Instructions:  Your physician recommends that you continue on your current medications as directed. Please refer to the Current Medication list given to you today.   Labwork: None Ordered   Testing/Procedures: None Ordered   Follow-Up: Your physician wants you to follow-up in: 6 months with Dr. Turner. You will receive a reminder letter in the mail two months in advance. If you don't receive a letter, please call our office to schedule the follow-up appointment.   Any Other Special Instructions Will Be Listed Below (If Applicable).     If you need a refill on your cardiac medications before your next appointment, please call your pharmacy.   

## 2016-09-17 ENCOUNTER — Telehealth: Payer: Self-pay | Admitting: Physician Assistant

## 2016-09-17 MED ORDER — ASPIRIN EC 81 MG PO TBEC: 81.0000 mg | DELAYED_RELEASE_TABLET | Freq: Every day | ORAL | 3 refills | Status: DC

## 2016-09-17 NOTE — Telephone Encounter (Signed)
Pt returned my call and she has been made aware of Dayna Dunn, PA-C and Dr. Theodosia Blender decision to reduce her ASA down to 81 mg. Pt agreeable to this plan Medication changed on pts med list. Pt verbalized understanding.

## 2016-09-17 NOTE — Telephone Encounter (Signed)
Please let patient know I reviewed her chart with Dr. Radford Pax since she's on full dose aspirin - traditionally more recently we've been switching people to 81mg  daily due to less risk of bleeding (and no significant benefit with higher dose). Would recommend she decrease to 81mg  daily. Thanks! Arianna Delsanto PA-C

## 2016-09-17 NOTE — Telephone Encounter (Signed)
Placed call to pt re: Aspirin. Left a message for pt to call back.

## 2016-11-04 ENCOUNTER — Other Ambulatory Visit: Payer: Self-pay | Admitting: Physician Assistant

## 2016-11-04 NOTE — Telephone Encounter (Signed)
Medication Detail    Disp Refills Start End   magnesium oxide (MAG-OX) 400 MG tablet 30 tablet 9 06/11/2016    Sig - Route: Take 1 tablet (400 mg total) by mouth daily. - Oral   Sent to pharmacy as: magnesium oxide (MAG-OX) 400 MG tablet   E-Prescribing Status: Receipt confirmed by pharmacy (06/11/2016 4:43 PM EDT)   Pharmacy   [id number] - [place], [place] - [address]

## 2016-12-09 ENCOUNTER — Other Ambulatory Visit: Payer: Self-pay | Admitting: Hematology and Oncology

## 2016-12-09 DIAGNOSIS — Z1231 Encounter for screening mammogram for malignant neoplasm of breast: Secondary | ICD-10-CM

## 2016-12-10 ENCOUNTER — Ambulatory Visit
Admission: RE | Admit: 2016-12-10 | Discharge: 2016-12-10 | Disposition: A | Discharge status: Home / Self Care | Payer: Medicare HMO | Source: Ambulatory Visit | Attending: Hematology and Oncology | Admitting: Hematology and Oncology

## 2016-12-10 DIAGNOSIS — Z1231 Encounter for screening mammogram for malignant neoplasm of breast: Secondary | ICD-10-CM

## 2017-01-04 ENCOUNTER — Other Ambulatory Visit: Payer: Self-pay | Admitting: Cardiology

## 2017-04-29 ENCOUNTER — Telehealth: Payer: Self-pay | Admitting: Hematology and Oncology

## 2017-04-29 ENCOUNTER — Inpatient Hospital Stay: Payer: Medicare HMO | Attending: Hematology and Oncology | Admitting: Hematology and Oncology

## 2017-04-29 DIAGNOSIS — Z923 Personal history of irradiation: Secondary | ICD-10-CM | POA: Diagnosis not present

## 2017-04-29 DIAGNOSIS — Z9221 Personal history of antineoplastic chemotherapy: Secondary | ICD-10-CM | POA: Diagnosis not present

## 2017-04-29 DIAGNOSIS — Z170 Estrogen receptor positive status [ER+]: Secondary | ICD-10-CM

## 2017-04-29 DIAGNOSIS — Z79899 Other long term (current) drug therapy: Secondary | ICD-10-CM | POA: Diagnosis not present

## 2017-04-29 DIAGNOSIS — Z79811 Long term (current) use of aromatase inhibitors: Secondary | ICD-10-CM | POA: Diagnosis not present

## 2017-04-29 DIAGNOSIS — R0789 Other chest pain: Secondary | ICD-10-CM | POA: Diagnosis not present

## 2017-04-29 DIAGNOSIS — N951 Menopausal and female climacteric states: Secondary | ICD-10-CM | POA: Diagnosis not present

## 2017-04-29 DIAGNOSIS — C50312 Malignant neoplasm of lower-inner quadrant of left female breast: Secondary | ICD-10-CM

## 2017-04-29 MED ORDER — LETROZOLE 2.5 MG PO TABS: ORAL_TABLET | ORAL | 3 refills | Status: DC

## 2017-04-29 MED ORDER — FLUCONAZOLE POWD: 1.0000 "application " | Freq: Two times a day (BID) | 1 refills | Status: DC

## 2017-04-29 NOTE — Progress Notes (Signed)
Patient Care Team: Vicenta Aly, FNP as PCP - General (Nurse Practitioner) Neldon Mc, MD as Surgeon (General Surgery) Sueanne Margarita, MD as Consulting Physician (Cardiology)  DIAGNOSIS:  Encounter Diagnosis  Name Primary?  . 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 Her clinical staging was stage IIB (T3, N0, M0).        03/23/2011 Surgery    L mastectomy + Axillary LND 6 cm ILC with LVI, PNI; 9/16 LN positive with Extracapsular ext ER 100%; PR 100%; Her 2 1.31; Ki 67: 18; pT3a N2a (stage IIIA)      04/17/2011 - 06/19/2011 Chemotherapy    FEC X 4 complicated by febrile neutropenis, spesis and chest wall abscess      10/07/2011 - 11/24/2011 Radiation Therapy    Left chest wall and axilla 5040 cGy in 28 # with Boost      10/16/2011 -  Anti-estrogen oral therapy    Letrozole 2.5 mg po daily      05/27/2015 - 05/30/2015 Hospital Admission    Acute systolic CHF, dilated cardiomyopathy, EF 35-40%      08/09/2015 Surgery    Right lumpectomy: Fibrocystic changes with calcifications, right nipple lesion: Seborrheic keratosis no evidence of malignancy       CHIEF COMPLIANT: Follow-up on letrozole therapy  INTERVAL HISTORY: [name] is a 74-year with above-mentioned history of left breast cancer treated with lumpectomy followed by adjuvant chemotherapy and radiation is currently on antiestrogen therapy with letrozole.  She is tolerating it very well.  She denies any lumps or nodules in the breast or chest wall.  She had a rash underneath the right breast related to a fungal infection.  REVIEW OF SYSTEMS:   Constitutional: Denies fevers, chills or abnormal weight loss Eyes: Denies  blurriness of vision Ears, nose, mouth, throat, and face: Denies mucositis or sore throat Respiratory: Denies cough, dyspnea or wheezes Cardiovascular: Denies palpitation, chest discomfort Gastrointestinal:  Denies nausea, heartburn or change in bowel habits Skin: Denies abnormal skin rashes Lymphatics: Denies new lymphadenopathy or easy bruising Neurological:Denies numbness, tingling or new weaknesses Behavioral/Psych: Mood is stable, no new changes  Extremities: No lower extremity edema Breast: Rash underneath the right breast All other systems were reviewed with the patient and are negative.  I have reviewed the past medical history, past surgical history, social history and family history with the patient and they are unchanged from previous note.  ALLERGIES:  is allergic to oxycodone-acetaminophen; cephalexin; clindamycin; dilaudid [hydromorphone hcl]; oxycodone-acetaminophen; rocephin [ceftriaxone sodium in dextrose]; and sulfonamide derivatives.  MEDICATIONS:  Current Outpatient Medications  Medication Sig Dispense Refill  . acyclovir (ZOVIRAX) 200 MG capsule TAKE 2 CAPSULES BY MOUTH 3 TIMES DAILY 540 capsule 3  . albuterol (PROVENTIL) (2.5 MG/3ML) 0.083% nebulizer solution Take 2.5 mg by nebulization every 6 (six) hours as needed for shortness of breath.     . ALPRAZolam (XANAX) 0.5 MG tablet TAKE ONE-HALF TO ONE TABLET BY MOUTH THREE TIMES DAILY AS NEEDED FOR  NERVES 270 tablet 1  . aspirin EC 81 MG tablet Take 1 tablet (81 mg total) by mouth daily. 90 tablet 3  . atorvastatin (LIPITOR) 40 MG tablet Take 40 mg by mouth daily.    . carvedilol (COREG) 6.25 MG  tablet TAKE 1 TABLET TWICE DAILY WITH A MEAL 180 tablet 1  . cholecalciferol (VITAMIN D) 1000 UNITS tablet Take 1 tablet (1,000 Units total) by mouth daily. 90 tablet 3  . cyanocobalamin 500 MCG tablet Take 1 tablet (500 mcg total) by mouth daily. 90 tablet 3  . insulin glargine (LANTUS) 100 UNIT/ML injection Inject 15 Units  into the skin at bedtime.    . Insulin Pen Needle (RELION PEN NEEDLE 31G/8MM) 31G X 8 MM MISC 1 Device by Does not apply route 4 (four) times daily. 120 each 11  . letrozole (FEMARA) 2.5 MG tablet TAKE ONE TABLET BY MOUTH DAILY. 90 tablet 3  . Liraglutide (VICTOZA) 18 MG/3ML SOPN Inject 3 mLs (18 mg total) into the skin every morning. 6 mL   . losartan (COZAAR) 100 MG tablet Take 1 tablet (100 mg total) by mouth daily. 90 tablet 3  . magnesium oxide (MAG-OX) 400 MG tablet Take 1 tablet (400 mg total) by mouth daily. 30 tablet 9  . Multiple Vitamins-Minerals (ONE-A-DAY WOMENS 50+ ADVANTAGE) TABS Take 1 tablet by mouth daily. 90 tablet 3  . omeprazole (PRILOSEC) 40 MG capsule Take 40 mg by mouth daily.    . sertraline (ZOLOFT) 50 MG tablet Take 1 tablet (50 mg total) by mouth daily. 90 tablet 3  . spironolactone (ALDACTONE) 25 MG tablet TAKE 1 TABLET BY MOUTH EVERY DAY 30 tablet 6  . tolterodine (DETROL LA) 4 MG 24 hr capsule Take 1 capsule by mouth daily.     No current facility-administered medications for this visit.     PHYSICAL EXAMINATION: ECOG PERFORMANCE STATUS: 1 - Symptomatic but completely ambulatory  Vitals:   04/29/17 1347  BP: 126/85  Pulse: 82  Resp: 18  Temp: 98 F (36.7 C)  SpO2: 97%   Filed Weights   04/29/17 1347  Weight: 174 lb 3.2 oz (79 kg)    GENERAL:alert, no distress and comfortable SKIN: skin color, texture, turgor are normal, no rashes or significant lesions EYES: normal, Conjunctiva are pink and non-injected, sclera clear OROPHARYNX:no exudate, no erythema and lips, buccal mucosa, and tongue normal  NECK: supple, thyroid normal size, non-tender, without nodularity LYMPH:  no palpable lymphadenopathy in the cervical, axillary or inguinal LUNGS: clear to auscultation and percussion with normal breathing effort HEART: regular rate & rhythm and no murmurs and no lower extremity edema ABDOMEN:abdomen soft, non-tender and normal bowel  sounds MUSCULOSKELETAL:no cyanosis of digits and no clubbing  NEURO: alert & oriented x 3 with fluent speech, no focal motor/sensory deficits EXTREMITIES: No lower extremity edema BREAST: No palpable masses or nodules in either right breast no lumps or nodules in the left chest wall or axilla. No palpable axillary supraclavicular or infraclavicular adenopathy no breast tenderness or nipple discharge. (exam performed in the presence of a chaperone)  LABORATORY DATA:  I have reviewed the data as listed CMP Latest Ref Rng & Units 04/29/2016 11/04/2015 08/08/2015  Glucose 70 - 140 mg/dl 93 153(H) 114(H)  BUN 7.0 - 26.0 mg/dL 14.'4 17 13  '$ Creatinine 0.6 - 1.1 mg/dL 0.8 0.73 1.10(H)  Sodium 136 - 145 mEq/L 142 142 141  Potassium 3.5 - 5.1 mEq/L 4.1 4.1 5.3(H)  Chloride 98 - 110 mmol/L - 104 103  CO2 22 - 29 mEq/L 31(H) 29 31  Calcium 8.4 - 10.4 mg/dL 10.3 10.0 10.2  Total Protein 6.4 - 8.3 g/dL 7.0 - -  Total Bilirubin 0.20 - 1.20 mg/dL 0.53 - -  Alkaline Phos 40 -  150 U/L 127 - -  AST 5 - 34 U/L 15 - -  ALT 0 - 55 U/L 15 - -    Lab Results  Component Value Date   WBC 6.7 04/29/2016   HGB 14.4 04/29/2016   HCT 43.2 04/29/2016   MCV 89.9 04/29/2016   PLT 227 04/29/2016   NEUTROABS 3.2 04/29/2016    ASSESSMENT & PLAN:  Primary cancer of lower-inner quadrant of left female breast ([place]) Left breast invasive lobular cancer diagnosed 02/20/2011 status post left mastectomy with axillary lymph node dissection, 6 cm ILC with LVI, PNI, 9/16 lymph nodes positive with extracapsular extension, ER 100%, PR 100%, HER-2 -1.31, Ki-67 18%, T3a N2 a M0 stage IIIa status post F AC chemotherapy 4 followed by radiation and currently on letrozole 2.5 mg daily since 10/16/2011 Recommended 10 years of antiestrogen therapy  Letrozole toxicities: 1. Hot flashes but not very severe 2. Occasional chest tightness  Breast cancer surveillance: 1. Mammogram  12/10/2016 no mammographic evidence of malignancy breast  density category C  2. Chest wall and breast exam 04/29/2017: No evidence of breast cancer recurrence  3. Bone density 12/22/2016: T score -1.8 improved from -2.1 I recommended that she can continue with calcium and vitamin D and does not need bisphosphonate therapy at this time  Rash underneath the right breast: Prescribe fluconazole powder  Right lumpectomy 08/09/2015: Fibrocystic changes no residual ADH, right nipple seborrheic keratosis  Return to clinic in 1 year for follow-up    No orders of the defined types were placed in this encounter.  The patient has a good understanding of the overall plan. she agrees with it. she will call with any problems that may develop before the next visit here.   Harriette Ohara, MD 04/29/17

## 2017-04-29 NOTE — Telephone Encounter (Signed)
Gave patient AVS and calendar of upcoming march 2020 appointments.

## 2017-04-29 NOTE — Assessment & Plan Note (Signed)
Left breast invasive lobular cancer diagnosed 02/20/2011 status post left mastectomy with axillary lymph node dissection, 6 cm ILC with LVI, PNI, 9/16 lymph nodes positive with extracapsular extension, ER 100%, PR 100%, HER-2 -1.31, Ki-67 18%, T3a N2 a M0 stage IIIa status post F AC chemotherapy 4 followed by radiation and currently on letrozole 2.5 mg daily since 10/16/2011 Recommended 10 years of antiestrogen therapy  Letrozole toxicities: 1. Hot flashes but not very severe 2. Occasional chest tightness  Breast cancer surveillance: 1. Mammogram  12/10/2016 no mammographic evidence of malignancy breast density category C  2. Chest wall and breast exam 04/29/2017: No evidence of breast cancer recurrence  3. Bone density 12/22/2016: T score -1.8 improved from -2.1 I recommended that she can continue with calcium and vitamin D and does not need bisphosphonate therapy at this time  Right lumpectomy 08/09/2015: Fibrocystic changes no residual ADH, right nipple seborrheic keratosis  Return to clinic in 1 year for follow-up

## 2017-05-04 ENCOUNTER — Other Ambulatory Visit: Payer: Self-pay

## 2017-05-04 MED ORDER — FLUCONAZOLE POWD: 1.0000 "application " | Freq: Two times a day (BID) | 1 refills | Status: DC

## 2017-05-06 ENCOUNTER — Other Ambulatory Visit: Payer: Self-pay

## 2017-05-06 MED ORDER — FLUCONAZOLE POWD: 1.0000 "application " | Freq: Two times a day (BID) | 1 refills | Status: DC

## 2017-05-06 NOTE — Telephone Encounter (Signed)
Received message from patients [facility] stating they cannot order the Fluconazole powder Dr. Lindi Adie ordered. Informed patient of this and [facility] to [facility] [address] to see if [facility] are able to fill this.  Cyndia Bent RN

## 2017-05-11 ENCOUNTER — Other Ambulatory Visit: Payer: Self-pay

## 2017-05-11 MED ORDER — FLUCONAZOLE 100 MG PO TABS: 100.0000 mg | ORAL_TABLET | Freq: Every day | ORAL | 0 refills | Status: DC

## 2017-05-26 ENCOUNTER — Other Ambulatory Visit: Payer: Self-pay | Admitting: Cardiology

## 2017-08-19 ENCOUNTER — Emergency Department (HOSPITAL_COMMUNITY): Payer: Medicare HMO

## 2017-08-19 ENCOUNTER — Other Ambulatory Visit: Payer: Self-pay

## 2017-08-19 ENCOUNTER — Emergency Department (HOSPITAL_COMMUNITY)
Admission: EM | Admit: 2017-08-19 | Discharge: 2017-08-19 | Disposition: A | Discharge status: Home / Self Care | Payer: Medicare HMO | Attending: Emergency Medicine | Admitting: Emergency Medicine

## 2017-08-19 ENCOUNTER — Encounter (HOSPITAL_COMMUNITY): Payer: Self-pay | Admitting: Emergency Medicine

## 2017-08-19 DIAGNOSIS — Z79899 Other long term (current) drug therapy: Secondary | ICD-10-CM | POA: Insufficient documentation

## 2017-08-19 DIAGNOSIS — Z853 Personal history of malignant neoplasm of breast: Secondary | ICD-10-CM | POA: Insufficient documentation

## 2017-08-19 DIAGNOSIS — Y929 Unspecified place or not applicable: Secondary | ICD-10-CM | POA: Diagnosis not present

## 2017-08-19 DIAGNOSIS — E119 Type 2 diabetes mellitus without complications: Secondary | ICD-10-CM | POA: Insufficient documentation

## 2017-08-19 DIAGNOSIS — Z9012 Acquired absence of left breast and nipple: Secondary | ICD-10-CM | POA: Insufficient documentation

## 2017-08-19 DIAGNOSIS — Y999 Unspecified external cause status: Secondary | ICD-10-CM | POA: Insufficient documentation

## 2017-08-19 DIAGNOSIS — Z794 Long term (current) use of insulin: Secondary | ICD-10-CM | POA: Diagnosis not present

## 2017-08-19 DIAGNOSIS — I5042 Chronic combined systolic (congestive) and diastolic (congestive) heart failure: Secondary | ICD-10-CM | POA: Insufficient documentation

## 2017-08-19 DIAGNOSIS — Y9389 Activity, other specified: Secondary | ICD-10-CM | POA: Diagnosis not present

## 2017-08-19 DIAGNOSIS — M25521 Pain in right elbow: Secondary | ICD-10-CM

## 2017-08-19 DIAGNOSIS — T148XXA Other injury of unspecified body region, initial encounter: Secondary | ICD-10-CM | POA: Diagnosis not present

## 2017-08-19 DIAGNOSIS — I110 Hypertensive heart disease with heart failure: Secondary | ICD-10-CM | POA: Diagnosis not present

## 2017-08-19 DIAGNOSIS — T07XXXA Unspecified multiple injuries, initial encounter: Secondary | ICD-10-CM

## 2017-08-19 DIAGNOSIS — W108XXA Fall (on) (from) other stairs and steps, initial encounter: Secondary | ICD-10-CM | POA: Insufficient documentation

## 2017-08-19 DIAGNOSIS — W19XXXA Unspecified fall, initial encounter: Secondary | ICD-10-CM

## 2017-08-19 DIAGNOSIS — M79641 Pain in right hand: Secondary | ICD-10-CM

## 2017-08-19 DIAGNOSIS — S0990XA Unspecified injury of head, initial encounter: Secondary | ICD-10-CM | POA: Diagnosis not present

## 2017-08-19 NOTE — ED Provider Notes (Signed)
[facility] EMERGENCY DEPARTMENT Provider Note   CSN: [id number] Arrival date & time: 08/19/17  2131     History   Chief Complaint Chief Complaint  Patient presents with  . Fall    HPI [name] is a 77 y.o. female with a hx of anxiety, asthma, h/o breast cancer, dilated cardiomyopathy, combined chronica and systolic congestive heart failure, NIDDM presents to the Emergency Department complaining of acute, persistent, right hand and elbow pain after a fall around 9pm tonight.  Pt reports she was walking down the stairs when she missed the last 2 and fell forward.  She reports she caught herself with her right hand, elbow and knee.  She has associated abrasions to the bilateral hands, right elbow, right knee and bilateral feet.  She denies hitting her head, numbness, tingling, weakness, syncope, loss of bowel or bladder control.    The history is provided by the patient and medical records. No language interpreter was used.    Past Medical History:  Diagnosis Date  . ALLERGIC RHINITIS 12/13/2007  . ANXIETY 06/04/2008  . Asthma   . Blood transfusion 1964   post childbirth  . 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 Chronic combined systolic (congestive) and diastolic (congestive) heart failure ([place])   . DEGENERATIVE JOINT DISEASE 12/17/2006  . DEPRESSION 08/10/2007  . Dilated cardiomyopathy ([place]) 05/29/2015   a. Dx 05/2015 - EF 35-40% - felt secondary to XRT and chemo for breast CA. Normal coronaries by cath.  . DM type 2 (diabetes mellitus, type 2) ([place])   . Essential hypertension   . GERD 12/17/2006  . History of chemotherapy   . History of radiation therapy 10/07/11-11/24/11   left breast,5040 cGy/28 sessions,l chest wall boost=1000cGy/5 sesions  . 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 Hyperlipidemia   . OBESITY 06/04/2008  . 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 Premature atrial contractions   . Sinus bradycardia    a. 2018 event monitor which showed NSR with average HR 71, frequent PACs, occasional PVCs, and sinus bradycardia as low as 50.    Patient Active Problem List   Diagnosis Date Noted  . PVC's (premature ventricular contractions) 05/28/2016  . NICM (nonischemic cardiomyopathy) ([place]) 05/28/2016  . Premature atrial contractions 04/21/2016  . Chronic combined systolic and diastolic CHF (congestive heart failure) ([place]) 07/17/2015  . Dilated cardiomyopathy ([place]) 05/29/2015  . Recurrent major depressive disorder ([place]) 02/28/2015  . Acute bronchitis 12/19/2014  . Acid reflux 06/20/2014  . Herpes 06/20/2014  . 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 H/O malignant neoplasm of breast 03/15/2013  . HLD (hyperlipidemia) 03/15/2013  . Long term current use of insulin ([place]) 03/15/2013  . 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 Controlled type 2 diabetes mellitus without complication ([place]) 30/16/0109  . Mixed incontinence 03/15/2013  . Macular degeneration 03/02/2013  . LBP (low back pain) 02/23/2013  . Open wound of left breast 05/22/2011  . Primary cancer of lower-inner quadrant of left female breast ([place]) 02/20/2011  . DM type 2 (diabetes mellitus, type 2) ([place]) 08/10/2007  . DEPRESSION 08/10/2007  . HYPERCHOLESTEROLEMIA 12/17/2006  . Essential hypertension 12/17/2006  . GERD 12/17/2006    Past Surgical History:  Procedure Laterality Date  . bladder tack  1974  . BREAST EXCISIONAL BIOPSY  11/10/1988   left benign Dr Margot Chimes  . BREAST EXCISIONAL BIOPSY  03/04/1983   Right - two areas - Dr  Streck  . BREAST EXCISIONAL BIOPSY  10/09/1980   right - Dr Margot Chimes  . BREAST EXCISIONAL BIOPSY  10/18/1979   left - Dr Margot Chimes  . BREAST EXCISIONAL BIOPSY  01/02/1994   right - Dr Margot Chimes  . BREAST EXCISIONAL BIOPSY  2017   right  . BREAST SURGERY    . CARDIAC CATHETERIZATION N/A 05/29/2015   Procedure: Left Heart Cath and Coronary Angiography;  Surgeon: Belva Crome, MD;  Location: [place]  CV LAB;  Service: Cardiovascular;  Laterality: N/A;  . CHOLECYSTECTOMY  02/23/1991   LC - Dr Margot Chimes  . COLONOSCOPY    . ENTEROCELE REPAIR  06/08   Dr. Cletis Media  . EVACUATION BREAST HEMATOMA  07/01/2011   Procedure: EVACUATION HEMATOMA BREAST;  Surgeon: Haywood Lasso, MD;  Location: [facility];  Service: General;  Laterality: Left;  Drainage of left mastectomy seroma  . HIP SURGERY  08/27/2010   DR. Maureen Ralphs  . LAMINECTOMY  1993   s/p-Dr. Rita Ohara  . MASTECTOMY Left 03/23/2011  . MODIFIED RADICAL MASTECTOMY W/ AXILLARY LYMPH NODE DISSECTION Left 03-30-11  . PORT-A-CATH REMOVAL  12/02/2011   Procedure: REMOVAL PORT-A-CATH;  Surgeon: Haywood Lasso, MD;  Location: [place];  Service: General;  Laterality: N/A;  . PORTACATH PLACEMENT  04/13/2011   Procedure: INSERTION PORT-A-CATH;  Surgeon: Haywood Lasso, MD;  Location: [place];  Service: General;  Laterality: N/A;  porta cath placement,removal of J-P drain  . RADIOACTIVE SEED GUIDED EXCISIONAL BREAST BIOPSY Right 08/09/2015   Procedure: RIGHT RADIOACTIVE SEED GUIDED EXCISIONAL BREAST BIOPSY, RIGHT NIPPLE LESION EXCISION;  Surgeon: Rolm Bookbinder, MD;  Location: [place];  Service: General;  Laterality: Right;  . ROTATOR CUFF REPAIR  8/03   right, Dr. Tonita Cong  . ROTATOR CUFF REPAIR  10/26/2008   left, Dr. Rush Farmer  . TONSILLECTOMY    . VAGINAL HYSTERECTOMY  1974   partial      OB History    Gravida  5   Para  3   Term      Preterm      AB      Living  3     SAB      TAB      Ectopic      Multiple      Live Births               Home Medications    Prior to Admission medications   Medication Sig Start Date End Date Taking? Authorizing Provider  acyclovir (ZOVIRAX) 200 MG capsule TAKE 2 CAPSULES BY MOUTH 3 TIMES DAILY 10/23/13   Noralee Space, MD  albuterol (PROVENTIL) (2.5 MG/3ML) 0.083% nebulizer solution Take 2.5 mg by nebulization every 6 (six) hours as  needed for shortness of breath.  02/05/14   [provider]  ALPRAZolam Duanne Moron) 0.5 MG tablet TAKE ONE-HALF TO ONE TABLET BY MOUTH THREE TIMES DAILY AS NEEDED FOR  NERVES 02/23/13   Noralee Space, MD  atorvastatin (LIPITOR) 40 MG tablet Take 40 mg by mouth daily.    [provider]  carvedilol (COREG) 6.25 MG tablet TAKE 1 TABLET TWICE DAILY WITH A MEAL 05/27/17   Turner, Eber Hong, MD  cholecalciferol (VITAMIN D) 1000 UNITS tablet Take 1 tablet (1,000 Units total) by mouth daily. 02/23/13   Noralee Space, MD  cyanocobalamin 500 MCG tablet Take 1 tablet (500 mcg total) by mouth daily. 02/23/13   Noralee Space, MD  fluconazole (DIFLUCAN) 100 MG tablet Take 1 tablet (100 mg total) by mouth daily. 05/11/17   Nicholas Lose, MD  Insulin Pen Needle (RELION PEN NEEDLE 31G/8MM) 31G X 8 MM MISC 1 Device by Does not apply route 4 (four) times daily. 10/05/12   Renato Shin, MD  letrozole [facility]) 2.5 MG tablet TAKE ONE TABLET BY MOUTH DAILY. 04/29/17   Nicholas Lose, MD  Liraglutide (VICTOZA) 18 MG/3ML SOPN Inject 3 mLs (18 mg total) into the skin every morning. 04/18/15   Nicholas Lose, MD  losartan (COZAAR) 100 MG tablet Take 1 tablet (100 mg total) by mouth daily. 02/23/13   Noralee Space, MD  magnesium oxide (MAG-OX) 400 MG tablet Take 1 tablet (400 mg total) by mouth daily. 06/11/16   Dunn, Nedra Hai, PA-C  Multiple Vitamins-Minerals (ONE-A-DAY WOMENS 50+ ADVANTAGE) TABS Take 1 tablet by mouth daily. 02/23/13   Noralee Space, MD  omeprazole (PRILOSEC) 40 MG capsule Take 40 mg by mouth daily.    [provider]  sertraline (ZOLOFT) 50 MG tablet Take 1 tablet (50 mg total) by mouth daily. 02/23/13   Noralee Space, MD  spironolactone (ALDACTONE) 25 MG tablet TAKE 1 TABLET BY MOUTH EVERY DAY 07/31/16   Sueanne Margarita, MD  tolterodine (DETROL LA) 4 MG 24 hr capsule Take 1 capsule by mouth daily. 06/09/16   [provider]  prochlorperazine (COMPAZINE) 10 MG tablet Take 1 tablet (10 mg  total) by mouth every 6 (six) hours as needed (Nausea or vomiting). 07/14/11 07/14/11  Marcy Panning, MD    Family History Family History  Problem Relation Age of Onset  . COPD Mother   . Arthritis Mother   . Emphysema Mother   . Diabetes Brother   . Cancer Brother        prostate  . Cancer Father        malignant brain tumor  . Mental illness Paternal Grandfather   . Heart disease Other        Sibling  . Diabetes Other        Sibling     Social History Social History   Tobacco Use  . Smoking status: Never Smoker  . Smokeless tobacco: Never Used  Substance Use Topics  . Alcohol use: No  . Drug use: No     Allergies   Oxycodone-acetaminophen; Cephalexin; Clindamycin; Dilaudid [hydromorphone hcl]; Oxycodone-acetaminophen; Rocephin [ceftriaxone sodium in dextrose]; and Sulfonamide derivatives   Review of Systems Review of Systems  Constitutional: Negative for chills and fever.  HENT: Negative for nosebleeds.   Eyes: Negative for visual disturbance.  Respiratory: Negative for shortness of breath.   Cardiovascular: Negative for chest pain.  Gastrointestinal: Negative for nausea and vomiting.  Musculoskeletal: Positive for arthralgias and joint swelling. Negative for back pain, neck pain and neck stiffness.  Skin: Positive for wound.  Neurological: Negative for numbness.  Hematological: Does not bruise/bleed easily.  Psychiatric/Behavioral: The patient is not nervous/anxious.   All other systems reviewed and are negative.    Physical Exam Updated Vital Signs BP (!) 161/83 (BP Location: Right Arm)   Pulse 95   Temp 98.5 F (36.9 C) (Oral)   Resp 18   SpO2 97%   Physical Exam  Constitutional: She appears well-developed and well-nourished. No distress.  HENT:  Head: Normocephalic and atraumatic.  Eyes: Conjunctivae are normal.  Neck: Normal range of motion. No spinous process tenderness and no muscular tenderness present. Normal range of motion present.    Cardiovascular:  Normal rate, regular rhythm and intact distal pulses.  Capillary refill < 3 sec  Pulmonary/Chest: Effort normal.  Abdominal: She exhibits no distension.  Musculoskeletal: She exhibits tenderness. She exhibits no edema.       Right shoulder: Normal.       Right elbow: She exhibits swelling and laceration ( abrasion). Tenderness found.       Right wrist: She exhibits tenderness, bony tenderness, swelling and laceration ( numerous abrasions). She exhibits normal range of motion.       Right knee: She exhibits laceration ( several abrasions). She exhibits normal range of motion. No tenderness found.       Left knee: She exhibits normal range of motion. No tenderness found.       Right ankle: Normal.       Left ankle: Normal.       Right hand: She exhibits laceration ( numerous abrasions). She exhibits normal range of motion, no tenderness, normal capillary refill and no deformity. Normal sensation noted. Normal strength noted.       Left hand: She exhibits laceration ( several abrasions). She exhibits normal range of motion. Normal sensation noted. Normal strength noted.       Right foot: There is laceration ( abrasion to the little toe).       Left foot: There is laceration ( abrasion to the great toe).  Neurological: She is alert. Coordination normal.  Sensation intact to normal touch in all extremities Strength 5/5 in all extremities  Skin: Skin is warm and dry. She is not diaphoretic.  No tenting of the skin  Psychiatric: She has a normal mood and affect.  Nursing note and vitals reviewed.    ED Treatments / Results   Radiology Dg Elbow Complete Right  Result Date: 08/19/2017 CLINICAL DATA:  77 year old female with fall and right elbow injury. EXAM: RIGHT ELBOW - COMPLETE 3+ VIEW COMPARISON:  Right humerus radiograph dated 11/04/2015 FINDINGS: There is no evidence of fracture, dislocation, or joint effusion. There is no evidence of arthropathy or other focal bone  abnormality. Soft tissues are unremarkable. IMPRESSION: Negative. Electronically Signed   By: Anner Crete M.D.   On: 08/19/2017 22:27   Ct Head Wo Contrast  Result Date: 08/19/2017 CLINICAL DATA:  Fall down stairs, head trauma. EXAM: CT HEAD WITHOUT CONTRAST TECHNIQUE: Contiguous axial images were obtained from the base of the skull through the vertex without intravenous contrast. COMPARISON:  Overlapping portions of PET-CT dated 04/09/2011 FINDINGS: Brain: The brainstem, cerebellum, cerebral peduncles, thalami, basal ganglia, basilar cisterns, and ventricular system appear within normal limits. No intracranial hemorrhage, mass lesion, or acute CVA. Vascular: Unremarkable Skull: Mild hyperostosis frontalis interna. Sinuses/Orbits: Chronic right sphenoid sinusitis and mild chronic ethmoid sinusitis. Other: No supplemental non-categorized findings. IMPRESSION: 1. No acute intracranial findings. 2. Chronic right sphenoid and chronic ethmoid sinusitis. Electronically Signed   By: Van Clines M.D.   On: 08/19/2017 22:57    Procedures Procedures (including critical care time)  Medications Ordered in ED Medications - No data to display   Initial Impression / Assessment and Plan / ED Course  I have reviewed the triage vital signs and the nursing notes.  Pertinent labs & imaging results that were available during my care of the patient were reviewed by me and considered in my medical decision making (see chart for details).     Pt presents after mechanical fall.  She is not anticoagulated.  No syncope, chest pain, SOB or dizziness.  CT head and  elbow films without acute abnormality.  I personally evaluated the images.  Pt with TTP along the scaphoid and lunar bones.  I recommended x-ray of this area for possible fracture.  Normal sensation and capillary refill in that right hand.  Patient does not wish for x-ray here in the emergency department.  She was placed in a thumb spica splint for  support and instructed to follow-up with her primary care physician for x-ray if pain persists greater than several days.  Patient is able to ambulate here in emergency department with steady gait.  Patient and family state understanding and are in agreement with this plan.  The patient was discussed with and seen by Dr. Betsey Holiday who agrees with the treatment plan.   Final Clinical Impressions(s) / ED Diagnoses   Final diagnoses:  Fall, initial encounter  Abrasions of multiple sites  Right elbow pain  Right hand pain    ED Discharge Orders    None       Dailon Sheeran, Gwenlyn Perking 08/19/17 2347    Orpah Greek, MD 08/20/17 [phone]

## 2017-08-19 NOTE — ED Triage Notes (Signed)
Pt BIB [facility], was walking down a flight of steps, tripped and fell down last 3 steps. Denies LOC/neck/back pain. C/o right shoulder and elbow pain. No blood thinners.

## 2017-08-19 NOTE — Discharge Instructions (Addendum)
1. Medications: alternate naprosyn or ibuprofen and tylenol for pain control, usual home medications 2. Treatment: rest, ice, elevate and use brace, drink plenty of fluids, gentle stretching 3. Follow Up: Please followup with orthopedics as directed or your PCP in 1 week if no improvement for discussion of your diagnoses and further evaluation after today's visit; if you do not have a primary care doctor use the resource guide provided to find one; Please return to the ER for worsening symptoms or other concerns

## 2017-10-01 ENCOUNTER — Ambulatory Visit: Payer: Medicare HMO | Admitting: Podiatry

## 2017-10-01 ENCOUNTER — Other Ambulatory Visit: Payer: Self-pay | Admitting: Podiatry

## 2017-10-01 ENCOUNTER — Ambulatory Visit (INDEPENDENT_AMBULATORY_CARE_PROVIDER_SITE_OTHER): Payer: Medicare HMO

## 2017-10-01 ENCOUNTER — Encounter: Payer: Self-pay | Admitting: Podiatry

## 2017-10-01 DIAGNOSIS — S99922A Unspecified injury of left foot, initial encounter: Secondary | ICD-10-CM | POA: Diagnosis not present

## 2017-10-01 DIAGNOSIS — M779 Enthesopathy, unspecified: Secondary | ICD-10-CM

## 2017-10-01 MED ORDER — TRIAMCINOLONE ACETONIDE 10 MG/ML IJ SUSP
10.0000 mg | Freq: Once | INTRAMUSCULAR | Status: AC
  Administered 2017-10-01: 10 mg

## 2017-10-01 NOTE — Progress Notes (Signed)
   Subjective:    Patient ID: [name], female    DOB: 04/19/1940, 77 y.o.   MRN: [id number]  HPI    Review of Systems  All other systems reviewed and are negative.      Objective:   Physical Exam        Assessment & Plan:

## 2017-10-01 NOTE — Progress Notes (Signed)
Subjective:   Patient ID: [name], female   DOB: 77 y.o.   MRN: [id number]   HPI Patient presents stating she fell in July and has had pain on top of her left first metatarsal and did have a open area but that has healed over and there is been no redness no drainage but it sore.  Patient's sugars under excellent control she does not smoke likes to be active   Review of Systems  All other systems reviewed and are negative.       Objective:  Physical Exam  Constitutional: She appears well-developed and well-nourished.  Cardiovascular: Intact distal pulses.  Pulmonary/Chest: Effort normal.  Musculoskeletal: Normal range of motion.  Neurological: She is alert.  Skin: Skin is warm.  Nursing note and vitals reviewed.   Neurovascular status intact muscle strength is adequate range of motion within normal limits with patient found to have an inflamed area on the dorsal medial aspect first MPJ with mild fluid accumulation and no redness no drainage and a small area of skin irritation that is not open or showing any signs of ulceration     Assessment:  Probability for dorsal medial capsulitis with inflammation present     Plan:  H&P condition reviewed and careful small injection administered 2 mg dexamethasone Kenalog into the area to reduce the inflammation gave strict instructions of any redness drainage or any pathology were to occur including pain to come back immediately  X-ray does not indicate any type of metallic area with some soft tissue swelling in the area

## 2017-10-28 ENCOUNTER — Other Ambulatory Visit: Payer: Self-pay | Admitting: Hematology and Oncology

## 2017-10-28 ENCOUNTER — Inpatient Hospital Stay: Payer: Medicare HMO | Attending: Hematology and Oncology | Admitting: Hematology and Oncology

## 2017-10-28 ENCOUNTER — Telehealth: Payer: Self-pay

## 2017-10-28 ENCOUNTER — Other Ambulatory Visit: Payer: Self-pay

## 2017-10-28 DIAGNOSIS — Z170 Estrogen receptor positive status [ER+]: Secondary | ICD-10-CM | POA: Diagnosis not present

## 2017-10-28 DIAGNOSIS — Z79899 Other long term (current) drug therapy: Secondary | ICD-10-CM | POA: Insufficient documentation

## 2017-10-28 DIAGNOSIS — Z9221 Personal history of antineoplastic chemotherapy: Secondary | ICD-10-CM | POA: Insufficient documentation

## 2017-10-28 DIAGNOSIS — Z923 Personal history of irradiation: Secondary | ICD-10-CM | POA: Insufficient documentation

## 2017-10-28 DIAGNOSIS — C773 Secondary and unspecified malignant neoplasm of axilla and upper limb lymph nodes: Secondary | ICD-10-CM | POA: Diagnosis not present

## 2017-10-28 DIAGNOSIS — C787 Secondary malignant neoplasm of liver and intrahepatic bile duct: Secondary | ICD-10-CM | POA: Insufficient documentation

## 2017-10-28 DIAGNOSIS — I509 Heart failure, unspecified: Secondary | ICD-10-CM | POA: Insufficient documentation

## 2017-10-28 DIAGNOSIS — Z79811 Long term (current) use of aromatase inhibitors: Secondary | ICD-10-CM | POA: Diagnosis not present

## 2017-10-28 DIAGNOSIS — C50312 Malignant neoplasm of lower-inner quadrant of left female breast: Secondary | ICD-10-CM

## 2017-10-28 DIAGNOSIS — Z9012 Acquired absence of left breast and nipple: Secondary | ICD-10-CM | POA: Insufficient documentation

## 2017-10-28 NOTE — Assessment & Plan Note (Signed)
Left breast invasive lobular cancer diagnosed 02/20/2011 status post left mastectomy with axillary lymph node dissection, 6 cm ILC with LVI, PNI, 9/16 lymph nodes positive with extracapsular extension, ER 100%, PR 100%, HER-2 -1.31, Ki-67 18%, T3a N2 a M0 stage IIIa status post F AC chemotherapy 4 followed by radiation and currently on letrozole 2.5 mg daily since 10/16/2011  Relapse/recurrence: Elevated LFTs led to ultrasound of the liver which revealed liver metastases September 2019  Counseling: I discussed with the patient and her husband that this indicates metastatic cancer.  We would need to evaluate this with -PET/CT scan -Liver biopsy Liver biopsy will need to be sent for molecular testing. BRCA mutation testing will also need to be done. We will follow-up after the biopsy results are available to discuss her treatment plan.

## 2017-10-28 NOTE — Telephone Encounter (Signed)
Received call from Nonda Lou, NP, at [facility] requesting to speak with Dr.Gudena regarding an incidental finding on pt ultrasound of the liver. Pt was in the office for a follow up scan and was found to have some abnormal results, that needed Dr.Gudena's attention.  Dr.Gudena spoke with NP and he notified pt via phone call. Pt coming in today to sit down with Dr.Gudena to discuss next plan. Obtained order for PET scan and possible US of the liver after results PET received. Will make and confirm appt for PET scan with pt.

## 2017-10-28 NOTE — Progress Notes (Signed)
Patient Care Team: Vicenta Aly, FNP as PCP - General (Nurse Practitioner) Neldon Mc, MD as Surgeon (General Surgery) Sueanne Margarita, MD as Consulting Physician (Cardiology)  DIAGNOSIS:  Encounter Diagnosis  Name Primary?  . Primary cancer of lower-inner quadrant of left female breast ([place])     SUMMARY OF ONCOLOGIC HISTORY:   Primary cancer of lower-inner quadrant of left female breast ([place])   02/20/2011 Initial Diagnosis    Left Breast invasive lobular carcinoma; 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 Her clinical staging was stage IIB (T3, N0, M0).      03/23/2011 Surgery    L mastectomy + Axillary LND 6 cm ILC with LVI, PNI; 9/16 LN positive with Extracapsular ext ER 100%; PR 100%; Her 2 1.31; Ki 67: 18; pT3a N2a (stage IIIA)    04/17/2011 - 06/19/2011 Chemotherapy    FEC X 4 complicated by febrile neutropenis, spesis and chest wall abscess    10/07/2011 - 11/24/2011 Radiation Therapy    Left chest wall and axilla 5040 cGy in 28 # with Boost    10/16/2011 -  Anti-estrogen oral therapy    Letrozole 2.5 mg po daily    05/27/2015 - 05/30/2015 Hospital Admission    Acute systolic CHF, dilated cardiomyopathy, EF 35-40%    08/09/2015 Surgery    Right lumpectomy: Fibrocystic changes with calcifications, right nipple lesion: Seborrheic keratosis no evidence of malignancy    10/20/2017 Imaging    Elevated LFTs letter ultrasound of the liver which showed liver metastases     CHIEF COMPLIANT: Follow-up of recent abnormality in LFTs and ultrasound of the liver showing liver metastases  INTERVAL HISTORY: [name] is a 77 year old with above-mentioned history of left breast cancer treated with left mastectomy and axillary lymph node dissection which showed 9+ lymph nodes.  She finished adjuvant chemotherapy followed by radiation and has been on letrozole for the past 6 years.  She had a prior history of acute congestive heart  failure and dilated cardiomyopathy.  She went to primary care physician for routine checkup and she had blood work that revealed elevation of LFTs.  This was further evaluated by an ultrasound of the liver which revealed liver metastases.  She was urgently called into our office to be seen today.  Patient apparently has been losing weight and had lost about 50 pounds in the past 1 year.  She has not informed anyone about this.  REVIEW OF SYSTEMS:   Constitutional: Severe weight loss of 50 pounds in the last 1 year, decreased appetite Eyes: Denies blurriness of vision Ears, nose, mouth, throat, and face: Denies mucositis or sore throat Respiratory: Denies cough, dyspnea or wheezes Cardiovascular: Denies palpitation, chest discomfort Gastrointestinal: Epigastric abdominal pain Skin: Denies abnormal skin rashes Lymphatics: Denies new lymphadenopathy or easy bruising Neurological:Denies numbness, tingling or new weaknesses Behavioral/Psych: Mood is stable, no new changes  Extremities: No lower extremity edema   All other systems were reviewed with the patient and are negative.  I have reviewed the past medical history, past surgical history, social history and family history with the patient and they are unchanged from previous note.  ALLERGIES:  is allergic to oxycodone-acetaminophen; cephalexin; clindamycin; dilaudid [hydromorphone hcl]; oxycodone-acetaminophen; rocephin [ceftriaxone sodium in dextrose]; and sulfonamide derivatives.  MEDICATIONS:  Current Outpatient Medications  Medication Sig Dispense Refill  . acyclovir (ZOVIRAX) 200 MG capsule TAKE 2 CAPSULES BY MOUTH 3 TIMES DAILY 540 capsule 3  . albuterol (PROVENTIL) (2.5 MG/3ML) 0.083% nebulizer solution Take 2.5 mg  by nebulization every 6 (six) hours as needed for shortness of breath.     . ALPRAZolam (XANAX) 0.5 MG tablet TAKE ONE-HALF TO ONE TABLET BY MOUTH THREE TIMES DAILY AS NEEDED FOR  NERVES 270 tablet 1  . atorvastatin  (LIPITOR) 40 MG tablet Take 40 mg by mouth daily.    . carvedilol (COREG) 6.25 MG tablet TAKE 1 TABLET TWICE DAILY WITH A MEAL 180 tablet 1  . cholecalciferol (VITAMIN D) 1000 UNITS tablet Take 1 tablet (1,000 Units total) by mouth daily. 90 tablet 3  . cyanocobalamin 500 MCG tablet Take 1 tablet (500 mcg total) by mouth daily. 90 tablet 3  . fluconazole (DIFLUCAN) 100 MG tablet Take 1 tablet (100 mg total) by mouth daily. 7 tablet 0  . Insulin Pen Needle (RELION PEN NEEDLE 31G/8MM) 31G X 8 MM MISC 1 Device by Does not apply route 4 (four) times daily. 120 each 11  . letrozole (FEMARA) 2.5 MG tablet TAKE ONE TABLET BY MOUTH DAILY. 90 tablet 3  . Liraglutide (VICTOZA) 18 MG/3ML SOPN Inject 3 mLs (18 mg total) into the skin every morning. 6 mL   . losartan (COZAAR) 100 MG tablet Take 1 tablet (100 mg total) by mouth daily. 90 tablet 3  . magnesium oxide (MAG-OX) 400 MG tablet Take 1 tablet (400 mg total) by mouth daily. 30 tablet 9  . Multiple Vitamins-Minerals (ONE-A-DAY WOMENS 50+ ADVANTAGE) TABS Take 1 tablet by mouth daily. 90 tablet 3  . omeprazole (PRILOSEC) 40 MG capsule Take 40 mg by mouth daily.    . sertraline (ZOLOFT) 50 MG tablet Take 1 tablet (50 mg total) by mouth daily. 90 tablet 3  . spironolactone (ALDACTONE) 25 MG tablet TAKE 1 TABLET BY MOUTH EVERY DAY 30 tablet 6  . tolterodine (DETROL LA) 4 MG 24 hr capsule Take 1 capsule by mouth daily.     No current facility-administered medications for this visit.     PHYSICAL EXAMINATION: ECOG PERFORMANCE STATUS: 1 - Symptomatic but completely ambulatory  Vitals:   10/28/17 1358  BP: (!) 150/93  Pulse: 98  Resp: 18  Temp: 98.7 F (37.1 C)  SpO2: 98%   Filed Weights   10/28/17 1358  Weight: 169 lb 14.4 oz (77.1 kg)    GENERAL:alert, no distress and comfortable SKIN: skin color, texture, turgor are normal, no rashes or significant lesions EYES: normal, Conjunctiva are pink and non-injected, sclera clear OROPHARYNX:no  exudate, no erythema and lips, buccal mucosa, and tongue normal  NECK: supple, thyroid normal size, non-tender, without nodularity LYMPH:  no palpable lymphadenopathy in the cervical, axillary or inguinal LUNGS: clear to auscultation and percussion with normal breathing effort HEART: regular rate & rhythm and no murmurs and no lower extremity edema ABDOMEN:abdomen soft, non-tender and normal bowel sounds MUSCULOSKELETAL:no cyanosis of digits and no clubbing  NEURO: alert & oriented x 3 with fluent speech, no focal motor/sensory deficits EXTREMITIES: No lower extremity edema   LABORATORY DATA:  I have reviewed the data as listed CMP Latest Ref Rng & Units 04/29/2016 11/04/2015 08/08/2015  Glucose 70 - 140 mg/dl 93 153(H) 114(H)  BUN 7.0 - 26.0 mg/dL 14.'4 17 13  '$ Creatinine 0.6 - 1.1 mg/dL 0.8 0.73 1.10(H)  Sodium 136 - 145 mEq/L 142 142 141  Potassium 3.5 - 5.1 mEq/L 4.1 4.1 5.3(H)  Chloride 98 - 110 mmol/L - 104 103  CO2 22 - 29 mEq/L 31(H) 29 31  Calcium 8.4 - 10.4 mg/dL 10.3 10.0 10.2  Total Protein  6.4 - 8.3 g/dL 7.0 - -  Total Bilirubin 0.20 - 1.20 mg/dL 0.53 - -  Alkaline Phos 40 - 150 U/L 127 - -  AST 5 - 34 U/L 15 - -  ALT 0 - 55 U/L 15 - -    Lab Results  Component Value Date   WBC 6.7 04/29/2016   HGB 14.4 04/29/2016   HCT 43.2 04/29/2016   MCV 89.9 04/29/2016   PLT 227 04/29/2016   NEUTROABS 3.2 04/29/2016    ASSESSMENT & PLAN:  Primary cancer of lower-inner quadrant of left female breast ([place]) Left breast invasive lobular cancer diagnosed 02/20/2011 status post left mastectomy with axillary lymph node dissection, 6 cm ILC with LVI, PNI, 9/16 lymph nodes positive with extracapsular extension, ER 100%, PR 100%, HER-2 -1.31, Ki-67 18%, T3a N2 a M0 stage IIIa status post F AC chemotherapy 4 followed by radiation and currently on letrozole 2.5 mg daily since 10/16/2011  Relapse/recurrence: Elevated LFTs led to ultrasound of the liver which revealed liver metastases  September 2019  Counseling: I discussed with the patient and her husband that this indicates metastatic cancer.  We would need to evaluate this with -PET/CT scan -Liver biopsy Liver biopsy will need to be sent for molecular testing. BRCA mutation testing will also need to be done. We will follow-up after the biopsy results are available to discuss her treatment plan. We briefly discussed the treatment options for metastatic breast cancer that is hormone receptor positive disease. Given her history of congestive heart failure, her treatment will need to be tailored to her.  Patient fully understands that liver involvement means that she has stage IV disease.  She understands a stage IV disease has no cure but it can be treated.   No orders of the defined types were placed in this encounter.  The patient has a good understanding of the overall plan. she agrees with it. she will call with any problems that may develop before the next visit here.   Harriette Ohara, MD 10/28/17

## 2017-11-04 ENCOUNTER — Ambulatory Visit (HOSPITAL_COMMUNITY)
Admission: RE | Admit: 2017-11-04 | Discharge: 2017-11-04 | Disposition: A | Discharge status: Home / Self Care | Payer: Medicare HMO | Source: Ambulatory Visit | Attending: Hematology and Oncology | Admitting: Hematology and Oncology

## 2017-11-04 ENCOUNTER — Encounter: Payer: Self-pay | Admitting: Hematology and Oncology

## 2017-11-04 DIAGNOSIS — K449 Diaphragmatic hernia without obstruction or gangrene: Secondary | ICD-10-CM | POA: Diagnosis not present

## 2017-11-04 DIAGNOSIS — C787 Secondary malignant neoplasm of liver and intrahepatic bile duct: Secondary | ICD-10-CM | POA: Diagnosis not present

## 2017-11-04 DIAGNOSIS — I700 Atherosclerosis of aorta: Secondary | ICD-10-CM | POA: Diagnosis not present

## 2017-11-04 DIAGNOSIS — C50312 Malignant neoplasm of lower-inner quadrant of left female breast: Secondary | ICD-10-CM | POA: Diagnosis not present

## 2017-11-04 DIAGNOSIS — R590 Localized enlarged lymph nodes: Secondary | ICD-10-CM | POA: Insufficient documentation

## 2017-11-04 DIAGNOSIS — Z9012 Acquired absence of left breast and nipple: Secondary | ICD-10-CM | POA: Diagnosis not present

## 2017-11-04 DIAGNOSIS — K7689 Other specified diseases of liver: Secondary | ICD-10-CM | POA: Diagnosis present

## 2017-11-04 DIAGNOSIS — R160 Hepatomegaly, not elsewhere classified: Secondary | ICD-10-CM | POA: Diagnosis not present

## 2017-11-04 LAB — GLUCOSE, CAPILLARY: GLUCOSE-CAPILLARY: 106 mg/dL — AB (ref 70–99)

## 2017-11-04 MED ORDER — FLUDEOXYGLUCOSE F - 18 (FDG) INJECTION
8.4800 | Freq: Once | INTRAVENOUS | Status: AC | PRN
  Administered 2017-11-04: 8.48 via INTRAVENOUS

## 2017-11-11 ENCOUNTER — Encounter: Payer: Self-pay | Admitting: Hematology and Oncology

## 2017-11-11 ENCOUNTER — Other Ambulatory Visit: Payer: Self-pay | Admitting: Student

## 2017-11-12 ENCOUNTER — Ambulatory Visit (HOSPITAL_COMMUNITY)
Admission: RE | Admit: 2017-11-12 | Discharge: 2017-11-12 | Disposition: A | Discharge status: Home / Self Care | Payer: Medicare HMO | Source: Ambulatory Visit | Attending: Hematology and Oncology | Admitting: Hematology and Oncology

## 2017-11-12 ENCOUNTER — Encounter (HOSPITAL_COMMUNITY): Payer: Self-pay

## 2017-11-12 DIAGNOSIS — E785 Hyperlipidemia, unspecified: Secondary | ICD-10-CM | POA: Diagnosis not present

## 2017-11-12 DIAGNOSIS — Z9221 Personal history of antineoplastic chemotherapy: Secondary | ICD-10-CM | POA: Insufficient documentation

## 2017-11-12 DIAGNOSIS — K7689 Other specified diseases of liver: Secondary | ICD-10-CM | POA: Diagnosis present

## 2017-11-12 DIAGNOSIS — E669 Obesity, unspecified: Secondary | ICD-10-CM | POA: Insufficient documentation

## 2017-11-12 DIAGNOSIS — I700 Atherosclerosis of aorta: Secondary | ICD-10-CM | POA: Insufficient documentation

## 2017-11-12 DIAGNOSIS — E119 Type 2 diabetes mellitus without complications: Secondary | ICD-10-CM | POA: Insufficient documentation

## 2017-11-12 DIAGNOSIS — Z8042 Family history of malignant neoplasm of prostate: Secondary | ICD-10-CM | POA: Insufficient documentation

## 2017-11-12 DIAGNOSIS — I5042 Chronic combined systolic (congestive) and diastolic (congestive) heart failure: Secondary | ICD-10-CM | POA: Diagnosis not present

## 2017-11-12 DIAGNOSIS — Z7722 Contact with and (suspected) exposure to environmental tobacco smoke (acute) (chronic): Secondary | ICD-10-CM | POA: Insufficient documentation

## 2017-11-12 DIAGNOSIS — Z9012 Acquired absence of left breast and nipple: Secondary | ICD-10-CM | POA: Diagnosis not present

## 2017-11-12 DIAGNOSIS — Z79899 Other long term (current) drug therapy: Secondary | ICD-10-CM | POA: Insufficient documentation

## 2017-11-12 DIAGNOSIS — Z881 Allergy status to other antibiotic agents status: Secondary | ICD-10-CM | POA: Insufficient documentation

## 2017-11-12 DIAGNOSIS — I491 Atrial premature depolarization: Secondary | ICD-10-CM | POA: Insufficient documentation

## 2017-11-12 DIAGNOSIS — M8580 Other specified disorders of bone density and structure, unspecified site: Secondary | ICD-10-CM | POA: Diagnosis not present

## 2017-11-12 DIAGNOSIS — Z885 Allergy status to narcotic agent status: Secondary | ICD-10-CM | POA: Diagnosis not present

## 2017-11-12 DIAGNOSIS — Z882 Allergy status to sulfonamides status: Secondary | ICD-10-CM | POA: Diagnosis not present

## 2017-11-12 DIAGNOSIS — Z6825 Body mass index (BMI) 25.0-25.9, adult: Secondary | ICD-10-CM | POA: Diagnosis not present

## 2017-11-12 DIAGNOSIS — I110 Hypertensive heart disease with heart failure: Secondary | ICD-10-CM | POA: Insufficient documentation

## 2017-11-12 DIAGNOSIS — R590 Localized enlarged lymph nodes: Secondary | ICD-10-CM | POA: Diagnosis not present

## 2017-11-12 DIAGNOSIS — K219 Gastro-esophageal reflux disease without esophagitis: Secondary | ICD-10-CM | POA: Insufficient documentation

## 2017-11-12 DIAGNOSIS — Z8249 Family history of ischemic heart disease and other diseases of the circulatory system: Secondary | ICD-10-CM | POA: Insufficient documentation

## 2017-11-12 DIAGNOSIS — C787 Secondary malignant neoplasm of liver and intrahepatic bile duct: Secondary | ICD-10-CM | POA: Diagnosis not present

## 2017-11-12 DIAGNOSIS — F329 Major depressive disorder, single episode, unspecified: Secondary | ICD-10-CM | POA: Diagnosis not present

## 2017-11-12 DIAGNOSIS — Z808 Family history of malignant neoplasm of other organs or systems: Secondary | ICD-10-CM | POA: Insufficient documentation

## 2017-11-12 DIAGNOSIS — K449 Diaphragmatic hernia without obstruction or gangrene: Secondary | ICD-10-CM | POA: Diagnosis not present

## 2017-11-12 DIAGNOSIS — F419 Anxiety disorder, unspecified: Secondary | ICD-10-CM | POA: Insufficient documentation

## 2017-11-12 DIAGNOSIS — Z923 Personal history of irradiation: Secondary | ICD-10-CM | POA: Insufficient documentation

## 2017-11-12 DIAGNOSIS — I420 Dilated cardiomyopathy: Secondary | ICD-10-CM | POA: Diagnosis not present

## 2017-11-12 DIAGNOSIS — C50312 Malignant neoplasm of lower-inner quadrant of left female breast: Secondary | ICD-10-CM | POA: Diagnosis not present

## 2017-11-12 DIAGNOSIS — Z833 Family history of diabetes mellitus: Secondary | ICD-10-CM | POA: Insufficient documentation

## 2017-11-12 DIAGNOSIS — Z794 Long term (current) use of insulin: Secondary | ICD-10-CM | POA: Insufficient documentation

## 2017-11-12 LAB — PROTIME-INR
INR: 0.94
Prothrombin Time: 12.5 seconds (ref 11.4–15.2)

## 2017-11-12 LAB — CBC
HCT: 46.4 % — ABNORMAL HIGH (ref 36.0–46.0)
Hemoglobin: 14.5 g/dL (ref 12.0–15.0)
MCH: 29.3 pg (ref 26.0–34.0)
MCHC: 31.3 g/dL (ref 30.0–36.0)
MCV: 93.7 fL (ref 80.0–100.0)
Platelets: 247 10*3/uL (ref 150–400)
RBC: 4.95 MIL/uL (ref 3.87–5.11)
RDW: 13.6 % (ref 11.5–15.5)
WBC: 5.3 10*3/uL (ref 4.0–10.5)
nRBC: 0 % (ref 0.0–0.2)

## 2017-11-12 LAB — APTT: aPTT: 31 seconds (ref 24–36)

## 2017-11-12 LAB — GLUCOSE, CAPILLARY: GLUCOSE-CAPILLARY: 97 mg/dL (ref 70–99)

## 2017-11-12 MED ORDER — GELATIN ABSORBABLE 12-7 MM EX MISC
CUTANEOUS | Status: AC
  Filled 2017-11-12: qty 1

## 2017-11-12 MED ORDER — MIDAZOLAM HCL 2 MG/2ML IJ SOLN
INTRAMUSCULAR | Status: AC
  Filled 2017-11-12: qty 2

## 2017-11-12 MED ORDER — SODIUM CHLORIDE 0.9 % IV SOLN
INTRAVENOUS | Status: DC
  Administered 2017-11-12: 11:00:00 via INTRAVENOUS

## 2017-11-12 MED ORDER — LIDOCAINE HCL 1 % IJ SOLN
INTRAMUSCULAR | Status: AC
  Filled 2017-11-12: qty 20

## 2017-11-12 MED ORDER — FENTANYL CITRATE (PF) 100 MCG/2ML IJ SOLN
INTRAMUSCULAR | Status: AC
  Filled 2017-11-12: qty 2

## 2017-11-12 MED ORDER — MIDAZOLAM HCL 2 MG/2ML IJ SOLN
INTRAMUSCULAR | Status: AC | PRN
  Administered 2017-11-12 (×2): 0.5 mg via INTRAVENOUS
  Administered 2017-11-12: 1 mg via INTRAVENOUS

## 2017-11-12 MED ORDER — FENTANYL CITRATE (PF) 100 MCG/2ML IJ SOLN
INTRAMUSCULAR | Status: AC | PRN
  Administered 2017-11-12 (×2): 25 ug via INTRAVENOUS

## 2017-11-12 NOTE — Procedures (Signed)
Interventional Radiology Procedure Note  Procedure: US liver biopsy  Complications: None  Estimated Blood Loss: None  Recommendations: - Bedrest x 3 hrs - Path pending   Signed,  Criselda Peaches, MD

## 2017-11-12 NOTE — Consult Note (Signed)
Chief Complaint: Patient was seen in consultation today for image guided liver lesion biopsy   Referring Physician(s): [place]  Supervising Physician: Jacqulynn Cadet  Patient Status: [facility] - Out-pt  History of Present Illness: [name] is a 77 y.o. female with history of left breast carcinoma 2013, status post left mastectomy and chemoradiation.  Recent follow-up imaging/PET scan has revealed:  1. Heavy burden of metastatic disease to the liver involving all lobes. An index 7.7 cm right hepatic lobe mass has a maximum SUV of 16.6. 2. Small but hypermetabolic left supraclavicular, left subpectoral, portacaval, and aortocaval adenopathy indicating malignancy. 3. Other imaging findings of potential clinical significance: Aortic Atherosclerosis (ICD10-I70.0). Left mastectomy. Small type 1 hiatal Hernia.  She presents today for image guided liver lesion biopsy for further evaluation.  Past Medical History:  Diagnosis Date  . ALLERGIC RHINITIS 12/13/2007  . ANXIETY 06/04/2008  . Asthma   . Blood transfusion 1964   post childbirth  . 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 Chronic combined systolic (congestive) and diastolic (congestive) heart failure ([place])   . DEGENERATIVE JOINT DISEASE 12/17/2006  . DEPRESSION 08/10/2007  . Dilated cardiomyopathy ([place]) 05/29/2015   a. Dx 05/2015 - EF 35-40% - felt secondary to XRT and chemo for breast CA. Normal coronaries by cath.  . DM type 2 (diabetes mellitus, type 2) ([place])   . Essential hypertension   . GERD 12/17/2006  . History of chemotherapy   . History of radiation therapy 10/07/11-11/24/11   left breast,5040 cGy/28 sessions,l chest wall boost=1000cGy/5 sesions  . 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 Hyperlipidemia   . OBESITY 06/04/2008  . 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 Premature atrial contractions   . Sinus bradycardia    a. 2018 event monitor which showed NSR with  average HR 71, frequent PACs, occasional PVCs, and sinus bradycardia as low as 50.    Past Surgical History:  Procedure Laterality Date  . bladder tack  1974  . BREAST EXCISIONAL BIOPSY  11/10/1988   left benign Dr Margot Chimes  . BREAST EXCISIONAL BIOPSY  03/04/1983   Right - two areas - Dr Margot Chimes  . BREAST EXCISIONAL BIOPSY  10/09/1980   right - Dr Margot Chimes  . BREAST EXCISIONAL BIOPSY  10/18/1979   left - Dr Margot Chimes  . BREAST EXCISIONAL BIOPSY  01/02/1994   right - Dr Margot Chimes  . BREAST EXCISIONAL BIOPSY  2017   right  . BREAST SURGERY    . CARDIAC CATHETERIZATION N/A 05/29/2015   Procedure: Left Heart Cath and Coronary Angiography;  Surgeon: Belva Crome, MD;  Location: [place];  Service: Cardiovascular;  Laterality: N/A;  . CHOLECYSTECTOMY  02/23/1991   LC - Dr Margot Chimes  . COLONOSCOPY    . ENTEROCELE REPAIR  06/08   Dr. Cletis Media  . EVACUATION BREAST HEMATOMA  07/01/2011   Procedure: EVACUATION HEMATOMA BREAST;  Surgeon: Haywood Lasso, MD;  Location: [facility];  Service: General;  Laterality: Left;  Drainage of left mastectomy seroma  . HIP SURGERY  08/27/2010   DR. Maureen Ralphs  . LAMINECTOMY  1993   s/p-Dr. Rita Ohara  . MASTECTOMY Left 03/23/2011  . MODIFIED RADICAL MASTECTOMY W/ AXILLARY LYMPH NODE DISSECTION Left 03-30-11  . PORT-A-CATH REMOVAL  12/02/2011   Procedure: REMOVAL PORT-A-CATH;  Surgeon: Haywood Lasso, MD;  Location: [facility];  Service: General;  Laterality: N/A;  . PORTACATH PLACEMENT  04/13/2011  Procedure: INSERTION PORT-A-CATH;  Surgeon: Haywood Lasso, MD;  Location: [place];  Service: General;  Laterality: N/A;  porta cath placement,removal of J-P drain  . RADIOACTIVE SEED GUIDED EXCISIONAL BREAST BIOPSY Right 08/09/2015   Procedure: RIGHT RADIOACTIVE SEED GUIDED EXCISIONAL BREAST BIOPSY, RIGHT NIPPLE LESION EXCISION;  Surgeon: Rolm Bookbinder, MD;  Location: [facility];  Service: General;  Laterality:  Right;  . ROTATOR CUFF REPAIR  8/03   right, Dr. Tonita Cong  . ROTATOR CUFF REPAIR  10/26/2008   left, Dr. Rush Farmer  . TONSILLECTOMY    . VAGINAL HYSTERECTOMY  1974   partial     Allergies: Oxycodone-acetaminophen; Cephalexin; Clindamycin; Dilaudid [hydromorphone hcl]; Oxycodone-acetaminophen; Rocephin [ceftriaxone sodium in dextrose]; and Sulfonamide derivatives  Medications: Prior to Admission medications   Medication Sig Start Date End Date Taking? Authorizing Provider  acyclovir (ZOVIRAX) 200 MG capsule TAKE 2 CAPSULES BY MOUTH 3 TIMES DAILY 10/23/13  Yes Noralee Space, MD  albuterol (PROVENTIL) (2.5 MG/3ML) 0.083% nebulizer solution Take 2.5 mg by nebulization every 6 (six) hours as needed for shortness of breath.  02/05/14  Yes [provider]  ALPRAZolam Duanne Moron) 0.5 MG tablet TAKE ONE-HALF TO ONE TABLET BY MOUTH THREE TIMES DAILY AS NEEDED FOR  NERVES 02/23/13  Yes Noralee Space, MD  atorvastatin (LIPITOR) 40 MG tablet Take 40 mg by mouth daily.   Yes [provider]  carvedilol (COREG) 6.25 MG tablet TAKE 1 TABLET TWICE DAILY WITH A MEAL 05/27/17  Yes Turner, Eber Hong, MD  cholecalciferol (VITAMIN D) 1000 UNITS tablet Take 1 tablet (1,000 Units total) by mouth daily. 02/23/13  Yes Noralee Space, MD  cyanocobalamin 500 MCG tablet Take 1 tablet (500 mcg total) by mouth daily. 02/23/13  Yes Noralee Space, MD  Insulin Pen Needle (RELION PEN NEEDLE 31G/8MM) 31G X 8 MM MISC 1 Device by Does not apply route 4 (four) times daily. 10/05/12  Yes Renato Shin, MD  letrozole [facility]) 2.5 MG tablet TAKE ONE TABLET BY MOUTH DAILY. 04/29/17  Yes Nicholas Lose, MD  Liraglutide (VICTOZA) 18 MG/3ML SOPN Inject 3 mLs (18 mg total) into the skin every morning. 04/18/15  Yes Nicholas Lose, MD  losartan (COZAAR) 100 MG tablet Take 1 tablet (100 mg total) by mouth daily. 02/23/13  Yes Noralee Space, MD  magnesium oxide (MAG-OX) 400 MG tablet Take 1 tablet (400 mg total) by mouth daily. 06/11/16  Yes  Dunn, Dayna N, PA-C  Multiple Vitamins-Minerals (ONE-A-DAY WOMENS 50+ ADVANTAGE) TABS Take 1 tablet by mouth daily. 02/23/13  Yes Noralee Space, MD  omeprazole (PRILOSEC) 40 MG capsule Take 40 mg by mouth daily.   Yes [provider]  sertraline (ZOLOFT) 50 MG tablet Take 1 tablet (50 mg total) by mouth daily. 02/23/13  Yes Noralee Space, MD  spironolactone (ALDACTONE) 25 MG tablet TAKE 1 TABLET BY MOUTH EVERY DAY 07/31/16  Yes Turner, Traci R, MD  tolterodine (DETROL LA) 4 MG 24 hr capsule Take 1 capsule by mouth daily. 06/09/16  Yes [provider]  fluconazole (DIFLUCAN) 100 MG tablet Take 1 tablet (100 mg total) by mouth daily. 05/11/17   Nicholas Lose, MD  prochlorperazine (COMPAZINE) 10 MG tablet Take 1 tablet (10 mg total) by mouth every 6 (six) hours as needed (Nausea or vomiting). 07/14/11 07/14/11  Marcy Panning, MD     Family History  Problem Relation Age of Onset  . COPD Mother   . Arthritis Mother   .  Emphysema Mother   . Diabetes Brother   . Cancer Brother        prostate  . Cancer Father        malignant brain tumor  . Mental illness Paternal Grandfather   . Heart disease Other        Sibling  . Diabetes Other        Sibling     Social History   Socioeconomic History  . Marital status: Married    Spouse name: Not on file  . Number of children: 3  . Years of education: Not on file  . Highest education level: Not on file  Occupational History    Employer: RETIRED  Social Needs  . Financial resource strain: Not on file  . Food insecurity:    Worry: Not on file    Inability: Not on file  . Transportation needs:    Medical: Not on file    Non-medical: Not on file  Tobacco Use  . Smoking status: Never Smoker  . Smokeless tobacco: Never Used  Substance and Sexual Activity  . Alcohol use: No  . Drug use: No  . Sexual activity: Yes  Lifestyle  . Physical activity:    Days per week: Not on file    Minutes per session: Not on file  . Stress: Not  on file  Relationships  . Social connections:    Talks on phone: Not on file    Gets together: Not on file    Attends religious service: Not on file    Active member of club or organization: Not on file    Attends meetings of clubs or organizations: Not on file    Relationship status: Not on file  Other Topics Concern  . 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  She has had weight loss, epigastric discomfort, occasional hot flashes  Vital Signs: BP 139/88 (BP Location: Left Arm)   Pulse 84   Temp 98.4 F (36.9 C) (Oral)   Resp 18   Ht 5\' 8"  (1.727 m)   Wt 166 lb (75.3 kg)   SpO2 93%   BMI 25.24 kg/m   Physical Exam awake, alert.  Chest clear to auscultation bilaterally.  Heart with normal rate, occasional ectopy.  Abdomen soft, positive bowel sounds, mild epigastric tenderness to palpation.  No lower extremity edema.  Imaging: Nm Pet Image Restag (ps) Skull Base To Thigh  Result Date: 11/04/2017 CLINICAL DATA:  Subsequent treatment strategy for breast cancer. EXAM: NUCLEAR MEDICINE PET SKULL BASE TO THIGH TECHNIQUE: 8.5 mCi F-18 FDG was injected intravenously. Full-ring PET imaging was performed from the skull base to thigh after the radiotracer. CT data was obtained and used for attenuation correction and anatomic localization. Fasting blood glucose: 106 mg/dl COMPARISON:  Multiple exams, including prior PET-CT from 04/09/2011 FINDINGS: Mediastinal blood pool activity: SUV max 2.6 NECK: No significant abnormal hypermetabolic activity in this region. Incidental CT findings: Common carotid atherosclerotic calcification. CHEST: Indistinctly marginated subpectoral lymph node measuring about 8 mm in short axis  on image 54/4 with maximum SUV 5.8. Subtle supraclavicular activity potentially associated with a 5 mm lymph node on  image 46/4, maximum SUV 4.8. Incidental CT findings: Left mastectomy. Small type 1 hiatal hernia. ABDOMEN/PELVIS: Widespread metastatic disease to the liver with heavy tumor burden in all lobes. An index hypodense right hepatic lobe mass measures 7.7 by 5.4 cm on image 100/4 with maximum SUV of 16.6. A portacaval lymph node measuring 0.9 cm in short axis has a maximum SUV of 5.4. New 1.0 by 1.1 cm right adrenal nodule, maximum SUV 5.2. No discrete left adrenal mass, left adrenal maximum SUV 3.7. Aortocaval lymph node measuring 0.9 cm in short axis on image 126/4 has a maximum SUV of 9.2. Incidental CT findings: Aortoiliac atherosclerotic vascular disease. Cholecystectomy. SKELETON: No significant abnormal hypermetabolic activity in this region. Incidental CT findings: Degenerative glenohumeral arthropathy, left greater than right. IMPRESSION: 1. Heavy burden of metastatic disease to the liver involving all lobes. An index 7.7 cm right hepatic lobe mass has a maximum SUV of 16.6. 2. Small but hypermetabolic left supraclavicular, left subpectoral, portacaval, and aortocaval adenopathy indicating malignancy. 3. Other imaging findings of potential clinical significance: Aortic Atherosclerosis (ICD10-I70.0). Left mastectomy. Small type 1 hiatal hernia. Electronically Signed   By: Van Clines M.D.   On: 11/04/2017 09:44    Labs:  CBC: Recent Labs    11/12/17 1129  WBC 5.3  HGB 14.5  HCT 46.4*  PLT 247    COAGS: Recent Labs    11/12/17 1129  INR 0.94  APTT 31    BMP: No results for input(s): NA, K, CL, CO2, GLUCOSE, BUN, CALCIUM, CREATININE, GFRNONAA, GFRAA in the last 8760 hours.  Invalid input(s): CMP  LIVER FUNCTION TESTS: No results for input(s): BILITOT, AST, ALT, ALKPHOS, PROT, ALBUMIN in the last 8760 hours.  TUMOR MARKERS: No results for input(s): AFPTM, CEA,  CA199, CHROMGRNA in the last 8760 hours.  Assessment and Plan: 77 y.o. female with history of left breast carcinoma 2013, status post left mastectomy and chemoradiation.  Recent follow-up imaging/PET scan has revealed:  1. Heavy burden of metastatic disease to the liver involving all lobes. An index 7.7 cm right hepatic lobe mass has a maximum SUV of 16.6. 2. Small but hypermetabolic left supraclavicular, left subpectoral, portacaval, and aortocaval adenopathy indicating malignancy. 3. Other imaging findings of potential clinical significance: Aortic Atherosclerosis (ICD10-I70.0). Left mastectomy. Small type 1 hiatal Hernia.  She presents today for image guided liver lesion biopsy for further evaluation.Risks and benefits discussed with the patient/spouse including, but not limited to bleeding, infection, damage to adjacent structures or low yield requiring additional tests.  All of the patient's questions were answered, patient is agreeable to proceed. Consent signed and in chart.     Thank you for this interesting consult.  I greatly enjoyed meeting ASMA BOLDON and look forward to participating in their care.  A copy of this report was sent to the requesting provider on this date.  Electronically Signed: D. Rowe Robert, PA-C 11/12/2017, 12:03 PM   I spent a total of  25 minutes   in face to face in clinical consultation, greater than 50% of which was counseling/coordinating care for image guided liver lesion biopsy

## 2017-11-12 NOTE — Discharge Instructions (Signed)
Liver Biopsy, Care After °These instructions give you information on caring for yourself after your procedure. Your doctor may also give you more specific instructions. Call your doctor if you have any problems or questions after your procedure. °Follow these instructions at home: °· Rest at home for 1-2 days or as told by your doctor. °· Have someone stay with you for at least 24 hours. °· Do not do these things in the first 24 hours: °? Drive. °? Use machinery. °? Take care of other people. °? Sign legal documents. °? Take a bath or shower. °· There are many different ways to close and cover a cut (incision). For example, a cut can be closed with stitches, skin glue, or adhesive strips. Follow your doctor's instructions on: °? Taking care of your cut. °? Changing and removing your bandage (dressing). °? Removing whatever was used to close your cut. °· Do not drink alcohol in the first week. °· Do not lift more than 5 pounds or play contact sports for the first 2 weeks. °· Take medicines only as told by your doctor. For 1 week, do not take medicine that has aspirin in it or medicines like ibuprofen. °· Get your test results. °Contact a doctor if: °· A cut bleeds and leaves more than just a small spot of blood. °· A cut is red, puffs up (swells), or hurts more than before. °· Fluid or something else comes from a cut. °· A cut smells bad. °· You have a fever or chills. °Get help right away if: °· You have swelling, bloating, or pain in your belly (abdomen). °· You get dizzy or faint. °· You have a rash. °· You feel sick to your stomach (nauseous) or throw up (vomit). °· You have trouble breathing, feel short of breath, or feel faint. °· Your chest hurts. °· You have problems talking or seeing. °· You have trouble balancing or moving your arms or legs. °This information is not intended to replace advice given to you by your health care provider. Make sure you discuss any questions you have with your health care  provider. °Document Released: 10/29/2007 Document Revised: 06/27/2015 Document Reviewed: 03/17/2013 °Elsevier Interactive Patient Education © 2018 [employer] ° ° ° ° °Moderate Conscious Sedation, Adult, Care After °These instructions provide you with information about caring for yourself after your procedure. Your health care provider may also give you more specific instructions. Your treatment has been planned according to current medical practices, but problems sometimes occur. Call your health care provider if you have any problems or questions after your procedure. °What can I expect after the procedure? °After your procedure, it is common: °· To feel sleepy for several hours. °· To feel clumsy and have poor balance for several hours. °· To have poor judgment for several hours. °· To vomit if you eat too soon. ° °Follow these instructions at home: °For at least 24 hours after the procedure: ° °· Do not: °? Participate in activities where you could fall or become injured. °? Drive. °? Use heavy machinery. °? Drink alcohol. °? Take sleeping pills or medicines that cause drowsiness. °? Make important decisions or sign legal documents. °? Take care of children on your own. °· Rest. °Eating and drinking °· Follow the diet recommended by your health care provider. °· If you vomit: °? Drink water, juice, or soup when you can drink without vomiting. °? Make sure you have little or no nausea before eating solid foods. °General instructions °·   Have a responsible adult stay with you until you are awake and alert. °· Take over-the-counter and prescription medicines only as told by your health care provider. °· If you smoke, do not smoke without supervision. °· Keep all follow-up visits as told by your health care provider. This is important. °Contact a health care provider if: °· You keep feeling nauseous or you keep vomiting. °· You feel light-headed. °· You develop a rash. °· You have a fever. °Get help right away  if: °· You have trouble breathing. °This information is not intended to replace advice given to you by your health care provider. Make sure you discuss any questions you have with your health care provider. °Document Released: 11/09/2012 Document Revised: 06/24/2015 Document Reviewed: 05/11/2015 °Elsevier Interactive Patient Education © 2018 [employer]vier Inc. ° ° °

## 2017-11-15 ENCOUNTER — Other Ambulatory Visit: Payer: Self-pay

## 2017-11-15 ENCOUNTER — Telehealth: Payer: Self-pay

## 2017-11-15 ENCOUNTER — Emergency Department (HOSPITAL_COMMUNITY)
Admission: EM | Admit: 2017-11-15 | Discharge: 2017-11-15 | Disposition: A | Discharge status: Home / Self Care | Payer: Medicare HMO | Attending: Emergency Medicine | Admitting: Emergency Medicine

## 2017-11-15 DIAGNOSIS — I10 Essential (primary) hypertension: Secondary | ICD-10-CM | POA: Diagnosis not present

## 2017-11-15 DIAGNOSIS — R1031 Right lower quadrant pain: Secondary | ICD-10-CM | POA: Diagnosis present

## 2017-11-15 DIAGNOSIS — R1011 Right upper quadrant pain: Secondary | ICD-10-CM

## 2017-11-15 DIAGNOSIS — C799 Secondary malignant neoplasm of unspecified site: Secondary | ICD-10-CM | POA: Insufficient documentation

## 2017-11-15 DIAGNOSIS — J45909 Unspecified asthma, uncomplicated: Secondary | ICD-10-CM | POA: Insufficient documentation

## 2017-11-15 DIAGNOSIS — Z853 Personal history of malignant neoplasm of breast: Secondary | ICD-10-CM | POA: Diagnosis not present

## 2017-11-15 DIAGNOSIS — Z79899 Other long term (current) drug therapy: Secondary | ICD-10-CM | POA: Insufficient documentation

## 2017-11-15 DIAGNOSIS — E119 Type 2 diabetes mellitus without complications: Secondary | ICD-10-CM | POA: Insufficient documentation

## 2017-11-15 LAB — BASIC METABOLIC PANEL
Anion gap: 11 (ref 5–15)
BUN: 20 mg/dL (ref 8–23)
CHLORIDE: 106 mmol/L (ref 98–111)
CO2: 24 mmol/L (ref 22–32)
CREATININE: 0.75 mg/dL (ref 0.44–1.00)
Calcium: 9.9 mg/dL (ref 8.9–10.3)
GFR calc non Af Amer: >60 mL/min (ref >60)
Glucose, Bld: 133 mg/dL — ABNORMAL HIGH (ref 70–99)
POTASSIUM: 4.1 mmol/L (ref 3.5–5.1)
SODIUM: 141 mmol/L (ref 135–145)

## 2017-11-15 LAB — CBC WITH DIFFERENTIAL/PLATELET
Abs Immature Granulocytes: 0.03 10*3/uL (ref 0.00–0.07)
BASOS ABS: 0.1 10*3/uL (ref 0.0–0.1)
Basophils Relative: 1 %
EOS PCT: 1 %
Eosinophils Absolute: 0.1 10*3/uL (ref 0.0–0.5)
HCT: 43.6 % (ref 36.0–46.0)
Hemoglobin: 13.8 g/dL (ref 12.0–15.0)
Immature Granulocytes: 0 %
LYMPHS PCT: 20 %
Lymphs Abs: 1.5 10*3/uL (ref 0.7–4.0)
MCH: 29.7 pg (ref 26.0–34.0)
MCHC: 31.7 g/dL (ref 30.0–36.0)
MCV: 93.8 fL (ref 80.0–100.0)
Monocytes Absolute: 0.8 10*3/uL (ref 0.1–1.0)
Monocytes Relative: 11 %
NRBC: 0 % (ref 0.0–0.2)
Neutro Abs: 4.8 10*3/uL (ref 1.7–7.7)
Neutrophils Relative %: 67 %
PLATELETS: 224 10*3/uL (ref 150–400)
RBC: 4.65 MIL/uL (ref 3.87–5.11)
RDW: 13.5 % (ref 11.5–15.5)
WBC: 7.2 10*3/uL (ref 4.0–10.5)

## 2017-11-15 LAB — PROTIME-INR
INR: 0.92
PROTHROMBIN TIME: 12.2 s (ref 11.4–15.2)

## 2017-11-15 MED ORDER — HYDROCODONE-ACETAMINOPHEN 5-325 MG PO TABS
1.0000 | ORAL_TABLET | Freq: Once | ORAL | Status: AC
  Administered 2017-11-15: 1 via ORAL
  Filled 2017-11-15: qty 1

## 2017-11-15 MED ORDER — HYDROCODONE-ACETAMINOPHEN 5-325 MG PO TABS: 1.0000 | ORAL_TABLET | ORAL | 0 refills | Status: DC | PRN

## 2017-11-15 NOTE — Discharge Instructions (Signed)
Testing today is reassuring.  We are sending a prescription for pain reliever to your pharmacy to use as needed.  Follow-up with your oncologist for further care and treatment as needed and as scheduled.

## 2017-11-15 NOTE — ED Notes (Signed)
Pt given Ginger Ale and [place] sandwich

## 2017-11-15 NOTE — ED Provider Notes (Signed)
[facility] DEPT Provider Note   CSN: [id number] Arrival date & time: 11/15/17  1117     History   Chief Complaint Chief Complaint  Patient presents with  . Abdominal Pain    HPI [name] is a 77 y.o. female.  HPI   Resents for evaluation of right upper quadrant abdominal pain radiating to the right lower quadrant, starting this morning.  No prior similar problems.  She had a biopsy, liver, 3 days ago to evaluate for suspected cancer metastatic to liver.  There were no complications initially after the biopsy.  She has seen her oncologist, and had a PET scan but not had any initiation of treatment yet.  She denies fever, chills, nausea, vomiting, weakness or paresthesia.  He is tried Tylenol without relief of her pain.  There are no other known modifying factors.  Past Medical History:  Diagnosis Date  . ALLERGIC RHINITIS 12/13/2007  . ANXIETY 06/04/2008  . Asthma   . Blood transfusion 1964   post childbirth  . 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 Chronic combined systolic (congestive) and diastolic (congestive) heart failure ([place])   . DEGENERATIVE JOINT DISEASE 12/17/2006  . DEPRESSION 08/10/2007  . Dilated cardiomyopathy ([place]) 05/29/2015   a. Dx 05/2015 - EF 35-40% - felt secondary to XRT and chemo for breast CA. Normal coronaries by cath.  . DM type 2 (diabetes mellitus, type 2) ([place])   . Essential hypertension   . GERD 12/17/2006  . History of chemotherapy   . History of radiation therapy 10/07/11-11/24/11   left breast,5040 cGy/28 sessions,l chest wall boost=1000cGy/5 sesions  . 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 Hyperlipidemia   . OBESITY 06/04/2008  . 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 Premature atrial contractions   . Sinus bradycardia    a. 2018 event monitor which showed NSR with average HR 71, frequent PACs, occasional PVCs, and sinus bradycardia as low as 50.    Patient Active  Problem List   Diagnosis Date Noted  . PVC's (premature ventricular contractions) 05/28/2016  . NICM (nonischemic cardiomyopathy) ([place]) 05/28/2016  . Premature atrial contractions 04/21/2016  . Chronic combined systolic and diastolic CHF (congestive heart failure) ([place]) 07/17/2015  . Dilated cardiomyopathy ([place]) 05/29/2015  . Recurrent major depressive disorder ([place]) 02/28/2015  . Acute bronchitis 12/19/2014  . Acid reflux 06/20/2014  . Herpes 06/20/2014  . 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 H/O malignant neoplasm of breast 03/15/2013  . HLD (hyperlipidemia) 03/15/2013  . Long term current use of insulin ([place]) 03/15/2013  . 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 Controlled type 2 diabetes mellitus without complication ([place]) 95/10/3265  . Mixed incontinence 03/15/2013  . Macular degeneration 03/02/2013  . LBP (low back pain) 02/23/2013  . Open wound of left breast 05/22/2011  . Primary cancer of lower-inner quadrant of left female breast ([place]) 02/20/2011  . DM type 2 (diabetes mellitus, type 2) ([place]) 08/10/2007  . DEPRESSION 08/10/2007  . HYPERCHOLESTEROLEMIA 12/17/2006  . Essential hypertension 12/17/2006  . GERD 12/17/2006    Past Surgical History:  Procedure Laterality Date  . bladder tack  1974  . BREAST EXCISIONAL BIOPSY  11/10/1988   left benign Dr Margot Chimes  . BREAST EXCISIONAL BIOPSY  03/04/1983   Right - two areas - Dr Margot Chimes  . BREAST EXCISIONAL BIOPSY  10/09/1980   right - Dr Margot Chimes  . BREAST EXCISIONAL BIOPSY  10/18/1979  left - Dr Margot Chimes  . BREAST EXCISIONAL BIOPSY  01/02/1994   right - Dr Margot Chimes  . BREAST EXCISIONAL BIOPSY  2017   right  . BREAST SURGERY    . CARDIAC CATHETERIZATION N/A 05/29/2015   Procedure: Left Heart Cath and Coronary Angiography;  Surgeon: Belva Crome, MD;  Location: [facility];  Service: Cardiovascular;  Laterality: N/A;  . CHOLECYSTECTOMY  02/23/1991   LC - Dr Margot Chimes  . COLONOSCOPY    . ENTEROCELE REPAIR  06/08     Dr. Cletis Media  . EVACUATION BREAST HEMATOMA  07/01/2011   Procedure: EVACUATION HEMATOMA BREAST;  Surgeon: Haywood Lasso, MD;  Location: [facility];  Service: General;  Laterality: Left;  Drainage of left mastectomy seroma  . HIP SURGERY  08/27/2010   DR. Maureen Ralphs  . LAMINECTOMY  1993   s/p-Dr. Rita Ohara  . MASTECTOMY Left 03/23/2011  . MODIFIED RADICAL MASTECTOMY W/ AXILLARY LYMPH NODE DISSECTION Left 03-30-11  . PORT-A-CATH REMOVAL  12/02/2011   Procedure: REMOVAL PORT-A-CATH;  Surgeon: Haywood Lasso, MD;  Location: [facility];  Service: General;  Laterality: N/A;  . PORTACATH PLACEMENT  04/13/2011   Procedure: INSERTION PORT-A-CATH;  Surgeon: Haywood Lasso, MD;  Location: [facility];  Service: General;  Laterality: N/A;  porta cath placement,removal of J-P drain  . RADIOACTIVE SEED GUIDED EXCISIONAL BREAST BIOPSY Right 08/09/2015   Procedure: RIGHT RADIOACTIVE SEED GUIDED EXCISIONAL BREAST BIOPSY, RIGHT NIPPLE LESION EXCISION;  Surgeon: Rolm Bookbinder, MD;  Location: [facility];  Service: General;  Laterality: Right;  . ROTATOR CUFF REPAIR  8/03   right, Dr. Tonita Cong  . ROTATOR CUFF REPAIR  10/26/2008   left, Dr. Rush Farmer  . TONSILLECTOMY    . VAGINAL HYSTERECTOMY  1974   partial      OB History    Gravida  5   Para  3   Term      Preterm      AB      Living  3     SAB      TAB      Ectopic      Multiple      Live Births               Home Medications    Prior to Admission medications   Medication Sig Start Date End Date Taking? Authorizing Provider  acyclovir (ZOVIRAX) 200 MG capsule TAKE 2 CAPSULES BY MOUTH 3 TIMES DAILY Patient taking differently: Take 400 mg by mouth 3 (three) times daily.  10/23/13  Yes Noralee Space, MD  atorvastatin (LIPITOR) 40 MG tablet Take 40 mg by mouth daily.   Yes [provider]  carvedilol (COREG) 6.25 MG tablet TAKE 1 TABLET TWICE DAILY WITH A MEAL Patient taking  differently: Take 6.25 mg by mouth 2 (two) times daily with a meal.  05/27/17  Yes Turner, Traci R, MD  celecoxib (CELEBREX) 200 MG capsule Take 200 mg by mouth daily as needed for pain. And inflammation 10/26/17  Yes [provider]  cholecalciferol (VITAMIN D) 1000 UNITS tablet Take 1 tablet (1,000 Units total) by mouth daily. 02/23/13  Yes Noralee Space, MD  cyanocobalamin 500 MCG tablet Take 1 tablet (500 mcg total) by mouth daily. 02/23/13  Yes Noralee Space, MD  letrozole (FEMARA) 2.5 MG tablet TAKE ONE TABLET BY MOUTH DAILY. 04/29/17  Yes Nicholas Lose, MD  Liraglutide (VICTOZA) 18 MG/3ML SOPN Inject 3 mLs (18  mg total) into the skin every morning. Patient taking differently: Inject 1.8 mg into the skin every morning.  04/18/15  Yes Nicholas Lose, MD  losartan (COZAAR) 100 MG tablet Take 1 tablet (100 mg total) by mouth daily. 02/23/13  Yes Noralee Space, MD  magnesium oxide (MAG-OX) 400 MG tablet Take 1 tablet (400 mg total) by mouth daily. 06/11/16  Yes Dunn, Dayna N, PA-C  Multiple Vitamins-Minerals (ONE-A-DAY WOMENS 50+ ADVANTAGE) TABS Take 1 tablet by mouth daily. 02/23/13  Yes Noralee Space, MD  omeprazole (PRILOSEC) 40 MG capsule Take 40 mg by mouth daily.   Yes [provider]  sertraline (ZOLOFT) 50 MG tablet Take 1 tablet (50 mg total) by mouth daily. 02/23/13  Yes Noralee Space, MD  spironolactone (ALDACTONE) 25 MG tablet TAKE 1 TABLET BY MOUTH EVERY DAY 07/31/16  Yes Turner, Traci R, MD  tolterodine (DETROL LA) 4 MG 24 hr capsule Take 1 capsule by mouth daily. 06/09/16  Yes [provider]  ALPRAZolam Duanne Moron) 0.5 MG tablet TAKE ONE-HALF TO ONE TABLET BY MOUTH THREE TIMES DAILY AS NEEDED FOR  NERVES 02/23/13   Noralee Space, MD  fluconazole (DIFLUCAN) 100 MG tablet Take 1 tablet (100 mg total) by mouth daily. Patient not taking: Reported on 11/15/2017 05/11/17   Nicholas Lose, MD  HYDROcodone-acetaminophen (NORCO) 5-325 MG tablet Take 1 tablet by mouth every 4  (four) hours as needed. 11/15/17   Daleen Bo, MD  Insulin Pen Needle (RELION PEN NEEDLE 31G/8MM) 31G X 8 MM MISC 1 Device by Does not apply route 4 (four) times daily. 10/05/12   Renato Shin, MD  prochlorperazine (COMPAZINE) 10 MG tablet Take 1 tablet (10 mg total) by mouth every 6 (six) hours as needed (Nausea or vomiting). 07/14/11 07/14/11  Marcy Panning, MD    Family History Family History  Problem Relation Age of Onset  . COPD Mother   . Arthritis Mother   . Emphysema Mother   . Diabetes Brother   . Cancer Brother        prostate  . Cancer Father        malignant brain tumor  . Mental illness Paternal Grandfather   . Heart disease Other        Sibling  . Diabetes Other        Sibling     Social History Social History   Tobacco Use  . Smoking status: Never Smoker  . Smokeless tobacco: Never Used  Substance Use Topics  . Alcohol use: No  . Drug use: No     Allergies   Oxycodone-acetaminophen; Cephalexin; Clindamycin; Dilaudid [hydromorphone hcl]; Oxycodone-acetaminophen; Rocephin [ceftriaxone sodium in dextrose]; and Sulfonamide derivatives   Review of Systems Review of Systems  All other systems reviewed and are negative.    Physical Exam Updated Vital Signs BP 119/81   Pulse 62   Temp 98.2 F (36.8 C)   Resp 17   SpO2 96%   Physical Exam  Constitutional: She is oriented to person, place, and time. She appears well-developed. She does not appear ill.  Elderly, frail  HENT:  Head: Normocephalic and atraumatic.  Right Ear: External ear normal.  Left Ear: External ear normal.  Eyes: Pupils are equal, round, and reactive to light. Conjunctivae and EOM are normal.  Neck: Normal range of motion and phonation normal. Neck supple.  Cardiovascular: Normal rate, regular rhythm and normal heart sounds.  Pulmonary/Chest: Effort normal and breath sounds normal. She exhibits no bony tenderness.  Abdominal: Soft. There is tenderness (Right upper quadrant  moderate, with palpable nodularity of the liver.).  Musculoskeletal: Normal range of motion. She exhibits no edema or deformity.  Neurological: She is alert and oriented to person, place, and time. No cranial nerve deficit or sensory deficit. She exhibits normal muscle tone. Coordination normal.  Skin: Skin is warm, dry and intact.  Psychiatric: She has a normal mood and affect. Her behavior is normal. Judgment and thought content normal.  Nursing note and vitals reviewed.    ED Treatments / Results  Labs (all labs ordered are listed, but only abnormal results are displayed) Labs Reviewed  BASIC METABOLIC PANEL - Abnormal; Notable for the following components:      Result Value   Glucose, Bld 133 (*)    All other components within normal limits  CBC WITH DIFFERENTIAL/PLATELET  PROTIME-INR    EKG None  Radiology No results found.  Procedures Procedures (including critical care time)  Medications Ordered in ED Medications  HYDROcodone-acetaminophen (NORCO/VICODIN) 5-325 MG per tablet 1 tablet (has no administration in time range)     Initial Impression / Assessment and Plan / ED Course  I have reviewed the triage vital signs and the nursing notes.  Pertinent labs & imaging results that were available during my care of the patient were reviewed by me and considered in my medical decision making (see chart for details).  Clinical Course as of Nov 16 1415  Mon Nov 15, 2017  4540 Basic metabolic panel(!) [EW]  9811 Normal  CBC with Differential [EW]  1357 Normal  Protime-INR [EW]    Clinical Course User Index [EW] Daleen Bo, MD     Patient Vitals for the past 24 hrs:  BP Temp Pulse Resp SpO2  11/15/17 1300 119/81 - 62 17 96 %  11/15/17 1130 (!) 133/103 98.2 F (36.8 C) 83 17 95 %  11/15/17 1129 - - - - 96 %    2:13 PM Reevaluation with update and discussion. After initial assessment and treatment, an updated evaluation reveals no change in clinical status.   Findings discussed with the patient and her husband, all questions were answered. Daleen Bo   Medical Decision Making: Abdominal pain with liver metastases.  Doubt complication of liver biopsy 3 days ago, including bleeding.  Doubt serious bacterial infection, metabolic instability or impending vascular collapse.  CRITICAL CARE-no Performed by: Daleen Bo   Nursing Notes Reviewed/ Care Coordinated Applicable Imaging Reviewed Interpretation of Laboratory Data incorporated into ED treatment  The patient appears reasonably screened and/or stabilized for discharge and I doubt any other medical condition or other [facility] requiring further screening, evaluation, or treatment in the ED at this time prior to discharge.  Plan: Home Medications-continue current medications; Home Treatments-rest, fluids; return here if the recommended treatment, does not improve the symptoms; Recommended follow up-oncology follow-up as scheduled, and as needed.  Final Clinical Impressions(s) / ED Diagnoses   Final diagnoses:  Metastatic cancer ([place])  Right upper quadrant abdominal pain    ED Discharge Orders         Ordered    HYDROcodone-acetaminophen (NORCO) 5-325 MG tablet  Every 4 hours PRN     11/15/17 1416           Daleen Bo, MD 11/15/17 1417

## 2017-11-15 NOTE — ED Notes (Signed)
Family at bedside. 

## 2017-11-15 NOTE — ED Triage Notes (Addendum)
Pt BIB EMS from home.  Reports recent diagnosis of Liver cancer, Hx of Breast cancer.  Pt reports having a liver biopsy of Friday.  Right sided flank pain started this morning. EMS gave 100 mcg of fentanyl appx 1115.  Pt reports taking 2 tylenol 0545, 2 more at 1000.    Pt reports her Oncologist is Dr. Lindi Adie, Dr Amelia Jo performed Biopsy on Friday.

## 2017-11-15 NOTE — Telephone Encounter (Signed)
Patient left voicemail stating, "I'm in severe pain and heading to [facility] Emergency Department."    Nurse returned call, left voicemail with contact information.

## 2017-11-15 NOTE — ED Notes (Signed)
Bed: WA12 Expected date:  Expected time:  Means of arrival:  Comments: EMS abdominal pain 

## 2017-11-18 ENCOUNTER — Encounter: Payer: Self-pay | Admitting: Medical Oncology

## 2017-11-18 ENCOUNTER — Telehealth: Payer: Self-pay | Admitting: Hematology and Oncology

## 2017-11-18 ENCOUNTER — Inpatient Hospital Stay: Payer: Medicare HMO | Attending: Hematology and Oncology | Admitting: Hematology and Oncology

## 2017-11-18 DIAGNOSIS — Z9012 Acquired absence of left breast and nipple: Secondary | ICD-10-CM | POA: Diagnosis not present

## 2017-11-18 DIAGNOSIS — Z923 Personal history of irradiation: Secondary | ICD-10-CM | POA: Diagnosis not present

## 2017-11-18 DIAGNOSIS — C50312 Malignant neoplasm of lower-inner quadrant of left female breast: Secondary | ICD-10-CM | POA: Diagnosis not present

## 2017-11-18 DIAGNOSIS — R110 Nausea: Secondary | ICD-10-CM | POA: Insufficient documentation

## 2017-11-18 DIAGNOSIS — Z79899 Other long term (current) drug therapy: Secondary | ICD-10-CM | POA: Diagnosis not present

## 2017-11-18 DIAGNOSIS — C787 Secondary malignant neoplasm of liver and intrahepatic bile duct: Secondary | ICD-10-CM

## 2017-11-18 DIAGNOSIS — Z5111 Encounter for antineoplastic chemotherapy: Secondary | ICD-10-CM | POA: Insufficient documentation

## 2017-11-18 DIAGNOSIS — Z79811 Long term (current) use of aromatase inhibitors: Secondary | ICD-10-CM | POA: Diagnosis not present

## 2017-11-18 DIAGNOSIS — B370 Candidal stomatitis: Secondary | ICD-10-CM | POA: Diagnosis not present

## 2017-11-18 DIAGNOSIS — Z170 Estrogen receptor positive status [ER+]: Secondary | ICD-10-CM | POA: Diagnosis not present

## 2017-11-18 DIAGNOSIS — R5383 Other fatigue: Secondary | ICD-10-CM | POA: Insufficient documentation

## 2017-11-18 NOTE — Telephone Encounter (Signed)
No 10/17 los orders.  °

## 2017-11-18 NOTE — Progress Notes (Unsigned)
Pt request for [employer] faxed to [phone].

## 2017-11-18 NOTE — Progress Notes (Signed)
Patient Care Team: Vicenta Aly, FNP as PCP - General (Nurse Practitioner) Neldon Mc, MD as Surgeon (General Surgery) Sueanne Margarita, MD as Consulting Physician (Cardiology)  DIAGNOSIS:  Encounter Diagnosis  Name Primary?  . Primary cancer of lower-inner quadrant of left female breast ([place])     SUMMARY OF ONCOLOGIC HISTORY:   Primary cancer of lower-inner quadrant of left female breast ([place])   02/20/2011 Initial Diagnosis    Left Breast invasive lobular carcinoma; 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 Her clinical staging was stage IIB (T3, N0, M0).      03/23/2011 Surgery    L mastectomy + Axillary LND 6 cm ILC with LVI, PNI; 9/16 LN positive with Extracapsular ext ER 100%; PR 100%; Her 2 1.31; Ki 67: 18; pT3a N2a (stage IIIA)    04/17/2011 - 06/19/2011 Chemotherapy    FEC X 4 complicated by febrile neutropenis, spesis and chest wall abscess    10/07/2011 - 11/24/2011 Radiation Therapy    Left chest wall and axilla 5040 cGy in 28 # with Boost    10/16/2011 -  Anti-estrogen oral therapy    Letrozole 2.5 mg po daily    05/27/2015 - 05/30/2015 Hospital Admission    Acute systolic CHF, dilated cardiomyopathy, EF 35-40%    08/09/2015 Surgery    Right lumpectomy: Fibrocystic changes with calcifications, right nipple lesion: Seborrheic keratosis no evidence of malignancy    10/20/2017 Imaging    Elevated LFTs letter ultrasound of the liver which showed liver metastases    11/04/2017 PET scan    Heavy burden of metastatic disease to the liver involving all lobes, index 7.7 centimeters right hepatic lobe mass SUV 16.6, small but hypermetabolic left supraclavicular, left subpectoral, portacaval, aortocaval adenopathy    11/12/2017 Pathology Results    Liver biopsy: Metastatic breast cancer, ER 100%, PR 0%, Ki-67 10%, HER-2 3+ positive     CHIEF COMPLIANT: Follow-up to discuss the results of liver biopsy and the PET CT scan  INTERVAL  HISTORY: [name] is a 77 year old with above-mentioned history of metastatic breast cancer with liver metastases who underwent liver biopsy and a PET CT scan and is here today to discuss results accompanied by her family.  She has had poor appetite and has not been eating too well.  Patient had a lot of liver pain after the biopsy but it has subsided.  REVIEW OF SYSTEMS:   Constitutional: Denies fevers, chills or abnormal weight loss Eyes: Denies blurriness of vision Ears, nose, mouth, throat, and face: Denies mucositis or sore throat Respiratory: Denies cough, dyspnea or wheezes Cardiovascular: Denies palpitation, chest discomfort Gastrointestinal:  Denies nausea, heartburn or change in bowel habits Skin: Denies abnormal skin rashes Lymphatics: Denies new lymphadenopathy or easy bruising Neurological:Denies numbness, tingling or new weaknesses Behavioral/Psych: Mood is stable, no new changes  Extremities: No lower extremity edema   All other systems were reviewed with the patient and are negative.  I have reviewed the past medical history, past surgical history, social history and family history with the patient and they are unchanged from previous note.  ALLERGIES:  is allergic to oxycodone-acetaminophen; cephalexin; clindamycin; dilaudid [hydromorphone hcl]; oxycodone-acetaminophen; rocephin [ceftriaxone sodium in dextrose]; and sulfonamide derivatives.  MEDICATIONS:  Current Outpatient Medications  Medication Sig Dispense Refill  . acyclovir (ZOVIRAX) 200 MG capsule TAKE 2 CAPSULES BY MOUTH 3 TIMES DAILY (Patient taking differently: Take 400 mg by mouth 3 (three) times daily. ) 540 capsule 3  . ALPRAZolam (XANAX) 0.5 MG  tablet TAKE ONE-HALF TO ONE TABLET BY MOUTH THREE TIMES DAILY AS NEEDED FOR  NERVES 270 tablet 1  . atorvastatin (LIPITOR) 40 MG tablet Take 40 mg by mouth daily.    . carvedilol (COREG) 6.25 MG tablet TAKE 1 TABLET TWICE DAILY WITH A MEAL (Patient taking  differently: Take 6.25 mg by mouth 2 (two) times daily with a meal. ) 180 tablet 1  . celecoxib (CELEBREX) 200 MG capsule Take 200 mg by mouth daily as needed for pain. And inflammation    . cholecalciferol (VITAMIN D) 1000 UNITS tablet Take 1 tablet (1,000 Units total) by mouth daily. 90 tablet 3  . cyanocobalamin 500 MCG tablet Take 1 tablet (500 mcg total) by mouth daily. 90 tablet 3  . fluconazole (DIFLUCAN) 100 MG tablet Take 1 tablet (100 mg total) by mouth daily. (Patient not taking: Reported on 11/15/2017) 7 tablet 0  . HYDROcodone-acetaminophen (NORCO) 5-325 MG tablet Take 1 tablet by mouth every 4 (four) hours as needed. 20 tablet 0  . Insulin Pen Needle (RELION PEN NEEDLE 31G/8MM) 31G X 8 MM MISC 1 Device by Does not apply route 4 (four) times daily. 120 each 11  . letrozole (FEMARA) 2.5 MG tablet TAKE ONE TABLET BY MOUTH DAILY. 90 tablet 3  . Liraglutide (VICTOZA) 18 MG/3ML SOPN Inject 3 mLs (18 mg total) into the skin every morning. (Patient taking differently: Inject 1.8 mg into the skin every morning. ) 6 mL   . losartan (COZAAR) 100 MG tablet Take 1 tablet (100 mg total) by mouth daily. 90 tablet 3  . magnesium oxide (MAG-OX) 400 MG tablet Take 1 tablet (400 mg total) by mouth daily. 30 tablet 9  . Multiple Vitamins-Minerals (ONE-A-DAY WOMENS 50+ ADVANTAGE) TABS Take 1 tablet by mouth daily. 90 tablet 3  . omeprazole (PRILOSEC) 40 MG capsule Take 40 mg by mouth daily.    . sertraline (ZOLOFT) 50 MG tablet Take 1 tablet (50 mg total) by mouth daily. 90 tablet 3  . spironolactone (ALDACTONE) 25 MG tablet TAKE 1 TABLET BY MOUTH EVERY DAY 30 tablet 6  . tolterodine (DETROL LA) 4 MG 24 hr capsule Take 1 capsule by mouth daily.     No current facility-administered medications for this visit.     PHYSICAL EXAMINATION: ECOG PERFORMANCE STATUS: 1 - Symptomatic but completely ambulatory  Vitals:   11/18/17 0916  BP: 134/87  Pulse: 91  Resp: 16  Temp: (!) 97.5 F (36.4 C)  SpO2:  97%   Filed Weights   11/18/17 0916  Weight: 167 lb 3.2 oz (75.8 kg)    GENERAL:alert, no distress and comfortable SKIN: skin color, texture, turgor are normal, no rashes or significant lesions EYES: normal, Conjunctiva are pink and non-injected, sclera clear OROPHARYNX:no exudate, no erythema and lips, buccal mucosa, and tongue normal  NECK: supple, thyroid normal size, non-tender, without nodularity LYMPH:  no palpable lymphadenopathy in the cervical, axillary or inguinal LUNGS: clear to auscultation and percussion with normal breathing effort HEART: regular rate & rhythm and no murmurs and no lower extremity edema ABDOMEN:abdomen soft, non-tender and normal bowel sounds MUSCULOSKELETAL:no cyanosis of digits and no clubbing  NEURO: alert & oriented x 3 with fluent speech, no focal motor/sensory deficits EXTREMITIES: No lower extremity edema   LABORATORY DATA:  I have reviewed the data as listed CMP Latest Ref Rng & Units 11/15/2017 04/29/2016 11/04/2015  Glucose 70 - 99 mg/dL 133(H) 93 153(H)  BUN 8 - 23 mg/dL 20 14.4 17  Creatinine  0.44 - 1.00 mg/dL 0.75 0.8 0.73  Sodium 135 - 145 mmol/L 141 142 142  Potassium 3.5 - 5.1 mmol/L 4.1 4.1 4.1  Chloride 98 - 111 mmol/L 106 - 104  CO2 22 - 32 mmol/L 24 31(H) 29  Calcium 8.9 - 10.3 mg/dL 9.9 10.3 10.0  Total Protein 6.4 - 8.3 g/dL - 7.0 -  Total Bilirubin 0.20 - 1.20 mg/dL - 0.53 -  Alkaline Phos 40 - 150 U/L - 127 -  AST 5 - 34 U/L - 15 -  ALT 0 - 55 U/L - 15 -    Lab Results  Component Value Date   WBC 7.2 11/15/2017   HGB 13.8 11/15/2017   HCT 43.6 11/15/2017   MCV 93.8 11/15/2017   PLT 224 11/15/2017   NEUTROABS 4.8 11/15/2017    ASSESSMENT & PLAN:  Primary cancer of lower-inner quadrant of left female breast ([place]) Left breast invasive lobular cancer diagnosed 02/20/2011 status post left mastectomy with axillary lymph node dissection, 6 cm ILC with LVI, PNI, 9/16 lymph nodes positive with extracapsular extension, ER  100%, PR 100%, HER-2 -1.31, Ki-67 18%, T3a N2 a M0 stage IIIa status post F AC chemotherapy 4 followed by radiation and currently on letrozole 2.5 mg daily since 10/16/2011  Relapse/recurrence: Elevated LFTs led to ultrasound of the liver which revealed liver metastases September 2019  PET CT scan 11/04/2017: Heavy burden of metastatic disease to the liver involving all lobes, largest 7.7 x 5.4 cm with SUV of 16.6, additional hypermetabolic left supraclavicular, left subpectoral, portacaval and aortocaval lymphadenopathy  Liver biopsy: 11/17/2017: Metastatic breast cancer ER 100%, PR 0%, HER-2 3+ positive  Recommendation: 1.  Systemic chemotherapy on NRG BR 004 clinical trial patients are randomized between Taxol Herceptin Perjeta +/- Atezolizumab Patient was provided with literature on the clinical trial.  She will inform us of her decision. I counseled the patient extensively regarding the adverse effects of Taxol Herceptin and Perjeta as well as immunotherapy.  I requested Dr. Donne Hazel to implant a port. Plan to start chemotherapy depending on her decision to participate in the clinical trial.   No orders of the defined types were placed in this encounter.  The patient has a good understanding of the overall plan. she agrees with it. she will call with any problems that may develop before the next visit here.   Harriette Ohara, MD 11/18/17

## 2017-11-18 NOTE — Assessment & Plan Note (Signed)
Left breast invasive lobular cancer diagnosed 02/20/2011 status post left mastectomy with axillary lymph node dissection, 6 cm ILC with LVI, PNI, 9/16 lymph nodes positive with extracapsular extension, ER 100%, PR 100%, HER-2 -1.31, Ki-67 18%, T3a N2 a M0 stage IIIa status post F AC chemotherapy 4 followed by radiation and currently on letrozole 2.5 mg daily since 10/16/2011  Relapse/recurrence: Elevated LFTs led to ultrasound of the liver which revealed liver metastases September 2019  PET CT scan 11/04/2017: Heavy burden of metastatic disease to the liver involving all lobes, largest 7.7 x 5.4 cm with SUV of 16.6, additional hypermetabolic left supraclavicular, left subpectoral, portacaval and aortocaval lymphadenopathy  Liver biopsy: 11/17/2017: Metastatic breast cancer ER 100%, PR 0%, HER-2 3+ positive  Recommendation: 1.  Systemic chemotherapy on NRG BR 004 clinical trial patients are randomized between Taxol Herceptin Perjeta +/- Atezolizumab  I counseled the patient extensively regarding the adverse effects of Taxol Herceptin and Perjeta as well as immunotherapy.

## 2017-11-19 ENCOUNTER — Telehealth: Payer: Self-pay | Admitting: Hematology and Oncology

## 2017-11-19 ENCOUNTER — Other Ambulatory Visit: Payer: Self-pay | Admitting: Hematology and Oncology

## 2017-11-19 ENCOUNTER — Telehealth: Payer: Self-pay | Admitting: *Deleted

## 2017-11-19 ENCOUNTER — Other Ambulatory Visit: Payer: Self-pay | Admitting: *Deleted

## 2017-11-19 DIAGNOSIS — C50312 Malignant neoplasm of lower-inner quadrant of left female breast: Secondary | ICD-10-CM

## 2017-11-19 MED ORDER — LORAZEPAM 0.5 MG PO TABS: 0.5000 mg | ORAL_TABLET | Freq: Every evening | ORAL | 0 refills | Status: DC | PRN

## 2017-11-19 MED ORDER — LIDOCAINE-PRILOCAINE 2.5-2.5 % EX CREA: TOPICAL_CREAM | CUTANEOUS | 3 refills | Status: AC

## 2017-11-19 MED ORDER — DEXAMETHASONE 4 MG PO TABS: 4.0000 mg | ORAL_TABLET | Freq: Every day | ORAL | 0 refills | Status: DC

## 2017-11-19 MED ORDER — ONDANSETRON HCL 8 MG PO TABS: 8.0000 mg | ORAL_TABLET | Freq: Two times a day (BID) | ORAL | 1 refills | Status: DC | PRN

## 2017-11-19 MED ORDER — PROCHLORPERAZINE MALEATE 10 MG PO TABS: 10.0000 mg | ORAL_TABLET | Freq: Four times a day (QID) | ORAL | 1 refills | Status: DC | PRN

## 2017-11-19 NOTE — Telephone Encounter (Signed)
Medical records faxed to [facility]; release [phone]

## 2017-11-19 NOTE — Telephone Encounter (Signed)
Appts scheduled lm with patient and calendar/letter mailed per 10/18 sch msg

## 2017-11-19 NOTE — Progress Notes (Signed)
START ON PATHWAY REGIMEN - Breast     A cycle is every 21 days:     Pertuzumab      Pertuzumab      Trastuzumab-xxxx      Trastuzumab-xxxx      Docetaxel   **Always confirm dose/schedule in your pharmacy ordering system**  Patient Characteristics: Distant Metastases or Locoregional Recurrent Disease - Unresected or Locally Advanced Unresectable Disease Progressing after Neoadjuvant and Local Therapies, HER2 Positive, ER Negative/Unknown, Chemotherapy, First Line Therapeutic Status: Distant Metastases BRCA Mutation Status: Absent ER Status: Negative (-) HER2 Status: Positive (+) PR Status: Negative (-) Line of Therapy: First Line Intent of Therapy: Non-Curative / Palliative Intent, Discussed with Patient 

## 2017-11-21 ENCOUNTER — Other Ambulatory Visit: Payer: Self-pay | Admitting: General Surgery

## 2017-11-22 NOTE — Pre-Procedure Instructions (Signed)
[name]  11/22/2017      [place], [place] [address] [place] [place] [address] Phone: [phone] Fax: [facility] [place], [place] [address] Phone: [phone] Fax: [phone]  [id number] [place], [place] - [address] [place] [address] Phone: [phone] Fax: [phone]    Your procedure is scheduled on Thursday, Oct. 24th   Report to [facility] Admitting at 5:30AM             (posted surgery time 7:30a - 8:15a)   Call this number if you have problems the morning of surgery:  [phone]   Remember:   Do not eat any foods after midnight Wednesday.   You may drink clear liquids until 4:30 AM  Clear liquids allowed are:  Water, Juice (non-citric and without pulp), Clear Tea, Black Coffee only and Gatorade. Please drink the PreSurgery Ensure Drink 3 hrs (0430) prior to surgery.                 4-5 days prior to surgery, STOP TAKING any Vitamins, Herbal Supplements, Anti-inflammatories, Blood Thinners.    Take these medicines the morning of surgery with A SIP OF WATER : Xanax, Carvedilol, Hydrocodone, Prilosec, Sertraline.     Do not wear jewelry, make-up or nail polish.  Do not wear lotions, powders, perfumes, or deodorant.  Do not shave 48 hours prior to surgery.     Do not bring valuables to the hospital.  [facility] is not responsible for any belongings or valuables.  Contacts, dentures or bridgework may not be worn into surgery.  Leave your suitcase in the car.  After surgery it may be brought to your room.  For patients admitted to the hospital, discharge time will be determined by your treatment team.  Patients discharged the day of surgery will not be allowed to drive home, and will need someone to stay with you for  the first 24 hrs.  Please read over the following fact sheets that you were given. Surgical Site Infection Prevention       How to Manage Your Diabetes Before and After Surgery  Why is it important to control my blood sugar before and after surgery? . Improving blood sugar levels before and after surgery helps healing and can limit problems. . A way of improving blood sugar control is eating a healthy diet by: o  Eating less sugar and carbohydrates o  Increasing activity/exercise o  Talking with your doctor about reaching your blood sugar goals . High blood sugars (greater than 180 mg/dL) can raise your risk of infections and slow your recovery, so you will need to focus on controlling your diabetes during the weeks before surgery. . Make sure that the doctor who takes care of your diabetes knows about your planned surgery including the date and location.  How do I manage my blood sugar before surgery? . Check your blood sugar at least 4 times a day, starting 2 days before surgery, to make sure that the level is not too high or low. Marland Kitchen  o Check your blood sugar the morning of your surgery when you wake up and every 2 hours until you get to the Short Stay unit. . If your blood sugar is less than 70 mg/dL, you will need to treat  for low blood sugar: o Do not take insulin. o Treat a low blood sugar (less than 70 mg/dL) with  cup of clear juice (cranberry or apple), 4 glucose tablets, OR glucose gel. o  Recheck blood sugar in 15 minutes after treatment (to make sure it is greater than 70 mg/dL). If your blood sugar is not greater than 70 mg/dL on recheck, call [phone] o  for further instructions. . Report your blood sugar to the short stay nurse when you get to Short Stay.  . If you are admitted to the hospital after surgery: o Your blood sugar will be checked by the staff and you will probably be given insulin after surgery (instead of oral diabetes medicines) to make sure you  have good blood sugar levels. o The goal for blood sugar control after surgery is 80-180 mg/dL.  WHAT DO I DO ABOUT MY DIABETES MEDICATION?   Marland Kitchen Do not take oral diabetes medicines (pills) the morning of surgery.  . The day of surgery, do not take other diabetes injectables, including Byetta (exenatide), Bydureon (exenatide ER), Victoza (liraglutide), or Trulicity (dulaglutide).  . If your CBG is greater than 220 mg/dL, you may take  of your sliding scale (correction) dose of insulin.  Other Instructions:          Patient Signature:  Date:   Nurse Signature:  Date:        [place]- Preparing For Surgery  Before surgery, you can play an important role. Because skin is not sterile, your skin needs to be as free of germs as possible. You can reduce the number of germs on your skin by washing with CHG (chlorahexidine gluconate) Soap before surgery.  CHG is an antiseptic cleaner which kills germs and bonds with the skin to continue killing germs even after washing.    Oral Hygiene is also important to reduce your risk of infection.    Remember - BRUSH YOUR TEETH THE MORNING OF SURGERY WITH YOUR REGULAR TOOTHPASTE  Please do not use if you have an allergy to CHG or antibacterial soaps. If your skin becomes reddened/irritated stop using the CHG.  Do not shave (including legs and underarms) for at least 48 hours prior to first CHG shower. It is OK to shave your face.  Please follow these instructions carefully.   1. Shower the NIGHT BEFORE SURGERY and the MORNING OF SURGERY with CHG.   2. If you chose to wash your hair, wash your hair first as usual with your normal shampoo.  3. After you shampoo, rinse your hair and body thoroughly to remove the shampoo.  4. Use CHG as you would any other liquid soap. You can apply CHG directly to the skin and wash gently with a scrungie or a clean washcloth.   5. Apply the CHG Soap to your body ONLY FROM THE NECK DOWN.  Do not use on open  wounds or open sores. Avoid contact with your eyes, ears, mouth and genitals (private parts). Wash Face and genitals (private parts)  with your normal soap.  6. Wash thoroughly, paying special attention to the area where your surgery will be performed.  7. Thoroughly rinse your body with warm water from the neck down.  8. DO NOT shower/wash with your normal soap after using and rinsing off the CHG Soap.  9. Pat yourself dry with a CLEAN TOWEL.  10. Wear CLEAN PAJAMAS to bed the night before surgery, wear comfortable clothes the morning of surgery  11. Place  CLEAN SHEETS on your bed the night of your first shower and DO NOT SLEEP WITH PETS.  Day of Surgery:  Do not apply any deodorants/lotions.  Please wear clean clothes to the hospital/surgery center.    Remember to brush your teeth WITH YOUR REGULAR TOOTHPASTE.

## 2017-11-23 ENCOUNTER — Encounter (HOSPITAL_COMMUNITY)
Admission: RE | Admit: 2017-11-23 | Discharge: 2017-11-23 | Disposition: A | Discharge status: Home / Self Care | Payer: Medicare HMO | Source: Ambulatory Visit | Attending: General Surgery | Admitting: General Surgery

## 2017-11-23 ENCOUNTER — Encounter (HOSPITAL_COMMUNITY): Payer: Self-pay

## 2017-11-23 ENCOUNTER — Other Ambulatory Visit: Payer: Self-pay

## 2017-11-23 DIAGNOSIS — F419 Anxiety disorder, unspecified: Secondary | ICD-10-CM | POA: Insufficient documentation

## 2017-11-23 DIAGNOSIS — E119 Type 2 diabetes mellitus without complications: Secondary | ICD-10-CM

## 2017-11-23 DIAGNOSIS — Z923 Personal history of irradiation: Secondary | ICD-10-CM

## 2017-11-23 DIAGNOSIS — E785 Hyperlipidemia, unspecified: Secondary | ICD-10-CM

## 2017-11-23 DIAGNOSIS — M8580 Other specified disorders of bone density and structure, unspecified site: Secondary | ICD-10-CM | POA: Diagnosis not present

## 2017-11-23 DIAGNOSIS — I5042 Chronic combined systolic (congestive) and diastolic (congestive) heart failure: Secondary | ICD-10-CM | POA: Insufficient documentation

## 2017-11-23 DIAGNOSIS — K219 Gastro-esophageal reflux disease without esophagitis: Secondary | ICD-10-CM

## 2017-11-23 DIAGNOSIS — Z79811 Long term (current) use of aromatase inhibitors: Secondary | ICD-10-CM | POA: Diagnosis not present

## 2017-11-23 DIAGNOSIS — Z9221 Personal history of antineoplastic chemotherapy: Secondary | ICD-10-CM | POA: Insufficient documentation

## 2017-11-23 DIAGNOSIS — Z853 Personal history of malignant neoplasm of breast: Secondary | ICD-10-CM | POA: Diagnosis not present

## 2017-11-23 DIAGNOSIS — Z9012 Acquired absence of left breast and nipple: Secondary | ICD-10-CM

## 2017-11-23 DIAGNOSIS — C7951 Secondary malignant neoplasm of bone: Secondary | ICD-10-CM | POA: Diagnosis not present

## 2017-11-23 DIAGNOSIS — Z885 Allergy status to narcotic agent status: Secondary | ICD-10-CM | POA: Diagnosis not present

## 2017-11-23 DIAGNOSIS — Z882 Allergy status to sulfonamides status: Secondary | ICD-10-CM | POA: Diagnosis not present

## 2017-11-23 DIAGNOSIS — Z01818 Encounter for other preprocedural examination: Secondary | ICD-10-CM

## 2017-11-23 DIAGNOSIS — Z79899 Other long term (current) drug therapy: Secondary | ICD-10-CM

## 2017-11-23 DIAGNOSIS — Z881 Allergy status to other antibiotic agents status: Secondary | ICD-10-CM | POA: Diagnosis not present

## 2017-11-23 DIAGNOSIS — I420 Dilated cardiomyopathy: Secondary | ICD-10-CM

## 2017-11-23 DIAGNOSIS — Z794 Long term (current) use of insulin: Secondary | ICD-10-CM | POA: Diagnosis not present

## 2017-11-23 DIAGNOSIS — C50919 Malignant neoplasm of unspecified site of unspecified female breast: Secondary | ICD-10-CM | POA: Diagnosis present

## 2017-11-23 DIAGNOSIS — G620 Drug-induced polyneuropathy: Secondary | ICD-10-CM | POA: Diagnosis not present

## 2017-11-23 DIAGNOSIS — F329 Major depressive disorder, single episode, unspecified: Secondary | ICD-10-CM | POA: Insufficient documentation

## 2017-11-23 DIAGNOSIS — C787 Secondary malignant neoplasm of liver and intrahepatic bile duct: Secondary | ICD-10-CM | POA: Diagnosis not present

## 2017-11-23 DIAGNOSIS — Z90710 Acquired absence of both cervix and uterus: Secondary | ICD-10-CM | POA: Insufficient documentation

## 2017-11-23 DIAGNOSIS — I491 Atrial premature depolarization: Secondary | ICD-10-CM | POA: Diagnosis not present

## 2017-11-23 DIAGNOSIS — I110 Hypertensive heart disease with heart failure: Secondary | ICD-10-CM | POA: Insufficient documentation

## 2017-11-23 DIAGNOSIS — Z1501 Genetic susceptibility to malignant neoplasm of breast: Secondary | ICD-10-CM | POA: Diagnosis not present

## 2017-11-23 DIAGNOSIS — K449 Diaphragmatic hernia without obstruction or gangrene: Secondary | ICD-10-CM | POA: Diagnosis not present

## 2017-11-23 HISTORY — DX: Dyspnea, unspecified: R06.00

## 2017-11-23 HISTORY — DX: Personal history of other medical treatment: Z92.89

## 2017-11-23 HISTORY — DX: Polyneuropathy, unspecified: G62.9

## 2017-11-23 HISTORY — DX: Pneumonia, unspecified organism: J18.9

## 2017-11-23 HISTORY — DX: Personal history of other diseases of the digestive system: Z87.19

## 2017-11-23 LAB — GLUCOSE, CAPILLARY: GLUCOSE-CAPILLARY: 128 mg/dL — AB (ref 70–99)

## 2017-11-23 NOTE — Pre-Procedure Instructions (Signed)
[name]  11/23/2017      Your procedure is scheduled on Thursday, Oct. 24th   Report to [facility] Admitting at 5:30AM             (posted surgery time 7:30a - 8:15a)   Call this number if you have problems the morning of surgery:  [phone]   Remember:   Do not eat any foods after midnight Wednesday.   You may drink clear liquids until 4:30 AM  Clear liquids allowed are:  Water, Juice (non-citric and without pulp), Clear Tea, Black Coffee only and Gatorade.  Please drink the PreSurgery Ensure Drink 3 hrs (0430) prior to surgery.    STOP TAKING any Vitamins, Herbal Supplements, Anti-inflammatories, Blood Thinners.    Take these medicines the morning of surgery with A SIP OF WATER :  Carvedilol (Coreg) Letrozole (Femara) Omeprazole (Prilosec) Sertraline (Zoloft) Acyclovir  If needed may take:  Hydrocodone- Acetaminophen (Norco)  Alprazolam (Xanax)    How to Manage Your Diabetes Before and After Surgery  Why is it important to control my blood sugar before and after surgery? . Improving blood sugar levels before and after surgery helps healing and can limit problems. . A way of improving blood sugar control is eating a healthy diet by: o  Eating less sugar and carbohydrates o  Increasing activity/exercise o  Talking with your doctor about reaching your blood sugar goals . High blood sugars (greater than 180 mg/dL) can raise your risk of infections and slow your recovery, so you will need to focus on controlling your diabetes during the weeks before surgery. . Make sure that the doctor who takes care of your diabetes knows about your planned surgery including the date and location.  How do I manage my blood sugar before surgery? . Check your blood sugar at least 4 times a day, starting 2 days before surgery, to make sure that the level is not too high or low. Marland Kitchen  o Check your blood sugar the morning of your surgery when you wake up and  every 2 hours until you get to the Short Stay unit. . If your blood sugar is less than 70 mg/dL, you will need to treat for low blood sugar: o Do not take insulin. o Treat a low blood sugar (less than 70 mg/dL) with  cup of clear juice (cranberry or apple), 4 glucose tablets, OR glucose gel. o  Recheck blood sugar in 15 minutes after treatment (to make sure it is greater than 70 mg/dL). If your blood sugar is not greater than 70 mg/dL on recheck, call [phone] o  for further instructions. . Report your blood sugar to the short stay nurse when you get to Short Stay.  . If you are admitted to the hospital after surgery: o Your blood sugar will be checked by the staff and you will probably be given insulin after surgery (instead of oral diabetes medicines) to make sure you have good blood sugar levels. o The goal for blood sugar control after surgery is 80-180 mg/dL.  WHAT DO I DO ABOUT MY DIABETES MEDICATION?   Marland Kitchen The day of surgery, do not take other diabetes injectables, including Byetta (exenatide), Bydureon (exenatide ER), Victoza (liraglutide), or Trulicity (dulaglutide).    [place]- Preparing For Surgery  Before surgery, you can play an important role. Because skin is not sterile, your skin needs to be as free of germs as possible. You can reduce the number of  germs on your skin by washing with CHG (chlorahexidine gluconate) Soap before surgery.  CHG is an antiseptic cleaner which kills germs and bonds with the skin to continue killing germs even after washing.    Oral Hygiene is also important to reduce your risk of infection.    Remember - BRUSH YOUR TEETH THE MORNING OF SURGERY WITH YOUR REGULAR TOOTHPASTE  Please do not use if you have an allergy to CHG or antibacterial soaps. If your skin becomes reddened/irritated stop using the CHG.  Do not shave (including legs and underarms) for at least 48 hours prior to first CHG shower. It is OK to shave your face.  Please follow  these instructions carefully.   1. Shower the NIGHT BEFORE SURGERY and the MORNING OF SURGERY with CHG.   2. If you chose to wash your hair, wash your hair first as usual with your normal shampoo.  3. After you shampoo, wash your face and private area with the soap you use at home, then rinse your hair and body thoroughly to remove the shampoo and soap  4. Use CHG as you would any other liquid soap. You can apply CHG directly to the skin and wash gently with a scrungie or a clean washcloth.   5. Apply the CHG Soap to your body ONLY FROM THE NECK DOWN.  Do not use on open wounds or open sores. Avoid contact with your eyes, ears, mouth and genitals (private parts). (private parts)  with your   6. Wash thoroughly, paying special attention to the area where your surgery will be performed.  7. Thoroughly rinse your body with warm water from the neck down.  8. DO NOT shower/wash with your normal soap after using and rinsing off the CHG Soap.  9. Pat yourself dry with a CLEAN TOWEL.  10. Wear CLEAN PAJAMAS to bed the night before surgery, wear comfortable clothes the morning of surgery  11. Place CLEAN SHEETS on your bed the night of your first shower and DO NOT SLEEP WITH PETS.  Day of Surgery:  Shower as above Do not apply any deodorants/lotions, powders or colognes  Please wear clean clothes to the hospital/surgery center.   Remember to brush your teeth WITH YOUR REGULAR TOOTHPASTE     Do not wear jewelry, make-up or nail polish.  Do not shave 48 hours prior to surgery.    Do not bring valuables to the hospital.  [facility] is not responsible for any belongings or valuables.  Contacts, dentures or bridgework may not be worn into surgery.  Leave your suitcase in the car.  After surgery it may be brought to your room.  For patients admitted to the hospital, discharge time will be determined by your treatment team.  Patients discharged the day of surgery will not be allowed to drive  home, and will need someone to stay with you for the first 24 hrs.  Please read over the following fact sheets that you were given. Surgical Site Infection Prevention

## 2017-11-23 NOTE — Progress Notes (Signed)
Mrs Windt reports that she does become short of breath at rest at times, " I think it is from stress."  Patient denies chest pain , does experience  Palpations at times, denies shortness of breath or lightheadedness when this happens.  Patient's cardiologist is Dr Fransico Him.  Patient saw Dr Radford Pax last in 2018- "I haven't had time to make an appointment with all of this.  Patient is scheduled for an ECHO tomorrow prior to starting chemo on Friday, 11/26/17.

## 2017-11-24 ENCOUNTER — Ambulatory Visit (HOSPITAL_COMMUNITY)
Admission: RE | Admit: 2017-11-24 | Discharge: 2017-11-24 | Disposition: A | Discharge status: Home / Self Care | Payer: Medicare HMO | Source: Ambulatory Visit | Attending: Hematology and Oncology | Admitting: Hematology and Oncology

## 2017-11-24 DIAGNOSIS — E119 Type 2 diabetes mellitus without complications: Secondary | ICD-10-CM | POA: Diagnosis not present

## 2017-11-24 DIAGNOSIS — Z01810 Encounter for preprocedural cardiovascular examination: Secondary | ICD-10-CM | POA: Diagnosis not present

## 2017-11-24 DIAGNOSIS — I420 Dilated cardiomyopathy: Secondary | ICD-10-CM | POA: Diagnosis not present

## 2017-11-24 DIAGNOSIS — C50312 Malignant neoplasm of lower-inner quadrant of left female breast: Secondary | ICD-10-CM | POA: Diagnosis present

## 2017-11-24 DIAGNOSIS — E785 Hyperlipidemia, unspecified: Secondary | ICD-10-CM | POA: Diagnosis not present

## 2017-11-24 DIAGNOSIS — I493 Ventricular premature depolarization: Secondary | ICD-10-CM | POA: Insufficient documentation

## 2017-11-24 NOTE — Anesthesia Preprocedure Evaluation (Addendum)
Anesthesia Evaluation  Patient identified by MRN, date of birth, ID band Patient awake    Reviewed: Allergy & Precautions, NPO status , Patient's Chart, lab work & pertinent test results, reviewed documented beta blocker date and time   Airway Mallampati: II  TM Distance: >3 FB Neck ROM: Full    Dental no notable dental hx.    Pulmonary neg pulmonary ROS,    Pulmonary exam normal breath sounds clear to auscultation       Cardiovascular hypertension, Pt. on medications and Pt. on home beta blockers +CHF  negative cardio ROS Normal cardiovascular exam Rhythm:Regular Rate:Normal  Non-ischemic cardiomyopathy (normal coronaries, EF 30-30% 05/2015)  Echo 11/24/17 (pre-chemo): Study Conclusions - Left ventricle: Average longitudinal LV strain is abnormal at -11% The cavity size was normal. There was mild concentric hypertrophy. Systolic function was moderately to severely reduced. The estimated ejection fraction was in the range of 30% to 35%. Moderate diffuse hypokinesis. There is akinesis of the inferolateral and inferior myocardium. There was an increased relative contribution of atrial contraction to ventricular filling. Doppler parameters are consistent with abnormal left ventricular relaxation (grade 1 diastolic dysfunction). - Aortic valve: Trileaflet; mildly thickened, mildly calcified leaflets. - Mitral valve: Valve area by pressure half-time: 1.98 cm^2. - Pulmonic valve: There was mild regurgitation. - Impressions: The findings indicate significant septal-lateral left ventricular wall dyssynchrony due to conduction delay Impressions: - The findings indicate significant septal-lateral left ventricular wall dyssynchrony due to conduction delay     Neuro/Psych Anxiety Depression negative neurological ROS  negative psych ROS   GI/Hepatic Neg liver ROS, GERD  ,  Endo/Other  negative endocrine ROSdiabetes   Renal/GU negative Renal ROS  negative genitourinary   Musculoskeletal  (+) Arthritis , Osteoarthritis,    Abdominal   Peds negative pediatric ROS (+)  Hematology negative hematology ROS (+) Metastatic breast cancer   Anesthesia Other Findings Breast Cancer  Reproductive/Obstetrics negative OB ROS                                                            Anesthesia Evaluation  Patient identified by MRN, date of birth, ID band Patient awake    Reviewed: Allergy & Precautions, NPO status , Patient's Chart, lab work & pertinent test results  Airway Mallampati: II       Dental   Pulmonary asthma ,    breath sounds clear to auscultation       Cardiovascular hypertension, Pt. on medications +CHF   Rhythm:Regular Rate:Normal  Echo 4/17 EF- 35-40percent   Neuro/Psych PSYCHIATRIC DISORDERS Anxiety Depression  Neuromuscular disease    GI/Hepatic GERD  Medicated,  Endo/Other  diabetes, Insulin Dependent  Renal/GU      Musculoskeletal  (+) Arthritis ,   Abdominal   Peds  Hematology   Anesthesia Other Findings   Reproductive/Obstetrics                           Anesthesia Physical Anesthesia Plan  ASA: III  Anesthesia Plan: General   Post-op Pain Management:    Induction: Intravenous  Airway Management Planned: LMA  Additional Equipment:   Intra-op Plan:   Post-operative Plan:   Informed Consent: I have reviewed the patients History and Physical, chart, labs and discussed the procedure including the  risks, benefits and alternatives for the proposed anesthesia with the patient or authorized representative who has indicated his/her understanding and acceptance.     Plan Discussed with: CRNA and Anesthesiologist  Anesthesia Plan Comments:         Anesthesia Quick Evaluation  Anesthesia Physical Anesthesia Plan  ASA: III  Anesthesia Plan: General   Post-op Pain Management:     Induction: Intravenous  PONV Risk Score and Plan: 3  Airway Management Planned: LMA  Additional Equipment:   Intra-op Plan:   Post-operative Plan: Extubation in OR  Informed Consent: I have reviewed the patients History and Physical, chart, labs and discussed the procedure including the risks, benefits and alternatives for the proposed anesthesia with the patient or authorized representative who has indicated his/her understanding and acceptance.   Dental advisory given  Plan Discussed with: CRNA  Anesthesia Plan Comments:         Anesthesia Quick Evaluation

## 2017-11-24 NOTE — Progress Notes (Signed)
Anesthesia Chart Review:  Case:  [id number] Date/Time:  11/25/17 0700   Procedure:  INSERTION PORT-A-CATH WITH [place] (N/A )   Anesthesia type:  General   Pre-op diagnosis:  BREAST CANCER   Location:  [facility] ROOM 02 / [facility]   Surgeon:  Rolm Bookbinder, MD      DISCUSSION: Patient is a 77 year old female scheduled for the above procedure. History includes left breast cancer (lobular carcinoma 02/20/11, s/p left modified radical mastectomy with sentinel LN biopsy 03/23/11, s/p chemoradiation '13, liver biopsy 11/12/17 confirmed metastatic breast cancer), dilated non-ischemic cardiomyopathy (normal coronary, EF 35-40% 05/2015; possibly chemo-related), combined chronic diastolic and systolic HF, HTN, HLD, sinus bradycardia, dyspnea, DM2, hiatal hernia.    Pre-chemotherapy echo from today (11/24/17) showed EF 30-35% (down from 35-40% in 2017) with significant septal-lateral left ventricular wall dyssynchrony due to conduction delay. Known non-ischemic CM. Discussed results with anesthesiologist Tamela Gammon, MD. If no acute HF symptoms then it is anticipated that she can proceed with port-a-cath insertion as planned and continue out-patient cardiology follow-up.   VS: BP (!) 142/87   Pulse 99   Temp 36.6 C   Resp 18   Ht 5\' 8"  (1.727 m)   Wt 75.8 kg   SpO2 95%   BMI 25.42 kg/m   PROVIDERS: Vicenta Aly, FNP is PCP  - Fransico Him, MD is cardiologist. Last visit with Melina Copa, PA-C on 09/16/16. B-blocker for palpitations (no sustained arrhythmias on event monitor). Doing well for CHF standpoint, but unable to titrate medications due to dizziness. Six month follow-up recommended. Nicholas Lose, MD is HEM-ONC   LABS: She had labs done on 11/15/17. Results included: Lab Results  Component Value Date   WBC 7.2 11/15/2017   HGB 13.8 11/15/2017   HCT 43.6 11/15/2017   PLT 224 11/15/2017   GLUCOSE 133 (H) 11/15/2017   NA 141 11/15/2017   K 4.1 11/15/2017   CL 106 11/15/2017   CREATININE  0.75 11/15/2017   BUN 20 11/15/2017   CO2 24 11/15/2017   INR 0.92 11/15/2017    IMAGES: PET scan 11/04/17: IMPRESSION: 1. Heavy burden of metastatic disease to the liver involving all lobes. An index 7.7 cm right hepatic lobe mass has a maximum SUV of 16.6. 2. Small but hypermetabolic left supraclavicular, left subpectoral, portacaval, and aortocaval adenopathy indicating malignancy. 3. Other imaging findings of potential clinical significance: Aortic Atherosclerosis (ICD10-I70.0). Left mastectomy. Small type 1 hiatal hernia.  EKG: For unclear reasons, EKG was not done at PAT, so she will need an EKG on the day of surgery.    CV: Echo 11/24/17:  Study Conclusions - Left ventricle: Average longitudinal LV strain is abnormal at   -11% The cavity size was normal. There was mild concentric   hypertrophy. Systolic function was moderately to severely   reduced. The estimated ejection fraction was in the range of 30%   to 35%. Moderate diffuse hypokinesis. There is akinesis of the   inferolateral and inferior myocardium. There was an increased   relative contribution of atrial contraction to ventricular   filling. Doppler parameters are consistent with abnormal left   ventricular relaxation (grade 1 diastolic dysfunction). - Aortic valve: Trileaflet; mildly thickened, mildly calcified   leaflets. - Mitral valve: Valve area by pressure half-time: 1.98 cm^2. - Pulmonic valve: There was mild regurgitation. - Impressions: The findings indicate significant septal-lateral   left ventricular wall dyssynchrony due to conduction delay Impressions: - The findings indicate significant septal-lateral left ventricular  wall dyssynchrony due to conduction delay (Comparison 2/58/52: LV systolic function moderately reduced, EF 77-82%, grade 1 diastolic dysfunction, mild MR)  Cardiac event monitor 03/16/16:  Normal sinus rhythm with average heart rate 71bpm.  Frequent PACs.  Occasional  PVCs  Sinus bradycardia as low as 50bpm.  Cardiac cath 05/29/15:  Global reduction in LV function with estimated EF in the 30-40% range. Normal LV end-diastolic pressure. Etiology uncertain but could be related to late effect of chemotherapeutic agents used to manage breast cancer.  Left dominant coronary system. Normal coronary arteries. RECOMMENDATIONS:  Therapy for heart failure   Past Medical History:  Diagnosis Date  . ALLERGIC RHINITIS 12/13/2007  . ANXIETY 06/04/2008  . Blood transfusion 1964   post childbirth  . 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 Chronic combined systolic (congestive) and diastolic (congestive) heart failure ([place])   . DEGENERATIVE JOINT DISEASE 12/17/2006  . DEPRESSION 08/10/2007  . Dilated cardiomyopathy ([place]) 05/29/2015   a. Dx 05/2015 - EF 35-40% - felt secondary to XRT and chemo for breast CA. Normal coronaries by cath.  . DM type 2 (diabetes mellitus, type 2) ([place])   . Dyspnea    sometimes when walking , sometimes when sitting  . Essential hypertension   . GERD 12/17/2006  . History of blood transfusion    after child birth  . History of chemotherapy   . History of hiatal hernia   . History of radiation therapy 10/07/11-11/24/11   left breast,5040 cGy/28 sessions,l chest wall boost=1000cGy/5 sesions  . 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 Hyperlipidemia   . 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 OBESITY 06/04/2008  . 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 Pneumonia   . Premature atrial contractions   . Sinus bradycardia    a. 2018 event monitor which showed NSR with average HR 71, frequent PACs, occasional PVCs, and sinus bradycardia as low as 50.    Past Surgical History:  Procedure Laterality Date  . bladder tack  1974  . BREAST EXCISIONAL BIOPSY  11/10/1988   left benign Dr Margot Chimes  . BREAST EXCISIONAL BIOPSY  03/04/1983   Right - two areas - Dr Margot Chimes  . BREAST EXCISIONAL BIOPSY  10/09/1980   right - Dr  Margot Chimes  . BREAST EXCISIONAL BIOPSY  10/18/1979   left - Dr Margot Chimes  . BREAST EXCISIONAL BIOPSY  01/02/1994   right - Dr Margot Chimes  . BREAST EXCISIONAL BIOPSY  2017   right  . BREAST SURGERY    . CARDIAC CATHETERIZATION N/A 05/29/2015   Procedure: Left Heart Cath and Coronary Angiography;  Surgeon: Belva Crome, MD;  Location: [facility];  Service: Cardiovascular;  Laterality: N/A;  . CHOLECYSTECTOMY  02/23/1991   LC - Dr Margot Chimes  . COLONOSCOPY    . ENTEROCELE REPAIR  06/08   Dr. Cletis Media  . EVACUATION BREAST HEMATOMA  07/01/2011   Procedure: EVACUATION HEMATOMA BREAST;  Surgeon: Haywood Lasso, MD;  Location: [facility];  Service: General;  Laterality: Left;  Drainage of left mastectomy seroma  . EYE SURGERY Right    catartact  . HIP SURGERY Right 08/27/2010   DR. Maureen Ralphs-  "burcitis removal"  . LAMINECTOMY  1993   s/p-Dr. Rita Ohara  . MASTECTOMY Left 03/23/2011  . MODIFIED RADICAL MASTECTOMY W/ AXILLARY LYMPH NODE DISSECTION Left 03-30-11  . PORT-A-CATH REMOVAL  12/02/2011   Procedure: REMOVAL PORT-A-CATH;  Surgeon: Haywood Lasso,  MD;  Location: [facility];  Service: General;  Laterality: N/A;  . PORTACATH PLACEMENT  04/13/2011   Procedure: INSERTION PORT-A-CATH;  Surgeon: Haywood Lasso, MD;  Location: [facility];  Service: General;  Laterality: N/A;  porta cath placement,removal of J-P drain  . RADIOACTIVE SEED GUIDED EXCISIONAL BREAST BIOPSY Right 08/09/2015   Procedure: RIGHT RADIOACTIVE SEED GUIDED EXCISIONAL BREAST BIOPSY, RIGHT NIPPLE LESION EXCISION;  Surgeon: Rolm Bookbinder, MD;  Location: [facility];  Service: General;  Laterality: Right;  . ROTATOR CUFF REPAIR  8/03   right, Dr. Tonita Cong  . ROTATOR CUFF REPAIR  10/26/2008   left, Dr. Rush Farmer  . TONSILLECTOMY    . VAGINAL HYSTERECTOMY  1974   partial     MEDICATIONS: . acyclovir (ZOVIRAX) 200 MG capsule  . ALPRAZolam (XANAX) 0.5 MG tablet  . atorvastatin (LIPITOR)  40 MG tablet  . carvedilol (COREG) 6.25 MG tablet  . celecoxib (CELEBREX) 200 MG capsule  . cholecalciferol (VITAMIN D) 1000 UNITS tablet  . cyanocobalamin 500 MCG tablet  . dexamethasone (DECADRON) 4 MG tablet  . fluconazole (DIFLUCAN) 100 MG tablet  . HYDROcodone-acetaminophen (NORCO) 5-325 MG tablet  . Insulin Pen Needle (RELION PEN NEEDLE 31G/8MM) 31G X 8 MM MISC  . letrozole (FEMARA) 2.5 MG tablet  . lidocaine-prilocaine (EMLA) cream  . Liraglutide (VICTOZA) 18 MG/3ML SOPN  . LORazepam (ATIVAN) 0.5 MG tablet  . losartan (COZAAR) 100 MG tablet  . magnesium oxide (MAG-OX) 400 MG tablet  . Multiple Vitamins-Minerals (ONE-A-DAY WOMENS 50+ ADVANTAGE) TABS  . omeprazole (PRILOSEC) 40 MG capsule  . ondansetron (ZOFRAN) 8 MG tablet  . prochlorperazine (COMPAZINE) 10 MG tablet  . sertraline (ZOLOFT) 50 MG tablet  . spironolactone (ALDACTONE) 25 MG tablet  . tolterodine (DETROL LA) 4 MG 24 hr capsule   No current facility-administered medications for this encounter.     [facility] Short Stay Center/Anesthesiology Phone [phone] 11/24/2017 1:51 PM

## 2017-11-24 NOTE — Progress Notes (Signed)
  Echocardiogram 2D Echocardiogram has been performed.  Noboru Bidinger L Androw 11/24/2017, 10:50 AM

## 2017-11-25 ENCOUNTER — Ambulatory Visit (HOSPITAL_COMMUNITY): Payer: Medicare HMO | Admitting: Vascular Surgery

## 2017-11-25 ENCOUNTER — Ambulatory Visit (HOSPITAL_COMMUNITY): Payer: Medicare HMO

## 2017-11-25 ENCOUNTER — Encounter (HOSPITAL_COMMUNITY): Payer: Self-pay | Admitting: Urology

## 2017-11-25 ENCOUNTER — Encounter (HOSPITAL_COMMUNITY)
Admission: RE | Disposition: A | Discharge status: Home / Self Care | Payer: Self-pay | Source: Ambulatory Visit | Attending: General Surgery

## 2017-11-25 ENCOUNTER — Ambulatory Visit (HOSPITAL_COMMUNITY)
Admission: RE | Admit: 2017-11-25 | Discharge: 2017-11-25 | Disposition: A | Discharge status: Home / Self Care | Payer: Medicare HMO | Source: Ambulatory Visit | Attending: General Surgery | Admitting: General Surgery

## 2017-11-25 ENCOUNTER — Ambulatory Visit (HOSPITAL_COMMUNITY): Payer: Medicare HMO | Admitting: Certified Registered"

## 2017-11-25 DIAGNOSIS — C787 Secondary malignant neoplasm of liver and intrahepatic bile duct: Secondary | ICD-10-CM | POA: Insufficient documentation

## 2017-11-25 DIAGNOSIS — I5042 Chronic combined systolic (congestive) and diastolic (congestive) heart failure: Secondary | ICD-10-CM | POA: Insufficient documentation

## 2017-11-25 DIAGNOSIS — C50919 Malignant neoplasm of unspecified site of unspecified female breast: Secondary | ICD-10-CM | POA: Insufficient documentation

## 2017-11-25 DIAGNOSIS — Z885 Allergy status to narcotic agent status: Secondary | ICD-10-CM | POA: Insufficient documentation

## 2017-11-25 DIAGNOSIS — E785 Hyperlipidemia, unspecified: Secondary | ICD-10-CM | POA: Insufficient documentation

## 2017-11-25 DIAGNOSIS — G620 Drug-induced polyneuropathy: Secondary | ICD-10-CM | POA: Insufficient documentation

## 2017-11-25 DIAGNOSIS — M8580 Other specified disorders of bone density and structure, unspecified site: Secondary | ICD-10-CM | POA: Insufficient documentation

## 2017-11-25 DIAGNOSIS — Z9012 Acquired absence of left breast and nipple: Secondary | ICD-10-CM | POA: Insufficient documentation

## 2017-11-25 DIAGNOSIS — Z923 Personal history of irradiation: Secondary | ICD-10-CM | POA: Insufficient documentation

## 2017-11-25 DIAGNOSIS — I110 Hypertensive heart disease with heart failure: Secondary | ICD-10-CM | POA: Insufficient documentation

## 2017-11-25 DIAGNOSIS — K449 Diaphragmatic hernia without obstruction or gangrene: Secondary | ICD-10-CM | POA: Insufficient documentation

## 2017-11-25 DIAGNOSIS — I420 Dilated cardiomyopathy: Secondary | ICD-10-CM | POA: Insufficient documentation

## 2017-11-25 DIAGNOSIS — Z419 Encounter for procedure for purposes other than remedying health state, unspecified: Secondary | ICD-10-CM

## 2017-11-25 DIAGNOSIS — C7951 Secondary malignant neoplasm of bone: Secondary | ICD-10-CM | POA: Diagnosis not present

## 2017-11-25 DIAGNOSIS — Z79811 Long term (current) use of aromatase inhibitors: Secondary | ICD-10-CM | POA: Insufficient documentation

## 2017-11-25 DIAGNOSIS — Z882 Allergy status to sulfonamides status: Secondary | ICD-10-CM | POA: Insufficient documentation

## 2017-11-25 DIAGNOSIS — Z794 Long term (current) use of insulin: Secondary | ICD-10-CM | POA: Insufficient documentation

## 2017-11-25 DIAGNOSIS — Z79899 Other long term (current) drug therapy: Secondary | ICD-10-CM | POA: Insufficient documentation

## 2017-11-25 DIAGNOSIS — I491 Atrial premature depolarization: Secondary | ICD-10-CM | POA: Insufficient documentation

## 2017-11-25 DIAGNOSIS — Z881 Allergy status to other antibiotic agents status: Secondary | ICD-10-CM | POA: Insufficient documentation

## 2017-11-25 DIAGNOSIS — Z1501 Genetic susceptibility to malignant neoplasm of breast: Secondary | ICD-10-CM | POA: Insufficient documentation

## 2017-11-25 DIAGNOSIS — Z9221 Personal history of antineoplastic chemotherapy: Secondary | ICD-10-CM | POA: Insufficient documentation

## 2017-11-25 DIAGNOSIS — Z853 Personal history of malignant neoplasm of breast: Secondary | ICD-10-CM | POA: Diagnosis not present

## 2017-11-25 HISTORY — PX: PORTACATH PLACEMENT: SHX2246

## 2017-11-25 LAB — GLUCOSE, CAPILLARY
GLUCOSE-CAPILLARY: 266 mg/dL — AB (ref 70–99)
Glucose-Capillary: 132 mg/dL — ABNORMAL HIGH (ref 70–99)

## 2017-11-25 SURGERY — INSERTION, TUNNELED CENTRAL VENOUS DEVICE, WITH PORT
Anesthesia: General | Site: Chest | Laterality: Right

## 2017-11-25 MED ORDER — DEXAMETHASONE SODIUM PHOSPHATE 10 MG/ML IJ SOLN
INTRAMUSCULAR | Status: DC | PRN
  Administered 2017-11-25: 8 mg via INTRAVENOUS

## 2017-11-25 MED ORDER — PROPOFOL 10 MG/ML IV BOLUS
INTRAVENOUS | Status: AC
  Filled 2017-11-25: qty 40

## 2017-11-25 MED ORDER — ROCURONIUM BROMIDE 50 MG/5ML IV SOSY
PREFILLED_SYRINGE | INTRAVENOUS | Status: AC
  Filled 2017-11-25: qty 5

## 2017-11-25 MED ORDER — HYDROCODONE-ACETAMINOPHEN 5-325 MG PO TABS
1.0000 | ORAL_TABLET | Freq: Once | ORAL | Status: AC
  Administered 2017-11-25: 1 via ORAL

## 2017-11-25 MED ORDER — 0.9 % SODIUM CHLORIDE (POUR BTL) OPTIME
TOPICAL | Status: DC | PRN
  Administered 2017-11-25: 1000 mL

## 2017-11-25 MED ORDER — ENSURE PRE-SURGERY PO LIQD
296.0000 mL | Freq: Once | ORAL | Status: DC
  Filled 2017-11-25: qty 296

## 2017-11-25 MED ORDER — HYDROCODONE-ACETAMINOPHEN 10-325 MG PO TABS: 1.0000 | ORAL_TABLET | Freq: Four times a day (QID) | ORAL | 0 refills | Status: DC | PRN

## 2017-11-25 MED ORDER — PROMETHAZINE HCL 25 MG/ML IJ SOLN: 6.2500 mg | INTRAMUSCULAR | Status: DC | PRN

## 2017-11-25 MED ORDER — ACETAMINOPHEN 650 MG RE SUPP: 650.0000 mg | RECTAL | Status: DC | PRN

## 2017-11-25 MED ORDER — GABAPENTIN 100 MG PO CAPS
100.0000 mg | ORAL_CAPSULE | ORAL | Status: AC
  Administered 2017-11-25: 100 mg via ORAL

## 2017-11-25 MED ORDER — BUPIVACAINE HCL (PF) 0.25 % IJ SOLN
INTRAMUSCULAR | Status: AC
  Filled 2017-11-25: qty 30

## 2017-11-25 MED ORDER — SODIUM CHLORIDE 0.9 % IV SOLN
INTRAVENOUS | Status: AC
  Filled 2017-11-25: qty 1.2

## 2017-11-25 MED ORDER — MIDAZOLAM HCL 2 MG/2ML IJ SOLN
INTRAMUSCULAR | Status: DC | PRN
  Administered 2017-11-25: 1 mg via INTRAVENOUS

## 2017-11-25 MED ORDER — MORPHINE SULFATE (PF) 2 MG/ML IV SOLN: 1.0000 mg | INTRAVENOUS | Status: DC | PRN

## 2017-11-25 MED ORDER — LIDOCAINE 2% (20 MG/ML) 5 ML SYRINGE
INTRAMUSCULAR | Status: DC | PRN
  Administered 2017-11-25: 60 mg via INTRAVENOUS

## 2017-11-25 MED ORDER — PHENYLEPHRINE 40 MCG/ML (10ML) SYRINGE FOR IV PUSH (FOR BLOOD PRESSURE SUPPORT)
PREFILLED_SYRINGE | INTRAVENOUS | Status: AC
  Filled 2017-11-25: qty 10

## 2017-11-25 MED ORDER — PHENYLEPHRINE 40 MCG/ML (10ML) SYRINGE FOR IV PUSH (FOR BLOOD PRESSURE SUPPORT)
PREFILLED_SYRINGE | INTRAVENOUS | Status: DC | PRN
  Administered 2017-11-25 (×3): 120 ug via INTRAVENOUS

## 2017-11-25 MED ORDER — LIDOCAINE 2% (20 MG/ML) 5 ML SYRINGE
INTRAMUSCULAR | Status: AC
  Filled 2017-11-25: qty 5

## 2017-11-25 MED ORDER — ACETAMINOPHEN 325 MG PO TABS: 650.0000 mg | ORAL_TABLET | ORAL | Status: DC | PRN

## 2017-11-25 MED ORDER — ONDANSETRON HCL 4 MG/2ML IJ SOLN
INTRAMUSCULAR | Status: DC | PRN
  Administered 2017-11-25: 4 mg via INTRAVENOUS

## 2017-11-25 MED ORDER — CIPROFLOXACIN IN D5W 400 MG/200ML IV SOLN
INTRAVENOUS | Status: AC
  Filled 2017-11-25: qty 200

## 2017-11-25 MED ORDER — PROPOFOL 10 MG/ML IV BOLUS
INTRAVENOUS | Status: DC | PRN
  Administered 2017-11-25: 100 mg via INTRAVENOUS

## 2017-11-25 MED ORDER — FENTANYL CITRATE (PF) 100 MCG/2ML IJ SOLN: 25.0000 ug | INTRAMUSCULAR | Status: DC | PRN

## 2017-11-25 MED ORDER — SODIUM CHLORIDE 0.9 % IV SOLN
INTRAVENOUS | Status: DC | PRN
  Administered 2017-11-25: 08:00:00

## 2017-11-25 MED ORDER — EPHEDRINE SULFATE-NACL 50-0.9 MG/10ML-% IV SOSY
PREFILLED_SYRINGE | INTRAVENOUS | Status: DC | PRN
  Administered 2017-11-25: 10 mg via INTRAVENOUS
  Administered 2017-11-25: 15 mg via INTRAVENOUS
  Administered 2017-11-25: 20 mg via INTRAVENOUS
  Administered 2017-11-25: 15 mg via INTRAVENOUS

## 2017-11-25 MED ORDER — ONDANSETRON HCL 4 MG/2ML IJ SOLN
INTRAMUSCULAR | Status: AC
  Filled 2017-11-25: qty 2

## 2017-11-25 MED ORDER — ACETAMINOPHEN 500 MG PO TABS
ORAL_TABLET | ORAL | Status: AC
  Administered 2017-11-25: 1000 mg via ORAL
  Filled 2017-11-25: qty 2

## 2017-11-25 MED ORDER — ACETAMINOPHEN 500 MG PO TABS
1000.0000 mg | ORAL_TABLET | ORAL | Status: AC
  Administered 2017-11-25: 1000 mg via ORAL

## 2017-11-25 MED ORDER — CARVEDILOL 3.125 MG PO TABS
6.2500 mg | ORAL_TABLET | Freq: Once | ORAL | Status: AC
  Administered 2017-11-25: 6.25 mg via ORAL
  Filled 2017-11-25: qty 2

## 2017-11-25 MED ORDER — SUCCINYLCHOLINE CHLORIDE 200 MG/10ML IV SOSY
PREFILLED_SYRINGE | INTRAVENOUS | Status: AC
  Filled 2017-11-25: qty 10

## 2017-11-25 MED ORDER — HEPARIN SOD (PORK) LOCK FLUSH 100 UNIT/ML IV SOLN
INTRAVENOUS | Status: AC
  Filled 2017-11-25: qty 5

## 2017-11-25 MED ORDER — SODIUM CHLORIDE 0.9% FLUSH: 3.0000 mL | INTRAVENOUS | Status: DC | PRN

## 2017-11-25 MED ORDER — LACTATED RINGERS IV SOLN
INTRAVENOUS | Status: DC | PRN
  Administered 2017-11-25: 07:00:00 via INTRAVENOUS

## 2017-11-25 MED ORDER — GABAPENTIN 100 MG PO CAPS
ORAL_CAPSULE | ORAL | Status: AC
  Administered 2017-11-25: 100 mg via ORAL
  Filled 2017-11-25: qty 1

## 2017-11-25 MED ORDER — SODIUM CHLORIDE 0.9 % IV SOLN: INTRAVENOUS | Status: DC | PRN

## 2017-11-25 MED ORDER — CIPROFLOXACIN IN D5W 400 MG/200ML IV SOLN
400.0000 mg | INTRAVENOUS | Status: AC
  Administered 2017-11-25: 400 mg via INTRAVENOUS

## 2017-11-25 MED ORDER — SODIUM CHLORIDE 0.9% FLUSH: 3.0000 mL | Freq: Two times a day (BID) | INTRAVENOUS | Status: DC

## 2017-11-25 MED ORDER — HYDROCODONE-ACETAMINOPHEN 5-325 MG PO TABS
ORAL_TABLET | ORAL | Status: AC
  Filled 2017-11-25: qty 1

## 2017-11-25 MED ORDER — BUPIVACAINE HCL 0.25 % IJ SOLN
INTRAMUSCULAR | Status: DC | PRN
  Administered 2017-11-25: 8 mL

## 2017-11-25 MED ORDER — FENTANYL CITRATE (PF) 250 MCG/5ML IJ SOLN
INTRAMUSCULAR | Status: DC | PRN
  Administered 2017-11-25: 50 ug via INTRAVENOUS

## 2017-11-25 MED ORDER — FENTANYL CITRATE (PF) 250 MCG/5ML IJ SOLN
INTRAMUSCULAR | Status: AC
  Filled 2017-11-25: qty 5

## 2017-11-25 MED ORDER — OXYCODONE HCL 5 MG PO TABS: 5.0000 mg | ORAL_TABLET | ORAL | Status: DC | PRN

## 2017-11-25 MED ORDER — DEXAMETHASONE SODIUM PHOSPHATE 10 MG/ML IJ SOLN
INTRAMUSCULAR | Status: AC
  Filled 2017-11-25: qty 1

## 2017-11-25 MED ORDER — EPHEDRINE 5 MG/ML INJ
INTRAVENOUS | Status: AC
  Filled 2017-11-25: qty 20

## 2017-11-25 MED ORDER — SODIUM CHLORIDE 0.9 % IV SOLN: 250.0000 mL | INTRAVENOUS | Status: DC | PRN

## 2017-11-25 MED ORDER — HEPARIN SOD (PORK) LOCK FLUSH 100 UNIT/ML IV SOLN
INTRAVENOUS | Status: DC | PRN
  Administered 2017-11-25: 500 [IU] via INTRAVENOUS

## 2017-11-25 MED ORDER — MIDAZOLAM HCL 2 MG/2ML IJ SOLN
INTRAMUSCULAR | Status: AC
  Filled 2017-11-25: qty 2

## 2017-11-25 MED ORDER — SODIUM CHLORIDE 0.9 % IV SOLN: INTRAVENOUS | Status: DC

## 2017-11-25 SURGICAL SUPPLY — 39 items
ADH SKN CLS APL DERMABOND .7 (GAUZE/BANDAGES/DRESSINGS) ×1
BAG DECANTER FOR FLEXI CONT (MISCELLANEOUS) ×5 IMPLANT
CHLORAPREP W/TINT 10.5 ML (MISCELLANEOUS) ×3 IMPLANT
COVER SURGICAL LIGHT HANDLE (MISCELLANEOUS) ×3 IMPLANT
COVER TRANSDUCER ULTRASND GEL (DRAPE) ×3 IMPLANT
COVER WAND RF STERILE (DRAPES) ×3 IMPLANT
CRADLE DONUT ADULT HEAD (MISCELLANEOUS) ×3 IMPLANT
DECANTER SPIKE VIAL GLASS SM (MISCELLANEOUS) ×3 IMPLANT
DERMABOND ADVANCED (GAUZE/BANDAGES/DRESSINGS) ×2
DERMABOND ADVANCED .7 DNX12 (GAUZE/BANDAGES/DRESSINGS) ×1 IMPLANT
DRAPE C-ARM 42X72 X-RAY (DRAPES) ×3 IMPLANT
DRAPE CHEST BREAST 15X10 FENES (DRAPES) ×3 IMPLANT
DRSG TEGADERM 2-3/8X2-3/4 SM (GAUZE/BANDAGES/DRESSINGS) ×2 IMPLANT
DRSG TEGADERM 4X4.75 (GAUZE/BANDAGES/DRESSINGS) ×4 IMPLANT
ELECT CAUTERY BLADE 6.4 (BLADE) ×3 IMPLANT
ELECT REM PT RETURN 9FT ADLT (ELECTROSURGICAL) ×3
ELECTRODE REM PT RTRN 9FT ADLT (ELECTROSURGICAL) ×1 IMPLANT
GAUZE 4X4 16PLY RFD (DISPOSABLE) ×3 IMPLANT
GAUZE SPONGE 4X4 12PLY STRL (GAUZE/BANDAGES/DRESSINGS) ×3 IMPLANT
GEL ULTRASOUND 20GR AQUASONIC (MISCELLANEOUS) ×3 IMPLANT
GLOVE BIO SURGEON STRL SZ7 (GLOVE) ×3 IMPLANT
GLOVE BIOGEL PI IND STRL 7.5 (GLOVE) ×1 IMPLANT
GLOVE BIOGEL PI INDICATOR 7.5 (GLOVE) ×2
GOWN STRL REUS W/ TWL LRG LVL3 (GOWN DISPOSABLE) ×2 IMPLANT
GOWN STRL REUS W/TWL LRG LVL3 (GOWN DISPOSABLE) ×6
KIT BASIN OR (CUSTOM PROCEDURE TRAY) ×3 IMPLANT
KIT PORT POWER 8FR ISP CVUE (Port) ×3 IMPLANT
KIT TURNOVER KIT B (KITS) ×3 IMPLANT
NS IRRIG 1000ML POUR BTL (IV SOLUTION) ×3 IMPLANT
PAD ARMBOARD 7.5X6 YLW CONV (MISCELLANEOUS) ×6 IMPLANT
PENCIL BUTTON HOLSTER BLD 10FT (ELECTRODE) ×3 IMPLANT
SUT MNCRL AB 4-0 PS2 18 (SUTURE) ×3 IMPLANT
SUT PROLENE 2 0 SH DA (SUTURE) ×3 IMPLANT
SUT VIC AB 3-0 SH 27 (SUTURE) ×3
SUT VIC AB 3-0 SH 27XBRD (SUTURE) ×1 IMPLANT
SYR 5ML LUER SLIP (SYRINGE) ×3 IMPLANT
TOWEL OR 17X24 6PK STRL BLUE (TOWEL DISPOSABLE) ×3 IMPLANT
TOWEL OR 17X26 10 PK STRL BLUE (TOWEL DISPOSABLE) ×3 IMPLANT
TRAY LAPAROSCOPIC MC (CUSTOM PROCEDURE TRAY) ×3 IMPLANT

## 2017-11-25 NOTE — Anesthesia Procedure Notes (Signed)
Procedure Name: LMA Insertion Date/Time: 11/25/2017 7:23 AM Performed by: Marsa Aris, CRNA Pre-anesthesia Checklist: Patient identified, Emergency Drugs available, Suction available and Patient being monitored Patient Re-evaluated:Patient Re-evaluated prior to induction Oxygen Delivery Method: Circle System Utilized Preoxygenation: Pre-oxygenation with 100% oxygen Induction Type: IV induction Ventilation: Mask ventilation without difficulty LMA: LMA inserted LMA Size: 4.0 Number of attempts: 1 Airway Equipment and Method: Bite block Placement Confirmation: positive ETCO2 Tube secured with: Tape Dental Injury: Teeth and Oropharynx as per pre-operative assessment  Comments: No change in dentition from pre-procedure

## 2017-11-25 NOTE — Discharge Instructions (Signed)
    PORT-A-CATH: POST OP INSTRUCTIONS  Always review your discharge instruction sheet given to you by the facility where your surgery was performed.   1. A prescription for pain medication may be given to you upon discharge. Take your pain medication as prescribed, if needed. If narcotic pain medicine is not needed, then you make take acetaminophen (Tylenol) or ibuprofen (Advil) as needed.  2. Take your usually prescribed medications unless otherwise directed. 3. If you need a refill on your pain medication, please contact our office. All narcotic pain medicine now requires a paper prescription.  Phoned in and fax refills are no longer allowed by law.  Prescriptions will not be filled after 5 pm or on weekends.  4. You should follow a light diet for the remainder of the day after your procedure. 5. Most patients will experience some mild swelling and/or bruising in the area of the incision. It may take several days to resolve. 6. It is common to experience some constipation if taking pain medication after surgery. Increasing fluid intake and taking a stool softener (such as Colace) will usually help or prevent this problem from occurring. A mild laxative (Milk of Magnesia or Miralax) should be taken according to package directions if there are no bowel movements after 48 hours.  7. Unless discharge instructions indicate otherwise, you may remove your bandages 48 hours after surgery, and you may shower at that time. You may have steri-strips (small white skin tapes) in place directly over the incision.  These strips should be left on the skin for 7-10 days.  If your surgeon used Dermabond (skin glue) on the incision, you may shower in 24 hours.  The glue will flake off over the next 2-3 weeks.  8. If your port is left accessed at the end of surgery (needle left in port), the dressing cannot get wet and should only by changed by a healthcare professional. When the port is no longer accessed (when the  needle has been removed), follow step 7.   9. ACTIVITIES:  Limit activity involving your arms for the next 72 hours. Do no strenuous exercise or activity for 1 week. You may drive when you are no longer taking prescription pain medication, you can comfortably wear a seatbelt, and you can maneuver your car. 10.You may need to see your doctor in the office for a follow-up appointment.  Please       check with your doctor.  11.When you receive a new Port-a-Cath, you will get a product guide and        ID card.  Please keep them in case you need them.  WHEN TO CALL YOUR DOCTOR ([phone]): 1. Fever over 101.0 2. Chills 3. Continued bleeding from incision 4. Increased redness and tenderness at the site 5. Shortness of breath, difficulty breathing   The clinic staff is available to answer your questions during regular business hours. Please don't hesitate to call and ask to speak to one of the nurses or medical assistants for clinical concerns. If you have a medical emergency, go to the nearest emergency room or call 911.  A surgeon from [facility] is always on call at the hospital.     For further information, please visit [email or web address]      

## 2017-11-25 NOTE — Anesthesia Postprocedure Evaluation (Signed)
Anesthesia Post Note  Patient: [name]  Procedure(s) Performed: INSERTION PORT-A-CATH WITH [place] (Right Chest)     Patient location during evaluation: PACU Anesthesia Type: General Level of consciousness: awake and alert Pain management: pain level controlled Vital Signs Assessment: post-procedure vital signs reviewed and stable Respiratory status: spontaneous breathing, nonlabored ventilation and respiratory function stable Cardiovascular status: blood pressure returned to baseline and stable Postop Assessment: no apparent nausea or vomiting Anesthetic complications: no    Last Vitals:  Vitals:   11/25/17 0900 11/25/17 0915  BP: 101/66 103/70  Pulse: (!) 54 74  Resp: 18 16  Temp: (!) 36.1 C (!) 36.1 C  SpO2: 98% 95%    Last Pain:  Vitals:   11/25/17 0900  TempSrc:   PainSc: 0-No pain                 Lynda Rainwater

## 2017-11-25 NOTE — Transfer of Care (Signed)
Immediate Anesthesia Transfer of Care Note  Patient: [name]  Procedure(s) Performed: INSERTION PORT-A-CATH WITH [place] (Right Chest)  Patient Location: PACU  Anesthesia Type:General  Level of Consciousness: awake, alert  and oriented  Airway & Oxygen Therapy: Patient Spontanous Breathing  Post-op Assessment: Report given to RN and Post -op Vital signs reviewed and stable  Post vital signs: Reviewed and stable  Last Vitals:  Vitals Value Taken Time  BP    Temp    Pulse    Resp    SpO2      Last Pain:  Vitals:   11/25/17 0600  TempSrc: Oral  PainSc:       Patients Stated Pain Goal: 0 (59/93/57 0177)  Complications: No apparent anesthesia complications

## 2017-11-25 NOTE — H&P (Signed)
[name] is an 77 y.o. female.   Chief Complaint: breast cancer, stage IV HPI: 40 yof with prior left breast cancer treated now returns with stage IV disease. This is her2 positive. Due to start chemo/anti her 2 therapy tomorrow. Prior right [place] port by dr Margot Chimes and prior left [facility].  Past Medical History:  Diagnosis Date  . ALLERGIC RHINITIS 12/13/2007  . ANXIETY 06/04/2008  . Blood transfusion 1964   post childbirth  . 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 Chronic combined systolic (congestive) and diastolic (congestive) heart failure ([place])   . DEGENERATIVE JOINT DISEASE 12/17/2006  . DEPRESSION 08/10/2007  . Dilated cardiomyopathy ([place]) 05/29/2015   a. Dx 05/2015 - EF 35-40% - felt secondary to XRT and chemo for breast CA. Normal coronaries by cath.  . DM type 2 (diabetes mellitus, type 2) ([place])   . Dyspnea    sometimes when walking , sometimes when sitting  . Essential hypertension   . GERD 12/17/2006  . History of blood transfusion    after child birth  . History of chemotherapy   . History of hiatal hernia   . History of radiation therapy 10/07/11-11/24/11   left breast,5040 cGy/28 sessions,l chest wall boost=1000cGy/5 sesions  . 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 Hyperlipidemia   . 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 OBESITY 06/04/2008  . 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 Pneumonia   . Premature atrial contractions   . Sinus bradycardia    a. 2018 event monitor which showed NSR with average HR 71, frequent PACs, occasional PVCs, and sinus bradycardia as low as 50.    Past Surgical History:  Procedure Laterality Date  . bladder tack  1974  . BREAST EXCISIONAL BIOPSY  11/10/1988   left benign Dr Margot Chimes  . BREAST EXCISIONAL BIOPSY  03/04/1983   Right - two areas - Dr Margot Chimes  . BREAST EXCISIONAL BIOPSY  10/09/1980   right - Dr Margot Chimes  . BREAST EXCISIONAL BIOPSY  10/18/1979   left - Dr Margot Chimes  . BREAST EXCISIONAL BIOPSY   01/02/1994   right - Dr Margot Chimes  . BREAST EXCISIONAL BIOPSY  2017   right  . BREAST SURGERY    . CARDIAC CATHETERIZATION N/A 05/29/2015   Procedure: Left Heart Cath and Coronary Angiography;  Surgeon: Belva Crome, MD;  Location: [facility];  Service: Cardiovascular;  Laterality: N/A;  . CHOLECYSTECTOMY  02/23/1991   [facility] - Dr Margot Chimes  . COLONOSCOPY    . ENTEROCELE REPAIR  06/08   Dr. Cletis Media  . EVACUATION BREAST HEMATOMA  07/01/2011   Procedure: EVACUATION HEMATOMA BREAST;  Surgeon: Haywood Lasso, MD;  Location: [facility];  Service: General;  Laterality: Left;  Drainage of left mastectomy seroma  . EYE SURGERY Right    catartact  . HIP SURGERY Right 08/27/2010   DR. Maureen Ralphs-  "burcitis removal"  . LAMINECTOMY  1993   s/p-Dr. Rita Ohara  . MASTECTOMY Left 03/23/2011  . MODIFIED RADICAL MASTECTOMY W/ AXILLARY LYMPH NODE DISSECTION Left 03-30-11  . PORT-A-CATH REMOVAL  12/02/2011   Procedure: REMOVAL PORT-A-CATH;  Surgeon: Haywood Lasso, MD;  Location: [facility];  Service: General;  Laterality: N/A;  . PORTACATH PLACEMENT  04/13/2011   Procedure: INSERTION PORT-A-CATH;  Surgeon: Haywood Lasso, MD;  Location: [facility];  Service: General;  Laterality: N/A;  porta cath placement,removal of J-P  drain  . RADIOACTIVE SEED GUIDED EXCISIONAL BREAST BIOPSY Right 08/09/2015   Procedure: RIGHT RADIOACTIVE SEED GUIDED EXCISIONAL BREAST BIOPSY, RIGHT NIPPLE LESION EXCISION;  Surgeon: Rolm Bookbinder, MD;  Location: [facility];  Service: General;  Laterality: Right;  . ROTATOR CUFF REPAIR  8/03   right, Dr. Tonita Cong  . ROTATOR CUFF REPAIR  10/26/2008   left, Dr. Rush Farmer  . TONSILLECTOMY    . VAGINAL HYSTERECTOMY  1974   partial     Family History  Problem Relation Age of Onset  . COPD Mother   . Arthritis Mother   . Emphysema Mother   . Diabetes Brother   . Cancer Brother        prostate  . Cancer Father        malignant brain tumor   . Mental illness Paternal Grandfather   . Heart disease Other        Sibling  . Diabetes Other        Sibling    Social History:  reports that she has never smoked. She has never used smokeless tobacco. She reports that she does not drink alcohol or use drugs.  Allergies:  Allergies  Allergen Reactions  . Cephalexin Hives    REACTION: hives  . Clindamycin Hives    REACTION: hives  . Oxycodone-Acetaminophen Hives and Itching  . Rocephin [Ceftriaxone Sodium In Dextrose] Hives  . Sulfonamide Derivatives Hives    REACTION: hives  . Dilaudid [Hydromorphone Hcl] Itching    Medications Prior to Admission  Medication Sig Dispense Refill  . acyclovir (ZOVIRAX) 200 MG capsule TAKE 2 CAPSULES BY MOUTH 3 TIMES DAILY (Patient taking differently: Take 400 mg by mouth 3 (three) times daily. Patient takes 2 tables 1 time a day) 540 capsule 3  . atorvastatin (LIPITOR) 40 MG tablet Take 40 mg by mouth daily.    . carvedilol (COREG) 6.25 MG tablet TAKE 1 TABLET TWICE DAILY WITH A MEAL (Patient taking differently: Take 6.25 mg by mouth 2 (two) times daily with a meal. ) 180 tablet 1  . celecoxib (CELEBREX) 200 MG capsule Take 200 mg by mouth daily as needed for pain. And inflammation    . cholecalciferol (VITAMIN D) 1000 UNITS tablet Take 1 tablet (1,000 Units total) by mouth daily. 90 tablet 3  . cyanocobalamin 500 MCG tablet Take 1 tablet (500 mcg total) by mouth daily. 90 tablet 3  . HYDROcodone-acetaminophen (NORCO) 5-325 MG tablet Take 1 tablet by mouth every 4 (four) hours as needed. 20 tablet 0  . letrozole (FEMARA) 2.5 MG tablet TAKE ONE TABLET BY MOUTH DAILY. 90 tablet 3  . Liraglutide (VICTOZA) 18 MG/3ML SOPN Inject 3 mLs (18 mg total) into the skin every morning. (Patient taking differently: Inject 1.8 mg into the skin every morning. ) 6 mL   . losartan (COZAAR) 100 MG tablet Take 1 tablet (100 mg total) by mouth daily. 90 tablet 3  . omeprazole (PRILOSEC) 40 MG capsule Take 40 mg by mouth  daily.    . sertraline (ZOLOFT) 50 MG tablet Take 1 tablet (50 mg total) by mouth daily. 90 tablet 3  . spironolactone (ALDACTONE) 25 MG tablet TAKE 1 TABLET BY MOUTH EVERY DAY 30 tablet 6  . tolterodine (DETROL LA) 4 MG 24 hr capsule Take 1 capsule by mouth daily.    Marland Kitchen ALPRAZolam (XANAX) 0.5 MG tablet TAKE ONE-HALF TO ONE TABLET BY MOUTH THREE TIMES DAILY AS NEEDED FOR  NERVES 270 tablet 1  . dexamethasone (  DECADRON) 4 MG tablet Take 1 tablet (4 mg total) by mouth daily. 1 tablet day before chemo and 1 tablet day after chemo 15 tablet 0  . fluconazole (DIFLUCAN) 100 MG tablet Take 1 tablet (100 mg total) by mouth daily. (Patient not taking: Reported on 11/15/2017) 7 tablet 0  . Insulin Pen Needle (RELION PEN NEEDLE 31G/8MM) 31G X 8 MM MISC 1 Device by Does not apply route 4 (four) times daily. 120 each 11  . lidocaine-prilocaine (EMLA) cream Apply to affected area once 30 g 3  . LORazepam (ATIVAN) 0.5 MG tablet Take 1 tablet (0.5 mg total) by mouth at bedtime as needed for sleep. 30 tablet 0  . magnesium oxide (MAG-OX) 400 MG tablet Take 1 tablet (400 mg total) by mouth daily. 30 tablet 9  . Multiple Vitamins-Minerals (ONE-A-DAY WOMENS 50+ ADVANTAGE) TABS Take 1 tablet by mouth daily. 90 tablet 3  . ondansetron (ZOFRAN) 8 MG tablet Take 1 tablet (8 mg total) by mouth 2 (two) times daily as needed for refractory nausea / vomiting. 30 tablet 1  . prochlorperazine (COMPAZINE) 10 MG tablet Take 1 tablet (10 mg total) by mouth every 6 (six) hours as needed (Nausea or vomiting). 30 tablet 1    Results for orders placed or performed during the hospital encounter of 11/23/17 (from the past 48 hour(s))  Glucose, capillary     Status: Abnormal   Collection Time: 11/23/17  1:09 PM  Result Value Ref Range   Glucose-Capillary 128 (H) 70 - 99 mg/dL   No results found.  Review of Systems  All other systems reviewed and are negative.   Blood pressure 121/66, pulse 83, temperature (!) 97.5 F (36.4 C),  temperature source Oral, resp. rate 16, height '5\' 8"'$  (1.727 m), weight 75.8 kg, SpO2 97 %. Physical Exam  Constitutional: She appears well-developed and well-nourished.  HENT:  Head: Normocephalic and atraumatic.  Cardiovascular: Normal rate and regular rhythm.  Respiratory: Effort normal and breath sounds normal.  Left chest and right chest incisions healed      Assessment/Plan Breast cancer, stage IV Port placement for systemic therapy  Rolm Bookbinder, MD 11/25/2017, 6:22 AM

## 2017-11-25 NOTE — Op Note (Signed)
Preoperative diagnosis: stage IV breast cancer Postoperative diagnosis: same as above Procedure: right ij US guided powerport insertion Surgeon: Dr Serita Grammes EBL: minimal Anes: general  Specimensnone Complications none Drains none Sponge count correct Dispo to pacu stable  Indications: This is a55 yof prior left breast cancer in 2013 now with stage IV her 2 positive disease.  She is due to begin chemotherapy tomorrow.  We discussed port placement.  Procedure: After informed consent was obtained the patient was taken to the operating room. She was given antibiotics. Sequential compression devices were on her legs. She was then placed under general anesthesia with an LMA. Then she was prepped and draped in the standard sterile surgical fashion. Surgical timeout was then performed.  Ithenused the ultrasound to identify the right internal jugular vein. I then accessed the vein using the ultrasound.This aspirated blood. I then placed the wire. This was confirmed by fluoroscopy and ultrasound to be in the correct position.I tunneled the line between the 2 sites.I then dilated the tract and placed the dilator assembly with the sheath. This was done under fluoroscopy. I then removed the sheath and dilator. The wire was also removed. The line was then pulled back to be in the venacava. I hooked this up to the port. I sutured this into place with 2-0 Prolene in 2 places. This aspirated blood and flushed easily.This was confirmed with a final fluoroscopy. I then closed this with 2-0 Vicryl and 4-0 Monocryl.I placed the access needle and left in place for treatment tomorrow.This withdrew blood and I placed heparin in it.Dermabond was placed on both the incisions.A dressing was placed. She tolerated this well and was transferred to the recovery room in stable condition

## 2017-11-25 NOTE — Interval H&P Note (Signed)
History and Physical Interval Note:  11/25/2017 6:24 AM  [name]  has presented today for surgery, with the diagnosis of BREAST CANCER  The various methods of treatment have been discussed with the patient and family. After consideration of risks, benefits and other options for treatment, the patient has consented to  Procedure(s): INSERTION PORT-A-CATH WITH [place] (N/A) as a surgical intervention .  The patient's history has been reviewed, patient examined, no change in status, stable for surgery.  I have reviewed the patient's chart and labs.  Questions were answered to the patient's satisfaction.     Rolm Bookbinder

## 2017-11-26 ENCOUNTER — Encounter: Payer: Self-pay | Admitting: *Deleted

## 2017-11-26 ENCOUNTER — Ambulatory Visit: Payer: Medicare HMO | Admitting: Hematology and Oncology

## 2017-11-26 ENCOUNTER — Inpatient Hospital Stay: Payer: Medicare HMO

## 2017-11-26 ENCOUNTER — Telehealth: Payer: Self-pay | Admitting: Hematology and Oncology

## 2017-11-26 ENCOUNTER — Encounter (HOSPITAL_COMMUNITY): Payer: Self-pay | Admitting: General Surgery

## 2017-11-26 ENCOUNTER — Inpatient Hospital Stay (HOSPITAL_BASED_OUTPATIENT_CLINIC_OR_DEPARTMENT_OTHER): Payer: Medicare HMO | Admitting: Hematology and Oncology

## 2017-11-26 VITALS — BP 118/78 | HR 55 | Temp 97.6°F | Resp 16

## 2017-11-26 DIAGNOSIS — Z923 Personal history of irradiation: Secondary | ICD-10-CM

## 2017-11-26 DIAGNOSIS — Z79811 Long term (current) use of aromatase inhibitors: Secondary | ICD-10-CM

## 2017-11-26 DIAGNOSIS — C787 Secondary malignant neoplasm of liver and intrahepatic bile duct: Secondary | ICD-10-CM | POA: Diagnosis not present

## 2017-11-26 DIAGNOSIS — C50312 Malignant neoplasm of lower-inner quadrant of left female breast: Secondary | ICD-10-CM

## 2017-11-26 DIAGNOSIS — Z79899 Other long term (current) drug therapy: Secondary | ICD-10-CM

## 2017-11-26 DIAGNOSIS — Z9012 Acquired absence of left breast and nipple: Secondary | ICD-10-CM

## 2017-11-26 DIAGNOSIS — Z170 Estrogen receptor positive status [ER+]: Secondary | ICD-10-CM | POA: Diagnosis not present

## 2017-11-26 DIAGNOSIS — Z5111 Encounter for antineoplastic chemotherapy: Secondary | ICD-10-CM | POA: Diagnosis not present

## 2017-11-26 LAB — CMP (CANCER CENTER ONLY)
ALT: 93 U/L — ABNORMAL HIGH (ref 0–44)
AST: 83 U/L — ABNORMAL HIGH (ref 15–41)
Albumin: 3.2 g/dL — ABNORMAL LOW (ref 3.5–5.0)
Alkaline Phosphatase: 148 U/L — ABNORMAL HIGH (ref 38–126)
Anion gap: 11 (ref 5–15)
BUN: 13 mg/dL (ref 8–23)
CO2: 28 mmol/L (ref 22–32)
Calcium: 10.6 mg/dL — ABNORMAL HIGH (ref 8.9–10.3)
Chloride: 102 mmol/L (ref 98–111)
Creatinine: 0.84 mg/dL (ref 0.44–1.00)
GFR, Est AFR Am: >60 mL/min (ref >60)
GFR, Estimated: >60 mL/min (ref >60)
Glucose, Bld: 207 mg/dL — ABNORMAL HIGH (ref 70–99)
Potassium: 4.7 mmol/L (ref 3.5–5.1)
Sodium: 141 mmol/L (ref 135–145)
Total Bilirubin: 0.4 mg/dL (ref 0.3–1.2)
Total Protein: 6.7 g/dL (ref 6.5–8.1)

## 2017-11-26 LAB — CBC WITH DIFFERENTIAL (CANCER CENTER ONLY)
ABS IMMATURE GRANULOCYTES: 0.04 10*3/uL (ref 0.00–0.07)
BASOS PCT: 0 %
Basophils Absolute: 0 10*3/uL (ref 0.0–0.1)
Eosinophils Absolute: 0 10*3/uL (ref 0.0–0.5)
Eosinophils Relative: 0 %
HCT: 42 % (ref 36.0–46.0)
Hemoglobin: 13.7 g/dL (ref 12.0–15.0)
Immature Granulocytes: 0 %
LYMPHS PCT: 8 %
Lymphs Abs: 0.8 10*3/uL (ref 0.7–4.0)
MCH: 30 pg (ref 26.0–34.0)
MCHC: 32.6 g/dL (ref 30.0–36.0)
MCV: 92.1 fL (ref 80.0–100.0)
MONO ABS: 0.8 10*3/uL (ref 0.1–1.0)
MONOS PCT: 8 %
NEUTROS ABS: 8.2 10*3/uL — AB (ref 1.7–7.7)
Neutrophils Relative %: 84 %
PLATELETS: 217 10*3/uL (ref 150–400)
RBC: 4.56 MIL/uL (ref 3.87–5.11)
RDW: 13.3 % (ref 11.5–15.5)
WBC Count: 9.9 10*3/uL (ref 4.0–10.5)
nRBC: 0 % (ref 0.0–0.2)

## 2017-11-26 MED ORDER — HEPARIN SOD (PORK) LOCK FLUSH 100 UNIT/ML IV SOLN
500.0000 [IU] | Freq: Once | INTRAVENOUS | Status: AC | PRN
  Administered 2017-11-26: 500 [IU]
  Filled 2017-11-26: qty 5

## 2017-11-26 MED ORDER — SODIUM CHLORIDE 0.9% FLUSH
10.0000 mL | INTRAVENOUS | Status: DC | PRN
  Administered 2017-11-26: 10 mL
  Filled 2017-11-26: qty 10

## 2017-11-26 MED ORDER — DEXAMETHASONE SODIUM PHOSPHATE 10 MG/ML IJ SOLN
10.0000 mg | Freq: Once | INTRAMUSCULAR | Status: AC
  Administered 2017-11-26: 10 mg via INTRAVENOUS

## 2017-11-26 MED ORDER — DEXAMETHASONE SODIUM PHOSPHATE 10 MG/ML IJ SOLN
INTRAMUSCULAR | Status: AC
  Filled 2017-11-26: qty 1

## 2017-11-26 MED ORDER — SODIUM CHLORIDE 0.9 % IV SOLN
75.0000 mg/m2 | Freq: Once | INTRAVENOUS | Status: AC
  Administered 2017-11-26: 140 mg via INTRAVENOUS
  Filled 2017-11-26: qty 14

## 2017-11-26 MED ORDER — SODIUM CHLORIDE 0.9 % IV SOLN
Freq: Once | INTRAVENOUS | Status: AC
  Administered 2017-11-26: 13:00:00 via INTRAVENOUS
  Filled 2017-11-26: qty 250

## 2017-11-26 NOTE — Progress Notes (Signed)
Per Dr Gudena OK for taxotere with AST 83, ALT 93 and Alk Phos 148 

## 2017-11-26 NOTE — Patient Instructions (Signed)
[facility] Discharge Instructions for Patients Receiving Chemotherapy  Today you received the following chemotherapy agents:  Taxotere, (docetaxel)  To help prevent nausea and vomiting after your treatment, we encourage you to take your nausea medication as prescribed.   If you develop nausea and vomiting that is not controlled by your nausea medication, call the clinic.   BELOW ARE SYMPTOMS THAT SHOULD BE REPORTED IMMEDIATELY:  *FEVER GREATER THAN 100.5 F  *CHILLS WITH OR WITHOUT FEVER  NAUSEA AND VOMITING THAT IS NOT CONTROLLED WITH YOUR NAUSEA MEDICATION  *UNUSUAL SHORTNESS OF BREATH  *UNUSUAL BRUISING OR BLEEDING  TENDERNESS IN MOUTH AND THROAT WITH OR WITHOUT PRESENCE OF ULCERS  *URINARY PROBLEMS  *BOWEL PROBLEMS  UNUSUAL RASH Items with * indicate a potential emergency and should be followed up as soon as possible.  Feel free to call the clinic should you have any questions or concerns. The clinic phone number is [phone].  Please show the [facility] at check-in to the Emergency Department and triage nurse.     Docetaxel injection What is this medicine? DOCETAXEL (doe se TAX el) is a chemotherapy drug. It targets fast dividing cells, like cancer cells, and causes these cells to die. This medicine is used to treat many types of cancers like breast cancer, certain stomach cancers, head and neck cancer, lung cancer, and prostate cancer. This medicine may be used for other purposes; ask your health care provider or pharmacist if you have questions. COMMON BRAND NAME(S): Docefrez, Taxotere What should I tell my health care provider before I take this medicine? They need to know if you have any of these conditions: -infection (especially a virus infection such as chickenpox, cold sores, or herpes) -liver disease -low blood counts, like low white cell, platelet, or red cell counts -an unusual or allergic reaction to docetaxel, polysorbate 80,  other chemotherapy agents, other medicines, foods, dyes, or preservatives -pregnant or trying to get pregnant -breast-feeding How should I use this medicine? This drug is given as an infusion into a vein. It is administered in a hospital or clinic by a specially trained health care professional. Talk to your pediatrician regarding the use of this medicine in children. Special care may be needed. Overdosage: If you think you have taken too much of this medicine contact a poison control center or emergency room at once. NOTE: This medicine is only for you. Do not share this medicine with others. What if I miss a dose? It is important not to miss your dose. Call your doctor or health care professional if you are unable to keep an appointment. What may interact with this medicine? -cyclosporine -erythromycin -ketoconazole -medicines to increase blood counts like filgrastim, pegfilgrastim, sargramostim -vaccines Talk to your doctor or health care professional before taking any of these medicines: -acetaminophen -aspirin -ibuprofen -ketoprofen -naproxen This list may not describe all possible interactions. Give your health care provider a list of all the medicines, herbs, non-prescription drugs, or dietary supplements you use. Also tell them if you smoke, drink alcohol, or use illegal drugs. Some items may interact with your medicine. What should I watch for while using this medicine? Your condition will be monitored carefully while you are receiving this medicine. You will need important blood work done while you are taking this medicine. This drug may make you feel generally unwell. This is not uncommon, as chemotherapy can affect healthy cells as well as cancer cells. Report any side effects. Continue your course of treatment even  though you feel ill unless your doctor tells you to stop. In some cases, you may be given additional medicines to help with side effects. Follow all directions for  their use. Call your doctor or health care professional for advice if you get a fever, chills or sore throat, or other symptoms of a cold or flu. Do not treat yourself. This drug decreases your body's ability to fight infections. Try to avoid being around people who are sick. This medicine may increase your risk to bruise or bleed. Call your doctor or health care professional if you notice any unusual bleeding. This medicine may contain alcohol in the product. You may get drowsy or dizzy. Do not drive, use machinery, or do anything that needs mental alertness until you know how this medicine affects you. Do not stand or sit up quickly, especially if you are an older patient. This reduces the risk of dizzy or fainting spells. Avoid alcoholic drinks. Do not become pregnant while taking this medicine. Women should inform their doctor if they wish to become pregnant or think they might be pregnant. There is a potential for serious side effects to an unborn child. Talk to your health care professional or pharmacist for more information. Do not breast-feed an infant while taking this medicine. What side effects may I notice from receiving this medicine? Side effects that you should report to your doctor or health care professional as soon as possible: -allergic reactions like skin rash, itching or hives, swelling of the face, lips, or tongue -low blood counts - This drug may decrease the number of white blood cells, red blood cells and platelets. You may be at increased risk for infections and bleeding. -signs of infection - fever or chills, cough, sore throat, pain or difficulty passing urine -signs of decreased platelets or bleeding - bruising, pinpoint red spots on the skin, black, tarry stools, nosebleeds -signs of decreased red blood cells - unusually weak or tired, fainting spells, lightheadedness -breathing problems -fast or irregular heartbeat -low blood pressure -mouth sores -nausea and  vomiting -pain, swelling, redness or irritation at the injection site -pain, tingling, numbness in the hands or feet -swelling of the ankle, feet, hands -weight gain Side effects that usually do not require medical attention (report to your doctor or health care professional if they continue or are bothersome): -bone pain -complete hair loss including hair on your head, underarms, pubic hair, eyebrows, and eyelashes -diarrhea -excessive tearing -changes in the color of fingernails -loosening of the fingernails -nausea -muscle pain -red flush to skin -sweating -weak or tired This list may not describe all possible side effects. Call your doctor for medical advice about side effects. You may report side effects to FDA at 1-800-FDA-1088. Where should I keep my medicine? This drug is given in a hospital or clinic and will not be stored at home. NOTE: This sheet is a summary. It may not cover all possible information. If you have questions about this medicine, talk to your doctor, pharmacist, or health care provider.  2018 Elsevier/Gold Standard (2015-02-21 12:32:56)

## 2017-11-26 NOTE — Assessment & Plan Note (Signed)
Left breast invasive lobular cancer diagnosed 02/20/2011 status post left mastectomy with axillary lymph node dissection, 6 cm ILC with LVI, PNI, 9/16 lymph nodes positive with extracapsular extension, ER 100%, PR 100%, HER-2 -1.31, Ki-67 18%, T3a N2 a M0 stage IIIa status post F AC chemotherapy 4 followed by radiation and currently on letrozole 2.5 mg daily since 10/16/2011  Relapse/recurrence: Elevated LFTs led to ultrasound of the liver which revealed liver metastases September 2019  PET CT scan 11/04/2017: Heavy burden of metastatic disease to the liver involving all lobes, largest 7.7 x 5.4 cm with SUV of 16.6, additional hypermetabolic left supraclavicular, left subpectoral, portacaval and aortocaval lymphadenopathy  Liver biopsy: 11/17/2017: Metastatic breast cancer ER 100%, PR 0%, HER-2 3+ positive  Recommendation: 1.  Systemic chemotherapy  Taxotere Herceptin and Perjeta  We will hold off on Herceptin and Perjeta because of low cardiac ejection fraction.  She has appointment to see cardiology regarding this.  We decided to hold off on pursuing the clinical trial because the time it would take for her to get started on the trial is going to delay her treatment.  Return to clinic in 1 week for toxicity check.

## 2017-11-26 NOTE — Telephone Encounter (Signed)
Gave patient avs and calendar.   °

## 2017-11-26 NOTE — Progress Notes (Signed)
Patient Care Team: Vicenta Aly, FNP as PCP - General (Nurse Practitioner) Neldon Mc, MD as Surgeon (General Surgery) Sueanne Margarita, MD as Consulting Physician (Cardiology)  DIAGNOSIS:  Encounter Diagnosis  Name Primary?  . 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 Her clinical staging was stage IIB (T3, N0, M0).      03/23/2011 Surgery    L mastectomy + Axillary LND 6 cm ILC with LVI, PNI; 9/16 LN positive with Extracapsular ext ER 100%; PR 100%; Her 2 1.31; Ki 67: 18; pT3a N2a (stage IIIA)    04/17/2011 - 06/19/2011 Chemotherapy    FEC X 4 complicated by febrile neutropenis, spesis and chest wall abscess    10/07/2011 - 11/24/2011 Radiation Therapy    Left chest wall and axilla 5040 cGy in 28 # with Boost    10/16/2011 -  Anti-estrogen oral therapy    Letrozole 2.5 mg po daily    05/27/2015 - 05/30/2015 Hospital Admission    Acute systolic CHF, dilated cardiomyopathy, EF 35-40%    08/09/2015 Surgery    Right lumpectomy: Fibrocystic changes with calcifications, right nipple lesion: Seborrheic keratosis no evidence of malignancy    10/20/2017 Imaging    Elevated LFTs letter ultrasound of the liver which showed liver metastases    11/04/2017 PET scan    Heavy burden of metastatic disease to the liver involving all lobes, index 7.7 centimeters right hepatic lobe mass SUV 16.6, small but hypermetabolic left supraclavicular, left subpectoral, portacaval, aortocaval adenopathy    11/12/2017 Pathology Results    Liver biopsy: Metastatic breast cancer, ER 100%, PR 0%, Ki-67 10%, HER-2 3+ positive    11/19/2017 -  Chemotherapy    The patient had pegfilgrastim-cbqv (UDENYCA) injection 6 mg, 6 mg,  Subcutaneous, Once, 0 of 6 cycles trastuzumab (HERCEPTIN) 609 mg in sodium chloride 0.9 % 250 mL chemo infusion, 8 mg/kg = 609 mg, Intravenous,  Once, 0 of 6 cycles DOCEtaxel (TAXOTERE) 140 mg in sodium chloride 0.9 % 250 mL chemo infusion, 75 mg/m2 = 140 mg, Intravenous,  Once, 0 of 6 cycles pertuzumab (PERJETA) 840 mg in sodium chloride 0.9 % 250 mL chemo infusion, 840 mg, Intravenous, Once, 0 of 6 cycles  for chemotherapy treatment.      CHIEF COMPLIANT: Patient is here for cycle 1 of chemo  INTERVAL HISTORY: [name] is a 77 year old with above-mentioned history metastatic breast cancer HER-2 positive disease who is here today to receive her first cycle of chemotherapy with Taxotere.  Because of decrease in ejection fraction to 30 to 35%, we decided to hold off on Herceptin Perjeta at this time.  She has appointment to see cardiology.  We also decided against doing clinical trial because of the timing issues.  REVIEW OF SYSTEMS:   Constitutional: Denies fevers, chills or abnormal weight loss Eyes: Denies blurriness of vision Ears, nose, mouth, throat, and face: Denies mucositis or sore throat Respiratory: Denies cough, dyspnea or wheezes Cardiovascular: Denies palpitation, chest discomfort Gastrointestinal:  Denies nausea, heartburn or change in bowel habits Skin: Denies abnormal skin rashes Lymphatics: Denies new lymphadenopathy or easy bruising Neurological:Denies numbness, tingling or new weaknesses Behavioral/Psych: Mood is stable, no new changes  Extremities: No lower extremity edema  All other systems were  reviewed with the patient and are negative.  I have reviewed the past medical history, past surgical history, social history and family history with the patient and they are unchanged from previous note.  ALLERGIES:  is allergic to cephalexin; clindamycin; oxycodone-acetaminophen; rocephin [ceftriaxone sodium in dextrose]; sulfonamide derivatives; and dilaudid  [hydromorphone hcl].  MEDICATIONS:  Current Outpatient Medications  Medication Sig Dispense Refill  . acyclovir (ZOVIRAX) 200 MG capsule TAKE 2 CAPSULES BY MOUTH 3 TIMES DAILY (Patient taking differently: Take 400 mg by mouth 3 (three) times daily. Patient takes 2 tables 1 time a day) 540 capsule 3  . ALPRAZolam (XANAX) 0.5 MG tablet TAKE ONE-HALF TO ONE TABLET BY MOUTH THREE TIMES DAILY AS NEEDED FOR  NERVES 270 tablet 1  . atorvastatin (LIPITOR) 40 MG tablet Take 40 mg by mouth daily.    . carvedilol (COREG) 6.25 MG tablet TAKE 1 TABLET TWICE DAILY WITH A MEAL (Patient taking differently: Take 6.25 mg by mouth 2 (two) times daily with a meal. ) 180 tablet 1  . celecoxib (CELEBREX) 200 MG capsule Take 200 mg by mouth daily as needed for pain. And inflammation    . cholecalciferol (VITAMIN D) 1000 UNITS tablet Take 1 tablet (1,000 Units total) by mouth daily. 90 tablet 3  . cyanocobalamin 500 MCG tablet Take 1 tablet (500 mcg total) by mouth daily. 90 tablet 3  . dexamethasone (DECADRON) 4 MG tablet Take 1 tablet (4 mg total) by mouth daily. 1 tablet day before chemo and 1 tablet day after chemo 15 tablet 0  . fluconazole (DIFLUCAN) 100 MG tablet Take 1 tablet (100 mg total) by mouth daily. (Patient not taking: Reported on 11/15/2017) 7 tablet 0  . HYDROcodone-acetaminophen (NORCO) 10-325 MG tablet Take 1 tablet by mouth every 6 (six) hours as needed. 5 tablet 0  . HYDROcodone-acetaminophen (NORCO) 5-325 MG tablet Take 1 tablet by mouth every 4 (four) hours as needed. 20 tablet 0  . Insulin Pen Needle (RELION PEN NEEDLE 31G/8MM) 31G X 8 MM MISC 1 Device by Does not apply route 4 (four) times daily. 120 each 11  . letrozole (FEMARA) 2.5 MG tablet TAKE ONE TABLET BY MOUTH DAILY. 90 tablet 3  . lidocaine-prilocaine (EMLA) cream Apply to affected area once 30 g 3  . Liraglutide (VICTOZA) 18 MG/3ML SOPN Inject 3 mLs (18 mg total) into the skin every morning. (Patient taking differently: Inject 1.8 mg  into the skin every morning. ) 6 mL   . LORazepam (ATIVAN) 0.5 MG tablet Take 1 tablet (0.5 mg total) by mouth at bedtime as needed for sleep. 30 tablet 0  . losartan (COZAAR) 100 MG tablet Take 1 tablet (100 mg total) by mouth daily. 90 tablet 3  . magnesium oxide (MAG-OX) 400 MG tablet Take 1 tablet (400 mg total) by mouth daily. 30 tablet 9  . Multiple Vitamins-Minerals (ONE-A-DAY WOMENS 50+ ADVANTAGE) TABS Take 1 tablet by mouth daily. 90 tablet 3  . omeprazole (PRILOSEC) 40 MG capsule Take 40 mg by mouth daily.    . ondansetron (ZOFRAN) 8 MG tablet Take 1 tablet (8 mg total) by mouth 2 (two) times daily as needed for refractory nausea / vomiting. 30 tablet 1  . prochlorperazine (COMPAZINE) 10 MG tablet Take 1 tablet (10 mg total) by mouth every 6 (six) hours as needed (Nausea or vomiting). 30 tablet 1  . sertraline (ZOLOFT) 50 MG tablet Take 1 tablet (50 mg total) by mouth daily. 90 tablet 3  . spironolactone (ALDACTONE) 25  MG tablet TAKE 1 TABLET BY MOUTH EVERY DAY 30 tablet 6  . tolterodine (DETROL LA) 4 MG 24 hr capsule Take 1 capsule by mouth daily.     No current facility-administered medications for this visit.     PHYSICAL EXAMINATION: ECOG PERFORMANCE STATUS: 1 - Symptomatic but completely ambulatory  Vitals:   11/26/17 1117  BP: 100/64  Pulse: 82  Resp: 17  Temp: 97.7 F (36.5 C)  SpO2: 97%   Filed Weights   11/26/17 1117  Weight: 169 lb 6.4 oz (76.8 kg)    GENERAL:alert, no distress and comfortable SKIN: skin color, texture, turgor are normal, no rashes or significant lesions EYES: normal, Conjunctiva are pink and non-injected, sclera clear OROPHARYNX:no exudate, no erythema and lips, buccal mucosa, and tongue normal  NECK: supple, thyroid normal size, non-tender, without nodularity LYMPH:  no palpable lymphadenopathy in the cervical, axillary or inguinal LUNGS: clear to auscultation and percussion with normal breathing effort HEART: regular rate & rhythm and no  murmurs and no lower extremity edema ABDOMEN:abdomen soft, non-tender and normal bowel sounds MUSCULOSKELETAL:no cyanosis of digits and no clubbing  NEURO: alert & oriented x 3 with fluent speech, no focal motor/sensory deficits EXTREMITIES: No lower extremity edema   LABORATORY DATA:  I have reviewed the data as listed CMP Latest Ref Rng & Units 11/15/2017 04/29/2016 11/04/2015  Glucose 70 - 99 mg/dL 133(H) 93 153(H)  BUN 8 - 23 mg/dL 20 14.4 17  Creatinine 0.44 - 1.00 mg/dL 0.75 0.8 0.73  Sodium 135 - 145 mmol/L 141 142 142  Potassium 3.5 - 5.1 mmol/L 4.1 4.1 4.1  Chloride 98 - 111 mmol/L 106 - 104  CO2 22 - 32 mmol/L 24 31(H) 29  Calcium 8.9 - 10.3 mg/dL 9.9 10.3 10.0  Total Protein 6.4 - 8.3 g/dL - 7.0 -  Total Bilirubin 0.20 - 1.20 mg/dL - 0.53 -  Alkaline Phos 40 - 150 U/L - 127 -  AST 5 - 34 U/L - 15 -  ALT 0 - 55 U/L - 15 -    Lab Results  Component Value Date   WBC 7.2 11/15/2017   HGB 13.8 11/15/2017   HCT 43.6 11/15/2017   MCV 93.8 11/15/2017   PLT 224 11/15/2017   NEUTROABS 4.8 11/15/2017    ASSESSMENT & PLAN:  Primary cancer of lower-inner quadrant of left female breast ([place]) Left breast invasive lobular cancer diagnosed 02/20/2011 status post left mastectomy with axillary lymph node dissection, 6 cm ILC with LVI, PNI, 9/16 lymph nodes positive with extracapsular extension, ER 100%, PR 100%, HER-2 -1.31, Ki-67 18%, T3a N2 a M0 stage IIIa status post F AC chemotherapy 4 followed by radiation and currently on letrozole 2.5 mg daily since 10/16/2011  Relapse/recurrence: Elevated LFTs led to ultrasound of the liver which revealed liver metastases September 2019  PET CT scan 11/04/2017: Heavy burden of metastatic disease to the liver involving all lobes, largest 7.7 x 5.4 cm with SUV of 16.6, additional hypermetabolic left supraclavicular, left subpectoral, portacaval and aortocaval lymphadenopathy  Liver biopsy: 11/17/2017: Metastatic breast cancer ER 100%, PR 0%,  HER-2 3+ positive  Recommendation: 1.  Systemic chemotherapy  Taxotere Herceptin and Perjeta  We will hold off on Herceptin and Perjeta because of low cardiac ejection fraction.  She has appointment to see cardiology regarding this.  We decided to hold off on pursuing the clinical trial because the time it would take for her to get started on the trial is going to delay her  treatment. Return to clinic in 1 week for toxicity check.  No orders of the defined types were placed in this encounter.  The patient has a good understanding of the overall plan. she agrees with it. she will call with any problems that may develop before the next visit here.   Harriette Ohara, MD 11/26/17

## 2017-11-29 ENCOUNTER — Encounter: Payer: Self-pay | Admitting: Hematology and Oncology

## 2017-11-29 ENCOUNTER — Inpatient Hospital Stay: Payer: Medicare HMO

## 2017-11-29 ENCOUNTER — Other Ambulatory Visit: Payer: Self-pay

## 2017-11-29 VITALS — BP 123/72 | HR 90 | Temp 98.0°F | Resp 18

## 2017-11-29 DIAGNOSIS — C50312 Malignant neoplasm of lower-inner quadrant of left female breast: Secondary | ICD-10-CM

## 2017-11-29 DIAGNOSIS — I493 Ventricular premature depolarization: Secondary | ICD-10-CM

## 2017-11-29 DIAGNOSIS — Z853 Personal history of malignant neoplasm of breast: Secondary | ICD-10-CM

## 2017-11-29 DIAGNOSIS — I428 Other cardiomyopathies: Secondary | ICD-10-CM

## 2017-11-29 DIAGNOSIS — I420 Dilated cardiomyopathy: Secondary | ICD-10-CM

## 2017-11-29 DIAGNOSIS — Z5111 Encounter for antineoplastic chemotherapy: Secondary | ICD-10-CM | POA: Diagnosis not present

## 2017-11-29 MED ORDER — PEGFILGRASTIM-CBQV 6 MG/0.6ML ~~LOC~~ SOSY
6.0000 mg | PREFILLED_SYRINGE | Freq: Once | SUBCUTANEOUS | Status: AC
  Administered 2017-11-29: 6 mg via SUBCUTANEOUS

## 2017-11-29 MED ORDER — PEGFILGRASTIM-CBQV 6 MG/0.6ML ~~LOC~~ SOSY
PREFILLED_SYRINGE | SUBCUTANEOUS | Status: AC
  Filled 2017-11-29: qty 0.6

## 2017-11-29 NOTE — Patient Instructions (Signed)
Pegfilgrastim injection What is this medicine? PEGFILGRASTIM (PEG fil gra stim) is a long-acting granulocyte colony-stimulating factor that stimulates the growth of neutrophils, a type of white blood cell important in the body's fight against infection. It is used to reduce the incidence of fever and infection in patients with certain types of cancer who are receiving chemotherapy that affects the bone marrow, and to increase survival after being exposed to high doses of radiation. This medicine may be used for other purposes; ask your health care provider or pharmacist if you have questions. COMMON BRAND NAME(S): Neulasta What should I tell my health care provider before I take this medicine? They need to know if you have any of these conditions: -kidney disease -latex allergy -ongoing radiation therapy -sickle cell disease -skin reactions to acrylic adhesives (On-Body Injector only) -an unusual or allergic reaction to pegfilgrastim, filgrastim, other medicines, foods, dyes, or preservatives -pregnant or trying to get pregnant -breast-feeding How should I use this medicine? This medicine is for injection under the skin. If you get this medicine at home, you will be taught how to prepare and give the pre-filled syringe or how to use the On-body Injector. Refer to the patient Instructions for Use for detailed instructions. Use exactly as directed. Tell your healthcare provider immediately if you suspect that the On-body Injector may not have performed as intended or if you suspect the use of the On-body Injector resulted in a missed or partial dose. It is important that you put your used needles and syringes in a special sharps container. Do not put them in a trash can. If you do not have a sharps container, call your pharmacist or healthcare provider to get one. Talk to your pediatrician regarding the use of this medicine in children. While this drug may be prescribed for selected conditions,  precautions do apply. Overdosage: If you think you have taken too much of this medicine contact a poison control center or emergency room at once. NOTE: This medicine is only for you. Do not share this medicine with others. What if I miss a dose? It is important not to miss your dose. Call your doctor or health care professional if you miss your dose. If you miss a dose due to an On-body Injector failure or leakage, a new dose should be administered as soon as possible using a single prefilled syringe for manual use. What may interact with this medicine? Interactions have not been studied. Give your health care provider a list of all the medicines, herbs, non-prescription drugs, or dietary supplements you use. Also tell them if you smoke, drink alcohol, or use illegal drugs. Some items may interact with your medicine. This list may not describe all possible interactions. Give your health care provider a list of all the medicines, herbs, non-prescription drugs, or dietary supplements you use. Also tell them if you smoke, drink alcohol, or use illegal drugs. Some items may interact with your medicine. What should I watch for while using this medicine? You may need blood work done while you are taking this medicine. If you are going to need a MRI, CT scan, or other procedure, tell your doctor that you are using this medicine (On-Body Injector only). What side effects may I notice from receiving this medicine? Side effects that you should report to your doctor or health care professional as soon as possible: -allergic reactions like skin rash, itching or hives, swelling of the face, lips, or tongue -dizziness -fever -pain, redness, or irritation at site   where injected -pinpoint red spots on the skin -red or dark-brown urine -shortness of breath or breathing problems -stomach or side pain, or pain at the shoulder -swelling -tiredness -trouble passing urine or change in the amount of urine Side  effects that usually do not require medical attention (report to your doctor or health care professional if they continue or are bothersome): -bone pain -muscle pain This list may not describe all possible side effects. Call your doctor for medical advice about side effects. You may report side effects to FDA at 1-800-FDA-1088. Where should I keep my medicine? Keep out of the reach of children. Store pre-filled syringes in a refrigerator between 2 and 8 degrees C (36 and 46 degrees F). Do not freeze. Keep in carton to protect from light. Throw away this medicine if it is left out of the refrigerator for more than 48 hours. Throw away any unused medicine after the expiration date. NOTE: This sheet is a summary. It may not cover all possible information. If you have questions about this medicine, talk to your doctor, pharmacist, or health care provider.  2018 [employer]vier/Gold Standard (2016-01-16 12:58:03)  

## 2017-11-30 ENCOUNTER — Encounter: Payer: Self-pay | Admitting: Hematology and Oncology

## 2017-12-01 ENCOUNTER — Telehealth: Payer: Self-pay | Admitting: Hematology and Oncology

## 2017-12-01 ENCOUNTER — Inpatient Hospital Stay (HOSPITAL_BASED_OUTPATIENT_CLINIC_OR_DEPARTMENT_OTHER): Payer: Medicare HMO | Admitting: Hematology and Oncology

## 2017-12-01 ENCOUNTER — Inpatient Hospital Stay: Payer: Medicare HMO

## 2017-12-01 DIAGNOSIS — C50312 Malignant neoplasm of lower-inner quadrant of left female breast: Secondary | ICD-10-CM

## 2017-12-01 DIAGNOSIS — C787 Secondary malignant neoplasm of liver and intrahepatic bile duct: Secondary | ICD-10-CM | POA: Diagnosis not present

## 2017-12-01 DIAGNOSIS — Z923 Personal history of irradiation: Secondary | ICD-10-CM

## 2017-12-01 DIAGNOSIS — Z95828 Presence of other vascular implants and grafts: Secondary | ICD-10-CM | POA: Insufficient documentation

## 2017-12-01 DIAGNOSIS — R110 Nausea: Secondary | ICD-10-CM

## 2017-12-01 DIAGNOSIS — Z79811 Long term (current) use of aromatase inhibitors: Secondary | ICD-10-CM

## 2017-12-01 DIAGNOSIS — Z79899 Other long term (current) drug therapy: Secondary | ICD-10-CM

## 2017-12-01 DIAGNOSIS — B370 Candidal stomatitis: Secondary | ICD-10-CM | POA: Diagnosis not present

## 2017-12-01 DIAGNOSIS — Z9012 Acquired absence of left breast and nipple: Secondary | ICD-10-CM

## 2017-12-01 DIAGNOSIS — M8580 Other specified disorders of bone density and structure, unspecified site: Secondary | ICD-10-CM

## 2017-12-01 DIAGNOSIS — Z170 Estrogen receptor positive status [ER+]: Secondary | ICD-10-CM | POA: Diagnosis not present

## 2017-12-01 DIAGNOSIS — R5383 Other fatigue: Secondary | ICD-10-CM

## 2017-12-01 LAB — CBC WITH DIFFERENTIAL (CANCER CENTER ONLY)
ABS IMMATURE GRANULOCYTES: 0.03 10*3/uL (ref 0.00–0.07)
Basophils Absolute: 0.1 10*3/uL (ref 0.0–0.1)
Basophils Relative: 2 %
EOS ABS: 0.1 10*3/uL (ref 0.0–0.5)
EOS PCT: 5 %
HCT: 40.7 % (ref 36.0–46.0)
Hemoglobin: 13.6 g/dL (ref 12.0–15.0)
Immature Granulocytes: 1 %
LYMPHS ABS: 0.9 10*3/uL (ref 0.7–4.0)
Lymphocytes Relative: 36 %
MCH: 30.2 pg (ref 26.0–34.0)
MCHC: 33.4 g/dL (ref 30.0–36.0)
MCV: 90.4 fL (ref 80.0–100.0)
Monocytes Absolute: 0.1 10*3/uL (ref 0.1–1.0)
Monocytes Relative: 4 %
NRBC: 0 % (ref 0.0–0.2)
Neutro Abs: 1.3 10*3/uL — ABNORMAL LOW (ref 1.7–7.7)
Neutrophils Relative %: 52 %
PLATELETS: 187 10*3/uL (ref 150–400)
RBC: 4.5 MIL/uL (ref 3.87–5.11)
RDW: 13.3 % (ref 11.5–15.5)
WBC Count: 2.6 10*3/uL — ABNORMAL LOW (ref 4.0–10.5)

## 2017-12-01 LAB — CMP (CANCER CENTER ONLY)
ALK PHOS: 122 U/L (ref 38–126)
ALT: 61 U/L — AB (ref 0–44)
AST: 61 U/L — ABNORMAL HIGH (ref 15–41)
Albumin: 2.9 g/dL — ABNORMAL LOW (ref 3.5–5.0)
Anion gap: 9 (ref 5–15)
BUN: 22 mg/dL (ref 8–23)
CALCIUM: 9.8 mg/dL (ref 8.9–10.3)
CHLORIDE: 103 mmol/L (ref 98–111)
CO2: 27 mmol/L (ref 22–32)
CREATININE: 0.84 mg/dL (ref 0.44–1.00)
Glucose, Bld: 122 mg/dL — ABNORMAL HIGH (ref 70–99)
Potassium: 4.3 mmol/L (ref 3.5–5.1)
Sodium: 139 mmol/L (ref 135–145)
Total Bilirubin: 1.1 mg/dL (ref 0.3–1.2)
Total Protein: 6.2 g/dL — ABNORMAL LOW (ref 6.5–8.1)

## 2017-12-01 MED ORDER — HEPARIN SOD (PORK) LOCK FLUSH 100 UNIT/ML IV SOLN
500.0000 [IU] | Freq: Once | INTRAVENOUS | Status: AC
  Administered 2017-12-01: 500 [IU]
  Filled 2017-12-01: qty 5

## 2017-12-01 MED ORDER — NYSTATIN 100000 UNIT/ML MT SUSP: 5.0000 mL | Freq: Four times a day (QID) | OROMUCOSAL | 0 refills | Status: DC

## 2017-12-01 MED ORDER — FLUCONAZOLE 100 MG PO TABS: 100.0000 mg | ORAL_TABLET | Freq: Every day | ORAL | 0 refills | Status: DC

## 2017-12-01 MED ORDER — SODIUM CHLORIDE 0.9% FLUSH
10.0000 mL | Freq: Once | INTRAVENOUS | Status: AC
  Administered 2017-12-01: 10 mL
  Filled 2017-12-01: qty 10

## 2017-12-01 NOTE — Progress Notes (Signed)
Patient Care Team: Vicenta Aly, FNP as PCP - General (Nurse Practitioner) Neldon Mc, MD as Surgeon (General Surgery) Sueanne Margarita, MD as Consulting Physician (Cardiology)  DIAGNOSIS:  Encounter Diagnosis  Name Primary?  . 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 Her clinical staging was stage IIB (T3, N0, M0).      03/23/2011 Surgery    L mastectomy + Axillary LND 6 cm ILC with LVI, PNI; 9/16 LN positive with Extracapsular ext ER 100%; PR 100%; Her 2 1.31; Ki 67: 18; pT3a N2a (stage IIIA)    04/17/2011 - 06/19/2011 Chemotherapy    FEC X 4 complicated by febrile neutropenis, spesis and chest wall abscess    10/07/2011 - 11/24/2011 Radiation Therapy    Left chest wall and axilla 5040 cGy in 28 # with Boost    10/16/2011 -  Anti-estrogen oral therapy    Letrozole 2.5 mg po daily    05/27/2015 - 05/30/2015 Hospital Admission    Acute systolic CHF, dilated cardiomyopathy, EF 35-40%    08/09/2015 Surgery    Right lumpectomy: Fibrocystic changes with calcifications, right nipple lesion: Seborrheic keratosis no evidence of malignancy    10/20/2017 Imaging    Elevated LFTs letter ultrasound of the liver which showed liver metastases    11/04/2017 PET scan    Heavy burden of metastatic disease to the liver involving all lobes, index 7.7 centimeters right hepatic lobe mass SUV 16.6, small but hypermetabolic left supraclavicular, left subpectoral, portacaval, aortocaval adenopathy    11/12/2017 Pathology Results    Liver biopsy: Metastatic breast cancer, ER 100%, PR 0%, Ki-67 10%, HER-2 3+ positive    11/19/2017 -  Chemotherapy    The patient had pegfilgrastim-cbqv (UDENYCA) injection 6 mg, 6 mg,  Subcutaneous, Once, 1 of 6 cycles Administration: 6 mg (11/29/2017) trastuzumab (HERCEPTIN) 609 mg in sodium chloride 0.9 % 250 mL chemo infusion, 8 mg/kg = 609 mg, Intravenous,  Once, 1 of 6 cycles DOCEtaxel (TAXOTERE) 140 mg in sodium chloride 0.9 % 250 mL chemo infusion, 75 mg/m2 = 140 mg, Intravenous,  Once, 1 of 6 cycles Administration: 140 mg (11/26/2017) pertuzumab (PERJETA) 840 mg in sodium chloride 0.9 % 250 mL chemo infusion, 840 mg, Intravenous, Once, 1 of 6 cycles  for chemotherapy treatment.      CHIEF COMPLIANT: Cycle 1 day 8 Taxotere  INTERVAL HISTORY: [name] is a 77 year old with above-mentioned history of metastatic breast cancer who started chemotherapy last week with the Taxotere.  Herceptin and Perjeta are on hold because of low ejection fraction.  She tolerated Taxotere to moderately well.  She did have mild nausea but she did not take any nausea medications.  She has not been eating much food and has thrush in the mouth and sinus drainage.  Denies any diarrhea.  She does complain of constipation.  REVIEW OF SYSTEMS:   Constitutional: Fatigue from chemotherapy today appears to be much better Eyes: Denies blurriness of vision Ears, nose, mouth, throat, and face: Denies mucositis or sore throat Respiratory: Denies cough, dyspnea or wheezes Cardiovascular: Denies palpitation, chest discomfort Gastrointestinal: Mild nausea and constipation Skin: Denies abnormal skin rashes Lymphatics: Denies new lymphadenopathy or easy bruising Neurological:Denies numbness, tingling or new weaknesses Behavioral/Psych: Mood is stable, no  new changes  Extremities: No lower extremity edema All other systems were reviewed with the patient and are negative.  I have reviewed the past medical history, past surgical history, social history and family history with the patient and they are unchanged from previous note.  ALLERGIES:  is allergic to cephalexin; clindamycin;  oxycodone-acetaminophen; rocephin [ceftriaxone sodium in dextrose]; sulfonamide derivatives; and dilaudid [hydromorphone hcl].  MEDICATIONS:  Current Outpatient Medications  Medication Sig Dispense Refill  . acyclovir (ZOVIRAX) 200 MG capsule TAKE 2 CAPSULES BY MOUTH 3 TIMES DAILY (Patient taking differently: Take 400 mg by mouth 3 (three) times daily. Patient takes 2 tables 1 time a day) 540 capsule 3  . ALPRAZolam (XANAX) 0.5 MG tablet TAKE ONE-HALF TO ONE TABLET BY MOUTH THREE TIMES DAILY AS NEEDED FOR  NERVES 270 tablet 1  . atorvastatin (LIPITOR) 40 MG tablet Take 40 mg by mouth daily.    . carvedilol (COREG) 6.25 MG tablet TAKE 1 TABLET TWICE DAILY WITH A MEAL (Patient taking differently: Take 6.25 mg by mouth 2 (two) times daily with a meal. ) 180 tablet 1  . celecoxib (CELEBREX) 200 MG capsule Take 200 mg by mouth daily as needed for pain. And inflammation    . cholecalciferol (VITAMIN D) 1000 UNITS tablet Take 1 tablet (1,000 Units total) by mouth daily. 90 tablet 3  . cyanocobalamin 500 MCG tablet Take 1 tablet (500 mcg total) by mouth daily. 90 tablet 3  . dexamethasone (DECADRON) 4 MG tablet Take 1 tablet (4 mg total) by mouth daily. 1 tablet day before chemo and 1 tablet day after chemo 15 tablet 0  . fluconazole (DIFLUCAN) 100 MG tablet Take 1 tablet (100 mg total) by mouth daily. 7 tablet 0  . HYDROcodone-acetaminophen (NORCO) 10-325 MG tablet Take 1 tablet by mouth every 6 (six) hours as needed. 5 tablet 0  . HYDROcodone-acetaminophen (NORCO) 5-325 MG tablet Take 1 tablet by mouth every 4 (four) hours as needed. 20 tablet 0  . Insulin Pen Needle (RELION PEN NEEDLE 31G/8MM) 31G X 8 MM MISC 1 Device by Does not apply route 4 (four) times daily. 120 each 11  . letrozole (FEMARA) 2.5 MG tablet TAKE ONE TABLET BY MOUTH DAILY. 90 tablet 3  . lidocaine-prilocaine (EMLA) cream Apply to affected area once 30 g 3  . Liraglutide (VICTOZA) 18 MG/3ML SOPN Inject 3 mLs (18 mg total) into the  skin every morning. (Patient taking differently: Inject 1.8 mg into the skin every morning. ) 6 mL   . LORazepam (ATIVAN) 0.5 MG tablet Take 1 tablet (0.5 mg total) by mouth at bedtime as needed for sleep. 30 tablet 0  . losartan (COZAAR) 100 MG tablet Take 1 tablet (100 mg total) by mouth daily. 90 tablet 3  . magnesium oxide (MAG-OX) 400 MG tablet Take 1 tablet (400 mg total) by mouth daily. 30 tablet 9  . Multiple Vitamins-Minerals (ONE-A-DAY WOMENS 50+ ADVANTAGE) TABS Take 1 tablet by mouth daily. 90 tablet 3  . omeprazole (PRILOSEC) 40 MG capsule Take 40 mg by mouth daily.    . ondansetron (ZOFRAN) 8 MG tablet Take 1 tablet (8 mg total) by mouth 2 (two) times daily as needed for refractory nausea / vomiting. 30 tablet 1  . prochlorperazine (COMPAZINE) 10 MG tablet Take 1 tablet (10 mg total) by mouth every 6 (six) hours as needed (Nausea or vomiting). 30 tablet 1  . sertraline (ZOLOFT) 50 MG tablet Take 1 tablet (50 mg total) by mouth daily. 90 tablet  3  . spironolactone (ALDACTONE) 25 MG tablet TAKE 1 TABLET BY MOUTH EVERY DAY 30 tablet 6  . tolterodine (DETROL LA) 4 MG 24 hr capsule Take 1 capsule by mouth daily.     No current facility-administered medications for this visit.     PHYSICAL EXAMINATION: ECOG PERFORMANCE STATUS: 1 - Symptomatic but completely ambulatory  Vitals:   12/01/17 1023  BP: 117/63  Pulse: 97  Resp: 17  Temp: 98.4 F (36.9 C)  SpO2: 96%   Filed Weights   12/01/17 1023  Weight: 165 lb 9.6 oz (75.1 kg)    GENERAL:alert, no distress and comfortable SKIN: skin color, texture, turgor are normal, no rashes or significant lesions EYES: normal, Conjunctiva are pink and non-injected, sclera clear OROPHARYNX:no exudate, no erythema and lips, buccal mucosa, and tongue normal  NECK: supple, thyroid normal size, non-tender, without nodularity LYMPH:  no palpable lymphadenopathy in the cervical, axillary or inguinal LUNGS: clear to auscultation and percussion  with normal breathing effort HEART: regular rate & rhythm and no murmurs and no lower extremity edema ABDOMEN:abdomen soft, non-tender and normal bowel sounds MUSCULOSKELETAL:no cyanosis of digits and no clubbing  NEURO: alert & oriented x 3 with fluent speech, no focal motor/sensory deficits EXTREMITIES: No lower extremity edema   LABORATORY DATA:  I have reviewed the data as listed CMP Latest Ref Rng & Units 12/01/2017 11/26/2017 11/15/2017  Glucose 70 - 99 mg/dL 122(H) 207(H) 133(H)  BUN 8 - 23 mg/dL '22 13 20  '$ Creatinine 0.44 - 1.00 mg/dL 0.84 0.84 0.75  Sodium 135 - 145 mmol/L 139 141 141  Potassium 3.5 - 5.1 mmol/L 4.3 4.7 4.1  Chloride 98 - 111 mmol/L 103 102 106  CO2 22 - 32 mmol/L '27 28 24  '$ Calcium 8.9 - 10.3 mg/dL 9.8 10.6(H) 9.9  Total Protein 6.5 - 8.1 g/dL 6.2(L) 6.7 -  Total Bilirubin 0.3 - 1.2 mg/dL 1.1 0.4 -  Alkaline Phos 38 - 126 U/L 122 148(H) -  AST 15 - 41 U/L 61(H) 83(H) -  ALT 0 - 44 U/L 61(H) 93(H) -    Lab Results  Component Value Date   WBC 2.6 (L) 12/01/2017   HGB 13.6 12/01/2017   HCT 40.7 12/01/2017   MCV 90.4 12/01/2017   PLT 187 12/01/2017   NEUTROABS 1.3 (L) 12/01/2017    ASSESSMENT & PLAN:  Primary cancer of lower-inner quadrant of left female breast ([place]) Left breast invasive lobular cancer diagnosed 02/20/2011 status post left mastectomy with axillary lymph node dissection, 6 cm ILC with LVI, PNI, 9/16 lymph nodes positive with extracapsular extension, ER 100%, PR 100%, HER-2 -1.31, Ki-67 18%, T3a N2 a M0 stage IIIa status post F AC chemotherapy 4 followed by radiation and currently on letrozole 2.5 mg daily since 10/16/2011  Relapse/recurrence: Elevated LFTs led to ultrasound of the liver which revealed liver metastases September 2019  PET CT scan 11/04/2017: Heavy burden of metastatic disease to the liver involving all lobes, largest 7.7 x 5.4 cm with SUV of 16.6, additional hypermetabolic left supraclavicular, left subpectoral,  portacaval and aortocaval lymphadenopathy  Liver biopsy: 11/17/2017: Metastatic breast cancer ER 100%, PR 0%, HER-2 3+ positive  Current treatment: .Systemic chemotherapy Taxotere Herceptin and Perjeta (held Herceptin and Perjeta because of low cardiac ejection fraction.)  She has appointment to see cardiology regarding this.  Chemo toxicities: 1.  Fatigue 2. thrush: Sent prescription for Diflucan 3.  Nausea: Mild  I sent a message to cardiology to see her soon for evaluation.  Return to clinic in 2 weeks for cycle 2    No orders of the defined types were placed in this encounter.  The patient has a good understanding of the overall plan. she agrees with it. she will call with any problems that may develop before the next visit here.   Harriette Ohara, MD 12/01/17

## 2017-12-01 NOTE — Telephone Encounter (Signed)
No 10/30 los orders.

## 2017-12-01 NOTE — Assessment & Plan Note (Signed)
Left breast invasive lobular cancer diagnosed 02/20/2011 status post left mastectomy with axillary lymph node dissection, 6 cm ILC with LVI, PNI, 9/16 lymph nodes positive with extracapsular extension, ER 100%, PR 100%, HER-2 -1.31, Ki-67 18%, T3a N2 a M0 stage IIIa status post F AC chemotherapy 4 followed by radiation and currently on letrozole 2.5 mg daily since 10/16/2011  Relapse/recurrence: Elevated LFTs led to ultrasound of the liver which revealed liver metastases September 2019  PET CT scan 11/04/2017: Heavy burden of metastatic disease to the liver involving all lobes, largest 7.7 x 5.4 cm with SUV of 16.6, additional hypermetabolic left supraclavicular, left subpectoral, portacaval and aortocaval lymphadenopathy  Liver biopsy: 11/17/2017: Metastatic breast cancer ER 100%, PR 0%, HER-2 3+ positive  Current treatment: .Systemic chemotherapy Taxotere Herceptin and Perjeta (held Herceptin and Perjeta because of low cardiac ejection fraction.)  She has appointment to see cardiology regarding this.  Chemo toxicities:  Return to clinic in 2 weeks for cycle 2

## 2017-12-02 ENCOUNTER — Other Ambulatory Visit: Payer: Medicare HMO

## 2017-12-02 ENCOUNTER — Ambulatory Visit: Payer: Medicare HMO

## 2017-12-03 ENCOUNTER — Inpatient Hospital Stay (HOSPITAL_BASED_OUTPATIENT_CLINIC_OR_DEPARTMENT_OTHER): Payer: Medicare HMO | Admitting: Medical

## 2017-12-03 ENCOUNTER — Other Ambulatory Visit: Payer: Medicare HMO

## 2017-12-03 ENCOUNTER — Inpatient Hospital Stay: Payer: Medicare HMO | Attending: Hematology and Oncology | Admitting: Medical

## 2017-12-03 ENCOUNTER — Other Ambulatory Visit: Payer: Self-pay

## 2017-12-03 ENCOUNTER — Inpatient Hospital Stay: Payer: Medicare HMO

## 2017-12-03 ENCOUNTER — Inpatient Hospital Stay (HOSPITAL_COMMUNITY): Payer: Medicare HMO

## 2017-12-03 ENCOUNTER — Encounter (HOSPITAL_COMMUNITY): Payer: Self-pay | Admitting: *Deleted

## 2017-12-03 ENCOUNTER — Encounter: Payer: Self-pay | Admitting: Hematology and Oncology

## 2017-12-03 ENCOUNTER — Inpatient Hospital Stay (HOSPITAL_COMMUNITY)
Admission: AD | Admit: 2017-12-03 | Discharge: 2017-12-06 | DRG: 809 | Disposition: A | Discharge status: Home Health | Payer: Medicare HMO | Source: Ambulatory Visit | Attending: Internal Medicine | Admitting: Internal Medicine

## 2017-12-03 ENCOUNTER — Telehealth: Payer: Self-pay | Admitting: *Deleted

## 2017-12-03 VITALS — BP 112/86 | HR 94 | Temp 97.9°F | Resp 17 | Ht 68.0 in | Wt 167.5 lb

## 2017-12-03 DIAGNOSIS — D709 Neutropenia, unspecified: Secondary | ICD-10-CM | POA: Diagnosis present

## 2017-12-03 DIAGNOSIS — Z9221 Personal history of antineoplastic chemotherapy: Secondary | ICD-10-CM | POA: Diagnosis not present

## 2017-12-03 DIAGNOSIS — Z170 Estrogen receptor positive status [ER+]: Secondary | ICD-10-CM | POA: Diagnosis not present

## 2017-12-03 DIAGNOSIS — I491 Atrial premature depolarization: Secondary | ICD-10-CM | POA: Diagnosis present

## 2017-12-03 DIAGNOSIS — C787 Secondary malignant neoplasm of liver and intrahepatic bile duct: Secondary | ICD-10-CM | POA: Insufficient documentation

## 2017-12-03 DIAGNOSIS — Z95828 Presence of other vascular implants and grafts: Secondary | ICD-10-CM

## 2017-12-03 DIAGNOSIS — C50919 Malignant neoplasm of unspecified site of unspecified female breast: Secondary | ICD-10-CM

## 2017-12-03 DIAGNOSIS — T451X5A Adverse effect of antineoplastic and immunosuppressive drugs, initial encounter: Secondary | ICD-10-CM | POA: Diagnosis present

## 2017-12-03 DIAGNOSIS — Z9012 Acquired absence of left breast and nipple: Secondary | ICD-10-CM | POA: Insufficient documentation

## 2017-12-03 DIAGNOSIS — B373 Candidiasis of vulva and vagina: Secondary | ICD-10-CM | POA: Diagnosis present

## 2017-12-03 DIAGNOSIS — Z9049 Acquired absence of other specified parts of digestive tract: Secondary | ICD-10-CM | POA: Diagnosis not present

## 2017-12-03 DIAGNOSIS — Z79899 Other long term (current) drug therapy: Secondary | ICD-10-CM

## 2017-12-03 DIAGNOSIS — Z90710 Acquired absence of both cervix and uterus: Secondary | ICD-10-CM | POA: Diagnosis not present

## 2017-12-03 DIAGNOSIS — K1230 Oral mucositis (ulcerative), unspecified: Secondary | ICD-10-CM | POA: Diagnosis present

## 2017-12-03 DIAGNOSIS — M8580 Other specified disorders of bone density and structure, unspecified site: Secondary | ICD-10-CM | POA: Diagnosis present

## 2017-12-03 DIAGNOSIS — D701 Agranulocytosis secondary to cancer chemotherapy: Secondary | ICD-10-CM | POA: Diagnosis present

## 2017-12-03 DIAGNOSIS — Z882 Allergy status to sulfonamides status: Secondary | ICD-10-CM | POA: Diagnosis not present

## 2017-12-03 DIAGNOSIS — Z923 Personal history of irradiation: Secondary | ICD-10-CM | POA: Diagnosis not present

## 2017-12-03 DIAGNOSIS — I110 Hypertensive heart disease with heart failure: Secondary | ICD-10-CM | POA: Diagnosis present

## 2017-12-03 DIAGNOSIS — I951 Orthostatic hypotension: Secondary | ICD-10-CM | POA: Diagnosis present

## 2017-12-03 DIAGNOSIS — E860 Dehydration: Secondary | ICD-10-CM | POA: Diagnosis present

## 2017-12-03 DIAGNOSIS — C50312 Malignant neoplasm of lower-inner quadrant of left female breast: Secondary | ICD-10-CM

## 2017-12-03 DIAGNOSIS — E119 Type 2 diabetes mellitus without complications: Secondary | ICD-10-CM | POA: Diagnosis present

## 2017-12-03 DIAGNOSIS — K1231 Oral mucositis (ulcerative) due to antineoplastic therapy: Secondary | ICD-10-CM | POA: Diagnosis not present

## 2017-12-03 DIAGNOSIS — R509 Fever, unspecified: Secondary | ICD-10-CM

## 2017-12-03 DIAGNOSIS — Z23 Encounter for immunization: Secondary | ICD-10-CM | POA: Insufficient documentation

## 2017-12-03 DIAGNOSIS — R5081 Fever presenting with conditions classified elsewhere: Secondary | ICD-10-CM | POA: Insufficient documentation

## 2017-12-03 DIAGNOSIS — I5022 Chronic systolic (congestive) heart failure: Secondary | ICD-10-CM | POA: Diagnosis not present

## 2017-12-03 DIAGNOSIS — Z79811 Long term (current) use of aromatase inhibitors: Secondary | ICD-10-CM | POA: Insufficient documentation

## 2017-12-03 DIAGNOSIS — Z881 Allergy status to other antibiotic agents status: Secondary | ICD-10-CM

## 2017-12-03 DIAGNOSIS — Z853 Personal history of malignant neoplasm of breast: Secondary | ICD-10-CM

## 2017-12-03 DIAGNOSIS — E785 Hyperlipidemia, unspecified: Secondary | ICD-10-CM | POA: Diagnosis present

## 2017-12-03 DIAGNOSIS — Z885 Allergy status to narcotic agent status: Secondary | ICD-10-CM | POA: Diagnosis not present

## 2017-12-03 DIAGNOSIS — I420 Dilated cardiomyopathy: Secondary | ICD-10-CM | POA: Diagnosis present

## 2017-12-03 DIAGNOSIS — I5042 Chronic combined systolic (congestive) and diastolic (congestive) heart failure: Secondary | ICD-10-CM | POA: Diagnosis present

## 2017-12-03 DIAGNOSIS — Z8261 Family history of arthritis: Secondary | ICD-10-CM

## 2017-12-03 DIAGNOSIS — K219 Gastro-esophageal reflux disease without esophagitis: Secondary | ICD-10-CM | POA: Diagnosis present

## 2017-12-03 DIAGNOSIS — R000 Tachycardia, unspecified: Secondary | ICD-10-CM | POA: Insufficient documentation

## 2017-12-03 DIAGNOSIS — I493 Ventricular premature depolarization: Secondary | ICD-10-CM | POA: Diagnosis present

## 2017-12-03 DIAGNOSIS — Z833 Family history of diabetes mellitus: Secondary | ICD-10-CM

## 2017-12-03 DIAGNOSIS — Z825 Family history of asthma and other chronic lower respiratory diseases: Secondary | ICD-10-CM

## 2017-12-03 DIAGNOSIS — K449 Diaphragmatic hernia without obstruction or gangrene: Secondary | ICD-10-CM | POA: Diagnosis present

## 2017-12-03 DIAGNOSIS — M1990 Unspecified osteoarthritis, unspecified site: Secondary | ICD-10-CM | POA: Diagnosis present

## 2017-12-03 DIAGNOSIS — Z5111 Encounter for antineoplastic chemotherapy: Secondary | ICD-10-CM | POA: Insufficient documentation

## 2017-12-03 LAB — CMP (CANCER CENTER ONLY)
ALT: 49 U/L — AB (ref 0–44)
AST: 47 U/L — ABNORMAL HIGH (ref 15–41)
Albumin: 2.8 g/dL — ABNORMAL LOW (ref 3.5–5.0)
Alkaline Phosphatase: 126 U/L (ref 38–126)
Anion gap: 10 (ref 5–15)
BUN: 14 mg/dL (ref 8–23)
CHLORIDE: 100 mmol/L (ref 98–111)
CO2: 26 mmol/L (ref 22–32)
CREATININE: 0.95 mg/dL (ref 0.44–1.00)
Calcium: 9.9 mg/dL (ref 8.9–10.3)
GFR, EST NON AFRICAN AMERICAN: 56 mL/min — AB (ref >60)
GFR, Est AFR Am: >60 mL/min (ref >60)
GLUCOSE: 103 mg/dL — AB (ref 70–99)
POTASSIUM: 4.6 mmol/L (ref 3.5–5.1)
Sodium: 136 mmol/L (ref 135–145)
Total Bilirubin: 0.9 mg/dL (ref 0.3–1.2)
Total Protein: 6.2 g/dL — ABNORMAL LOW (ref 6.5–8.1)

## 2017-12-03 LAB — CBC WITH DIFFERENTIAL (CANCER CENTER ONLY)
BAND NEUTROPHILS: 3 %
Basophils Absolute: 0.1 10*3/uL (ref 0.0–0.1)
Basophils Relative: 3 %
Blasts: 2 %
Eosinophils Absolute: 0.1 10*3/uL (ref 0.0–0.5)
Eosinophils Relative: 4 %
HEMATOCRIT: 40.9 % (ref 36.0–46.0)
HEMOGLOBIN: 13.5 g/dL (ref 12.0–15.0)
Lymphocytes Relative: 45 %
Lymphs Abs: 1.5 10*3/uL (ref 0.7–4.0)
MCH: 30.1 pg (ref 26.0–34.0)
MCHC: 33 g/dL (ref 30.0–36.0)
MCV: 91.1 fL (ref 80.0–100.0)
METAMYELOCYTES PCT: 6 %
MONO ABS: 0.7 10*3/uL (ref 0.1–1.0)
MONOS PCT: 22 %
Myelocytes: 9 %
NEUTROS ABS: 0.8 10*3/uL — AB (ref 1.7–17.7)
Neutrophils Relative %: 6 %
Platelet Count: 217 10*3/uL (ref 150–400)
RBC: 4.49 MIL/uL (ref 3.87–5.11)
RDW: 13.2 % (ref 11.5–15.5)
WBC Count: 3.4 10*3/uL — ABNORMAL LOW (ref 4.0–10.5)
nRBC: 1.2 % — ABNORMAL HIGH (ref 0.0–0.2)

## 2017-12-03 LAB — GLUCOSE, CAPILLARY: Glucose-Capillary: 106 mg/dL — ABNORMAL HIGH (ref 70–99)

## 2017-12-03 LAB — URINALYSIS, COMPLETE (UACMP) WITH MICROSCOPIC
Bilirubin Urine: NEGATIVE
Glucose, UA: NEGATIVE mg/dL
HGB URINE DIPSTICK: NEGATIVE
Ketones, ur: NEGATIVE mg/dL
Leukocytes, UA: NEGATIVE
Nitrite: NEGATIVE
Protein, ur: NEGATIVE mg/dL
SPECIFIC GRAVITY, URINE: 1.011 (ref 1.005–1.030)
pH: 6 (ref 5.0–8.0)

## 2017-12-03 MED ORDER — ENSURE ENLIVE PO LIQD
237.0000 mL | Freq: Two times a day (BID) | ORAL | Status: DC
  Administered 2017-12-04: 237 mL via ORAL

## 2017-12-03 MED ORDER — LEVOFLOXACIN IN D5W 500 MG/100ML IV SOLN
500.0000 mg | Freq: Once | INTRAVENOUS | Status: AC
  Administered 2017-12-03: 500 mg via INTRAVENOUS
  Filled 2017-12-03: qty 100

## 2017-12-03 MED ORDER — VITAMIN D3 25 MCG (1000 UNIT) PO TABS
1000.0000 [IU] | ORAL_TABLET | Freq: Every day | ORAL | Status: DC
  Administered 2017-12-03 – 2017-12-06 (×4): 1000 [IU] via ORAL
  Filled 2017-12-03 (×4): qty 1

## 2017-12-03 MED ORDER — SODIUM CHLORIDE 0.9 % IV SOLN
INTRAVENOUS | Status: AC
  Administered 2017-12-04: 01:00:00 via INTRAVENOUS

## 2017-12-03 MED ORDER — ONDANSETRON HCL 8 MG PO TABS: 8.0000 mg | ORAL_TABLET | Freq: Two times a day (BID) | ORAL | Status: DC | PRN

## 2017-12-03 MED ORDER — MAGIC MOUTHWASH W/LIDOCAINE
10.0000 mL | Freq: Three times a day (TID) | ORAL | Status: DC
  Administered 2017-12-03 – 2017-12-06 (×6): 10 mL via ORAL
  Filled 2017-12-03 (×10): qty 10

## 2017-12-03 MED ORDER — SODIUM CHLORIDE 0.9 % IV SOLN
1.0000 g | Freq: Three times a day (TID) | INTRAVENOUS | Status: DC
  Administered 2017-12-03 – 2017-12-05 (×5): 1 g via INTRAVENOUS
  Filled 2017-12-03 (×6): qty 1

## 2017-12-03 MED ORDER — INSULIN ASPART 100 UNIT/ML ~~LOC~~ SOLN
0.0000 [IU] | Freq: Three times a day (TID) | SUBCUTANEOUS | Status: DC
  Administered 2017-12-04: 3 [IU] via SUBCUTANEOUS
  Administered 2017-12-04: 1 [IU] via SUBCUTANEOUS
  Administered 2017-12-05 (×2): 2 [IU] via SUBCUTANEOUS
  Administered 2017-12-06: 3 [IU] via SUBCUTANEOUS

## 2017-12-03 MED ORDER — PANTOPRAZOLE SODIUM 40 MG PO TBEC
40.0000 mg | DELAYED_RELEASE_TABLET | Freq: Every day | ORAL | Status: DC
  Administered 2017-12-03 – 2017-12-06 (×4): 40 mg via ORAL
  Filled 2017-12-03 (×4): qty 1

## 2017-12-03 MED ORDER — MAGNESIUM OXIDE 400 MG PO TABS: 400.0000 mg | ORAL_TABLET | Freq: Every day | ORAL | Status: DC

## 2017-12-03 MED ORDER — NYSTATIN 100000 UNIT/GM EX CREA
TOPICAL_CREAM | Freq: Two times a day (BID) | CUTANEOUS | Status: DC
  Administered 2017-12-03 – 2017-12-05 (×4): via TOPICAL
  Filled 2017-12-03: qty 15

## 2017-12-03 MED ORDER — ALPRAZOLAM 0.5 MG PO TABS: 0.5000 mg | ORAL_TABLET | Freq: Three times a day (TID) | ORAL | Status: DC | PRN

## 2017-12-03 MED ORDER — SERTRALINE HCL 50 MG PO TABS
50.0000 mg | ORAL_TABLET | Freq: Every day | ORAL | Status: DC
  Administered 2017-12-03 – 2017-12-06 (×4): 50 mg via ORAL
  Filled 2017-12-03 (×4): qty 1

## 2017-12-03 MED ORDER — LETROZOLE 2.5 MG PO TABS
2.5000 mg | ORAL_TABLET | Freq: Every day | ORAL | Status: DC
  Administered 2017-12-03 – 2017-12-06 (×4): 2.5 mg via ORAL
  Filled 2017-12-03 (×4): qty 1

## 2017-12-03 MED ORDER — PROCHLORPERAZINE MALEATE 10 MG PO TABS: 10.0000 mg | ORAL_TABLET | Freq: Four times a day (QID) | ORAL | Status: DC | PRN

## 2017-12-03 MED ORDER — CARVEDILOL 6.25 MG PO TABS
6.2500 mg | ORAL_TABLET | Freq: Two times a day (BID) | ORAL | Status: DC
  Administered 2017-12-04 – 2017-12-06 (×5): 6.25 mg via ORAL
  Filled 2017-12-03 (×5): qty 1

## 2017-12-03 MED ORDER — ACYCLOVIR 200 MG PO CAPS
400.0000 mg | ORAL_CAPSULE | Freq: Two times a day (BID) | ORAL | Status: DC
  Administered 2017-12-03 – 2017-12-06 (×6): 400 mg via ORAL
  Filled 2017-12-03 (×7): qty 2

## 2017-12-03 MED ORDER — MAGNESIUM OXIDE 400 (241.3 MG) MG PO TABS
400.0000 mg | ORAL_TABLET | Freq: Every day | ORAL | Status: DC
  Administered 2017-12-04 – 2017-12-06 (×3): 400 mg via ORAL
  Filled 2017-12-03 (×3): qty 1

## 2017-12-03 MED ORDER — FLUCONAZOLE 100 MG PO TABS
100.0000 mg | ORAL_TABLET | Freq: Every day | ORAL | Status: DC
  Administered 2017-12-04 – 2017-12-06 (×3): 100 mg via ORAL
  Filled 2017-12-03 (×3): qty 1

## 2017-12-03 MED ORDER — ENOXAPARIN SODIUM 40 MG/0.4ML ~~LOC~~ SOLN
40.0000 mg | SUBCUTANEOUS | Status: DC
  Administered 2017-12-03 – 2017-12-05 (×3): 40 mg via SUBCUTANEOUS
  Filled 2017-12-03 (×3): qty 0.4

## 2017-12-03 MED ORDER — SODIUM CHLORIDE 0.9 % IV SOLN
Freq: Once | INTRAVENOUS | Status: AC
  Administered 2017-12-03: 14:00:00 via INTRAVENOUS
  Filled 2017-12-03: qty 250

## 2017-12-03 MED ORDER — CYCLOBENZAPRINE HCL 5 MG PO TABS
5.0000 mg | ORAL_TABLET | Freq: Three times a day (TID) | ORAL | Status: DC | PRN
  Administered 2017-12-03: 5 mg via ORAL
  Filled 2017-12-03: qty 1

## 2017-12-03 MED ORDER — CELECOXIB 200 MG PO CAPS: 200.0000 mg | ORAL_CAPSULE | Freq: Every day | ORAL | Status: DC | PRN

## 2017-12-03 MED ORDER — SODIUM CHLORIDE 0.9% FLUSH
10.0000 mL | Freq: Once | INTRAVENOUS | Status: AC
  Administered 2017-12-03: 10 mL
  Filled 2017-12-03: qty 10

## 2017-12-03 MED ORDER — VITAMIN B-12 1000 MCG PO TABS
500.0000 ug | ORAL_TABLET | Freq: Every day | ORAL | Status: DC
  Administered 2017-12-03 – 2017-12-06 (×4): 500 ug via ORAL
  Filled 2017-12-03 (×4): qty 1

## 2017-12-03 MED ORDER — HYDROCODONE-ACETAMINOPHEN 10-325 MG PO TABS: 1.0000 | ORAL_TABLET | Freq: Four times a day (QID) | ORAL | Status: DC | PRN

## 2017-12-03 NOTE — Patient Instructions (Signed)
Implanted Port Home Guide An implanted port is a type of central line that is placed under the skin. Central lines are used to provide IV access when treatment or nutrition needs to be given through a person's veins. Implanted ports are used for long-term IV access. An implanted port may be placed because:  You need IV medicine that would be irritating to the small veins in your hands or arms.  You need long-term IV medicines, such as antibiotics.  You need IV nutrition for a long period.  You need frequent blood draws for lab tests.  You need dialysis.  Implanted ports are usually placed in the chest area, but they can also be placed in the upper arm, the abdomen, or the leg. An implanted port has two main parts:  Reservoir. The reservoir is round and will appear as a small, raised area under your skin. The reservoir is the part where a needle is inserted to give medicines or draw blood.  Catheter. The catheter is a thin, flexible tube that extends from the reservoir. The catheter is placed into a large vein. Medicine that is inserted into the reservoir goes into the catheter and then into the vein.  How will I care for my incision site? Do not get the incision site wet. Bathe or shower as directed by your health care provider. How is my port accessed? Special steps must be taken to access the port:  Before the port is accessed, a numbing cream can be placed on the skin. This helps numb the skin over the port site.  Your health care provider uses a sterile technique to access the port. ? Your health care provider must put on a mask and sterile gloves. ? The skin over your port is cleaned carefully with an antiseptic and allowed to dry. ? The port is gently pinched between sterile gloves, and a needle is inserted into the port.  Only "non-coring" port needles should be used to access the port. Once the port is accessed, a blood return should be checked. This helps ensure that the port  is in the vein and is not clogged.  If your port needs to remain accessed for a constant infusion, a clear (transparent) bandage will be placed over the needle site. The bandage and needle will need to be changed every week, or as directed by your health care provider.  Keep the bandage covering the needle clean and dry. Do not get it wet. Follow your health care provider's instructions on how to take a shower or bath while the port is accessed.  If your port does not need to stay accessed, no bandage is needed over the port.  What is flushing? Flushing helps keep the port from getting clogged. Follow your health care provider's instructions on how and when to flush the port. Ports are usually flushed with saline solution or a medicine called heparin. The need for flushing will depend on how the port is used.  If the port is used for intermittent medicines or blood draws, the port will need to be flushed: ? After medicines have been given. ? After blood has been drawn. ? As part of routine maintenance.  If a constant infusion is running, the port may not need to be flushed.  How long will my port stay implanted? The port can stay in for as long as your health care provider thinks it is needed. When it is time for the port to come out, surgery will be   done to remove it. The procedure is similar to the one performed when the port was put in. When should I seek immediate medical care? When you have an implanted port, you should seek immediate medical care if:  You notice a bad smell coming from the incision site.  You have swelling, redness, or drainage at the incision site.  You have more swelling or pain at the port site or the surrounding area.  You have a fever that is not controlled with medicine.  This information is not intended to replace advice given to you by your health care provider. Make sure you discuss any questions you have with your health care provider. Document  Released: 01/19/2005 Document Revised: 06/27/2015 Document Reviewed: 09/26/2012 Elsevier Interactive Patient Education  2017 [employer]  

## 2017-12-03 NOTE — Telephone Encounter (Signed)
FYI Patient coming in today for [facility] reports the following.  "Fever 100.4  at 9:00 this morning.    Took two tylenol with water.  Nothing to eat or drink yet today.  No pain in mouth or trouble swallowing.    No trouble urinating.    No cough or trouble breathing.    New lower back pain with spasms started yesterday.  This morning started feeling a chest soreness.    A little nausea but no vomiting.  Drink maybe three bottled water each day.    Constipated.  Tuesday night at 2:00 am I had to dig it out.  Last BM on Wednesday morning was balls.  I have something to help bowels.    No pain, bloating or swelling to my stomach."      Scheduling message for lab/flush/[facility] today beginning at 11:00 am, Patient driver in route from [place].  Says she can arrive at 10:30 am.  Communicated to expect possible wait time for today's work-in due to previously scheduled patients.

## 2017-12-03 NOTE — H&P (Addendum)
History and Physical  [name] DQQ:[id number] DOB: 31-Jan-1941 DOA: 12/03/2017  Referring physician: EDP PCP: Vicenta Aly, FNP   Chief Complaint: sent from cancer center due to neutropenic fever  HPI: [name] is a 77 y.o. 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  last chemo on 10/25, last neulasta on 10/28. patient reports fever of 100.5 at home, malaise, mouth sore, also concerning for yeast infection, cancer center sent blood culture, ua and urine culture, give one does of levequin and 500cc ivf, request admission, vital stable, admit to med surg.  She denies chest pain, no sob, no edema, no cough,  no abdominal pain, no n/v, no diarrhea.  Review of Systems:  Detail per HPI, Review of systems are otherwise negative  Past Medical History:  Diagnosis Date  . ALLERGIC RHINITIS 12/13/2007  . ANXIETY 06/04/2008  . Blood transfusion 1964   post childbirth  . 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 Chronic combined systolic (congestive) and diastolic (congestive) heart failure ([place])   . DEGENERATIVE JOINT DISEASE 12/17/2006  . DEPRESSION 08/10/2007  . Dilated cardiomyopathy ([place]) 05/29/2015   a. Dx 05/2015 - EF 35-40% - felt secondary to XRT and chemo for breast CA. Normal coronaries by cath.  . DM type 2 (diabetes mellitus, type 2) ([place])   . Dyspnea    sometimes when walking , sometimes when sitting  . Essential hypertension   . GERD 12/17/2006  . History of blood transfusion    after child birth  . History of chemotherapy   . History of hiatal hernia   . History of radiation therapy 10/07/11-11/24/11   left breast,5040 cGy/28 sessions,l chest wall boost=1000cGy/5 sesions  . 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 Hyperlipidemia   . 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 OBESITY  06/04/2008  . 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 Pneumonia   . Premature atrial contractions   . Sinus bradycardia    a. 2018 event monitor which showed NSR with average HR 71, frequent PACs, occasional PVCs, and sinus bradycardia as low as 50.   Past Surgical History:  Procedure Laterality Date  . bladder tack  1974  . BREAST EXCISIONAL BIOPSY  11/10/1988   left benign Dr Margot Chimes  . BREAST EXCISIONAL BIOPSY  03/04/1983   Right - two areas - Dr Margot Chimes  . BREAST EXCISIONAL BIOPSY  10/09/1980   right - Dr Margot Chimes  . BREAST EXCISIONAL BIOPSY  10/18/1979   left - Dr Margot Chimes  . BREAST EXCISIONAL BIOPSY  01/02/1994   right - Dr Margot Chimes  . BREAST EXCISIONAL BIOPSY  2017   right  . BREAST SURGERY    . CARDIAC CATHETERIZATION N/A 05/29/2015   Procedure: Left Heart Cath and Coronary Angiography;  Surgeon: Belva Crome, MD;  Location: [facility];  Service: Cardiovascular;  Laterality: N/A;  . CHOLECYSTECTOMY  02/23/1991   LC - Dr Margot Chimes  . COLONOSCOPY    . ENTEROCELE REPAIR  06/08   Dr. Cletis Media  . EVACUATION BREAST HEMATOMA  07/01/2011   Procedure: EVACUATION HEMATOMA BREAST;  Surgeon: Haywood Lasso, MD;  Location: [facility];  Service: General;  Laterality: Left;  Drainage of left mastectomy seroma  .  EYE SURGERY Right    catartact  . HIP SURGERY Right 08/27/2010   DR. Maureen Ralphs-  "burcitis removal"  . LAMINECTOMY  1993   s/p-Dr. Rita Ohara  . MASTECTOMY Left 03/23/2011  . MODIFIED RADICAL MASTECTOMY W/ AXILLARY LYMPH NODE DISSECTION Left 03-30-11  . PORT-A-CATH REMOVAL  12/02/2011   Procedure: REMOVAL PORT-A-CATH;  Surgeon: Haywood Lasso, MD;  Location: [facility];  Service: General;  Laterality: N/A;  . PORTACATH PLACEMENT  04/13/2011   Procedure: INSERTION PORT-A-CATH;  Surgeon: Haywood Lasso, MD;  Location: [facility];  Service: General;  Laterality: N/A;  porta cath placement,removal of J-P drain  . PORTACATH PLACEMENT Right  11/25/2017   Procedure: INSERTION PORT-A-CATH WITH [place];  Surgeon: Rolm Bookbinder, MD;  Location: [facility];  Service: General;  Laterality: Right;  . RADIOACTIVE SEED GUIDED EXCISIONAL BREAST BIOPSY Right 08/09/2015   Procedure: RIGHT RADIOACTIVE SEED GUIDED EXCISIONAL BREAST BIOPSY, RIGHT NIPPLE LESION EXCISION;  Surgeon: Rolm Bookbinder, MD;  Location: [facility];  Service: General;  Laterality: Right;  . ROTATOR CUFF REPAIR  8/03   right, Dr. Tonita Cong  . ROTATOR CUFF REPAIR  10/26/2008   left, Dr. Rush Farmer  . TONSILLECTOMY    . VAGINAL HYSTERECTOMY  1974   partial    Social History:  reports that she has never smoked. She has never used smokeless tobacco. She reports that she does not drink alcohol or use drugs. Patient lives at home with family & is able to participate in activities of daily living independently   Allergies  Allergen Reactions  . Cephalexin Hives    REACTION: hives  . Clindamycin Hives    REACTION: hives  . Oxycodone-Acetaminophen Hives and Itching  . Rocephin [Ceftriaxone Sodium In Dextrose] Hives  . Sulfonamide Derivatives Hives    REACTION: hives  . Dilaudid [Hydromorphone Hcl] Itching    Family History  Problem Relation Age of Onset  . COPD Mother   . Arthritis Mother   . Emphysema Mother   . Diabetes Brother   . Cancer Brother        prostate  . Cancer Father        malignant brain tumor  . Mental illness Paternal Grandfather   . Heart disease Other        Sibling  . Diabetes Other        Sibling       Prior to Admission medications   Medication Sig Start Date End Date Taking? Authorizing Provider  acyclovir (ZOVIRAX) 200 MG capsule TAKE 2 CAPSULES BY MOUTH 3 TIMES DAILY Patient taking differently: Take 400 mg by mouth 3 (three) times daily. Patient takes 2 tables 1 time a day 10/23/13   Noralee Space, MD  ALPRAZolam Duanne Moron) 0.5 MG tablet TAKE ONE-HALF TO ONE TABLET BY MOUTH THREE TIMES DAILY AS NEEDED FOR  NERVES 02/23/13   Noralee Space, MD  atorvastatin (LIPITOR) 40 MG tablet Take 40 mg by mouth daily.    [provider]  carvedilol (COREG) 6.25 MG tablet TAKE 1 TABLET TWICE DAILY WITH A MEAL Patient taking differently: Take 6.25 mg by mouth 2 (two) times daily with a meal.  05/27/17   Turner, Eber Hong, MD  celecoxib (CELEBREX) 200 MG capsule Take 200 mg by mouth daily as needed for pain. And inflammation 10/26/17   [provider]  cholecalciferol (VITAMIN D) 1000 UNITS tablet Take 1 tablet (1,000 Units total) by mouth daily. 02/23/13   Noralee Space,  MD  cyanocobalamin 500 MCG tablet Take 1 tablet (500 mcg total) by mouth daily. 02/23/13   Noralee Space, MD  dexamethasone (DECADRON) 4 MG tablet Take 1 tablet (4 mg total) by mouth daily. 1 tablet day before chemo and 1 tablet day after chemo 11/19/17   Nicholas Lose, MD  fluconazole (DIFLUCAN) 100 MG tablet Take 1 tablet (100 mg total) by mouth daily. 12/01/17   Nicholas Lose, MD  HYDROcodone-acetaminophen (NORCO) 10-325 MG tablet Take 1 tablet by mouth every 6 (six) hours as needed. 11/25/17 11/25/18  Rolm Bookbinder, MD  HYDROcodone-acetaminophen (NORCO) 5-325 MG tablet Take 1 tablet by mouth every 4 (four) hours as needed. 11/15/17   Daleen Bo, MD  Insulin Pen Needle (RELION PEN NEEDLE 31G/8MM) 31G X 8 MM MISC 1 Device by Does not apply route 4 (four) times daily. 10/05/12   Renato Shin, MD  letrozole [facility]) 2.5 MG tablet TAKE ONE TABLET BY MOUTH DAILY. 04/29/17   Nicholas Lose, MD  lidocaine-prilocaine (EMLA) cream Apply to affected area once 11/19/17   Nicholas Lose, MD  Liraglutide (VICTOZA) 18 MG/3ML SOPN Inject 3 mLs (18 mg total) into the skin every morning. Patient taking differently: Inject 1.8 mg into the skin every morning.  04/18/15   Nicholas Lose, MD  LORazepam (ATIVAN) 0.5 MG tablet Take 1 tablet (0.5 mg total) by mouth at bedtime as needed for sleep. 11/19/17   Nicholas Lose, MD  losartan (COZAAR) 100 MG tablet Take 1 tablet (100  mg total) by mouth daily. 02/23/13   Noralee Space, MD  magnesium oxide (MAG-OX) 400 MG tablet Take 1 tablet (400 mg total) by mouth daily. 06/11/16   Dunn, Nedra Hai, PA-C  Multiple Vitamins-Minerals (ONE-A-DAY WOMENS 50+ ADVANTAGE) TABS Take 1 tablet by mouth daily. 02/23/13   Noralee Space, MD  omeprazole (PRILOSEC) 40 MG capsule Take 40 mg by mouth daily.    [provider]  ondansetron (ZOFRAN) 8 MG tablet Take 1 tablet (8 mg total) by mouth 2 (two) times daily as needed for refractory nausea / vomiting. 11/19/17   Nicholas Lose, MD  prochlorperazine (COMPAZINE) 10 MG tablet Take 1 tablet (10 mg total) by mouth every 6 (six) hours as needed (Nausea or vomiting). 11/19/17   Nicholas Lose, MD  sertraline (ZOLOFT) 50 MG tablet Take 1 tablet (50 mg total) by mouth daily. 02/23/13   Noralee Space, MD  spironolactone (ALDACTONE) 25 MG tablet TAKE 1 TABLET BY MOUTH EVERY DAY 07/31/16   Sueanne Margarita, MD  tolterodine (DETROL LA) 4 MG 24 hr capsule Take 1 capsule by mouth daily. 06/09/16   [provider]    Physical Exam: BP (!) 145/83 (BP Location: Right Arm)   Pulse 94   Temp 99.1 F (37.3 C) (Oral)   Resp 18   SpO2 99%   General:  Pale, Frail, chronically ill, NAD, + port-a-cath Eyes: PERRL ENT: mucositis , dry oral mucosa Neck: supple, no JVD Cardiovascular: RRR Respiratory: CTABL Abdomen: soft/NT/ND, positive bowel sounds Skin: no rash Musculoskeletal:  No edema Psychiatric: calm/cooperative Neurologic: no focal findings            Labs on Admission:  Basic Metabolic Panel: Recent Labs  Lab 12/01/17 0929 12/03/17 1213  NA 139 136  K 4.3 4.6  CL 103 100  CO2 27 26  GLUCOSE 122* 103*  BUN 22 14  CREATININE 0.84 0.95  CALCIUM 9.8 9.9   Liver Function Tests: Recent Labs  Lab 12/01/17 0929 12/03/17  1213  AST 61* 47*  ALT 61* 49*  ALKPHOS 122 126  BILITOT 1.1 0.9  PROT 6.2* 6.2*  ALBUMIN 2.9* 2.8*   No results for input(s): LIPASE, AMYLASE in the  last 168 hours. No results for input(s): AMMONIA in the last 168 hours. CBC: Recent Labs  Lab 12/01/17 0929 12/03/17 1213  WBC 2.6* 3.4*  NEUTROABS 1.3* 0.8*  HGB 13.6 13.5  HCT 40.7 40.9  MCV 90.4 91.1  PLT 187 217   Cardiac Enzymes: No results for input(s): CKTOTAL, CKMB, CKMBINDEX, TROPONINI in the last 168 hours.  BNP (last 3 results) No results for input(s): BNP in the last 8760 hours.  ProBNP (last 3 results) No results for input(s): PROBNP in the last 8760 hours.  CBG: No results for input(s): GLUCAP in the last 168 hours.  Radiological Exams on Admission: No results found.   Assessment/Plan Present on Admission: . Chronic combined systolic and diastolic CHF (congestive heart failure) (HCC)    Neutropenic fever -LAST chemo on 10/25, neulasta on 10/28, anc today is 800 -blood culture/urine culture in process, chest x ray ordered -she does has mucositis and c/o yeast infection, will continue home meds acyclovir and diflucan, start magic mouth wash, topical nysatin  -she is allergic to cephalosporin, will empirically start merropenem, awaiting for culture result.  Chronic systolic chf: LVEF has been around 35% at least since 2017 -she is dehydrated, hold spironolactone, losartan for now, start gentle hydration with ns at 75cc/hr for 12hr, reeval volume status in am -continue coreg at home dose.  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  Time spent: 46mns  Hailey ReasonsMD, PhD [facility] Pager [phone] 7PM-7AM, please contact night-coverage at [email or web address], password [email or web address]

## 2017-12-03 NOTE — Progress Notes (Signed)
[facility] Progress Note   [name] [id number] Nov 14, 1940 77 y.o.  [name] is managed by Dr. Nicholas Lose  Actively treated with chemotherapy/immunotherapy: yes  Current Therapy: Taxotere with PEG filgrastim support  Last Treated: 11/26/2017 (cycle 1) 11/29/2017 (PEG filgrastim)  Assessment: Plan:    Febrile neutropenia ([place]) - Plan: Urinalysis, Complete w Microscopic, Urine Culture, Culture, Blood, Culture, Blood, levofloxacin (LEVAQUIN) IVPB 500 mg, 0.9 %  sodium chloride infusion  Primary cancer of lower-inner quadrant of left female breast ([place])   Febrile neutropenia: Patient presents to the office today status post cycle 1 of Taxotere dosed on 11/26/2017 with PEG filgrastim dosed on 11/29/2017.  A CBC returned today with a WBC of 3.4 and an ANC of 0.8.  The patient reported having a fever of 100.5 earlier today.  She took 2 Tylenol before presenting to the office.  Her temperature was 97.9 at her visit today.  She was given Levaquin 500 mg IV x1 after blood cultures x2, urine culture, and a urinalysis were collected.  Dr. Florencia Reasons graciously agreed to take this patient as a direct admission to [facility]  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 Lindi Adie.  He is scheduled to return for follow-up on 12/17/2017.  Please see After Visit Summary for patient specific instructions.  Future Appointments  Date Time Provider [place]  12/17/2017  8:00 AM [facility] LAB 4 [facility] None  12/17/2017  8:15 AM [facility] [place] FLUSH [facility] None  12/17/2017  8:45 AM Nicholas Lose, MD [facility] None  12/17/2017 10:00 AM [facility] INFUSION [facility] None  01/07/2018  7:45 AM [facility] LAB 5 [facility] None  01/07/2018  8:00 AM [facility] [place] FLUSH [facility] None  01/07/2018  8:30 AM  Nicholas Lose, MD [facility] None  01/07/2018  9:00 AM [facility] INFUSION [facility] None  01/28/2018  7:45 AM [facility] LAB 3 [facility] None  01/28/2018  8:00 AM [facility] [place] FLUSH [facility] None  01/28/2018  8:30 AM Nicholas Lose, MD [facility] None  01/28/2018  9:00 AM [facility] INFUSION [facility] None  04/28/2018  2:15 PM Nicholas Lose, MD [facility] None    Orders Placed This Encounter  Procedures  . Urine Culture  . Culture, Blood  . Culture, Blood  . Urinalysis, Complete w Microscopic       Subjective:   Patient ID:  [name] is a 77 y.o. (DOB 1940-02-21) female.  Chief Complaint:  Chief Complaint  Patient presents with  . Fever    at home    HPI [name] is a 77 year old female who is managed by Dr. 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  She is status post cycle 1 of Taxotere dosed on 11/26/2017 with PEG filgrastim dosed on 11/29/2017.  She presents to the office today with a report that she feels poorly.  She is having fatigue.  She reports having mouth sores, a possible fungal infection in her genital area, generalized pain, back spasms, and a fever of 100.5 earlier today.  She took 2 Tylenol before coming to the office.  Her temperature was noted to be 97.9 at her visit today.   Medications: I have reviewed the patient's current medications.  Allergies:  Allergies  Allergen Reactions  . Cephalexin Hives    REACTION: hives  . Clindamycin Hives  REACTION: hives  . Oxycodone-Acetaminophen Hives and Itching  . Rocephin [Ceftriaxone Sodium In Dextrose] Hives  . Sulfonamide Derivatives Hives    REACTION: hives  . Dilaudid [Hydromorphone Hcl] Itching    Past Medical History:  Diagnosis Date  . ALLERGIC RHINITIS 12/13/2007  . ANXIETY 06/04/2008  . Blood transfusion 1964   post childbirth  . Breast cancer, IXC, Right, receptor +, her 2 neg 02/20/2011   left, ER/PR  +, HER 2 -  . Chronic combined systolic (congestive) and diastolic (congestive) heart failure ([place])   . DEGENERATIVE JOINT DISEASE 12/17/2006  . DEPRESSION 08/10/2007  . Dilated cardiomyopathy ([place]) 05/29/2015   a. Dx 05/2015 - EF 35-40% - felt secondary to XRT and chemo for breast CA. Normal coronaries by cath.  . DM type 2 (diabetes mellitus, type 2) ([place])   . Dyspnea    sometimes when walking , sometimes when sitting  . Essential hypertension   . GERD 12/17/2006  . History of blood transfusion    after child birth  . History of chemotherapy   . History of hiatal hernia   . History of radiation therapy 10/07/11-11/24/11   left breast,5040 cGy/28 sessions,l chest wall boost=1000cGy/5 sesions  . 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 Hyperlipidemia   . 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 OBESITY 06/04/2008  . 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 Pneumonia   . Premature atrial contractions   . Sinus bradycardia    a. 2018 event monitor which showed NSR with average HR 71, frequent PACs, occasional PVCs, and sinus bradycardia as low as 50.    Past Surgical History:  Procedure Laterality Date  . bladder tack  1974  . BREAST EXCISIONAL BIOPSY  11/10/1988   left benign Dr Margot Chimes  . BREAST EXCISIONAL BIOPSY  03/04/1983   Right - two areas - Dr Margot Chimes  . BREAST EXCISIONAL BIOPSY  10/09/1980   right - Dr Margot Chimes  . BREAST EXCISIONAL BIOPSY  10/18/1979   left - Dr Margot Chimes  . BREAST EXCISIONAL BIOPSY  01/02/1994   right - Dr Margot Chimes  . BREAST EXCISIONAL BIOPSY  2017   right  . BREAST SURGERY    . CARDIAC CATHETERIZATION N/A 05/29/2015   Procedure: Left Heart Cath and Coronary Angiography;  Surgeon: Belva Crome, MD;  Location: [facility];  Service: Cardiovascular;  Laterality: N/A;  . CHOLECYSTECTOMY  02/23/1991   LC - Dr Margot Chimes  . COLONOSCOPY    . ENTEROCELE REPAIR  06/08   Dr. Cletis Media  . EVACUATION BREAST HEMATOMA  07/01/2011   Procedure: EVACUATION HEMATOMA BREAST;  Surgeon: Haywood Lasso,  MD;  Location: [facility];  Service: General;  Laterality: Left;  Drainage of left mastectomy seroma  . EYE SURGERY Right    catartact  . HIP SURGERY Right 08/27/2010   DR. Maureen Ralphs-  "burcitis removal"  . LAMINECTOMY  1993   s/p-Dr. Rita Ohara  . MASTECTOMY Left 03/23/2011  . MODIFIED RADICAL MASTECTOMY W/ AXILLARY LYMPH NODE DISSECTION Left 03-30-11  . PORT-A-CATH REMOVAL  12/02/2011   Procedure: REMOVAL PORT-A-CATH;  Surgeon: Haywood Lasso, MD;  Location: [facility];  Service: General;  Laterality: N/A;  . PORTACATH PLACEMENT  04/13/2011   Procedure: INSERTION PORT-A-CATH;  Surgeon: Haywood Lasso, MD;  Location: [facility];  Service: General;  Laterality: N/A;  porta cath placement,removal of J-P drain  . PORTACATH PLACEMENT Right 11/25/2017   Procedure: INSERTION PORT-A-CATH WITH [place];  Surgeon: Rolm Bookbinder, MD;  Location: [facility];  Service: General;  Laterality: Right;  . RADIOACTIVE SEED GUIDED EXCISIONAL BREAST BIOPSY Right 08/09/2015   Procedure: RIGHT RADIOACTIVE SEED GUIDED EXCISIONAL BREAST BIOPSY, RIGHT NIPPLE LESION EXCISION;  Surgeon: Rolm Bookbinder, MD;  Location: [facility];  Service: General;  Laterality: Right;  . ROTATOR CUFF REPAIR  8/03   right, Dr. Tonita Cong  . ROTATOR CUFF REPAIR  10/26/2008   left, Dr. Rush Farmer  . TONSILLECTOMY    . VAGINAL HYSTERECTOMY  1974   partial     Family History  Problem Relation Age of Onset  . COPD Mother   . Arthritis Mother   . Emphysema Mother   . Diabetes Brother   . Cancer Brother        prostate  . Cancer Father        malignant brain tumor  . Mental illness Paternal Grandfather   . Heart disease Other        Sibling  . Diabetes Other        Sibling     Social History   Socioeconomic History  . Marital status: Married    Spouse name: Not on file  . Number of children: 3  . Years of education: Not on file  . Highest education level: Not on file  Occupational History     Employer: RETIRED  Social Needs  . Financial resource strain: Not on file  . Food insecurity:    Worry: Not on file    Inability: Not on file  . Transportation needs:    Medical: Not on file    Non-medical: Not on file  Tobacco Use  . Smoking status: Never Smoker  . Smokeless tobacco: Never Used  Substance and Sexual Activity  . Alcohol use: No  . Drug use: No  . Sexual activity: Yes  Lifestyle  . Physical activity:    Days per week: Not on file    Minutes per session: Not on file  . Stress: Not on file  Relationships  . Social connections:    Talks on phone: Not on file    Gets together: Not on file    Attends religious service: Not on file    Active member of club or organization: Not on file    Attends meetings of clubs or organizations: Not on file    Relationship status: Not on file  . Intimate partner violence:    Fear of current or ex partner: Not on file    Emotionally abused: Not on file    Physically abused: Not on file    Forced sexual activity: Not on file  Other Topics Concern  . 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   Please see review of systems for further details on the patient's review from today.   Review of Systems:  Review of Systems  Constitutional: Positive for appetite change and fever. Negative for chills, diaphoresis and unexpected weight change.  HENT: Negative for trouble swallowing.        Mouth sores  Respiratory: Negative for cough, chest tightness and shortness of breath.   Cardiovascular: Negative for chest pain and palpitations.  Gastrointestinal:  Negative for abdominal pain, constipation, diarrhea, nausea and vomiting.  Musculoskeletal:  Positive for back pain.  Skin:       Suspected cutaneous yeast infection in the genital area and buttocks.  Neurological: Negative for dizziness and headaches.    Objective:   Physical Exam:  BP 112/86 (BP Location: Right Arm, Patient Position: Sitting)   Pulse 94   Temp 97.9 F (36.6 C) (Oral)   Resp 17   Ht '5\' 8"'$  (1.727 m)   Wt 167 lb 8 oz (76 kg)   SpO2 94%   BMI 25.47 kg/m  ECOG: 1  Physical Exam  Constitutional: No distress.  HENT:  Head: Normocephalic and atraumatic.  Oral mucosa appears dry.  Cardiovascular: Normal rate, regular rhythm and normal heart sounds. Exam reveals no gallop and no friction rub.  No murmur heard. And accessed right chest wall Port-A-Cath is noted.  The patient has multiple areas of erythema on her chest wall consistent with recently applied dressings.  Pulmonary/Chest: Effort normal and breath sounds normal. No respiratory distress. She has no wheezes. She has no rales.  Abdominal: Soft. Bowel sounds are normal. She exhibits no distension. There is no tenderness. There is no rebound and no guarding.  Neurological: She is alert. Coordination (The patient is ambulating with use of a wheelchair.) abnormal.  Skin: Skin is warm and dry. No rash noted. She is not diaphoretic. No erythema.  Psychiatric: She has a normal mood and affect. Her behavior is normal. Judgment and thought content normal.    Lab Review:     Component Value Date/Time   NA 136 12/03/2017 1213   NA 142 04/29/2016 1438   K 4.6 12/03/2017 1213   K 4.1 04/29/2016 1438   CL 100 12/03/2017 1213   CL 102 02/25/2012 0908   CO2 26 12/03/2017 1213   CO2 31 (H) 04/29/2016 1438   GLUCOSE 103 (H) 12/03/2017 1213   GLUCOSE 93 04/29/2016 1438   GLUCOSE 232 (H) 02/25/2012 0908   BUN 14 12/03/2017 1213   BUN 14.4 04/29/2016 1438   CREATININE 0.95 12/03/2017 1213   CREATININE 0.8 04/29/2016 1438   CALCIUM 9.9 12/03/2017 1213   CALCIUM 10.3 04/29/2016 1438   PROT 6.2 (L) 12/03/2017  1213   PROT 7.0 04/29/2016 1438   ALBUMIN 2.8 (L) 12/03/2017 1213   ALBUMIN 3.7 04/29/2016 1438   AST 47 (H) 12/03/2017 1213   AST 15 04/29/2016 1438   ALT 49 (H) 12/03/2017 1213   ALT 15 04/29/2016 1438   ALKPHOS 126 12/03/2017 1213   ALKPHOS 127 04/29/2016 1438   BILITOT 0.9 12/03/2017 1213   BILITOT 0.53 04/29/2016 1438   GFRNONAA 56 (L) 12/03/2017 1213   GFRAA >60 12/03/2017 1213       Component Value Date/Time   WBC 3.4 (L) 12/03/2017 1213   WBC 7.2 11/15/2017 1248   RBC 4.49 12/03/2017 1213   HGB 13.5 12/03/2017 1213   HGB 14.4 04/29/2016 1438   HCT 40.9 12/03/2017 1213   HCT 43.2 04/29/2016 1438   PLT 217 12/03/2017 1213   PLT 227 04/29/2016 1438   MCV 91.1 12/03/2017 1213   MCV 89.9 04/29/2016 1438   MCH 30.1 12/03/2017 1213   MCHC 33.0 12/03/2017 1213   RDW 13.2 12/03/2017 1213   RDW 14.1 04/29/2016 1438   LYMPHSABS 1.5 12/03/2017 1213   LYMPHSABS 2.6 04/29/2016 1438   MONOABS 0.7 12/03/2017 1213   MONOABS 0.6 04/29/2016 1438   EOSABS 0.1 12/03/2017 1213   EOSABS  0.1 04/29/2016 1438   BASOSABS 0.1 12/03/2017 1213   BASOSABS 0.1 04/29/2016 1438   -------------------------------  Imaging from last 24 hours (if applicable):  Radiology interpretation: Nm Pet Image Restag (ps) Skull Base To Thigh  Result Date: 11/04/2017 CLINICAL DATA:  Subsequent treatment strategy for breast cancer. EXAM: NUCLEAR MEDICINE PET SKULL BASE TO THIGH TECHNIQUE: 8.5 mCi F-18 FDG was injected intravenously. Full-ring PET imaging was performed from the skull base to thigh after the radiotracer. CT data was obtained and used for attenuation correction and anatomic localization. Fasting blood glucose: 106 mg/dl COMPARISON:  Multiple exams, including prior PET-CT from 04/09/2011 FINDINGS: Mediastinal blood pool activity: SUV max 2.6 NECK: No significant abnormal hypermetabolic activity in this region. Incidental CT findings: Common carotid atherosclerotic calcification. CHEST: Indistinctly  marginated subpectoral lymph node measuring about 8 mm in short axis on image 54/4 with maximum SUV 5.8. Subtle supraclavicular activity potentially associated with a 5 mm lymph node on image 46/4, maximum SUV 4.8. Incidental CT findings: Left mastectomy. Small type 1 hiatal hernia. ABDOMEN/PELVIS: Widespread metastatic disease to the liver with heavy tumor burden in all lobes. An index hypodense right hepatic lobe mass measures 7.7 by 5.4 cm on image 100/4 with maximum SUV of 16.6. A portacaval lymph node measuring 0.9 cm in short axis has a maximum SUV of 5.4. New 1.0 by 1.1 cm right adrenal nodule, maximum SUV 5.2. No discrete left adrenal mass, left adrenal maximum SUV 3.7. Aortocaval lymph node measuring 0.9 cm in short axis on image 126/4 has a maximum SUV of 9.2. Incidental CT findings: Aortoiliac atherosclerotic vascular disease. Cholecystectomy. SKELETON: No significant abnormal hypermetabolic activity in this region. Incidental CT findings: Degenerative glenohumeral arthropathy, left greater than right. IMPRESSION: 1. Heavy burden of metastatic disease to the liver involving all lobes. An index 7.7 cm right hepatic lobe mass has a maximum SUV of 16.6. 2. Small but hypermetabolic left supraclavicular, left subpectoral, portacaval, and aortocaval adenopathy indicating malignancy. 3. Other imaging findings of potential clinical significance: Aortic Atherosclerosis (ICD10-I70.0). Left mastectomy. Small type 1 hiatal hernia. Electronically Signed   By:  Clines M.D.   On: 11/04/2017 09:44   US Biopsy (liver)  Result Date: 11/12/2017 INDICATION: 77 year old female with a history of breast cancer and new multifocal hepatic metastatic disease. She presents for ultrasound-guided biopsy of 1 of the hepatic lesions to confirm tissue diagnosis. EXAM: Ultrasound-guided core biopsy, liver MEDICATIONS: None. ANESTHESIA/SEDATION: Moderate (conscious) sedation was employed during this procedure. A total of  Versed 2 mg and Fentanyl 50 mcg was administered intravenously. Moderate Sedation Time: 11 minutes. The patient's level of consciousness and vital signs were monitored continuously by radiology nursing throughout the procedure under my direct supervision. FLUOROSCOPY TIME:  Fluoroscopy Time: 0 minutes 0 seconds (0 mGy). COMPLICATIONS: None immediate. PROCEDURE: Informed written consent was obtained from the patient after a thorough discussion of the procedural risks, benefits and alternatives. All questions were addressed. A timeout was performed prior to the initiation of the procedure. Ultrasound was used to interrogate the right upper quadrant. There are numerous hypoechoic masses scattered throughout the liver. A suitable lesion in the inferior aspect of hepatic segment 6 measuring 2.9 x 2.4 cm was selected. The overlying skin was marked and sterilely prepped and draped in standard fashion using chlorhexidine skin prep. Local anesthesia was attained by infiltration with 1% lidocaine. A small dermatotomy was made. Under real-time sonographic guidance, a 17 gauge introducer needle was carefully advanced into the lesion. Multiple 18 gauge core biopsies were  then coaxially obtained using the bio Pince automated biopsy device. Biopsy specimens were placed in formalin and delivered to pathology for further analysis. As the introducer needle was removed, the biopsy tract was embolized with a Gel-Foam slurry. Post biopsy ultrasound imaging demonstrates no evidence of immediate complication. The patient tolerated the procedure well. IMPRESSION: Ultrasound-guided core biopsy of liver lesion. Electronically Signed   By: Jacqulynn Cadet M.D.   On: 11/12/2017 13:55   Dg Fluoro Guide Cv Line Right  Result Date: 11/25/2017 CLINICAL DATA:  Surgically placed port a catheter. EXAM: CENTRAL VENOUS CATHETER WITH FLUOROSCOPY FLUOROSCOPY TIME:  22 seconds COMPARISON:  PET-CT-12/02/2017 FINDINGS: A single spot intraoperative  fluoroscopic images of the upper chest is provided for review. Provided image demonstrates a presumed right internal jugular approach port a catheter with tip overlying the superior cavoatrial junction. No definite pneumothorax. There is rightward deviation the tracheal air column at the level of the thoracic inlet potentially accentuated due to obliquity. Radiopaque surgical support apparatus is seen about the inferior medial aspect of chest, presumably external to the patient. IMPRESSION: Post surgically placed right jugular approach port a catheter. Electronically Signed   By: Sandi Mariscal M.D.   On: 11/25/2017 08:25

## 2017-12-03 NOTE — Progress Notes (Signed)
Two sets of blood cultures drawn from port and RA before IV abx started.

## 2017-12-03 NOTE — Telephone Encounter (Signed)
Thank you :)

## 2017-12-03 NOTE — Progress Notes (Signed)
Report given to floor RN for 1609.  Pt transported via [facility] with husband and belongings.

## 2017-12-04 ENCOUNTER — Other Ambulatory Visit: Payer: Self-pay

## 2017-12-04 DIAGNOSIS — E119 Type 2 diabetes mellitus without complications: Secondary | ICD-10-CM

## 2017-12-04 LAB — CBC WITH DIFFERENTIAL/PLATELET
Band Neutrophils: 8 %
Basophils Absolute: 0.2 K/uL — ABNORMAL HIGH (ref 0.0–0.1)
Basophils Relative: 2 %
Eosinophils Absolute: 0.1 K/uL (ref 0.0–0.5)
Eosinophils Relative: 1 %
HCT: 39.5 % (ref 36.0–46.0)
Hemoglobin: 12.7 g/dL (ref 12.0–15.0)
Lymphocytes Relative: 27 %
Lymphs Abs: 3 K/uL (ref 0.7–4.0)
MCH: 29.7 pg (ref 26.0–34.0)
MCHC: 32.2 g/dL (ref 30.0–36.0)
MCV: 92.3 fL (ref 80.0–100.0)
Metamyelocytes Relative: 2 %
Monocytes Absolute: 0.8 K/uL (ref 0.1–1.0)
Monocytes Relative: 7 %
Myelocytes: 3 %
Neutro Abs: 7 K/uL (ref 1.7–7.7)
Neutrophils Relative %: 50 %
Platelets: 204 K/uL (ref 150–400)
RBC: 4.28 MIL/uL (ref 3.87–5.11)
RDW: 13.7 % (ref 11.5–15.5)
WBC: 11.1 K/uL — ABNORMAL HIGH (ref 4.0–10.5)

## 2017-12-04 LAB — URINE CULTURE

## 2017-12-04 LAB — BASIC METABOLIC PANEL WITH GFR
Anion gap: 8 (ref 5–15)
BUN: 11 mg/dL (ref 8–23)
CO2: 26 mmol/L (ref 22–32)
Calcium: 9.2 mg/dL (ref 8.9–10.3)
Chloride: 105 mmol/L (ref 98–111)
Creatinine, Ser: 0.9 mg/dL (ref 0.44–1.00)
GFR calc Af Amer: >60 mL/min (ref >60)
GFR calc non Af Amer: >60 mL/min (ref >60)
Glucose, Bld: 105 mg/dL — ABNORMAL HIGH (ref 70–99)
Potassium: 4.2 mmol/L (ref 3.5–5.1)
Sodium: 139 mmol/L (ref 135–145)

## 2017-12-04 LAB — GLUCOSE, CAPILLARY
Glucose-Capillary: 114 mg/dL — ABNORMAL HIGH (ref 70–99)
Glucose-Capillary: 198 mg/dL — ABNORMAL HIGH (ref 70–99)
Glucose-Capillary: 149 mg/dL — ABNORMAL HIGH (ref 70–99)
Glucose-Capillary: 108 mg/dL — ABNORMAL HIGH (ref 70–99)

## 2017-12-04 LAB — MAGNESIUM: MAGNESIUM: 1.6 mg/dL — AB (ref 1.7–2.4)

## 2017-12-04 LAB — HEMOGLOBIN A1C
HEMOGLOBIN A1C: 6 % — AB (ref 4.8–5.6)
Mean Plasma Glucose: 125.5 mg/dL

## 2017-12-04 MED ORDER — LOSARTAN POTASSIUM 50 MG PO TABS
25.0000 mg | ORAL_TABLET | Freq: Every day | ORAL | Status: DC
  Administered 2017-12-04 – 2017-12-06 (×3): 25 mg via ORAL
  Filled 2017-12-04 (×3): qty 1

## 2017-12-04 MED ORDER — MAGNESIUM SULFATE 2 GM/50ML IV SOLN
2.0000 g | Freq: Once | INTRAVENOUS | Status: AC
  Administered 2017-12-04: 2 g via INTRAVENOUS
  Filled 2017-12-04: qty 50

## 2017-12-04 NOTE — Progress Notes (Signed)
PROGRESS NOTE  [name]:[id number] DOB: 12/30/1940 DOA: 12/03/2017 PCP: Vicenta Aly, FNP  HPI/Recap of past 24 hours:  Feeling stronger, no fever Mouth sore, not able to eat but able to tolerate ensure Family at bed side  Assessment/Plan: Active Problems:   DM type 2 (diabetes mellitus, type 2) (HCC)   Neutropenia with fever (HCC)   Chronic combined systolic and diastolic CHF (congestive heart failure) ([place])   Port-A-Cath in place   Metastatic breast cancer ([place])   Neutropenic fever ([place])   Neutropenic fever -LAST chemo on 10/25, neulasta on 10/28, anc on presentation is 800 -blood culture/urine culture no growth, chest x ray no acute findings -persistent mucositis , states magic mouth wash helped but still not able to eat due to pain. Continue  Home meds acyclovir -yeast infection, continue home meds diflucan, report topical nysatin helped -she is allergic to cephalosporin,  empirically started oon merropenem, likely stop in am if fever free and wbc continue to improve   Hypomagnesemia: Replace mag IV, repeat lab in am  Chronic systolic chf: -LVEF has been around 35% at least since 2017 -continue coreg at home dose. 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 with elevated lft ER 100%, PR 0%, [name]-2 3+ positive S/p chemo (docetaxel) on 10/25 Continue home meds  Letrozole F/u with oncology Dr Lindi Adie   DVT  prophylaxis: lovenox  Consultants: none  Code Status: full   Family Communication:  Patient and family at bedside  Disposition Plan: home tomorrow, if able to eat  Procedures:  none  Antibiotics:  Meropenem  Acyclovir  diflucan   Objective: BP (!) 145/83 (BP Location: Right Arm)   Pulse 94   Temp 99.1 F (37.3 C) (Oral)   Resp 18   SpO2 99%   Intake/Output Summary (Last 24 hours) at 12/04/2017 1134 Last data filed at 12/04/2017 0300 Gross per 24 hour  Intake 781.2 ml  Output -  Net 781.2 ml   There were no vitals filed for this visit.  Exam: Patient is examined daily including today on 12/04/2017, exams remain the same as of yesterday except that has changed    General:  Frail , pale, appear stronger today , dry oral mucosa, + mucositis   Cardiovascular: RRR  Respiratory: CTABL  Abdomen: Soft/ND/NT, positive BS  Musculoskeletal: No Edema  Neuro: alert, oriented   Data Reviewed: Basic Metabolic Panel: Recent Labs  Lab 12/01/17 0929 12/03/17 1213 12/04/17 0556  NA 139 136 139  K 4.3 4.6 4.2  CL 103 100 105  CO2 _0 GLUCOSE 122* 103* 105*  BUN _1 CREATININE 0.84 0.95 0.90  CALCIUM 9.8 9.9 9.2  MG  --   --  1.6*   Liver Function Tests: Recent Labs  Lab 12/01/17 0929 12/03/17 1213  AST 61* 47*  ALT 61* 49*  ALKPHOS 122 126  BILITOT 1.1 0.9  PROT 6.2* 6.2*  ALBUMIN 2.9* 2.8*  No results for input(s): LIPASE, AMYLASE in the last 168 hours. No results for input(s): AMMONIA in the last 168 hours. CBC: Recent Labs  Lab 12/01/17 0929 12/03/17 1213 12/04/17 0556  WBC 2.6* 3.4* 11.1*  NEUTROABS 1.3* 0.8* 7.0  HGB 13.6 13.5 12.7  HCT 40.7 40.9 39.5  MCV 90.4 91.1 92.3  PLT 187 217 204   Cardiac Enzymes:   No results for input(s): CKTOTAL, CKMB, CKMBINDEX, TROPONINI in the last 168 hours. BNP (last 3 results) No results for input(s): BNP in the last 8760 hours.  ProBNP (last 3 results) No results for input(s):  PROBNP in the last 8760 hours.  CBG: Recent Labs  Lab 12/03/17 2318 12/04/17 0930  GLUCAP 106* 114*    Recent Results (from the past 240 hour(s))  Culture, Blood     Status: None (Preliminary result)   Collection Time: 12/03/17  2:11 PM  Result Value Ref Range Status   Specimen Description   Final    PORTA CATH Performed at [facility], [address] [place], [place] [address]    Special Requests   Final    BOTTLES DRAWN AEROBIC AND ANAEROBIC Blood Culture adequate volume   Culture   Final    NO GROWTH < 24 HOURS Performed at [facility], [place] [address], [place], [place] [address]    Report Status PENDING  Incomplete  Culture, Blood     Status: None (Preliminary result)   Collection Time: 12/03/17  2:14 PM  Result Value Ref Range Status   Specimen Description   Final    BLOOD LEFT ARM Performed at [facility], [place] [address], [place], [place] [address]    Special Requests   Final    BOTTLES DRAWN AEROBIC AND ANAEROBIC Blood Culture adequate volume   Culture   Final    NO GROWTH < 24 HOURS Performed at [facility], [place] [address], [place], [place] [address]    Report Status PENDING  Incomplete     Studies: Dg Chest Port 1 View  Result Date: 12/03/2017 CLINICAL DATA:  Fever.  History breast cancer EXAM: PORTABLE CHEST 1 VIEW COMPARISON:  05/27/2015 FINDINGS: Right jugular Port-A-Cath tip in the mid SVC. Surgical clips left axilla Heart size and vascularity normal. Lungs are clear. Negative for pneumonia. No effusion IMPRESSION: No active disease. Electronically Signed   By: Franchot Gallo M.D.   On: 12/03/2017 18:37    Scheduled Meds: . acyclovir  400 mg Oral BID  . carvedilol  6.25 mg Oral BID WC  . cholecalciferol  1,000 Units Oral Daily  . enoxaparin (LOVENOX) injection  40 mg Subcutaneous Q24H  . feeding supplement (ENSURE ENLIVE)  237 mL Oral BID BM  . fluconazole  100 mg Oral Daily  . insulin  aspart  0-9 Units Subcutaneous TID WC  . letrozole  2.5 mg Oral Daily  . magic mouthwash w/lidocaine  10 mL Oral TID  . magnesium oxide  400 mg Oral Daily  . nystatin cream   Topical BID  . pantoprazole  40 mg Oral Daily  . sertraline  50 mg Oral Daily  . vitamin B-12  500 mcg Oral Daily    Continuous Infusions: . magnesium sulfate 1 - 4 g bolus IVPB    . meropenem (MERREM) IV 1 g (12/04/17 1108)     Time spent: 37mns I have personally reviewed and interpreted on  12/04/2017 daily labs, imagings as discussed above under  date review session and assessment and plans.  I reviewed all nursing notes, pharmacy notes,   vitals, pertinent old records  I have discussed plan of care as described above with RN , patient and family on 12/04/2017   Florencia Reasons MD, PhD  [facility] Pager [phone]. If 7PM-7AM, please contact night-coverage at [email or web address], password [facility] 12/04/2017, 11:34 AM  LOS: 1 day

## 2017-12-05 LAB — CBC WITH DIFFERENTIAL/PLATELET
Abs Immature Granulocytes: 4.25 10*3/uL — ABNORMAL HIGH (ref 0.00–0.07)
BASOS PCT: 0 %
Basophils Absolute: 0 10*3/uL (ref 0.0–0.1)
EOS ABS: 0.2 10*3/uL (ref 0.0–0.5)
Eosinophils Relative: 1 %
HCT: 38 % (ref 36.0–46.0)
Hemoglobin: 12.1 g/dL (ref 12.0–15.0)
IMMATURE GRANULOCYTES: 19 %
Lymphocytes Relative: 11 %
Lymphs Abs: 2.4 10*3/uL (ref 0.7–4.0)
MCH: 29.2 pg (ref 26.0–34.0)
MCHC: 31.8 g/dL (ref 30.0–36.0)
MCV: 91.8 fL (ref 80.0–100.0)
MONO ABS: 1.9 10*3/uL — AB (ref 0.1–1.0)
Monocytes Relative: 9 %
NEUTROS ABS: 13.1 10*3/uL — AB (ref 1.7–7.7)
NRBC: 1 % — AB (ref 0.0–0.2)
Neutrophils Relative %: 60 %
PLATELETS: 216 10*3/uL (ref 150–400)
RBC: 4.14 MIL/uL (ref 3.87–5.11)
RDW: 14.2 % (ref 11.5–15.5)
WBC: 21.9 10*3/uL — AB (ref 4.0–10.5)

## 2017-12-05 LAB — GLUCOSE, CAPILLARY
GLUCOSE-CAPILLARY: 190 mg/dL — AB (ref 70–99)
Glucose-Capillary: 100 mg/dL — ABNORMAL HIGH (ref 70–99)
Glucose-Capillary: 179 mg/dL — ABNORMAL HIGH (ref 70–99)
Glucose-Capillary: 165 mg/dL — ABNORMAL HIGH (ref 70–99)

## 2017-12-05 LAB — BASIC METABOLIC PANEL
ANION GAP: 6 (ref 5–15)
BUN: 10 mg/dL (ref 8–23)
CALCIUM: 8.4 mg/dL — AB (ref 8.9–10.3)
CO2: 27 mmol/L (ref 22–32)
CREATININE: 0.72 mg/dL (ref 0.44–1.00)
Chloride: 107 mmol/L (ref 98–111)
GFR calc Af Amer: >60 mL/min (ref >60)
GLUCOSE: 116 mg/dL — AB (ref 70–99)
Potassium: 3.9 mmol/L (ref 3.5–5.1)
Sodium: 140 mmol/L (ref 135–145)

## 2017-12-05 LAB — MAGNESIUM: Magnesium: 1.9 mg/dL (ref 1.7–2.4)

## 2017-12-05 MED ORDER — LEVOFLOXACIN 750 MG PO TABS
750.0000 mg | ORAL_TABLET | Freq: Every day | ORAL | Status: DC
  Administered 2017-12-05 – 2017-12-06 (×2): 750 mg via ORAL
  Filled 2017-12-05 (×2): qty 1

## 2017-12-05 MED ORDER — LIP MEDEX EX OINT
TOPICAL_OINTMENT | CUTANEOUS | Status: AC
  Administered 2017-12-05: 13:00:00
  Filled 2017-12-05: qty 7

## 2017-12-05 MED ORDER — LACTATED RINGERS IV SOLN
INTRAVENOUS | Status: AC
  Administered 2017-12-05: 1000 mL via INTRAVENOUS

## 2017-12-05 NOTE — Progress Notes (Signed)
PROGRESS NOTE  [name] KXF:[id number] DOB: [birth year] DOA: 12/03/2017 PCP: Vicenta Aly, FNP  HPI/Recap of past 24 hours:  Feeling stronger, but still poor appetite, feel drained when getting up, she is orthostatic, no fever Mouth sore is improving, yeast infection is improving No n/v, no diarrhea,   Assessment/Plan: Active Problems:   DM type 2 (diabetes mellitus, type 2) (HCC)   Neutropenia with fever (HCC)   Chronic combined systolic and diastolic CHF (congestive heart failure) ([place])   Port-A-Cath in place   Metastatic breast cancer ([place])   Neutropenic fever ([place])   Neutropenic fever -LAST chemo on 10/25, neulasta on 10/28, anc on presentation is 800 -blood culture/urine culture no growth, chest x ray no acute findings -persistent mucositis , states magic mouth wash helped , improving. Continue  Home meds acyclovir -yeast infection, continue home meds diflucan, report topical nysatin helped -she is allergic to cephalosporin,  empirically started on merropenem, change to oral levaquin ,   Poor oral intake, orthostatic hypotension Encourage oral intake, restart ivf with LR at 75cc/hr Repeat orthostatic vital signs  Ectopic beats? Denies chest pain Will get ekg   Hypomagnesemia: Replace mag IV, improved  Chronic systolic chf: -LVEF has been around 35% at least since 2017 -continue coreg at home dose. -she is dehydrated on presentation, she received gentle hydration -home meds spironolactone, losartan held on admission, resume low dose losartan today, continue hold spironolactone, as still not able to eat much - still has signs of dehydration and poor oral intake, will restart ivf today, close monitor volume status  HTN:  continue coreg, resume low dose losartan, continue to hold spironolactone  HLD; hold statin in the setting of abnormal liver function  Type II diabetes, controlled, she is not on insulin at home -home meds victoza held,    -blood glucose today is 103 - a1c 6 -start ssi in the hospital  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 with elevated lft ER 100%, PR 0%, HER-2 3+ positive S/p chemo (docetaxel) on 10/25 Continue home meds  Letrozole F/u with oncology Dr Lindi Adie   DVT prophylaxis: lovenox  Consultants: none  Code Status: full   Family Communication:  Patient   Disposition Plan: home tomorrow, if able to eat  More and orthostasis resolves  Procedures:  none  Antibiotics:  levaquin x1 at the cancer center on 11/1, Meropenem from admission to 11/3, levaquin from 11/3  Acyclovir  diflucan   Objective: BP (!) 108/58 (BP Location: Right Arm)   Pulse 70   Temp 98.2 F (36.8 C) (Oral)   Resp 16   SpO2 99%   Intake/Output Summary (Last 24 hours) at 12/05/2017 0849 Last data filed at 12/05/2017 0424 Gross per 24 hour  Intake 1280 ml  Output -  Net 1280 ml   There were no vitals filed for this visit.  Exam: Patient is examined daily including today on 12/05/2017, exams remain the same as of yesterday except that has changed    General:  Frail , pale, appear stronger today , dry oral mucosa, mucositis is improving  Cardiovascular: RRR, ectopic beats  Respiratory: CTABL  Abdomen: Soft/ND/NT, positive BS  Musculoskeletal: No Edema  Neuro: alert, oriented   Data Reviewed: Basic Metabolic Panel: Recent Labs  Lab 12/01/17 0929 12/03/17 1213 12/04/17 0556 12/05/17 0442  NA 139 136 139 140  K 4.3 4.6 4.2 3.9  CL 103 100 105 107  CO2 '27 26 26 27  '$ GLUCOSE 122* 103* 105* 116*  BUN  $'22 14 11 10  'V$ CREATININE 0.84 0.95 0.90 0.72  CALCIUM 9.8 9.9 9.2 8.4*  MG  --   --  1.6* 1.9   Liver Function Tests: Recent Labs  Lab 12/01/17 0929 12/03/17 1213  AST 61* 47*  ALT 61* 49*  ALKPHOS 122 126  BILITOT 1.1 0.9  PROT 6.2* 6.2*  ALBUMIN 2.9* 2.8*   No results for input(s): LIPASE, AMYLASE in the last 168 hours. No results for input(s): AMMONIA in the last 168  hours. CBC: Recent Labs  Lab 12/01/17 0929 12/03/17 1213 12/04/17 0556 12/05/17 0442  WBC 2.6* 3.4* 11.1* 21.9*  NEUTROABS 1.3* 0.8* 7.0 13.1*  HGB 13.6 13.5 12.7 12.1  HCT 40.7 40.9 39.5 38.0  MCV 90.4 91.1 92.3 91.8  PLT 187 217 204 216   Cardiac Enzymes:   No results for input(s): CKTOTAL, CKMB, CKMBINDEX, TROPONINI in the last 168 hours. BNP (last 3 results) No results for input(s): BNP in the last 8760 hours.  ProBNP (last 3 results) No results for input(s): PROBNP in the last 8760 hours.  CBG: Recent Labs  Lab 12/04/17 0930 12/04/17 1134 12/04/17 1653 12/04/17 2053 12/05/17 0758  GLUCAP 114* 198* 149* 108* 100*    Recent Results (from the past 240 hour(s))  Culture, Blood     Status: None (Preliminary result)   Collection Time: 12/03/17  2:11 PM  Result Value Ref Range Status   Specimen Description   Final    PORTA CATH Performed at [facility], [place] [address], [place], [place] [address]    Special Requests   Final    BOTTLES DRAWN AEROBIC AND ANAEROBIC Blood Culture adequate volume   Culture   Final    NO GROWTH 1 DAY Performed at [facility], [place] [address], [place], [place] [address]    Report Status PENDING  Incomplete  Urine Culture     Status: Abnormal   Collection Time: 12/03/17  2:11 PM  Result Value Ref Range Status   Specimen Description   Final    URINE, CLEAN CATCH Performed at [facility], [place] [address], [place], [place] [address]    Special Requests   Final    NONE Performed at [facility], [place] [address] [place], [place] [address]    Culture (A)  Final    <10,000 COLONIES/mL INSIGNIFICANT GROWTH Performed at [place] [address], [place], [place] [address]    Report Status 12/04/2017 FINAL  Final  Culture, Blood     Status: None (Preliminary result)   Collection Time: 12/03/17  2:14 PM  Result Value Ref Range Status   Specimen  Description   Final    BLOOD LEFT ARM Performed at [facility], [place] [address], [place], [place] [address]    Special Requests   Final    BOTTLES DRAWN AEROBIC AND ANAEROBIC Blood Culture adequate volume   Culture   Final    NO GROWTH 1 DAY Performed at [facility], [place] [address], [place], [place] [address]    Report Status PENDING  Incomplete     Studies: No results found.  Scheduled Meds: . acyclovir  400 mg Oral BID  . carvedilol  6.25 mg Oral BID WC  . cholecalciferol  1,000 Units Oral Daily  . enoxaparin (LOVENOX) injection  40 mg Subcutaneous Q24H  . feeding supplement (ENSURE ENLIVE)  237 mL Oral BID BM  .  fluconazole  100 mg Oral Daily  . insulin aspart  0-9 Units Subcutaneous TID WC  . letrozole  2.5 mg Oral Daily  . losartan  25 mg Oral Daily  . magic mouthwash w/lidocaine  10 mL Oral TID  . magnesium oxide  400 mg Oral Daily  . nystatin cream   Topical BID  . pantoprazole  40 mg Oral Daily  . sertraline  50 mg Oral Daily  . vitamin B-12  500 mcg Oral Daily    Continuous Infusions: . lactated ringers    . meropenem (MERREM) IV 1 g (12/05/17 0230)     Time spent: 66mns I have personally reviewed and interpreted on  12/05/2017 daily labs, imagings as discussed above under date review session and assessment and plans.  I reviewed all nursing notes, pharmacy notes,   vitals, pertinent old records  I have discussed plan of care as described above with RN , patient  on 12/05/2017   FFlorencia ReasonsMD, PhD  [facility] Pager [phone] If 7PM-7AM, please contact night-coverage at [email or web address], password [email or web address]11/04/2017, 8:49 AM  LOS: 2 days

## 2017-12-05 NOTE — Progress Notes (Signed)
These preliminary result these preliminary results were noted.  Awaiting final report.

## 2017-12-05 NOTE — Evaluation (Signed)
Physical Therapy Evaluation Patient Details Name: [name] MRN: [id number] DOB: 1940-10-31 Today's Date: 12/05/2017   History of Present Illness  Pt admitted from Cancer center with neutropenic fever and with hx of DM, DJD, neuropathy and Breast CA with mets s/p mastectomy  Clinical Impression  Pt admitted as above and presenting with functional mobility limitations 2* generalized weakness and ambulatory balance deficits.  Pt would benefit from follow up HHPT to further address deficits and use of RW for home.  This date pt BP 130/64 in sitting, 99/55 upon standing, and 102/54 after ambulating.    Follow Up Recommendations Home health PT    Equipment Recommendations  Rolling walker with 5" wheels    Recommendations for Other Services       Precautions / Restrictions Precautions Precautions: Fall Restrictions Weight Bearing Restrictions: No      Mobility  Bed Mobility               General bed mobility comments: Pt up in chair and requests back to same  Transfers Overall transfer level: Needs assistance Equipment used: None Transfers: Sit to/from Stand Sit to Stand: Min guard         General transfer comment: steady assist  Ambulation/Gait Ambulation/Gait assistance: Min assist;Min guard Gait Distance (Feet): 90 Feet(twice) Assistive device: Rolling walker (2 wheeled) Gait Pattern/deviations: Step-through pattern;Decreased step length - right;Decreased step length - left;Shuffle;Staggering left;Staggering right Gait velocity: decr   General Gait Details: Pt ambulated 11' with HHA for balance and additional 31' with RW,  cues for position from RW and marked improvement in stability  Stairs            Wheelchair Mobility    Modified Rankin (Stroke Patients Only)       Balance Overall balance assessment: Needs assistance Sitting-balance support: Feet supported;No upper extremity supported Sitting balance-Leahy Scale: Good      Standing balance support: No upper extremity supported Standing balance-Leahy Scale: Fair                               Pertinent Vitals/Pain Pain Assessment: No/denies pain    Home Living Family/patient expects to be discharged to:: Private residence Living Arrangements: Spouse/significant other;Children Available Help at Discharge: Family Type of Home: House Home Access: Stairs to enter Entrance Stairs-Rails: Right;Can reach Software engineer of Steps: 3 Home Layout: One level Home Equipment: None      Prior Function Level of Independence: Needs assistance   Gait / Transfers Assistance Needed: Pt reports hanging onto furniture to steady  ADL's / Homemaking Assistance Needed: Assist of spouse        Hand Dominance        Extremity/Trunk Assessment   Upper Extremity Assessment Upper Extremity Assessment: Generalized weakness    Lower Extremity Assessment Lower Extremity Assessment: Generalized weakness       Communication   Communication: No difficulties  Cognition Arousal/Alertness: Awake/alert Behavior During Therapy: WFL for tasks assessed/performed Overall Cognitive Status: Within Functional Limits for tasks assessed                                        General Comments      Exercises     Assessment/Plan    PT Assessment Patient needs continued PT services  PT Problem List Decreased strength;Decreased activity tolerance;Decreased balance;Decreased mobility;Decreased knowledge of  use of DME       PT Treatment Interventions DME instruction;Gait training;Stair training;Functional mobility training;Therapeutic activities;Therapeutic exercise;Patient/family education    PT Goals (Current goals can be found in the Care Plan section)  Acute Rehab PT Goals Patient Stated Goal: Regain strength and independence PT Goal Formulation: With patient Time For Goal Achievement: 12/19/17 Potential to Achieve  Goals: Good    Frequency Min 3X/week   Barriers to discharge        Co-evaluation               AM-PAC PT "6 Clicks" Daily Activity  Outcome Measure Difficulty turning over in bed (including adjusting bedclothes, sheets and blankets)?: A Little Difficulty moving from lying on back to sitting on the side of the bed? : A Little Difficulty sitting down on and standing up from a chair with arms (e.g., wheelchair, bedside commode, etc,.)?: A Little Help needed moving to and from a bed to chair (including a wheelchair)?: A Little Help needed walking in hospital room?: A Little Help needed climbing 3-5 steps with a railing? : A Little 6 Click Score: 18    End of Session   Activity Tolerance: Patient limited by fatigue Patient left: in chair;with call bell/phone within reach;with family/visitor present Nurse Communication: Mobility status PT Visit Diagnosis: Unsteadiness on feet (R26.81);Difficulty in walking, not elsewhere classified (R26.2);Muscle weakness (generalized) (M62.81)    Time: 2706-2376 PT Time Calculation (min) (ACUTE ONLY): 27 min   Charges:   PT Evaluation $PT Eval Low Complexity: 1 Low PT Treatments $Gait Training: 8-22 mins        Debe Coder PT Acute Rehabilitation Services Pager [phone] Office [phone]   Daneesha Quinteros 12/05/2017, 3:41 PM

## 2017-12-06 LAB — CBC WITH DIFFERENTIAL/PLATELET
ABS IMMATURE GRANULOCYTES: 4.2 10*3/uL — AB (ref 0.00–0.07)
BASOS ABS: 0.1 10*3/uL (ref 0.0–0.1)
Basophils Relative: 0 %
Eosinophils Absolute: 0.1 10*3/uL (ref 0.0–0.5)
Eosinophils Relative: 1 %
HCT: 37.8 % (ref 36.0–46.0)
Hemoglobin: 12.2 g/dL (ref 12.0–15.0)
IMMATURE GRANULOCYTES: 16 %
LYMPHS PCT: 10 %
Lymphs Abs: 2.7 10*3/uL (ref 0.7–4.0)
MCH: 29.8 pg (ref 26.0–34.0)
MCHC: 32.3 g/dL (ref 30.0–36.0)
MCV: 92.4 fL (ref 80.0–100.0)
Monocytes Absolute: 2.1 10*3/uL — ABNORMAL HIGH (ref 0.1–1.0)
Monocytes Relative: 8 %
NEUTROS ABS: 16.7 10*3/uL — AB (ref 1.7–7.7)
NEUTROS PCT: 65 %
NRBC: 0.7 % — AB (ref 0.0–0.2)
PLATELETS: 200 10*3/uL (ref 150–400)
RBC: 4.09 MIL/uL (ref 3.87–5.11)
RDW: 14.6 % (ref 11.5–15.5)
WBC: 25.8 10*3/uL — AB (ref 4.0–10.5)

## 2017-12-06 LAB — BASIC METABOLIC PANEL
ANION GAP: 8 (ref 5–15)
BUN: 8 mg/dL (ref 8–23)
CO2: 28 mmol/L (ref 22–32)
Calcium: 9.1 mg/dL (ref 8.9–10.3)
Chloride: 103 mmol/L (ref 98–111)
Creatinine, Ser: 0.74 mg/dL (ref 0.44–1.00)
GFR calc Af Amer: >60 mL/min (ref >60)
GLUCOSE: 128 mg/dL — AB (ref 70–99)
POTASSIUM: 4.1 mmol/L (ref 3.5–5.1)
Sodium: 139 mmol/L (ref 135–145)

## 2017-12-06 LAB — GLUCOSE, CAPILLARY
GLUCOSE-CAPILLARY: 115 mg/dL — AB (ref 70–99)
Glucose-Capillary: 204 mg/dL — ABNORMAL HIGH (ref 70–99)

## 2017-12-06 LAB — CORTISOL: CORTISOL PLASMA: 13.8 ug/dL

## 2017-12-06 LAB — MAGNESIUM: Magnesium: 1.8 mg/dL (ref 1.7–2.4)

## 2017-12-06 LAB — TSH: TSH: 1.914 u[IU]/mL (ref 0.350–4.500)

## 2017-12-06 MED ORDER — HEPARIN SOD (PORK) LOCK FLUSH 100 UNIT/ML IV SOLN
500.0000 [IU] | INTRAVENOUS | Status: AC | PRN
  Administered 2017-12-06: 500 [IU]
  Filled 2017-12-06: qty 5

## 2017-12-06 MED ORDER — SODIUM CHLORIDE 0.9 % IV BOLUS
500.0000 mL | Freq: Once | INTRAVENOUS | Status: AC
  Administered 2017-12-06: 500 mL via INTRAVENOUS

## 2017-12-06 MED ORDER — LEVOFLOXACIN 750 MG PO TABS: 750.0000 mg | ORAL_TABLET | Freq: Every day | ORAL | 0 refills | Status: AC

## 2017-12-06 MED ORDER — MAGIC MOUTHWASH W/LIDOCAINE: 5.0000 mL | Freq: Three times a day (TID) | ORAL | 0 refills | Status: DC | PRN

## 2017-12-06 MED ORDER — MAGNESIUM SULFATE 2 GM/50ML IV SOLN
2.0000 g | Freq: Once | INTRAVENOUS | Status: AC
  Administered 2017-12-06: 2 g via INTRAVENOUS
  Filled 2017-12-06: qty 50

## 2017-12-06 MED ORDER — SPIRONOLACTONE 25 MG PO TABS: 12.5000 mg | ORAL_TABLET | ORAL | 6 refills | Status: DC

## 2017-12-06 MED ORDER — MAGNESIUM OXIDE 400 MG PO TABS: 400.0000 mg | ORAL_TABLET | Freq: Every day | ORAL | 0 refills | Status: DC

## 2017-12-06 NOTE — Care Management (Signed)
Pt offered choice for home health services and [facility] chosen. RW requested and ordered. [facility] rep alerted of referral and DME need. Marney Doctor RN,BSN [phone]

## 2017-12-06 NOTE — Care Management Important Message (Signed)
Important Message  Patient Details  Name: [name] MRN: [id number] Date of Birth: [birth year]   Medicare Important Message Given:  Yes    Kerin Salen 12/06/2017, 11:29 [place] Message  Patient Details  Name: [name] MRN: [id number] Date of Birth: 31-Oct-1940   Medicare Important Message Given:  Yes    Kerin Salen 12/06/2017, 11:29 AM

## 2017-12-06 NOTE — Progress Notes (Signed)
Pt received 500 ml Bolus as ordered then rechecked the orthostatic vital signs. Dr. Erlinda Hong called and informed of results. Port flushed with 500 units of heparin prior to discharge. Discharge instructions explained to patient and her husband, prescription given to patient. Medications were explained to pt, she states understanding. Patient discharged to husband via wheelchair.

## 2017-12-06 NOTE — Discharge Summary (Signed)
Discharge Summary  [name] MPN:[id number] DOB: [birth year]  PCP: Vicenta Aly, FNP  Admit date: 12/03/2017 Discharge date: 12/06/2017  Time spent: 76mns, more than 50% time spent on coordination of care.  Recommendations for Outpatient Follow-up:  1. F/u with PCP within a week  for hospital discharge follow up, repeat cbc/bmp at follow up 2. F/u with oncology clinic, case discussed with Dr GLindi Adiewho will arrange follow up in a few days this week. 3. Home meds losartan held, spironolactone dose and frequency reduced, patient is to follow up with oncology clinic/pcp to further adjust bp meds/diuretics as now patient is prone to be dehydrated.   Discharge Diagnoses:  Active Hospital Problems   Diagnosis Date Noted  . [place]astatic [place] ([place] 12/03/2017  . [place]ropenic [place] ([facility] 12/03/2017  . Port-A-Cath in place 12/01/2017  . Chronic combined systolic and diastolic CHF (congestive heart failure) ([facility] 07/17/2015  . Neutropenia with fever ([facility] 06/25/2011  . DM type 2 (diabetes mellitus, type 2) ([facility] 08/10/2007    Resolved Hospital Problems  No resolved problems to display.    Discharge Condition: stable  Diet recommendation: heart healthy/carb modified  There were no vitals filed for this visit.  History of present illness:   PCP: AVicenta Aly FNP   Chief Complaint: sent from cancer center due to neutropenic fever  HPI: Hailey DANKOis a 77y.o. female   H/o DM2 on victoza,  H/o systolic chf  , last ef from 11/2017 was 30-35% ,  H/o met Breast cancer to the liver , she is sent from cancer center ( Dr GGeralyn Flashpatient) for neutropenic fever.  last chemo on 10/25, last neulasta on 10/28. patient reports fever of 100.5 at home, malaise, mouth sore, also concerning for yeast infection, cancer center sent blood culture, ua and urine culture, give one does of levequin and 500cc ivf, request admission, vital stable, admit to med surg.  She  denies chest pain, no sob, no edema, no cough,  no abdominal pain, no n/v, no diarrhea. Hospital Course:  Active Problems:   DM type 2 (diabetes mellitus, type 2) (HCC)   Neutropenia with fever (HCC)   Chronic combined systolic and diastolic CHF (congestive heart failure) (HCC)   Port-A-Cath in place   Metastatic breast cancer ([facility]   Neutropenic fever ([facility]   Neutropenic fever -LAST chemo on 10/25, neulasta on 10/28, anc on presentation is 800 -+ mucositis , states magic mouth wash helped , improving. Continue  Home meds acyclovir -vaginal yeast infection, continue home meds diflucan,report topical nysatinhelped -blood culture/urine culture no growth, chest x ray no acute findings -she is allergic to cephalosporin,  empirically started on merropenem, change to oral levaquin  -wbc recovered , she is discharged on oral levaquin to finish empical abx treatment.  Poor oral intake, orthostatic hypotension Received hydration, Encourage oral intake Home meds spironolactone held in the hospital, resumed at reduced dose and frequency.  PVC Denies chest pain  ekg sinus rhythm with pvc's Improved at discharge   Hypomagnesemia: Replaced, oral mag prescribed at discharge.   Chronic systolic chf: -LVEF has been around 35% at least since 2017 -continue coreg at home dose. -she is dehydrated on presentation, she received gentle hydration -home meds spironolactone, losartan held on admission,  -continue hold losartan, spironolactone dose and frequency reduced at discharge Close hospital discharge follow up  HTN:  continue coreg,  Home meds losartan/ spironolactone held in the hospital -continue hold losartan, spironolactone dose and frequency reduced at discharge  HLD; hold statin in the setting of abnormal liver function  Type II diabetes,controlled, she is not on insulin at home -home meds victoza held, continue to hold until oral intake adequate. 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 elevated lft ER 100%, PR 0%, HER-2 3+ positive S/p chemo (docetaxel) on 10/25 Continue home meds Letrozole F/u with oncology Dr Lindi Adie   DVT prophylaxis while in the hospital:lovenox  Consultants:none  Code Status:full   Family Communication:Patient   Disposition Plan:home   Procedures:  none  Antibiotics:  levaquin x1 at the cancer center on 11/1, Meropenem from admission to 11/3, levaquin from 11/3  Acyclovir  diflucan   Discharge Exam: BP 118/61 (BP Location: Right Arm)   Pulse 78   Temp 99.4 F (37.4 C) (Oral)   Resp 16   SpO2 94%   General: NAD, dry oral mucosa has resolved, mucositis has much improved, no new leasions Cardiovascular: RRR Respiratory: CTABL Abdomen: Soft/ND/NT, positive BS Musculoskeletal: No Edema Neuro: alert, oriented   Discharge Instructions You were cared for by a hospitalist during your hospital stay. If you have any questions about your discharge medications or the care you received while you were in the hospital after you are discharged, you can call the unit and asked to speak with the hospitalist on call if the hospitalist that took care of you is not available. Once you are discharged, your primary care physician will handle any further medical issues. Please note that NO REFILLS for any discharge medications will be authorized once you are discharged, as it is imperative that you return to your primary care physician (or establish a relationship with a primary care physician if you do not have one) for your aftercare needs so that they can reassess your need for medications and monitor your lab values.  Discharge Instructions    Diet general   Complete by:  As directed    Increase activity slowly   Complete by:  As directed      Allergies as of 12/06/2017      Reactions   Cephalexin Hives   Clindamycin Hives   Oxycodone-acetaminophen Hives,  Itching   Rocephin [ceftriaxone Sodium In Dextrose] Hives   Sulfonamide Derivatives Hives   Dilaudid [hydromorphone Hcl] Itching      Medication List    STOP taking these medications   atorvastatin 40 MG tablet Commonly known as:  LIPITOR   dexamethasone 4 MG tablet Commonly known as:  DECADRON   liraglutide 18 MG/3ML Sopn Commonly known as:  VICTOZA   LORazepam 0.5 MG tablet Commonly known as:  ATIVAN   losartan 100 MG tablet Commonly known as:  COZAAR   tolterodine 4 MG 24 hr capsule Commonly known as:  DETROL LA     TAKE these medications   acyclovir 200 MG capsule Commonly known as:  ZOVIRAX TAKE 2 CAPSULES BY MOUTH 3 TIMES DAILY What changed:  See the new instructions.   ALPRAZolam 0.5 MG tablet Commonly known as:  XANAX TAKE ONE-HALF TO ONE TABLET BY MOUTH THREE TIMES DAILY AS NEEDED FOR  NERVES What changed:    how much to take  how to take this  when to take this  reasons to take this  additional instructions   carvedilol 6.25 MG tablet Commonly known as:  COREG TAKE 1 TABLET TWICE DAILY WITH A MEAL What changed:  See the new instructions.   cholecalciferol 1000 units tablet Commonly known  as:  VITAMIN D Take 1 tablet (1,000 Units total) by mouth daily.   fluconazole 100 MG tablet Commonly known as:  DIFLUCAN Take 1 tablet (100 mg total) by mouth daily.   HYDROcodone-acetaminophen 10-325 MG tablet Commonly known as:  NORCO Take 1 tablet by mouth every 6 (six) hours as needed. What changed:    reasons to take this  Another medication with the same name was removed. Continue taking this medication, and follow the directions you see here.   Insulin Pen Needle 31G X 8 MM Misc 1 Device by Does not apply route 4 (four) times daily.   letrozole 2.5 MG tablet Commonly known as:  FEMARA TAKE ONE TABLET BY MOUTH DAILY. What changed:    how much to take  how to take this  when to take this  additional instructions   levofloxacin 750  MG tablet Commonly known as:  LEVAQUIN Take 1 tablet (750 mg total) by mouth daily for 2 days. Start taking on:  12/07/2017   lidocaine-prilocaine cream Commonly known as:  EMLA Apply to affected area once   magic mouthwash w/lidocaine Soln Take 5 mLs by mouth 3 (three) times daily as needed for mouth pain.   magnesium oxide 400 MG tablet Commonly known as:  MAG-OX Take 1 tablet (400 mg total) by mouth daily.   omeprazole 40 MG capsule Commonly known as:  PRILOSEC Take 40 mg by mouth daily.   ondansetron 8 MG tablet Commonly known as:  ZOFRAN Take 1 tablet (8 mg total) by mouth 2 (two) times daily as needed for refractory nausea / vomiting.   ONE-A-DAY WOMENS 50+ ADVANTAGE Tabs Take 1 tablet by mouth daily.   prochlorperazine 10 MG tablet Commonly known as:  COMPAZINE Take 1 tablet (10 mg total) by mouth every 6 (six) hours as needed (Nausea or vomiting).   sertraline 50 MG tablet Commonly known as:  ZOLOFT Take 1 tablet (50 mg total) by mouth daily.   spironolactone 25 MG tablet Commonly known as:  ALDACTONE Take 0.5 tablets (12.5 mg total) by mouth every Monday, Wednesday, and Friday. What changed:    how much to take  when to take this   vitamin B-12 500 MCG tablet Commonly known as:  CYANOCOBALAMIN Take 1 tablet (500 mcg total) by mouth daily.            Durable Medical Equipment  (From admission, onward)         Start     Ordered   12/06/17 0954  For home use only DME Walker rolling  Once    Question:  Patient needs a walker to treat with the following condition  Answer:  Weakness   12/06/17 0953         Allergies  Allergen Reactions  . Cephalexin Hives  . Clindamycin Hives  . Oxycodone-Acetaminophen Hives and Itching  . Rocephin [Ceftriaxone Sodium In Dextrose] Hives  . Sulfonamide Derivatives Hives  . Dilaudid [Hydromorphone Hcl] Itching   Follow-up Information    Vicenta Aly, FNP Follow up in 1 week(s).   Specialty:  Nurse  Practitioner Contact information: [address] [place] [address] [phone]        Nicholas Lose, MD Follow up in 1 week(s).   Specialty:  Hematology and Oncology Why:  hospital discharge follow up,  Contact information: [place] [id number] [place] Follow up.   Specialty:  Home  Health Services Why:  for home health services Contact information: [address] [place] [address] [phone]            The results of significant diagnostics from this hospitalization (including imaging, microbiology, ancillary and laboratory) are listed below for reference.    Significant Diagnostic Studies: US Biopsy (liver)  Result Date: 11/12/2017 INDICATION: 77 year old female with a history of breast cancer and new multifocal hepatic metastatic disease. She presents for ultrasound-guided biopsy of 1 of the hepatic lesions to confirm tissue diagnosis. EXAM: Ultrasound-guided core biopsy, liver MEDICATIONS: None. ANESTHESIA/SEDATION: Moderate (conscious) sedation was employed during this procedure. A total of Versed 2 mg and Fentanyl 50 mcg was administered intravenously. Moderate Sedation Time: 11 minutes. The patient's level of consciousness and vital signs were monitored continuously by radiology nursing throughout the procedure under my direct supervision. FLUOROSCOPY TIME:  Fluoroscopy Time: 0 minutes 0 seconds (0 mGy). COMPLICATIONS: None immediate. PROCEDURE: Informed written consent was obtained from the patient after a thorough discussion of the procedural risks, benefits and alternatives. All questions were addressed. A timeout was performed prior to the initiation of the procedure. Ultrasound was used to interrogate the right upper quadrant. There are numerous hypoechoic masses scattered throughout the liver. A suitable lesion in the inferior aspect of hepatic segment 6  measuring 2.9 x 2.4 cm was selected. The overlying skin was marked and sterilely prepped and draped in standard fashion using chlorhexidine skin prep. Local anesthesia was attained by infiltration with 1% lidocaine. A small dermatotomy was made. Under real-time sonographic guidance, a 17 gauge introducer needle was carefully advanced into the lesion. Multiple 18 gauge core biopsies were then coaxially obtained using the bio Pince automated biopsy device. Biopsy specimens were placed in formalin and delivered to pathology for further analysis. As the introducer needle was removed, the biopsy tract was embolized with a Gel-Foam slurry. Post biopsy ultrasound imaging demonstrates no evidence of immediate complication. The patient tolerated the procedure well. IMPRESSION: Ultrasound-guided core biopsy of liver lesion. Electronically Signed   By: Jacqulynn Cadet M.D.   On: 11/12/2017 13:55   Dg Chest Port 1 View  Result Date: 12/03/2017 CLINICAL DATA:  Fever.  History breast cancer EXAM: PORTABLE CHEST 1 VIEW COMPARISON:  05/27/2015 FINDINGS: Right jugular Port-A-Cath tip in the mid SVC. Surgical clips left axilla Heart size and vascularity normal. Lungs are clear. Negative for pneumonia. No effusion IMPRESSION: No active disease. Electronically Signed   By: Franchot Gallo M.D.   On: 12/03/2017 18:37   Dg Fluoro Guide Cv Line Right  Result Date: 11/25/2017 CLINICAL DATA:  Surgically placed port a catheter. EXAM: CENTRAL VENOUS CATHETER WITH FLUOROSCOPY FLUOROSCOPY TIME:  22 seconds COMPARISON:  PET-CT-12/02/2017 FINDINGS: A single spot intraoperative fluoroscopic images of the upper chest is provided for review. Provided image demonstrates a presumed right internal jugular approach port a catheter with tip overlying the superior cavoatrial junction. No definite pneumothorax. There is rightward deviation the tracheal air column at the level of the thoracic inlet potentially accentuated due to obliquity.  Radiopaque surgical support apparatus is seen about the inferior medial aspect of chest, presumably external to the patient. IMPRESSION: Post surgically placed right jugular approach port a catheter. Electronically Signed   By: Sandi Mariscal M.D.   On: 11/25/2017 08:25    Microbiology: Recent Results (from the past 240 hour(s))  Culture, Blood     Status: None (Preliminary result)   Collection Time: 12/03/17  2:11 PM  Result Value Ref Range Status  Specimen Description   Final    PORTA CATH Performed at [facility], [place] [address], [place], [place] [address]    Special Requests   Final    BOTTLES DRAWN AEROBIC AND ANAEROBIC Blood Culture adequate volume   Culture   Final    NO GROWTH 2 DAYS Performed at [facility], [place] [address], [place], [place] [address]    Report Status PENDING  Incomplete  Urine Culture     Status: Abnormal   Collection Time: 12/03/17  2:11 PM  Result Value Ref Range Status   Specimen Description   Final    URINE, CLEAN CATCH Performed at [facility], [place] [address] [place], [place] [address]    Special Requests   Final    NONE Performed at [facility], [place] [address] [place], [place] [address]    Culture (A)  Final    <10,000 COLONIES/mL INSIGNIFICANT GROWTH Performed at [facility] [address] [place], [place] [address]    Report Status 12/04/2017 FINAL  Final  Culture, Blood     Status: None (Preliminary result)   Collection Time: 12/03/17  2:14 PM  Result Value Ref Range Status   Specimen Description   Final    BLOOD LEFT ARM Performed at [facility], [place] [address], [place], [place] [address]    Special Requests   Final    BOTTLES DRAWN AEROBIC AND ANAEROBIC Blood Culture adequate volume   Culture   Final    NO GROWTH 2 DAYS Performed at [facility], [place] [address], [place], [place] [address]    Report Status  PENDING  Incomplete     Labs: Basic Metabolic Panel: Recent Labs  Lab 12/01/17 0929 12/03/17 1213 12/04/17 0556 12/05/17 0442 12/06/17 0611  NA 139 136 139 140 139  K 4.3 4.6 4.2 3.9 4.1  CL 103 100 105 107 103  CO2 '27 26 26 27 28  '$ GLUCOSE 122* 103* 105* 116* 128*  BUN '22 14 11 10 8  '$ CREATININE 0.84 0.95 0.90 0.72 0.74  CALCIUM 9.8 9.9 9.2 8.4* 9.1  MG  --   --  1.6* 1.9 1.8   Liver Function Tests: Recent Labs  Lab 12/01/17 0929 12/03/17 1213  AST 61* 47*  ALT 61* 49*  ALKPHOS 122 126  BILITOT 1.1 0.9  PROT 6.2* 6.2*  ALBUMIN 2.9* 2.8*   No results for input(s): LIPASE, AMYLASE in the last 168 hours. No results for input(s): AMMONIA in the last 168 hours. CBC: Recent Labs  Lab 12/01/17 0929 12/03/17 1213 12/04/17 0556 12/05/17 0442 12/06/17 0611  WBC 2.6* 3.4* 11.1* 21.9* 25.8*  NEUTROABS 1.3* 0.8* 7.0 13.1* 16.7*  HGB 13.6 13.5 12.7 12.1 12.2  HCT 40.7 40.9 39.5 38.0 37.8  MCV 90.4 91.1 92.3 91.8 92.4  PLT 187 217 204 216 200   Cardiac Enzymes: No results for input(s): CKTOTAL, CKMB, CKMBINDEX, TROPONINI in the last 168 hours. BNP: BNP (last 3 results) No results for input(s): BNP in the last 8760 hours.  ProBNP (last 3 results) No results for input(s): PROBNP in the last 8760 hours.  CBG: Recent Labs  Lab 12/05/17 0758 12/05/17 1135 12/05/17 1732 12/05/17 2118 12/06/17 0758  GLUCAP 100* 190* 179* 165* 115*       Signed:  Florencia Reasons MD, PhD  [facility] 12/06/2017, 11:15 AM

## 2017-12-07 ENCOUNTER — Telehealth: Payer: Self-pay | Admitting: Hematology and Oncology

## 2017-12-07 NOTE — Telephone Encounter (Signed)
Scheduled appt per 1/15 sch message - pt is aware of appt date and time   

## 2017-12-08 LAB — CULTURE, BLOOD (SINGLE)
Culture: NO GROWTH
Culture: NO GROWTH
SPECIAL REQUESTS: ADEQUATE
SPECIAL REQUESTS: ADEQUATE

## 2017-12-09 ENCOUNTER — Encounter: Payer: Self-pay | Admitting: Physician Assistant

## 2017-12-09 ENCOUNTER — Ambulatory Visit: Payer: Medicare HMO | Admitting: Physician Assistant

## 2017-12-09 VITALS — BP 106/56 | HR 58 | Ht 68.0 in | Wt 164.8 lb

## 2017-12-09 DIAGNOSIS — I428 Other cardiomyopathies: Secondary | ICD-10-CM

## 2017-12-09 DIAGNOSIS — I493 Ventricular premature depolarization: Secondary | ICD-10-CM | POA: Diagnosis not present

## 2017-12-09 DIAGNOSIS — I491 Atrial premature depolarization: Secondary | ICD-10-CM

## 2017-12-09 DIAGNOSIS — I5042 Chronic combined systolic (congestive) and diastolic (congestive) heart failure: Secondary | ICD-10-CM | POA: Diagnosis not present

## 2017-12-09 DIAGNOSIS — I10 Essential (primary) hypertension: Secondary | ICD-10-CM

## 2017-12-09 DIAGNOSIS — C50312 Malignant neoplasm of lower-inner quadrant of left female breast: Secondary | ICD-10-CM

## 2017-12-09 NOTE — Progress Notes (Signed)
Cardiology Office Note    Date:  12/09/2017   ID:  CLARINE ELROD, DOB 10/02/1940, MRN [id number]  PCP:  Vicenta Aly, [place]  Cardiologist:  Dr. Radford Pax   Chief Complaint: Hospital follow up   History of Present Illness:   [name] is a 77 y.o. female with history of HTN, HLD, DM, metastatic breast cancer to liver, NICM/chronic combined CHF, anxiety, asthma, baseline tremor and palpitations presents for follow up.  She was diagnosed with CHF in 2017 at which time nuc was abnormal and 2D echo 05/28/15 showed mild LVH, EF 35-40%, grade 1 DD, moderate focal AV calcification. LHC 05/29/15 showed normal coronaries, EF 30-40%, normal LVEDP, etiology of CM uncertain but could be related to late effect of chemo 2/2 breast CA. Also hx of palpitations. Follow up event monitor showed NSR with average HR 71, frequent PACs, occasional PVCs, and sinus bradycardia as low as 50. Mg level was 1.8, prompting rx of MagOx 400mg  daily.  Previously dyspnea improved with d/c of amlodipine and titration of carvedilol.  She was doing well when last seen by Melina Copa 09/16/16.   Recently admitted 12/2017 from cancer center due to neutropenic fever. last chemo on 10/25, last neulasta on 10/28. Found to have vaginal yeast infection. Treated empirically. Hydrated for hypotension. Stopped losartan and reduce spironolactone. ast echo 11/24/17 showed LVEF of 30-35% (stable from 35-40% in  2017) with diffuse hypokines and grade 1 DD. The findings indicate significant septal-lateral left ventricular wall dyssynchrony due to conduction delay.  Here today for follow up. Feeling weak and dyspnic. Does feels intermittent palpitations. No chest pain, dizziness, orthopnea, PND, syncope or LE edema. Low appetite. BP continues to be soft low on BB and low dose spironolactone. Losartan on hold.   Past Medical History:  Diagnosis Date  . ALLERGIC RHINITIS 12/13/2007  . ANXIETY 06/04/2008  . Blood transfusion 1964   post childbirth  . 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 Chronic combined systolic (congestive) and diastolic (congestive) heart failure ([place])   . DEGENERATIVE JOINT DISEASE 12/17/2006  . DEPRESSION 08/10/2007  . Dilated cardiomyopathy ([place]) 05/29/2015   a. Dx 05/2015 - EF 35-40% - felt secondary to XRT and chemo for breast CA. Normal coronaries by cath.  . DM type 2 (diabetes mellitus, type 2) ([place])   . Dyspnea    sometimes when walking , sometimes when sitting  . Essential hypertension   . GERD 12/17/2006  . History of blood transfusion    after child birth  . History of chemotherapy   . History of hiatal hernia   . History of radiation therapy 10/07/11-11/24/11   left breast,5040 cGy/28 sessions,l chest wall boost=1000cGy/5 sesions  . 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 Hyperlipidemia   . 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 OBESITY 06/04/2008  . 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 Pneumonia   . Premature atrial contractions   . Sinus bradycardia    a. 2018 event monitor which showed NSR with average HR 71, frequent PACs, occasional PVCs, and sinus bradycardia as low as 50.    Past Surgical History:  Procedure Laterality Date  . bladder tack  1974  . BREAST EXCISIONAL BIOPSY  11/10/1988   left benign Dr Margot Chimes  . BREAST EXCISIONAL BIOPSY  03/04/1983   Right - two areas - Dr Margot Chimes  . BREAST EXCISIONAL BIOPSY  10/09/1980   right - Dr  Streck  . BREAST EXCISIONAL BIOPSY  10/18/1979   left - Dr Margot Chimes  . BREAST EXCISIONAL BIOPSY  01/02/1994   right - Dr Margot Chimes  . BREAST EXCISIONAL BIOPSY  2017   right  . BREAST SURGERY    . CARDIAC CATHETERIZATION N/A 05/29/2015   Procedure: Left Heart Cath and Coronary Angiography;  Surgeon: Belva Crome, MD;  Location: [facility];  Service: Cardiovascular;  Laterality: N/A;  . CHOLECYSTECTOMY  02/23/1991   LC - Dr Margot Chimes  . COLONOSCOPY    . ENTEROCELE REPAIR  06/08   Dr. Cletis Media  . EVACUATION  BREAST HEMATOMA  07/01/2011   Procedure: EVACUATION HEMATOMA BREAST;  Surgeon: Haywood Lasso, MD;  Location: [facility];  Service: General;  Laterality: Left;  Drainage of left mastectomy seroma  . EYE SURGERY Right    catartact  . HIP SURGERY Right 08/27/2010   DR. Maureen Ralphs-  "burcitis removal"  . LAMINECTOMY  1993   s/p-Dr. Rita Ohara  . MASTECTOMY Left 03/23/2011  . MODIFIED RADICAL MASTECTOMY W/ AXILLARY LYMPH NODE DISSECTION Left 03-30-11  . PORT-A-CATH REMOVAL  12/02/2011   Procedure: REMOVAL PORT-A-CATH;  Surgeon: Haywood Lasso, MD;  Location: [facility];  Service: General;  Laterality: N/A;  . PORTACATH PLACEMENT  04/13/2011   Procedure: INSERTION PORT-A-CATH;  Surgeon: Haywood Lasso, MD;  Location: [facility];  Service: General;  Laterality: N/A;  porta cath placement,removal of J-P drain  . PORTACATH PLACEMENT Right 11/25/2017   Procedure: INSERTION PORT-A-CATH WITH [place];  Surgeon: Rolm Bookbinder, MD;  Location: [facility];  Service: General;  Laterality: Right;  . RADIOACTIVE SEED GUIDED EXCISIONAL BREAST BIOPSY Right 08/09/2015   Procedure: RIGHT RADIOACTIVE SEED GUIDED EXCISIONAL BREAST BIOPSY, RIGHT NIPPLE LESION EXCISION;  Surgeon: Rolm Bookbinder, MD;  Location: [facility];  Service: General;  Laterality: Right;  . ROTATOR CUFF REPAIR  8/03   right, Dr. Tonita Cong  . ROTATOR CUFF REPAIR  10/26/2008   left, Dr. Rush Farmer  . TONSILLECTOMY    . VAGINAL HYSTERECTOMY  1974   partial     Current Medications: Prior to Admission medications   Medication Sig Start Date End Date Taking? Authorizing Provider  acyclovir (ZOVIRAX) 200 MG capsule TAKE 2 CAPSULES BY MOUTH 3 TIMES DAILY Patient taking differently: Take 400 mg by mouth 2 (two) times daily.  10/23/13   Noralee Space, MD  ALPRAZolam Duanne Moron) 0.5 MG tablet TAKE ONE-HALF TO ONE TABLET BY MOUTH THREE TIMES DAILY AS NEEDED FOR  NERVES Patient taking differently: Take 0.25-0.5 mg by  mouth 3 (three) times daily as needed for anxiety.  02/23/13   Noralee Space, MD  carvedilol (COREG) 6.25 MG tablet TAKE 1 TABLET TWICE DAILY WITH A MEAL Patient taking differently: Take 6.25 mg by mouth daily.  05/27/17   Sueanne Margarita, MD  cholecalciferol (VITAMIN D) 1000 UNITS tablet Take 1 tablet (1,000 Units total) by mouth daily. 02/23/13   Noralee Space, MD  cyanocobalamin 500 MCG tablet Take 1 tablet (500 mcg total) by mouth daily. 02/23/13   Noralee Space, MD  fluconazole (DIFLUCAN) 100 MG tablet Take 1 tablet (100 mg total) by mouth daily. 12/01/17   Nicholas Lose, MD  HYDROcodone-acetaminophen (NORCO) 10-325 MG tablet Take 1 tablet by mouth every 6 (six) hours as needed. Patient taking differently: Take 1 tablet by mouth every 6 (six) hours as needed for moderate pain.  11/25/17 11/25/18  Rolm Bookbinder, MD  Insulin Pen Needle (  RELION PEN NEEDLE 31G/8MM) 31G X 8 MM MISC 1 Device by Does not apply route 4 (four) times daily. 10/05/12   Renato Shin, MD  letrozole [facility]) 2.5 MG tablet TAKE ONE TABLET BY MOUTH DAILY. Patient taking differently: Take 2.5 mg by mouth daily.  04/29/17   Nicholas Lose, MD  levofloxacin (LEVAQUIN) 750 MG tablet Take 1 tablet (750 mg total) by mouth daily for 2 days. 12/07/17 12/09/17  Florencia Reasons, MD  lidocaine-prilocaine (EMLA) cream Apply to affected area once 11/19/17   Nicholas Lose, MD  magic mouthwash w/lidocaine SOLN Take 5 mLs by mouth 3 (three) times daily as needed for mouth pain. 12/06/17   Florencia Reasons, MD  magnesium oxide (MAG-OX) 400 MG tablet Take 1 tablet (400 mg total) by mouth daily. 12/06/17   Florencia Reasons, MD  Multiple Vitamins-Minerals (ONE-A-DAY WOMENS 50+ ADVANTAGE) TABS Take 1 tablet by mouth daily. 02/23/13   Noralee Space, MD  omeprazole (PRILOSEC) 40 MG capsule Take 40 mg by mouth daily.    [provider]  ondansetron (ZOFRAN) 8 MG tablet Take 1 tablet (8 mg total) by mouth 2 (two) times daily as needed for refractory nausea / vomiting.  11/19/17   Nicholas Lose, MD  prochlorperazine (COMPAZINE) 10 MG tablet Take 1 tablet (10 mg total) by mouth every 6 (six) hours as needed (Nausea or vomiting). 11/19/17   Nicholas Lose, MD  sertraline (ZOLOFT) 50 MG tablet Take 1 tablet (50 mg total) by mouth daily. 02/23/13   Noralee Space, MD  spironolactone (ALDACTONE) 25 MG tablet Take 0.5 tablets (12.5 mg total) by mouth every Monday, Wednesday, and Friday. 12/06/17   Florencia Reasons, MD    Allergies:   Cephalexin; Clindamycin; Oxycodone-acetaminophen; Rocephin [ceftriaxone sodium in dextrose]; Sulfonamide derivatives; and Dilaudid [hydromorphone hcl]   Social History   Socioeconomic History  . Marital status: Married    Spouse name: Not on file  . Number of children: 3  . Years of education: Not on file  . Highest education level: Not on file  Occupational History    Employer: RETIRED  Social Needs  . Financial resource strain: Not on file  . Food insecurity:    Worry: Not on file    Inability: Not on file  . Transportation needs:    Medical: Not on file    Non-medical: Not on file  Tobacco Use  . Smoking status: Never Smoker  . Smokeless tobacco: Never Used  Substance and Sexual Activity  . Alcohol use: No  . Drug use: No  . Sexual activity: Yes  Lifestyle  . Physical activity:    Days per week: Not on file    Minutes per session: Not on file  . Stress: Not on file  Relationships  . Social connections:    Talks on phone: Not on file    Gets together: Not on file    Attends religious service: Not on file    Active member of club or organization: Not on file    Attends meetings of clubs or organizations: Not on file    Relationship status: Not on file  Other Topics Concern  . 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   ROS:   Please see the history of present illness.    ROS All other systems reviewed and are negative.   PHYSICAL EXAM:   VS:  BP (!) 106/56   Pulse (!) 58   Ht 5\' 8"  (1.727 m)   Wt 164 lb 12.8 oz (74.8 kg)   SpO2 94%   BMI 25.06 kg/m    GEN: Well nourished, well developed, in no acute distress  HEENT: normal  Neck: no JVD, carotid bruits, or masses Cardiac: RRR; no murmurs, rubs, or gallops,no edema  Respiratory:  clear to auscultation bilaterally, normal work of breathing GI: soft, nontender, nondistended, + BS MS: no deformity or atrophy  Skin: warm and dry, no rash Neuro:  Alert and Oriented x 3, Strength and sensation are intact Psych: euthymic mood, full affect  Wt Readings from Last 3 Encounters:  12/09/17 164 lb 12.8 oz (74.8 kg)  12/03/17 167 lb 8 oz (76 kg)  12/01/17 165 lb 9.6 oz (75.1 kg)      Studies/Labs Reviewed:   EKG:  EKG is not ordered today.    Recent Labs: 12/03/2017: ALT 49 12/06/2017: BUN 8; Creatinine, Ser 0.74; Hemoglobin 12.2; Magnesium 1.8; Platelets 200; Potassium 4.1; Sodium 139; TSH 1.914   Lipid Panel    Component Value Date/Time   CHOL 149 05/30/2015 0535   TRIG 94 05/30/2015 0535   HDL 35 (L) 05/30/2015 0535   CHOLHDL 4.3 05/30/2015 0535   VLDL 19 05/30/2015 0535   LDLCALC 95 05/30/2015 0535   LDLDIRECT 212.7 02/06/2011 1018    Additional studies/ records that were reviewed today include:   Echocardiogram: 11/24/17 Study Conclusions  - Left ventricle: Average longitudinal LV strain is abnormal at   -11% The cavity size was normal. There was mild concentric   hypertrophy. Systolic function was moderately to severely   reduced. The estimated ejection fraction was in the range of 30%   to 35%.  Moderate diffuse hypokinesis. There is akinesis of the   inferolateral and inferior myocardium. There was an increased   relative contribution of atrial contraction to ventricular   filling. Doppler parameters are consistent with abnormal left   ventricular relaxation (grade 1 diastolic dysfunction). - Aortic valve: Trileaflet; mildly thickened, mildly calcified   leaflets. - Mitral valve: Valve area by pressure half-time: 1.98 cm^2. - Pulmonic valve: There was mild regurgitation. - Impressions: The findings indicate significant septal-lateral   left ventricular wall dyssynchrony due to conduction delay  Impressions:  - The findings indicate significant septal-lateral left ventricular   wall dyssynchrony due to conduction delay  Left Heart Cath and Coronary Angiography  05/2015  Conclusion    Global reduction in LV function with estimated EF in the 30-40% range. Normal LV end-diastolic pressure. Etiology uncertain but could be related to late effect of chemotherapeutic agents used to manage breast cancer.  Left dominant coronary system. Normal coronary arteries.  RECOMMENDATIONS:   Therapy for heart failure     ASSESSMENT & PLAN:    1. Chronic systolic and diastolic CHF/NICM - Last echo 11/24/17 showed LVEF of 30-35% (stable from 35-40% in  2017) with diffuse hypokines and grade 1 DD. The findings indicate significant septal-lateral left ventricular wall dyssynchrony due to conduction delay. Prior cath in 2017 showed normal coronaries. Hx of  PVCs without arrhythmias on prior evaluation. Recent EKG with PVCs. Reviewed with Dr. Turner>> get 24 hours monitor to quantify PVCs burden (this might be cause of low EF with ? Chemo radiation). - Euvolemic. Continue Coreg at current dose of 6.25mg  BID and spironolactone 12.5mg  qd. BP soft to restart losartan.    2. HTN - as above. No change.   3. Breast cancer with liver metastasis by biopsy 12/18/17 - Last chemo on 10/25, last  neulasta on 10/28. Systemic chemotherapy TaxotereHerceptin and Perjeta held due to low LVEF. Dr. Radford Pax recommended CHF clinic evaluation prior to restart.   Medication Adjustments/Labs and Tests Ordered: Current medicines are reviewed at length with the patient today.  Concerns regarding medicines are outlined above.  Medication changes, Labs and Tests ordered today are listed in the Patient Instructions below. Patient Instructions  Medication Instructions:  Your physician recommends that you continue on your current medications as directed. Please refer to the Current Medication list given to you today.  If you need a refill on your cardiac medications before your next appointment, please call your pharmacy.   Lab work: NONE ORDERED  If you have labs (blood work) drawn today and your tests are completely normal, you will receive your results only by: Marland Kitchen MyChart Message (if you have MyChart) OR . A paper copy in the mail If you have any lab test that is abnormal or we need to change your treatment, we will call you to review the results.  Testing/Procedures: Your physician has recommended that you wear a 24 HOUR holter monitor. Holter monitors are medical devices that record the heart's electrical activity. Doctors most often use these monitors to diagnose arrhythmias. Arrhythmias are problems with the speed or rhythm of the heartbeat. The monitor is a small, portable device. You can wear one while you do your normal daily activities. This is usually used to diagnose what is causing palpitations/syncope (passing out).    Follow-Up: At [facility], you and your health needs are our priority.  As part of our continuing mission to provide you with exceptional heart care, we have created designated Provider Care Teams.  These Care Teams include your primary Cardiologist (physician) and Advanced Practice Providers (APPs -  Physician Assistants and Nurse Practitioners) who all work together to  provide you with the care you need, when you need it. You will need a follow up appointment in 3 months.  Please call our office 2 months in advance to schedule this appointment.  You may see No primary care provider on file. or one of the following Advanced Practice Providers on your designated Care Team:   Lyda Jester, PA-C Melina Copa, PA-C . Ermalinda Barrios, PA-C  Any Other Special Instructions Will Be Listed Below (If Applicable).       Jarrett Soho, [place]  12/09/2017 2:40 PM    [facility] [address], [place], [place]  [address] Phone: [phone]; Fax: [phone]

## 2017-12-09 NOTE — Patient Instructions (Addendum)
Medication Instructions:  Your physician recommends that you continue on your current medications as directed. Please refer to the Current Medication list given to you today.  If you need a refill on your cardiac medications before your next appointment, please call your pharmacy.   Lab work: NONE ORDERED  If you have labs (blood work) drawn today and your tests are completely normal, you will receive your results only by: Marland Kitchen MyChart Message (if you have MyChart) OR . A paper copy in the mail If you have any lab test that is abnormal or we need to change your treatment, we will call you to review the results.  Testing/Procedures: Your physician has recommended that you wear a 24 HOUR holter monitor. Holter monitors are medical devices that record the heart's electrical activity. Doctors most often use these monitors to diagnose arrhythmias. Arrhythmias are problems with the speed or rhythm of the heartbeat. The monitor is a small, portable device. You can wear one while you do your normal daily activities. This is usually used to diagnose what is causing palpitations/syncope (passing out).    Follow-Up: At [facility], you and your health needs are our priority.  As part of our continuing mission to provide you with exceptional heart care, we have created designated Provider Care Teams.  These Care Teams include your primary Cardiologist (physician) and Advanced Practice Providers (APPs -  Physician Assistants and Nurse Practitioners) who all work together to provide you with the care you need, when you need it. You will need a follow up appointment in 3 months. You may see DR. TRACI TURNER or one of the following Advanced Practice Providers on your designated Care Team:   [place], PA-C Dayna Dunn, PA-C . Ermalinda Barrios, PA-C   YOU ARE BEING REFERRED TO HEART FAILURE CLINIC; DX NICM; APPT CAN BE WITH WHICHEVER PROVIDER CAN SEE HER FIRST. ASAP PER VIN BHAGAT, PAC   Any Other  Special Instructions Will Be Listed Below (If Applicable).

## 2017-12-10 ENCOUNTER — Other Ambulatory Visit: Payer: Medicare HMO

## 2017-12-10 ENCOUNTER — Ambulatory Visit: Payer: Medicare HMO

## 2017-12-10 ENCOUNTER — Ambulatory Visit (INDEPENDENT_AMBULATORY_CARE_PROVIDER_SITE_OTHER): Payer: Medicare HMO

## 2017-12-10 DIAGNOSIS — I491 Atrial premature depolarization: Secondary | ICD-10-CM | POA: Diagnosis not present

## 2017-12-10 DIAGNOSIS — I493 Ventricular premature depolarization: Secondary | ICD-10-CM

## 2017-12-12 NOTE — Progress Notes (Signed)
[facility] Follow up:    [name], [address] [place] [address]   DIAGNOSIS: Cancer Staging Primary cancer of lower-inner quadrant of left female breast [facility]) Staging form: Breast, AJCC 7th Edition - Clinical: Stage IIB (T3, N0, cM0) - Signed by Haywood Lasso, MD on 03/05/2011 Prognostic indicators: ER 100%  PR 100%  Ki67 18%  Her2 neg 1.31    - Pathologic: Stage IIIA (T3, N2a, cM0) - Signed by Haywood Lasso, MD on 03/25/2011 Prognostic indicators: ER 100%  PR 100%  Ki67 18%  Her2 neg 1.31      SUMMARY OF ONCOLOGIC HISTORY:   Primary cancer of lower-inner quadrant of left female breast ([place])   02/20/2011 Initial Diagnosis    Left Breast invasive lobular carcinoma; 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 Her clinical staging was stage IIB (T3, N0, M0).      03/23/2011 Surgery    L mastectomy + Axillary LND 6 cm ILC with LVI, PNI; 9/16 LN positive with Extracapsular ext ER 100%; PR 100%; Her 2 1.31; Ki 67: 18; pT3a N2a (stage IIIA)    04/17/2011 - 06/19/2011 Chemotherapy    FEC X 4 complicated by febrile neutropenis, spesis and chest wall abscess    10/07/2011 - 11/24/2011 Radiation Therapy    Left chest wall and axilla 5040 cGy in 28 # with Boost    10/16/2011 -  Anti-estrogen oral therapy    Letrozole 2.5 mg po daily    05/27/2015 - 05/30/2015 Hospital Admission    Acute systolic CHF, dilated cardiomyopathy, EF 35-40%    08/09/2015 Surgery    Right lumpectomy: Fibrocystic changes with calcifications, right nipple lesion: Seborrheic keratosis no evidence of malignancy    10/20/2017 Imaging    Elevated LFTs letter ultrasound of the liver which showed liver metastases    11/04/2017 PET scan    Heavy burden of metastatic disease to the liver involving all lobes, index 7.7 centimeters right hepatic lobe mass SUV 16.6, small but hypermetabolic left supraclavicular, left  subpectoral, portacaval, aortocaval adenopathy    11/12/2017 Pathology Results    Liver biopsy: Metastatic breast cancer, ER 100%, PR 0%, Ki-67 10%, HER-2 3+ positive    11/19/2017 -  Chemotherapy    Taxotere, Herceptin, Perjeta, (herceptin and perjeta on hold due to decreased ejection fraction)     CURRENT THERAPY: Taxotere  INTERVAL HISTORY: [name] 77 y.o. female returns for evaluation after her recent hospitalization.  She was seen by Sandi Mealy on 12/03/2017 for febrile neutropenia.  She was admitted on 12/03/2017.  She was placed on IV fluids, received IV meropenem (changed to oral levaquin), and her blood pressure medications were adjusted.  She was discharged about 1 week ago. She has finished the course of oral levaquin that was prescribed.   Her activity level is slowly increasing, as well as her appetite.  Her blood pressure is up today from previous.  Her heart rate is slightly decreased today as well.  Her weight says 164 (with her winter clothes), she notes that due to her heart she weighed herself daily and was normally 165-166 at home (before hospital admission), and this weekend she was 158.  She does have occasional PVCs, and she notes intermittent palpitations.  Some days are worse than others.      Patient Active Problem List   Diagnosis Date Noted  . Metastatic breast cancer ([place]) 12/03/2017  . Neutropenic fever ([place]) 12/03/2017  .  Port-A-Cath in place 12/01/2017  . PVC's (premature ventricular contractions) 05/28/2016  . NICM (nonischemic cardiomyopathy) ([place]) 05/28/2016  . Premature atrial contractions 04/21/2016  . Chronic combined systolic and diastolic CHF (congestive heart failure) ([place]) 07/17/2015  . Dilated cardiomyopathy ([place]) 05/29/2015  . Recurrent major depressive disorder ([place]) 02/28/2015  . Acute bronchitis 12/19/2014  . Acid reflux 06/20/2014  . Herpes 06/20/2014  . 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 H/O malignant neoplasm of  breast 03/15/2013  . HLD (hyperlipidemia) 03/15/2013  . Long term current use of insulin ([place]) 03/15/2013  . 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 Controlled type 2 diabetes mellitus without complication ([place]) 44/31/5400  . Mixed incontinence 03/15/2013  . Macular degeneration 03/02/2013  . LBP (low back pain) 02/23/2013  . Neutropenia with fever ([place]) 06/25/2011  . Open wound of left breast 05/22/2011  . Primary cancer of lower-inner quadrant of left female breast ([place]) 02/20/2011  . DM type 2 (diabetes mellitus, type 2) ([place]) 08/10/2007  . DEPRESSION 08/10/2007  . HYPERCHOLESTEROLEMIA 12/17/2006  . Essential hypertension 12/17/2006  . GERD 12/17/2006    is allergic to cephalexin; clindamycin; oxycodone-acetaminophen; rocephin [ceftriaxone sodium in dextrose]; sulfonamide derivatives; and dilaudid [hydromorphone hcl].  MEDICAL HISTORY: Past Medical History:  Diagnosis Date  . ALLERGIC RHINITIS 12/13/2007  . ANXIETY 06/04/2008  . Blood transfusion 1964   post childbirth  . 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 Chronic combined systolic (congestive) and diastolic (congestive) heart failure ([place])   . DEGENERATIVE JOINT DISEASE 12/17/2006  . DEPRESSION 08/10/2007  . Dilated cardiomyopathy ([place]) 05/29/2015   a. Dx 05/2015 - EF 35-40% - felt secondary to XRT and chemo for breast CA. Normal coronaries by cath.  . DM type 2 (diabetes mellitus, type 2) ([place])   . Dyspnea    sometimes when walking , sometimes when sitting  . Essential hypertension   . GERD 12/17/2006  . History of blood transfusion    after child birth  . History of chemotherapy   . History of hiatal hernia   . History of radiation therapy 10/07/11-11/24/11   left breast,5040 cGy/28 sessions,l chest wall boost=1000cGy/5 sesions  . 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 Hyperlipidemia   . 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 OBESITY 06/04/2008  . 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  Pneumonia   . Premature atrial contractions   . Sinus bradycardia    a. 2018 event monitor which showed NSR with average HR 71, frequent PACs, occasional PVCs, and sinus bradycardia as low as 50.    SURGICAL HISTORY: Past Surgical History:  Procedure Laterality Date  . bladder tack  1974  . BREAST EXCISIONAL BIOPSY  11/10/1988   left benign Dr Margot Chimes  . BREAST EXCISIONAL BIOPSY  03/04/1983   Right - two areas - Dr Margot Chimes  . BREAST EXCISIONAL BIOPSY  10/09/1980   right - Dr Margot Chimes  . BREAST EXCISIONAL BIOPSY  10/18/1979   left - Dr Margot Chimes  . BREAST EXCISIONAL BIOPSY  01/02/1994   right - Dr Margot Chimes  . BREAST EXCISIONAL BIOPSY  2017   right  . BREAST SURGERY    . CARDIAC CATHETERIZATION N/A 05/29/2015   Procedure: Left Heart Cath and Coronary Angiography;  Surgeon: Belva Crome, MD;  Location: [facility];  Service: Cardiovascular;  Laterality: N/A;  . CHOLECYSTECTOMY  02/23/1991   LC - Dr Margot Chimes  . COLONOSCOPY    .  ENTEROCELE REPAIR  06/08   Dr. Cletis Media  . EVACUATION BREAST HEMATOMA  07/01/2011   Procedure: EVACUATION HEMATOMA BREAST;  Surgeon: Haywood Lasso, MD;  Location: [facility];  Service: General;  Laterality: Left;  Drainage of left mastectomy seroma  . EYE SURGERY Right    catartact  . HIP SURGERY Right 08/27/2010   DR. Maureen Ralphs-  "burcitis removal"  . LAMINECTOMY  1993   s/p-Dr. Rita Ohara  . MASTECTOMY Left 03/23/2011  . MODIFIED RADICAL MASTECTOMY W/ AXILLARY LYMPH NODE DISSECTION Left 03-30-11  . PORT-A-CATH REMOVAL  12/02/2011   Procedure: REMOVAL PORT-A-CATH;  Surgeon: Haywood Lasso, MD;  Location: [facility];  Service: General;  Laterality: N/A;  . PORTACATH PLACEMENT  04/13/2011   Procedure: INSERTION PORT-A-CATH;  Surgeon: Haywood Lasso, MD;  Location: [facility];  Service: General;  Laterality: N/A;  porta cath placement,removal of J-P drain  . PORTACATH PLACEMENT Right 11/25/2017   Procedure: INSERTION PORT-A-CATH  WITH [place];  Surgeon: Rolm Bookbinder, MD;  Location: [facility];  Service: General;  Laterality: Right;  . RADIOACTIVE SEED GUIDED EXCISIONAL BREAST BIOPSY Right 08/09/2015   Procedure: RIGHT RADIOACTIVE SEED GUIDED EXCISIONAL BREAST BIOPSY, RIGHT NIPPLE LESION EXCISION;  Surgeon: Rolm Bookbinder, MD;  Location: [facility];  Service: General;  Laterality: Right;  . ROTATOR CUFF REPAIR  8/03   right, Dr. Tonita Cong  . ROTATOR CUFF REPAIR  10/26/2008   left, Dr. Rush Farmer  . TONSILLECTOMY    . VAGINAL HYSTERECTOMY  1974   partial     SOCIAL HISTORY: Social History   Socioeconomic History  . Marital status: Married    Spouse name: Not on file  . Number of children: 3  . Years of education: Not on file  . Highest education level: Not on file  Occupational History    Employer: RETIRED  Social Needs  . Financial resource strain: Not on file  . Food insecurity:    Worry: Not on file    Inability: Not on file  . Transportation needs:    Medical: Not on file    Non-medical: Not on file  Tobacco Use  . Smoking status: Never Smoker  . Smokeless tobacco: Never Used  Substance and Sexual Activity  . Alcohol use: No  . Drug use: No  . Sexual activity: Yes  Lifestyle  . Physical activity:    Days per week: Not on file    Minutes per session: Not on file  . Stress: Not on file  Relationships  . Social connections:    Talks on phone: Not on file    Gets together: Not on file    Attends religious service: Not on file    Active member of club or organization: Not on file    Attends meetings of clubs or organizations: Not on file    Relationship status: Not on file  . Intimate partner violence:    Fear of current or ex partner: Not on file    Emotionally abused: Not on file    Physically abused: Not on file    Forced sexual activity: Not on file  Other Topics Concern  . 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 COPD Mother   . Arthritis Mother   . Emphysema Mother   . Diabetes Brother   . Cancer Brother        prostate  . Cancer Father        malignant brain tumor  . Mental illness Paternal Grandfather   . Heart disease Other        Sibling  . Diabetes Other        Sibling     Review of Systems  Constitutional: Positive for fatigue and unexpected weight change. Negative for appetite change and chills.  HENT:   Positive for mouth sores (resolved, but experienced with Taxotere). Negative for hearing loss, lump/mass and trouble swallowing.   Eyes: Negative for eye problems and icterus.  Respiratory: Negative for chest tightness, cough and shortness of breath.   Cardiovascular: Positive for palpitations. Negative for chest pain and leg swelling.  Gastrointestinal: Positive for constipation (LBM 2 days ago, has colace to take) and nausea (nauseated with lying flat). Negative for abdominal distention, abdominal pain, diarrhea and vomiting.  Endocrine: Negative for hot flashes.  Genitourinary: Negative for difficulty urinating.   Skin: Negative for itching and rash.  Neurological: Negative for dizziness, extremity weakness, headaches and numbness.  Psychiatric/Behavioral: Negative for depression. The patient is not nervous/anxious.       PHYSICAL EXAMINATION  ECOG PERFORMANCE STATUS: 3 - Symptomatic, >50% confined to bed  Vitals:   12/13/17 1418  BP: (!) 135/97  Pulse: (!) 53  Resp: 18  Temp: (!) 97.4 F (36.3 C)  SpO2: 98%  EKG shows rate of 103  Physical Exam  Constitutional: She is oriented to person, place, and time.  Appears tired and chronically ill   HENT:  Head: Normocephalic and atraumatic.  Mouth/Throat: Oropharynx is clear and moist. No oropharyngeal exudate.  Eyes: Pupils are equal, round, and  reactive to light. No scleral icterus.  Neck: Neck supple.  Cardiovascular:  Irregularly irregular  Pulmonary/Chest: Effort normal and breath sounds normal. No stridor. No respiratory distress.  Abdominal: Soft. Bowel sounds are normal. She exhibits distension. She exhibits no mass. There is no tenderness. There is no rebound and no guarding.  Lymphadenopathy:    She has no cervical adenopathy.  Neurological: She is alert and oriented to person, place, and time.  Skin: Skin is warm and dry. Capillary refill takes less than 2 seconds.  Psychiatric: She has a normal mood and affect.    LABORATORY DATA:  CBC    Component Value Date/Time   WBC 11.2 (H) 12/13/2017 1526   WBC 25.8 (H) 12/06/2017 0611   RBC 4.89 12/13/2017 1526   HGB 14.5 12/13/2017 1526   HGB 14.4 04/29/2016 1438   HCT 45.2 12/13/2017 1526   HCT 43.2 04/29/2016 1438   PLT 278 12/13/2017 1526   PLT 227 04/29/2016 1438   MCV 92.4 12/13/2017 1526   MCV 89.9 04/29/2016 1438   MCH 29.7 12/13/2017 1526   MCHC 32.1 12/13/2017 1526   RDW 14.7 12/13/2017 1526   RDW 14.1 04/29/2016 1438   LYMPHSABS 2.0 12/13/2017 1526   LYMPHSABS 2.6 04/29/2016 1438   MONOABS 0.8 12/13/2017 1526   MONOABS 0.6 04/29/2016 1438   EOSABS 0.0 12/13/2017 1526   EOSABS 0.1 04/29/2016 1438   BASOSABS 0.2 (H) 12/13/2017 1526   BASOSABS 0.1 04/29/2016 1438    CMP     Component Value Date/Time   NA 139 12/06/2017 0611   NA 142 04/29/2016 1438  K 4.1 12/06/2017 0611   K 4.1 04/29/2016 1438   CL 103 12/06/2017 0611   CL 102 02/25/2012 0908   CO2 28 12/06/2017 0611   CO2 31 (H) 04/29/2016 1438   GLUCOSE 128 (H) 12/06/2017 0611   GLUCOSE 93 04/29/2016 1438   GLUCOSE 232 (H) 02/25/2012 0908   BUN 8 12/06/2017 0611   BUN 14.4 04/29/2016 1438   CREATININE 0.74 12/06/2017 0611   CREATININE 0.95 12/03/2017 1213   CREATININE 0.8 04/29/2016 1438   CALCIUM 9.1 12/06/2017 0611   CALCIUM 10.3 04/29/2016 1438   PROT 6.2 (L) 12/03/2017 1213    PROT 7.0 04/29/2016 1438   ALBUMIN 2.8 (L) 12/03/2017 1213   ALBUMIN 3.7 04/29/2016 1438   AST 47 (H) 12/03/2017 1213   AST 15 04/29/2016 1438   ALT 49 (H) 12/03/2017 1213   ALT 15 04/29/2016 1438   ALKPHOS 126 12/03/2017 1213   ALKPHOS 127 04/29/2016 1438   BILITOT 0.9 12/03/2017 1213   BILITOT 0.53 04/29/2016 1438   GFRNONAA >60 12/06/2017 0611   GFRNONAA 56 (L) 12/03/2017 1213   GFRAA >60 12/06/2017 0611   GFRAA >60 12/03/2017 1213    EKG ordered today: Shows sinus tachycardia with PVCs that are more frequent than last EKG on 11/4  ASSESSMENT and PLAN:   Primary cancer of lower-inner quadrant of left female breast ([place]) Left breast invasive lobular cancer diagnosed 02/20/2011 status post left mastectomy with axillary lymph node dissection, 6 cm ILC with LVI, PNI, 9/16 lymph nodes positive with extracapsular extension, ER 100%, PR 100%, HER-2 -1.31, Ki-67 18%, T3a N2 a M0 stage IIIa status post F AC chemotherapy 4 followed by radiation and currently on letrozole 2.5 mg daily since 10/16/2011  Relapse/recurrence: Elevated LFTs led to ultrasound of the liver which revealed liver metastases September 2019  PET CT scan 11/04/2017: Heavy burden of metastatic disease to the liver involving all lobes, largest 7.7 x 5.4 cm with SUV of 16.6, additional hypermetabolic left supraclavicular, left subpectoral, portacaval and aortocaval lymphadenopathy  Liver biopsy: 11/17/2017: Metastatic breast cancer ER 100%, PR 0%, HER-2 3+ positive  Current treatment: .Systemic chemotherapy Taxotere Herceptin and Perjeta (held Herceptin and Perjeta because of low cardiac ejection fraction.)    Chemo toxicities: [place] tolerated Taxotere poorly evidenced by febrile neutropenia admission.  She is also noting PVCs and tachcyardia.  ? Relation to taxotere versus previous cardiac issues and decreased CHF.  I reviewed that I was concerned about her admission after the Taxotere, followed by the irregular  heart rate issues.  We will get some labs today to fully evaluate her CBC and electrolytes.  I will also get EKG.  She will see Dr. Haroldine Laws tomorrow.   I am reassured that she is slowly recovering and her activity level is slowly increasing.  She is also increasing in her appetite.  I encouraged her to continue to work on her strength and to use the incentive spirometer they gave her while in the hospital.     I reviewed with Jayliah that it will be a decision for Dr. Haroldine Laws and ultimately Dr. Lindi Adie regarding what medications/treatments to treat her with.  We reviewed that the importance would be to find a balance with risks versus benefits.  They understand this.    Return to clinic on 12/17/17 for labs and f/u with Dr. Lindi Adie.     Orders Placed This Encounter  Procedures  . CBC with Differential (Cancer Center Only)    Standing Status:   Future  Number of Occurrences:   1    Standing Expiration Date:   12/14/2018  . CMP ([facility] only)    Standing Status:   Future    Number of Occurrences:   1    Standing Expiration Date:   12/14/2018  . Magnesium    Standing Status:   Future    Number of Occurrences:   1    Standing Expiration Date:   12/14/2018    All questions were answered. The patient knows to call the clinic with any problems, questions or concerns. We can certainly see the patient much sooner if necessary. Red flags and when to go to ER reviewed.  Plan and EKG reviewed by Dr. Jana Hakim who is in agreement.    A total of (30) minutes of face-to-face time was spent with this patient with greater than 50% of that time in counseling and care-coordination.  This note was electronically signed. Scot Dock, NP 12/13/2017

## 2017-12-13 ENCOUNTER — Telehealth: Payer: Self-pay | Admitting: Hematology and Oncology

## 2017-12-13 ENCOUNTER — Inpatient Hospital Stay (HOSPITAL_BASED_OUTPATIENT_CLINIC_OR_DEPARTMENT_OTHER): Payer: Medicare HMO | Admitting: Adult Health

## 2017-12-13 ENCOUNTER — Inpatient Hospital Stay: Payer: Medicare HMO

## 2017-12-13 ENCOUNTER — Telehealth: Payer: Self-pay

## 2017-12-13 ENCOUNTER — Encounter: Payer: Self-pay | Admitting: Adult Health

## 2017-12-13 ENCOUNTER — Encounter: Payer: Self-pay | Admitting: Medical Oncology

## 2017-12-13 ENCOUNTER — Other Ambulatory Visit: Payer: Self-pay

## 2017-12-13 VITALS — BP 135/97 | HR 53 | Temp 97.4°F | Resp 18 | Ht 68.0 in | Wt 164.2 lb

## 2017-12-13 DIAGNOSIS — Z5111 Encounter for antineoplastic chemotherapy: Secondary | ICD-10-CM | POA: Diagnosis not present

## 2017-12-13 DIAGNOSIS — Z170 Estrogen receptor positive status [ER+]: Secondary | ICD-10-CM

## 2017-12-13 DIAGNOSIS — C50312 Malignant neoplasm of lower-inner quadrant of left female breast: Secondary | ICD-10-CM | POA: Diagnosis not present

## 2017-12-13 DIAGNOSIS — C50919 Malignant neoplasm of unspecified site of unspecified female breast: Secondary | ICD-10-CM

## 2017-12-13 DIAGNOSIS — D709 Neutropenia, unspecified: Secondary | ICD-10-CM | POA: Diagnosis not present

## 2017-12-13 DIAGNOSIS — C787 Secondary malignant neoplasm of liver and intrahepatic bile duct: Secondary | ICD-10-CM | POA: Diagnosis not present

## 2017-12-13 DIAGNOSIS — Z79811 Long term (current) use of aromatase inhibitors: Secondary | ICD-10-CM | POA: Diagnosis not present

## 2017-12-13 DIAGNOSIS — R5081 Fever presenting with conditions classified elsewhere: Secondary | ICD-10-CM | POA: Diagnosis not present

## 2017-12-13 DIAGNOSIS — T451X5A Adverse effect of antineoplastic and immunosuppressive drugs, initial encounter: Secondary | ICD-10-CM | POA: Diagnosis not present

## 2017-12-13 DIAGNOSIS — Z9012 Acquired absence of left breast and nipple: Secondary | ICD-10-CM | POA: Diagnosis not present

## 2017-12-13 DIAGNOSIS — R000 Tachycardia, unspecified: Secondary | ICD-10-CM | POA: Diagnosis not present

## 2017-12-13 DIAGNOSIS — Z23 Encounter for immunization: Secondary | ICD-10-CM | POA: Diagnosis not present

## 2017-12-13 DIAGNOSIS — Z79899 Other long term (current) drug therapy: Secondary | ICD-10-CM | POA: Diagnosis not present

## 2017-12-13 LAB — CMP (CANCER CENTER ONLY)
ALBUMIN: 3.1 g/dL — AB (ref 3.5–5.0)
ALT: 34 U/L (ref 0–44)
ANION GAP: 9 (ref 5–15)
AST: 45 U/L — ABNORMAL HIGH (ref 15–41)
Alkaline Phosphatase: 122 U/L (ref 38–126)
BUN: 20 mg/dL (ref 8–23)
CO2: 28 mmol/L (ref 22–32)
Calcium: 10.8 mg/dL — ABNORMAL HIGH (ref 8.9–10.3)
Chloride: 103 mmol/L (ref 98–111)
Creatinine: 0.97 mg/dL (ref 0.44–1.00)
GFR, Est AFR Am: >60 mL/min (ref >60)
GFR, Estimated: 55 mL/min — ABNORMAL LOW (ref >60)
GLUCOSE: 118 mg/dL — AB (ref 70–99)
POTASSIUM: 4.2 mmol/L (ref 3.5–5.1)
SODIUM: 140 mmol/L (ref 135–145)
TOTAL PROTEIN: 6.7 g/dL (ref 6.5–8.1)
Total Bilirubin: 0.4 mg/dL (ref 0.3–1.2)

## 2017-12-13 LAB — CBC WITH DIFFERENTIAL (CANCER CENTER ONLY)
ABS IMMATURE GRANULOCYTES: 0.11 10*3/uL — AB (ref 0.00–0.07)
BASOS PCT: 2 %
Basophils Absolute: 0.2 10*3/uL — ABNORMAL HIGH (ref 0.0–0.1)
Eosinophils Absolute: 0 10*3/uL (ref 0.0–0.5)
Eosinophils Relative: 0 %
HCT: 45.2 % (ref 36.0–46.0)
Hemoglobin: 14.5 g/dL (ref 12.0–15.0)
IMMATURE GRANULOCYTES: 1 %
Lymphocytes Relative: 18 %
Lymphs Abs: 2 10*3/uL (ref 0.7–4.0)
MCH: 29.7 pg (ref 26.0–34.0)
MCHC: 32.1 g/dL (ref 30.0–36.0)
MCV: 92.4 fL (ref 80.0–100.0)
Monocytes Absolute: 0.8 10*3/uL (ref 0.1–1.0)
Monocytes Relative: 8 %
NEUTROS ABS: 8.1 10*3/uL — AB (ref 1.7–7.7)
NEUTROS PCT: 71 %
PLATELETS: 278 10*3/uL (ref 150–400)
RBC: 4.89 MIL/uL (ref 3.87–5.11)
RDW: 14.7 % (ref 11.5–15.5)
WBC Count: 11.2 10*3/uL — ABNORMAL HIGH (ref 4.0–10.5)
nRBC: 0.2 % (ref 0.0–0.2)

## 2017-12-13 LAB — MAGNESIUM: MAGNESIUM: 1.6 mg/dL — AB (ref 1.7–2.4)

## 2017-12-13 NOTE — Telephone Encounter (Signed)
Printed avs. °

## 2017-12-13 NOTE — Telephone Encounter (Signed)
Received phone call from [facility] physical therapist to obtain verbal orders for pt treatments 1x/week for 1 week, 2x/week for 2 weeks and 1x/week for 1 week for strengthening and mobility. Verbal orders given per MD.

## 2017-12-13 NOTE — Assessment & Plan Note (Signed)
Left breast invasive lobular cancer diagnosed 02/20/2011 status post left mastectomy with axillary lymph node dissection, 6 cm ILC with LVI, PNI, 9/16 lymph nodes positive with extracapsular extension, ER 100%, PR 100%, HER-2 -1.31, Ki-67 18%, T3a N2 a M0 stage IIIa status post F AC chemotherapy 4 followed by radiation and currently on letrozole 2.5 mg daily since 10/16/2011  Relapse/recurrence: Elevated LFTs led to ultrasound of the liver which revealed liver metastases September 2019  PET CT scan 11/04/2017: Heavy burden of metastatic disease to the liver involving all lobes, largest 7.7 x 5.4 cm with SUV of 16.6, additional hypermetabolic left supraclavicular, left subpectoral, portacaval and aortocaval lymphadenopathy  Liver biopsy: 11/17/2017: Metastatic breast cancer ER 100%, PR 0%, HER-2 3+ positive  Current treatment: .Systemic chemotherapy Taxotere Herceptin and Perjeta (held Herceptin and Perjeta because of low cardiac ejection fraction.)    Chemo toxicities: [place] tolerated Taxotere poorly evidenced by febrile neutropenia admission.  She is also noting PVCs and tachcyardia.  ? Relation to taxotere versus previous cardiac issues and decreased CHF.  I reviewed that I was concerned about her admission after the Taxotere, followed by the irregular heart rate issues.  We will get some labs today to fully evaluate her CBC and electrolytes.  I will also get EKG.  She will see Dr. Haroldine Laws tomorrow.   I am reassured that she is slowly recovering and her activity level is slowly increasing.  She is also increasing in her appetite.  I encouraged her to continue to work on her strength and to use the incentive spirometer they gave her while in the hospital.     I reviewed with Janesa that it will be a decision for Dr. Haroldine Laws and ultimately Dr. Lindi Adie regarding what medications/treatments to treat her with.  We reviewed that the importance would be to find a balance with risks versus  benefits.  They understand this.    Return to clinic on 12/17/17 for labs and f/u with Dr. Lindi Adie.

## 2017-12-14 ENCOUNTER — Ambulatory Visit (HOSPITAL_COMMUNITY)
Admission: RE | Admit: 2017-12-14 | Discharge: 2017-12-14 | Disposition: A | Discharge status: Home / Self Care | Payer: Medicare HMO | Source: Ambulatory Visit | Attending: Internal Medicine | Admitting: Internal Medicine

## 2017-12-14 ENCOUNTER — Telehealth: Payer: Self-pay | Admitting: Hematology and Oncology

## 2017-12-14 VITALS — BP 120/60 | HR 94 | Wt 164.0 lb

## 2017-12-14 DIAGNOSIS — C50312 Malignant neoplasm of lower-inner quadrant of left female breast: Secondary | ICD-10-CM | POA: Diagnosis not present

## 2017-12-14 DIAGNOSIS — E669 Obesity, unspecified: Secondary | ICD-10-CM | POA: Insufficient documentation

## 2017-12-14 DIAGNOSIS — I491 Atrial premature depolarization: Secondary | ICD-10-CM | POA: Diagnosis not present

## 2017-12-14 DIAGNOSIS — Z881 Allergy status to other antibiotic agents status: Secondary | ICD-10-CM | POA: Diagnosis not present

## 2017-12-14 DIAGNOSIS — I110 Hypertensive heart disease with heart failure: Secondary | ICD-10-CM | POA: Insufficient documentation

## 2017-12-14 DIAGNOSIS — Z853 Personal history of malignant neoplasm of breast: Secondary | ICD-10-CM | POA: Insufficient documentation

## 2017-12-14 DIAGNOSIS — Z794 Long term (current) use of insulin: Secondary | ICD-10-CM | POA: Insufficient documentation

## 2017-12-14 DIAGNOSIS — C787 Secondary malignant neoplasm of liver and intrahepatic bile duct: Secondary | ICD-10-CM | POA: Diagnosis present

## 2017-12-14 DIAGNOSIS — Z79899 Other long term (current) drug therapy: Secondary | ICD-10-CM | POA: Diagnosis not present

## 2017-12-14 DIAGNOSIS — Z8249 Family history of ischemic heart disease and other diseases of the circulatory system: Secondary | ICD-10-CM | POA: Insufficient documentation

## 2017-12-14 DIAGNOSIS — K219 Gastro-esophageal reflux disease without esophagitis: Secondary | ICD-10-CM | POA: Insufficient documentation

## 2017-12-14 DIAGNOSIS — F419 Anxiety disorder, unspecified: Secondary | ICD-10-CM | POA: Insufficient documentation

## 2017-12-14 DIAGNOSIS — Z7722 Contact with and (suspected) exposure to environmental tobacco smoke (acute) (chronic): Secondary | ICD-10-CM | POA: Insufficient documentation

## 2017-12-14 DIAGNOSIS — F329 Major depressive disorder, single episode, unspecified: Secondary | ICD-10-CM | POA: Insufficient documentation

## 2017-12-14 DIAGNOSIS — Z833 Family history of diabetes mellitus: Secondary | ICD-10-CM | POA: Insufficient documentation

## 2017-12-14 DIAGNOSIS — Z882 Allergy status to sulfonamides status: Secondary | ICD-10-CM | POA: Diagnosis not present

## 2017-12-14 DIAGNOSIS — E785 Hyperlipidemia, unspecified: Secondary | ICD-10-CM | POA: Diagnosis not present

## 2017-12-14 DIAGNOSIS — R591 Generalized enlarged lymph nodes: Secondary | ICD-10-CM | POA: Insufficient documentation

## 2017-12-14 DIAGNOSIS — M8580 Other specified disorders of bone density and structure, unspecified site: Secondary | ICD-10-CM | POA: Diagnosis not present

## 2017-12-14 DIAGNOSIS — E1140 Type 2 diabetes mellitus with diabetic neuropathy, unspecified: Secondary | ICD-10-CM | POA: Diagnosis not present

## 2017-12-14 DIAGNOSIS — I5042 Chronic combined systolic (congestive) and diastolic (congestive) heart failure: Secondary | ICD-10-CM | POA: Insufficient documentation

## 2017-12-14 DIAGNOSIS — I493 Ventricular premature depolarization: Secondary | ICD-10-CM | POA: Diagnosis not present

## 2017-12-14 DIAGNOSIS — I428 Other cardiomyopathies: Secondary | ICD-10-CM | POA: Insufficient documentation

## 2017-12-14 DIAGNOSIS — Z885 Allergy status to narcotic agent status: Secondary | ICD-10-CM | POA: Insufficient documentation

## 2017-12-14 MED ORDER — LOSARTAN POTASSIUM 25 MG PO TABS: 12.5000 mg | ORAL_TABLET | Freq: Every day | ORAL | 6 refills | Status: DC

## 2017-12-14 NOTE — Telephone Encounter (Signed)
Called regarding 11/14 °

## 2017-12-14 NOTE — Progress Notes (Signed)
Cardio-Oncology Clinic Consult Note   Referring Physician: Dr. Lindi Adie Primary Care: Vicenta Aly, FNP Primary Cardiologist: Dr. Caffie Damme  HPI:  [name] is a 77 y.o. female with past medical history of chronic systolic HF due to NICM (EF 35%), HTN, HLD, DM2, and metastatic breast cancer to liver who has been referred by Dr. Lindi Adie to establish in the cardio-oncology clinic for monitoring of cardio-toxicity while undergoing chemotherapy.    Primary cancer of lower-inner quadrant of left female breast ([place])   02/20/2011 Initial Diagnosis    Left Breast invasive lobular carcinoma; 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 Her clinical staging was stage IIB (T3, N0, M0).      03/23/2011 Surgery    L mastectomy + Axillary LND 6 cm ILC with LVI, PNI; 9/16 LN positive with Extracapsular ext ER 100%; PR 100%; Her 2 1.31; Ki 67: 18; pT3a N2a (stage IIIA)    04/17/2011 - 06/19/2011 Chemotherapy    FEC X 4 complicated by febrile neutropenis, spesis and chest wall abscess    10/07/2011 - 11/24/2011 Radiation Therapy    Left chest wall and axilla 5040 cGy in 28 # with Boost    10/16/2011 -  Anti-estrogen oral therapy    Letrozole 2.5 mg po daily    05/27/2015 - 05/30/2015 Hospital Admission    Acute systolic CHF, dilated cardiomyopathy, EF 35-40%    08/09/2015 Surgery    Right lumpectomy: Fibrocystic changes with calcifications, right nipple lesion: Seborrheic keratosis no evidence of malignancy    10/20/2017 Imaging    Elevated LFTs letter ultrasound of the liver which showed liver metastases    11/04/2017 PET scan    Heavy burden of metastatic disease to the liver involving all lobes, index 7.7 centimeters right hepatic lobe mass SUV 16.6, small but hypermetabolic left supraclavicular, left subpectoral, portacaval, aortocaval adenopathy    11/12/2017 Pathology Results    Liver biopsy: Metastatic breast cancer,  ER 100%, PR 0%, Ki-67 10%, HER-2 3+ positive    11/19/2017 -  Chemotherapy    The patient had pegfilgrastim-cbqv (UDENYCA) injection 6 mg, 6 mg, Subcutaneous, Once, 1 of 6 cycles Administration: 6 mg (11/29/2017) trastuzumab (HERCEPTIN) 609 mg in sodium chloride 0.9 % 250 mL chemo infusion, 8 mg/kg = 609 mg, Intravenous,  Once, 1 of 6 cycles DOCEtaxel (TAXOTERE) 140 mg in sodium chloride 0.9 % 250 mL chemo infusion, 75 mg/m2 = 140 mg, Intravenous,  Once, 1 of 6 cycles Administration: 140 mg (11/26/2017) pertuzumab (PERJETA) 840 mg in sodium chloride 0.9 % 250 mL chemo infusion, 840 mg, Intravenous, Once, 1 of 6 cycles  for chemotherapy treatment.     Treated in 4/13 for left breast CA with FEC (including epirubicin)  Had elevated LFTs and found to have extensive liver mets + chest nodes.   Recently admitted 12/2017 from cancer center due to neutropenic fever. Found to have vaginal yeast infection. Treated empirically. Hydrated for hypotension. Stopped losartan and reduce spironolactone. ast echo 11/24/17 showed LVEF of 30-35% (stable from 35-40% in 2017) with diffuse hypokines and grade 1 DD. The findings indicate significant septal-lateral left ventricular wall dyssynchrony due to conduction delay.  Cath in 05/2015 with no CAD.   Last seen by [facility] 12/09/17. Was feeling weak and dyspneic with intermittent palpitations. C/o low appetite.   She presents today to establish with the cardio-oncology clinic. Husband and son present. Overall feeling better since recent admission. Gets around the house OK and does her ADLs without much difficulty, mostly just fatigued.  Her son is a paramedic with [employer], he and her husband help her a lot. They are worried about the potential for side effects from her chemo meds.   Echo 11/24/17 LVEF 30-35%, Grade 1 DD, Mild PI, significant dyssynchrony.   PET CT scan 11/04/2017: Heavy burden of metastatic disease to the liver involving all lobes, largest 7.7 x  5.4 cm with SUV of 16.6, additional hypermetabolic left supraclavicular, left subpectoral, portacaval and aortocaval lymphadenopathy  US Biopsy 11/12/17 positive for HER2 (3) tumor cells  Review of Systems: [y] = yes, _0  = no   . General: Weight gain _1 ; Weight loss _2 ; Anorexia _3 ; Fatigue [y]; Fever _4 ; Chills _5 ; Weakness [y]  . Cardiac: Chest pain/pressure _6 ; Resting SOB _7 ; Exertional SOB _8 ; Orthopnea _9 ; Pedal Edema _10 ; Palpitations _11 ; Syncope _12 ; Presyncope _13 ; Paroxysmal nocturnal dyspnea_14   . Pulmonary: Cough _15 ; Wheezing_16 ; Hemoptysis_17 ; Sputum _18 ; Snoring _19   . GI: Vomiting_20 ; Dysphagia_21 ; Melena_22 ; Hematochezia _23 ; Heartburn_24 ; Abdominal pain _25 ; Constipation _26 ; Diarrhea _27 ; BRBPR _28   . GU: Hematuria_29 ; Dysuria _30 ; Nocturia_31   . Vascular: Pain in legs with walking _32 ; Pain in feet with lying flat _33 ; Non-healing sores _34 ; Stroke _35 ; TIA _36 ; Slurred speech _37 ;  . Neuro: Headaches_38 ; Vertigo_39 ; Seizures_40 ; Paresthesias_41 ;Blurred vision _42 ; Diplopia _43 ; Vision changes _44   . Ortho/Skin: Arthritis [y]; Joint pain [y]; Muscle pain _45 ; Joint swelling _46 ; Back Pain _47 ; Rash _48   . Psych: Depression_49 ; Anxiety_50   . Heme: Bleeding problems _51 ; Clotting disorders _52 ; Anemia _53   . Endocrine: Diabetes _54 ; Thyroid dysfunction_55    Past Medical History:  Diagnosis Date  . ALLERGIC RHINITIS 12/13/2007  . ANXIETY 06/04/2008  . Blood transfusion 1964   post childbirth  . 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 Chronic combined systolic (congestive) and diastolic (congestive) heart failure ([place])   . DEGENERATIVE JOINT DISEASE 12/17/2006  . DEPRESSION 08/10/2007  . Dilated cardiomyopathy ([place]) 05/29/2015   a. Dx 05/2015 - EF 35-40% - felt secondary to XRT and chemo for breast CA. Normal coronaries by cath.  . DM type 2 (diabetes mellitus, type 2) ([place])   . Dyspnea    sometimes when walking ,  sometimes when sitting  . Essential hypertension   . GERD 12/17/2006  . History of blood transfusion    after child birth  . History of chemotherapy   . History of hiatal hernia   . History of radiation therapy 10/07/11-11/24/11   left breast,5040 cGy/28 sessions,l chest wall boost=1000cGy/5 sesions  . 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 Hyperlipidemia   . 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 OBESITY 06/04/2008  . 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 Pneumonia   . Premature atrial contractions   . Sinus bradycardia    a. 2018 event monitor which showed NSR with average HR 71, frequent PACs, occasional PVCs, and sinus bradycardia as low as 50.    Current Outpatient Medications  Medication Sig Dispense Refill  . acyclovir (ZOVIRAX) 200 MG capsule TAKE 2 CAPSULES BY MOUTH 3 TIMES DAILY 540 capsule 3  . ALPRAZolam (XANAX) 0.5 MG tablet TAKE ONE-HALF TO ONE TABLET  BY MOUTH THREE TIMES DAILY AS NEEDED FOR  NERVES 270 tablet 1  . carvedilol (COREG) 6.25 MG tablet TAKE 1 TABLET TWICE DAILY WITH A MEAL 180 tablet 1  . cholecalciferol (VITAMIN D) 1000 UNITS tablet Take 1 tablet (1,000 Units total) by mouth daily. 90 tablet 3  . cyanocobalamin 500 MCG tablet Take 1 tablet (500 mcg total) by mouth daily. 90 tablet 3  . fluconazole (DIFLUCAN) 100 MG tablet Take 1 tablet (100 mg total) by mouth daily. 7 tablet 0  . HYDROcodone-acetaminophen (NORCO) 10-325 MG tablet Take 1 tablet by mouth every 6 (six) hours as needed. 5 tablet 0  . Insulin Pen Needle (RELION PEN NEEDLE 31G/8MM) 31G X 8 MM MISC 1 Device by Does not apply route 4 (four) times daily. 120 each 11  . lidocaine-prilocaine (EMLA) cream Apply to affected area once 30 g 3  . magic mouthwash w/lidocaine SOLN Take 5 mLs by mouth 3 (three) times daily as needed for mouth pain. 50 mL 0  . magnesium oxide (MAG-OX) 400 MG tablet Take 1 tablet (400 mg total) by mouth daily. 30 tablet 0  . Multiple Vitamins-Minerals (ONE-A-DAY WOMENS 50+ ADVANTAGE) TABS Take 1 tablet  by mouth daily. 90 tablet 3  . nystatin (MYCOSTATIN) 100000 UNIT/ML suspension Take 5 mLs by mouth 4 (four) times daily.  0  . omeprazole (PRILOSEC) 40 MG capsule Take 40 mg by mouth daily.    . ondansetron (ZOFRAN) 8 MG tablet Take 1 tablet (8 mg total) by mouth 2 (two) times daily as needed for refractory nausea / vomiting. 30 tablet 1  . prochlorperazine (COMPAZINE) 10 MG tablet Take 1 tablet (10 mg total) by mouth every 6 (six) hours as needed (Nausea or vomiting). 30 tablet 1  . sertraline (ZOLOFT) 50 MG tablet Take 1 tablet (50 mg total) by mouth daily. 90 tablet 3  . spironolactone (ALDACTONE) 25 MG tablet Take 0.5 tablets (12.5 mg total) by mouth every Monday, Wednesday, and Friday. 30 tablet 6   No current facility-administered medications for this visit.     Allergies  Allergen Reactions  . Cephalexin Hives  . Clindamycin Hives  . Oxycodone-Acetaminophen Hives and Itching  . Rocephin [Ceftriaxone Sodium In Dextrose] Hives  . Sulfonamide Derivatives Hives  . Dilaudid [Hydromorphone Hcl] Itching      Social History   Socioeconomic History  . Marital status: Married    Spouse name: Not on file  . Number of children: 3  . Years of education: Not on file  . Highest education level: Not on file  Occupational History    Employer: RETIRED  Social Needs  . Financial resource strain: Not on file  . Food insecurity:    Worry: Not on file    Inability: Not on file  . Transportation needs:    Medical: Not on file    Non-medical: Not on file  Tobacco Use  . Smoking status: Never Smoker  . Smokeless tobacco: Never Used  Substance and Sexual Activity  . Alcohol use: No  . Drug use: No  . Sexual activity: Yes  Lifestyle  . Physical activity:    Days per week: Not on file    Minutes per session: Not on file  . Stress: Not on file  Relationships  . Social connections:    Talks on phone: Not on file    Gets together: Not on file    Attends religious service: Not on file      Active member of club  or organization: Not on file    Attends meetings of clubs or organizations: Not on file    Relationship status: Not on file  . Intimate partner violence:    Fear of current or ex partner: Not on file    Emotionally abused: Not on file    Physically abused: Not on file    Forced sexual activity: Not on file  Other Topics Concern  . 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 COPD Mother   . Arthritis Mother   . Emphysema Mother   . Diabetes Brother   . Cancer Brother        prostate  . Cancer Father        malignant brain tumor  . Mental illness Paternal Grandfather   . Heart disease Other        Sibling  . Diabetes Other        Sibling    Vitals:   12/14/17 1503  BP: 120/60  Pulse: 94  SpO2: 96%  Weight: 74.4 kg (164 lb)   Wt Readings from Last 3 Encounters:  12/14/17 74.4 kg (164 lb)  12/13/17 74.5 kg (164 lb 3.2 oz)  12/09/17 74.8 kg (164 lb 12.8 oz)    PHYSICAL EXAM: General:  Fatigued appearing. NAD.  HEENT: normal x for alopecia Neck: supple. JVP 6-7 cm. RIJ port Carotids 2+ bilat; no bruits. No lymphadenopathy or thyromegaly appreciated. Cor: PMI nondisplaced. Regular though with occasional ectopy. No rubs, gallops or murmurs. Lungs: clear Abdomen: obese soft, nontender, nondistended. No hepatosplenomegaly. No bruits or masses. Good bowel sounds. Extremities: no cyanosis, clubbing, rash, edema Neuro: alert & oriented x 3, cranial nerves grossly intact. moves all 4 extremities w/o difficulty. Affect pleasant.  EKG 12/13/17 sinus tach 103 bpm, QRS 98 ms  ASSESSMENT & PLAN:  1. Breast Cancer with liver met by biopsy 11/12/17 - Last chemo 10/25, last Neulasta on 10/28 - Taxotere/Herceptin and Perjeta on hold currently  with recent low LVEF. Per Dr. Haroldine Laws, would recommend starting.   - PET CT scan 11/04/2017: Heavy burden of metastatic disease to the liver involving all lobes, largest 7.7 x 5.4 cm with SUV of 16.6, additional hypermetabolic left supraclavicular, left subpectoral, portacaval and aortocaval lymphadenopathy - Explained incidence of Herceptin cardiotoxicity in the setting of suspected previous anthracycline toxicity and the role of Cardio-oncology clinic at length. Echo images reviewed personally. All parameters stable. Reviewed signs and symptoms of HF to look for. Ok to resume chemotherapy. Follow-up with echo in 2-3 months.  2. Chronic systolic and diastolic CHF due to NICM - Echo 11/24/17 LVEF 30-35% with diffuse HK and grade 1 DD + dyssynchrony - Cath in 2017 showed normal coronaries.  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  - Holter monitor placed 12/10/17 with history of PVCs.  - NYHA III symptoms, confounded by fatigue.  - Volume status OK on exam.   - Continue coreg 6.25 mg BID - Continue spironolactone 12.5 mg daily.  - Resume losartan at 12.5 mg QHS.  - If having problem with fluid, can consider prn lasix.   3. Hypomagnesemia - Increased Mg to 400 mg BID in setting  of PVCs - Can cut back if develops diarrhea.   Shirley Friar, PA-C 12/14/17   Patient seen and examined with the above-signed Advanced Practice Provider and/or Housestaff. I personally reviewed laboratory data, imaging studies and relevant notes. I independently examined the patient and formulated the important aspects of the plan. I have edited the note to reflect any of my changes or salient points. I have personally discussed the plan with the patient and/or family.  Long discussion with patient and her family about situation. 77 y/o woman with h/o breast CA and NICM now with diffuse liver mets with HER-2+ receptors.  Cath 4/17 with normal coronaries. EF 30-40%.  Suspect CM related to Epirubicin.   We discussed pros/cons of Herceptin therapy. Given previous anthracycline exposure risk of cardio-toxicity is about 30%. However if cardiotoxicity occurs should be completely reversible with withholding Herceptin   Would thus proceed with herceptin/Pejeta therapy and we wil follow EF closely with q3 months echos. Will continue carvedilol and restart losartan at 12.64m qhs for cardioprotection.   She has PVCs on exam. Await monitor results. If PVC burden > 10% may be contributing to cardimyopathy and worth consideration of PVC suppression. Magnesium is low. Will increase supplementation.   DGlori Bickers MD  4:10 PM

## 2017-12-14 NOTE — Patient Instructions (Signed)
START Losartan 12.5mg  (0.5 tab) nightly before bed.   Your physician has requested that you have an echocardiogram. Echocardiography is a painless test that uses sound waves to create images of your heart. It provides your doctor with information about the size and shape of your heart and how well your heart's chambers and valves are working. This procedure takes approximately one hour. There are no restrictions for this procedure.  Your physician recommends that you schedule a follow-up appointment in: 3 months with an echo.

## 2017-12-15 ENCOUNTER — Telehealth: Payer: Self-pay | Admitting: *Deleted

## 2017-12-15 NOTE — Telephone Encounter (Signed)
On 12/10/17 a 24 hour cardiac holter monitor was applied to patient.  This was to be returned to our office 12/11/17.  To date, we have not received the monitor back.  Please return monitor to our office ASAP.  Please call [phone] and leave a telephone message for [facility] in monitors if you are having a problem returning the monitor.  Thank you.

## 2017-12-16 ENCOUNTER — Telehealth: Payer: Self-pay | Admitting: Hematology and Oncology

## 2017-12-16 ENCOUNTER — Inpatient Hospital Stay (HOSPITAL_BASED_OUTPATIENT_CLINIC_OR_DEPARTMENT_OTHER): Payer: Medicare HMO | Admitting: Hematology and Oncology

## 2017-12-16 DIAGNOSIS — D709 Neutropenia, unspecified: Secondary | ICD-10-CM | POA: Diagnosis not present

## 2017-12-16 DIAGNOSIS — Z170 Estrogen receptor positive status [ER+]: Secondary | ICD-10-CM | POA: Diagnosis not present

## 2017-12-16 DIAGNOSIS — C50312 Malignant neoplasm of lower-inner quadrant of left female breast: Secondary | ICD-10-CM | POA: Diagnosis not present

## 2017-12-16 DIAGNOSIS — Z5111 Encounter for antineoplastic chemotherapy: Secondary | ICD-10-CM | POA: Diagnosis not present

## 2017-12-16 DIAGNOSIS — R000 Tachycardia, unspecified: Secondary | ICD-10-CM

## 2017-12-16 DIAGNOSIS — R5081 Fever presenting with conditions classified elsewhere: Secondary | ICD-10-CM

## 2017-12-16 DIAGNOSIS — T451X5S Adverse effect of antineoplastic and immunosuppressive drugs, sequela: Secondary | ICD-10-CM

## 2017-12-16 DIAGNOSIS — C787 Secondary malignant neoplasm of liver and intrahepatic bile duct: Secondary | ICD-10-CM | POA: Diagnosis not present

## 2017-12-16 DIAGNOSIS — Z9012 Acquired absence of left breast and nipple: Secondary | ICD-10-CM

## 2017-12-16 DIAGNOSIS — Z79811 Long term (current) use of aromatase inhibitors: Secondary | ICD-10-CM

## 2017-12-16 MED ORDER — LIRAGLUTIDE 18 MG/3ML ~~LOC~~ SOPN: 1.8000 mg | PEN_INJECTOR | Freq: Every day | SUBCUTANEOUS | Status: AC

## 2017-12-16 NOTE — Progress Notes (Signed)
Patient Care Team: Vicenta Aly, FNP as PCP - General (Nurse Practitioner) Neldon Mc, MD as Surgeon (General Surgery) Sueanne Margarita, MD as Consulting Physician (Cardiology)  DIAGNOSIS:  Encounter Diagnosis  Name Primary?  . Primary cancer of lower-inner quadrant of left female breast ([place])     SUMMARY OF ONCOLOGIC HISTORY:   Primary cancer of lower-inner quadrant of left female breast ([place])   02/20/2011 Initial Diagnosis    Left Breast invasive lobular carcinoma; 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 Her clinical staging was stage IIB (T3, N0, M0).      03/23/2011 Surgery    L mastectomy + Axillary LND 6 cm ILC with LVI, PNI; 9/16 LN positive with Extracapsular ext ER 100%; PR 100%; Her 2 1.31; Ki 67: 18; pT3a N2a (stage IIIA)    04/17/2011 - 06/19/2011 Chemotherapy    FEC X 4 complicated by febrile neutropenis, spesis and chest wall abscess    10/07/2011 - 11/24/2011 Radiation Therapy    Left chest wall and axilla 5040 cGy in 28 # with Boost    10/16/2011 -  Anti-estrogen oral therapy    Letrozole 2.5 mg po daily    05/27/2015 - 05/30/2015 Hospital Admission    Acute systolic CHF, dilated cardiomyopathy, EF 35-40%    08/09/2015 Surgery    Right lumpectomy: Fibrocystic changes with calcifications, right nipple lesion: Seborrheic keratosis no evidence of malignancy    10/20/2017 Imaging    Elevated LFTs letter ultrasound of the liver which showed liver metastases    11/04/2017 PET scan    Heavy burden of metastatic disease to the liver involving all lobes, index 7.7 centimeters right hepatic lobe mass SUV 16.6, small but hypermetabolic left supraclavicular, left subpectoral, portacaval, aortocaval adenopathy    11/12/2017 Pathology Results    Liver biopsy: Metastatic breast cancer, ER 100%, PR 0%, Ki-67 10%, HER-2 3+ positive    11/19/2017 -  Chemotherapy    Taxotere, Herceptin, Perjeta, (herceptin and perjeta on hold due to  decreased ejection fraction)     CHIEF COMPLIANT: Follow-up to discuss her treatment plan  INTERVAL HISTORY: [name] is a 77 year old with above-mentioned history of metastatic breast cancer with liver metastases who received 1 dosage of Taxotere 3 weeks ago and developed profound side effects including neutropenic fever decreased appetite nausea fatigue and she is here to discuss her treatment plan.  She saw cardiology who reviewed her history and recommended that she can receive Herceptin and Perjeta.  She is here today to discuss her treatment plan for tomorrow.  She has fatigue but it is improving slowly.  REVIEW OF SYSTEMS:   Constitutional: Denies fevers, chills or abnormal weight loss Eyes: Denies blurriness of vision Ears, nose, mouth, throat, and face: Denies mucositis or sore throat Respiratory: Denies cough, dyspnea or wheezes Cardiovascular: Denies palpitation, chest discomfort Gastrointestinal:  Denies nausea, heartburn or change in bowel habits Skin: Denies abnormal skin rashes Lymphatics: Denies new lymphadenopathy or easy bruising Neurological:Denies numbness, tingling or new weaknesses Behavioral/Psych: Mood is stable, no new changes  Extremities: No lower extremity edema Breast:   denies any pain or lumps or nodules in either breasts All other systems were reviewed with the patient and are negative.  I have reviewed the past medical history, past surgical history, social history and family history with the patient and they are unchanged from previous note.  ALLERGIES:  is allergic to cephalexin; clindamycin; oxycodone-acetaminophen; rocephin [ceftriaxone sodium in dextrose]; sulfonamide derivatives; and dilaudid [hydromorphone hcl].  MEDICATIONS:  Current Outpatient Medications  Medication Sig Dispense Refill  . acyclovir (ZOVIRAX) 200 MG capsule TAKE 2 CAPSULES BY MOUTH 3 TIMES DAILY 540 capsule 3  . ALPRAZolam (XANAX) 0.5 MG tablet TAKE ONE-HALF TO ONE  TABLET BY MOUTH THREE TIMES DAILY AS NEEDED FOR  NERVES 270 tablet 1  . carvedilol (COREG) 6.25 MG tablet TAKE 1 TABLET TWICE DAILY WITH A MEAL 180 tablet 1  . cholecalciferol (VITAMIN D) 1000 UNITS tablet Take 1 tablet (1,000 Units total) by mouth daily. 90 tablet 3  . cyanocobalamin 500 MCG tablet Take 1 tablet (500 mcg total) by mouth daily. 90 tablet 3  . fluconazole (DIFLUCAN) 100 MG tablet Take 1 tablet (100 mg total) by mouth daily. 7 tablet 0  . HYDROcodone-acetaminophen (NORCO) 10-325 MG tablet Take 1 tablet by mouth every 6 (six) hours as needed. 5 tablet 0  . Insulin Pen Needle (RELION PEN NEEDLE 31G/8MM) 31G X 8 MM MISC 1 Device by Does not apply route 4 (four) times daily. 120 each 11  . lidocaine-prilocaine (EMLA) cream Apply to affected area once 30 g 3  . liraglutide (VICTOZA) 18 MG/3ML SOPN Inject 0.3 mLs (1.8 mg total) into the skin daily.    Marland Kitchen losartan (COZAAR) 25 MG tablet Take 0.5 tablets (12.5 mg total) by mouth at bedtime. 15 tablet 6  . magic mouthwash w/lidocaine SOLN Take 5 mLs by mouth 3 (three) times daily as needed for mouth pain. 50 mL 0  . magnesium oxide (MAG-OX) 400 MG tablet Take 1 tablet (400 mg total) by mouth daily. (Patient taking differently: Take 400 mg by mouth daily. Take 2 daily) 30 tablet 0  . Multiple Vitamins-Minerals (ONE-A-DAY WOMENS 50+ ADVANTAGE) TABS Take 1 tablet by mouth daily. 90 tablet 3  . nystatin (MYCOSTATIN) 100000 UNIT/ML suspension Take 5 mLs by mouth 4 (four) times daily.  0  . omeprazole (PRILOSEC) 40 MG capsule Take 40 mg by mouth daily.    . ondansetron (ZOFRAN) 8 MG tablet Take 1 tablet (8 mg total) by mouth 2 (two) times daily as needed for refractory nausea / vomiting. 30 tablet 1  . prochlorperazine (COMPAZINE) 10 MG tablet Take 1 tablet (10 mg total) by mouth every 6 (six) hours as needed (Nausea or vomiting). 30 tablet 1  . sertraline (ZOLOFT) 50 MG tablet Take 1 tablet (50 mg total) by mouth daily. 90 tablet 3  . spironolactone  (ALDACTONE) 25 MG tablet Take 0.5 tablets (12.5 mg total) by mouth every Monday, Wednesday, and Friday. 30 tablet 6   No current facility-administered medications for this visit.     PHYSICAL EXAMINATION: ECOG PERFORMANCE STATUS: 1 - Symptomatic but completely ambulatory  Vitals:   12/16/17 0940  BP: 122/65  Pulse: (!) 43  Resp: 17  Temp: 97.9 F (36.6 C)  SpO2: 98%   Filed Weights   12/16/17 0940  Weight: 163 lb 11.2 oz (74.3 kg)    GENERAL:alert, no distress and comfortable SKIN: skin color, texture, turgor are normal, no rashes or significant lesions EYES: normal, Conjunctiva are pink and non-injected, sclera clear OROPHARYNX:no exudate, no erythema and lips, buccal mucosa, and tongue normal  NECK: supple, thyroid normal size, non-tender, without nodularity LYMPH:  no palpable lymphadenopathy in the cervical, axillary or inguinal LUNGS: clear to auscultation and percussion with normal breathing effort HEART: regular rate & rhythm and no murmurs and no lower extremity edema ABDOMEN:abdomen soft, non-tender and normal bowel sounds MUSCULOSKELETAL:no cyanosis of digits and no clubbing  NEURO: alert & oriented  x 3 with fluent speech, no focal motor/sensory deficits EXTREMITIES: No lower extremity edema   LABORATORY DATA:  I have reviewed the data as listed CMP Latest Ref Rng & Units 12/13/2017 12/06/2017 12/05/2017  Glucose 70 - 99 mg/dL 118(H) 128(H) 116(H)  BUN 8 - 23 mg/dL '20 8 10  '$ Creatinine 0.44 - 1.00 mg/dL 0.97 0.74 0.72  Sodium 135 - 145 mmol/L 140 139 140  Potassium 3.5 - 5.1 mmol/L 4.2 4.1 3.9  Chloride 98 - 111 mmol/L 103 103 107  CO2 22 - 32 mmol/L '28 28 27  '$ Calcium 8.9 - 10.3 mg/dL 10.8(H) 9.1 8.4(L)  Total Protein 6.5 - 8.1 g/dL 6.7 - -  Total Bilirubin 0.3 - 1.2 mg/dL 0.4 - -  Alkaline Phos 38 - 126 U/L 122 - -  AST 15 - 41 U/L 45(H) - -  ALT 0 - 44 U/L 34 - -    Lab Results  Component Value Date   WBC 11.2 (H) 12/13/2017   HGB 14.5 12/13/2017    HCT 45.2 12/13/2017   MCV 92.4 12/13/2017   PLT 278 12/13/2017   NEUTROABS 8.1 (H) 12/13/2017    ASSESSMENT & PLAN:  Primary cancer of lower-inner quadrant of left female breast ([place]) Left breast invasive lobular cancer diagnosed 02/20/2011 status post left mastectomy with axillary lymph node dissection, 6 cm ILC with LVI, PNI, 9/16 lymph nodes positive with extracapsular extension, ER 100%, PR 100%, HER-2 -1.31, Ki-67 18%, T3a N2 a M0 stage IIIa status post F AC chemotherapy 4 followed by radiation and currently on letrozole 2.5 mg daily since 10/16/2011  Relapse/recurrence: Elevated LFTs led to ultrasound of the liver which revealed liver metastases September 2019  PET CT scan 11/04/2017: Heavy burden of metastatic disease to the liver involving all lobes, largest 7.7 x 5.4 cm with SUV of 16.6, additional hypermetabolic left supraclavicular, left subpectoral, portacaval and aortocaval lymphadenopathy  Liver biopsy: 11/17/2017: Metastatic breast cancer ER 100%, PR 0%, HER-2 3+ positive  Current treatment: .Systemic chemotherapy TaxotereHerceptin and Perjeta (held Herceptin and Perjeta because of low cardiac ejection fraction.)  Chemo toxicities: 1.  Febrile neutropenia 2.  PVCs and tachycardia  I discussed with the patient that we will discontinue Taxotere because of the adverse effects.  Dr. Haroldine Laws advised [place] that we can start Herceptin and Perjeta.  CARIS molecular testing 11/12/2017: Revealed PIK3CA mutation, androgen receptor positivity, TMB 7 Mut/Mb  Treatment plan: Herceptin Perjeta with Faslodex Potential future treatment options: CDK 4 and 6 inhibitors and Alpelisib Patient will see this treatment tomorrow.  She will come back in 2 weeks to receive Faslodex injection.  And in 4 weeks for follow-up with me.  No orders of the defined types were placed in this encounter.  The patient has a good understanding of the overall plan. she agrees with it. she will call with  any problems that may develop before the next visit here.   Harriette Ohara, MD 12/16/17

## 2017-12-16 NOTE — Assessment & Plan Note (Signed)
Left breast invasive lobular cancer diagnosed 02/20/2011 status post left mastectomy with axillary lymph node dissection, 6 cm ILC with LVI, PNI, 9/16 lymph nodes positive with extracapsular extension, ER 100%, PR 100%, HER-2 -1.31, Ki-67 18%, T3a N2 a M0 stage IIIa status post F AC chemotherapy 4 followed by radiation and currently on letrozole 2.5 mg daily since 10/16/2011  Relapse/recurrence: Elevated LFTs led to ultrasound of the liver which revealed liver metastases September 2019  PET CT scan 11/04/2017: Heavy burden of metastatic disease to the liver involving all lobes, largest 7.7 x 5.4 cm with SUV of 16.6, additional hypermetabolic left supraclavicular, left subpectoral, portacaval and aortocaval lymphadenopathy  Liver biopsy: 11/17/2017: Metastatic breast cancer ER 100%, PR 0%, HER-2 3+ positive  Current treatment: .Systemic chemotherapy TaxotereHerceptin and Perjeta (held Herceptin and Perjeta because of low cardiac ejection fraction.)  Chemo toxicities: 1.  Febrile neutropenia 2.  PVCs and tachycardia  I discussed with the patient that we will discontinue Taxotere because of the adverse effects.  Dr. Bensimhon advised us that we can start Herceptin and Perjeta.  CARIS molecular testing 11/12/2017: Revealed PIK3CA mutation, androgen receptor positivity, TMB 7 Mut/Mb  Treatment plan: Herceptin Perjeta with Faslodex Potential future treatment options: CDK 4 and 6 inhibitors and Alpelisib   

## 2017-12-16 NOTE — Telephone Encounter (Signed)
Gave patient avs and calendar.  Day adjusted due to Dr. availability ([facility]).

## 2017-12-17 ENCOUNTER — Inpatient Hospital Stay: Payer: Medicare HMO

## 2017-12-17 ENCOUNTER — Ambulatory Visit: Payer: Medicare HMO | Admitting: Hematology and Oncology

## 2017-12-17 ENCOUNTER — Inpatient Hospital Stay: Payer: Medicare HMO | Admitting: Nutrition

## 2017-12-17 VITALS — BP 153/71 | HR 81 | Temp 98.1°F | Resp 16

## 2017-12-17 DIAGNOSIS — Z23 Encounter for immunization: Secondary | ICD-10-CM

## 2017-12-17 DIAGNOSIS — Z95828 Presence of other vascular implants and grafts: Secondary | ICD-10-CM

## 2017-12-17 DIAGNOSIS — C50312 Malignant neoplasm of lower-inner quadrant of left female breast: Secondary | ICD-10-CM

## 2017-12-17 DIAGNOSIS — Z5111 Encounter for antineoplastic chemotherapy: Secondary | ICD-10-CM | POA: Diagnosis not present

## 2017-12-17 DIAGNOSIS — M8580 Other specified disorders of bone density and structure, unspecified site: Secondary | ICD-10-CM

## 2017-12-17 LAB — CBC WITH DIFFERENTIAL (CANCER CENTER ONLY)
Abs Immature Granulocytes: 0.03 10*3/uL (ref 0.00–0.07)
BASOS ABS: 0.2 10*3/uL — AB (ref 0.0–0.1)
BASOS PCT: 4 %
EOS ABS: 0 10*3/uL (ref 0.0–0.5)
EOS PCT: 0 %
HEMATOCRIT: 41.1 % (ref 36.0–46.0)
Hemoglobin: 12.9 g/dL (ref 12.0–15.0)
IMMATURE GRANULOCYTES: 1 %
Lymphocytes Relative: 23 %
Lymphs Abs: 1.2 10*3/uL (ref 0.7–4.0)
MCH: 29.2 pg (ref 26.0–34.0)
MCHC: 31.4 g/dL (ref 30.0–36.0)
MCV: 93 fL (ref 80.0–100.0)
Monocytes Absolute: 0.6 10*3/uL (ref 0.1–1.0)
Monocytes Relative: 11 %
Neutro Abs: 3.2 10*3/uL (ref 1.7–7.7)
Neutrophils Relative %: 61 %
PLATELETS: 313 10*3/uL (ref 150–400)
RBC: 4.42 MIL/uL (ref 3.87–5.11)
RDW: 15.1 % (ref 11.5–15.5)
WBC Count: 5.3 10*3/uL (ref 4.0–10.5)
nRBC: 0 % (ref 0.0–0.2)

## 2017-12-17 LAB — CMP (CANCER CENTER ONLY)
ALK PHOS: 103 U/L (ref 38–126)
ALT: 33 U/L (ref 0–44)
ANION GAP: 9 (ref 5–15)
AST: 44 U/L — ABNORMAL HIGH (ref 15–41)
Albumin: 2.9 g/dL — ABNORMAL LOW (ref 3.5–5.0)
BILIRUBIN TOTAL: 0.4 mg/dL (ref 0.3–1.2)
BUN: 17 mg/dL (ref 8–23)
CALCIUM: 9.7 mg/dL (ref 8.9–10.3)
CO2: 25 mmol/L (ref 22–32)
CREATININE: 0.81 mg/dL (ref 0.44–1.00)
Chloride: 108 mmol/L (ref 98–111)
Glucose, Bld: 189 mg/dL — ABNORMAL HIGH (ref 70–99)
Potassium: 3.8 mmol/L (ref 3.5–5.1)
Sodium: 142 mmol/L (ref 135–145)
TOTAL PROTEIN: 5.8 g/dL — AB (ref 6.5–8.1)

## 2017-12-17 MED ORDER — TRASTUZUMAB CHEMO 150 MG IV SOLR
450.0000 mg | Freq: Once | INTRAVENOUS | Status: AC
  Administered 2017-12-17: 450 mg via INTRAVENOUS
  Filled 2017-12-17: qty 21.43

## 2017-12-17 MED ORDER — SODIUM CHLORIDE 0.9 % IV SOLN
Freq: Once | INTRAVENOUS | Status: AC
  Administered 2017-12-17: 09:00:00 via INTRAVENOUS
  Filled 2017-12-17: qty 250

## 2017-12-17 MED ORDER — ACETAMINOPHEN 325 MG PO TABS
ORAL_TABLET | ORAL | Status: AC
  Filled 2017-12-17: qty 2

## 2017-12-17 MED ORDER — INFLUENZA VAC SPLIT QUAD 0.5 ML IM SUSY
0.5000 mL | PREFILLED_SYRINGE | Freq: Once | INTRAMUSCULAR | Status: AC
  Administered 2017-12-17: 0.5 mL via INTRAMUSCULAR

## 2017-12-17 MED ORDER — DIPHENHYDRAMINE HCL 25 MG PO CAPS
50.0000 mg | ORAL_CAPSULE | Freq: Once | ORAL | Status: AC
  Administered 2017-12-17: 50 mg via ORAL

## 2017-12-17 MED ORDER — SODIUM CHLORIDE 0.9% FLUSH
10.0000 mL | Freq: Once | INTRAVENOUS | Status: AC
  Administered 2017-12-17: 10 mL
  Filled 2017-12-17: qty 10

## 2017-12-17 MED ORDER — SODIUM CHLORIDE 0.9 % IV SOLN
420.0000 mg | Freq: Once | INTRAVENOUS | Status: AC
  Administered 2017-12-17: 420 mg via INTRAVENOUS
  Filled 2017-12-17: qty 14

## 2017-12-17 MED ORDER — HEPARIN SOD (PORK) LOCK FLUSH 100 UNIT/ML IV SOLN
500.0000 [IU] | Freq: Once | INTRAVENOUS | Status: AC | PRN
  Administered 2017-12-17: 500 [IU]
  Filled 2017-12-17: qty 5

## 2017-12-17 MED ORDER — SODIUM CHLORIDE 0.9% FLUSH
10.0000 mL | INTRAVENOUS | Status: DC | PRN
  Administered 2017-12-17: 10 mL
  Filled 2017-12-17: qty 10

## 2017-12-17 MED ORDER — INFLUENZA VAC SPLIT QUAD 0.5 ML IM SUSY
PREFILLED_SYRINGE | INTRAMUSCULAR | Status: AC
  Filled 2017-12-17: qty 0.5

## 2017-12-17 MED ORDER — ACETAMINOPHEN 325 MG PO TABS
650.0000 mg | ORAL_TABLET | Freq: Once | ORAL | Status: AC
  Administered 2017-12-17: 650 mg via ORAL

## 2017-12-17 MED ORDER — DIPHENHYDRAMINE HCL 25 MG PO CAPS
ORAL_CAPSULE | ORAL | Status: AC
  Filled 2017-12-17: qty 2

## 2017-12-17 NOTE — Patient Instructions (Signed)
[facility] Discharge Instructions for Patients Receiving Chemotherapy  Today you received the following chemotherapy agents Herceptin and Perjeta.   To help prevent nausea and vomiting after your treatment, we encourage you to take your nausea medication as directed.   If you develop nausea and vomiting that is not controlled by your nausea medication, call the clinic.   BELOW ARE SYMPTOMS THAT SHOULD BE REPORTED IMMEDIATELY:  *FEVER GREATER THAN 100.5 F  *CHILLS WITH OR WITHOUT FEVER  NAUSEA AND VOMITING THAT IS NOT CONTROLLED WITH YOUR NAUSEA MEDICATION  *UNUSUAL SHORTNESS OF BREATH  *UNUSUAL BRUISING OR BLEEDING  TENDERNESS IN MOUTH AND THROAT WITH OR WITHOUT PRESENCE OF ULCERS  *URINARY PROBLEMS  *BOWEL PROBLEMS  UNUSUAL RASH Items with * indicate a potential emergency and should be followed up as soon as possible.  Feel free to call the clinic should you have any questions or concerns. The clinic phone number is [phone].  Please show the CHEMO ALERT CARD at check-in to the Emergency Department and triage nurse.   

## 2017-12-17 NOTE — Progress Notes (Signed)
Reviewed Dr. Geralyn Flash OV note from yesterday. States we will proceed with Herceptin/Perjeta without Taxotere.

## 2017-12-17 NOTE — Progress Notes (Signed)
Patient was identified on the MST secondary to poor appetite and weight loss.  Patient is a 77 year old female diagnosed with metastatic breast cancer.  She is a patient of Dr. Sonny Dandy.  History includes chemoradiation therapy, GERD, diabetes type 2, depression, and anxiety.  Medications include Xanax, vitamin D, vitamin B12, Femara, magnesium oxide, multivitamin, Prilosec, Zofran, Compazine, and Zoloft  Labs include glucose 189 and albumin 2.9 on November 15.  Height: 5 feet 8 inches. Weight: 163.7 pounds. Usual body weight: 174 pounds in March 2019. BMI: 24.89.  Patient admits she has poor appetite and occasional nausea. She drinks Ensure high-protein at least once daily. She complains of heartburn. Food tastes bland.  She denies mouth sores.  Nutrition diagnosis:  Unintended weight loss related to metastatic cancer as evidenced by 10 pound weight loss in 8 months.  Intervention: Patient was educated to continue small frequent meals and snacks with higher calorie, higher protein foods. Reviewed potential snacks with patient. Educated patient on strategies for improving appetite and nausea. Recommended patient increase Ensure high-protein twice daily.  Coupons were provided. Questions were answered teach back method used.  Contact information given.  Monitoring, evaluation, goals: Patient will tolerate adequate calories and protein to minimize further weight loss.  Next visit: Thursday, December 12 during infusion.  **Disclaimer: This note was dictated with voice recognition software. Similar sounding words can inadvertently be transcribed and this note may contain transcription errors which may not have been corrected upon publication of note.**

## 2017-12-20 ENCOUNTER — Encounter: Payer: Self-pay | Admitting: Hematology and Oncology

## 2017-12-21 ENCOUNTER — Other Ambulatory Visit: Payer: Self-pay

## 2017-12-21 ENCOUNTER — Telehealth: Payer: Self-pay | Admitting: Adult Health

## 2017-12-21 NOTE — Telephone Encounter (Signed)
Scheduled appt per 11/19 sch message.  Pt is aware of appt date and time./

## 2017-12-24 ENCOUNTER — Other Ambulatory Visit: Payer: Medicare HMO

## 2017-12-24 ENCOUNTER — Ambulatory Visit: Payer: Medicare HMO

## 2017-12-25 ENCOUNTER — Other Ambulatory Visit: Payer: Self-pay | Admitting: Cardiology

## 2017-12-31 ENCOUNTER — Inpatient Hospital Stay: Payer: Medicare HMO

## 2017-12-31 VITALS — BP 144/70 | HR 82 | Temp 98.2°F | Resp 16

## 2017-12-31 DIAGNOSIS — M8580 Other specified disorders of bone density and structure, unspecified site: Secondary | ICD-10-CM

## 2017-12-31 DIAGNOSIS — Z5111 Encounter for antineoplastic chemotherapy: Secondary | ICD-10-CM | POA: Diagnosis not present

## 2017-12-31 DIAGNOSIS — Z95828 Presence of other vascular implants and grafts: Secondary | ICD-10-CM

## 2017-12-31 MED ORDER — FULVESTRANT 250 MG/5ML IM SOLN
500.0000 mg | Freq: Once | INTRAMUSCULAR | Status: AC
  Administered 2017-12-31: 500 mg via INTRAMUSCULAR

## 2017-12-31 MED ORDER — FULVESTRANT 250 MG/5ML IM SOLN
INTRAMUSCULAR | Status: AC
  Filled 2017-12-31: qty 10

## 2017-12-31 NOTE — Patient Instructions (Signed)

## 2018-01-03 ENCOUNTER — Telehealth: Payer: Self-pay

## 2018-01-03 NOTE — Telephone Encounter (Signed)
Henderson Newcomer, OT with [facility], calling to get additional orders for home OT, 1 time a week X 4 weeks.  Verbal OK given by Dr. Lindi Adie.  Nurse called orders to [facility].  No further needs.

## 2018-01-04 ENCOUNTER — Other Ambulatory Visit (HOSPITAL_COMMUNITY): Payer: Self-pay

## 2018-01-04 ENCOUNTER — Encounter (HOSPITAL_COMMUNITY): Payer: Self-pay

## 2018-01-04 MED ORDER — AMIODARONE HCL 200 MG PO TABS: 200.0000 mg | ORAL_TABLET | Freq: Every day | ORAL | 3 refills | Status: AC

## 2018-01-06 ENCOUNTER — Telehealth: Payer: Self-pay

## 2018-01-06 NOTE — Telephone Encounter (Signed)
Received call from [facility] physical therapist regarding extending therapy for another 3 weeks for strengthening. Per Dr.Gudena, ok to extend physical therapy at home.

## 2018-01-07 ENCOUNTER — Other Ambulatory Visit: Payer: Medicare HMO

## 2018-01-07 ENCOUNTER — Ambulatory Visit: Payer: Medicare HMO | Admitting: Hematology and Oncology

## 2018-01-07 ENCOUNTER — Ambulatory Visit: Payer: Medicare HMO

## 2018-01-13 ENCOUNTER — Inpatient Hospital Stay: Payer: Medicare HMO | Admitting: Nutrition

## 2018-01-13 ENCOUNTER — Inpatient Hospital Stay: Payer: Medicare HMO

## 2018-01-13 ENCOUNTER — Encounter: Payer: Self-pay | Admitting: Adult Health

## 2018-01-13 ENCOUNTER — Inpatient Hospital Stay: Payer: Medicare HMO | Attending: Hematology and Oncology

## 2018-01-13 ENCOUNTER — Inpatient Hospital Stay (HOSPITAL_BASED_OUTPATIENT_CLINIC_OR_DEPARTMENT_OTHER): Payer: Medicare HMO | Admitting: Adult Health

## 2018-01-13 VITALS — BP 108/56 | HR 58 | Temp 98.0°F

## 2018-01-13 VITALS — BP 110/52 | HR 73 | Temp 98.1°F | Resp 16 | Ht 68.0 in | Wt 164.4 lb

## 2018-01-13 DIAGNOSIS — Z170 Estrogen receptor positive status [ER+]: Secondary | ICD-10-CM | POA: Diagnosis not present

## 2018-01-13 DIAGNOSIS — Z95828 Presence of other vascular implants and grafts: Secondary | ICD-10-CM

## 2018-01-13 DIAGNOSIS — M8580 Other specified disorders of bone density and structure, unspecified site: Secondary | ICD-10-CM

## 2018-01-13 DIAGNOSIS — Z5112 Encounter for antineoplastic immunotherapy: Secondary | ICD-10-CM | POA: Insufficient documentation

## 2018-01-13 DIAGNOSIS — Z79818 Long term (current) use of other agents affecting estrogen receptors and estrogen levels: Secondary | ICD-10-CM | POA: Insufficient documentation

## 2018-01-13 DIAGNOSIS — Z9221 Personal history of antineoplastic chemotherapy: Secondary | ICD-10-CM

## 2018-01-13 DIAGNOSIS — Z9012 Acquired absence of left breast and nipple: Secondary | ICD-10-CM | POA: Diagnosis not present

## 2018-01-13 DIAGNOSIS — C787 Secondary malignant neoplasm of liver and intrahepatic bile duct: Secondary | ICD-10-CM | POA: Insufficient documentation

## 2018-01-13 DIAGNOSIS — Z79899 Other long term (current) drug therapy: Secondary | ICD-10-CM | POA: Diagnosis not present

## 2018-01-13 DIAGNOSIS — C50312 Malignant neoplasm of lower-inner quadrant of left female breast: Secondary | ICD-10-CM

## 2018-01-13 DIAGNOSIS — Z923 Personal history of irradiation: Secondary | ICD-10-CM | POA: Insufficient documentation

## 2018-01-13 LAB — CMP (CANCER CENTER ONLY)
ALT: 41 U/L (ref 0–44)
AST: 46 U/L — ABNORMAL HIGH (ref 15–41)
Albumin: 3.1 g/dL — ABNORMAL LOW (ref 3.5–5.0)
Alkaline Phosphatase: 109 U/L (ref 38–126)
Anion gap: 9 (ref 5–15)
BUN: 16 mg/dL (ref 8–23)
CO2: 27 mmol/L (ref 22–32)
Calcium: 9.6 mg/dL (ref 8.9–10.3)
Chloride: 106 mmol/L (ref 98–111)
Creatinine: 1.15 mg/dL — ABNORMAL HIGH (ref 0.44–1.00)
GFR, Est AFR Am: 53 mL/min — ABNORMAL LOW (ref >60)
GFR, Estimated: 46 mL/min — ABNORMAL LOW (ref >60)
Glucose, Bld: 203 mg/dL — ABNORMAL HIGH (ref 70–99)
Potassium: 4.1 mmol/L (ref 3.5–5.1)
Sodium: 142 mmol/L (ref 135–145)
TOTAL PROTEIN: 6.2 g/dL — AB (ref 6.5–8.1)
Total Bilirubin: 0.5 mg/dL (ref 0.3–1.2)

## 2018-01-13 LAB — CBC WITH DIFFERENTIAL (CANCER CENTER ONLY)
Abs Immature Granulocytes: 0.02 10*3/uL (ref 0.00–0.07)
Basophils Absolute: 0.1 10*3/uL (ref 0.0–0.1)
Basophils Relative: 1 %
Eosinophils Absolute: 0.2 10*3/uL (ref 0.0–0.5)
Eosinophils Relative: 4 %
HCT: 41.8 % (ref 36.0–46.0)
HEMOGLOBIN: 13.5 g/dL (ref 12.0–15.0)
Immature Granulocytes: 0 %
LYMPHS ABS: 1.3 10*3/uL (ref 0.7–4.0)
Lymphocytes Relative: 28 %
MCH: 30.8 pg (ref 26.0–34.0)
MCHC: 32.3 g/dL (ref 30.0–36.0)
MCV: 95.2 fL (ref 80.0–100.0)
Monocytes Absolute: 0.5 10*3/uL (ref 0.1–1.0)
Monocytes Relative: 10 %
Neutro Abs: 2.7 10*3/uL (ref 1.7–7.7)
Neutrophils Relative %: 57 %
Platelet Count: 208 10*3/uL (ref 150–400)
RBC: 4.39 MIL/uL (ref 3.87–5.11)
RDW: 14.8 % (ref 11.5–15.5)
WBC Count: 4.7 10*3/uL (ref 4.0–10.5)
nRBC: 0 % (ref 0.0–0.2)

## 2018-01-13 MED ORDER — SODIUM CHLORIDE 0.9 % IV SOLN
420.0000 mg | Freq: Once | INTRAVENOUS | Status: AC
  Administered 2018-01-13: 420 mg via INTRAVENOUS
  Filled 2018-01-13: qty 14

## 2018-01-13 MED ORDER — FULVESTRANT 250 MG/5ML IM SOLN
500.0000 mg | Freq: Once | INTRAMUSCULAR | Status: AC
  Administered 2018-01-13: 500 mg via INTRAMUSCULAR

## 2018-01-13 MED ORDER — DIPHENHYDRAMINE HCL 25 MG PO CAPS
ORAL_CAPSULE | ORAL | Status: AC
  Filled 2018-01-13: qty 2

## 2018-01-13 MED ORDER — SODIUM CHLORIDE 0.9 % IV SOLN
Freq: Once | INTRAVENOUS | Status: AC
  Administered 2018-01-13: 14:00:00 via INTRAVENOUS
  Filled 2018-01-13: qty 250

## 2018-01-13 MED ORDER — FULVESTRANT 250 MG/5ML IM SOLN
INTRAMUSCULAR | Status: AC
  Filled 2018-01-13: qty 10

## 2018-01-13 MED ORDER — HEPARIN SOD (PORK) LOCK FLUSH 100 UNIT/ML IV SOLN
500.0000 [IU] | Freq: Once | INTRAVENOUS | Status: AC | PRN
  Administered 2018-01-13: 500 [IU]
  Filled 2018-01-13: qty 5

## 2018-01-13 MED ORDER — SODIUM CHLORIDE 0.9% FLUSH
10.0000 mL | Freq: Once | INTRAVENOUS | Status: AC
  Administered 2018-01-13: 10 mL
  Filled 2018-01-13: qty 10

## 2018-01-13 MED ORDER — ACETAMINOPHEN 325 MG PO TABS
650.0000 mg | ORAL_TABLET | Freq: Once | ORAL | Status: AC
  Administered 2018-01-13: 650 mg via ORAL

## 2018-01-13 MED ORDER — SODIUM CHLORIDE 0.9% FLUSH
10.0000 mL | INTRAVENOUS | Status: DC | PRN
  Administered 2018-01-13: 10 mL
  Filled 2018-01-13: qty 10

## 2018-01-13 MED ORDER — TRASTUZUMAB CHEMO 150 MG IV SOLR
450.0000 mg | Freq: Once | INTRAVENOUS | Status: AC
  Administered 2018-01-13: 450 mg via INTRAVENOUS
  Filled 2018-01-13: qty 21.43

## 2018-01-13 MED ORDER — ACETAMINOPHEN 325 MG PO TABS
ORAL_TABLET | ORAL | Status: AC
  Filled 2018-01-13: qty 2

## 2018-01-13 MED ORDER — DIPHENHYDRAMINE HCL 25 MG PO CAPS
50.0000 mg | ORAL_CAPSULE | Freq: Once | ORAL | Status: AC
  Administered 2018-01-13: 50 mg via ORAL

## 2018-01-13 NOTE — Progress Notes (Signed)
[facility] Follow up:    [name], [address] [place] [address]   DIAGNOSIS: Cancer Staging Primary cancer of lower-inner quadrant of left female breast [facility]) Staging form: Breast, AJCC 7th Edition - Clinical: Stage IIB (T3, N0, cM0) - Signed by Haywood Lasso, MD on 03/05/2011 Prognostic indicators: ER 100%  PR 100%  Ki67 18%  Her2 neg 1.31    - Pathologic: Stage IIIA (T3, N2a, cM0) - Signed by Haywood Lasso, MD on 03/25/2011 Prognostic indicators: ER 100%  PR 100%  Ki67 18%  Her2 neg 1.31      SUMMARY OF ONCOLOGIC HISTORY:   Primary cancer of lower-inner quadrant of left female breast ([place])   02/20/2011 Initial Diagnosis    Left Breast invasive lobular carcinoma; 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 Her clinical staging was stage IIB (T3, N0, M0).      03/23/2011 Surgery    L mastectomy + Axillary LND 6 cm ILC with LVI, PNI; 9/16 LN positive with Extracapsular ext ER 100%; PR 100%; Her 2 1.31; Ki 67: 18; pT3a N2a (stage IIIA)    04/17/2011 - 06/19/2011 Chemotherapy    FEC X 4 complicated by febrile neutropenis, spesis and chest wall abscess    10/07/2011 - 11/24/2011 Radiation Therapy    Left chest wall and axilla 5040 cGy in 28 # with Boost    10/16/2011 -  Anti-estrogen oral therapy    Letrozole 2.5 mg po daily    05/27/2015 - 05/30/2015 Hospital Admission    Acute systolic CHF, dilated cardiomyopathy, EF 35-40%    08/09/2015 Surgery    Right lumpectomy: Fibrocystic changes with calcifications, right nipple lesion: Seborrheic keratosis no evidence of malignancy    10/20/2017 Imaging    Elevated LFTs letter ultrasound of the liver which showed liver metastases    11/04/2017 PET scan    Heavy burden of metastatic disease to the liver involving all lobes, index 7.7 centimeters right hepatic lobe mass SUV 16.6, small but hypermetabolic left supraclavicular, left  subpectoral, portacaval, aortocaval adenopathy    11/12/2017 Pathology Results    Liver biopsy: Metastatic breast cancer, ER 100%, PR 0%, Ki-67 10%, HER-2 3+ positive    11/19/2017 -  Chemotherapy    Taxotere, Herceptin, Perjeta,   Taxotere tolerated poorly, discontinued, on Herceptin Perjeta alone     CURRENT THERAPY: Herceptin/Perjeta  INTERVAL HISTORY: [name] 77 y.o. female returns for evalaution prior to receiving Herceptin and Perjeta.  She is doing well with it.  She has had a couple of loose bowel movements.  She denies any prolonged issues with this.  She has some RUQ pain earlier today.  This was brief and happens about once per week.  She notes she also has some issues with gas.     Patient Active Problem List   Diagnosis Date Noted  . Metastatic breast cancer ([place]) 12/03/2017  . Neutropenic fever ([place]) 12/03/2017  . Port-A-Cath in place 12/01/2017  . PVC's (premature ventricular contractions) 05/28/2016  . NICM (nonischemic cardiomyopathy) ([place]) 05/28/2016  . Premature atrial contractions 04/21/2016  . Chronic combined systolic and diastolic CHF (congestive heart failure) ([place]) 07/17/2015  . Dilated cardiomyopathy ([place]) 05/29/2015  . Recurrent major depressive disorder ([place]) 02/28/2015  . Acute bronchitis 12/19/2014  . Acid reflux 06/20/2014  . Herpes 06/20/2014  . 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 H/O malignant neoplasm of breast 03/15/2013  . HLD (hyperlipidemia) 03/15/2013  .  Long term current use of insulin ([place]) 03/15/2013  . 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 Controlled type 2 diabetes mellitus without complication ([place]) 21/19/4174  . Mixed incontinence 03/15/2013  . Macular degeneration 03/02/2013  . LBP (low back pain) 02/23/2013  . Neutropenia with fever ([place]) 06/25/2011  . Open wound of left breast 05/22/2011  . Primary cancer of lower-inner quadrant of left female breast ([place]) 02/20/2011  . DM type 2 (diabetes mellitus,  type 2) ([place]) 08/10/2007  . DEPRESSION 08/10/2007  . HYPERCHOLESTEROLEMIA 12/17/2006  . Essential hypertension 12/17/2006  . GERD 12/17/2006    is allergic to cephalexin; clindamycin; oxycodone-acetaminophen; rocephin [ceftriaxone sodium in dextrose]; sulfonamide derivatives; and dilaudid [hydromorphone hcl].  MEDICAL HISTORY: Past Medical History:  Diagnosis Date  . ALLERGIC RHINITIS 12/13/2007  . ANXIETY 06/04/2008  . Blood transfusion 1964   post childbirth  . 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 Chronic combined systolic (congestive) and diastolic (congestive) heart failure ([place])   . DEGENERATIVE JOINT DISEASE 12/17/2006  . DEPRESSION 08/10/2007  . Dilated cardiomyopathy ([place]) 05/29/2015   a. Dx 05/2015 - EF 35-40% - felt secondary to XRT and chemo for breast CA. Normal coronaries by cath.  . DM type 2 (diabetes mellitus, type 2) ([place])   . Dyspnea    sometimes when walking , sometimes when sitting  . Essential hypertension   . GERD 12/17/2006  . History of blood transfusion    after child birth  . History of chemotherapy   . History of hiatal hernia   . History of radiation therapy 10/07/11-11/24/11   left breast,5040 cGy/28 sessions,l chest wall boost=1000cGy/5 sesions  . 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 Hyperlipidemia   . 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 OBESITY 06/04/2008  . 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 Pneumonia   . Premature atrial contractions   . Sinus bradycardia    a. 2018 event monitor which showed NSR with average HR 71, frequent PACs, occasional PVCs, and sinus bradycardia as low as 50.    SURGICAL HISTORY: Past Surgical History:  Procedure Laterality Date  . bladder tack  1974  . BREAST EXCISIONAL BIOPSY  11/10/1988   left benign Dr Margot Chimes  . BREAST EXCISIONAL BIOPSY  03/04/1983   Right - two areas - Dr Margot Chimes  . BREAST EXCISIONAL BIOPSY  10/09/1980   right - Dr Margot Chimes  . BREAST EXCISIONAL BIOPSY   10/18/1979   left - Dr Margot Chimes  . BREAST EXCISIONAL BIOPSY  01/02/1994   right - Dr Margot Chimes  . BREAST EXCISIONAL BIOPSY  2017   right  . BREAST SURGERY    . CARDIAC CATHETERIZATION N/A 05/29/2015   Procedure: Left Heart Cath and Coronary Angiography;  Surgeon: Belva Crome, MD;  Location: [facility];  Service: Cardiovascular;  Laterality: N/A;  . CHOLECYSTECTOMY  02/23/1991   LC - Dr Margot Chimes  . COLONOSCOPY    . ENTEROCELE REPAIR  06/08   Dr. Cletis Media  . EVACUATION BREAST HEMATOMA  07/01/2011   Procedure: EVACUATION HEMATOMA BREAST;  Surgeon: Haywood Lasso, MD;  Location: [facility];  Service: General;  Laterality: Left;  Drainage of left mastectomy seroma  . EYE SURGERY Right    catartact  . HIP SURGERY Right 08/27/2010   DR. Maureen Ralphs-  "burcitis removal"  . LAMINECTOMY  1993   s/p-Dr. Rita Ohara  . MASTECTOMY Left 03/23/2011  . MODIFIED RADICAL MASTECTOMY W/ AXILLARY  LYMPH NODE DISSECTION Left 03-30-11  . PORT-A-CATH REMOVAL  12/02/2011   Procedure: REMOVAL PORT-A-CATH;  Surgeon: Haywood Lasso, MD;  Location: [facility];  Service: General;  Laterality: N/A;  . PORTACATH PLACEMENT  04/13/2011   Procedure: INSERTION PORT-A-CATH;  Surgeon: Haywood Lasso, MD;  Location: [facility];  Service: General;  Laterality: N/A;  porta cath placement,removal of J-P drain  . PORTACATH PLACEMENT Right 11/25/2017   Procedure: INSERTION PORT-A-CATH WITH [place];  Surgeon: Rolm Bookbinder, MD;  Location: [facility];  Service: General;  Laterality: Right;  . RADIOACTIVE SEED GUIDED EXCISIONAL BREAST BIOPSY Right 08/09/2015   Procedure: RIGHT RADIOACTIVE SEED GUIDED EXCISIONAL BREAST BIOPSY, RIGHT NIPPLE LESION EXCISION;  Surgeon: Rolm Bookbinder, MD;  Location: [facility];  Service: General;  Laterality: Right;  . ROTATOR CUFF REPAIR  8/03   right, Dr. Tonita Cong  . ROTATOR CUFF REPAIR  10/26/2008   left, Dr. Rush Farmer  . TONSILLECTOMY    . VAGINAL HYSTERECTOMY   1974   partial     SOCIAL HISTORY: Social History   Socioeconomic History  . Marital status: Married    Spouse name: Not on file  . Number of children: 3  . Years of education: Not on file  . Highest education level: Not on file  Occupational History    Employer: RETIRED  Social Needs  . Financial resource strain: Not on file  . Food insecurity:    Worry: Not on file    Inability: Not on file  . Transportation needs:    Medical: Not on file    Non-medical: Not on file  Tobacco Use  . Smoking status: Never Smoker  . Smokeless tobacco: Never Used  Substance and Sexual Activity  . Alcohol use: No  . Drug use: No  . Sexual activity: Yes  Lifestyle  . Physical activity:    Days per week: Not on file    Minutes per session: Not on file  . Stress: Not on file  Relationships  . Social connections:    Talks on phone: Not on file    Gets together: Not on file    Attends religious service: Not on file    Active member of club or organization: Not on file    Attends meetings of clubs or organizations: Not on file    Relationship status: Not on file  . Intimate partner violence:    Fear of current or ex partner: Not on file    Emotionally abused: Not on file    Physically abused: Not on file    Forced sexual activity: Not on file  Other Topics Concern  . 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 COPD Mother   . Arthritis Mother   . Emphysema Mother   . Diabetes Brother   . Cancer Brother        prostate  . Cancer Father        malignant brain tumor  . Mental illness Paternal Grandfather   . Heart disease Other        Sibling  . Diabetes Other        Sibling     Review  of Systems  Constitutional: Positive for appetite change,  fatigue and unexpected weight change (weight stable, but she is wearing much more clothing today than prior). Negative for chills and fever.  HENT:   Negative for hearing loss, lump/mass, mouth sores and trouble swallowing.   Eyes: Negative for eye problems and icterus.  Respiratory: Negative for chest tightness, cough and shortness of breath.   Cardiovascular: Negative for chest pain, leg swelling and palpitations.  Gastrointestinal: Negative for abdominal distention, abdominal pain, constipation, diarrhea, nausea and vomiting.  Endocrine: Negative for hot flashes.  Genitourinary: Negative for difficulty urinating.   Musculoskeletal: Negative for arthralgias.  Skin: Negative for itching and rash.  Neurological: Negative for dizziness, extremity weakness, headaches and numbness.  Hematological: Negative for adenopathy. Does not bruise/bleed easily.  Psychiatric/Behavioral: Negative for depression. The patient is not nervous/anxious.       PHYSICAL EXAMINATION  ECOG PERFORMANCE STATUS: 1 - Symptomatic but completely ambulatory  Vitals:   01/13/18 1318  BP: (!) 110/52  Pulse: 73  Resp: 16  Temp: 98.1 F (36.7 C)  SpO2: 96%    Physical Exam Constitutional:      Appearance: Normal appearance.  HENT:     Head: Normocephalic and atraumatic.     Nose: Nose normal.     Mouth/Throat:     Mouth: Mucous membranes are moist.     Pharynx: No oropharyngeal exudate.  Eyes:     General: No scleral icterus.    Pupils: Pupils are equal, round, and reactive to light.  Neck:     Musculoskeletal: Neck supple.  Cardiovascular:     Rate and Rhythm: Normal rate and regular rhythm.     Heart sounds: Normal heart sounds.  Pulmonary:     Effort: Pulmonary effort is normal.     Breath sounds: Normal breath sounds.  Abdominal:     General: Abdomen is flat. Bowel sounds are normal.     Palpations: Abdomen is soft.     Comments: +hepatomegaly  Musculoskeletal:        General: No swelling.   Lymphadenopathy:     Cervical: No cervical adenopathy.  Skin:    General: Skin is warm and dry.     Capillary Refill: Capillary refill takes less than 2 seconds.  Neurological:     General: No focal deficit present.     Mental Status: She is alert and oriented to person, place, and time.     LABORATORY DATA:  CBC    Component Value Date/Time   WBC 4.7 01/13/2018 1224   WBC 25.8 (H) 12/06/2017 0611   RBC 4.39 01/13/2018 1224   HGB 13.5 01/13/2018 1224   HGB 14.4 04/29/2016 1438   HCT 41.8 01/13/2018 1224   HCT 43.2 04/29/2016 1438   PLT 208 01/13/2018 1224   PLT 227 04/29/2016 1438   MCV 95.2 01/13/2018 1224   MCV 89.9 04/29/2016 1438   MCH 30.8 01/13/2018 1224   MCHC 32.3 01/13/2018 1224   RDW 14.8 01/13/2018 1224   RDW 14.1 04/29/2016 1438   LYMPHSABS 1.3 01/13/2018 1224   LYMPHSABS 2.6 04/29/2016 1438   MONOABS 0.5 01/13/2018 1224   MONOABS 0.6 04/29/2016 1438   EOSABS 0.2 01/13/2018 1224   EOSABS 0.1 04/29/2016 1438   BASOSABS 0.1 01/13/2018 1224   BASOSABS 0.1 04/29/2016 1438    CMP     Component Value Date/Time   NA 142 01/13/2018 1224   NA 142 04/29/2016 1438   K 4.1 01/13/2018 1224   K  4.1 04/29/2016 1438   CL 106 01/13/2018 1224   CL 102 02/25/2012 0908   CO2 27 01/13/2018 1224   CO2 31 (H) 04/29/2016 1438   GLUCOSE 203 (H) 01/13/2018 1224   GLUCOSE 93 04/29/2016 1438   GLUCOSE 232 (H) 02/25/2012 0908   BUN 16 01/13/2018 1224   BUN 14.4 04/29/2016 1438   CREATININE 1.15 (H) 01/13/2018 1224   CREATININE 0.8 04/29/2016 1438   CALCIUM 9.6 01/13/2018 1224   CALCIUM 10.3 04/29/2016 1438   PROT 6.2 (L) 01/13/2018 1224   PROT 7.0 04/29/2016 1438   ALBUMIN 3.1 (L) 01/13/2018 1224   ALBUMIN 3.7 04/29/2016 1438   AST 46 (H) 01/13/2018 1224   AST 15 04/29/2016 1438   ALT 41 01/13/2018 1224   ALT 15 04/29/2016 1438   ALKPHOS 109 01/13/2018 1224   ALKPHOS 127 04/29/2016 1438   BILITOT 0.5 01/13/2018 1224   BILITOT 0.53 04/29/2016 1438   GFRNONAA  46 (L) 01/13/2018 1224   GFRAA 53 (L) 01/13/2018 1224          ASSESSMENT and THERAPY PLAN:   Primary cancer of lower-inner quadrant of left female breast ([place]) Left breast invasive lobular cancer diagnosed 02/20/2011 status post left mastectomy with axillary lymph node dissection, 6 cm ILC with LVI, PNI, 9/16 lymph nodes positive with extracapsular extension, ER 100%, PR 100%, HER-2 -1.31, Ki-67 18%, T3a N2 a M0 stage IIIa status post F AC chemotherapy 4 followed by radiation and currently on letrozole 2.5 mg daily since 10/16/2011  Relapse/recurrence: Elevated LFTs led to ultrasound of the liver which revealed liver metastases September 2019  PET CT scan 11/04/2017: Heavy burden of metastatic disease to the liver involving all lobes, largest 7.7 x 5.4 cm with SUV of 16.6, additional hypermetabolic left supraclavicular, left subpectoral, portacaval and aortocaval lymphadenopathy  Liver biopsy: 11/17/2017: Metastatic breast cancer ER 100%, PR 0%, HER-2 3+ positive CARIS molecular testing 11/12/2017: Revealed PIK3CA mutation, androgen receptor positivity, TMB 7 Mut/Mb  Potential future treatment options: CDK 4 and 6 inhibitors and Alpelisib  Taxotere given once on 11/26/2017 and subsequently discontinued due to patient's inability to tolerate   Current treatment: .Systemic chemotherapyHerceptin and Perjeta (held Herceptin and Perjeta initially because of low cardiac ejection fraction cleared for use by Dr. Haroldine Laws) and Faslodex  [name] is doing well today.  Her labs are stable.  She is tolerating herceptin well.  I referred her to Dr. Lawana Chambers at her request.  She will meet with nutrition today about her weight loss.    [name] wants to know why she is getting treatment scheduled every 4 weeks and not every 3, like she thought it should be.  She also wants to know when she is having scans again.  I told her scans would likely be after a few more treatments, but that it is  ultimately Dr. Geralyn Flash decision (who is out of office this week), therefore I will ask him when he returns back to the office next week.  She understands that she will wait to hear from [place].        Orders Placed This Encounter  Procedures  . Ambulatory referral to Dentistry    Referral Priority:   Urgent    Referral Type:   Consultation    Referral Reason:   Specialty Services Required    Requested Specialty:   Dental General Practice    Number of Visits Requested:   1    All questions were answered. The patient knows to call  the clinic with any problems, questions or concerns. We can certainly see the patient much sooner if necessary.  A total of (20) minutes of face-to-face time was spent with this patient with greater than 50% of that time in counseling and care-coordination.  This note was electronically signed. Scot Dock, NP 01/13/2018

## 2018-01-13 NOTE — Progress Notes (Signed)
Nutrition follow-up completed with patient receiving treatment for metastatic breast cancer. Patient's weight is stable and was documented as 164.4 pounds. Patient does complain of nausea occasionally. She is getting tired of Ensure nutrition supplements. Husband feels patient does not eat enough.  Nutrition diagnosis: Unintended weight loss improved.  Intervention: Provided support and encouragement to continue small frequent meals and snacks utilizing high-calorie high-protein foods. Reviewed good choices for adding calories and fat such as avocados and nuts. Patient agreeable to increasing oral nutrition supplements twice daily.  Provided coupons. Questions were answered teach back method used.  Monitoring, evaluation, goals: Patient will tolerate adequate calories and protein to minimize weight loss.  Next visit: To be scheduled as needed.  **Disclaimer: This note was dictated with voice recognition software. Similar sounding words can inadvertently be transcribed and this note may contain transcription errors which may not have been corrected upon publication of note.**

## 2018-01-13 NOTE — Patient Instructions (Signed)
[facility] Discharge Instructions for Patients Receiving Chemotherapy  Today you received the following chemotherapy agents Herceptin and Perjeta.   To help prevent nausea and vomiting after your treatment, we encourage you to take your nausea medication as directed.   If you develop nausea and vomiting that is not controlled by your nausea medication, call the clinic.   BELOW ARE SYMPTOMS THAT SHOULD BE REPORTED IMMEDIATELY:  *FEVER GREATER THAN 100.5 F  *CHILLS WITH OR WITHOUT FEVER  NAUSEA AND VOMITING THAT IS NOT CONTROLLED WITH YOUR NAUSEA MEDICATION  *UNUSUAL SHORTNESS OF BREATH  *UNUSUAL BRUISING OR BLEEDING  TENDERNESS IN MOUTH AND THROAT WITH OR WITHOUT PRESENCE OF ULCERS  *URINARY PROBLEMS  *BOWEL PROBLEMS  UNUSUAL RASH Items with * indicate a potential emergency and should be followed up as soon as possible.  Feel free to call the clinic should you have any questions or concerns. The clinic phone number is [phone].  Please show the CHEMO ALERT CARD at check-in to the Emergency Department and triage nurse.   

## 2018-01-13 NOTE — Assessment & Plan Note (Addendum)
Left breast invasive lobular cancer diagnosed 02/20/2011 status post left mastectomy with axillary lymph node dissection, 6 cm ILC with LVI, PNI, 9/16 lymph nodes positive with extracapsular extension, ER 100%, PR 100%, HER-2 -1.31, Ki-67 18%, T3a N2 a M0 stage IIIa status post F AC chemotherapy 4 followed by radiation and currently on letrozole 2.5 mg daily since 10/16/2011  Relapse/recurrence: Elevated LFTs led to ultrasound of the liver which revealed liver metastases September 2019  PET CT scan 11/04/2017: Heavy burden of metastatic disease to the liver involving all lobes, largest 7.7 x 5.4 cm with SUV of 16.6, additional hypermetabolic left supraclavicular, left subpectoral, portacaval and aortocaval lymphadenopathy  Liver biopsy: 11/17/2017: Metastatic breast cancer ER 100%, PR 0%, HER-2 3+ positive CARIS molecular testing 11/12/2017: Revealed PIK3CA mutation, androgen receptor positivity, TMB 7 Mut/Mb  Potential future treatment options: CDK 4 and 6 inhibitors and Alpelisib  Taxotere given once on 11/26/2017 and subsequently discontinued due to patient's inability to tolerate   Current treatment: .Systemic chemotherapyHerceptin and Perjeta (held Herceptin and Perjeta initially because of low cardiac ejection fraction cleared for use by Dr. Haroldine Laws) and Faslodex  Kady is doing well today.  Her labs are stable.  She is tolerating herceptin well.  I referred her to Dr. Lawana Chambers at her request.  She will meet with nutrition today about her weight loss.    Delania wants to know why she is getting treatment scheduled every 4 weeks and not every 3, like she thought it should be.  She also wants to know when she is having scans again.  I told her scans would likely be after a few more treatments, but that it is ultimately Dr. Geralyn Flash decision (who is out of office this week), therefore I will ask him when he returns back to the office next week.  She understands that she will wait to  hear from [place].

## 2018-01-14 ENCOUNTER — Telehealth: Payer: Self-pay | Admitting: Adult Health

## 2018-01-14 NOTE — Telephone Encounter (Signed)
Per 12/12 no los °

## 2018-01-19 ENCOUNTER — Ambulatory Visit (HOSPITAL_COMMUNITY): Payer: Self-pay | Admitting: Dentistry

## 2018-01-19 ENCOUNTER — Encounter (HOSPITAL_COMMUNITY): Payer: Self-pay | Admitting: Dentistry

## 2018-01-19 VITALS — BP 124/80 | HR 83 | Temp 97.9°F

## 2018-01-19 DIAGNOSIS — K06010 Localized gingival recession, unspecified: Secondary | ICD-10-CM

## 2018-01-19 DIAGNOSIS — K083 Retained dental root: Secondary | ICD-10-CM

## 2018-01-19 DIAGNOSIS — K029 Dental caries, unspecified: Secondary | ICD-10-CM

## 2018-01-19 DIAGNOSIS — K08409 Partial loss of teeth, unspecified cause, unspecified class: Secondary | ICD-10-CM

## 2018-01-19 DIAGNOSIS — K0530 Chronic periodontitis, unspecified: Secondary | ICD-10-CM

## 2018-01-19 DIAGNOSIS — K030 Excessive attrition of teeth: Secondary | ICD-10-CM

## 2018-01-19 DIAGNOSIS — C50312 Malignant neoplasm of lower-inner quadrant of left female breast: Secondary | ICD-10-CM

## 2018-01-19 DIAGNOSIS — K036 Deposits [accretions] on teeth: Secondary | ICD-10-CM

## 2018-01-19 DIAGNOSIS — M264 Malocclusion, unspecified: Secondary | ICD-10-CM

## 2018-01-19 DIAGNOSIS — K0850 Unsatisfactory restoration of tooth, unspecified: Secondary | ICD-10-CM

## 2018-01-19 DIAGNOSIS — C50919 Malignant neoplasm of unspecified site of unspecified female breast: Secondary | ICD-10-CM

## 2018-01-19 DIAGNOSIS — K0401 Reversible pulpitis: Secondary | ICD-10-CM

## 2018-01-19 DIAGNOSIS — M278 Other specified diseases of jaws: Secondary | ICD-10-CM

## 2018-01-19 DIAGNOSIS — M270 Developmental disorders of jaws: Secondary | ICD-10-CM

## 2018-01-19 MED ORDER — AMOXICILLIN 500 MG PO CAPS: ORAL_CAPSULE | ORAL | 1 refills | Status: AC

## 2018-01-19 NOTE — Progress Notes (Signed)
DENTAL CONSULTATION  Date of Consultation:  01/19/2018 Patient Name:   [name] Date of Birth:   May 26, 1940 Medical Record Number: [id number]  VITALS: BP 124/80 (BP Location: Right Arm)   Pulse 83   Temp 97.9 F (36.6 C)   CHIEF COMPLAINT: Patient referred by Dr. Lindi Adie for a dental consultation.  HPI: [name] this is 77 year old female with history of metastatic breast cancer. Patient is currently undergoing active chemotherapy with Dr. Lindi Adie. Patient is now being referred to Dental Medicine for evaluation of a "lost tooth".  The patient is actually complaining of tooth sensitivity associated with a lost dental restoration from tooth #6. Patient complains of dull achy intermittent pain that reaches intensity of 2 out of 10 but is currently 0 out of 10. This intermittent pain lasts for minutes only. This pain primarily occurs when food gets into the" hole in the back of my tooth".  The pain is relieved when the food is removed. Patient indicates that she is been experiencing discomfort since she was recently diagnosed with metastatic breast cancer in October of 2019. Patient last saw the dentist approximately 2 to 3 years ago to have a tooth pulled. This was by Dr. Allyson Sabal at [place] office off of [facility] [address].  Patient indicates that she did have a dry socket associated with that dental extraction. Patient denies having any partial dentures. 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   PROBLEM LIST: Patient Active Problem List   Diagnosis Date Noted  . Primary cancer of lower-inner quadrant of left female breast ([place]) 02/20/2011    Priority: Medium  . Metastatic breast cancer ([name]) 12/03/2017  . Neutropenic fever ([place]) 12/03/2017  . Port-A-Cath in place 12/01/2017  . PVC's (premature ventricular contractions) 05/28/2016  . NICM (nonischemic cardiomyopathy) ([place]) 05/28/2016  . Premature atrial contractions 04/21/2016  . Chronic combined systolic and  diastolic CHF (congestive heart failure) ([place]) 07/17/2015  . Dilated cardiomyopathy ([place]) 05/29/2015  . Recurrent major depressive disorder ([place]) 02/28/2015  . Acute bronchitis 12/19/2014  . Acid reflux 06/20/2014  . Herpes 06/20/2014  . 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 H/O malignant neoplasm of breast 03/15/2013  . HLD (hyperlipidemia) 03/15/2013  . Long term current use of insulin ([place]) 03/15/2013  . 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 Controlled type 2 diabetes mellitus without complication ([place]) 76/19/5093  . Mixed incontinence 03/15/2013  . Macular degeneration 03/02/2013  . LBP (low back pain) 02/23/2013  . Neutropenia with fever ([place]) 06/25/2011  . Open wound of left breast 05/22/2011  . DM type 2 (diabetes mellitus, type 2) ([place]) 08/10/2007  . DEPRESSION 08/10/2007  . HYPERCHOLESTEROLEMIA 12/17/2006  . Essential hypertension 12/17/2006  . GERD 12/17/2006    PMH: Past Medical History:  Diagnosis Date  . ALLERGIC RHINITIS 12/13/2007  . ANXIETY 06/04/2008  . Blood transfusion 1964   post childbirth  . 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 Chronic combined systolic (congestive) and diastolic (congestive) heart failure ([place])   . DEGENERATIVE JOINT DISEASE 12/17/2006  . DEPRESSION 08/10/2007  . Dilated cardiomyopathy ([place]) 05/29/2015   a. Dx 05/2015 - EF 35-40% - felt secondary to XRT and chemo for breast CA. Normal coronaries by cath.  . DM type 2 (diabetes mellitus, type 2) ([place])   . Dyspnea    sometimes when walking , sometimes when sitting  . Essential hypertension   . GERD 12/17/2006  .  History of blood transfusion    after child birth  . History of chemotherapy   . History of hiatal hernia   . History of radiation therapy 10/07/11-11/24/11   left breast,5040 cGy/28 sessions,l chest wall boost=1000cGy/5 sesions  . 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 Hyperlipidemia   . 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 OBESITY 06/04/2008  .  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 Pneumonia   . Premature atrial contractions   . Sinus bradycardia    a. 2018 event monitor which showed NSR with average HR 71, frequent PACs, occasional PVCs, and sinus bradycardia as low as 50.    PSH: Past Surgical History:  Procedure Laterality Date  . bladder tack  1974  . BREAST EXCISIONAL BIOPSY  11/10/1988   left benign Dr Margot Chimes  . BREAST EXCISIONAL BIOPSY  03/04/1983   Right - two areas - Dr Margot Chimes  . BREAST EXCISIONAL BIOPSY  10/09/1980   right - Dr Margot Chimes  . BREAST EXCISIONAL BIOPSY  10/18/1979   left - Dr Margot Chimes  . BREAST EXCISIONAL BIOPSY  01/02/1994   right - Dr Margot Chimes  . BREAST EXCISIONAL BIOPSY  2017   right  . BREAST SURGERY    . CARDIAC CATHETERIZATION N/A 05/29/2015   Procedure: Left Heart Cath and Coronary Angiography;  Surgeon: Belva Crome, MD;  Location: [place] [facility];  Service: Cardiovascular;  Laterality: N/A;  . CHOLECYSTECTOMY  02/23/1991   LC - Dr Margot Chimes  . COLONOSCOPY    . ENTEROCELE REPAIR  06/08   Dr. Cletis Media  . EVACUATION BREAST HEMATOMA  07/01/2011   Procedure: EVACUATION HEMATOMA BREAST;  Surgeon: Haywood Lasso, MD;  Location: [facility];  Service: General;  Laterality: Left;  Drainage of left mastectomy seroma  . EYE SURGERY Right    catartact  . HIP SURGERY Right 08/27/2010   DR. Maureen Ralphs-  "burcitis removal"  . LAMINECTOMY  1993   s/p-Dr. Rita Ohara  . MASTECTOMY Left 03/23/2011  . MODIFIED RADICAL MASTECTOMY W/ AXILLARY LYMPH NODE DISSECTION Left 03-30-11  . PORT-A-CATH REMOVAL  12/02/2011   Procedure: REMOVAL PORT-A-CATH;  Surgeon: Haywood Lasso, MD;  Location: [place];  Service: General;  Laterality: N/A;  . PORTACATH PLACEMENT  04/13/2011   Procedure: INSERTION PORT-A-CATH;  Surgeon: Haywood Lasso, MD;  Location: [place];  Service: General;  Laterality: N/A;  porta cath placement,removal of J-P drain  . PORTACATH PLACEMENT Right 11/25/2017    Procedure: INSERTION PORT-A-CATH WITH [place];  Surgeon: Rolm Bookbinder, MD;  Location: [place];  Service: General;  Laterality: Right;  . RADIOACTIVE SEED GUIDED EXCISIONAL BREAST BIOPSY Right 08/09/2015   Procedure: RIGHT RADIOACTIVE SEED GUIDED EXCISIONAL BREAST BIOPSY, RIGHT NIPPLE LESION EXCISION;  Surgeon: Rolm Bookbinder, MD;  Location: [place];  Service: General;  Laterality: Right;  . ROTATOR CUFF REPAIR  8/03   right, Dr. Tonita Cong  . ROTATOR CUFF REPAIR  10/26/2008   left, Dr. Rush Farmer  . TONSILLECTOMY    . VAGINAL HYSTERECTOMY  1974   partial     ALLERGIES: Allergies  Allergen Reactions  . Cephalexin Hives    Patient indicates that she has used amoxicillin without hives or other allergic reaction.  . Clindamycin Hives  . Oxycodone-Acetaminophen Hives and Itching  . Rocephin [Ceftriaxone Sodium In Dextrose] Hives  . Sulfonamide Derivatives Hives  . Dilaudid [Hydromorphone Hcl] Itching    MEDICATIONS: Current Outpatient Medications  Medication Sig Dispense Refill  . acyclovir (ZOVIRAX) 200  MG capsule TAKE 2 CAPSULES BY MOUTH 3 TIMES DAILY 540 capsule 3  . ALPRAZolam (XANAX) 0.5 MG tablet TAKE ONE-HALF TO ONE TABLET BY MOUTH THREE TIMES DAILY AS NEEDED FOR  NERVES 270 tablet 1  . amiodarone (PACERONE) 200 MG tablet Take 1 tablet (200 mg total) by mouth daily. 30 tablet 3  . carvedilol (COREG) 6.25 MG tablet TAKE 1 TABLET TWICE DAILY WITH MEALS 180 tablet 3  . cholecalciferol (VITAMIN D) 1000 UNITS tablet Take 1 tablet (1,000 Units total) by mouth daily. 90 tablet 3  . cyanocobalamin 500 MCG tablet Take 1 tablet (500 mcg total) by mouth daily. 90 tablet 3  . letrozole (FEMARA) 2.5 MG tablet     . lidocaine-prilocaine (EMLA) cream Apply to affected area once 30 g 3  . liraglutide (VICTOZA) 18 MG/3ML SOPN Inject 0.3 mLs (1.8 mg total) into the skin daily.    Marland Kitchen losartan (COZAAR) 25 MG tablet Take 0.5 tablets (12.5 mg total) by mouth at bedtime. 15 tablet 6  .  magnesium oxide (MAG-OX) 400 MG tablet Take 1 tablet (400 mg total) by mouth daily. (Patient taking differently: Take 400 mg by mouth daily. Take 2 daily) 30 tablet 0  . Multiple Vitamins-Minerals (ONE-A-DAY WOMENS 50+ ADVANTAGE) TABS Take 1 tablet by mouth daily. 90 tablet 3  . omeprazole (PRILOSEC) 40 MG capsule Take 40 mg by mouth daily.    . ondansetron (ZOFRAN) 8 MG tablet Take 1 tablet (8 mg total) by mouth 2 (two) times daily as needed for refractory nausea / vomiting. 30 tablet 1  . sertraline (ZOLOFT) 50 MG tablet Take 1 tablet (50 mg total) by mouth daily. 90 tablet 3  . spironolactone (ALDACTONE) 25 MG tablet Take 0.5 tablets (12.5 mg total) by mouth every Monday, Wednesday, and Friday. 30 tablet 6  . amoxicillin (AMOXIL) 500 MG capsule Take four capsules one hour before dental appointment. 4 capsule 1  . fluconazole (DIFLUCAN) 100 MG tablet Take 1 tablet (100 mg total) by mouth daily. (Patient not taking: Reported on 01/19/2018) 7 tablet 0  . HYDROcodone-acetaminophen (NORCO) 10-325 MG tablet Take 1 tablet by mouth every 6 (six) hours as needed. (Patient not taking: Reported on 01/19/2018) 5 tablet 0  . Insulin Pen Needle (RELION PEN NEEDLE 31G/8MM) 31G X 8 MM MISC 1 Device by Does not apply route 4 (four) times daily. 120 each 11  . magic mouthwash w/lidocaine SOLN Take 5 mLs by mouth 3 (three) times daily as needed for mouth pain. (Patient not taking: Reported on 01/19/2018) 50 mL 0  . nystatin (MYCOSTATIN) 100000 UNIT/ML suspension Take 5 mLs by mouth 4 (four) times daily.  0  . prochlorperazine (COMPAZINE) 10 MG tablet Take 1 tablet (10 mg total) by mouth every 6 (six) hours as needed (Nausea or vomiting). (Patient not taking: Reported on 01/19/2018) 30 tablet 1   No current facility-administered medications for this visit.     LABS: Lab Results  Component Value Date   WBC 4.7 01/13/2018   HGB 13.5 01/13/2018   HCT 41.8 01/13/2018   MCV 95.2 01/13/2018   PLT 208 01/13/2018       Component Value Date/Time   NA 142 01/13/2018 1224   NA 142 04/29/2016 1438   K 4.1 01/13/2018 1224   K 4.1 04/29/2016 1438   CL 106 01/13/2018 1224   CL 102 02/25/2012 0908   CO2 27 01/13/2018 1224   CO2 31 (H) 04/29/2016 1438   GLUCOSE 203 (H) 01/13/2018 1224  GLUCOSE 93 04/29/2016 1438   GLUCOSE 232 (H) 02/25/2012 0908   BUN 16 01/13/2018 1224   BUN 14.4 04/29/2016 1438   CREATININE 1.15 (H) 01/13/2018 1224   CREATININE 0.8 04/29/2016 1438   CALCIUM 9.6 01/13/2018 1224   CALCIUM 10.3 04/29/2016 1438   GFRNONAA 46 (L) 01/13/2018 1224   GFRAA 53 (L) 01/13/2018 1224   Lab Results  Component Value Date   INR 0.92 11/15/2017   INR 0.94 11/12/2017   INR 1.04 05/29/2015   No results found for: PTT  SOCIAL HISTORY: Social History   Socioeconomic History  . Marital status: Married    Spouse name: Not on file  . Number of children: 3  . Years of education: Not on file  . Highest education level: Not on file  Occupational History    Employer: RETIRED  Social Needs  . Financial resource strain: Not on file  . Food insecurity:    Worry: Not on file    Inability: Not on file  . Transportation needs:    Medical: Not on file    Non-medical: Not on file  Tobacco Use  . Smoking status: Never Smoker  . Smokeless tobacco: Never Used  Substance and Sexual Activity  . Alcohol use: No  . Drug use: No  . Sexual activity: Yes  Lifestyle  . Physical activity:    Days per week: Not on file    Minutes per session: Not on file  . Stress: Not on file  Relationships  . Social connections:    Talks on phone: Not on file    Gets together: Not on file    Attends religious service: Not on file    Active member of club or organization: Not on file    Attends meetings of clubs or organizations: Not on file    Relationship status: Not on file  . Intimate partner violence:    Fear of current or ex partner: Not on file    Emotionally abused: Not on file    Physically  abused: Not on file    Forced sexual activity: Not on file  Other Topics Concern  . 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 COPD Mother   . Arthritis Mother   . Emphysema Mother   . Diabetes Brother   . Cancer Brother        prostate  . Cancer Father        malignant brain tumor  . Mental illness Paternal Grandfather   . Heart disease Other        Sibling  . Diabetes Other        Sibling     REVIEW OF SYSTEMS: Reviewed with the patient as per History of present illness. 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  DENTAL HISTORY: CHIEF COMPLAINT: Patient referred by Dr. Lindi Adie for a dental consultation.  HPI: LEALA BRYAND this is 77 year old female with history of metastatic breast cancer. Patient is currently undergoing active chemotherapy with Dr. Lindi Adie. Patient is now being referred to Dental Medicine for evaluation of a "lost tooth".  The patient is actually complaining of tooth sensitivity associated with a lost  dental restoration from tooth #6. Patient complains of dull achy intermittent pain that reaches intensity of 2 out of 10 but is currently 0 out of 10. This intermittent pain lasts for minutes only. This pain primarily occurs when food gets into the" hole in the back of my tooth".  The pain is relieved when the food is removed. Patient indicates that she is been experiencing discomfort since she was recently diagnosed with metastatic breast cancer in October of 2019. Patient last saw the dentist approximately 2 to 3 years ago to have a tooth pulled. This was by Dr. Allyson Sabal at [place] office off of [facility] [address].  Patient indicates that she did have a dry socket associated with that dental extraction. Patient  denies having any partial dentures. 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  DENTAL EXAMINATION: GENERAL:  The patient is a well-developed, well-nourished female in no acute distress. Patient has alopecia secondary to the chemotherapy. HEAD AND NECK:  There is no palpable neck lymphadenopathy. Patient denies acute TMJ symptoms. INTRAORAL EXAM:  The patient has normal saliva. There is no evidence of oral abscess formation. The patient has buccal exostosis in the area of tooth numbers 1-2. Patient has small bilateral mandibular lingual tori. DENTITION:  Patient is missing tooth numbers 1, 13 -17, 19, 21, 29, and 32. There is a retained root segment in the area of #2 below the level of the gum. The patient also has a retained root segment in the area of tooth #4. PERIODONTAL:  The patient has chronic periodontitis with plaque and calculus accumulations, gingival recession, and incipient to moderate bone loss. DENTAL CARIES/SUBOPTIMAL RESTORATIONS: The patient has multiple dental caries and suboptimal dental restorations as per dental charting form. The caries on tooth #6 is extensive and appears to have pulpal involvement. ENDODONTIC:  The patient is complaining of intermittent pulpitis symptoms involving tooth #6. There does appear to be pulpal involvement by the carious lesion. Patient has had previous root canal therapies associated with tooth numbers 3, 4, 27, and 28. The root canal therapy of tooth number 4 is now exposed to the oral environment. CROWN AND BRIDGE:  The patient has multiple crown and bridge restorations. Patient has multiple margins of the crown and bridge therapy that are suboptimal and are affected by poor margins and recurrent caries. PROSTHODONTIC: The patient denies having partial dentures. OCCLUSION: The patient has a poor occlusal scheme secondary to multiple missing teeth, multiple retained root segments, and lack of replacement of missing teeth with dental  prostheses.  RADIOGRAPHIC INTERPRETATION: An orthopantogram was taken and supplemented with a full series of dental radiographs. There are multiple missing teeth. There are multiple retained root segments. There are multiple dental caries. The dental caries on tooth #6 appears to be impinging on the pulp. There are no obvious periapical radiolucencies noted. There are multiple suboptimal dental restorations especially associated with the crown and bridge therapy. The patient has decreased trabecular bone pattern consistent with osteopenia involving the maxilla and mandible. There is questionable internal or external resorption associated with tooth #24.  ASSESSMENTS: 1. Metastatic breast cancer 2. Active chemotherapy with Dr. Lindi Adie 3. Port-A-Cath in place 4. History of acute pulpitis symptoms of tooth #6 5. Multiple dental caries 6. Multiple suboptimal dental restorations 7. Bilateral mandibular lingual tori 8. Chronic periodontitis of bone loss 9. Gingival recession. 10. Accretions 11. Questionable internal or external resorption associated with tooth #24 12. Multiple missing teeth  13. Poor occlusal scheme and malocclusion 14. Need for antibiotic premedication prior to invasive dental procedures due to presence of Port-A-Cath per Dr. Lindi Adie.  PLAN/RECOMMENDATIONS: 1. I discussed the risks, benefits, and complications of various treatment options with the patient in relationship to her medical and dental conditions.  We specifically addressed tooth #6 at this time at the request of the patient. Treatment options include no treatment, extraction of tooth #6 with an oral surgeon, root canal therapy with an endodontist followed by post and core and crown with the dentist of her choice.  The patient currently wishes to proceed with extraction of tooth #6 with Dr. Allyson Sabal at [place].  The patient refuses referral to an oral surgeon. The patient will then follow-up  with a dentist of her choice for continued dental care as indicated. The patient is aware that Dr. Lindi Adie has recommended antibiotic premedication prior to invasive dental procedures due to the presence of the Port-A-Cath. The patient has had allergic reactions to clindamycin and cephalosporins but is able to take amoxicillin therapy by her report. A prescription for amoxicillin therapy was written for the patient. Patient is to take four capsules of amoxicillin 500 mg one hour prior to dental appointment. Dr. Lindi Adie was contacted today and indicates the patient is stable for dental extraction procedure at this time.  2. Discussion of findings with medical team and coordination of future medical and dental care as needed.  I spent in excess of  120 minutes during the conduct of this consultation and >50% of this time involved direct face-to-face encounter for counseling and/or coordination of the patient's care.    Lenn Cal, DDS

## 2018-01-20 NOTE — Patient Instructions (Signed)
The patient is to contact [facility] and schedule a dental extraction procedure with Dr. Allyson Sabal. Patient is to take for capsules of amoxicillin 500 mg pne hour prior to the invasive dental procedures. This is at the recommendation of Dr. Lindi Adie. The patient is to then follow-up with the dentist of her choice for additional comprehensive dental care. The patient is to complete a release of information to allow Dental Medicine to send records to the new dentist of her choice. Dr. Enrique Sack

## 2018-01-25 ENCOUNTER — Telehealth: Payer: Self-pay

## 2018-01-25 NOTE — Telephone Encounter (Signed)
Returned patient's call.  Patient c/o "just not feeling well over past couple days."  Patient denies any fever, chest pain, or shortness of breath.  Patient requesting to see Dr. Lindi Adie.  Patient reports all over pain, mainly around rib cage and RUQ.  Patient scheduled with Dr. Lindi Adie per request on 01/27/2018.  Informed patient any major changes such as chest pain, fever, or shortness of breath utilize ED.  Voiced understanding.

## 2018-01-27 ENCOUNTER — Telehealth: Payer: Self-pay | Admitting: Hematology and Oncology

## 2018-01-27 ENCOUNTER — Inpatient Hospital Stay (HOSPITAL_BASED_OUTPATIENT_CLINIC_OR_DEPARTMENT_OTHER): Payer: Medicare HMO | Admitting: Hematology and Oncology

## 2018-01-27 ENCOUNTER — Inpatient Hospital Stay: Payer: Medicare HMO

## 2018-01-27 VITALS — BP 129/71 | HR 76 | Temp 98.4°F | Resp 18 | Ht 68.0 in | Wt 166.7 lb

## 2018-01-27 DIAGNOSIS — C787 Secondary malignant neoplasm of liver and intrahepatic bile duct: Secondary | ICD-10-CM | POA: Diagnosis not present

## 2018-01-27 DIAGNOSIS — Z9012 Acquired absence of left breast and nipple: Secondary | ICD-10-CM | POA: Diagnosis not present

## 2018-01-27 DIAGNOSIS — Z5112 Encounter for antineoplastic immunotherapy: Secondary | ICD-10-CM | POA: Diagnosis not present

## 2018-01-27 DIAGNOSIS — Z79899 Other long term (current) drug therapy: Secondary | ICD-10-CM

## 2018-01-27 DIAGNOSIS — Z170 Estrogen receptor positive status [ER+]: Secondary | ICD-10-CM

## 2018-01-27 DIAGNOSIS — Z95828 Presence of other vascular implants and grafts: Secondary | ICD-10-CM

## 2018-01-27 DIAGNOSIS — M8580 Other specified disorders of bone density and structure, unspecified site: Secondary | ICD-10-CM

## 2018-01-27 DIAGNOSIS — C50312 Malignant neoplasm of lower-inner quadrant of left female breast: Secondary | ICD-10-CM | POA: Diagnosis not present

## 2018-01-27 DIAGNOSIS — Z9221 Personal history of antineoplastic chemotherapy: Secondary | ICD-10-CM

## 2018-01-27 DIAGNOSIS — Z923 Personal history of irradiation: Secondary | ICD-10-CM

## 2018-01-27 DIAGNOSIS — C50919 Malignant neoplasm of unspecified site of unspecified female breast: Secondary | ICD-10-CM

## 2018-01-27 MED ORDER — FULVESTRANT 250 MG/5ML IM SOLN
INTRAMUSCULAR | Status: AC
  Filled 2018-01-27: qty 10

## 2018-01-27 MED ORDER — FULVESTRANT 250 MG/5ML IM SOLN
500.0000 mg | Freq: Once | INTRAMUSCULAR | Status: AC
  Administered 2018-01-27: 500 mg via INTRAMUSCULAR

## 2018-01-27 NOTE — Telephone Encounter (Signed)
R/s appt per 12/26 los - gave patient AVS and calender

## 2018-01-27 NOTE — Patient Instructions (Signed)

## 2018-01-27 NOTE — Progress Notes (Signed)
Patient Care Team: Vicenta Aly, FNP as PCP - General (Nurse Practitioner) Neldon Mc, MD as Surgeon (General Surgery) Sueanne Margarita, MD as Consulting Physician (Cardiology)  DIAGNOSIS:  Encounter Diagnoses  Name Primary?  . Primary cancer of lower-inner quadrant of left female breast ([place])   . 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 Her clinical staging was stage IIB (T3, N0, M0).      03/23/2011 Surgery    L mastectomy + Axillary LND 6 cm ILC with LVI, PNI; 9/16 LN positive with Extracapsular ext ER 100%; PR 100%; Her 2 1.31; Ki 67: 18; pT3a N2a (stage IIIA)    04/17/2011 - 06/19/2011 Chemotherapy    FEC X 4 complicated by febrile neutropenis, spesis and chest wall abscess    10/07/2011 - 11/24/2011 Radiation Therapy    Left chest wall and axilla 5040 cGy in 28 # with Boost    10/16/2011 -  Anti-estrogen oral therapy    Letrozole 2.5 mg po daily    05/27/2015 - 05/30/2015 Hospital Admission    Acute systolic CHF, dilated cardiomyopathy, EF 35-40%    08/09/2015 Surgery    Right lumpectomy: Fibrocystic changes with calcifications, right nipple lesion: Seborrheic keratosis no evidence of malignancy    10/20/2017 Imaging    Elevated LFTs letter ultrasound of the liver which showed liver metastases    11/04/2017 PET scan    Heavy burden of metastatic disease to the liver involving all lobes, index 7.7 centimeters right hepatic lobe mass SUV 16.6, small but hypermetabolic left supraclavicular, left subpectoral, portacaval, aortocaval adenopathy    11/12/2017 Pathology Results    Liver biopsy: Metastatic breast cancer, ER 100%, PR 0%, Ki-67 10%, HER-2 3+ positive    11/19/2017 -  Chemotherapy    Taxotere, Herceptin, Perjeta,    Taxotere tolerated poorly, discontinued, on Herceptin Perjeta alone     CHIEF COMPLIANT: Follow-up of metastatic breast cancer to discuss her treatment plan  INTERVAL HISTORY: [name] is a 77 year old with above-mentioned history metastatic breast cancer is currently on Herceptin Perjeta and Faslodex.  She is tolerating these treatments fairly well.  She does not have any diarrhea.  She does have intermittent abdominal pain especially right upper quadrant but can be between pain scale of 5-10.  She has not had any such pain of the last couple of days.  Her appetite is poor and therefore she has not been eating as much.  Denies any nausea or vomiting.  REVIEW OF SYSTEMS:   Constitutional: Denies fevers, chills or abnormal weight loss Eyes: Denies blurriness of vision Ears, nose, mouth, throat, and face: Denies mucositis or sore throat Respiratory: Denies cough, dyspnea or wheezes Cardiovascular: Denies palpitation, chest discomfort Gastrointestinal: Abdominal pain Skin: Denies abnormal skin rashes Lymphatics: Denies new lymphadenopathy or easy bruising Neurological:Denies numbness, tingling or new weaknesses Behavioral/Psych: Mood is stable, no new changes  Extremities: No lower extremity edema  All other systems were reviewed with the patient and are negative.  I have reviewed the past medical history, past surgical history, social history and family history with the patient and they are unchanged from previous note.  ALLERGIES:  is allergic to cephalexin; clindamycin; oxycodone-acetaminophen; rocephin [ceftriaxone sodium in dextrose]; sulfonamide derivatives; and dilaudid [hydromorphone hcl].  MEDICATIONS:  Current Outpatient Medications  Medication Sig Dispense Refill  . acyclovir (ZOVIRAX) 200 MG capsule TAKE 2 CAPSULES BY MOUTH 3 TIMES DAILY 540 capsule 3  . ALPRAZolam (XANAX) 0.5 MG tablet TAKE ONE-HALF TO ONE TABLET BY MOUTH THREE TIMES DAILY AS NEEDED FOR  NERVES  270 tablet 1  . amiodarone (PACERONE) 200 MG tablet Take 1 tablet (200 mg total) by mouth daily. 30 tablet 3  . amoxicillin (AMOXIL) 500 MG capsule Take four capsules one hour before dental appointment. 4 capsule 1  . carvedilol (COREG) 6.25 MG tablet TAKE 1 TABLET TWICE DAILY WITH MEALS 180 tablet 3  . cholecalciferol (VITAMIN D) 1000 UNITS tablet Take 1 tablet (1,000 Units total) by mouth daily. 90 tablet 3  . cyanocobalamin 500 MCG tablet Take 1 tablet (500 mcg total) by mouth daily. 90 tablet 3  . fluconazole (DIFLUCAN) 100 MG tablet Take 1 tablet (100 mg total) by mouth daily. (Patient not taking: Reported on 01/19/2018) 7 tablet 0  . HYDROcodone-acetaminophen (NORCO) 10-325 MG tablet Take 1 tablet by mouth every 6 (six) hours as needed. (Patient not taking: Reported on 01/19/2018) 5 tablet 0  . Insulin Pen Needle (RELION PEN NEEDLE 31G/8MM) 31G X 8 MM MISC 1 Device by Does not apply route 4 (four) times daily. 120 each 11  . letrozole (FEMARA) 2.5 MG tablet     . lidocaine-prilocaine (EMLA) cream Apply to affected area once 30 g 3  . liraglutide (VICTOZA) 18 MG/3ML SOPN Inject 0.3 mLs (1.8 mg total) into the skin daily.    Marland Kitchen losartan (COZAAR) 25 MG tablet Take 0.5 tablets (12.5 mg total) by mouth at bedtime. 15 tablet 6  . magic mouthwash w/lidocaine SOLN Take 5 mLs by mouth 3 (three) times daily as needed for mouth pain. (Patient not taking: Reported on 01/19/2018) 50 mL 0  . magnesium oxide (MAG-OX) 400 MG tablet Take 1 tablet (400 mg total) by mouth daily. (Patient taking differently: Take 400 mg by mouth daily. Take 2 daily) 30 tablet 0  . Multiple Vitamins-Minerals (ONE-A-DAY WOMENS 50+ ADVANTAGE) TABS Take 1 tablet by mouth daily. 90 tablet 3  . nystatin (MYCOSTATIN) 100000 UNIT/ML suspension Take 5 mLs by mouth 4 (four) times daily.  0  . omeprazole (PRILOSEC) 40 MG capsule Take 40 mg by mouth daily.    . ondansetron (ZOFRAN) 8 MG tablet Take 1 tablet (8 mg total) by mouth 2 (two)  times daily as needed for refractory nausea / vomiting. 30 tablet 1  . prochlorperazine (COMPAZINE) 10 MG tablet Take 1 tablet (10 mg total) by mouth every 6 (six) hours as needed (Nausea or vomiting). (Patient not taking: Reported on 01/19/2018) 30 tablet 1  . sertraline (ZOLOFT) 50 MG tablet Take 1 tablet (50 mg total) by mouth daily. 90 tablet 3  . spironolactone (ALDACTONE) 25 MG tablet Take 0.5 tablets (12.5 mg total) by mouth every Monday, Wednesday, and Friday. 30 tablet 6   No current facility-administered medications for this visit.     PHYSICAL EXAMINATION: ECOG PERFORMANCE STATUS: 1 - Symptomatic but completely ambulatory  Vitals:   01/27/18 1519  BP: 129/71  Pulse: 76  Resp: 18  Temp: 98.4 F (36.9 C)  SpO2: 98%   Filed Weights   01/27/18 1519  Weight: 166 lb 11.2 oz (75.6 kg)    GENERAL:alert, no distress and comfortable SKIN: skin color, texture, turgor are normal, no rashes or significant lesions EYES: normal, Conjunctiva are pink and non-injected, sclera clear OROPHARYNX:no exudate, no erythema and lips, buccal mucosa,  and tongue normal  NECK: supple, thyroid normal size, non-tender, without nodularity LYMPH:  no palpable lymphadenopathy in the cervical, axillary or inguinal LUNGS: clear to auscultation and percussion with normal breathing effort HEART: regular rate & rhythm and no murmurs and no lower extremity edema ABDOMEN:abdomen soft, non-tender and normal bowel sounds MUSCULOSKELETAL:no cyanosis of digits and no clubbing  NEURO: alert & oriented x 3 with fluent speech, no focal motor/sensory deficits EXTREMITIES: No lower extremity edema   LABORATORY DATA:  I have reviewed the data as listed CMP Latest Ref Rng & Units 01/13/2018 12/17/2017 12/13/2017  Glucose 70 - 99 mg/dL 203(H) 189(H) 118(H)  BUN 8 - 23 mg/dL '16 17 20  '$ Creatinine 0.44 - 1.00 mg/dL 1.15(H) 0.81 0.97  Sodium 135 - 145 mmol/L 142 142 140  Potassium 3.5 - 5.1 mmol/L 4.1 3.8 4.2   Chloride 98 - 111 mmol/L 106 108 103  CO2 22 - 32 mmol/L '27 25 28  '$ Calcium 8.9 - 10.3 mg/dL 9.6 9.7 10.8(H)  Total Protein 6.5 - 8.1 g/dL 6.2(L) 5.8(L) 6.7  Total Bilirubin 0.3 - 1.2 mg/dL 0.5 0.4 0.4  Alkaline Phos 38 - 126 U/L 109 103 122  AST 15 - 41 U/L 46(H) 44(H) 45(H)  ALT 0 - 44 U/L 41 33 34    Lab Results  Component Value Date   WBC 4.7 01/13/2018   HGB 13.5 01/13/2018   HCT 41.8 01/13/2018   MCV 95.2 01/13/2018   PLT 208 01/13/2018   NEUTROABS 2.7 01/13/2018    ASSESSMENT & PLAN:  Primary cancer of lower-inner quadrant of left female breast ([place]) Left breast invasive lobular cancer diagnosed 02/20/2011 status post left mastectomy with axillary lymph node dissection, 6 cm ILC with LVI, PNI, 9/16 lymph nodes positive with extracapsular extension, ER 100%, PR 100%, HER-2 -1.31, Ki-67 18%, T3a N2 a M0 stage IIIa status post F AC chemotherapy 4 followed by radiation and currently on letrozole 2.5 mg daily since 10/16/2011  Relapse/recurrence: Elevated LFTs led to ultrasound of the liver which revealed liver metastases September 2019  PET CT scan 11/04/2017: Heavy burden of metastatic disease to the liver involving all lobes, largest 7.7 x 5.4 cm with SUV of 16.6, additional hypermetabolic left supraclavicular, left subpectoral, portacaval and aortocaval lymphadenopathy  Liver biopsy: 11/17/2017: Metastatic breast cancer ER 100%, PR 0%, HER-2 3+ positive CARIS molecular testing 11/12/2017: Revealed PIK3CA mutation, androgen receptor positivity, TMB 7 Mut/Mb  Potential future treatment options: CDK 4 and 6 inhibitors and Alpelisib -------------------------------------------------------------------------------------------------------------------------------------------- Current treatment: .Systemic chemotherapyHerceptin and Perjeta (held Herceptin and Perjeta initially because of low cardiac ejection fraction cleared for use by Dr. Haroldine Laws) and Faslodex started  12/17/2017 (Taxotere was given 1025 20191 dose discontinued due to patient's inability to tolerate)  Rationale for every 4-week Herceptin Perjeta: Faslodex has to be given every 4 weeks and therefore Herceptin and Perjeta were switched to every 4 weeks. Toxicities: 1.  Intermittent nausea 2.  Right upper quadrant abdominal discomfort: It has resolved 3.  Elevated LFTs: Much improved  Radiology: I would like to obtain scans in February before 03/17/2017 Return to clinic every 4 weeks for Herceptin and Perjeta and Faslodex.   Orders Placed This Encounter  Procedures  . CT Abdomen Pelvis W Contrast    Standing Status:   Future    Standing Expiration Date:   01/27/2019    Order Specific Question:   ** REASON FOR EXAM (FREE TEXT)    Answer:   Met breast cancer restaging  Order Specific Question:   If indicated for the ordered procedure, I authorize the administration of contrast media per Radiology protocol    Answer:   Yes    Order Specific Question:   Preferred imaging location?    Answer:   [facility]    Order Specific Question:   Is Oral Contrast requested for this exam?    Answer:   Yes, Per Radiology protocol    Order Specific Question:   Radiology Contrast Protocol - do NOT remove file path    Answer:   [email or web address]  . CT Chest W Contrast    Standing Status:   Future    Standing Expiration Date:   01/27/2019    Order Specific Question:   ** REASON FOR EXAM (FREE TEXT)    Answer:   Met breast cancer restaging    Order Specific Question:   If indicated for the ordered procedure, I authorize the administration of contrast media per Radiology protocol    Answer:   Yes    Order Specific Question:   Preferred imaging location?    Answer:   [facility]    Order Specific Question:   Radiology Contrast Protocol - do NOT remove file path    Answer:   [email or web address]   The patient has a good understanding  of the overall plan. she agrees with it. she will call with any problems that may develop before the next visit here.   Harriette Ohara, MD 01/27/18

## 2018-01-27 NOTE — Assessment & Plan Note (Signed)
Left breast invasive lobular cancer diagnosed 02/20/2011 status post left mastectomy with axillary lymph node dissection, 6 cm ILC with LVI, PNI, 9/16 lymph nodes positive with extracapsular extension, ER 100%, PR 100%, HER-2 -1.31, Ki-67 18%, T3a N2 a M0 stage IIIa status post F AC chemotherapy 4 followed by radiation and currently on letrozole 2.5 mg daily since 10/16/2011  Relapse/recurrence: Elevated LFTs led to ultrasound of the liver which revealed liver metastases September 2019  PET CT scan 11/04/2017: Heavy burden of metastatic disease to the liver involving all lobes, largest 7.7 x 5.4 cm with SUV of 16.6, additional hypermetabolic left supraclavicular, left subpectoral, portacaval and aortocaval lymphadenopathy  Liver biopsy: 11/17/2017: Metastatic breast cancer ER 100%, PR 0%, HER-2 3+ positive CARIS molecular testing 11/12/2017: Revealed PIK3CA mutation, androgen receptor positivity, TMB 7 Mut/Mb  Potential future treatment options: CDK 4 and 6 inhibitors and Alpelisib -------------------------------------------------------------------------------------------------------------------------------------------- Current treatment: .Systemic chemotherapyHerceptin and Perjeta (held Herceptin and Perjeta initially because of low cardiac ejection fraction cleared for use by Dr. Haroldine Laws) and Faslodex started 12/17/2017 (Taxotere was given 1025 20191 dose discontinued due to patient's inability to tolerate)  Rationale for every 4-week Herceptin Perjeta: Faslodex has to be given every 4 weeks and therefore Herceptin and Perjeta were switched to every 4 weeks.  Radiology: I would like to obtain scans after 3 months of treatment.

## 2018-01-28 ENCOUNTER — Other Ambulatory Visit: Payer: Medicare HMO

## 2018-01-28 ENCOUNTER — Ambulatory Visit: Payer: Medicare HMO | Admitting: Hematology and Oncology

## 2018-01-28 ENCOUNTER — Ambulatory Visit: Payer: Medicare HMO

## 2018-02-09 NOTE — Progress Notes (Signed)
Patient Care Team: Vicenta Aly, FNP as PCP - General (Nurse Practitioner) Neldon Mc, MD as Surgeon (General Surgery) Sueanne Margarita, MD as Consulting Physician (Cardiology)  DIAGNOSIS:    ICD-10-CM   1. Primary cancer of lower-inner quadrant of left female breast ([place]) C50.312     SUMMARY OF ONCOLOGIC HISTORY:   Primary cancer of lower-inner quadrant of left female breast ([place])   02/20/2011 Initial Diagnosis    Left Breast invasive lobular carcinoma; 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 Her clinical staging was stage IIB (T3, N0, M0).      03/23/2011 Surgery    L mastectomy + Axillary LND 6 cm ILC with LVI, PNI; 9/16 LN positive with Extracapsular ext ER 100%; PR 100%; Her 2 1.31; Ki 67: 18; pT3a N2a (stage IIIA)    04/17/2011 - 06/19/2011 Chemotherapy    FEC X 4 complicated by febrile neutropenis, spesis and chest wall abscess    10/07/2011 - 11/24/2011 Radiation Therapy    Left chest wall and axilla 5040 cGy in 28 # with Boost    10/16/2011 -  Anti-estrogen oral therapy    Letrozole 2.5 mg po daily    05/27/2015 - 05/30/2015 Hospital Admission    Acute systolic CHF, dilated cardiomyopathy, EF 35-40%    08/09/2015 Surgery    Right lumpectomy: Fibrocystic changes with calcifications, right nipple lesion: Seborrheic keratosis no evidence of malignancy    10/20/2017 Imaging    Elevated LFTs letter ultrasound of the liver which showed liver metastases    11/04/2017 PET scan    Heavy burden of metastatic disease to the liver involving all lobes, index 7.7 centimeters right hepatic lobe mass SUV 16.6, small but hypermetabolic left supraclavicular, left subpectoral, portacaval, aortocaval adenopathy    11/12/2017 Pathology Results    Liver biopsy: Metastatic breast cancer, ER 100%, PR 0%, Ki-67 10%, HER-2 3+ positive    11/19/2017 -  Chemotherapy    Taxotere, Herceptin, Perjeta,   Taxotere tolerated poorly, discontinued, on  Herceptin Perjeta alone     CHIEF COMPLIANT: Follow-up of metastatic breast cancer  INTERVAL HISTORY: [name] is a 78 y.o. with above-mentioned history of metastatic breast cancer is currently on monthly Herceptin, Perjeta, and Faslodex.  She presents to the clinic today with her husband. She has pain in her left shoulder and swelling of her left arm. She denies any diarrhea or constipation. She will get a PET scan before her next appointment in 03/2018.   REVIEW OF SYSTEMS:   Constitutional: Denies fevers, chills or abnormal weight loss Eyes: Denies blurriness of vision Ears, nose, mouth, throat, and face: Denies mucositis or sore throat Respiratory: Denies cough, dyspnea or wheezes Cardiovascular: Denies palpitation, chest discomfort Gastrointestinal:  Denies nausea, heartburn or change in bowel habits Skin: Denies abnormal skin rashes MSK: (+) pain in left shoulder Lymphatics: Denies new lymphadenopathy or easy bruising Neurological:Denies numbness, tingling or new weaknesses Behavioral/Psych: Mood is stable, no new changes  Extremities: (+) swelling left arm  Breast: denies any pain or lumps or nodules in either breasts All other systems were reviewed with the patient and are negative.  I have reviewed the past medical history, past surgical history, social history and family history with the patient and they are unchanged from previous note.  ALLERGIES:  is allergic to cephalexin; clindamycin; oxycodone-acetaminophen; rocephin [ceftriaxone sodium in dextrose]; sulfonamide derivatives; and dilaudid [hydromorphone hcl].  MEDICATIONS:  Current Outpatient Medications  Medication Sig Dispense Refill  . acyclovir (ZOVIRAX) 200 MG capsule  TAKE 2 CAPSULES BY MOUTH 3 TIMES DAILY 540 capsule 3  . ALPRAZolam (XANAX) 0.5 MG tablet TAKE ONE-HALF TO ONE TABLET BY MOUTH THREE TIMES DAILY AS NEEDED FOR  NERVES 270 tablet 1  . amiodarone (PACERONE) 200 MG tablet Take 1 tablet (200 mg  total) by mouth daily. 30 tablet 3  . amoxicillin (AMOXIL) 500 MG capsule Take four capsules one hour before dental appointment. 4 capsule 1  . carvedilol (COREG) 6.25 MG tablet TAKE 1 TABLET TWICE DAILY WITH MEALS 180 tablet 3  . cholecalciferol (VITAMIN D) 1000 UNITS tablet Take 1 tablet (1,000 Units total) by mouth daily. 90 tablet 3  . cyanocobalamin 500 MCG tablet Take 1 tablet (500 mcg total) by mouth daily. 90 tablet 3  . fluconazole (DIFLUCAN) 100 MG tablet Take 1 tablet (100 mg total) by mouth daily. (Patient not taking: Reported on 01/19/2018) 7 tablet 0  . HYDROcodone-acetaminophen (NORCO) 10-325 MG tablet Take 1 tablet by mouth every 6 (six) hours as needed. (Patient not taking: Reported on 01/19/2018) 5 tablet 0  . Insulin Pen Needle (RELION PEN NEEDLE 31G/8MM) 31G X 8 MM MISC 1 Device by Does not apply route 4 (four) times daily. 120 each 11  . letrozole (FEMARA) 2.5 MG tablet     . lidocaine-prilocaine (EMLA) cream Apply to affected area once 30 g 3  . liraglutide (VICTOZA) 18 MG/3ML SOPN Inject 0.3 mLs (1.8 mg total) into the skin daily.    Marland Kitchen losartan (COZAAR) 25 MG tablet Take 0.5 tablets (12.5 mg total) by mouth at bedtime. 15 tablet 6  . magic mouthwash w/lidocaine SOLN Take 5 mLs by mouth 3 (three) times daily as needed for mouth pain. (Patient not taking: Reported on 01/19/2018) 50 mL 0  . magnesium oxide (MAG-OX) 400 MG tablet Take 1 tablet (400 mg total) by mouth daily. (Patient taking differently: Take 400 mg by mouth daily. Take 2 daily) 30 tablet 0  . Multiple Vitamins-Minerals (ONE-A-DAY WOMENS 50+ ADVANTAGE) TABS Take 1 tablet by mouth daily. 90 tablet 3  . nystatin (MYCOSTATIN) 100000 UNIT/ML suspension Take 5 mLs by mouth 4 (four) times daily.  0  . omeprazole (PRILOSEC) 40 MG capsule Take 40 mg by mouth daily.    . ondansetron (ZOFRAN) 8 MG tablet Take 1 tablet (8 mg total) by mouth 2 (two) times daily as needed for refractory nausea / vomiting. 30 tablet 1  .  prochlorperazine (COMPAZINE) 10 MG tablet Take 1 tablet (10 mg total) by mouth every 6 (six) hours as needed (Nausea or vomiting). (Patient not taking: Reported on 01/19/2018) 30 tablet 1  . sertraline (ZOLOFT) 50 MG tablet Take 1 tablet (50 mg total) by mouth daily. 90 tablet 3  . spironolactone (ALDACTONE) 25 MG tablet Take 0.5 tablets (12.5 mg total) by mouth every Monday, Wednesday, and Friday. 30 tablet 6   No current facility-administered medications for this visit.     PHYSICAL EXAMINATION: ECOG PERFORMANCE STATUS: 1 - Symptomatic but completely ambulatory  Vitals:   02/10/18 0933  BP: 133/78  Pulse: 72  Resp: 17  Temp: 97.8 F (36.6 C)  SpO2: 98%   Filed Weights   02/10/18 0933  Weight: 166 lb 1.6 oz (75.3 kg)    GENERAL:alert, no distress and comfortable SKIN: skin color, texture, turgor are normal, no rashes or significant lesions EYES: normal, Conjunctiva are pink and non-injected, sclera clear OROPHARYNX:no exudate, no erythema and lips, buccal mucosa, and tongue normal  NECK: supple, thyroid normal size, non-tender, without  nodularity LYMPH:  no palpable lymphadenopathy in the cervical, axillary or inguinal LUNGS: clear to auscultation and percussion with normal breathing effort HEART: regular rate & rhythm and no murmurs and no lower extremity edema ABDOMEN:abdomen soft, non-tender and normal bowel sounds MUSCULOSKELETAL:no cyanosis of digits and no clubbing  NEURO: alert & oriented x 3 with fluent speech, no focal motor/sensory deficits EXTREMITIES: No lower extremity edema  LABORATORY DATA:  I have reviewed the data as listed CMP Latest Ref Rng & Units 02/10/2018 01/13/2018 12/17/2017  Glucose 70 - 99 mg/dL 110(H) 203(H) 189(H)  BUN 8 - 23 mg/dL _0 Creatinine 0.44 - 1.00 mg/dL 0.83 1.15(H) 0.81  Sodium 135 - 145 mmol/L 142 142 142  Potassium 3.5 - 5.1 mmol/L 4.2 4.1 3.8  Chloride 98 - 111 mmol/L 106 106 108  CO2 22 - 32 mmol/L _1 Calcium 8.9  - 10.3 mg/dL 9.9 9.6 9.7  Total Protein 6.5 - 8.1 g/dL 6.3(L) 6.2(L) 5.8(L)  Total Bilirubin 0.3 - 1.2 mg/dL 0.6 0.5 0.4  Alkaline Phos 38 - 126 U/L 125 109 103  AST 15 - 41 U/L 58(H) 46(H) 44(H)  ALT 0 - 44 U/L 47(H) 41 33    Lab Results  Component Value Date   WBC 4.4 02/10/2018   HGB 14.7 02/10/2018   HCT 45.8 02/10/2018   MCV 93.5 02/10/2018   PLT 196 02/10/2018   NEUTROABS 2.2 02/10/2018    ASSESSMENT & PLAN:  Primary cancer of lower-inner quadrant of left female breast ([place]) Left breast invasive lobular cancer diagnosed 02/20/2011 status post left mastectomy with axillary lymph node dissection, 6 cm ILC with LVI, PNI, 9/16 lymph nodes positive with extracapsular extension, ER 100%, PR 100%, HER-2 -1.31, Ki-67 18%, T3a N2 a M0 stage IIIa status post F AC chemotherapy 4 followed by radiation and currently on letrozole 2.5 mg daily since 10/16/2011  Relapse/recurrence: Elevated LFTs led to ultrasound of the liver which revealed liver metastases September 2019  PET CT scan 11/04/2017: Heavy burden of metastatic disease to the liver involving all lobes, largest 7.7 x 5.4 cm with SUV of 16.6, additional hypermetabolic left supraclavicular, left subpectoral, portacaval and aortocaval lymphadenopathy  Liver biopsy: 11/17/2017: Metastatic breast cancer ER 100%, PR 0%, HER-2 3+ positive CARIS molecular testing 11/12/2017:Revealed PIK3CA mutation, androgen receptor positivity, TMB 7 Mut/Mb  Potential future treatment options: CDK 4 and 6 inhibitors and Alpelisib; as well as other novel anti-HER-2 drugs like Enhertu -------------------------------------------------------------------------------------------------------------------------------------------- Current treatment: .Systemic chemotherapyHerceptin and Perjeta (held Herceptin and Perjeta initiallybecause of low cardiac ejection fractioncleared for use by Dr. Marzetta Board Faslodex started 12/17/2017 (Taxotere was given 1025  20191 dose discontinued due to patient's inability to tolerate)  Toxicities: Denies any adverse effects Plan to obtain PET CT scan in February and follow-up after that.  No orders of the defined types were placed in this encounter.  The patient has a good understanding of the overall plan. she agrees with it. she will call with any problems that may develop before the next visit here.  Nicholas Lose, MD 02/10/2018   I, Molly Dorshimer, am acting as scribe for Nicholas Lose, MD.  I have reviewed the above documentation for accuracy and completeness, and I agree with the above.

## 2018-02-10 ENCOUNTER — Other Ambulatory Visit: Payer: Medicare HMO

## 2018-02-10 ENCOUNTER — Inpatient Hospital Stay: Payer: Medicare HMO

## 2018-02-10 ENCOUNTER — Ambulatory Visit: Payer: Medicare HMO | Admitting: Adult Health

## 2018-02-10 ENCOUNTER — Inpatient Hospital Stay (HOSPITAL_BASED_OUTPATIENT_CLINIC_OR_DEPARTMENT_OTHER): Payer: Medicare HMO | Admitting: Hematology and Oncology

## 2018-02-10 ENCOUNTER — Inpatient Hospital Stay: Payer: Medicare HMO | Attending: Hematology and Oncology

## 2018-02-10 ENCOUNTER — Ambulatory Visit: Payer: Medicare HMO

## 2018-02-10 ENCOUNTER — Other Ambulatory Visit: Payer: Self-pay | Admitting: Hematology and Oncology

## 2018-02-10 VITALS — BP 111/63 | HR 60 | Resp 16

## 2018-02-10 DIAGNOSIS — Z95828 Presence of other vascular implants and grafts: Secondary | ICD-10-CM

## 2018-02-10 DIAGNOSIS — I890 Lymphedema, not elsewhere classified: Secondary | ICD-10-CM | POA: Insufficient documentation

## 2018-02-10 DIAGNOSIS — Z9221 Personal history of antineoplastic chemotherapy: Secondary | ICD-10-CM | POA: Diagnosis not present

## 2018-02-10 DIAGNOSIS — Z923 Personal history of irradiation: Secondary | ICD-10-CM | POA: Insufficient documentation

## 2018-02-10 DIAGNOSIS — M8580 Other specified disorders of bone density and structure, unspecified site: Secondary | ICD-10-CM

## 2018-02-10 DIAGNOSIS — C787 Secondary malignant neoplasm of liver and intrahepatic bile duct: Secondary | ICD-10-CM | POA: Diagnosis not present

## 2018-02-10 DIAGNOSIS — C50312 Malignant neoplasm of lower-inner quadrant of left female breast: Secondary | ICD-10-CM

## 2018-02-10 DIAGNOSIS — C50919 Malignant neoplasm of unspecified site of unspecified female breast: Secondary | ICD-10-CM

## 2018-02-10 DIAGNOSIS — Z5111 Encounter for antineoplastic chemotherapy: Secondary | ICD-10-CM | POA: Insufficient documentation

## 2018-02-10 DIAGNOSIS — Z5112 Encounter for antineoplastic immunotherapy: Secondary | ICD-10-CM | POA: Insufficient documentation

## 2018-02-10 LAB — CBC WITH DIFFERENTIAL (CANCER CENTER ONLY)
Abs Immature Granulocytes: 0.01 10*3/uL (ref 0.00–0.07)
BASOS ABS: 0.1 10*3/uL (ref 0.0–0.1)
Basophils Relative: 1 %
Eosinophils Absolute: 0.2 10*3/uL (ref 0.0–0.5)
Eosinophils Relative: 4 %
HCT: 45.8 % (ref 36.0–46.0)
HEMOGLOBIN: 14.7 g/dL (ref 12.0–15.0)
Immature Granulocytes: 0 %
Lymphocytes Relative: 30 %
Lymphs Abs: 1.3 10*3/uL (ref 0.7–4.0)
MCH: 30 pg (ref 26.0–34.0)
MCHC: 32.1 g/dL (ref 30.0–36.0)
MCV: 93.5 fL (ref 80.0–100.0)
Monocytes Absolute: 0.6 10*3/uL (ref 0.1–1.0)
Monocytes Relative: 14 %
Neutro Abs: 2.2 10*3/uL (ref 1.7–7.7)
Neutrophils Relative %: 51 %
Platelet Count: 196 10*3/uL (ref 150–400)
RBC: 4.9 MIL/uL (ref 3.87–5.11)
RDW: 14.5 % (ref 11.5–15.5)
WBC Count: 4.4 10*3/uL (ref 4.0–10.5)
nRBC: 0 % (ref 0.0–0.2)

## 2018-02-10 LAB — CMP (CANCER CENTER ONLY)
ALT: 47 U/L — AB (ref 0–44)
AST: 58 U/L — ABNORMAL HIGH (ref 15–41)
Albumin: 3.2 g/dL — ABNORMAL LOW (ref 3.5–5.0)
Alkaline Phosphatase: 125 U/L (ref 38–126)
Anion gap: 9 (ref 5–15)
BUN: 13 mg/dL (ref 8–23)
CO2: 27 mmol/L (ref 22–32)
CREATININE: 0.83 mg/dL (ref 0.44–1.00)
Calcium: 9.9 mg/dL (ref 8.9–10.3)
Chloride: 106 mmol/L (ref 98–111)
GFR, Estimated: >60 mL/min (ref >60)
Glucose, Bld: 110 mg/dL — ABNORMAL HIGH (ref 70–99)
Potassium: 4.2 mmol/L (ref 3.5–5.1)
Sodium: 142 mmol/L (ref 135–145)
Total Bilirubin: 0.6 mg/dL (ref 0.3–1.2)
Total Protein: 6.3 g/dL — ABNORMAL LOW (ref 6.5–8.1)

## 2018-02-10 MED ORDER — HEPARIN SOD (PORK) LOCK FLUSH 100 UNIT/ML IV SOLN
500.0000 [IU] | Freq: Once | INTRAVENOUS | Status: DC | PRN
  Filled 2018-02-10: qty 5

## 2018-02-10 MED ORDER — ACETAMINOPHEN 325 MG PO TABS
ORAL_TABLET | ORAL | Status: AC
  Filled 2018-02-10: qty 2

## 2018-02-10 MED ORDER — DIPHENHYDRAMINE HCL 25 MG PO CAPS
50.0000 mg | ORAL_CAPSULE | Freq: Once | ORAL | Status: AC
  Administered 2018-02-10: 50 mg via ORAL

## 2018-02-10 MED ORDER — TRASTUZUMAB CHEMO 150 MG IV SOLR
450.0000 mg | Freq: Once | INTRAVENOUS | Status: AC
  Administered 2018-02-10: 450 mg via INTRAVENOUS
  Filled 2018-02-10: qty 21.43

## 2018-02-10 MED ORDER — SODIUM CHLORIDE 0.9 % IV SOLN
420.0000 mg | Freq: Once | INTRAVENOUS | Status: AC
  Administered 2018-02-10: 420 mg via INTRAVENOUS
  Filled 2018-02-10: qty 14

## 2018-02-10 MED ORDER — SODIUM CHLORIDE 0.9% FLUSH
10.0000 mL | INTRAVENOUS | Status: DC | PRN
  Filled 2018-02-10: qty 10

## 2018-02-10 MED ORDER — ACETAMINOPHEN 325 MG PO TABS
650.0000 mg | ORAL_TABLET | Freq: Once | ORAL | Status: AC
  Administered 2018-02-10: 650 mg via ORAL

## 2018-02-10 MED ORDER — ALTEPLASE 2 MG IJ SOLR
2.0000 mg | Freq: Once | INTRAMUSCULAR | Status: AC
  Administered 2018-02-10: 2 mg
  Filled 2018-02-10: qty 2

## 2018-02-10 MED ORDER — DIPHENHYDRAMINE HCL 25 MG PO CAPS
ORAL_CAPSULE | ORAL | Status: AC
  Filled 2018-02-10: qty 2

## 2018-02-10 MED ORDER — ALTEPLASE 2 MG IJ SOLR
INTRAMUSCULAR | Status: AC
  Filled 2018-02-10: qty 2

## 2018-02-10 MED ORDER — SODIUM CHLORIDE 0.9% FLUSH
10.0000 mL | Freq: Once | INTRAVENOUS | Status: AC
  Administered 2018-02-10: 10 mL
  Filled 2018-02-10: qty 10

## 2018-02-10 MED ORDER — SODIUM CHLORIDE 0.9 % IV SOLN
Freq: Once | INTRAVENOUS | Status: DC
  Filled 2018-02-10: qty 250

## 2018-02-10 MED ORDER — FULVESTRANT 250 MG/5ML IM SOLN: 500.0000 mg | Freq: Once | INTRAMUSCULAR | Status: DC

## 2018-02-10 NOTE — Patient Instructions (Signed)
[facility] Discharge Instructions for Patients Receiving Chemotherapy  Today you received the following chemotherapy agents Herceptin and Perjeta.   To help prevent nausea and vomiting after your treatment, we encourage you to take your nausea medication as directed.   If you develop nausea and vomiting that is not controlled by your nausea medication, call the clinic.   BELOW ARE SYMPTOMS THAT SHOULD BE REPORTED IMMEDIATELY:  *FEVER GREATER THAN 100.5 F  *CHILLS WITH OR WITHOUT FEVER  NAUSEA AND VOMITING THAT IS NOT CONTROLLED WITH YOUR NAUSEA MEDICATION  *UNUSUAL SHORTNESS OF BREATH  *UNUSUAL BRUISING OR BLEEDING  TENDERNESS IN MOUTH AND THROAT WITH OR WITHOUT PRESENCE OF ULCERS  *URINARY PROBLEMS  *BOWEL PROBLEMS  UNUSUAL RASH Items with * indicate a potential emergency and should be followed up as soon as possible.  Feel free to call the clinic should you have any questions or concerns. The clinic phone number is [phone].  Please show the CHEMO ALERT CARD at check-in to the Emergency Department and triage nurse.   

## 2018-02-10 NOTE — Assessment & Plan Note (Signed)
Left breast invasive lobular cancer diagnosed 02/20/2011 status post left mastectomy with axillary lymph node dissection, 6 cm ILC with LVI, PNI, 9/16 lymph nodes positive with extracapsular extension, ER 100%, PR 100%, HER-2 -1.31, Ki-67 18%, T3a N2 a M0 stage IIIa status post F AC chemotherapy 4 followed by radiation and currently on letrozole 2.5 mg daily since 10/16/2011  Relapse/recurrence: Elevated LFTs led to ultrasound of the liver which revealed liver metastases September 2019  PET CT scan 11/04/2017: Heavy burden of metastatic disease to the liver involving all lobes, largest 7.7 x 5.4 cm with SUV of 16.6, additional hypermetabolic left supraclavicular, left subpectoral, portacaval and aortocaval lymphadenopathy  Liver biopsy: 11/17/2017: Metastatic breast cancer ER 100%, PR 0%, HER-2 3+ positive CARIS molecular testing 11/12/2017:Revealed PIK3CA mutation, androgen receptor positivity, TMB 7 Mut/Mb  Potential future treatment options: CDK 4 and 6 inhibitors and Alpelisib -------------------------------------------------------------------------------------------------------------------------------------------- Current treatment: .Systemic chemotherapyHerceptin and Perjeta (held Herceptin and Perjeta initiallybecause of low cardiac ejection fractioncleared for use by Dr. Marzetta Board Faslodex started 12/17/2017 (Taxotere was given 1025 20191 dose discontinued due to patient's inability to tolerate)  Toxicities: Denies any adverse effects Plan to obtain PET CT scan in February and follow-up after that.

## 2018-02-11 ENCOUNTER — Inpatient Hospital Stay (HOSPITAL_BASED_OUTPATIENT_CLINIC_OR_DEPARTMENT_OTHER): Payer: Medicare HMO | Admitting: Medical

## 2018-02-11 VITALS — BP 120/105 | HR 85 | Temp 97.9°F | Resp 18 | Ht 68.0 in | Wt 167.7 lb

## 2018-02-11 DIAGNOSIS — R590 Localized enlarged lymph nodes: Secondary | ICD-10-CM | POA: Diagnosis not present

## 2018-02-11 DIAGNOSIS — C50919 Malignant neoplasm of unspecified site of unspecified female breast: Secondary | ICD-10-CM | POA: Diagnosis not present

## 2018-02-11 DIAGNOSIS — I890 Lymphedema, not elsewhere classified: Secondary | ICD-10-CM | POA: Diagnosis not present

## 2018-02-11 DIAGNOSIS — Z5112 Encounter for antineoplastic immunotherapy: Secondary | ICD-10-CM | POA: Diagnosis not present

## 2018-02-11 MED ORDER — DOXYCYCLINE HYCLATE 100 MG PO TABS: 100.0000 mg | ORAL_TABLET | Freq: Two times a day (BID) | ORAL | 0 refills | Status: DC

## 2018-02-15 ENCOUNTER — Other Ambulatory Visit: Payer: Self-pay

## 2018-02-15 ENCOUNTER — Encounter (HOSPITAL_COMMUNITY): Payer: Self-pay | Admitting: Emergency Medicine

## 2018-02-15 ENCOUNTER — Emergency Department (HOSPITAL_COMMUNITY)
Admission: EM | Admit: 2018-02-15 | Discharge: 2018-02-15 | Discharge status: Against Medical Advice | Payer: Medicare HMO | Attending: Emergency Medicine | Admitting: Emergency Medicine

## 2018-02-15 ENCOUNTER — Emergency Department (HOSPITAL_COMMUNITY): Payer: Medicare HMO

## 2018-02-15 DIAGNOSIS — Z79899 Other long term (current) drug therapy: Secondary | ICD-10-CM | POA: Insufficient documentation

## 2018-02-15 DIAGNOSIS — I110 Hypertensive heart disease with heart failure: Secondary | ICD-10-CM | POA: Diagnosis not present

## 2018-02-15 DIAGNOSIS — I5042 Chronic combined systolic (congestive) and diastolic (congestive) heart failure: Secondary | ICD-10-CM | POA: Diagnosis not present

## 2018-02-15 DIAGNOSIS — Z7984 Long term (current) use of oral hypoglycemic drugs: Secondary | ICD-10-CM | POA: Insufficient documentation

## 2018-02-15 DIAGNOSIS — Z9049 Acquired absence of other specified parts of digestive tract: Secondary | ICD-10-CM | POA: Insufficient documentation

## 2018-02-15 DIAGNOSIS — Z853 Personal history of malignant neoplasm of breast: Secondary | ICD-10-CM | POA: Diagnosis not present

## 2018-02-15 DIAGNOSIS — R079 Chest pain, unspecified: Secondary | ICD-10-CM | POA: Diagnosis not present

## 2018-02-15 DIAGNOSIS — F329 Major depressive disorder, single episode, unspecified: Secondary | ICD-10-CM | POA: Diagnosis not present

## 2018-02-15 DIAGNOSIS — F419 Anxiety disorder, unspecified: Secondary | ICD-10-CM | POA: Insufficient documentation

## 2018-02-15 DIAGNOSIS — E119 Type 2 diabetes mellitus without complications: Secondary | ICD-10-CM | POA: Diagnosis not present

## 2018-02-15 NOTE — Progress Notes (Signed)
[facility] Progress Note   [name] [id number] Nov 15, 1940 78 y.o.  [name] is managed by Dr. Nicholas Lose  Actively treated with chemotherapy/immunotherapy/hormonal therapy: yes  Current Therapy: Perjeta and Herceptin  Last Treated: 02/10/2018 (cycle 4, day 1)  Assessment: Plan:    Lymphedema of left arm - Plan: Ambulatory referral to Physical Therapy  Lymphadenopathy, axillary - Plan: doxycycline (VIBRA-TABS) 100 MG tablet  Metastatic breast cancer ([place])   Left upper extremity lymphedema: The patient reports that she has not been able to find her compression sleeve.  She will be referred to the lymphedema clinic for evaluation.  Anterior left axillary lymphadenopathy: Patient was given a prescription for doxycycline 100 mg p.o. twice daily.  She has been told to return to clinic as needed.  Metastatic breast cancer: The patient is followed by Dr. Nicholas Lose and is status post cycle 4, day 1 of Perjeta and Herceptin which is dosed on 02/10/2018.  Please see After Visit Summary for patient specific instructions.  Future Appointments  Date Time Provider [place]  03/15/2018  2:45 PM [facility] LAB 1 [facility] None  03/15/2018  3:00 PM [facility] [place] FLUSH [facility] None  03/15/2018  3:15 PM Nicholas Lose, MD [facility] None  03/17/2018  1:30 PM [facility] INFUSION [facility] None  03/18/2018  1:00 PM [facility] ECHO 1-BUZZ [facility]  03/18/2018  2:00 PM Bensimhon, Shaune Pascal, MD [facility] None  03/31/2018 11:00 AM Sueanne Margarita, MD [facility]  04/28/2018  2:15 PM Nicholas Lose, MD [facility] None    Orders Placed This Encounter  Procedures  . Ambulatory referral to Physical Therapy       Subjective:   Patient ID:  [name] is a 78 y.o. (DOB Oct 31, 1940) female.  Chief Complaint: No chief complaint on file.   HPI [name] is a 78 year old female with a diagnosis of a metastatic  breast cancer.  She is followed by Dr. Nicholas Lose and is status post cycle 4, day 1 of Perjeta and Herceptin which is dosed on 02/10/2018.  She presents to the office today with a tender anterior left axillary lymphadenopathy which she noted earlier today.  She reports that she was cutting her dogs toenails when she was scratched on at least 2 areas of her left upper extremity.  She has a history of left upper extremity lymphedema.  She cannot find her left upper extremity compression sleeve.  She can only find her compression glove.  She denies fevers, chills, or sweats.  Medications: I have reviewed the patient's current medications.  Allergies:  Allergies  Allergen Reactions  . Cephalexin Hives    Patient indicates that she has used amoxicillin without hives or other allergic reaction.  . Clindamycin Hives  . Oxycodone-Acetaminophen Hives and Itching  . Rocephin [Ceftriaxone Sodium In Dextrose] Hives  . Sulfonamide Derivatives Hives  . Dilaudid [Hydromorphone Hcl] Itching    Past Medical History:  Diagnosis Date  . ALLERGIC RHINITIS 12/13/2007  . ANXIETY 06/04/2008  . Blood transfusion 1964   post childbirth  . 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 Chronic combined systolic (congestive) and diastolic (congestive) heart failure ([place])   . DEGENERATIVE JOINT DISEASE 12/17/2006  . DEPRESSION 08/10/2007  . Dilated cardiomyopathy ([place]) 05/29/2015   a. Dx 05/2015 - EF 35-40% - felt secondary to XRT and chemo for breast CA. Normal coronaries by cath.  . DM type  2 (diabetes mellitus, type 2) ([place])   . Dyspnea    sometimes when walking , sometimes when sitting  . Essential hypertension   . GERD 12/17/2006  . History of blood transfusion    after child birth  . History of chemotherapy   . History of hiatal hernia   . History of radiation therapy 10/07/11-11/24/11   left breast,5040 cGy/28 sessions,l chest wall boost=1000cGy/5 sesions  . 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 Hyperlipidemia   . 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 OBESITY 06/04/2008  . 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 Pneumonia   . Premature atrial contractions   . Sinus bradycardia    a. 2018 event monitor which showed NSR with average HR 71, frequent PACs, occasional PVCs, and sinus bradycardia as low as 50.    Past Surgical History:  Procedure Laterality Date  . bladder tack  1974  . BREAST EXCISIONAL BIOPSY  11/10/1988   left benign Dr Margot Chimes  . BREAST EXCISIONAL BIOPSY  03/04/1983   Right - two areas - Dr Margot Chimes  . BREAST EXCISIONAL BIOPSY  10/09/1980   right - Dr Margot Chimes  . BREAST EXCISIONAL BIOPSY  10/18/1979   left - Dr Margot Chimes  . BREAST EXCISIONAL BIOPSY  01/02/1994   right - Dr Margot Chimes  . BREAST EXCISIONAL BIOPSY  2017   right  . BREAST SURGERY    . CARDIAC CATHETERIZATION N/A 05/29/2015   Procedure: Left Heart Cath and Coronary Angiography;  Surgeon: Belva Crome, MD;  Location: [facility];  Service: Cardiovascular;  Laterality: N/A;  . CHOLECYSTECTOMY  02/23/1991   LC - Dr Margot Chimes  . COLONOSCOPY    . ENTEROCELE REPAIR  06/08   Dr. Cletis Media  . EVACUATION BREAST HEMATOMA  07/01/2011   Procedure: EVACUATION HEMATOMA BREAST;  Surgeon: Haywood Lasso, MD;  Location: [facility];  Service: General;  Laterality: Left;  Drainage of left mastectomy seroma  . EYE SURGERY Right    catartact  . HIP SURGERY Right 08/27/2010   DR. Maureen Ralphs-  "burcitis removal"  . LAMINECTOMY  1993   s/p-Dr. Rita Ohara  . MASTECTOMY Left 03/23/2011  . MODIFIED RADICAL MASTECTOMY W/ AXILLARY LYMPH NODE DISSECTION Left 03-30-11  . PORT-A-CATH REMOVAL  12/02/2011   Procedure: REMOVAL PORT-A-CATH;  Surgeon: Haywood Lasso, MD;  Location: [facility];  Service: General;  Laterality: N/A;  . PORTACATH PLACEMENT  04/13/2011   Procedure: INSERTION PORT-A-CATH;  Surgeon: Haywood Lasso, MD;  Location: [facility];  Service: General;  Laterality: N/A;  porta  cath placement,removal of J-P drain  . PORTACATH PLACEMENT Right 11/25/2017   Procedure: INSERTION PORT-A-CATH WITH [place];  Surgeon: Rolm Bookbinder, MD;  Location: [facility];  Service: General;  Laterality: Right;  . RADIOACTIVE SEED GUIDED EXCISIONAL BREAST BIOPSY Right 08/09/2015   Procedure: RIGHT RADIOACTIVE SEED GUIDED EXCISIONAL BREAST BIOPSY, RIGHT NIPPLE LESION EXCISION;  Surgeon: Rolm Bookbinder, MD;  Location: [facility];  Service: General;  Laterality: Right;  . ROTATOR CUFF REPAIR  8/03   right, Dr. Tonita Cong  . ROTATOR CUFF REPAIR  10/26/2008   left, Dr. Rush Farmer  . TONSILLECTOMY    . VAGINAL HYSTERECTOMY  1974   partial     Family History  Problem Relation Age of Onset  . COPD Mother   . Arthritis Mother   . Emphysema Mother   . Diabetes Brother   . Cancer Brother        prostate  .  Cancer Father        malignant brain tumor  . Mental illness Paternal Grandfather   . Heart disease Other        Sibling  . Diabetes Other        Sibling     Social History   Socioeconomic History  . Marital status: Married    Spouse name: Not on file  . Number of children: 3  . Years of education: Not on file  . Highest education level: Not on file  Occupational History    Employer: RETIRED  Social Needs  . Financial resource strain: Not on file  . Food insecurity:    Worry: Not on file    Inability: Not on file  . Transportation needs:    Medical: Not on file    Non-medical: Not on file  Tobacco Use  . Smoking status: Never Smoker  . Smokeless tobacco: Never Used  Substance and Sexual Activity  . Alcohol use: No  . Drug use: No  . Sexual activity: Yes  Lifestyle  . Physical activity:    Days per week: Not on file    Minutes per session: Not on file  . Stress: Not on file  Relationships  . Social connections:    Talks on phone: Not on file    Gets together: Not on file    Attends religious service: Not on file    Active member of club or  organization: Not on file    Attends meetings of clubs or organizations: Not on file    Relationship status: Not on file  . Intimate partner violence:    Fear of current or ex partner: Not on file    Emotionally abused: Not on file    Physically abused: Not on file    Forced sexual activity: Not on file  Other Topics Concern  . 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   Please see review of systems for further details on the patient's review from today.   Review of Systems:  Review of Systems  Constitutional: Negative for chills, diaphoresis and fever.  HENT: Negative for trouble swallowing and voice change.   Respiratory: Negative for cough, chest tightness, shortness of breath and wheezing.   Cardiovascular: Negative for chest pain and palpitations.  Gastrointestinal: Negative for abdominal pain, constipation, diarrhea, nausea and vomiting.  Musculoskeletal: Negative for back pain and myalgias.       Left upper extremity lymphadenopathy.  Neurological: Negative for dizziness, light-headedness and headaches.  Hematological: Positive for adenopathy (Tender anterior left axillary lymphadenopathy.).    Objective:   Physical Exam:  BP (!) 120/105 (BP Location: Right Arm, Patient Position: Sitting) Comment: Notified Nurse of Bp  Pulse 85   Temp 97.9 F (36.6 C) (Oral)   Resp 18   Ht 5\' 8"  (1.727 m)   Wt 167 lb 11.2 oz (76.1 kg)   SpO2 97%   BMI 25.50 kg/m  ECOG: 0  Physical Exam Constitutional:      General: She is not in acute distress.    Appearance: She is not diaphoretic.  HENT:     Head: Normocephalic and atraumatic.  Cardiovascular:     Rate and Rhythm: Normal rate and regular  rhythm.     Heart sounds: Normal heart sounds. No murmur. No friction rub. No gallop.   Pulmonary:     Effort: Pulmonary effort is normal. No respiratory distress.     Breath sounds: Normal breath sounds. No wheezing or rales.  Musculoskeletal:        General: Swelling (Left upper extremity lymphedema.) present.  Lymphadenopathy:     Upper Body:     Left upper body: Axillary adenopathy present.     Comments: Tender enlarged left axillary lymphadenopathy.  Skin:    General: Skin is warm and dry.     Findings: No erythema or rash.  Neurological:     Mental Status: She is alert.     Lab Review:     Component Value Date/Time   NA 142 02/10/2018 0818   NA 142 04/29/2016 1438   K 4.2 02/10/2018 0818   K 4.1 04/29/2016 1438   CL 106 02/10/2018 0818   CL 102 02/25/2012 0908   CO2 27 02/10/2018 0818   CO2 31 (H) 04/29/2016 1438   GLUCOSE 110 (H) 02/10/2018 0818   GLUCOSE 93 04/29/2016 1438   GLUCOSE 232 (H) 02/25/2012 0908   BUN 13 02/10/2018 0818   BUN 14.4 04/29/2016 1438   CREATININE 0.83 02/10/2018 0818   CREATININE 0.8 04/29/2016 1438   CALCIUM 9.9 02/10/2018 0818   CALCIUM 10.3 04/29/2016 1438   PROT 6.3 (L) 02/10/2018 0818   PROT 7.0 04/29/2016 1438   ALBUMIN 3.2 (L) 02/10/2018 0818   ALBUMIN 3.7 04/29/2016 1438   AST 58 (H) 02/10/2018 0818   AST 15 04/29/2016 1438   ALT 47 (H) 02/10/2018 0818   ALT 15 04/29/2016 1438   ALKPHOS 125 02/10/2018 0818   ALKPHOS 127 04/29/2016 1438   BILITOT 0.6 02/10/2018 0818   BILITOT 0.53 04/29/2016 1438   GFRNONAA >60 02/10/2018 0818   GFRAA >60 02/10/2018 0818       Component Value Date/Time   WBC 4.4 02/10/2018 0818   WBC 25.8 (H) 12/06/2017 0611   RBC 4.90 02/10/2018 0818   HGB 14.7 02/10/2018 0818   HGB 14.4 04/29/2016 1438   HCT 45.8 02/10/2018 0818   HCT 43.2 04/29/2016 1438   PLT 196 02/10/2018 0818   PLT 227 04/29/2016 1438   MCV 93.5 02/10/2018 0818   MCV 89.9 04/29/2016 1438   MCH 30.0 02/10/2018 0818    MCHC 32.1 02/10/2018 0818   RDW 14.5 02/10/2018 0818   RDW 14.1 04/29/2016 1438   LYMPHSABS 1.3 02/10/2018 0818   LYMPHSABS 2.6 04/29/2016 1438   MONOABS 0.6 02/10/2018 0818   MONOABS 0.6 04/29/2016 1438   EOSABS 0.2 02/10/2018 0818   EOSABS 0.1 04/29/2016 1438   BASOSABS 0.1 02/10/2018 0818   BASOSABS 0.1 04/29/2016 1438   -------------------------------  Imaging from last 24 hours (if applicable):  Radiology interpretation: No results found.

## 2018-02-15 NOTE — ED Provider Notes (Signed)
[name] DEPT Provider Note   CSN: [id number] Arrival date & time: 02/15/18  1518     History   Chief Complaint Chief Complaint  Patient presents with  . Chest Pain    HPI [name] is a 78 y.o. female.  HPI Patient presents with sharp left-sided chest pain.  On her left chest..  Previous surgery here for breast cancer.  Starts and lasts a second or 2.  Not exertional.  Comes and goes.  No shortness of breath or cough.  No swelling in her legs.  Does have some mild swelling in the left arm that is chronic.  No fevers.  No cough.  Previous congestive heart failure from cardiotoxicity from chemo.  Previous clean heart cath.  No abdominal pain.  No rash. Past Medical History:  Diagnosis Date  . ALLERGIC RHINITIS 12/13/2007  . ANXIETY 06/04/2008  . Blood transfusion 1964   post childbirth  . 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 Chronic combined systolic (congestive) and diastolic (congestive) heart failure ([place])   . DEGENERATIVE JOINT DISEASE 12/17/2006  . DEPRESSION 08/10/2007  . Dilated cardiomyopathy ([place]) 05/29/2015   a. Dx 05/2015 - EF 35-40% - felt secondary to XRT and chemo for breast CA. Normal coronaries by cath.  . DM type 2 (diabetes mellitus, type 2) ([place])   . Dyspnea    sometimes when walking , sometimes when sitting  . Essential hypertension   . GERD 12/17/2006  . History of blood transfusion    after child birth  . History of chemotherapy   . History of hiatal hernia   . History of radiation therapy 10/07/11-11/24/11   left breast,5040 cGy/28 sessions,l chest wall boost=1000cGy/5 sesions  . 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 Hyperlipidemia   . 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 OBESITY 06/04/2008  . 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 Pneumonia   . Premature atrial contractions   . Sinus bradycardia    a. 2018 event monitor which showed NSR with average HR 71, frequent PACs,  occasional PVCs, and sinus bradycardia as low as 50.    Patient Active Problem List   Diagnosis Date Noted  . Metastatic breast cancer ([place]) 12/03/2017  . Neutropenic fever ([place]) 12/03/2017  . Port-A-Cath in place 12/01/2017  . PVC's (premature ventricular contractions) 05/28/2016  . NICM (nonischemic cardiomyopathy) ([place]) 05/28/2016  . Premature atrial contractions 04/21/2016  . Chronic combined systolic and diastolic CHF (congestive heart failure) ([place]) 07/17/2015  . Dilated cardiomyopathy ([place]) 05/29/2015  . Recurrent major depressive disorder ([place]) 02/28/2015  . Acute bronchitis 12/19/2014  . Acid reflux 06/20/2014  . Herpes 06/20/2014  . 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 H/O malignant neoplasm of breast 03/15/2013  . HLD (hyperlipidemia) 03/15/2013  . Long term current use of insulin ([place]) 03/15/2013  . 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 Controlled type 2 diabetes mellitus without complication ([place]) 78/67/6720  . Mixed incontinence 03/15/2013  . Macular degeneration 03/02/2013  . LBP (low back pain) 02/23/2013  . Neutropenia with fever ([place]) 06/25/2011  . Open wound of left breast 05/22/2011  . Primary cancer of lower-inner quadrant of left female breast ([place]) 02/20/2011  . DM type 2 (diabetes mellitus, type 2) ([place]) 08/10/2007  . DEPRESSION 08/10/2007  . HYPERCHOLESTEROLEMIA 12/17/2006  . Essential hypertension 12/17/2006  . GERD 12/17/2006    Past Surgical History:  Procedure Laterality Date  .  bladder tack  1974  . BREAST EXCISIONAL BIOPSY  11/10/1988   left benign Dr Margot Chimes  . BREAST EXCISIONAL BIOPSY  03/04/1983   Right - two areas - Dr Margot Chimes  . BREAST EXCISIONAL BIOPSY  10/09/1980   right - Dr Margot Chimes  . BREAST EXCISIONAL BIOPSY  10/18/1979   left - Dr Margot Chimes  . BREAST EXCISIONAL BIOPSY  01/02/1994   right - Dr Margot Chimes  . BREAST EXCISIONAL BIOPSY  2017   right  . BREAST SURGERY    . CARDIAC CATHETERIZATION N/A 05/29/2015   Procedure: Left  Heart Cath and Coronary Angiography;  Surgeon: Belva Crome, MD;  Location: [facility];  Service: Cardiovascular;  Laterality: N/A;  . CHOLECYSTECTOMY  02/23/1991   LC - Dr Margot Chimes  . COLONOSCOPY    . ENTEROCELE REPAIR  06/08   Dr. Cletis Media  . EVACUATION BREAST HEMATOMA  07/01/2011   Procedure: EVACUATION HEMATOMA BREAST;  Surgeon: Haywood Lasso, MD;  Location: [facility];  Service: General;  Laterality: Left;  Drainage of left mastectomy seroma  . EYE SURGERY Right    catartact  . HIP SURGERY Right 08/27/2010   DR. Maureen Ralphs-  "burcitis removal"  . LAMINECTOMY  1993   s/p-Dr. Rita Ohara  . MASTECTOMY Left 03/23/2011  . MODIFIED RADICAL MASTECTOMY W/ AXILLARY LYMPH NODE DISSECTION Left 03-30-11  . PORT-A-CATH REMOVAL  12/02/2011   Procedure: REMOVAL PORT-A-CATH;  Surgeon: Haywood Lasso, MD;  Location: [facility];  Service: General;  Laterality: N/A;  . PORTACATH PLACEMENT  04/13/2011   Procedure: INSERTION PORT-A-CATH;  Surgeon: Haywood Lasso, MD;  Location: [facility];  Service: General;  Laterality: N/A;  porta cath placement,removal of J-P drain  . PORTACATH PLACEMENT Right 11/25/2017   Procedure: INSERTION PORT-A-CATH WITH [place];  Surgeon: Rolm Bookbinder, MD;  Location: [facility];  Service: General;  Laterality: Right;  . RADIOACTIVE SEED GUIDED EXCISIONAL BREAST BIOPSY Right 08/09/2015   Procedure: RIGHT RADIOACTIVE SEED GUIDED EXCISIONAL BREAST BIOPSY, RIGHT NIPPLE LESION EXCISION;  Surgeon: Rolm Bookbinder, MD;  Location: [facility];  Service: General;  Laterality: Right;  . ROTATOR CUFF REPAIR  8/03   right, Dr. Tonita Cong  . ROTATOR CUFF REPAIR  10/26/2008   left, Dr. Rush Farmer  . TONSILLECTOMY    . VAGINAL HYSTERECTOMY  1974   partial      OB History    Gravida  5   Para  3   Term      Preterm      AB      Living  3     SAB      TAB      Ectopic      Multiple      Live Births               Home  Medications    Prior to Admission medications   Medication Sig Start Date End Date Taking? Authorizing Provider  acyclovir (ZOVIRAX) 200 MG capsule TAKE 2 CAPSULES BY MOUTH 3 TIMES DAILY Patient taking differently: Take 400 mg by mouth 3 (three) times daily.  10/23/13  Yes Noralee Space, MD  ALPRAZolam Duanne Moron) 0.5 MG tablet TAKE ONE-HALF TO ONE TABLET BY MOUTH THREE TIMES DAILY AS NEEDED FOR  NERVES Patient taking differently: Take 0.5 mg by mouth 3 (three) times daily as needed for anxiety.  02/23/13  Yes Noralee Space, MD  amiodarone (PACERONE) 200 MG tablet Take 1 tablet (200 mg total)  by mouth daily. 01/04/18  Yes Bensimhon, Shaune Pascal, MD  amoxicillin (AMOXIL) 500 MG capsule Take four capsules one hour before dental appointment. 01/19/18  Yes Lenn Cal, DDS  carvedilol (COREG) 6.25 MG tablet TAKE 1 TABLET TWICE DAILY WITH MEALS Patient taking differently: Take 6.25 mg by mouth 2 (two) times daily with a meal.  12/27/17  Yes Turner, Traci R, MD  Insulin Pen Needle (RELION PEN NEEDLE 31G/8MM) 31G X 8 MM MISC 1 Device by Does not apply route 4 (four) times daily. 10/05/12  Yes Renato Shin, MD  letrozole [facility]) 2.5 MG tablet Take 2.5 mg by mouth daily.  12/14/17  Yes [provider]  lidocaine-prilocaine (EMLA) cream Apply to affected area once Patient taking differently: Apply 1 application topically once. Apply to affected area once 11/19/17  Yes Nicholas Lose, MD  liraglutide (VICTOZA) 18 MG/3ML SOPN Inject 0.3 mLs (1.8 mg total) into the skin daily. 12/16/17  Yes Nicholas Lose, MD  losartan (COZAAR) 25 MG tablet Take 0.5 tablets (12.5 mg total) by mouth at bedtime. 12/14/17 03/14/18 Yes Bensimhon, Shaune Pascal, MD  magnesium oxide (MAG-OX) 400 MG tablet Take 1 tablet (400 mg total) by mouth daily. Patient taking differently: Take 400 mg by mouth daily. Take 2 daily 12/06/17  Yes Florencia Reasons, MD  omeprazole (PRILOSEC) 40 MG capsule Take 40 mg by mouth daily.   Yes [provider]  ondansetron (ZOFRAN) 8 MG tablet Take 1 tablet (8 mg total) by mouth 2 (two) times daily as needed for refractory nausea / vomiting. 11/19/17  Yes Nicholas Lose, MD  sertraline (ZOLOFT) 50 MG tablet Take 1 tablet (50 mg total) by mouth daily. 02/23/13  Yes Noralee Space, MD  spironolactone (ALDACTONE) 25 MG tablet Take 0.5 tablets (12.5 mg total) by mouth every Monday, Wednesday, and Friday. 12/06/17  Yes Florencia Reasons, MD  cholecalciferol (VITAMIN D) 1000 UNITS tablet Take 1 tablet (1,000 Units total) by mouth daily. Patient not taking: Reported on 02/15/2018 02/23/13   Noralee Space, MD  cyanocobalamin 500 MCG tablet Take 1 tablet (500 mcg total) by mouth daily. Patient not taking: Reported on 02/15/2018 02/23/13   Noralee Space, MD  doxycycline (VIBRA-TABS) 100 MG tablet Take 1 tablet (100 mg total) by mouth 2 (two) times daily. Patient not taking: Reported on 02/15/2018 02/11/18   Harle Stanford., PA-C  fluconazole (DIFLUCAN) 100 MG tablet Take 1 tablet (100 mg total) by mouth daily. Patient not taking: Reported on 01/19/2018 12/01/17   Nicholas Lose, MD  HYDROcodone-acetaminophen (NORCO) 10-325 MG tablet Take 1 tablet by mouth every 6 (six) hours as needed. Patient not taking: Reported on 01/19/2018 11/25/17 11/25/18  Rolm Bookbinder, MD  magic mouthwash w/lidocaine SOLN Take 5 mLs by mouth 3 (three) times daily as needed for mouth pain. Patient not taking: Reported on 01/19/2018 12/06/17   Florencia Reasons, MD  Multiple Vitamins-Minerals (ONE-A-DAY WOMENS 50+ ADVANTAGE) TABS Take 1 tablet by mouth daily. Patient not taking: Reported on 02/15/2018 02/23/13   Noralee Space, MD  prochlorperazine (COMPAZINE) 10 MG tablet Take 1 tablet (10 mg total) by mouth every 6 (six) hours as needed (Nausea or vomiting). Patient not taking: Reported on 01/19/2018 11/19/17   Nicholas Lose, MD    Family History Family History  Problem Relation Age of Onset  . COPD Mother   . Arthritis Mother   .  Emphysema Mother   . Diabetes Brother   . Cancer Brother  prostate  . Cancer Father        malignant brain tumor  . Mental illness Paternal Grandfather   . Heart disease Other        Sibling  . Diabetes Other        Sibling     Social History Social History   Tobacco Use  . Smoking status: Never Smoker  . Smokeless tobacco: Never Used  Substance Use Topics  . Alcohol use: No  . Drug use: No     Allergies   Cephalexin; Clindamycin; Oxycodone-acetaminophen; Rocephin [ceftriaxone sodium in dextrose]; Sulfonamide derivatives; and Dilaudid [hydromorphone hcl]   Review of Systems Review of Systems  Constitutional: Negative for appetite change.  HENT: Negative for congestion.   Respiratory: Negative for shortness of breath.   Cardiovascular: Positive for chest pain.  Gastrointestinal: Negative for abdominal pain.  Genitourinary: Negative for dysuria.  Musculoskeletal: Negative for back pain.  Skin: Negative for rash.  Neurological: Negative for weakness and numbness.  Hematological: Negative for adenopathy.  Psychiatric/Behavioral: Negative for confusion.     Physical Exam Updated Vital Signs BP 117/65   Pulse 77   Temp 98 F (36.7 C) (Oral)   Resp (!) 27   Ht 5\' 7"  (1.702 m)   Wt 72.6 kg   SpO2 92%   BMI 25.06 kg/m   Physical Exam HENT:     Head: Atraumatic.  Eyes:     Pupils: Pupils are equal, round, and reactive to light.  Neck:     Musculoskeletal: Neck supple.  Cardiovascular:     Rate and Rhythm: Normal rate and regular rhythm.  Pulmonary:     Effort: No tachypnea.     Comments: Some tenderness to left lateral chest wall.  Post mastectomy.  No rash.  Lungs are clear. Abdominal:     Tenderness: There is no abdominal tenderness.  Musculoskeletal:     Right lower leg: No edema.     Left lower leg: No edema.  Skin:    General: Skin is warm.     Capillary Refill: Capillary refill takes less than 2 seconds.  Neurological:     General: No  focal deficit present.     Mental Status: She is alert.      ED Treatments / Results  Labs (all labs ordered are listed, but only abnormal results are displayed) Labs Reviewed  BASIC METABOLIC PANEL  CBC  I-STAT TROPONIN, ED    EKG EKG Interpretation  Date/Time:  Tuesday February 15 2018 15:40:43 EST Ventricular Rate:  85 PR Interval:    QRS Duration: 122 QT Interval:  382 QTC Calculation: 455 R Axis:   -61 Text Interpretation:  Sinus rhythm Ventricular premature complex Left bundle branch block Baseline wander in lead(s) I III aVL Confirmed by Davonna Belling [phone]) on 02/15/2018 4:00:41 PM   Radiology Dg Chest 2 View  Result Date: 02/15/2018 CLINICAL DATA:  Chest pain. EXAM: CHEST - 2 VIEW COMPARISON:  Radiograph of December 03, 2017. FINDINGS: The heart size and mediastinal contours are within normal limits. No pneumothorax or pleural effusion is noted. Right internal jugular Port-A-Cath is unchanged in position. Both lungs are clear. The visualized skeletal structures are unremarkable. IMPRESSION: No active cardiopulmonary disease. Electronically Signed   By: Marijo Conception, M.D.   On: 02/15/2018 16:16    Procedures Procedures (including critical care time)  Medications Ordered in ED Medications - No data to display   Initial Impression / Assessment and Plan / ED Course  I  have reviewed the triage vital signs and the nursing notes.  Pertinent labs & imaging results that were available during my care of the patient were reviewed by me and considered in my medical decision making (see chart for details).     Patient with sharp left-sided chest pain.  Reproducible.  Think more likely musculoskeletal.  EKG reassuring.  Patient does not want any blood work done.  States she does not want to wait any longer.  I think she is low risk for cardiac.  Doubt pulmonary embolism.  Lab work is somewhat low yield but I think it should be done.  Patient is leaving AMA but with  shared decision making.  Final Clinical Impressions(s) / ED Diagnoses   Final diagnoses:  Nonspecific chest pain    ED Discharge Orders    None       Davonna Belling, MD 02/15/18 1713

## 2018-02-15 NOTE — ED Notes (Signed)
Patient refusing port access and blood draw. States "I don't think I need it and I don't want to be here a couple more hours." MD made aware.

## 2018-02-15 NOTE — ED Triage Notes (Signed)
Pt c/o left sided chest pains since yesterday that has been intermittent that radiates to left back and left shoulder. reports last chemo was Thursday last week. Reports that has heart problems.

## 2018-02-15 NOTE — ED Notes (Signed)
MD at bedside. 

## 2018-02-16 ENCOUNTER — Telehealth: Payer: Self-pay | Admitting: Hematology and Oncology

## 2018-02-16 NOTE — Telephone Encounter (Signed)
Called patient per 1/09 sch message - unable to reach patient - left message with appt date and time

## 2018-02-21 ENCOUNTER — Ambulatory Visit: Payer: Medicare HMO | Admitting: Rehabilitation

## 2018-02-21 ENCOUNTER — Encounter: Payer: Self-pay | Admitting: Hematology and Oncology

## 2018-02-21 ENCOUNTER — Other Ambulatory Visit: Payer: Self-pay

## 2018-02-21 ENCOUNTER — Telehealth: Payer: Self-pay | Admitting: Adult Health

## 2018-02-21 ENCOUNTER — Encounter: Payer: Self-pay | Admitting: Rehabilitation

## 2018-02-21 DIAGNOSIS — C50919 Malignant neoplasm of unspecified site of unspecified female breast: Secondary | ICD-10-CM

## 2018-02-21 NOTE — Therapy (Signed)
Pt arrived to eval reporting she did not want to do PT just wanted to get a new sleeve.  Arrangements were made for pt to get measured here next Monday with our fitter.  No eval performed  Shan Levans, PT

## 2018-02-21 NOTE — Telephone Encounter (Signed)
Called and left message with Dr. Reesa Chew at [facility] at [phone] about patient needing PET scan and CT chest abdomen and pelvis.  Dr. Reesa Chew called me back within 5 minutes.  I reviewed that we needed restaging with PET scan and baseline CT scans since we hadn't previously gotten those. He noted these reasons and stated he would approve scans.  He would not give me an authorization number but noted one would be forth coming on fax.  Wilber Bihari, NP

## 2018-02-23 ENCOUNTER — Encounter: Payer: Self-pay | Admitting: Hematology and Oncology

## 2018-02-24 ENCOUNTER — Inpatient Hospital Stay: Payer: Medicare HMO

## 2018-02-24 DIAGNOSIS — Z5112 Encounter for antineoplastic immunotherapy: Secondary | ICD-10-CM | POA: Diagnosis not present

## 2018-02-24 DIAGNOSIS — M8580 Other specified disorders of bone density and structure, unspecified site: Secondary | ICD-10-CM

## 2018-02-24 DIAGNOSIS — Z95828 Presence of other vascular implants and grafts: Secondary | ICD-10-CM

## 2018-02-24 MED ORDER — FULVESTRANT 250 MG/5ML IM SOLN
INTRAMUSCULAR | Status: AC
  Filled 2018-02-24: qty 10

## 2018-02-24 MED ORDER — FULVESTRANT 250 MG/5ML IM SOLN
500.0000 mg | Freq: Once | INTRAMUSCULAR | Status: AC
  Administered 2018-02-24: 500 mg via INTRAMUSCULAR

## 2018-02-24 NOTE — Patient Instructions (Signed)

## 2018-03-04 ENCOUNTER — Encounter (HOSPITAL_COMMUNITY)
Admission: RE | Admit: 2018-03-04 | Discharge: 2018-03-04 | Disposition: A | Discharge status: Home / Self Care | Payer: Medicare HMO | Source: Ambulatory Visit | Attending: Hematology and Oncology | Admitting: Hematology and Oncology

## 2018-03-04 DIAGNOSIS — C50919 Malignant neoplasm of unspecified site of unspecified female breast: Secondary | ICD-10-CM

## 2018-03-04 LAB — GLUCOSE, CAPILLARY: Glucose-Capillary: 107 mg/dL — ABNORMAL HIGH (ref 70–99)

## 2018-03-04 MED ORDER — FLUDEOXYGLUCOSE F - 18 (FDG) INJECTION
8.7000 | Freq: Once | INTRAVENOUS | Status: AC | PRN
  Administered 2018-03-04: 8.7 via INTRAVENOUS

## 2018-03-11 ENCOUNTER — Ambulatory Visit (HOSPITAL_COMMUNITY)
Admission: RE | Admit: 2018-03-11 | Discharge: 2018-03-11 | Disposition: A | Discharge status: Home / Self Care | Payer: Medicare HMO | Source: Ambulatory Visit | Attending: Hematology and Oncology | Admitting: Hematology and Oncology

## 2018-03-11 DIAGNOSIS — C50919 Malignant neoplasm of unspecified site of unspecified female breast: Secondary | ICD-10-CM | POA: Insufficient documentation

## 2018-03-11 MED ORDER — SODIUM CHLORIDE (PF) 0.9 % IJ SOLN
INTRAMUSCULAR | Status: AC
  Filled 2018-03-11: qty 50

## 2018-03-11 MED ORDER — HEPARIN SOD (PORK) LOCK FLUSH 100 UNIT/ML IV SOLN
INTRAVENOUS | Status: AC
  Filled 2018-03-11: qty 5

## 2018-03-11 MED ORDER — IOHEXOL 300 MG/ML  SOLN
100.0000 mL | Freq: Once | INTRAMUSCULAR | Status: AC | PRN
  Administered 2018-03-11: 100 mL via INTRAVENOUS

## 2018-03-11 MED ORDER — HEPARIN SOD (PORK) LOCK FLUSH 100 UNIT/ML IV SOLN
500.0000 [IU] | Freq: Once | INTRAVENOUS | Status: AC
  Administered 2018-03-11: 500 [IU] via INTRAVENOUS

## 2018-03-11 NOTE — Progress Notes (Signed)
Patient Care Team: Vicenta Aly, FNP as PCP - General (Nurse Practitioner) Neldon Mc, MD as Surgeon (General Surgery) Sueanne Margarita, MD as Consulting Physician (Cardiology)  DIAGNOSIS:    ICD-10-CM   1. Primary cancer of lower-inner quadrant of left female breast ([place]) C50.312     SUMMARY OF ONCOLOGIC HISTORY:   Primary cancer of lower-inner quadrant of left female breast ([place])   02/20/2011 Initial Diagnosis    Left Breast invasive lobular carcinoma; 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 Her clinical staging was stage IIB (T3, N0, M0).      03/23/2011 Surgery    L mastectomy + Axillary LND 6 cm ILC with LVI, PNI; 9/16 LN positive with Extracapsular ext ER 100%; PR 100%; Her 2 1.31; Ki 67: 18; pT3a N2a (stage IIIA)    04/17/2011 - 06/19/2011 Chemotherapy    FEC X 4 complicated by febrile neutropenis, spesis and chest wall abscess    10/07/2011 - 11/24/2011 Radiation Therapy    Left chest wall and axilla 5040 cGy in 28 # with Boost    10/16/2011 -  Anti-estrogen oral therapy    Letrozole 2.5 mg po daily    05/27/2015 - 05/30/2015 Hospital Admission    Acute systolic CHF, dilated cardiomyopathy, EF 35-40%    08/09/2015 Surgery    Right lumpectomy: Fibrocystic changes with calcifications, right nipple lesion: Seborrheic keratosis no evidence of malignancy    10/20/2017 Imaging    Elevated LFTs letter ultrasound of the liver which showed liver metastases    11/04/2017 PET scan    Heavy burden of metastatic disease to the liver involving all lobes, index 7.7 centimeters right hepatic lobe mass SUV 16.6, small but hypermetabolic left supraclavicular, left subpectoral, portacaval, aortocaval adenopathy    11/12/2017 Pathology Results    Liver biopsy: Metastatic breast cancer, ER 100%, PR 0%, Ki-67 10%, HER-2 3+ positive    11/19/2017 -  Chemotherapy    Taxotere, Herceptin, Perjeta,   Taxotere tolerated poorly, discontinued, on  Herceptin Perjeta alone     CHIEF COMPLIANT: Follow-up of metastatic breast cancer  INTERVAL HISTORY: [name] is a 78 y.o. with above-mentioned history of metastatic breast cancer is currently on monthly Herceptin, Perjeta, and Faslodex. A CT CAP from 03/11/18 showed a metastasis on the liver that significantly decreased in activity showing a response to treatment, small non-specific bilateral adrenal nodules, and enlarged left supraclavicular lymph nodes. She presents to the clinic today with two family members. She notes significant loss of appetite but denies nausea. She reports a rash on her abdomen for which nystatin and hydrocortizone were not helpful. Her labs from today are WNL. She will see her cardiologist on Friday. She reviewed her medication list with me.   REVIEW OF SYSTEMS:   Constitutional: Denies fevers, chills or abnormal weight loss (+) loss of appetite Eyes: Denies blurriness of vision Ears, nose, mouth, throat, and face: Denies mucositis or sore throat Respiratory: Denies cough, dyspnea or wheezes Cardiovascular: Denies palpitation, chest discomfort Gastrointestinal: Denies nausea, heartburn or change in bowel habits Skin: (+) abd rash Lymphatics: Denies new lymphadenopathy or easy bruising Neurological: Denies numbness, tingling or new weaknesses Behavioral/Psych: Mood is stable, no new changes  Extremities: No lower extremity edema Breast: denies any pain or lumps or nodules in either breasts All other systems were reviewed with the patient and are negative.  I have reviewed the past medical history, past surgical history, social history and family history with the patient and they are unchanged  from previous note.  ALLERGIES:  is allergic to cephalexin; clindamycin; oxycodone-acetaminophen; rocephin [ceftriaxone sodium in dextrose]; sulfonamide derivatives; and dilaudid [hydromorphone hcl].  MEDICATIONS:  Current Outpatient Medications  Medication Sig  Dispense Refill  . acyclovir (ZOVIRAX) 200 MG capsule TAKE 2 CAPSULES BY MOUTH 3 TIMES DAILY (Patient taking differently: Take 400 mg by mouth 3 (three) times daily. ) 540 capsule 3  . ALPRAZolam (XANAX) 0.5 MG tablet TAKE ONE-HALF TO ONE TABLET BY MOUTH THREE TIMES DAILY AS NEEDED FOR  NERVES (Patient taking differently: Take 0.5 mg by mouth 3 (three) times daily as needed for anxiety. ) 270 tablet 1  . amiodarone (PACERONE) 200 MG tablet Take 1 tablet (200 mg total) by mouth daily. 30 tablet 3  . amoxicillin (AMOXIL) 500 MG capsule Take four capsules one hour before dental appointment. 4 capsule 1  . carvedilol (COREG) 6.25 MG tablet TAKE 1 TABLET TWICE DAILY WITH MEALS (Patient taking differently: Take 6.25 mg by mouth 2 (two) times daily with a meal. ) 180 tablet 3  . cholecalciferol (VITAMIN D) 1000 UNITS tablet Take 1 tablet (1,000 Units total) by mouth daily. 90 tablet 3  . cyanocobalamin 500 MCG tablet Take 1 tablet (500 mcg total) by mouth daily. 90 tablet 3  . Insulin Pen Needle (RELION PEN NEEDLE 31G/8MM) 31G X 8 MM MISC 1 Device by Does not apply route 4 (four) times daily. 120 each 11  . ketoconazole (NIZORAL) 2 % cream Apply 1 application topically daily. 15 g 0  . letrozole (FEMARA) 2.5 MG tablet Take 2.5 mg by mouth daily.     Marland Kitchen lidocaine-prilocaine (EMLA) cream Apply to affected area once (Patient taking differently: Apply 1 application topically once. Apply to affected area once) 30 g 3  . liraglutide (VICTOZA) 18 MG/3ML SOPN Inject 0.3 mLs (1.8 mg total) into the skin daily.    Marland Kitchen LORazepam (ATIVAN) 0.5 MG tablet Take 1 tablet (0.5 mg total) by mouth at bedtime as needed for sleep. 30 tablet 0  . losartan (COZAAR) 25 MG tablet Take 0.5 tablets (12.5 mg total) by mouth at bedtime. 15 tablet 6  . magnesium oxide (MAG-OX) 400 MG tablet Take 1 tablet (400 mg total) by mouth daily. 60 tablet 3  . Multiple Vitamins-Minerals (ONE-A-DAY WOMENS 50+ ADVANTAGE) TABS Take 1 tablet by mouth  daily. 90 tablet 3  . omeprazole (PRILOSEC) 40 MG capsule Take 40 mg by mouth daily.    . sertraline (ZOLOFT) 50 MG tablet Take 1 tablet (50 mg total) by mouth daily. 90 tablet 3  . spironolactone (ALDACTONE) 25 MG tablet Take 0.5 tablets (12.5 mg total) by mouth every Monday, Wednesday, and Friday. 30 tablet 6   No current facility-administered medications for this visit.     PHYSICAL EXAMINATION: ECOG PERFORMANCE STATUS: 2 - Symptomatic, <50% confined to bed  Vitals:   03/15/18 1541  BP: (!) 150/62  Pulse: 72  Resp: 16  Temp: (!) 97.5 F (36.4 C)  SpO2: 99%   Filed Weights   03/15/18 1541  Weight: 175 lb (79.4 kg)    GENERAL: alert, no distress and comfortable SKIN: skin color, texture, turgor are normal, no rashes or significant lesions EYES: normal, Conjunctiva are pink and non-injected, sclera clear OROPHARYNX: no exudate, no erythema and lips, buccal mucosa, and tongue normal  NECK: supple, thyroid normal size, non-tender, without nodularity LYMPH: no palpable lymphadenopathy in the cervical, axillary or inguinal LUNGS: clear to auscultation and percussion with normal breathing effort HEART: regular rate &  rhythm and no murmurs and no lower extremity edema ABDOMEN: abdomen soft, non-tender and normal bowel sounds MUSCULOSKELETAL: no cyanosis of digits and no clubbing  NEURO: alert & oriented x 3 with fluent speech, no focal motor/sensory deficits EXTREMITIES: No lower extremity edema  LABORATORY DATA:  I have reviewed the data as listed CMP Latest Ref Rng & Units 03/15/2018 02/10/2018 01/13/2018  Glucose 70 - 99 mg/dL 142(H) 110(H) 203(H)  BUN 8 - 23 mg/dL '16 13 16  '$ Creatinine 0.44 - 1.00 mg/dL 0.95 0.83 1.15(H)  Sodium 135 - 145 mmol/L 142 142 142  Potassium 3.5 - 5.1 mmol/L 4.0 4.2 4.1  Chloride 98 - 111 mmol/L 105 106 106  CO2 22 - 32 mmol/L '27 27 27  '$ Calcium 8.9 - 10.3 mg/dL 9.8 9.9 9.6  Total Protein 6.5 - 8.1 g/dL 6.0(L) 6.3(L) 6.2(L)  Total Bilirubin 0.3 -  1.2 mg/dL 0.7 0.6 0.5  Alkaline Phos 38 - 126 U/L 144(H) 125 109  AST 15 - 41 U/L 72(H) 58(H) 46(H)  ALT 0 - 44 U/L 48(H) 47(H) 41    Lab Results  Component Value Date   WBC 4.6 03/15/2018   HGB 14.3 03/15/2018   HCT 44.6 03/15/2018   MCV 94.1 03/15/2018   PLT 174 03/15/2018   NEUTROABS 2.4 03/15/2018    ASSESSMENT & PLAN:  Primary cancer of lower-inner quadrant of left female breast ([place]) Left breast invasive lobular cancer diagnosed 02/20/2011 status post left mastectomy with axillary lymph node dissection, 6 cm ILC with LVI, PNI, 9/16 lymph nodes positive with extracapsular extension, ER 100%, PR 100%, HER-2 -1.31, Ki-67 18%, T3a N2 a M0 stage IIIa status post F AC chemotherapy 4 followed by radiation and currently on letrozole 2.5 mg daily since 10/16/2011  Relapse/recurrence: Elevated LFTs led to ultrasound of the liver which revealed liver metastases September 2019  PET CT scan 11/04/2017: Heavy burden of metastatic disease to the liver involving all lobes, largest 7.7 x 5.4 cm with SUV of 16.6, additional hypermetabolic left supraclavicular, left subpectoral, portacaval and aortocaval lymphadenopathy  Liver biopsy: 11/17/2017: Metastatic breast cancer ER 100%, PR 0%, HER-2 3+ positive CARIS molecular testing 11/12/2017:Revealed PIK3CA mutation, androgen receptor positivity, TMB 7 Mut/Mb  Potential future treatment options: CDK 4 and 6 inhibitors and Alpelisib; as well as other novel anti-HER-2 drugs like Enhertu -------------------------------------------------------------------------------------------------------------------------------------------- Current treatment: .Systemic chemotherapyHerceptin and Perjeta (held Herceptin and Perjeta initiallybecause of low cardiac ejection fractioncleared for use by Dr. Marzetta Board Faslodexstarted 12/17/2017(Taxotere was given 1025 [id number] dosediscontinued due to patient's inability to tolerate)  Toxicities: Denies any  adverse effects CT CAP 03/11/2018: Extensive hepatic metastatic disease significantly decreased, small left supraclavicular and borderline enlarged aortocaval lymph nodes and left pectoralis muscle PET CT scan 03/04/2018: Market reduction in hypermetabolic activity of liver metastases some of them resolved, reduction in metabolic activity of the retroperitoneal and periaortic lymph nodes  Based on excellent results on imaging, we will continue with the current maintenance therapy with Herceptin and Perjeta I reviewed the imaging with the patient and showed her the before and after images.  There has been a dramatic improvement in the liver metastatic disease.  We will plan to change her treatments to every 4 weeks for Herceptin and Perjeta and which we will coincide with her injection appointments for Faslodex.  I will see her once a month with labs and follow-up.  At some point we will change her treatment to single agent Herceptin based on her next scan in 3 months.   No  orders of the defined types were placed in this encounter.  The patient has a good understanding of the overall plan. she agrees with it. she will call with any problems that may develop before the next visit here.  Hailey Lose, MD 03/15/2018  Julious Oka Dorshimer am acting as scribe for Dr. Nicholas [name].  I have reviewed the above documentation for accuracy and completeness, and I agree with the above.

## 2018-03-15 ENCOUNTER — Inpatient Hospital Stay: Payer: Medicare HMO | Attending: Hematology and Oncology

## 2018-03-15 ENCOUNTER — Inpatient Hospital Stay: Payer: Medicare HMO

## 2018-03-15 ENCOUNTER — Inpatient Hospital Stay (HOSPITAL_BASED_OUTPATIENT_CLINIC_OR_DEPARTMENT_OTHER): Payer: Medicare HMO | Admitting: Hematology and Oncology

## 2018-03-15 DIAGNOSIS — Z9012 Acquired absence of left breast and nipple: Secondary | ICD-10-CM

## 2018-03-15 DIAGNOSIS — Z923 Personal history of irradiation: Secondary | ICD-10-CM | POA: Diagnosis not present

## 2018-03-15 DIAGNOSIS — C787 Secondary malignant neoplasm of liver and intrahepatic bile duct: Secondary | ICD-10-CM | POA: Insufficient documentation

## 2018-03-15 DIAGNOSIS — Z170 Estrogen receptor positive status [ER+]: Secondary | ICD-10-CM

## 2018-03-15 DIAGNOSIS — Z79899 Other long term (current) drug therapy: Secondary | ICD-10-CM

## 2018-03-15 DIAGNOSIS — C50312 Malignant neoplasm of lower-inner quadrant of left female breast: Secondary | ICD-10-CM | POA: Diagnosis not present

## 2018-03-15 DIAGNOSIS — Z5112 Encounter for antineoplastic immunotherapy: Secondary | ICD-10-CM | POA: Diagnosis present

## 2018-03-15 DIAGNOSIS — Z9221 Personal history of antineoplastic chemotherapy: Secondary | ICD-10-CM

## 2018-03-15 LAB — CBC WITH DIFFERENTIAL (CANCER CENTER ONLY)
Abs Immature Granulocytes: 0.02 10*3/uL (ref 0.00–0.07)
Basophils Absolute: 0.1 10*3/uL (ref 0.0–0.1)
Basophils Relative: 2 %
Eosinophils Absolute: 0.2 10*3/uL (ref 0.0–0.5)
Eosinophils Relative: 4 %
HCT: 44.6 % (ref 36.0–46.0)
Hemoglobin: 14.3 g/dL (ref 12.0–15.0)
Immature Granulocytes: 0 %
Lymphocytes Relative: 32 %
Lymphs Abs: 1.4 10*3/uL (ref 0.7–4.0)
MCH: 30.2 pg (ref 26.0–34.0)
MCHC: 32.1 g/dL (ref 30.0–36.0)
MCV: 94.1 fL (ref 80.0–100.0)
Monocytes Absolute: 0.5 10*3/uL (ref 0.1–1.0)
Monocytes Relative: 11 %
Neutro Abs: 2.4 10*3/uL (ref 1.7–7.7)
Neutrophils Relative %: 51 %
PLATELETS: 174 10*3/uL (ref 150–400)
RBC: 4.74 MIL/uL (ref 3.87–5.11)
RDW: 14.6 % (ref 11.5–15.5)
WBC Count: 4.6 10*3/uL (ref 4.0–10.5)
nRBC: 0 % (ref 0.0–0.2)

## 2018-03-15 LAB — CMP (CANCER CENTER ONLY)
ALT: 48 U/L — ABNORMAL HIGH (ref 0–44)
AST: 72 U/L — ABNORMAL HIGH (ref 15–41)
Albumin: 2.8 g/dL — ABNORMAL LOW (ref 3.5–5.0)
Alkaline Phosphatase: 144 U/L — ABNORMAL HIGH (ref 38–126)
Anion gap: 10 (ref 5–15)
BILIRUBIN TOTAL: 0.7 mg/dL (ref 0.3–1.2)
BUN: 16 mg/dL (ref 8–23)
CO2: 27 mmol/L (ref 22–32)
Calcium: 9.8 mg/dL (ref 8.9–10.3)
Chloride: 105 mmol/L (ref 98–111)
Creatinine: 0.95 mg/dL (ref 0.44–1.00)
GFR, Est AFR Am: >60 mL/min (ref >60)
GFR, Estimated: 58 mL/min — ABNORMAL LOW (ref >60)
Glucose, Bld: 142 mg/dL — ABNORMAL HIGH (ref 70–99)
POTASSIUM: 4 mmol/L (ref 3.5–5.1)
Sodium: 142 mmol/L (ref 135–145)
TOTAL PROTEIN: 6 g/dL — AB (ref 6.5–8.1)

## 2018-03-15 MED ORDER — KETOCONAZOLE 2 % EX CREA: 1.0000 "application " | TOPICAL_CREAM | Freq: Every day | CUTANEOUS | 0 refills | Status: AC

## 2018-03-15 MED ORDER — HEPARIN SOD (PORK) LOCK FLUSH 100 UNIT/ML IV SOLN
500.0000 [IU] | Freq: Once | INTRAVENOUS | Status: AC
  Administered 2018-03-15: 500 [IU]
  Filled 2018-03-15: qty 5

## 2018-03-15 MED ORDER — LORAZEPAM 0.5 MG PO TABS: 0.5000 mg | ORAL_TABLET | Freq: Every evening | ORAL | 0 refills | Status: AC | PRN

## 2018-03-15 MED ORDER — MAGNESIUM OXIDE 400 MG PO TABS: 400.0000 mg | ORAL_TABLET | Freq: Every day | ORAL | 3 refills | Status: AC

## 2018-03-15 MED ORDER — SODIUM CHLORIDE 0.9% FLUSH
10.0000 mL | Freq: Once | INTRAVENOUS | Status: AC
  Administered 2018-03-15: 10 mL
  Filled 2018-03-15: qty 10

## 2018-03-15 NOTE — Assessment & Plan Note (Signed)
Left breast invasive lobular cancer diagnosed 02/20/2011 status post left mastectomy with axillary lymph node dissection, 6 cm ILC with LVI, PNI, 9/16 lymph nodes positive with extracapsular extension, ER 100%, PR 100%, HER-2 -1.31, Ki-67 18%, T3a N2 a M0 stage IIIa status post F AC chemotherapy 4 followed by radiation and currently on letrozole 2.5 mg daily since 10/16/2011  Relapse/recurrence: Elevated LFTs led to ultrasound of the liver which revealed liver metastases September 2019  PET CT scan 11/04/2017: Heavy burden of metastatic disease to the liver involving all lobes, largest 7.7 x 5.4 cm with SUV of 16.6, additional hypermetabolic left supraclavicular, left subpectoral, portacaval and aortocaval lymphadenopathy  Liver biopsy: 11/17/2017: Metastatic breast cancer ER 100%, PR 0%, HER-2 3+ positive CARIS molecular testing 11/12/2017:Revealed PIK3CA mutation, androgen receptor positivity, TMB 7 Mut/Mb  Potential future treatment options: CDK 4 and 6 inhibitors and Alpelisib; as well as other novel anti-HER-2 drugs like Enhertu -------------------------------------------------------------------------------------------------------------------------------------------- Current treatment: .Systemic chemotherapyHerceptin and Perjeta (held Herceptin and Perjeta initiallybecause of low cardiac ejection fractioncleared for use by Dr. Marzetta Board Faslodexstarted 12/17/2017(Taxotere was given 1025 [id number] dosediscontinued due to patient's inability to tolerate)  Toxicities: Denies any adverse effects CT CAP 03/11/2018: Extensive hepatic metastatic disease significantly decreased, small left supraclavicular and borderline enlarged aortocaval lymph nodes and left pectoralis muscle PET CT scan 03/04/2018: Market reduction in hypermetabolic activity of liver metastases some of them resolved, reduction in metabolic activity of the retroperitoneal and periaortic lymph nodes  Based on excellent  results on imaging, we will continue with the current maintenance therapy with Herceptin and Perjeta

## 2018-03-17 ENCOUNTER — Inpatient Hospital Stay: Payer: Medicare HMO

## 2018-03-17 ENCOUNTER — Other Ambulatory Visit: Payer: Medicare HMO

## 2018-03-17 ENCOUNTER — Ambulatory Visit: Payer: Medicare HMO | Admitting: Adult Health

## 2018-03-17 ENCOUNTER — Encounter: Payer: Self-pay | Admitting: Hematology and Oncology

## 2018-03-17 VITALS — BP 117/61 | HR 78 | Temp 97.9°F | Resp 18

## 2018-03-17 DIAGNOSIS — Z95828 Presence of other vascular implants and grafts: Secondary | ICD-10-CM

## 2018-03-17 DIAGNOSIS — M8580 Other specified disorders of bone density and structure, unspecified site: Secondary | ICD-10-CM

## 2018-03-17 DIAGNOSIS — C50312 Malignant neoplasm of lower-inner quadrant of left female breast: Secondary | ICD-10-CM

## 2018-03-17 DIAGNOSIS — Z5112 Encounter for antineoplastic immunotherapy: Secondary | ICD-10-CM | POA: Diagnosis not present

## 2018-03-17 MED ORDER — DIPHENHYDRAMINE HCL 25 MG PO CAPS
50.0000 mg | ORAL_CAPSULE | Freq: Once | ORAL | Status: AC
  Administered 2018-03-17: 50 mg via ORAL

## 2018-03-17 MED ORDER — SODIUM CHLORIDE 0.9% FLUSH
10.0000 mL | INTRAVENOUS | Status: DC | PRN
  Administered 2018-03-17: 10 mL
  Filled 2018-03-17: qty 10

## 2018-03-17 MED ORDER — FULVESTRANT 250 MG/5ML IM SOLN
500.0000 mg | Freq: Once | INTRAMUSCULAR | Status: AC
  Administered 2018-03-17: 500 mg via INTRAMUSCULAR

## 2018-03-17 MED ORDER — TRASTUZUMAB CHEMO 150 MG IV SOLR
450.0000 mg | Freq: Once | INTRAVENOUS | Status: AC
  Administered 2018-03-17: 450 mg via INTRAVENOUS
  Filled 2018-03-17: qty 21.43

## 2018-03-17 MED ORDER — HEPARIN SOD (PORK) LOCK FLUSH 100 UNIT/ML IV SOLN
500.0000 [IU] | Freq: Once | INTRAVENOUS | Status: AC | PRN
  Administered 2018-03-17: 500 [IU]
  Filled 2018-03-17: qty 5

## 2018-03-17 MED ORDER — SODIUM CHLORIDE 0.9 % IV SOLN
420.0000 mg | Freq: Once | INTRAVENOUS | Status: AC
  Administered 2018-03-17: 420 mg via INTRAVENOUS
  Filled 2018-03-17: qty 14

## 2018-03-17 MED ORDER — FULVESTRANT 250 MG/5ML IM SOLN
INTRAMUSCULAR | Status: AC
  Filled 2018-03-17: qty 5

## 2018-03-17 MED ORDER — SODIUM CHLORIDE 0.9 % IV SOLN
Freq: Once | INTRAVENOUS | Status: AC
  Administered 2018-03-17: 14:00:00 via INTRAVENOUS
  Filled 2018-03-17: qty 250

## 2018-03-17 MED ORDER — DIPHENHYDRAMINE HCL 25 MG PO CAPS
ORAL_CAPSULE | ORAL | Status: AC
  Filled 2018-03-17: qty 2

## 2018-03-17 MED ORDER — ACETAMINOPHEN 325 MG PO TABS
ORAL_TABLET | ORAL | Status: AC
  Filled 2018-03-17: qty 2

## 2018-03-17 MED ORDER — ACETAMINOPHEN 325 MG PO TABS
650.0000 mg | ORAL_TABLET | Freq: Once | ORAL | Status: AC
  Administered 2018-03-17: 650 mg via ORAL

## 2018-03-17 NOTE — Patient Instructions (Signed)
[facility] Discharge Instructions for Patients Receiving Chemotherapy  Today you received the following chemotherapy agents Herceptin and Perjeta.   To help prevent nausea and vomiting after your treatment, we encourage you to take your nausea medication as directed.   If you develop nausea and vomiting that is not controlled by your nausea medication, call the clinic.   BELOW ARE SYMPTOMS THAT SHOULD BE REPORTED IMMEDIATELY:  *FEVER GREATER THAN 100.5 F  *CHILLS WITH OR WITHOUT FEVER  NAUSEA AND VOMITING THAT IS NOT CONTROLLED WITH YOUR NAUSEA MEDICATION  *UNUSUAL SHORTNESS OF BREATH  *UNUSUAL BRUISING OR BLEEDING  TENDERNESS IN MOUTH AND THROAT WITH OR WITHOUT PRESENCE OF ULCERS  *URINARY PROBLEMS  *BOWEL PROBLEMS  UNUSUAL RASH Items with * indicate a potential emergency and should be followed up as soon as possible.  Feel free to call the clinic should you have any questions or concerns. The clinic phone number is [phone].  Please show the CHEMO ALERT CARD at check-in to the Emergency Department and triage nurse.   

## 2018-03-17 NOTE — Progress Notes (Signed)
Pt declines to stay for 30-minute observation after Perjeta

## 2018-03-18 ENCOUNTER — Ambulatory Visit (HOSPITAL_COMMUNITY)
Admission: RE | Admit: 2018-03-18 | Discharge: 2018-03-18 | Disposition: A | Discharge status: Home / Self Care | Payer: Medicare HMO | Source: Ambulatory Visit | Attending: Internal Medicine | Admitting: Internal Medicine

## 2018-03-18 ENCOUNTER — Ambulatory Visit (HOSPITAL_BASED_OUTPATIENT_CLINIC_OR_DEPARTMENT_OTHER)
Admission: RE | Admit: 2018-03-18 | Discharge: 2018-03-18 | Disposition: A | Discharge status: Home / Self Care | Payer: Medicare HMO | Source: Ambulatory Visit | Attending: Internal Medicine | Admitting: Internal Medicine

## 2018-03-18 ENCOUNTER — Encounter (HOSPITAL_COMMUNITY): Payer: Self-pay | Admitting: Internal Medicine

## 2018-03-18 VITALS — BP 105/68 | HR 74 | Wt 166.6 lb

## 2018-03-18 DIAGNOSIS — Z853 Personal history of malignant neoplasm of breast: Secondary | ICD-10-CM | POA: Diagnosis not present

## 2018-03-18 DIAGNOSIS — R591 Generalized enlarged lymph nodes: Secondary | ICD-10-CM | POA: Insufficient documentation

## 2018-03-18 DIAGNOSIS — F419 Anxiety disorder, unspecified: Secondary | ICD-10-CM | POA: Insufficient documentation

## 2018-03-18 DIAGNOSIS — I420 Dilated cardiomyopathy: Secondary | ICD-10-CM | POA: Insufficient documentation

## 2018-03-18 DIAGNOSIS — C787 Secondary malignant neoplasm of liver and intrahepatic bile duct: Secondary | ICD-10-CM | POA: Diagnosis not present

## 2018-03-18 DIAGNOSIS — I5042 Chronic combined systolic (congestive) and diastolic (congestive) heart failure: Secondary | ICD-10-CM | POA: Diagnosis not present

## 2018-03-18 DIAGNOSIS — K219 Gastro-esophageal reflux disease without esophagitis: Secondary | ICD-10-CM | POA: Insufficient documentation

## 2018-03-18 DIAGNOSIS — I493 Ventricular premature depolarization: Secondary | ICD-10-CM | POA: Diagnosis not present

## 2018-03-18 DIAGNOSIS — E785 Hyperlipidemia, unspecified: Secondary | ICD-10-CM | POA: Diagnosis not present

## 2018-03-18 DIAGNOSIS — Z79899 Other long term (current) drug therapy: Secondary | ICD-10-CM | POA: Diagnosis not present

## 2018-03-18 DIAGNOSIS — Z79811 Long term (current) use of aromatase inhibitors: Secondary | ICD-10-CM | POA: Insufficient documentation

## 2018-03-18 DIAGNOSIS — Z794 Long term (current) use of insulin: Secondary | ICD-10-CM | POA: Insufficient documentation

## 2018-03-18 DIAGNOSIS — I491 Atrial premature depolarization: Secondary | ICD-10-CM | POA: Diagnosis not present

## 2018-03-18 DIAGNOSIS — C50919 Malignant neoplasm of unspecified site of unspecified female breast: Secondary | ICD-10-CM | POA: Insufficient documentation

## 2018-03-18 DIAGNOSIS — M8580 Other specified disorders of bone density and structure, unspecified site: Secondary | ICD-10-CM | POA: Diagnosis not present

## 2018-03-18 DIAGNOSIS — E1140 Type 2 diabetes mellitus with diabetic neuropathy, unspecified: Secondary | ICD-10-CM | POA: Diagnosis not present

## 2018-03-18 DIAGNOSIS — F329 Major depressive disorder, single episode, unspecified: Secondary | ICD-10-CM | POA: Insufficient documentation

## 2018-03-18 DIAGNOSIS — C50312 Malignant neoplasm of lower-inner quadrant of left female breast: Secondary | ICD-10-CM

## 2018-03-18 DIAGNOSIS — Z7722 Contact with and (suspected) exposure to environmental tobacco smoke (acute) (chronic): Secondary | ICD-10-CM | POA: Insufficient documentation

## 2018-03-18 DIAGNOSIS — I110 Hypertensive heart disease with heart failure: Secondary | ICD-10-CM | POA: Insufficient documentation

## 2018-03-18 MED ORDER — FUROSEMIDE 20 MG PO TABS: 20.0000 mg | ORAL_TABLET | ORAL | 6 refills | Status: AC | PRN

## 2018-03-18 MED ORDER — LOSARTAN POTASSIUM 25 MG PO TABS: 25.0000 mg | ORAL_TABLET | Freq: Every day | ORAL | 6 refills | Status: AC

## 2018-03-18 MED ORDER — POTASSIUM CHLORIDE CRYS ER 20 MEQ PO TBCR: 20.0000 meq | EXTENDED_RELEASE_TABLET | ORAL | 3 refills | Status: AC | PRN

## 2018-03-18 NOTE — Progress Notes (Signed)
  Echocardiogram 2D Echocardiogram has been performed.  Jennette Dubin 03/18/2018, 2:25 PM

## 2018-03-18 NOTE — Patient Instructions (Addendum)
STOP HERCEPTIN  INCREASE Cozaar 25mg  (1 tab) daily  TAKE Lasix 20mg  (1 tab) as needed for swelling  TAKE Potassium 73meq on days you take Lasix 20mg  for swelling  You have been referred for a Cardiac MRI. They will call you to schedule this appointment.   Your physician has requested that you have an echocardiogram. Echocardiography is a painless test that uses sound waves to create images of your heart. It provides your doctor with information about the size and shape of your heart and how well your heart's chambers and valves are working. This procedure takes approximately one hour. There are no restrictions for this procedure.  Your physician recommends that you schedule a follow-up appointment in: 6-8 weeks.

## 2018-03-18 NOTE — Progress Notes (Signed)
Cardio-Oncology Clinic Note   Referring Physician: Dr. Lindi Adie Primary Care: Vicenta Aly, FNP Primary Cardiologist: Dr. Caffie Damme  HPI:  [name] is a 78 y.o. female with past medical history of chronic systolic HF due to NICM (EF 35%), HTN, HLD, DM2, and metastatic breast cancer to liver who has been referred by Dr. Lindi Adie to establish in the cardio-oncology clinic for monitoring of cardio-toxicity while undergoing chemotherapy.    Primary cancer of lower-inner quadrant of left female breast ([place])   02/20/2011 Initial Diagnosis    Left Breast invasive lobular carcinoma; 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 Her clinical staging was stage IIB (T3, N0, M0).      03/23/2011 Surgery    L mastectomy + Axillary LND 6 cm ILC with LVI, PNI; 9/16 LN positive with Extracapsular ext ER 100%; PR 100%; Her 2 1.31; Ki 67: 18; pT3a N2a (stage IIIA)    04/17/2011 - 06/19/2011 Chemotherapy    FEC X 4 complicated by febrile neutropenis, spesis and chest wall abscess    10/07/2011 - 11/24/2011 Radiation Therapy    Left chest wall and axilla 5040 cGy in 28 # with Boost    10/16/2011 -  Anti-estrogen oral therapy    Letrozole 2.5 mg po daily    05/27/2015 - 05/30/2015 Hospital Admission    Acute systolic CHF, dilated cardiomyopathy, EF 35-40%    08/09/2015 Surgery    Right lumpectomy: Fibrocystic changes with calcifications, right nipple lesion: Seborrheic keratosis no evidence of malignancy    10/20/2017 Imaging    Elevated LFTs letter ultrasound of the liver which showed liver metastases    11/04/2017 PET scan    Heavy burden of metastatic disease to the liver involving all lobes, index 7.7 centimeters right hepatic lobe mass SUV 16.6, small but hypermetabolic left supraclavicular, left subpectoral, portacaval, aortocaval adenopathy    11/12/2017 Pathology Results    Liver biopsy: Metastatic breast cancer, ER  100%, PR 0%, Ki-67 10%, HER-2 3+ positive    11/19/2017 -  Chemotherapy    The patient had pegfilgrastim-cbqv (UDENYCA) injection 6 mg, 6 mg, Subcutaneous, Once, 1 of 6 cycles Administration: 6 mg (11/29/2017) trastuzumab (HERCEPTIN) 609 mg in sodium chloride 0.9 % 250 mL chemo infusion, 8 mg/kg = 609 mg, Intravenous,  Once, 1 of 6 cycles DOCEtaxel (TAXOTERE) 140 mg in sodium chloride 0.9 % 250 mL chemo infusion, 75 mg/m2 = 140 mg, Intravenous,  Once, 1 of 6 cycles Administration: 140 mg (11/26/2017) pertuzumab (PERJETA) 840 mg in sodium chloride 0.9 % 250 mL chemo infusion, 840 mg, Intravenous, Once, 1 of 6 cycles  for chemotherapy treatment.     Treated in 4/13 for left breast CA with FEC (including epirubicin)  Had elevated LFTs and found to have extensive liver mets + chest nodes.   EF 2017 35-40% Cath in 05/2015 with no CAD.  Echo 11/24/17 LVEF 30-35%, Grade 1 DD, Mild PI, significant dyssynchrony.   PET CT scan 11/04/2017: Heavy burden of metastatic disease to the liver involving all lobes, largest 7.7 x 5.4 cm with SUV of 16.6, additional hypermetabolic left supraclavicular, left subpectoral, portacaval and aortocaval lymphadenopathy  US Biopsy 11/12/17 positive for HER2 (3) tumor cells  In 11/19 Holter montior placed for frequent PVCs. Found to have 14% burden. Amio 200 daily started 01/02/18  Here for f/u: Has had 4 rounds of Herceptin/perjeta and taxotere so far. No problems with CP, SOB, edema.    Past Medical History:  Diagnosis Date  . ALLERGIC RHINITIS 12/13/2007  .  ANXIETY 06/04/2008  . Blood transfusion 1964   post childbirth  . 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 Chronic combined systolic (congestive) and diastolic (congestive) heart failure ([place])   . DEGENERATIVE JOINT DISEASE 12/17/2006  . DEPRESSION 08/10/2007  . Dilated cardiomyopathy ([place]) 05/29/2015   a. Dx 05/2015 - EF 35-40% - felt secondary to XRT and  chemo for breast CA. Normal coronaries by cath.  . DM type 2 (diabetes mellitus, type 2) ([place])   . Dyspnea    sometimes when walking , sometimes when sitting  . Essential hypertension   . GERD 12/17/2006  . History of blood transfusion    after child birth  . History of chemotherapy   . History of hiatal hernia   . History of radiation therapy 10/07/11-11/24/11   left breast,5040 cGy/28 sessions,l chest wall boost=1000cGy/5 sesions  . 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 Hyperlipidemia   . 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 OBESITY 06/04/2008  . 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 Pneumonia   . Premature atrial contractions   . Sinus bradycardia    a. 2018 event monitor which showed NSR with average HR 71, frequent PACs, occasional PVCs, and sinus bradycardia as low as 50.    Current Outpatient Medications  Medication Sig Dispense Refill  . acyclovir (ZOVIRAX) 200 MG capsule TAKE 2 CAPSULES BY MOUTH 3 TIMES DAILY (Patient taking differently: Take 400 mg by mouth 3 (three) times daily. ) 540 capsule 3  . ALPRAZolam (XANAX) 0.5 MG tablet TAKE ONE-HALF TO ONE TABLET BY MOUTH THREE TIMES DAILY AS NEEDED FOR  NERVES (Patient taking differently: Take 0.5 mg by mouth 3 (three) times daily as needed for anxiety. ) 270 tablet 1  . amiodarone (PACERONE) 200 MG tablet Take 1 tablet (200 mg total) by mouth daily. 30 tablet 3  . amoxicillin (AMOXIL) 500 MG capsule Take four capsules one hour before dental appointment. 4 capsule 1  . carvedilol (COREG) 6.25 MG tablet TAKE 1 TABLET TWICE DAILY WITH MEALS (Patient taking differently: Take 6.25 mg by mouth 2 (two) times daily with a meal. ) 180 tablet 3  . cholecalciferol (VITAMIN D) 1000 UNITS tablet Take 1 tablet (1,000 Units total) by mouth daily. 90 tablet 3  . cyanocobalamin 500 MCG tablet Take 1 tablet (500 mcg total) by mouth daily. 90 tablet 3  . Insulin Pen Needle (RELION PEN NEEDLE 31G/8MM) 31G X 8 MM MISC 1 Device by Does not apply route 4 (four) times daily.  120 each 11  . ketoconazole (NIZORAL) 2 % cream Apply 1 application topically daily. 15 g 0  . letrozole (FEMARA) 2.5 MG tablet Take 2.5 mg by mouth daily.     Marland Kitchen lidocaine-prilocaine (EMLA) cream Apply to affected area once (Patient taking differently: Apply 1 application topically once. Apply to affected area once) 30 g 3  . liraglutide (VICTOZA) 18 MG/3ML SOPN Inject 0.3 mLs (1.8 mg total) into the skin daily.    Marland Kitchen LORazepam (ATIVAN) 0.5 MG tablet Take 1 tablet (0.5 mg total) by mouth at bedtime as needed for sleep. 30 tablet 0  . losartan (COZAAR) 25 MG tablet Take 0.5 tablets (12.5 mg total) by mouth at bedtime. 15 tablet 6  . magnesium oxide (MAG-OX) 400 MG tablet Take 1 tablet (400 mg total) by mouth daily. 60 tablet 3  . Multiple Vitamins-Minerals (ONE-A-DAY WOMENS 50+ ADVANTAGE) TABS Take 1 tablet by  mouth daily. 90 tablet 3  . omeprazole (PRILOSEC) 40 MG capsule Take 40 mg by mouth daily.    . sertraline (ZOLOFT) 50 MG tablet Take 1 tablet (50 mg total) by mouth daily. 90 tablet 3  . spironolactone (ALDACTONE) 25 MG tablet Take 0.5 tablets (12.5 mg total) by mouth every Monday, Wednesday, and Friday. 30 tablet 6   No current facility-administered medications for this encounter.     Allergies  Allergen Reactions  . Cephalexin Hives    Patient indicates that she has used amoxicillin without hives or other allergic reaction.  . Clindamycin Hives  . Oxycodone-Acetaminophen Hives and Itching  . Rocephin [Ceftriaxone Sodium In Dextrose] Hives  . Sulfonamide Derivatives Hives  . Dilaudid [Hydromorphone Hcl] Itching      Social History   Socioeconomic History  . Marital status: Married    Spouse name: Not on file  . Number of children: 3  . Years of education: Not on file  . Highest education level: Not on file  Occupational History    Employer: RETIRED  Social Needs  . Financial resource strain: Not on file  . Food insecurity:    Worry: Not on file    Inability: Not on  file  . Transportation needs:    Medical: Not on file    Non-medical: Not on file  Tobacco Use  . Smoking status: Never Smoker  . Smokeless tobacco: Never Used  Substance and Sexual Activity  . Alcohol use: No  . Drug use: No  . Sexual activity: Yes  Lifestyle  . Physical activity:    Days per week: Not on file    Minutes per session: Not on file  . Stress: Not on file  Relationships  . Social connections:    Talks on phone: Not on file    Gets together: Not on file    Attends religious service: Not on file    Active member of club or organization: Not on file    Attends meetings of clubs or organizations: Not on file    Relationship status: Not on file  . Intimate partner violence:    Fear of current or ex partner: Not on file    Emotionally abused: Not on file    Physically abused: Not on file    Forced sexual activity: Not on file  Other Topics Concern  . 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 COPD Mother   . Arthritis Mother   . Emphysema Mother   . Diabetes Brother   . Cancer Brother        prostate  . Cancer Father        malignant brain tumor  . Mental illness Paternal Grandfather   . Heart disease Other        Sibling  . Diabetes Other        Sibling    Vitals:   03/18/18 1408  BP: 105/68  Pulse: 74  SpO2: 96%  Weight: 75.6 kg (166 lb 9.6 oz)   Wt Readings from Last 3 Encounters:  03/18/18 75.6 kg (166 lb 9.6 oz)  03/15/18 79.4 kg (175 lb)  02/15/18 72.6 kg (160 lb)  PHYSICAL EXAM: General:  Well appearing. No resp difficulty HEENT: normal hair growing back  Neck: supple. no JVD. Carotids 2+ bilat; no bruits. No lymphadenopathy or thryomegaly appreciated. Cor: PMI nondisplaced. Regular rate & rhythm. No rubs, gallops or  murmurs. Lungs: clear Abdomen: obese soft, nontender, nondistended. No hepatosplenomegaly. No bruits or masses. Good bowel sounds. Extremities: no cyanosis, clubbing, rash, edema Neuro: alert & orientedx3, cranial nerves grossly intact. moves all 4 extremities w/o difficulty. Affect pleasant   EKG 12/13/17 sinus tach 103 bpm, QRS 98 ms  ASSESSMENT & PLAN:  1. Breast Cancer with liver met by biopsy 11/12/17 - Last chemo 10/25, last Neulasta on 10/28 - Taxotere/Herceptin and Perjeta on hold currently with recent low LVEF. Per Dr. Haroldine Laws, would recommend starting.   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 Possibly exacerbated by Herceptin - Will need to stop Herceptin for now.   2. Chronic systolic and diastolic CHF due to NICM - Echo 11/24/17 LVEF 30-35% with diffuse HK and grade 1 DD + dyssynchrony - Cath in 2017 showed normal coronaries.  - Holter 11/19 with 14% PVCs. Felt possibly contributing to cardiomyopathy but PVCs suppressed without improvement - Echo today reviewed personally EF 20-25% - NYHA II symptoms,  - Volume status OK on exam.   - Continue coreg 6.25 mg BID - Continue spironolactone 12.5 mg daily.  - Increase losartan to 25 daily - Will give her lasix to use prn for volume overload - Proceed with cMRI - Hold Herceptin - Repeat echo 2 months  3. Frequent PVCs - Holter 11/19 with 14% PVCs. Felt possibly contributing to cardiomyopathy but PVCs suppressed without improvement - Continue amio for now  Glori Bickers, MD  2:27 PM

## 2018-03-21 ENCOUNTER — Telehealth: Payer: Self-pay

## 2018-03-21 ENCOUNTER — Encounter: Payer: Self-pay | Admitting: Hematology and Oncology

## 2018-03-21 NOTE — Telephone Encounter (Signed)
Nurse reviewed previous messages regarding LFT/and echocardiogram results with Dr. Lindi Adie.  Per Dr. Geralyn Flash recommendations - Hold Herceptin/Perjeta infusions until EF is significant.  Pt is being monitored by Dr. Haroldine Laws.  Pt concerned with not having any immunotherapy - Dr. Lindi Adie gave orders for patient to start Anastrozole 1mg  daily until we resume immunotherapy, along with monthly labs to evaluate liver function.  Patient voiced understanding and agreement.  Pt voiced wishes to keep upcoming appointment for MD.  No further needs at this time.

## 2018-03-25 ENCOUNTER — Encounter (HOSPITAL_COMMUNITY): Payer: Self-pay

## 2018-03-27 ENCOUNTER — Emergency Department (HOSPITAL_COMMUNITY)
Admission: EM | Admit: 2018-03-27 | Discharge: 2018-03-27 | Disposition: A | Discharge status: Home / Self Care | Payer: Medicare Other | Source: Home / Self Care | Attending: Emergency Medicine | Admitting: Emergency Medicine

## 2018-03-27 ENCOUNTER — Encounter (HOSPITAL_COMMUNITY): Payer: Self-pay | Admitting: Emergency Medicine

## 2018-03-27 ENCOUNTER — Other Ambulatory Visit: Payer: Self-pay

## 2018-03-27 ENCOUNTER — Emergency Department (HOSPITAL_COMMUNITY): Payer: Medicare Other

## 2018-03-27 DIAGNOSIS — R0981 Nasal congestion: Secondary | ICD-10-CM

## 2018-03-27 DIAGNOSIS — E440 Moderate protein-calorie malnutrition: Secondary | ICD-10-CM | POA: Diagnosis not present

## 2018-03-27 DIAGNOSIS — I427 Cardiomyopathy due to drug and external agent: Secondary | ICD-10-CM | POA: Diagnosis not present

## 2018-03-27 DIAGNOSIS — Z7722 Contact with and (suspected) exposure to environmental tobacco smoke (acute) (chronic): Secondary | ICD-10-CM | POA: Diagnosis not present

## 2018-03-27 DIAGNOSIS — E7800 Pure hypercholesterolemia, unspecified: Secondary | ICD-10-CM | POA: Diagnosis not present

## 2018-03-27 DIAGNOSIS — M8580 Other specified disorders of bone density and structure, unspecified site: Secondary | ICD-10-CM | POA: Diagnosis not present

## 2018-03-27 DIAGNOSIS — C50312 Malignant neoplasm of lower-inner quadrant of left female breast: Secondary | ICD-10-CM | POA: Insufficient documentation

## 2018-03-27 DIAGNOSIS — I5042 Chronic combined systolic (congestive) and diastolic (congestive) heart failure: Secondary | ICD-10-CM

## 2018-03-27 DIAGNOSIS — Z170 Estrogen receptor positive status [ER+]: Secondary | ICD-10-CM | POA: Diagnosis not present

## 2018-03-27 DIAGNOSIS — I472 Ventricular tachycardia: Secondary | ICD-10-CM | POA: Diagnosis not present

## 2018-03-27 DIAGNOSIS — Z79899 Other long term (current) drug therapy: Secondary | ICD-10-CM | POA: Insufficient documentation

## 2018-03-27 DIAGNOSIS — E785 Hyperlipidemia, unspecified: Secondary | ICD-10-CM | POA: Diagnosis not present

## 2018-03-27 DIAGNOSIS — I110 Hypertensive heart disease with heart failure: Secondary | ICD-10-CM | POA: Insufficient documentation

## 2018-03-27 DIAGNOSIS — E119 Type 2 diabetes mellitus without complications: Secondary | ICD-10-CM

## 2018-03-27 DIAGNOSIS — K219 Gastro-esophageal reflux disease without esophagitis: Secondary | ICD-10-CM | POA: Diagnosis not present

## 2018-03-27 DIAGNOSIS — Z6826 Body mass index (BMI) 26.0-26.9, adult: Secondary | ICD-10-CM | POA: Diagnosis not present

## 2018-03-27 DIAGNOSIS — Z794 Long term (current) use of insulin: Secondary | ICD-10-CM | POA: Insufficient documentation

## 2018-03-27 DIAGNOSIS — M6281 Muscle weakness (generalized): Secondary | ICD-10-CM | POA: Diagnosis not present

## 2018-03-27 DIAGNOSIS — J209 Acute bronchitis, unspecified: Secondary | ICD-10-CM | POA: Diagnosis not present

## 2018-03-27 DIAGNOSIS — T451X5A Adverse effect of antineoplastic and immunosuppressive drugs, initial encounter: Secondary | ICD-10-CM | POA: Diagnosis not present

## 2018-03-27 DIAGNOSIS — I5043 Acute on chronic combined systolic (congestive) and diastolic (congestive) heart failure: Secondary | ICD-10-CM | POA: Diagnosis not present

## 2018-03-27 DIAGNOSIS — I493 Ventricular premature depolarization: Secondary | ICD-10-CM | POA: Diagnosis not present

## 2018-03-27 DIAGNOSIS — C787 Secondary malignant neoplasm of liver and intrahepatic bile duct: Secondary | ICD-10-CM | POA: Diagnosis not present

## 2018-03-27 DIAGNOSIS — I5023 Acute on chronic systolic (congestive) heart failure: Secondary | ICD-10-CM | POA: Diagnosis present

## 2018-03-27 DIAGNOSIS — J811 Chronic pulmonary edema: Secondary | ICD-10-CM | POA: Diagnosis not present

## 2018-03-27 DIAGNOSIS — E876 Hypokalemia: Secondary | ICD-10-CM | POA: Diagnosis not present

## 2018-03-27 DIAGNOSIS — I420 Dilated cardiomyopathy: Secondary | ICD-10-CM | POA: Diagnosis not present

## 2018-03-27 DIAGNOSIS — R0902 Hypoxemia: Secondary | ICD-10-CM | POA: Diagnosis not present

## 2018-03-27 LAB — COMPREHENSIVE METABOLIC PANEL
ALT: 41 U/L (ref 0–44)
AST: 70 U/L — AB (ref 15–41)
Albumin: 2.8 g/dL — ABNORMAL LOW (ref 3.5–5.0)
Alkaline Phosphatase: 131 U/L — ABNORMAL HIGH (ref 38–126)
Anion gap: 7 (ref 5–15)
BUN: 15 mg/dL (ref 8–23)
CO2: 25 mmol/L (ref 22–32)
CREATININE: 0.79 mg/dL (ref 0.44–1.00)
Calcium: 9.1 mg/dL (ref 8.9–10.3)
Chloride: 107 mmol/L (ref 98–111)
Glucose, Bld: 167 mg/dL — ABNORMAL HIGH (ref 70–99)
Potassium: 3.7 mmol/L (ref 3.5–5.1)
Sodium: 139 mmol/L (ref 135–145)
Total Bilirubin: 1.1 mg/dL (ref 0.3–1.2)
Total Protein: 5.7 g/dL — ABNORMAL LOW (ref 6.5–8.1)

## 2018-03-27 LAB — CBC WITH DIFFERENTIAL/PLATELET
Abs Immature Granulocytes: 0.03 10*3/uL (ref 0.00–0.07)
Basophils Absolute: 0.1 10*3/uL (ref 0.0–0.1)
Basophils Relative: 1 %
Eosinophils Absolute: 0.1 10*3/uL (ref 0.0–0.5)
Eosinophils Relative: 1 %
HCT: 39.8 % (ref 36.0–46.0)
HEMOGLOBIN: 12.6 g/dL (ref 12.0–15.0)
Immature Granulocytes: 1 %
Lymphocytes Relative: 19 %
Lymphs Abs: 1.1 10*3/uL (ref 0.7–4.0)
MCH: 31 pg (ref 26.0–34.0)
MCHC: 31.7 g/dL (ref 30.0–36.0)
MCV: 98 fL (ref 80.0–100.0)
Monocytes Absolute: 0.7 10*3/uL (ref 0.1–1.0)
Monocytes Relative: 12 %
NRBC: 0 % (ref 0.0–0.2)
Neutro Abs: 3.9 10*3/uL (ref 1.7–7.7)
Neutrophils Relative %: 66 %
Platelets: 141 10*3/uL — ABNORMAL LOW (ref 150–400)
RBC: 4.06 MIL/uL (ref 3.87–5.11)
RDW: 15 % (ref 11.5–15.5)
WBC: 5.9 10*3/uL (ref 4.0–10.5)

## 2018-03-27 LAB — BRAIN NATRIURETIC PEPTIDE: B Natriuretic Peptide: 257.4 pg/mL — ABNORMAL HIGH (ref 0.0–100.0)

## 2018-03-27 MED ORDER — HEPARIN SOD (PORK) LOCK FLUSH 100 UNIT/ML IV SOLN
500.0000 [IU] | Freq: Once | INTRAVENOUS | Status: AC
  Administered 2018-03-27: 500 [IU]
  Filled 2018-03-27: qty 5

## 2018-03-27 MED ORDER — TRIAMCINOLONE ACETONIDE 55 MCG/ACT NA AERO: 2.0000 | INHALATION_SPRAY | Freq: Every day | NASAL | 0 refills | Status: DC

## 2018-03-27 MED ORDER — CHLORPHENIRAMINE-DM 4-30 MG PO TABS: ORAL_TABLET | ORAL | 0 refills | Status: AC

## 2018-03-27 MED ORDER — TRIAMCINOLONE ACETONIDE 55 MCG/ACT NA AERO: 2.0000 | INHALATION_SPRAY | Freq: Every day | NASAL | 0 refills | Status: AC

## 2018-03-27 NOTE — ED Notes (Signed)
ED Provider at bedside. 

## 2018-03-27 NOTE — ED Triage Notes (Signed)
Pt with cough, chest congestion and sore throat since yesterday. Pt states she has a productive cough with yellow sputum.

## 2018-03-27 NOTE — ED Provider Notes (Addendum)
[facility] DEPT Provider Note   CSN: [id number] Arrival date & time: 03/27/18  0831    History   Chief Complaint Chief Complaint  Patient presents with  . Shortness of Breath    HPI [name] is a 78 y.o. female.     HPI [name] is a 78 y.o. female with past medical history of chronic systolic HF due to NICM (EF 35%), HTN, HLD, DM2, and metastatic breast cancer to liver.  She presents today due to facial fullness, sinus congestion, persistent dyspnea. She denies any fever, states the dyspnea is essentially unchanged. However, over the past day or so she has developed the aforementioned concerns. No new medication taken for relief With concern for her ongoing cancer therapy, congestive heart failure per she presents for evaluation.  Past Medical History:  Diagnosis Date  . ALLERGIC RHINITIS 12/13/2007  . ANXIETY 06/04/2008  . Blood transfusion 1964   post childbirth  . 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 Chronic combined systolic (congestive) and diastolic (congestive) heart failure ([place])   . DEGENERATIVE JOINT DISEASE 12/17/2006  . DEPRESSION 08/10/2007  . Dilated cardiomyopathy ([place]) 05/29/2015   a. Dx 05/2015 - EF 35-40% - felt secondary to XRT and chemo for breast CA. Normal coronaries by cath.  . DM type 2 (diabetes mellitus, type 2) ([place])   . Dyspnea    sometimes when walking , sometimes when sitting  . Essential hypertension   . GERD 12/17/2006  . History of blood transfusion    after child birth  . History of chemotherapy   . History of hiatal hernia   . History of radiation therapy 10/07/11-11/24/11   left breast,5040 cGy/28 sessions,l chest wall boost=1000cGy/5 sesions  . 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 Hyperlipidemia   . 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 OBESITY 06/04/2008  . 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 Pneumonia   . Premature atrial contractions   .  Sinus bradycardia    a. 2018 event monitor which showed NSR with average HR 71, frequent PACs, occasional PVCs, and sinus bradycardia as low as 50.    Patient Active Problem List   Diagnosis Date Noted  . Metastatic breast cancer ([place]) 12/03/2017  . Neutropenic fever ([place]) 12/03/2017  . Port-A-Cath in place 12/01/2017  . PVC's (premature ventricular contractions) 05/28/2016  . NICM (nonischemic cardiomyopathy) ([place]) 05/28/2016  . Premature atrial contractions 04/21/2016  . Chronic combined systolic and diastolic CHF (congestive heart failure) ([place]) 07/17/2015  . Dilated cardiomyopathy ([place]) 05/29/2015  . Recurrent major depressive disorder ([place]) 02/28/2015  . Acute bronchitis 12/19/2014  . Acid reflux 06/20/2014  . Herpes 06/20/2014  . 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 H/O malignant neoplasm of breast 03/15/2013  . HLD (hyperlipidemia) 03/15/2013  . Long term current use of insulin ([place]) 03/15/2013  . 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 Controlled type 2 diabetes mellitus without complication ([place]) 93/79/0240  . Mixed incontinence 03/15/2013  . Macular degeneration 03/02/2013  . LBP (low back pain) 02/23/2013  . Neutropenia with fever ([place] [address] [place]) 06/25/2011  . Open wound of left breast 05/22/2011  . Primary cancer of lower-inner quadrant of left female breast ([place]) 02/20/2011  . DM type 2 (diabetes mellitus, type 2) ([place]) 08/10/2007  . DEPRESSION 08/10/2007  . HYPERCHOLESTEROLEMIA 12/17/2006  . Essential hypertension 12/17/2006  . GERD 12/17/2006    Past Surgical History:  Procedure Laterality Date  . bladder tack  1974  . BREAST EXCISIONAL BIOPSY  11/10/1988   left benign Dr Margot Chimes  . BREAST EXCISIONAL BIOPSY  03/04/1983   Right - two areas - Dr Margot Chimes  . BREAST EXCISIONAL BIOPSY  10/09/1980   right - Dr Margot Chimes  . BREAST EXCISIONAL BIOPSY  10/18/1979   left - Dr Margot Chimes  . BREAST EXCISIONAL BIOPSY  01/02/1994   right - Dr Margot Chimes  . BREAST EXCISIONAL BIOPSY   2017   right  . BREAST SURGERY    . CARDIAC CATHETERIZATION N/A 05/29/2015   Procedure: Left Heart Cath and Coronary Angiography;  Surgeon: Belva Crome, MD;  Location: [facility];  Service: Cardiovascular;  Laterality: N/A;  . CHOLECYSTECTOMY  02/23/1991   LC - Dr Margot Chimes  . COLONOSCOPY    . ENTEROCELE REPAIR  06/08   Dr. Cletis Media  . EVACUATION BREAST HEMATOMA  07/01/2011   Procedure: EVACUATION HEMATOMA BREAST;  Surgeon: Haywood Lasso, MD;  Location: [facility];  Service: General;  Laterality: Left;  Drainage of left mastectomy seroma  . EYE SURGERY Right    catartact  . HIP SURGERY Right 08/27/2010   DR. Maureen Ralphs-  "burcitis removal"  . LAMINECTOMY  1993   s/p-Dr. Rita Ohara  . MASTECTOMY Left 03/23/2011  . MODIFIED RADICAL MASTECTOMY W/ AXILLARY LYMPH NODE DISSECTION Left 03-30-11  . PORT-A-CATH REMOVAL  12/02/2011   Procedure: REMOVAL PORT-A-CATH;  Surgeon: Haywood Lasso, MD;  Location: [facility];  Service: General;  Laterality: N/A;  . PORTACATH PLACEMENT  04/13/2011   Procedure: INSERTION PORT-A-CATH;  Surgeon: Haywood Lasso, MD;  Location: [facility];  Service: General;  Laterality: N/A;  porta cath placement,removal of J-P drain  . PORTACATH PLACEMENT Right 11/25/2017   Procedure: INSERTION PORT-A-CATH WITH [place];  Surgeon: Rolm Bookbinder, MD;  Location: [facility];  Service: General;  Laterality: Right;  . RADIOACTIVE SEED GUIDED EXCISIONAL BREAST BIOPSY Right 08/09/2015   Procedure: RIGHT RADIOACTIVE SEED GUIDED EXCISIONAL BREAST BIOPSY, RIGHT NIPPLE LESION EXCISION;  Surgeon: Rolm Bookbinder, MD;  Location: [facility];  Service: General;  Laterality: Right;  . ROTATOR CUFF REPAIR  8/03   right, Dr. Tonita Cong  . ROTATOR CUFF REPAIR  10/26/2008   left, Dr. Rush Farmer  . TONSILLECTOMY    . VAGINAL HYSTERECTOMY  1974   partial      OB History    Gravida  5   Para  3   Term      Preterm      AB      Living  3      SAB      TAB      Ectopic      Multiple      Live Births               Home Medications    Prior to Admission medications   Medication Sig Start Date End Date Taking? Authorizing Provider  acyclovir (ZOVIRAX) 200 MG capsule TAKE 2 CAPSULES BY MOUTH 3 TIMES DAILY Patient taking differently: Take 400 mg by mouth 3 (three) times daily.  10/23/13  Yes Noralee Space, MD  ALPRAZolam Duanne Moron) 0.5 MG tablet TAKE ONE-HALF TO ONE TABLET BY MOUTH THREE TIMES DAILY AS NEEDED FOR  NERVES Patient taking differently: Take 0.5 mg by mouth 3 (three) times daily as needed for anxiety.  02/23/13  Yes Noralee Space, MD  amiodarone (PACERONE) 200 MG tablet Take  1 tablet (200 mg total) by mouth daily. 01/04/18  Yes Bensimhon, Shaune Pascal, MD  carvedilol (COREG) 6.25 MG tablet TAKE 1 TABLET TWICE DAILY WITH MEALS Patient taking differently: Take 6.25 mg by mouth 2 (two) times daily with a meal.  12/27/17  Yes Turner, Traci R, MD  cholecalciferol (VITAMIN D) 1000 UNITS tablet Take 1 tablet (1,000 Units total) by mouth daily. 02/23/13  Yes Noralee Space, MD  cyanocobalamin 500 MCG tablet Take 1 tablet (500 mcg total) by mouth daily. 02/23/13  Yes Noralee Space, MD  furosemide (LASIX) 20 MG tablet Take 1 tablet (20 mg total) by mouth as needed (for swelling). 03/18/18 06/16/18 Yes Bensimhon, Shaune Pascal, MD  Insulin Pen Needle (RELION PEN NEEDLE 31G/8MM) 31G X 8 MM MISC 1 Device by Does not apply route 4 (four) times daily. 10/05/12  Yes Renato Shin, MD  ketoconazole (NIZORAL) 2 % cream Apply 1 application topically daily. 03/15/18  Yes Nicholas Lose, MD  letrozole [facility]) 2.5 MG tablet Take 2.5 mg by mouth daily.  12/14/17  Yes [provider]  liraglutide (VICTOZA) 18 MG/3ML SOPN Inject 0.3 mLs (1.8 mg total) into the skin daily. 12/16/17  Yes Nicholas Lose, MD  LORazepam (ATIVAN) 0.5 MG tablet Take 1 tablet (0.5 mg total) by mouth at bedtime as needed for sleep. 03/15/18  Yes Nicholas Lose, MD  losartan  (COZAAR) 25 MG tablet Take 1 tablet (25 mg total) by mouth at bedtime. 03/18/18 06/16/18 Yes Bensimhon, Shaune Pascal, MD  magnesium oxide (MAG-OX) 400 MG tablet Take 1 tablet (400 mg total) by mouth daily. 03/15/18  Yes Nicholas Lose, MD  Multiple Vitamins-Minerals (ONE-A-DAY WOMENS 50+ ADVANTAGE) TABS Take 1 tablet by mouth daily. 02/23/13  Yes Noralee Space, MD  omeprazole (PRILOSEC) 40 MG capsule Take 40 mg by mouth daily.   Yes [provider]  potassium chloride SA (K-DUR,KLOR-CON) 20 MEQ tablet Take 1 tablet (20 mEq total) by mouth as needed (take with lasix as needed). 03/18/18  Yes Bensimhon, Shaune Pascal, MD  sertraline (ZOLOFT) 50 MG tablet Take 1 tablet (50 mg total) by mouth daily. 02/23/13  Yes Noralee Space, MD  spironolactone (ALDACTONE) 25 MG tablet Take 0.5 tablets (12.5 mg total) by mouth every Monday, Wednesday, and Friday. 12/06/17  Yes Florencia Reasons, MD  amoxicillin (AMOXIL) 500 MG capsule Take four capsules one hour before dental appointment. 01/19/18   Lenn Cal, DDS  lidocaine-prilocaine (EMLA) cream Apply to affected area once Patient not taking: Reported on 03/27/2018 11/19/17   Nicholas Lose, MD    Family History Family History  Problem Relation Age of Onset  . COPD Mother   . Arthritis Mother   . Emphysema Mother   . Diabetes Brother   . Cancer Brother        prostate  . Cancer Father        malignant brain tumor  . Mental illness Paternal Grandfather   . Heart disease Other        Sibling  . Diabetes Other        Sibling     Social History Social History   Tobacco Use  . Smoking status: Never Smoker  . Smokeless tobacco: Never Used  Substance Use Topics  . Alcohol use: No  . Drug use: No     Allergies   Cephalexin; Clindamycin; Oxycodone-acetaminophen; Rocephin [ceftriaxone sodium in dextrose]; Sulfonamide derivatives; and Dilaudid [hydromorphone hcl]   Review of Systems Review of Systems  Constitutional:  Per HPI, otherwise negative    HENT:       Per HPI, otherwise negative  Respiratory:       Per HPI, otherwise negative  Cardiovascular:       Per HPI, otherwise negative  Gastrointestinal: Negative for vomiting.  Endocrine:       Negative aside from HPI  Genitourinary:       Neg aside from HPI   Musculoskeletal:       Per HPI, otherwise negative  Skin: Negative.   Allergic/Immunologic: Positive for immunocompromised state.  Neurological: Negative for syncope.     Physical Exam Updated Vital Signs BP 131/83 (BP Location: Right Arm)   Pulse 84   Temp 98.4 F (36.9 C) (Oral)   Resp 18   SpO2 97%   Physical Exam Vitals signs and nursing note reviewed.  Constitutional:      General: She is not in acute distress.    Appearance: She is well-developed.  HENT:     Head: Normocephalic and atraumatic.      Mouth/Throat:     Mouth: Mucous membranes are moist.     Palate: No mass and lesions.     Pharynx: No pharyngeal swelling, oropharyngeal exudate, posterior oropharyngeal erythema or uvula swelling.  Eyes:     Conjunctiva/sclera: Conjunctivae normal.  Cardiovascular:     Rate and Rhythm: Normal rate and regular rhythm.  Pulmonary:     Effort: Pulmonary effort is normal.     Breath sounds: No decreased breath sounds.  Abdominal:     General: There is no distension.  Lymphadenopathy:     Cervical: No cervical adenopathy.  Skin:    General: Skin is warm and dry.  Neurological:     Mental Status: She is alert and oriented to person, place, and time.     Cranial Nerves: No cranial nerve deficit.      ED Treatments / Results  Labs (all labs ordered are listed, but only abnormal results are displayed) Labs Reviewed  COMPREHENSIVE METABOLIC PANEL - Abnormal; Notable for the following components:      Result Value   Glucose, Bld 167 (*)    Total Protein 5.7 (*)    Albumin 2.8 (*)    AST 70 (*)    Alkaline Phosphatase 131 (*)    All other components within normal limits  CBC WITH  DIFFERENTIAL/PLATELET - Abnormal; Notable for the following components:   Platelets 141 (*)    All other components within normal limits  BRAIN NATRIURETIC PEPTIDE - Abnormal; Notable for the following components:   B Natriuretic Peptide 257.4 (*)    All other components within normal limits    EKG Sinus rhythm, rate 89, nonspecific interventricular conduction delay, wander, artifact, T wave abnormalities, abnormal EKG  Radiology Dg Chest 2 View  Result Date: 03/27/2018 CLINICAL DATA:  Cough, congestion, and shortness of breath. History of metastatic breast cancer. EXAM: CHEST - 2 VIEW COMPARISON:  Chest x-ray dated February 15, 2018. FINDINGS: Unchanged right chest wall port catheter. The heart size and mediastinal contours are within normal limits. Both lungs are clear. The visualized skeletal structures are unremarkable. IMPRESSION: No active cardiopulmonary disease. Electronically Signed   By: Titus Dubin M.D.   On: 03/27/2018 10:27    Procedures Procedures (including critical care time)  Medications Ordered in ED Medications - No data to display   Initial Impression / Assessment and Plan / ED Course  I have reviewed the triage vital signs and the nursing notes.  Pertinent labs & imaging results that were available during my care of the patient were reviewed by me and considered in my medical decision making (see chart for details).        11:34 AM Patient in no distress, awake, alert, resting. Labs, x-ray reviewed with her and her husband. No evidence for systemic disease, no leukopenia concerning for substantial immunocompromised state. Given the patient's history of cancer, hypertension, medication choices for her sinus congestion are somewhat limited, but we discussed options, and she will initiate therapy as an outpatient, follow-up with her primary care team. Absent fever, leukocytosis, purulent discharge, lower suspicion for acute bacterial sinusitis with  complication.  Final Clinical Impressions(s) / ED Diagnoses  Sinus congestion   Carmin Muskrat, MD 03/27/18 1135    Carmin Muskrat, MD 04/05/18 2021

## 2018-03-27 NOTE — Discharge Instructions (Signed)
As discussed, your evaluation today has been largely reassuring.  But, it is important that you monitor your condition carefully, and do not hesitate to return to the ED if you develop new, or concerning changes in your condition. ? ?Otherwise, please follow-up with your physician for appropriate ongoing care. ? ?

## 2018-03-28 ENCOUNTER — Other Ambulatory Visit: Payer: Self-pay

## 2018-03-28 ENCOUNTER — Encounter: Payer: Self-pay | Admitting: Hematology and Oncology

## 2018-03-28 ENCOUNTER — Inpatient Hospital Stay (HOSPITAL_COMMUNITY)
Admission: EM | Admit: 2018-03-28 | Discharge: 2018-03-30 | DRG: 202 | Disposition: A | Discharge status: Home Health | Payer: Medicare Other | Source: Ambulatory Visit | Attending: Internal Medicine | Admitting: Internal Medicine

## 2018-03-28 ENCOUNTER — Ambulatory Visit: Payer: Self-pay

## 2018-03-28 ENCOUNTER — Emergency Department (HOSPITAL_COMMUNITY): Payer: Medicare Other

## 2018-03-28 DIAGNOSIS — J209 Acute bronchitis, unspecified: Secondary | ICD-10-CM | POA: Diagnosis present

## 2018-03-28 DIAGNOSIS — K219 Gastro-esophageal reflux disease without esophagitis: Secondary | ICD-10-CM | POA: Diagnosis present

## 2018-03-28 DIAGNOSIS — I5042 Chronic combined systolic (congestive) and diastolic (congestive) heart failure: Secondary | ICD-10-CM

## 2018-03-28 DIAGNOSIS — E440 Moderate protein-calorie malnutrition: Secondary | ICD-10-CM | POA: Diagnosis present

## 2018-03-28 DIAGNOSIS — R0902 Hypoxemia: Secondary | ICD-10-CM | POA: Diagnosis present

## 2018-03-28 DIAGNOSIS — T451X5A Adverse effect of antineoplastic and immunosuppressive drugs, initial encounter: Secondary | ICD-10-CM | POA: Diagnosis present

## 2018-03-28 DIAGNOSIS — I5023 Acute on chronic systolic (congestive) heart failure: Secondary | ICD-10-CM

## 2018-03-28 DIAGNOSIS — Z882 Allergy status to sulfonamides status: Secondary | ICD-10-CM

## 2018-03-28 DIAGNOSIS — Z833 Family history of diabetes mellitus: Secondary | ICD-10-CM

## 2018-03-28 DIAGNOSIS — I5021 Acute systolic (congestive) heart failure: Secondary | ICD-10-CM | POA: Diagnosis present

## 2018-03-28 DIAGNOSIS — Z808 Family history of malignant neoplasm of other organs or systems: Secondary | ICD-10-CM

## 2018-03-28 DIAGNOSIS — M8580 Other specified disorders of bone density and structure, unspecified site: Secondary | ICD-10-CM | POA: Diagnosis present

## 2018-03-28 DIAGNOSIS — Z923 Personal history of irradiation: Secondary | ICD-10-CM

## 2018-03-28 DIAGNOSIS — I420 Dilated cardiomyopathy: Secondary | ICD-10-CM | POA: Diagnosis not present

## 2018-03-28 DIAGNOSIS — I427 Cardiomyopathy due to drug and external agent: Secondary | ICD-10-CM | POA: Diagnosis present

## 2018-03-28 DIAGNOSIS — E785 Hyperlipidemia, unspecified: Secondary | ICD-10-CM | POA: Diagnosis present

## 2018-03-28 DIAGNOSIS — R6889 Other general symptoms and signs: Secondary | ICD-10-CM

## 2018-03-28 DIAGNOSIS — Z7722 Contact with and (suspected) exposure to environmental tobacco smoke (acute) (chronic): Secondary | ICD-10-CM | POA: Diagnosis present

## 2018-03-28 DIAGNOSIS — Z9012 Acquired absence of left breast and nipple: Secondary | ICD-10-CM

## 2018-03-28 DIAGNOSIS — I472 Ventricular tachycardia: Secondary | ICD-10-CM | POA: Diagnosis present

## 2018-03-28 DIAGNOSIS — Z6826 Body mass index (BMI) 26.0-26.9, adult: Secondary | ICD-10-CM

## 2018-03-28 DIAGNOSIS — Z79811 Long term (current) use of aromatase inhibitors: Secondary | ICD-10-CM

## 2018-03-28 DIAGNOSIS — Z794 Long term (current) use of insulin: Secondary | ICD-10-CM

## 2018-03-28 DIAGNOSIS — C787 Secondary malignant neoplasm of liver and intrahepatic bile duct: Secondary | ICD-10-CM | POA: Diagnosis present

## 2018-03-28 DIAGNOSIS — C50312 Malignant neoplasm of lower-inner quadrant of left female breast: Secondary | ICD-10-CM

## 2018-03-28 DIAGNOSIS — Z885 Allergy status to narcotic agent status: Secondary | ICD-10-CM

## 2018-03-28 DIAGNOSIS — Z79899 Other long term (current) drug therapy: Secondary | ICD-10-CM

## 2018-03-28 DIAGNOSIS — M6281 Muscle weakness (generalized): Secondary | ICD-10-CM | POA: Diagnosis present

## 2018-03-28 DIAGNOSIS — I493 Ventricular premature depolarization: Secondary | ICD-10-CM | POA: Diagnosis present

## 2018-03-28 DIAGNOSIS — E876 Hypokalemia: Secondary | ICD-10-CM | POA: Diagnosis not present

## 2018-03-28 DIAGNOSIS — Z170 Estrogen receptor positive status [ER+]: Secondary | ICD-10-CM

## 2018-03-28 DIAGNOSIS — E119 Type 2 diabetes mellitus without complications: Secondary | ICD-10-CM

## 2018-03-28 DIAGNOSIS — Z8701 Personal history of pneumonia (recurrent): Secondary | ICD-10-CM

## 2018-03-28 DIAGNOSIS — J811 Chronic pulmonary edema: Secondary | ICD-10-CM | POA: Diagnosis present

## 2018-03-28 DIAGNOSIS — I10 Essential (primary) hypertension: Secondary | ICD-10-CM

## 2018-03-28 DIAGNOSIS — I5043 Acute on chronic combined systolic (congestive) and diastolic (congestive) heart failure: Secondary | ICD-10-CM | POA: Diagnosis present

## 2018-03-28 DIAGNOSIS — E7800 Pure hypercholesterolemia, unspecified: Secondary | ICD-10-CM | POA: Diagnosis present

## 2018-03-28 DIAGNOSIS — Z881 Allergy status to other antibiotic agents status: Secondary | ICD-10-CM

## 2018-03-28 DIAGNOSIS — I110 Hypertensive heart disease with heart failure: Secondary | ICD-10-CM | POA: Diagnosis present

## 2018-03-28 LAB — COMPREHENSIVE METABOLIC PANEL
ALT: 40 U/L (ref 0–44)
AST: 69 U/L — ABNORMAL HIGH (ref 15–41)
Albumin: 2.6 g/dL — ABNORMAL LOW (ref 3.5–5.0)
Alkaline Phosphatase: 105 U/L (ref 38–126)
Anion gap: 8 (ref 5–15)
BUN: 12 mg/dL (ref 8–23)
CO2: 25 mmol/L (ref 22–32)
Calcium: 9.5 mg/dL (ref 8.9–10.3)
Chloride: 104 mmol/L (ref 98–111)
Creatinine, Ser: 0.87 mg/dL (ref 0.44–1.00)
GFR calc Af Amer: >60 mL/min (ref >60)
GFR calc non Af Amer: >60 mL/min (ref >60)
Glucose, Bld: 148 mg/dL — ABNORMAL HIGH (ref 70–99)
POTASSIUM: 3.8 mmol/L (ref 3.5–5.1)
SODIUM: 137 mmol/L (ref 135–145)
Total Bilirubin: 1.1 mg/dL (ref 0.3–1.2)
Total Protein: 5.5 g/dL — ABNORMAL LOW (ref 6.5–8.1)

## 2018-03-28 LAB — I-STAT TROPONIN, ED: Troponin i, poc: 0.03 ng/mL (ref 0.00–0.08)

## 2018-03-28 LAB — CBC WITH DIFFERENTIAL/PLATELET
Abs Immature Granulocytes: 0.03 10*3/uL (ref 0.00–0.07)
Basophils Absolute: 0.1 10*3/uL (ref 0.0–0.1)
Basophils Relative: 1 %
EOS ABS: 0 10*3/uL (ref 0.0–0.5)
Eosinophils Relative: 1 %
HCT: 45.8 % (ref 36.0–46.0)
Hemoglobin: 14.4 g/dL (ref 12.0–15.0)
Immature Granulocytes: 0 %
Lymphocytes Relative: 19 %
Lymphs Abs: 1.5 10*3/uL (ref 0.7–4.0)
MCH: 30.1 pg (ref 26.0–34.0)
MCHC: 31.4 g/dL (ref 30.0–36.0)
MCV: 95.6 fL (ref 80.0–100.0)
Monocytes Absolute: 1.2 10*3/uL — ABNORMAL HIGH (ref 0.1–1.0)
Monocytes Relative: 15 %
Neutro Abs: 5.2 10*3/uL (ref 1.7–7.7)
Neutrophils Relative %: 64 %
PLATELETS: 169 10*3/uL (ref 150–400)
RBC: 4.79 MIL/uL (ref 3.87–5.11)
RDW: 15.1 % (ref 11.5–15.5)
WBC: 8 10*3/uL (ref 4.0–10.5)
nRBC: 0 % (ref 0.0–0.2)

## 2018-03-28 LAB — PROCALCITONIN: Procalcitonin: 0.44 ng/mL

## 2018-03-28 LAB — TROPONIN I: Troponin I: 0.05 ng/mL (ref <0.03)

## 2018-03-28 LAB — URINALYSIS, ROUTINE W REFLEX MICROSCOPIC
Bacteria, UA: NONE SEEN
Bilirubin Urine: NEGATIVE
Glucose, UA: NEGATIVE mg/dL
Hgb urine dipstick: NEGATIVE
Ketones, ur: NEGATIVE mg/dL
Nitrite: NEGATIVE
Protein, ur: NEGATIVE mg/dL
Specific Gravity, Urine: 1.013 (ref 1.005–1.030)
pH: 5 (ref 5.0–8.0)

## 2018-03-28 LAB — LACTIC ACID, PLASMA
LACTIC ACID, VENOUS: 1.8 mmol/L (ref 0.5–1.9)
Lactic Acid, Venous: 4.2 mmol/L (ref 0.5–1.9)

## 2018-03-28 LAB — PROTIME-INR
INR: 1.2
Prothrombin Time: 15.2 seconds (ref 11.4–15.2)

## 2018-03-28 LAB — INFLUENZA PANEL BY PCR (TYPE A & B)
Influenza A By PCR: NEGATIVE
Influenza B By PCR: NEGATIVE

## 2018-03-28 LAB — BRAIN NATRIURETIC PEPTIDE: B Natriuretic Peptide: 383.9 pg/mL — ABNORMAL HIGH (ref 0.0–100.0)

## 2018-03-28 LAB — APTT: aPTT: 32 seconds (ref 24–36)

## 2018-03-28 LAB — LIPASE, BLOOD: Lipase: 33 U/L (ref 11–51)

## 2018-03-28 MED ORDER — ONDANSETRON HCL 4 MG PO TABS: 4.0000 mg | ORAL_TABLET | Freq: Four times a day (QID) | ORAL | Status: DC | PRN

## 2018-03-28 MED ORDER — FUROSEMIDE 10 MG/ML IJ SOLN
40.0000 mg | Freq: Once | INTRAMUSCULAR | Status: AC
  Administered 2018-03-28: 40 mg via INTRAVENOUS
  Filled 2018-03-28: qty 4

## 2018-03-28 MED ORDER — MOMETASONE FURO-FORMOTEROL FUM 200-5 MCG/ACT IN AERO
1.0000 | INHALATION_SPRAY | Freq: Two times a day (BID) | RESPIRATORY_TRACT | Status: DC
  Administered 2018-03-29 – 2018-03-30 (×3): 1 via RESPIRATORY_TRACT
  Filled 2018-03-28: qty 8.8

## 2018-03-28 MED ORDER — LETROZOLE 2.5 MG PO TABS
2.5000 mg | ORAL_TABLET | Freq: Every day | ORAL | Status: DC
  Administered 2018-03-29 – 2018-03-30 (×2): 2.5 mg via ORAL
  Filled 2018-03-28 (×2): qty 1

## 2018-03-28 MED ORDER — INSULIN ASPART 100 UNIT/ML ~~LOC~~ SOLN
0.0000 [IU] | Freq: Every day | SUBCUTANEOUS | Status: DC
  Administered 2018-03-29: 2 [IU] via SUBCUTANEOUS

## 2018-03-28 MED ORDER — PREDNISONE 20 MG PO TABS
40.0000 mg | ORAL_TABLET | Freq: Every day | ORAL | Status: DC
  Administered 2018-03-29 – 2018-03-30 (×2): 40 mg via ORAL
  Filled 2018-03-28 (×2): qty 2

## 2018-03-28 MED ORDER — SODIUM CHLORIDE 0.9 % IV BOLUS: 500.0000 mL | Freq: Once | INTRAVENOUS | Status: DC

## 2018-03-28 MED ORDER — AMIODARONE HCL 200 MG PO TABS
200.0000 mg | ORAL_TABLET | Freq: Every day | ORAL | Status: DC
  Administered 2018-03-29 – 2018-03-30 (×2): 200 mg via ORAL
  Filled 2018-03-28 (×2): qty 1

## 2018-03-28 MED ORDER — ONDANSETRON HCL 4 MG/2ML IJ SOLN: 4.0000 mg | Freq: Four times a day (QID) | INTRAMUSCULAR | Status: DC | PRN

## 2018-03-28 MED ORDER — ALBUTEROL SULFATE (2.5 MG/3ML) 0.083% IN NEBU
5.0000 mg | INHALATION_SOLUTION | RESPIRATORY_TRACT | Status: DC | PRN
  Administered 2018-03-28: 5 mg via RESPIRATORY_TRACT
  Administered 2018-03-29: 2.5 mg via RESPIRATORY_TRACT
  Filled 2018-03-28 (×2): qty 6

## 2018-03-28 MED ORDER — IOPAMIDOL (ISOVUE-370) INJECTION 76%
100.0000 mL | Freq: Once | INTRAVENOUS | Status: AC | PRN
  Administered 2018-03-28: 40 mL via INTRAVENOUS

## 2018-03-28 MED ORDER — LORAZEPAM 0.5 MG PO TABS: 0.5000 mg | ORAL_TABLET | Freq: Every evening | ORAL | Status: DC | PRN

## 2018-03-28 MED ORDER — SODIUM CHLORIDE 0.9 % IV SOLN
250.0000 mL | INTRAVENOUS | Status: DC | PRN
  Administered 2018-03-29: 250 mL via INTRAVENOUS

## 2018-03-28 MED ORDER — ACETAMINOPHEN 325 MG PO TABS
650.0000 mg | ORAL_TABLET | Freq: Four times a day (QID) | ORAL | Status: DC | PRN
  Administered 2018-03-29: 650 mg via ORAL
  Filled 2018-03-28: qty 2

## 2018-03-28 MED ORDER — FENTANYL CITRATE (PF) 100 MCG/2ML IJ SOLN
50.0000 ug | Freq: Once | INTRAMUSCULAR | Status: AC
  Administered 2018-03-28: 50 ug via INTRAVENOUS
  Filled 2018-03-28: qty 2

## 2018-03-28 MED ORDER — ALBUTEROL SULFATE (2.5 MG/3ML) 0.083% IN NEBU
5.0000 mg | INHALATION_SOLUTION | Freq: Once | RESPIRATORY_TRACT | Status: AC
  Administered 2018-03-28: 5 mg via RESPIRATORY_TRACT
  Filled 2018-03-28: qty 6

## 2018-03-28 MED ORDER — SERTRALINE HCL 50 MG PO TABS
50.0000 mg | ORAL_TABLET | Freq: Every day | ORAL | Status: DC
  Administered 2018-03-29 – 2018-03-30 (×2): 50 mg via ORAL
  Filled 2018-03-28 (×2): qty 1

## 2018-03-28 MED ORDER — GUAIFENESIN ER 600 MG PO TB12
600.0000 mg | ORAL_TABLET | Freq: Two times a day (BID) | ORAL | Status: DC
  Administered 2018-03-29 – 2018-03-30 (×4): 600 mg via ORAL
  Filled 2018-03-28 (×4): qty 1

## 2018-03-28 MED ORDER — DOXYCYCLINE HYCLATE 100 MG PO TABS
100.0000 mg | ORAL_TABLET | Freq: Two times a day (BID) | ORAL | Status: DC
  Administered 2018-03-29 – 2018-03-30 (×4): 100 mg via ORAL
  Filled 2018-03-28 (×4): qty 1

## 2018-03-28 MED ORDER — SODIUM CHLORIDE 0.9 % IV BOLUS
500.0000 mL | Freq: Once | INTRAVENOUS | Status: AC
  Administered 2018-03-29: 500 mL via INTRAVENOUS

## 2018-03-28 MED ORDER — ALPRAZOLAM 0.5 MG PO TABS: 0.5000 mg | ORAL_TABLET | Freq: Three times a day (TID) | ORAL | Status: DC | PRN

## 2018-03-28 MED ORDER — POLYETHYLENE GLYCOL 3350 17 G PO PACK: 17.0000 g | PACK | Freq: Every day | ORAL | Status: DC | PRN

## 2018-03-28 MED ORDER — IPRATROPIUM BROMIDE 0.02 % IN SOLN
0.5000 mg | Freq: Once | RESPIRATORY_TRACT | Status: AC
  Administered 2018-03-28: 0.5 mg via RESPIRATORY_TRACT
  Filled 2018-03-28: qty 2.5

## 2018-03-28 MED ORDER — INSULIN ASPART 100 UNIT/ML ~~LOC~~ SOLN
0.0000 [IU] | Freq: Three times a day (TID) | SUBCUTANEOUS | Status: DC
  Administered 2018-03-29: 3 [IU] via SUBCUTANEOUS
  Administered 2018-03-29 – 2018-03-30 (×2): 1 [IU] via SUBCUTANEOUS

## 2018-03-28 MED ORDER — SODIUM CHLORIDE 0.9% FLUSH
3.0000 mL | Freq: Two times a day (BID) | INTRAVENOUS | Status: DC
  Administered 2018-03-29: 3 mL via INTRAVENOUS

## 2018-03-28 MED ORDER — BISACODYL 10 MG RE SUPP: 10.0000 mg | Freq: Every day | RECTAL | Status: DC | PRN

## 2018-03-28 MED ORDER — BISOPROLOL FUMARATE 5 MG PO TABS
5.0000 mg | ORAL_TABLET | Freq: Every day | ORAL | Status: DC
  Administered 2018-03-29 – 2018-03-30 (×2): 5 mg via ORAL
  Filled 2018-03-28 (×2): qty 1

## 2018-03-28 MED ORDER — SENNA 8.6 MG PO TABS
1.0000 | ORAL_TABLET | Freq: Two times a day (BID) | ORAL | Status: DC
  Administered 2018-03-29 – 2018-03-30 (×3): 8.6 mg via ORAL
  Filled 2018-03-28 (×4): qty 1

## 2018-03-28 MED ORDER — IOPAMIDOL (ISOVUE-370) INJECTION 76%
INTRAVENOUS | Status: AC
  Filled 2018-03-28: qty 100

## 2018-03-28 MED ORDER — SODIUM CHLORIDE 0.9% FLUSH: 3.0000 mL | INTRAVENOUS | Status: DC | PRN

## 2018-03-28 MED ORDER — ENOXAPARIN SODIUM 40 MG/0.4ML ~~LOC~~ SOLN
40.0000 mg | SUBCUTANEOUS | Status: DC
  Administered 2018-03-29 – 2018-03-30 (×2): 40 mg via SUBCUTANEOUS
  Filled 2018-03-28 (×2): qty 0.4

## 2018-03-28 MED ORDER — IPRATROPIUM BROMIDE 0.02 % IN SOLN
0.5000 mg | Freq: Four times a day (QID) | RESPIRATORY_TRACT | Status: DC
  Administered 2018-03-29: 0.5 mg via RESPIRATORY_TRACT
  Filled 2018-03-28: qty 2.5

## 2018-03-28 MED ORDER — ACYCLOVIR 400 MG PO TABS
400.0000 mg | ORAL_TABLET | Freq: Three times a day (TID) | ORAL | Status: DC
  Administered 2018-03-29 – 2018-03-30 (×5): 400 mg via ORAL
  Filled 2018-03-28 (×5): qty 1

## 2018-03-28 MED ORDER — ACETAMINOPHEN 650 MG RE SUPP: 650.0000 mg | Freq: Four times a day (QID) | RECTAL | Status: DC | PRN

## 2018-03-28 NOTE — ED Triage Notes (Signed)
Per [facility],  Pt sent over from PCP. Pt reports productive cough with yellow sputum x 1 day. Pt reports feeling intermittent chest pain under L breast to epigastric area. Pt also reports SHOB. EMS reports rhonchi to bases. Pt denies N/V. Pt has hx of cancer with port to R chest

## 2018-03-28 NOTE — ED Notes (Signed)
RN informed it was to send visitor back

## 2018-03-28 NOTE — ED Notes (Signed)
Admitting at bedside 

## 2018-03-28 NOTE — Progress Notes (Signed)
Pt. Arrived from ED via stretcher alert and stable. Pt. Oriented to room and placed on telemetry. CCMD notified. Call light placed within reach. RN informed of Critical Lactic Acid of 4.2 by lab. On call for [facility] paged to make aware. RN will continue to monitor pt. For changes in condition. Negan Grudzien, Katherine Roan

## 2018-03-28 NOTE — ED Provider Notes (Signed)
[facility] EMERGENCY DEPARTMENT Provider Note   CSN: [id number] Arrival date & time: 03/28/18  1524    History   Chief Complaint Chief Complaint  Patient presents with  . Chest Pain  . Cough    HPI [name] is a 78 y.o. 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  Patient states that she was seen yesterday for shortness of breath.  She had unremarkable chest x-ray as well as labs and a BNP of 250.  Patient states that she had persistent chest pain and shortness of breath.  She also developed a nonproductive cough and epigastric pain.  She went to see her primary care doctor was sent here for further evaluation.  He was noted to have a low-grade temp 99 in the office.  She states that she has some chills and her daughter was diagnosed with the flu recently.     The history is provided by the patient.    Past Medical History:  Diagnosis Date  . ALLERGIC RHINITIS 12/13/2007  . ANXIETY 06/04/2008  . Blood transfusion 1964   post childbirth  . 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 Chronic combined systolic (congestive) and diastolic (congestive) heart failure ([place])   . DEGENERATIVE JOINT DISEASE 12/17/2006  . DEPRESSION 08/10/2007  . Dilated cardiomyopathy ([place]) 05/29/2015   a. Dx 05/2015 - EF 35-40% - felt secondary to XRT and chemo for breast CA. Normal coronaries by cath.  . DM type 2 (diabetes mellitus, type 2) ([place])   . Dyspnea    sometimes when walking , sometimes when sitting  . Essential hypertension   . GERD 12/17/2006  . History of blood transfusion    after child birth  . History of chemotherapy   . History of hiatal hernia   . History of radiation therapy 10/07/11-11/24/11   left breast,5040 cGy/28 sessions,l chest wall boost=1000cGy/5 sesions  . 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 Hyperlipidemia   .  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 OBESITY 06/04/2008  . 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 Pneumonia   . Premature atrial contractions   . Sinus bradycardia    a. 2018 event monitor which showed NSR with average HR 71, frequent PACs, occasional PVCs, and sinus bradycardia as low as 50.    Patient Active Problem List   Diagnosis Date Noted  . Metastatic breast cancer ([place]) 12/03/2017  . Neutropenic fever ([place]) 12/03/2017  . Port-A-Cath in place 12/01/2017  . PVC's (premature ventricular contractions) 05/28/2016  . NICM (nonischemic cardiomyopathy) ([place]) 05/28/2016  . Premature atrial contractions 04/21/2016  . Chronic combined systolic and diastolic CHF (congestive heart failure) ([place]) 07/17/2015  . Dilated cardiomyopathy ([place]) 05/29/2015  . Recurrent major depressive disorder ([place]) 02/28/2015  . Acute bronchitis 12/19/2014  . Acid reflux 06/20/2014  . Herpes 06/20/2014  . 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 H/O malignant neoplasm of breast 03/15/2013  . HLD (hyperlipidemia) 03/15/2013  . Long term current use of insulin ([place]) 03/15/2013  . 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 Controlled type 2 diabetes mellitus without complication ([place]) 10/93/2355  . Mixed incontinence 03/15/2013  . Macular degeneration 03/02/2013  . LBP (low back pain) 02/23/2013  . Neutropenia with fever ([place]) 06/25/2011  . Open wound of left breast 05/22/2011  . Primary cancer of lower-inner quadrant of left  female breast ([place]) 02/20/2011  . DM type 2 (diabetes mellitus, type 2) ([place]) 08/10/2007  . DEPRESSION 08/10/2007  . HYPERCHOLESTEROLEMIA 12/17/2006  . Essential hypertension 12/17/2006  . GERD 12/17/2006    Past Surgical History:  Procedure Laterality Date  . bladder tack  1974  . BREAST EXCISIONAL BIOPSY  11/10/1988   left benign Dr Margot Chimes  . BREAST EXCISIONAL BIOPSY  03/04/1983   Right - two areas - Dr Margot Chimes  . BREAST EXCISIONAL BIOPSY  10/09/1980   right - Dr Margot Chimes    . BREAST EXCISIONAL BIOPSY  10/18/1979   left - Dr Margot Chimes  . BREAST EXCISIONAL BIOPSY  01/02/1994   right - Dr Margot Chimes  . BREAST EXCISIONAL BIOPSY  2017   right  . BREAST SURGERY    . CARDIAC CATHETERIZATION N/A 05/29/2015   Procedure: Left Heart Cath and Coronary Angiography;  Surgeon: Belva Crome, MD;  Location: [facility];  Service: Cardiovascular;  Laterality: N/A;  . CHOLECYSTECTOMY  02/23/1991   LC - Dr Margot Chimes  . COLONOSCOPY    . ENTEROCELE REPAIR  06/08   Dr. Cletis Media  . EVACUATION BREAST HEMATOMA  07/01/2011   Procedure: EVACUATION HEMATOMA BREAST;  Surgeon: Haywood Lasso, MD;  Location: [facility];  Service: General;  Laterality: Left;  Drainage of left mastectomy seroma  . EYE SURGERY Right    catartact  . HIP SURGERY Right 08/27/2010   DR. Maureen Ralphs-  "burcitis removal"  . LAMINECTOMY  1993   s/p-Dr. Rita Ohara  . MASTECTOMY Left 03/23/2011  . MODIFIED RADICAL MASTECTOMY W/ AXILLARY LYMPH NODE DISSECTION Left 03-30-11  . PORT-A-CATH REMOVAL  12/02/2011   Procedure: REMOVAL PORT-A-CATH;  Surgeon: Haywood Lasso, MD;  Location: [facility];  Service: General;  Laterality: N/A;  . PORTACATH PLACEMENT  04/13/2011   Procedure: INSERTION PORT-A-CATH;  Surgeon: Haywood Lasso, MD;  Location: [facility];  Service: General;  Laterality: N/A;  porta cath placement,removal of J-P drain  . PORTACATH PLACEMENT Right 11/25/2017   Procedure: INSERTION PORT-A-CATH WITH [place];  Surgeon: Rolm Bookbinder, MD;  Location: [facility];  Service: General;  Laterality: Right;  . RADIOACTIVE SEED GUIDED EXCISIONAL BREAST BIOPSY Right 08/09/2015   Procedure: RIGHT RADIOACTIVE SEED GUIDED EXCISIONAL BREAST BIOPSY, RIGHT NIPPLE LESION EXCISION;  Surgeon: Rolm Bookbinder, MD;  Location: [facility];  Service: General;  Laterality: Right;  . ROTATOR CUFF REPAIR  8/03   right, Dr. Tonita Cong  . ROTATOR CUFF REPAIR  10/26/2008   left, Dr. Rush Farmer  .  TONSILLECTOMY    . VAGINAL HYSTERECTOMY  1974   partial      OB History    Gravida  5   Para  3   Term      Preterm      AB      Living  3     SAB      TAB      Ectopic      Multiple      Live Births               Home Medications    Prior to Admission medications   Medication Sig Start Date End Date Taking? Authorizing Provider  acyclovir (ZOVIRAX) 200 MG capsule TAKE 2 CAPSULES BY MOUTH 3 TIMES DAILY Patient taking differently: Take 400 mg by mouth 3 (three) times daily.  10/23/13  Yes Noralee Space, MD  ALPRAZolam Duanne Moron) 0.5 MG tablet TAKE ONE-HALF TO ONE TABLET BY  MOUTH THREE TIMES DAILY AS NEEDED FOR  NERVES Patient taking differently: Take 0.5 mg by mouth 3 (three) times daily as needed for anxiety.  02/23/13  Yes Noralee Space, MD  amiodarone (PACERONE) 200 MG tablet Take 1 tablet (200 mg total) by mouth daily. 01/04/18  Yes Bensimhon, Shaune Pascal, MD  carvedilol (COREG) 6.25 MG tablet TAKE 1 TABLET TWICE DAILY WITH MEALS Patient taking differently: Take 6.25 mg by mouth 2 (two) times daily with a meal.  12/27/17  Yes Turner, Eber Hong, MD  Chlorpheniramine-DM (CORICIDIN COUGH/COLD) 4-30 MG TABS Take as directed on the packaging 03/27/18  Yes Carmin Muskrat, MD  cholecalciferol (VITAMIN D) 1000 UNITS tablet Take 1 tablet (1,000 Units total) by mouth daily. 02/23/13  Yes Noralee Space, MD  cyanocobalamin 500 MCG tablet Take 1 tablet (500 mcg total) by mouth daily. 02/23/13  Yes Noralee Space, MD  furosemide (LASIX) 20 MG tablet Take 1 tablet (20 mg total) by mouth as needed (for swelling). 03/18/18 06/16/18 Yes Bensimhon, Shaune Pascal, MD  ketoconazole (NIZORAL) 2 % cream Apply 1 application topically daily. 03/15/18  Yes Nicholas Lose, MD  letrozole [facility]) 2.5 MG tablet Take 2.5 mg by mouth daily.  12/14/17  Yes [provider]  liraglutide (VICTOZA) 18 MG/3ML SOPN Inject 0.3 mLs (1.8 mg total) into the skin daily. 12/16/17  Yes Nicholas Lose, MD  LORazepam  (ATIVAN) 0.5 MG tablet Take 1 tablet (0.5 mg total) by mouth at bedtime as needed for sleep. 03/15/18  Yes Nicholas Lose, MD  losartan (COZAAR) 25 MG tablet Take 1 tablet (25 mg total) by mouth at bedtime. 03/18/18 06/16/18 Yes Bensimhon, Shaune Pascal, MD  magnesium oxide (MAG-OX) 400 MG tablet Take 1 tablet (400 mg total) by mouth daily. 03/15/18  Yes Nicholas Lose, MD  Multiple Vitamins-Minerals (ONE-A-DAY WOMENS 50+ ADVANTAGE) TABS Take 1 tablet by mouth daily. 02/23/13  Yes Noralee Space, MD  omeprazole (PRILOSEC) 40 MG capsule Take 40 mg by mouth daily.   Yes [provider]  potassium chloride SA (K-DUR,KLOR-CON) 20 MEQ tablet Take 1 tablet (20 mEq total) by mouth as needed (take with lasix as needed). 03/18/18  Yes Bensimhon, Shaune Pascal, MD  sertraline (ZOLOFT) 50 MG tablet Take 1 tablet (50 mg total) by mouth daily. 02/23/13  Yes Noralee Space, MD  spironolactone (ALDACTONE) 25 MG tablet Take 0.5 tablets (12.5 mg total) by mouth every Monday, Wednesday, and Friday. 12/06/17  Yes Florencia Reasons, MD  triamcinolone (NASACORT) 55 MCG/ACT AERO nasal inhaler Place 2 sprays into the nose daily. Use daily for five days 03/27/18  Yes Carmin Muskrat, MD  amoxicillin (AMOXIL) 500 MG capsule Take four capsules one hour before dental appointment. 01/19/18   Lenn Cal, DDS  Insulin Pen Needle (RELION PEN NEEDLE 31G/8MM) 31G X 8 MM MISC 1 Device by Does not apply route 4 (four) times daily. 10/05/12   Renato Shin, MD  lidocaine-prilocaine (EMLA) cream Apply to affected area once Patient not taking: Reported on 03/27/2018 11/19/17   Nicholas Lose, MD    Family History Family History  Problem Relation Age of Onset  . COPD Mother   . Arthritis Mother   . Emphysema Mother   . Diabetes Brother   . Cancer Brother        prostate  . Cancer Father        malignant brain tumor  . Mental illness Paternal Grandfather   . Heart disease Other  Sibling  . Diabetes Other        Sibling     Social  History Social History   Tobacco Use  . Smoking status: Never Smoker  . Smokeless tobacco: Never Used  Substance Use Topics  . Alcohol use: No  . Drug use: No     Allergies   Cephalexin; Clindamycin; Oxycodone-acetaminophen; Rocephin [ceftriaxone sodium in dextrose]; Sulfonamide derivatives; and Dilaudid [hydromorphone hcl]   Review of Systems Review of Systems  Constitutional: Positive for chills and fever.  Respiratory: Positive for cough and shortness of breath.   All other systems reviewed and are negative.    Physical Exam Updated Vital Signs BP (!) 118/58   Pulse 88   Temp (!) 100.4 F (38 C) (Rectal)   Resp (!) 23   SpO2 97%   Physical Exam Vitals signs and nursing note reviewed.  Constitutional:      Comments: Uncomfortable, chronically ill   HENT:     Head: Normocephalic.  Eyes:     Extraocular Movements: Extraocular movements intact.     Pupils: Pupils are equal, round, and reactive to light.  Neck:     Musculoskeletal: Normal range of motion and neck supple.  Cardiovascular:     Rate and Rhythm: Normal rate and regular rhythm.     Heart sounds: Normal heart sounds.  Pulmonary:     Comments: Slightly tachypneic, crackles L base.  Abdominal:     General: Bowel sounds are normal.     Palpations: Abdomen is soft.  Musculoskeletal: Normal range of motion.     Right lower leg: No edema.     Left lower leg: No edema.  Skin:    General: Skin is warm.     Capillary Refill: Capillary refill takes less than 2 seconds.  Neurological:     General: No focal deficit present.     Mental Status: She is oriented to person, place, and time.  Psychiatric:        Mood and Affect: Mood normal.        Behavior: Behavior normal.      ED Treatments / Results  Labs (all labs ordered are listed, but only abnormal results are displayed) Labs Reviewed  CBC WITH DIFFERENTIAL/PLATELET - Abnormal; Notable for the following components:      Result Value   Monocytes  Absolute 1.2 (*)    All other components within normal limits  COMPREHENSIVE METABOLIC PANEL - Abnormal; Notable for the following components:   Glucose, Bld 148 (*)    Total Protein 5.5 (*)    Albumin 2.6 (*)    AST 69 (*)    All other components within normal limits  BRAIN NATRIURETIC PEPTIDE - Abnormal; Notable for the following components:   B Natriuretic Peptide 383.9 (*)    All other components within normal limits  CULTURE, BLOOD (ROUTINE X 2)  CULTURE, BLOOD (ROUTINE X 2)  RESPIRATORY PANEL BY PCR  URINE CULTURE  LACTIC ACID, PLASMA  LIPASE, BLOOD  INFLUENZA PANEL BY PCR (TYPE A & B)  URINALYSIS, ROUTINE W REFLEX MICROSCOPIC  I-STAT TROPONIN, ED    EKG None  Radiology Dg Chest 2 View  Result Date: 03/28/2018 CLINICAL DATA:  Shortness of breath since Saturday, worse today. EXAM: CHEST - 2 VIEW COMPARISON:  Chest x-rays dated 03/27/2018, 02/15/2018 and 12/03/2017. FINDINGS: Mild cardiomegaly. RIGHT chest wall Port-A-Cath in place with tip at the level of the mid SVC. Mild interstitial edema. Lungs otherwise clear. No confluent opacity to suggest  a developing pneumonia. No pleural effusion or pneumothorax seen. Osseous structures about the chest are unremarkable. IMPRESSION: Mild cardiomegaly. Mild interstitial edema suggesting mild CHF/volume overload. No evidence of pneumonia or overt alveolar pulmonary edema. Electronically Signed   By: Franki Cabot M.D.   On: 03/28/2018 16:57   Dg Chest 2 View  Result Date: 03/27/2018 CLINICAL DATA:  Cough, congestion, and shortness of breath. History of metastatic breast cancer. EXAM: CHEST - 2 VIEW COMPARISON:  Chest x-ray dated February 15, 2018. FINDINGS: Unchanged right chest wall port catheter. The heart size and mediastinal contours are within normal limits. Both lungs are clear. The visualized skeletal structures are unremarkable. IMPRESSION: No active cardiopulmonary disease. Electronically Signed   By: Titus Dubin M.D.   On:  03/27/2018 10:27   Ct Angio Chest Pe W And/or Wo Contrast  Result Date: 03/28/2018 CLINICAL DATA:  Productive cough EXAM: CT ANGIOGRAPHY CHEST WITH CONTRAST TECHNIQUE: Multidetector CT imaging of the chest was performed using the standard protocol during bolus administration of intravenous contrast. Multiplanar CT image reconstructions and MIPs were obtained to evaluate the vascular anatomy. CONTRAST:  70mL ISOVUE-370 IOPAMIDOL (ISOVUE-370) INJECTION 76% COMPARISON:  03/11/2018 FINDINGS: Cardiovascular: Right Port-A-Cath remains in place, unchanged. No filling defects in the pulmonary arteries to suggest pulmonary emboli. Heart is upper limits normal in size. Aorta is normal caliber. Mediastinum/Nodes: No mediastinal, hilar, or axillary adenopathy. Lungs/Pleura: Small left pleural effusion. No confluent airspace opacities or effusions. Upper Abdomen: Low-density areas throughout the liver compatible with diffuse hepatic metastases as better seen on recent study. Musculoskeletal: Changes of left mastectomy and lymph node dissection. Stable T9 lucent lesion dating back to 2013 compatible with benign lesion, likely hemangioma. No acute bony abnormality. Review of the MIP images confirms the above findings. IMPRESSION: No evidence of pulmonary embolus. Borderline heart size. Small left pleural effusion. No acute cardiopulmonary disease. Diffuse a patent metastases again noted, stable since recent study. Electronically Signed   By: Rolm Baptise M.D.   On: 03/28/2018 18:07    Procedures Procedures (including critical care time)   CRITICAL CARE Performed by: Wandra Arthurs   Total critical care time: 30  minutes  Critical care time was exclusive of separately billable procedures and treating other patients.  Critical care was necessary to treat or prevent imminent or life-threatening deterioration.  Critical care was time spent personally by me on the following activities: development of treatment plan  with patient and/or surrogate as well as nursing, discussions with consultants, evaluation of patient's response to treatment, examination of patient, obtaining history from patient or surrogate, ordering and performing treatments and interventions, ordering and review of laboratory studies, ordering and review of radiographic studies, pulse oximetry and re-evaluation of patient's condition.   Medications Ordered in ED Medications  iopamidol (ISOVUE-370) 76 % injection (has no administration in time range)  fentaNYL (SUBLIMAZE) injection 50 mcg (50 mcg Intravenous Given 03/28/18 1613)  iopamidol (ISOVUE-370) 76 % injection 100 mL (40 mLs Intravenous Contrast Given 03/28/18 1742)  albuterol (PROVENTIL) (2.5 MG/3ML) 0.083% nebulizer solution 5 mg (5 mg Nebulization Given 03/28/18 1923)  ipratropium (ATROVENT) nebulizer solution 0.5 mg (0.5 mg Nebulization Given 03/28/18 1923)  furosemide (LASIX) injection 40 mg (40 mg Intravenous Given 03/28/18 1929)     Initial Impression / Assessment and Plan / ED Course  I have reviewed the triage vital signs and the nursing notes.  Pertinent labs & imaging results that were available during my care of the patient were reviewed by me and considered  in my medical decision making (see chart for details).       DEMONI PARMAR is a 78 y.o. female here with SOB, cough. Low grade temp 100.2 F, hypotensive in the ED. I am concerned for possible pneumonia vs worsening cardiomyopathy vs flu vs PE. Will get labs, BNP, lactate, cultures, CXR, CTA chest. Will need admission.   7:30 pm Temp 100.4 F rectally. BNP increased to 380 from 200s. CXR showed possible interstitial edema. CTA showed no PE or pneumonia. WBC nl, lactate nl. I suspect flu like illness with worsening pulmonary edema. She has EF of 30% from the chemo so will diurese with lasix. Her O2 is 91% on RA. Placed on 2 L [place]. Will admit for hypoxia from pulmonary edema, flu like illness.   8:07 PM Influenza  negative. Will send respiratory viral panel. Hospitalist to admit.    Final Clinical Impressions(s) / ED Diagnoses   Final diagnoses:  Flu-like symptoms  Acute on chronic systolic congestive heart failure [facility])    ED Discharge Orders    None       Drenda Freeze, MD 03/28/18 2007

## 2018-03-28 NOTE — H&P (Addendum)
[name] YKD:[id number] DOB: 02-22-1940 DOA: 03/28/2018     PCP: Vicenta Aly, FNP   Outpatient Specialists:  CARDS:   Dr.Bensihmone    Oncology  Dr. Lindi Adie      Patient arrived to ER on 03/28/18 at 1524  Patient coming from: home Lives With family    Chief Complaint:  Chief Complaint  Patient presents with  . Chest Pain  . Cough    HPI: [name] is a 78 y.o. female with medical history significant of Breast cancer, on chemo resulting in cardiomyopathy EF 30%, DM2 HLD    Presented with   Shortness of breath and chest pain for several days gotten much worse today. Chest feels tight and hurting. No pain with deep breath now but was last night Reports dry cough. She have had URI symptoms for the past few days.  Seen yesterday for SOB and Chest pressure Was able to be discharged home Went to PCP today Was told to come back to ER  She have not had much to eat for the past few days and did not take her medications  She never smoked by her husband did she reports occasional wheezing and chest tightness when she gets a cold. Better with breathing treatment   Regarding pertinent Chronic problems: breast cancer no longer on chemo due to developing CHF   hx of CHF last echo was 03/18/2018  EF 20-25% cavity size was mildly dilated ventricular diastolic Doppler parameters are consistent with impaired relaxation Left ventricular diffuse  hypokinesis.  While in ER: Low grade temp 100.4 BP low 100 91% on RA new for her  Influenza neg  The following Work up has been ordered so far:  Orders Placed This Encounter  Procedures  . Blood culture (routine x 2)  . Respiratory Panel by PCR  . DG Chest 2 View  . CT Angio Chest PE W and/or Wo Contrast  . CBC with Differential/Platelet  . Comprehensive metabolic panel  . Brain natriuretic peptide  . Lipase, blood  . Influenza panel by PCR (type A & B)  . Check Rectal Temperature  . Consult for  [facility] Admission  . I-stat troponin, ED  . EKG 12-Lead     Following Medications were ordered in ER: Medications  iopamidol (ISOVUE-370) 76 % injection (has no administration in time range)  fentaNYL (SUBLIMAZE) injection 50 mcg (50 mcg Intravenous Given 03/28/18 1613)  iopamidol (ISOVUE-370) 76 % injection 100 mL (40 mLs Intravenous Contrast Given 03/28/18 1742)  albuterol (PROVENTIL) (2.5 MG/3ML) 0.083% nebulizer solution 5 mg (5 mg Nebulization Given 03/28/18 1923)  ipratropium (ATROVENT) nebulizer solution 0.5 mg (0.5 mg Nebulization Given 03/28/18 1923)  furosemide (LASIX) injection 40 mg (40 mg Intravenous Given 03/28/18 1929)    Significant initial  Findings: Abnormal Labs Reviewed  CBC WITH DIFFERENTIAL/PLATELET - Abnormal; Notable for the following components:      Result Value   Monocytes Absolute 1.2 (*)    All other components within normal limits  COMPREHENSIVE METABOLIC PANEL - Abnormal; Notable for the following components:   Glucose, Bld 148 (*)    Total Protein 5.5 (*)    Albumin 2.6 (*)    AST 69 (*)    All other components within normal limits  BRAIN NATRIURETIC PEPTIDE - Abnormal; Notable for the following components:   B Natriuretic Peptide 383.9 (*)    All other components within normal limits   Trop 0.03  Lactic Acid, Venous    Component  Value Date/Time   LATICACIDVEN 1.8 03/28/2018 1616    Na 137 K 3.8  Cr   stable,    Lab Results  Component Value Date   CREATININE 0.87 03/28/2018   CREATININE 0.79 03/27/2018   CREATININE 0.95 03/15/2018    WBC  8.0  HG/HCT stable,       Component Value Date/Time   HGB 14.4 03/28/2018 1616   HGB 14.3 03/15/2018 1442   HGB 14.4 04/29/2016 1438   HCT 45.8 03/28/2018 1616   HCT 43.2 04/29/2016 1438     Troponin (Point of Care Test) Recent Labs    03/28/18 1628  TROPIPOC 0.03    BNP (last 3 results) Recent Labs    03/27/18 0929 03/28/18 1616  BNP 257.4* 383.9*    ProBNP (last 3  results) No results for input(s): PROBNP in the last 8760 hours.  Lactic acid 1.8   UA  no evidence of UTI      CXR - edema CTA - no PE, no PNA    ECG:  Personally reviewed by me showing: HR : 89 Rhythm:  NSR,    no evidence of ischemic changes QTC 407      ED Triage Vitals  Enc Vitals Group     BP 03/28/18 1534 99/67     Pulse Rate 03/28/18 1534 93     Resp 03/28/18 1534 18     Temp 03/28/18 1534 100.2 F (37.9 C)     Temp Source 03/28/18 1534 Oral     SpO2 03/28/18 1529 97 %     Weight --      Height --      Head Circumference --      Peak Flow --      Pain Score 03/28/18 1655 Asleep     Pain Loc --      Pain Edu? --      Excl. in [place]? --   TMAX(24)@       Latest  Blood pressure (!) 118/58, pulse 88, temperature (!) 100.4 F (38 C), temperature source Rectal, resp. rate (!) 23, SpO2 97 %.   Hospitalist was called for admission for CHF versus bronchitis exacerbation   Review of Systems:    Pertinent positives include: Fevers, chills, fatigue,chest pain, shortness of breath at rest dyspnea on exertion, non-productive cough, Constitutional:  No weight loss, night sweats,  weight loss  HEENT:  No headaches, Difficulty swallowing,Tooth/dental problems,Sore throat,  No sneezing, itching, ear ache, nasal congestion, post nasal drip,  Cardio-vascular:  No  Orthopnea, PND, anasarca, dizziness, palpitations.no Bilateral lower extremity swelling  GI:  No heartburn, indigestion, abdominal pain, nausea, vomiting, diarrhea, change in bowel habits, loss of appetite, melena, blood in stool, hematemesis Resp:   No coughing up of blood.No change in color of mucus.No wheezing. Skin:  no rash or lesions. No jaundice GU:  no dysuria, change in color of urine, no urgency or frequency. No straining to urinate.  No flank pain.  Musculoskeletal:  No joint pain or no joint swelling. No decreased range of motion. No back pain.  Psych:  No change in mood or affect. No  depression or anxiety. No memory loss.  Neuro: no localizing neurological complaints, no tingling, no weakness, no double vision, no gait abnormality, no slurred speech, no confusion  All systems reviewed and apart from [place] all are negative  Past Medical History:   Past Medical History:  Diagnosis Date  . ALLERGIC RHINITIS 12/13/2007  . ANXIETY 06/04/2008  .  Blood transfusion 1964   post childbirth  . 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 Chronic combined systolic (congestive) and diastolic (congestive) heart failure ([place])   . DEGENERATIVE JOINT DISEASE 12/17/2006  . DEPRESSION 08/10/2007  . Dilated cardiomyopathy ([place]) 05/29/2015   a. Dx 05/2015 - EF 35-40% - felt secondary to XRT and chemo for breast CA. Normal coronaries by cath.  . DM type 2 (diabetes mellitus, type 2) ([place])   . Dyspnea    sometimes when walking , sometimes when sitting  . Essential hypertension   . GERD 12/17/2006  . History of blood transfusion    after child birth  . History of chemotherapy   . History of hiatal hernia   . History of radiation therapy 10/07/11-11/24/11   left breast,5040 cGy/28 sessions,l chest wall boost=1000cGy/5 sesions  . 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 Hyperlipidemia   . 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 OBESITY 06/04/2008  . 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 Pneumonia   . Premature atrial contractions   . Sinus bradycardia    a. 2018 event monitor which showed NSR with average HR 71, frequent PACs, occasional PVCs, and sinus bradycardia as low as 50.      Past Surgical History:  Procedure Laterality Date  . bladder tack  1974  . BREAST EXCISIONAL BIOPSY  11/10/1988   left benign Dr Margot Chimes  . BREAST EXCISIONAL BIOPSY  03/04/1983   Right - two areas - Dr Margot Chimes  . BREAST EXCISIONAL BIOPSY  10/09/1980   right - Dr Margot Chimes  . BREAST EXCISIONAL BIOPSY  10/18/1979   left - Dr Margot Chimes  . BREAST EXCISIONAL BIOPSY  01/02/1994   right - Dr  Margot Chimes  . BREAST EXCISIONAL BIOPSY  2017   right  . BREAST SURGERY    . CARDIAC CATHETERIZATION N/A 05/29/2015   Procedure: Left Heart Cath and Coronary Angiography;  Surgeon: Belva Crome, MD;  Location: [facility];  Service: Cardiovascular;  Laterality: N/A;  . CHOLECYSTECTOMY  02/23/1991   LC - Dr Margot Chimes  . COLONOSCOPY    . ENTEROCELE REPAIR  06/08   Dr. Cletis Media  . EVACUATION BREAST HEMATOMA  07/01/2011   Procedure: EVACUATION HEMATOMA BREAST;  Surgeon: Haywood Lasso, MD;  Location: [facility];  Service: General;  Laterality: Left;  Drainage of left mastectomy seroma  . EYE SURGERY Right    catartact  . HIP SURGERY Right 08/27/2010   DR. Maureen Ralphs-  "burcitis removal"  . LAMINECTOMY  1993   s/p-Dr. Rita Ohara  . MASTECTOMY Left 03/23/2011  . MODIFIED RADICAL MASTECTOMY W/ AXILLARY LYMPH NODE DISSECTION Left 03-30-11  . PORT-A-CATH REMOVAL  12/02/2011   Procedure: REMOVAL PORT-A-CATH;  Surgeon: Haywood Lasso, MD;  Location: [facility] [place];  Service: General;  Laterality: N/A;  . PORTACATH PLACEMENT  04/13/2011   Procedure: INSERTION PORT-A-CATH;  Surgeon: Haywood Lasso, MD;  Location: [place];  Service: General;  Laterality: N/A;  porta cath placement,removal of J-P drain  . PORTACATH PLACEMENT Right 11/25/2017   Procedure: INSERTION PORT-A-CATH WITH [place];  Surgeon: Rolm Bookbinder, MD;  Location: [place];  Service: General;  Laterality: Right;  . RADIOACTIVE SEED GUIDED EXCISIONAL BREAST BIOPSY Right 08/09/2015   Procedure: RIGHT RADIOACTIVE SEED GUIDED EXCISIONAL BREAST BIOPSY, RIGHT NIPPLE LESION EXCISION;  Surgeon: Rolm Bookbinder, MD;  Location: [place];  Service: General;  Laterality: Right;  .  ROTATOR CUFF REPAIR  8/03   right, Dr. Tonita Cong  . ROTATOR CUFF REPAIR  10/26/2008   left, Dr. Rush Farmer  . TONSILLECTOMY    . VAGINAL HYSTERECTOMY  1974   partial     Social History:  Ambulatory  independently      reports  that she has never smoked. She has never used smokeless tobacco. She reports that she does not drink alcohol or use drugs.     Family History:   Family History  Problem Relation Age of Onset  . COPD Mother   . Arthritis Mother   . Emphysema Mother   . Diabetes Brother   . Cancer Brother        prostate  . Cancer Father        malignant brain tumor  . Mental illness Paternal Grandfather   . Heart disease Other        Sibling  . Diabetes Other        Sibling     Allergies: Allergies  Allergen Reactions  . Cephalexin Hives    Patient indicates that she has used amoxicillin without hives or other allergic reaction.  . Clindamycin Hives  . Oxycodone-Acetaminophen Hives and Itching  . Rocephin [Ceftriaxone Sodium In Dextrose] Hives  . Sulfonamide Derivatives Hives  . Dilaudid [Hydromorphone Hcl] Itching     Prior to Admission medications   Medication Sig Start Date End Date Taking? Authorizing Provider  acyclovir (ZOVIRAX) 200 MG capsule TAKE 2 CAPSULES BY MOUTH 3 TIMES DAILY Patient taking differently: Take 400 mg by mouth 3 (three) times daily.  10/23/13  Yes Noralee Space, MD  ALPRAZolam Duanne Moron) 0.5 MG tablet TAKE ONE-HALF TO ONE TABLET BY MOUTH THREE TIMES DAILY AS NEEDED FOR  NERVES Patient taking differently: Take 0.5 mg by mouth 3 (three) times daily as needed for anxiety.  02/23/13  Yes Noralee Space, MD  amiodarone (PACERONE) 200 MG tablet Take 1 tablet (200 mg total) by mouth daily. 01/04/18  Yes Bensimhon, Shaune Pascal, MD  carvedilol (COREG) 6.25 MG tablet TAKE 1 TABLET TWICE DAILY WITH MEALS Patient taking differently: Take 6.25 mg by mouth 2 (two) times daily with a meal.  12/27/17  Yes Turner, Eber Hong, MD  Chlorpheniramine-DM (CORICIDIN COUGH/COLD) 4-30 MG TABS Take as directed on the packaging 03/27/18  Yes Carmin Muskrat, MD  cholecalciferol (VITAMIN D) 1000 UNITS tablet Take 1 tablet (1,000 Units total) by mouth daily. 02/23/13  Yes Noralee Space, MD    cyanocobalamin 500 MCG tablet Take 1 tablet (500 mcg total) by mouth daily. 02/23/13  Yes Noralee Space, MD  furosemide (LASIX) 20 MG tablet Take 1 tablet (20 mg total) by mouth as needed (for swelling). 03/18/18 06/16/18 Yes Bensimhon, Shaune Pascal, MD  ketoconazole (NIZORAL) 2 % cream Apply 1 application topically daily. 03/15/18  Yes Nicholas Lose, MD  letrozole [facility]) 2.5 MG tablet Take 2.5 mg by mouth daily.  12/14/17  Yes [provider]  liraglutide (VICTOZA) 18 MG/3ML SOPN Inject 0.3 mLs (1.8 mg total) into the skin daily. 12/16/17  Yes Nicholas Lose, MD  LORazepam (ATIVAN) 0.5 MG tablet Take 1 tablet (0.5 mg total) by mouth at bedtime as needed for sleep. 03/15/18  Yes Nicholas Lose, MD  losartan (COZAAR) 25 MG tablet Take 1 tablet (25 mg total) by mouth at bedtime. 03/18/18 06/16/18 Yes Bensimhon, Shaune Pascal, MD  magnesium oxide (MAG-OX) 400 MG tablet Take 1 tablet (400 mg total) by mouth daily. 03/15/18  Yes Gudena,  Loleta Dicker, MD  Multiple Vitamins-Minerals (ONE-A-DAY WOMENS 50+ ADVANTAGE) TABS Take 1 tablet by mouth daily. 02/23/13  Yes Noralee Space, MD  omeprazole (PRILOSEC) 40 MG capsule Take 40 mg by mouth daily.   Yes [provider]  potassium chloride SA (K-DUR,KLOR-CON) 20 MEQ tablet Take 1 tablet (20 mEq total) by mouth as needed (take with lasix as needed). 03/18/18  Yes Bensimhon, Shaune Pascal, MD  sertraline (ZOLOFT) 50 MG tablet Take 1 tablet (50 mg total) by mouth daily. 02/23/13  Yes Noralee Space, MD  spironolactone (ALDACTONE) 25 MG tablet Take 0.5 tablets (12.5 mg total) by mouth every Monday, Wednesday, and Friday. 12/06/17  Yes Florencia Reasons, MD  triamcinolone (NASACORT) 55 MCG/ACT AERO nasal inhaler Place 2 sprays into the nose daily. Use daily for five days 03/27/18  Yes Carmin Muskrat, MD  amoxicillin (AMOXIL) 500 MG capsule Take four capsules one hour before dental appointment. 01/19/18   Lenn Cal, DDS  Insulin Pen Needle (RELION PEN NEEDLE 31G/8MM) 31G X 8 MM  MISC 1 Device by Does not apply route 4 (four) times daily. 10/05/12   Renato Shin, MD  lidocaine-prilocaine (EMLA) cream Apply to affected area once Patient not taking: Reported on 03/27/2018 11/19/17   Nicholas Lose, MD   Physical Exam: Blood pressure (!) 118/58, pulse 88, temperature (!) 100.4 F (38 C), temperature source Rectal, resp. rate (!) 23, SpO2 97 %. 1. General:  in No  Acute distress   Chronically ill   acutely ill -appearing 2. Psychological: Alert and   Oriented 3. Head/ENT:     Dry Mucous Membranes                          Head Non traumatic, neck supple                           Poor Dentition 4. SKIN:  decreased Skin turgor,  Skin clean Dry and intact no rash 5. Heart: Regular rate and rhythm no  Murmur, no Rub or gallop 6. Lungs: distant tight no wheezes some  crackles   7. Abdomen: Soft,  non-tender, Non distended  obese  bowel sounds present 8. Lower extremities: no clubbing, cyanosis, no  edema 9. Neurologically Grossly intact, moving all 4 extremities equally  10. MSK: Normal range of motion   LABS:     Recent Labs  Lab 03/27/18 0929 03/28/18 1616  WBC 5.9 8.0  NEUTROABS 3.9 5.2  HGB 12.6 14.4  HCT 39.8 45.8  MCV 98.0 95.6  PLT 141* 951   Basic Metabolic Panel: Recent Labs  Lab 03/27/18 0929 03/28/18 1616  NA 139 137  K 3.7 3.8  CL 107 104  CO2 25 25  GLUCOSE 167* 148*  BUN 15 12  CREATININE 0.79 0.87  CALCIUM 9.1 9.5      Recent Labs  Lab 03/27/18 0929 03/28/18 1616  AST 70* 69*  ALT 41 40  ALKPHOS 131* 105  BILITOT 1.1 1.1  PROT 5.7* 5.5*  ALBUMIN 2.8* 2.6*   Recent Labs  Lab 03/28/18 1616  LIPASE 33   No results for input(s): AMMONIA in the last 168 hours.    HbA1C: No results for input(s): HGBA1C in the last 72 hours. CBG: No results for input(s): GLUCAP in the last 168 hours.    Urine analysis:    Component Value Date/Time   COLORURINE YELLOW 12/03/2017 1411   [place]  12/03/2017 1411   LABSPEC  1.011 12/03/2017 1411   PHURINE 6.0 12/03/2017 1411   GLUCOSEU NEGATIVE 12/03/2017 1411   GLUCOSEU NEGATIVE 03/05/2010 1006   HGBUR NEGATIVE 12/03/2017 1411   BILIRUBINUR NEGATIVE 12/03/2017 1411   KETONESUR NEGATIVE 12/03/2017 1411   PROTEINUR NEGATIVE 12/03/2017 1411   UROBILINOGEN 0.2 06/25/2011 2040   NITRITE NEGATIVE 12/03/2017 1411   LEUKOCYTESUR NEGATIVE 12/03/2017 1411    Cultures:    Component Value Date/Time   SDES  12/03/2017 1414    BLOOD LEFT ARM Performed at [facility], [place] [address], [place], [place] [address]    [place]  12/03/2017 1414    BOTTLES DRAWN AEROBIC AND ANAEROBIC Blood Culture adequate volume   CULT  12/03/2017 1414    NO GROWTH 5 DAYS Performed at [facility], [place] [address] [place], [place] [address]    REPTSTATUS 12/08/2017 FINAL 12/03/2017 1414     Radiological Exams on Admission: Dg Chest 2 View  Result Date: 03/28/2018 CLINICAL DATA:  Shortness of breath since Saturday, worse today. EXAM: CHEST - 2 VIEW COMPARISON:  Chest x-rays dated 03/27/2018, 02/15/2018 and 12/03/2017. FINDINGS: Mild cardiomegaly. RIGHT chest wall Port-A-Cath in place with tip at the level of the mid SVC. Mild interstitial edema. Lungs otherwise clear. No confluent opacity to suggest a developing pneumonia. No pleural effusion or pneumothorax seen. Osseous structures about the chest are unremarkable. IMPRESSION: Mild cardiomegaly. Mild interstitial edema suggesting mild CHF/volume overload. No evidence of pneumonia or overt alveolar pulmonary edema. Electronically Signed   By: Franki Cabot M.D.   On: 03/28/2018 16:57   Dg Chest 2 View  Result Date: 03/27/2018 CLINICAL DATA:  Cough, congestion, and shortness of breath. History of metastatic breast cancer. EXAM: CHEST - 2 VIEW COMPARISON:  Chest x-ray dated February 15, 2018. FINDINGS: Unchanged right chest wall port catheter. The heart size and mediastinal contours are within normal  limits. Both lungs are clear. The visualized skeletal structures are unremarkable. IMPRESSION: No active cardiopulmonary disease. Electronically Signed   By: Titus Dubin M.D.   On: 03/27/2018 10:27   Ct Angio Chest Pe W And/or Wo Contrast  Result Date: 03/28/2018 CLINICAL DATA:  Productive cough EXAM: CT ANGIOGRAPHY CHEST WITH CONTRAST TECHNIQUE: Multidetector CT imaging of the chest was performed using the standard protocol during bolus administration of intravenous contrast. Multiplanar CT image reconstructions and MIPs were obtained to evaluate the vascular anatomy. CONTRAST:  22mL ISOVUE-370 IOPAMIDOL (ISOVUE-370) INJECTION 76% COMPARISON:  03/11/2018 FINDINGS: Cardiovascular: Right Port-A-Cath remains in place, unchanged. No filling defects in the pulmonary arteries to suggest pulmonary emboli. Heart is upper limits normal in size. Aorta is normal caliber. Mediastinum/Nodes: No mediastinal, hilar, or axillary adenopathy. Lungs/Pleura: Small left pleural effusion. No confluent airspace opacities or effusions. Upper Abdomen: Low-density areas throughout the liver compatible with diffuse hepatic metastases as better seen on recent study. Musculoskeletal: Changes of left mastectomy and lymph node dissection. Stable T9 lucent lesion dating back to 2013 compatible with benign lesion, likely hemangioma. No acute bony abnormality. Review of the MIP images confirms the above findings. IMPRESSION: No evidence of pulmonary embolus. Borderline heart size. Small left pleural effusion. No acute cardiopulmonary disease. Diffuse a patent metastases again noted, stable since recent study. Electronically Signed   By: Rolm Baptise M.D.   On: 03/28/2018 18:07    Chart has been reviewed    Assessment/Plan  78 y.o. female with medical history significant of Breast cancer, on chemo resulting in cardiomyopathy EF 30%,  DM2 HLD Admitted for bronchitis vs CHF exacerbation  Acute on Chronic combined systolic and  diastolic CHF (congestive heart failure) ([place]) difficult to manage given soft blood pressures and recent decreased p.o. intake.  Given a dose of Lasix in ER with some diuresis will consult cardiology to help manage with diuresis currently shortness of breath could be also secondary to bronchitis.  Cycle cardiac enzymes and monitor on telemetry had recent echogram done only 10 days ago will not repeat . Acute bronchitis with exacerbation- switch from coreg to zebeta  -  - Will initiate: Steroid taper  -  Antibiotics  Doxycycline, - Albuterol  PRN, - scheduled atrovent  -  Breo or Dulera at discharge   -  Mucinex.  Titrate O2 to saturation >90%. Follow patients respiratory status.   nfluenza PCR negative   . HYPERCHOLESTEROLEMIA -stable continue home medications . Essential hypertension -given soft blood pressures we will hold off for tonight resume tomorrow if able to tolerate . Primary cancer of lower-inner quadrant of left female breast [facility]) -continue home medications notify oncology patient has been admitted . GERD stable continue home medications . Dilated cardiomyopathy ([place]) -severe will email to cardiology patient has been admitted will need to help with diuresis given soft blood pressures and significant history of severe CHF   DM 2 -  - Order Sensitive SSI     -  check TSH and HgA1C  - Hold by mouth medications      Other plan as per orders.  DVT prophylaxis:   Lovenox     Code Status:  FULL CODE   as per patient   I had personally discussed CODE STATUS with patient   Family Communication:   Family not at  Bedside    Disposition Plan:   To home once workup is complete and patient is stable                      Would benefit from PT/OT eval prior to DC  Ordered                                      Consults called:    email cardiology   Admission status:   Obs    Level of care    tele  For  24H         Lainee Lehrman 03/28/2018, 10:22 PM    [facility]     after 2 AM please page floor coverage PA If 7AM-7PM, please contact the day team taking care of the patient using [email or web address]

## 2018-03-29 ENCOUNTER — Other Ambulatory Visit: Payer: Self-pay

## 2018-03-29 ENCOUNTER — Encounter (HOSPITAL_COMMUNITY): Payer: Self-pay | Admitting: General Practice

## 2018-03-29 DIAGNOSIS — C50312 Malignant neoplasm of lower-inner quadrant of left female breast: Secondary | ICD-10-CM | POA: Diagnosis not present

## 2018-03-29 DIAGNOSIS — R0902 Hypoxemia: Secondary | ICD-10-CM | POA: Diagnosis not present

## 2018-03-29 DIAGNOSIS — I5023 Acute on chronic systolic (congestive) heart failure: Secondary | ICD-10-CM | POA: Diagnosis present

## 2018-03-29 DIAGNOSIS — I493 Ventricular premature depolarization: Secondary | ICD-10-CM | POA: Diagnosis not present

## 2018-03-29 DIAGNOSIS — T451X5A Adverse effect of antineoplastic and immunosuppressive drugs, initial encounter: Secondary | ICD-10-CM | POA: Diagnosis not present

## 2018-03-29 DIAGNOSIS — E785 Hyperlipidemia, unspecified: Secondary | ICD-10-CM | POA: Diagnosis not present

## 2018-03-29 DIAGNOSIS — Z170 Estrogen receptor positive status [ER+]: Secondary | ICD-10-CM | POA: Diagnosis not present

## 2018-03-29 DIAGNOSIS — E119 Type 2 diabetes mellitus without complications: Secondary | ICD-10-CM | POA: Diagnosis not present

## 2018-03-29 DIAGNOSIS — J811 Chronic pulmonary edema: Secondary | ICD-10-CM | POA: Diagnosis not present

## 2018-03-29 DIAGNOSIS — Z7722 Contact with and (suspected) exposure to environmental tobacco smoke (acute) (chronic): Secondary | ICD-10-CM | POA: Diagnosis not present

## 2018-03-29 DIAGNOSIS — I110 Hypertensive heart disease with heart failure: Secondary | ICD-10-CM | POA: Diagnosis not present

## 2018-03-29 DIAGNOSIS — I5042 Chronic combined systolic (congestive) and diastolic (congestive) heart failure: Secondary | ICD-10-CM | POA: Diagnosis not present

## 2018-03-29 DIAGNOSIS — E7800 Pure hypercholesterolemia, unspecified: Secondary | ICD-10-CM | POA: Diagnosis not present

## 2018-03-29 DIAGNOSIS — E876 Hypokalemia: Secondary | ICD-10-CM | POA: Diagnosis not present

## 2018-03-29 DIAGNOSIS — M6281 Muscle weakness (generalized): Secondary | ICD-10-CM | POA: Diagnosis not present

## 2018-03-29 DIAGNOSIS — I472 Ventricular tachycardia: Secondary | ICD-10-CM | POA: Diagnosis not present

## 2018-03-29 DIAGNOSIS — C787 Secondary malignant neoplasm of liver and intrahepatic bile duct: Secondary | ICD-10-CM | POA: Diagnosis not present

## 2018-03-29 DIAGNOSIS — I420 Dilated cardiomyopathy: Secondary | ICD-10-CM | POA: Diagnosis not present

## 2018-03-29 DIAGNOSIS — M8580 Other specified disorders of bone density and structure, unspecified site: Secondary | ICD-10-CM | POA: Diagnosis not present

## 2018-03-29 DIAGNOSIS — E440 Moderate protein-calorie malnutrition: Secondary | ICD-10-CM | POA: Diagnosis not present

## 2018-03-29 DIAGNOSIS — I427 Cardiomyopathy due to drug and external agent: Secondary | ICD-10-CM | POA: Diagnosis not present

## 2018-03-29 DIAGNOSIS — I5043 Acute on chronic combined systolic (congestive) and diastolic (congestive) heart failure: Secondary | ICD-10-CM | POA: Diagnosis not present

## 2018-03-29 DIAGNOSIS — J209 Acute bronchitis, unspecified: Secondary | ICD-10-CM | POA: Diagnosis not present

## 2018-03-29 DIAGNOSIS — K219 Gastro-esophageal reflux disease without esophagitis: Secondary | ICD-10-CM | POA: Diagnosis not present

## 2018-03-29 DIAGNOSIS — Z6826 Body mass index (BMI) 26.0-26.9, adult: Secondary | ICD-10-CM | POA: Diagnosis not present

## 2018-03-29 LAB — COMPREHENSIVE METABOLIC PANEL
ALT: 37 U/L (ref 0–44)
AST: 65 U/L — ABNORMAL HIGH (ref 15–41)
Albumin: 2.4 g/dL — ABNORMAL LOW (ref 3.5–5.0)
Alkaline Phosphatase: 95 U/L (ref 38–126)
Anion gap: 11 (ref 5–15)
BILIRUBIN TOTAL: 1.5 mg/dL — AB (ref 0.3–1.2)
BUN: 14 mg/dL (ref 8–23)
CO2: 26 mmol/L (ref 22–32)
Calcium: 9.6 mg/dL (ref 8.9–10.3)
Chloride: 103 mmol/L (ref 98–111)
Creatinine, Ser: 1.02 mg/dL — ABNORMAL HIGH (ref 0.44–1.00)
GFR calc Af Amer: >60 mL/min (ref >60)
GFR calc non Af Amer: 53 mL/min — ABNORMAL LOW (ref >60)
GLUCOSE: 116 mg/dL — AB (ref 70–99)
Potassium: 3.4 mmol/L — ABNORMAL LOW (ref 3.5–5.1)
Sodium: 140 mmol/L (ref 135–145)
TOTAL PROTEIN: 5.5 g/dL — AB (ref 6.5–8.1)

## 2018-03-29 LAB — RESPIRATORY PANEL BY PCR

## 2018-03-29 LAB — CBC
HCT: 42.6 % (ref 36.0–46.0)
Hemoglobin: 13.4 g/dL (ref 12.0–15.0)
MCH: 30.1 pg (ref 26.0–34.0)
MCHC: 31.5 g/dL (ref 30.0–36.0)
MCV: 95.7 fL (ref 80.0–100.0)
Platelets: 156 10*3/uL (ref 150–400)
RBC: 4.45 MIL/uL (ref 3.87–5.11)
RDW: 15.3 % (ref 11.5–15.5)
WBC: 6.7 10*3/uL (ref 4.0–10.5)
nRBC: 0 % (ref 0.0–0.2)

## 2018-03-29 LAB — GLUCOSE, CAPILLARY
Glucose-Capillary: 113 mg/dL — ABNORMAL HIGH (ref 70–99)
Glucose-Capillary: 148 mg/dL — ABNORMAL HIGH (ref 70–99)
Glucose-Capillary: 157 mg/dL — ABNORMAL HIGH (ref 70–99)
Glucose-Capillary: 233 mg/dL — ABNORMAL HIGH (ref 70–99)
Glucose-Capillary: 234 mg/dL — ABNORMAL HIGH (ref 70–99)

## 2018-03-29 LAB — MAGNESIUM
Magnesium: 1.5 mg/dL — ABNORMAL LOW (ref 1.7–2.4)
Magnesium: 1.8 mg/dL (ref 1.7–2.4)

## 2018-03-29 LAB — HEMOGLOBIN A1C
Hgb A1c MFr Bld: 5.8 % — ABNORMAL HIGH (ref 4.8–5.6)
Mean Plasma Glucose: 119.76 mg/dL

## 2018-03-29 LAB — LACTIC ACID, PLASMA: Lactic Acid, Venous: 3.3 mmol/L (ref 0.5–1.9)

## 2018-03-29 LAB — TROPONIN I
Troponin I: 0.05 ng/mL
Troponin I: 0.05 ng/mL (ref <0.03)

## 2018-03-29 LAB — PHOSPHORUS: Phosphorus: 3.6 mg/dL (ref 2.5–4.6)

## 2018-03-29 LAB — TSH: TSH: 1.646 u[IU]/mL (ref 0.350–4.500)

## 2018-03-29 MED ORDER — IPRATROPIUM BROMIDE 0.02 % IN SOLN: 0.5000 mg | RESPIRATORY_TRACT | Status: DC | PRN

## 2018-03-29 MED ORDER — VITAMIN B-12 1000 MCG PO TABS
500.0000 ug | ORAL_TABLET | Freq: Every day | ORAL | Status: DC
  Administered 2018-03-30: 500 ug via ORAL
  Filled 2018-03-29: qty 1

## 2018-03-29 MED ORDER — SODIUM CHLORIDE 0.9% FLUSH
10.0000 mL | INTRAVENOUS | Status: DC | PRN
  Administered 2018-03-29: 10 mL
  Administered 2018-03-30: 20 mL
  Administered 2018-03-30: 10 mL
  Filled 2018-03-29 (×3): qty 40

## 2018-03-29 MED ORDER — ADULT MULTIVITAMIN W/MINERALS CH
1.0000 | ORAL_TABLET | Freq: Every day | ORAL | Status: DC
  Administered 2018-03-30: 1 via ORAL
  Filled 2018-03-29: qty 1

## 2018-03-29 MED ORDER — FUROSEMIDE 10 MG/ML IJ SOLN: 20.0000 mg | Freq: Two times a day (BID) | INTRAMUSCULAR | Status: DC

## 2018-03-29 MED ORDER — SPIRONOLACTONE 12.5 MG HALF TABLET
12.5000 mg | ORAL_TABLET | Freq: Every day | ORAL | Status: DC
  Administered 2018-03-29 – 2018-03-30 (×2): 12.5 mg via ORAL
  Filled 2018-03-29 (×3): qty 1

## 2018-03-29 MED ORDER — SPIRONOLACTONE 12.5 MG HALF TABLET: 12.5000 mg | ORAL_TABLET | ORAL | Status: DC

## 2018-03-29 MED ORDER — MAGNESIUM OXIDE 400 (241.3 MG) MG PO TABS
400.0000 mg | ORAL_TABLET | Freq: Every day | ORAL | Status: DC
  Administered 2018-03-30: 400 mg via ORAL
  Filled 2018-03-29: qty 1

## 2018-03-29 MED ORDER — ENSURE ENLIVE PO LIQD
237.0000 mL | Freq: Two times a day (BID) | ORAL | Status: DC
  Administered 2018-03-29 – 2018-03-30 (×2): 237 mL via ORAL

## 2018-03-29 MED ORDER — MAGNESIUM SULFATE 2 GM/50ML IV SOLN
2.0000 g | Freq: Once | INTRAVENOUS | Status: DC
  Filled 2018-03-29: qty 50

## 2018-03-29 MED ORDER — MAGNESIUM SULFATE 2 GM/50ML IV SOLN
2.0000 g | Freq: Once | INTRAVENOUS | Status: AC
  Administered 2018-03-29: 2 g via INTRAVENOUS
  Filled 2018-03-29: qty 50

## 2018-03-29 MED ORDER — LOSARTAN POTASSIUM 25 MG PO TABS
25.0000 mg | ORAL_TABLET | Freq: Every day | ORAL | Status: DC
  Administered 2018-03-29: 25 mg via ORAL
  Filled 2018-03-29: qty 1

## 2018-03-29 MED ORDER — POTASSIUM CHLORIDE CRYS ER 20 MEQ PO TBCR
40.0000 meq | EXTENDED_RELEASE_TABLET | Freq: Once | ORAL | Status: AC
  Administered 2018-03-29: 40 meq via ORAL
  Filled 2018-03-29: qty 2

## 2018-03-29 MED ORDER — MAGNESIUM SULFATE 2 GM/50ML IV SOLN
2.0000 g | Freq: Once | INTRAVENOUS | Status: DC
  Administered 2018-03-29: 2 g via INTRAVENOUS
  Filled 2018-03-29: qty 50

## 2018-03-29 MED ORDER — ALBUTEROL SULFATE (2.5 MG/3ML) 0.083% IN NEBU
2.5000 mg | INHALATION_SOLUTION | Freq: Two times a day (BID) | RESPIRATORY_TRACT | Status: DC
  Administered 2018-03-29 – 2018-03-30 (×3): 2.5 mg via RESPIRATORY_TRACT
  Filled 2018-03-29 (×3): qty 3

## 2018-03-29 MED ORDER — VITAMIN D 25 MCG (1000 UNIT) PO TABS
1000.0000 [IU] | ORAL_TABLET | Freq: Every day | ORAL | Status: DC
  Administered 2018-03-30: 1000 [IU] via ORAL
  Filled 2018-03-29: qty 1

## 2018-03-29 MED ORDER — ENSURE ENLIVE PO LIQD
237.0000 mL | Freq: Two times a day (BID) | ORAL | Status: DC
  Administered 2018-03-29: 237 mL via ORAL

## 2018-03-29 MED ORDER — TRIAMCINOLONE ACETONIDE 55 MCG/ACT NA AERO
2.0000 | INHALATION_SPRAY | Freq: Every day | NASAL | Status: DC
  Administered 2018-03-29: 2 via NASAL
  Filled 2018-03-29: qty 21.6

## 2018-03-29 NOTE — Plan of Care (Signed)
  Problem: Clinical Measurements: Goal: Will remain free from infection Outcome: Progressing   Problem: Coping: Goal: Level of anxiety will decrease Outcome: Progressing   Problem: Safety: Goal: Ability to remain free from injury will improve Outcome: Progressing   

## 2018-03-29 NOTE — Plan of Care (Signed)
  Problem: Clinical Measurements: Goal: Will remain free from infection Outcome: Progressing   Problem: Activity: Goal: Risk for activity intolerance will decrease Outcome: Progressing   Problem: Elimination: Goal: Will not experience complications related to bowel motility Outcome: Progressing   Problem: Safety: Goal: Ability to remain free from injury will improve Outcome: Progressing   Problem: Activity: Goal: Capacity to carry out activities will improve Outcome: Progressing

## 2018-03-29 NOTE — Evaluation (Signed)
Occupational Therapy Evaluation Patient Details Name: [name] MRN: [id number] DOB: [birth year] Today's Date: 03/29/2018    History of Present Illness Pt is a 78 y.o. female admitted 03/28/18 with SOB and chest pain. Worked up for bronchitis versus CHF exacerbation. 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   Clinical Impression   Pt able to ambulate with cane and using IV pole with supervision, LB ADLs with supervision. Pt reports that she feels better. All education competed and no further acute OT is indicated at this time    Follow Up Recommendations  No OT follow up;Supervision - Intermittent    Equipment Recommendations  Tub/shower bench    Recommendations for Other Services       Precautions / Restrictions Precautions Precautions: Fall Restrictions Weight Bearing Restrictions: No      Mobility Bed Mobility               General bed mobility comments: pt in recliner upon arrival  Transfers Overall transfer level: Needs assistance Equipment used: None Transfers: Sit to/from Stand Sit to Stand: Supervision         General transfer comment: Pt reaching to table to steady self upon standing    Balance Overall balance assessment: Needs assistance Sitting-balance support: No upper extremity supported Sitting balance-Leahy Scale: Good     Standing balance support: Single extremity supported;Bilateral upper extremity supported;During functional activity Standing balance-Leahy Scale: Fair                             ADL either performed or assessed with clinical judgement   ADL Overall ADL's : Needs assistance/impaired Eating/Feeding: Independent   Grooming: Wash/dry hands;Wash/dry face;Supervision/safety;Standing   Upper Body Bathing: Modified independent   Lower Body Bathing: Supervison/ safety;Sit to/from stand   Upper Body Dressing : Modified independent   Lower Body Dressing:  Supervision/safety;Sit to/from stand   Toilet Transfer: Supervision/safety;Ambulation   Toileting- Clothing Manipulation and Hygiene: Supervision/safety;Sit to/from stand   Tub/ Shower Transfer: Supervision/safety;Ambulation;Grab bars;3 in 1   Functional mobility during ADLs: Supervision/safety;Cane       Vision Patient Visual Report: No change from baseline       Perception     Praxis      Pertinent Vitals/Pain Pain Assessment: No/denies pain Faces Pain Scale: Hurts a little bit Pain Location: Head Pain Descriptors / Indicators: Headache Pain Intervention(s): Monitored during session     Hand Dominance Right   Extremity/Trunk Assessment Upper Extremity Assessment Upper Extremity Assessment: Overall WFL for tasks assessed   Lower Extremity Assessment Lower Extremity Assessment: Defer to PT evaluation   Cervical / Trunk Assessment Cervical / Trunk Assessment: Normal   Communication Communication Communication: No difficulties   Cognition Arousal/Alertness: Awake/alert Behavior During Therapy: WFL for tasks assessed/performed Overall Cognitive Status: Within Functional Limits for tasks assessed                                     General Comments       Exercises     Shoulder Instructions      Home Living Family/patient expects to be discharged to:: Private residence Living Arrangements: Spouse/significant other;Children Available Help at Discharge: Family;Available 24 hours/day Type of Home: House Home Access: Stairs to enter [employer] of Steps: 3 Entrance Stairs-Rails: Right;Can reach both;Left Home Layout: One level     Bathroom Shower/Tub:  Tub/shower unit   Bathroom Toilet: Handicapped height     Home Equipment: Shower seat;Cane - single point;Walker - 2 wheels   Additional Comments: Between husband and son, someone typically home with pt 24/7      Prior Functioning/Environment Level of Independence:  Independent with assistive device(s)        Comments: Intermittent use of SPC. Indep with ADLs. Does not drive unless she has to        OT Problem List: Decreased activity tolerance      OT Treatment/Interventions: Self-care/ADL training    OT Goals(Current goals can be found in the care plan section) Acute Rehab OT Goals Patient Stated Goal: Return home OT Goal Formulation: With patient  OT Frequency:     Barriers to D/C:    no barriers       Co-evaluation              AM-PAC OT "6 Clicks" Daily Activity     Outcome Measure Help from another person eating meals?: None Help from another person taking care of personal grooming?: None Help from another person toileting, which includes using toliet, bedpan, or urinal?: None Help from another person bathing (including washing, rinsing, drying)?: None Help from another person to put on and taking off regular upper body clothing?: None Help from another person to put on and taking off regular lower body clothing?: None 6 Click Score: 24   End of Session Equipment Utilized During Treatment: Gait belt;Other (comment)(cane)  Activity Tolerance: Patient tolerated treatment well Patient left: in chair;with call bell/phone within reach  OT Visit Diagnosis: Unsteadiness on feet (R26.81)                Time: [id number] OT Time Calculation (min): 25 min Charges:  OT General Charges $OT Visit: 1 Visit OT Evaluation $OT Eval Moderate Complexity: 1 Mod OT Treatments $Self Care/Home Management : 8-22 mins    Britt Bottom 03/29/2018, 10:29 AM

## 2018-03-29 NOTE — Consult Note (Addendum)
Advanced Heart Failure Team Consult Note   Primary Physician: Vicenta Aly, FNP PCP-Cardiologist:  Dr Haroldine Laws  Reason for Consultation: SOB  HPI:    [name] is seen today for evaluation of SOB at the request of Dr Herbert Moors.   [name] is a 78 y.o. female with past medical history of chronic systolic HF due to NICM (EF 35%), HTN, HLD, DM2, and metastatic breast cancer to liver.  She has been followed in cardio-oncology clinic with Dr Haroldine Laws since 12/2017. EF was  She was last seen 03/18/18. She had received 4 doses of Herceptin. Echo was done and showed worsening EF 20-25%. Her losartan was increased and she was set up for cardiac MRI. She was given lasix to take PRN. Advised to hold Herceptin with drop in EF and repeat echo in 2 months.   She started feeling poorly on 03/26/18 with congestion and SOB. No orthopnea, PND, or edema. +chills and +sweats. She developed a productive cough with brown sputum. She has not had to take any PRN lasix at home.   Seen in ED 03/27/18 with facial fullness, congestion, and SOB. Symptoms thought to be related to URI. CXR was negative. BNP was 250.  She presented to [facility] 03[facility]28[facility]18 with SOB, CP, and cough. She had CP throughout her entire chest that started on Sunday. It was constant. She was febrile to 100.4 on admit. Pertinent admission labs include: Flu negative, BNP 384, troponin 0.05 -> 0.05 -> 0.05, Na 137, K 3.8, creatinine 0.87, lactic acid 1.8 -> 4.2 -> 3.3, WBC 8.0, hemoglobin 14.4 CXR Mild CHF. CTA no PE, small left pleural effusion, no acute cardiopulmonary disease, diffuse hepatic mets (stable since last study)   She was given 40 mg IV lasix in ED initially with 400 cc UOP. She was also started on oral doxy. Blood cx are pending. She got a 500 cc bolus overnight and was started on prednisone for elevated lactate and fever.   Creatinine is stable 1.02, K 3.4, mag 1.5. Lactic acid improved 3.3 this morning. SBP  100-110s. She is afebrile this am.   She feels less SOB, but is not back to baseline. +sweats. Her chest pain is now in epigastric area and is tender if you push on it.   Studies:  EF 2017 35-40%   Cath in 05/2015 with no CAD.   Echo 11/24/17 LVEF 30-35%, Grade 1 DD, Mild PI, significant dyssynchrony.   Echo 03/18/18: EF 20-25%  PET CT scan 11/04/2017: Heavy burden of metastatic disease to the liver involving all lobes, largest 7.7 x 5.4 cm with SUV of 16.6, additional hypermetabolic left supraclavicular, left subpectoral, portacaval and aortocaval lymphadenopathy  Review of Systems: [y] = yes, _0  = no   . General: Weight gain _1 ; Weight loss _2 ; Anorexia Blue.Reese ]; Fatigue Blue.Reese ]; Fever Blue.Reese ]; Chills Blue.Reese ]; Weakness [ y]  . Cardiac: Chest pain/pressure Blue.Reese ]; Resting SOB _3 ; Exertional SOB Blue.Reese ]; Orthopnea _4 ; Pedal Edema _5 ; Palpitations _6 ; Syncope _7 ; Presyncope _8 ; Paroxysmal nocturnal dyspnea_9   . Pulmonary: Cough Blue.Reese ]; Wheezing_10 ; Hemoptysis_11 ; Sputum Blue.Reese ]; Snoring _12   . GI: Vomiting_13 ; Dysphagia_14 ; Melena_15 ; Hematochezia _16 ; Heartburn_17 ; Abdominal pain Blue.Reese ]; Constipation _18 ; Diarrhea _19 ; BRBPR _20   . GU: Hematuria_21 ; Dysuria _22 ; Nocturia_23   . Vascular: Pain in legs with walking _24 ; Pain in feet with  lying flat _0 ; Non-healing sores _1 ; Stroke _2 ; TIA _3 ; Slurred speech _4 ;  . Neuro: Headaches_5 ; Vertigo_6 ; Seizures_7 ; Paresthesias_8 ;Blurred vision _9 ; Diplopia _10 ; Vision changes _11   . Ortho/Skin: Arthritis [ y]; Joint pain Blue.Reese ]; Muscle pain _12 ; Joint swelling _13 ; Back Pain _14 ; Rash _15   . Psych: Depression_16 ; Anxiety_17   . Heme: Bleeding problems _18 ; Clotting disorders _19 ; Anemia _20   . Endocrine: Diabetes _21 ; Thyroid dysfunction_22   Home Medications Prior to Admission medications   Medication Sig Start Date End Date Taking? Authorizing Provider  acyclovir (ZOVIRAX) 200 MG capsule TAKE 2 CAPSULES BY MOUTH 3 TIMES DAILY Patient taking differently:  Take 400 mg by mouth 3 (three) times daily.  10/23/13  Yes Noralee Space, MD  ALPRAZolam Duanne Moron) 0.5 MG tablet TAKE ONE-HALF TO ONE TABLET BY MOUTH THREE TIMES DAILY AS NEEDED FOR  NERVES Patient taking differently: Take 0.5 mg by mouth 3 (three) times daily as needed for anxiety.  02/23/13  Yes Noralee Space, MD  amiodarone (PACERONE) 200 MG tablet Take 1 tablet (200 mg total) by mouth daily. 01/04/18  Yes Bensimhon, Shaune Pascal, MD  carvedilol (COREG) 6.25 MG tablet TAKE 1 TABLET TWICE DAILY WITH MEALS Patient taking differently: Take 6.25 mg by mouth 2 (two) times daily with a meal.  12/27/17  Yes Turner, Eber Hong, MD  Chlorpheniramine-DM (CORICIDIN COUGH/COLD) 4-30 MG TABS Take as directed on the packaging 03/27/18  Yes Carmin Muskrat, MD  cholecalciferol (VITAMIN D) 1000 UNITS tablet Take 1 tablet (1,000 Units total) by mouth daily. 02/23/13  Yes Noralee Space, MD  cyanocobalamin 500 MCG tablet Take 1 tablet (500 mcg total) by mouth daily. 02/23/13  Yes Noralee Space, MD  furosemide (LASIX) 20 MG tablet Take 1 tablet (20 mg total) by mouth as needed (for swelling). 03/18/18 06/16/18 Yes Bensimhon, Shaune Pascal, MD  ketoconazole (NIZORAL) 2 % cream Apply 1 application topically daily. 03/15/18  Yes Nicholas Lose, MD  letrozole [facility]) 2.5 MG tablet Take 2.5 mg by mouth daily.  12/14/17  Yes [provider]  liraglutide (VICTOZA) 18 MG/3ML SOPN Inject 0.3 mLs (1.8 mg total) into the skin daily. 12/16/17  Yes Nicholas Lose, MD  LORazepam (ATIVAN) 0.5 MG tablet Take 1 tablet (0.5 mg total) by mouth at bedtime as needed for sleep. 03/15/18  Yes Nicholas Lose, MD  losartan (COZAAR) 25 MG tablet Take 1 tablet (25 mg total) by mouth at bedtime. 03/18/18 06/16/18 Yes Bensimhon, Shaune Pascal, MD  magnesium oxide (MAG-OX) 400 MG tablet Take 1 tablet (400 mg total) by mouth daily. 03/15/18  Yes Nicholas Lose, MD  Multiple Vitamins-Minerals (ONE-A-DAY WOMENS 50+ ADVANTAGE) TABS Take 1 tablet by mouth daily. 02/23/13   Yes Noralee Space, MD  omeprazole (PRILOSEC) 40 MG capsule Take 40 mg by mouth daily.   Yes [provider]  potassium chloride SA (K-DUR,KLOR-CON) 20 MEQ tablet Take 1 tablet (20 mEq total) by mouth as needed (take with lasix as needed). 03/18/18  Yes Bensimhon, Shaune Pascal, MD  sertraline (ZOLOFT) 50 MG tablet Take 1 tablet (50 mg total) by mouth daily. 02/23/13  Yes Noralee Space, MD  spironolactone (ALDACTONE) 25 MG tablet Take 0.5 tablets (12.5 mg total) by mouth every Monday, Wednesday, and Friday. 12/06/17  Yes Florencia Reasons, MD  triamcinolone (NASACORT) 55 MCG/ACT AERO nasal inhaler Place 2 sprays into the nose daily. Use  daily for five days 03/27/18  Yes Carmin Muskrat, MD  amoxicillin (AMOXIL) 500 MG capsule Take four capsules one hour before dental appointment. 01/19/18   Lenn Cal, DDS  Insulin Pen Needle (RELION PEN NEEDLE 31G/8MM) 31G X 8 MM MISC 1 Device by Does not apply route 4 (four) times daily. 10/05/12   Renato Shin, MD  lidocaine-prilocaine (EMLA) cream Apply to affected area once Patient not taking: Reported on 03/27/2018 11/19/17   Nicholas Lose, MD    Past Medical History: Past Medical History:  Diagnosis Date  . ALLERGIC RHINITIS 12/13/2007  . ANXIETY 06/04/2008  . Blood transfusion 1964   post childbirth  . 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 Chronic combined systolic (congestive) and diastolic (congestive) heart failure ([place])   . DEGENERATIVE JOINT DISEASE 12/17/2006  . DEPRESSION 08/10/2007  . Dilated cardiomyopathy ([place]) 05/29/2015   a. Dx 05/2015 - EF 35-40% - felt secondary to XRT and chemo for breast CA. Normal coronaries by cath.  . DM type 2 (diabetes mellitus, type 2) ([place])   . Dyspnea    sometimes when walking , sometimes when sitting  . Essential hypertension   . GERD 12/17/2006  . History of blood transfusion    after child birth  . History of chemotherapy   . History of hiatal hernia   .  History of radiation therapy 10/07/11-11/24/11   left breast,5040 cGy/28 sessions,l chest wall boost=1000cGy/5 sesions  . 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 Hyperlipidemia   . 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 OBESITY 06/04/2008  . 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 Pneumonia   . Premature atrial contractions   . Sinus bradycardia    a. 2018 event monitor which showed NSR with average HR 71, frequent PACs, occasional PVCs, and sinus bradycardia as low as 50.    Past Surgical History: Past Surgical History:  Procedure Laterality Date  . bladder tack  1974  . BREAST EXCISIONAL BIOPSY  11/10/1988   left benign Dr Margot Chimes  . BREAST EXCISIONAL BIOPSY  03/04/1983   Right - two areas - Dr Margot Chimes  . BREAST EXCISIONAL BIOPSY  10/09/1980   right - Dr Margot Chimes  . BREAST EXCISIONAL BIOPSY  10/18/1979   left - Dr Margot Chimes  . BREAST EXCISIONAL BIOPSY  01/02/1994   right - Dr Margot Chimes  . BREAST EXCISIONAL BIOPSY  2017   right  . BREAST SURGERY    . CARDIAC CATHETERIZATION N/A 05/29/2015   Procedure: Left Heart Cath and Coronary Angiography;  Surgeon: Belva Crome, MD;  Location: [place] [facility];  Service: Cardiovascular;  Laterality: N/A;  . CHOLECYSTECTOMY  02/23/1991   LC - Dr Margot Chimes  . COLONOSCOPY    . ENTEROCELE REPAIR  06/08   Dr. Cletis Media  . EVACUATION BREAST HEMATOMA  07/01/2011   Procedure: EVACUATION HEMATOMA BREAST;  Surgeon: Haywood Lasso, MD;  Location: [facility];  Service: General;  Laterality: Left;  Drainage of left mastectomy seroma  . EYE SURGERY Right    catartact  . HIP SURGERY Right 08/27/2010   DR. Maureen Ralphs-  "burcitis removal"  . LAMINECTOMY  1993   s/p-Dr. Rita Ohara  . MASTECTOMY Left 03/23/2011  . MODIFIED RADICAL MASTECTOMY W/ AXILLARY LYMPH NODE DISSECTION Left 03-30-11  . PORT-A-CATH REMOVAL  12/02/2011   Procedure: REMOVAL PORT-A-CATH;  Surgeon: Haywood Lasso, MD;  Location: [place];  Service: General;  Laterality:  N/A;  . PORTACATH PLACEMENT  04/13/2011    Procedure: INSERTION PORT-A-CATH;  Surgeon: Haywood Lasso, MD;  Location: [place];  Service: General;  Laterality: N/A;  porta cath placement,removal of J-P drain  . PORTACATH PLACEMENT Right 11/25/2017   Procedure: INSERTION PORT-A-CATH WITH [place];  Surgeon: Rolm Bookbinder, MD;  Location: [place];  Service: General;  Laterality: Right;  . RADIOACTIVE SEED GUIDED EXCISIONAL BREAST BIOPSY Right 08/09/2015   Procedure: RIGHT RADIOACTIVE SEED GUIDED EXCISIONAL BREAST BIOPSY, RIGHT NIPPLE LESION EXCISION;  Surgeon: Rolm Bookbinder, MD;  Location: [place];  Service: General;  Laterality: Right;  . ROTATOR CUFF REPAIR  8/03   right, Dr. Tonita Cong  . ROTATOR CUFF REPAIR  10/26/2008   left, Dr. Rush Farmer  . TONSILLECTOMY    . VAGINAL HYSTERECTOMY  1974   partial     Family History: Family History  Problem Relation Age of Onset  . COPD Mother   . Arthritis Mother   . Emphysema Mother   . Diabetes Brother   . Cancer Brother        prostate  . Cancer Father        malignant brain tumor  . Mental illness Paternal Grandfather   . Heart disease Other        Sibling  . Diabetes Other        Sibling     Social History: Social History   Socioeconomic History  . Marital status: Married    Spouse name: Not on file  . Number of children: 3  . Years of education: Not on file  . Highest education level: Not on file  Occupational History    Employer: RETIRED  Social Needs  . Financial resource strain: Not on file  . Food insecurity:    Worry: Not on file    Inability: Not on file  . Transportation needs:    Medical: Not on file    Non-medical: Not on file  Tobacco Use  . Smoking status: Never Smoker  . Smokeless tobacco: Never Used  Substance and Sexual Activity  . Alcohol use: No  . Drug use: No  . Sexual activity: Yes  Lifestyle  . Physical activity:    Days per week: Not on file    Minutes per session: Not on file  . Stress: Not on file    Relationships  . Social connections:    Talks on phone: Not on file    Gets together: Not on file    Attends religious service: Not on file    Active member of club or organization: Not on file    Attends meetings of clubs or organizations: Not on file    Relationship status: Not on file  Other Topics Concern  . 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 Cephalexin Hives    Patient indicates that she has used amoxicillin without hives or other allergic reaction.  . Clindamycin Hives  . Oxycodone-Acetaminophen Hives and Itching  . Rocephin [Ceftriaxone Sodium In Dextrose] Hives  . Sulfonamide Derivatives Hives  . Dilaudid [Hydromorphone Hcl] Itching    Objective:    Vital Signs:   Temp:  [97.8 F (36.6 C)-100.4 F (38  C)] 98.5 F (36.9 C) (02/25 0418) Pulse Rate:  [83-96] 94 (02/25 0738) Resp:  [18-33] 20 (02/25 0738) BP: (99-128)/(51-88) 102/60 (02/25 0418) SpO2:  [91 %-100 %] 97 % (02/25 0738) Last BM Date: 03/21/18  Weight change: There were no vitals filed for this visit.  Intake/Output:   Intake/Output Summary (Last 24 hours) at 03/29/2018 1143 Last data filed at 03/29/2018 0805 Gross per 24 hour  Intake 776.53 ml  Output 600 ml  Net 176.53 ml      Physical Exam    General:  Thin. Appears chronically ill. No resp difficulty HEENT: hoarse.  Neck: supple. JVP 7-8. Carotids 2+ bilat; no bruits. No lymphadenopathy or thyromegaly appreciated. Cor: PMI nondisplaced. Regular rate & rhythm. No rubs, gallops or murmurs. Portacath to right chest.  Lungs: clear Abdomen: soft, tender to palpation in epigastric area. nondistended. No hepatosplenomegaly. No bruits or masses. Good bowel sounds. Extremities: no cyanosis, clubbing, rash, edema, warm. Neuro:  alert & orientedx3, cranial nerves grossly intact. moves all 4 extremities w/o difficulty. Affect pleasant   Telemetry   NSR 80s with frequent PVCs (6-17/min). Personally reviewed.   EKG    EKG 2/23 shows NSR with LBBB (QRS 121 ms). Personally reviewed.   Labs   Basic Metabolic Panel: Recent Labs  Lab 03/27/18 0929 03/28/18 1616 03/29/18 0630  NA 139 137 140  K 3.7 3.8 3.4*  CL 107 104 103  CO2 _0 GLUCOSE 167* 148* 116*  BUN _1 CREATININE 0.79 0.87 1.02*  CALCIUM 9.1 9.5 9.6  MG  --   --  1.5*  PHOS  --   --  3.6    Liver Function Tests: Recent Labs  Lab 03/27/18 0929 03/28/18 1616 03/29/18 0630  AST 70* 69* 65*  ALT 41 40 37  ALKPHOS 131* 105 95  BILITOT 1.1 1.1 1.5*  PROT 5.7* 5.5* 5.5*  ALBUMIN 2.8* 2.6* 2.4*   Recent Labs  Lab 03/28/18 1616  LIPASE 33   No results for input(s): AMMONIA in the last 168 hours.  CBC: Recent Labs  Lab 03/27/18 0929 03/28/18 1616 03/29/18 0630  WBC 5.9 8.0 6.7  NEUTROABS 3.9 5.2  --   HGB 12.6 14.4 13.4  HCT 39.8 45.8 42.6  MCV 98.0 95.6 95.7  PLT 141* 169 156    Cardiac Enzymes: Recent Labs  Lab 03/28/18 2123/03/17 03/29/18 0630  TROPONINI 0.05* 0.05*    BNP: BNP (last 3 results) Recent Labs    03/27/18 0929 03/28/18 1616  BNP 257.4* 383.9*    ProBNP (last 3 results) No results for input(s): PROBNP in the last 8760 hours.   CBG: Recent Labs  Lab 03/29/18 0058 03/29/18 0639  GLUCAP 157* 113*    Coagulation Studies: Recent Labs    03/28/18 03-17-23  LABPROT 15.2  INR 1.2     Imaging   Dg Chest 2 View  Result Date: 03/28/2018 CLINICAL DATA:  Shortness of breath since Saturday, worse today. EXAM: CHEST - 2 VIEW COMPARISON:  Chest x-rays dated 03/27/2018, 02/15/2018 and 12/03/2017. FINDINGS: Mild cardiomegaly. RIGHT chest wall Port-A-Cath in place with tip at the level of the mid SVC. Mild interstitial edema. Lungs otherwise clear. No confluent opacity to suggest a developing  pneumonia. No pleural effusion or pneumothorax seen. Osseous structures about the chest are unremarkable. IMPRESSION: Mild cardiomegaly. Mild interstitial edema suggesting mild CHF/volume overload. No evidence of pneumonia or overt alveolar pulmonary edema. Electronically Signed   By: Roxy Horseman.D.  On: 03/28/2018 16:57   Ct Angio Chest Pe W And/or Wo Contrast  Result Date: 03/28/2018 CLINICAL DATA:  Productive cough EXAM: CT ANGIOGRAPHY CHEST WITH CONTRAST TECHNIQUE: Multidetector CT imaging of the chest was performed using the standard protocol during bolus administration of intravenous contrast. Multiplanar CT image reconstructions and MIPs were obtained to evaluate the vascular anatomy. CONTRAST:  82m ISOVUE-370 IOPAMIDOL (ISOVUE-370) INJECTION 76% COMPARISON:  03/11/2018 FINDINGS: Cardiovascular: Right Port-A-Cath remains in place, unchanged. No filling defects in the pulmonary arteries to suggest pulmonary emboli. Heart is upper limits normal in size. Aorta is normal caliber. Mediastinum/Nodes: No mediastinal, hilar, or axillary adenopathy. Lungs/Pleura: Small left pleural effusion. No confluent airspace opacities or effusions. Upper Abdomen: Low-density areas throughout the liver compatible with diffuse hepatic metastases as better seen on recent study. Musculoskeletal: Changes of left mastectomy and lymph node dissection. Stable T9 lucent lesion dating back to 2013 compatible with benign lesion, likely hemangioma. No acute bony abnormality. Review of the MIP images confirms the above findings. IMPRESSION: No evidence of pulmonary embolus. Borderline heart size. Small left pleural effusion. No acute cardiopulmonary disease. Diffuse a patent metastases again noted, stable since recent study. Electronically Signed   By: KRolm BaptiseM.D.   On: 03/28/2018 18:07      Medications:     Current Medications: . acyclovir  400 mg Oral TID  . albuterol  2.5 mg Nebulization BID  . amiodarone  200 mg  Oral Daily  . bisoprolol  5 mg Oral Daily  . [START ON 03/30/2018] cholecalciferol  1,000 Units Oral Daily  . doxycycline  100 mg Oral Q12H  . enoxaparin (LOVENOX) injection  40 mg Subcutaneous Q24H  . feeding supplement (ENSURE ENLIVE)  237 mL Oral BID BM  . furosemide  20 mg Intravenous BID  . guaiFENesin  600 mg Oral BID  . insulin aspart  0-5 Units Subcutaneous QHS  . insulin aspart  0-9 Units Subcutaneous TID WC  . letrozole  2.5 mg Oral Daily  . losartan  25 mg Oral QHS  . [START ON 03/30/2018] magnesium oxide  400 mg Oral Daily  . mometasone-formoterol  1 puff Inhalation BID  . [START ON 03/30/2018] multivitamin with minerals  1 tablet Oral Daily  . potassium chloride  40 mEq Oral Once  . predniSONE  40 mg Oral Q breakfast  . senna  1 tablet Oral BID  . sertraline  50 mg Oral Daily  . sodium chloride flush  3 mL Intravenous Q12H  . [START ON 03/30/2018] spironolactone  12.5 mg Oral Q M,W,F  . triamcinolone  2 spray Nasal Daily  . [START ON 03/30/2018] vitamin B-12  500 mcg Oral Daily     Infusions: . sodium chloride 250 mL (03/29/18 0044)  . magnesium sulfate 1 - 4 g bolus IVPB         Patient Profile   BDELISHA PEADENis a 78y.o. female with past medical history of chronic systolic HF due to NICM (EF 35%), HTN, HLD, DM2, and metastatic breast cancer to liver.  [facility] and [facility]B.   Assessment/Plan   1. SOB - In setting of chills, sweats, and fevers - Elevated lactic acid up to 4.3 -> 3.3 this am.  - I think symptoms are more likely due to infectious process.  - She is on oral doxy. Afebrile this am. WBC 6.7, but she is immunocompromised.   2. Breast Cancer with liver met by biopsy 11/12/17 - Last chemo  10/25, last Neulasta on 10/28 - Taxotere/Herceptin and Perjeta on hold currently with recent low LVEF. 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 Possibly exacerbated by Herceptin. Herceptin now on hold with drop in EF.   3. ?Acute on chronic systolic and diastolic CHF due to NICM. No ICD. - Echo 11/24/17 LVEF 30-35% with diffuse HK and grade 1 DD + dyssynchrony - Cath in 2017 showed normal coronaries.  - Holter 11/19 with 14% PVCs. Felt possibly contributing to cardiomyopathy but PVCs suppressed without improvement - Echo 03/18/18 EF 20-25% - Volume looks okay on exam. SOB has improved. SBP 100-110s. - Hold diuresis for now. She has lasix to take PRN at home, but has not taken.  - Continue bisoprolol 5 mg daily.  - Increase spiro to 12.5 mg daily (taking MWF at home) - Continue losartan 25 mg daily.  - Cardiac MRI ordered as outpatient, but not yet done. Will get this admission.  - Planning to repeat echo 2 months to see if EF improves off of herceptin.   4. Frequent PVCs - Holter 11/19 with 14% PVCs. Felt possibly contributing to cardiomyopathy but PVCs suppressed without improvement - Having frequent PVCs on tele. Mag and K are both low.  - Continue amio 200 mg daily. AST up to 65 today. ALT and TSH normal.  5. Hypomagnesium/Hypokalemia - Mag 1.5. 2 grams already ordered. Give additional 2 grams and recheck tomorrow - K 3.4. Supp given.   6. Chest pain - She had constant CP that started on Sunday throughout chest. Now pain is more in epigastric area and is tender with palpation.  Suspect noncardiac.  - Troponin 0.05 0> 0.05 >0.05 - EKG shows NSR with LBBB (QRS 121 ms). LBBB present on EKG in January, but was not on EKG in November.    Length of Stay: [place], NP  03/29/2018, 11:43 AM  Advanced Heart Failure Team Pager [phone] (M-F;  7a - 4p)  Please contact [facility] for night-coverage after hours (4p -7a ) and weekends on [email or web address]  Patient seen with NP, agree with the above note.    Patient has history of suspected chemo-related cardiomyopathy, echo 2/20 with EF 20-25%.  She was admitted with fevers/rigors and diaphoresis.  Lactate was elevated.  She got 1 dose of IV Lasix with good response.  Feeling better today, just hoarse.  BP and creatinine stable.   On exam, no JVD.  Lungs clear.  Regular S1S2, no S3.  No edema.   I suspect the process bringing her to the hospital is infectious, ?acute bronchitis (no PNA on CXR).   - Continue antibiotics per primary service.   Currently, she looks euvolemic.  She has not been on diuretics at home.   Would continue her home cardiac meds as long as BP and creatinine remain stable.   Loralie Champagne 03/29/2018 2:18 PM

## 2018-03-29 NOTE — Evaluation (Signed)
Physical Therapy Evaluation Patient Details Name: [name] MRN: [id number] DOB: 09-08-1940 Today's Date: 03/29/2018   History of Present Illness  Pt is a 78 y.o. female admitted 03/28/18 with SOB and chest pain. Worked up for bronchitis versus CHF exacerbation. 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    Clinical Impression  Pt presents with an overall decrease in functional mobility secondary to above. PTA, pt indep and lives with family available for 24/7 support. Today, pt ambulatory without device, requiring intermittent min guard due to instability. Discussed recommendation for [facility] use to decrease fall risk upon return home. SpO2 >94% on RA with ambulation (RN notified). Pt would benefit from continued acute PT services to maximize functional mobility and independence prior to d/c with HHPT services.     Follow Up Recommendations Home health PT;Supervision for mobility/OOB    Equipment Recommendations  None recommended by PT    Recommendations for Other Services       Precautions / Restrictions Precautions Precautions: Fall Restrictions Weight Bearing Restrictions: No      Mobility  Bed Mobility               General bed mobility comments: Received sitting EOB  Transfers Overall transfer level: Needs assistance Equipment used: None Transfers: Sit to/from Stand Sit to Stand: Supervision         General transfer comment: Pt reaching to table to steady self upon standing  Ambulation/Gait Ambulation/Gait assistance: Min guard Gait Distance (Feet): 120 Feet Assistive device: None Gait Pattern/deviations: Step-through pattern;Decreased stride length;Staggering right Gait velocity: Decreased Gait velocity interpretation: <1.8 ft/sec, indicate of risk for recurrent falls General Gait Details: Slow, mildly unsteady gait without DME, close min guard for balance; pt intermittently reaching to objects/hallway rail for  support. 1x lateral LOB, pt able to self-correct with min guard. 1x standing rest break secondary to DOE; SpO2 94% on RA  Stairs            Wheelchair Mobility    Modified Rankin (Stroke Patients Only)       Balance Overall balance assessment: Needs assistance Sitting-balance support: No upper extremity supported Sitting balance-Leahy Scale: Good       Standing balance-Leahy Scale: Fair                               Pertinent Vitals/Pain Pain Assessment: Faces Faces Pain Scale: Hurts a little bit Pain Location: Head Pain Descriptors / Indicators: Headache Pain Intervention(s): Limited activity within patient's tolerance    Home Living Family/patient expects to be discharged to:: Private residence Living Arrangements: Spouse/significant other;Children Available Help at Discharge: Family;Available 24 hours/day Type of Home: House Home Access: Stairs to enter Entrance Stairs-Rails: Right;Can reach Software engineer of Steps: 3 Home Layout: One level Home Equipment: Shower seat;Cane - single point;Walker - 2 wheels Additional Comments: Between husband and son, someone typically home with pt 24/7    Prior Function Level of Independence: Independent with assistive device(s)         Comments: Intermittent use of SPC. Indep with ADLs. Does not drive unless she has to     Hand Dominance        Extremity/Trunk Assessment   Upper Extremity Assessment Upper Extremity Assessment: Overall WFL for tasks assessed    Lower Extremity Assessment Lower Extremity Assessment: Generalized weakness       Communication   Communication: No difficulties  Cognition Arousal/Alertness: Awake/alert  Behavior During Therapy: WFL for tasks assessed/performed Overall Cognitive Status: Within Functional Limits for tasks assessed                                        General Comments      Exercises     Assessment/Plan    PT  Assessment Patient needs continued PT services  PT Problem List Decreased strength;Decreased activity tolerance;Decreased balance;Decreased mobility;Cardiopulmonary status limiting activity       PT Treatment Interventions DME instruction;Gait training;Stair training;Functional mobility training;Therapeutic activities;Therapeutic exercise;Balance training;Patient/family education    PT Goals (Current goals can be found in the Care Plan section)  Acute Rehab PT Goals Patient Stated Goal: Return home PT Goal Formulation: With patient Time For Goal Achievement: 04/12/18 Potential to Achieve Goals: Good    Frequency Min 3X/week   Barriers to discharge        Co-evaluation               AM-PAC PT "6 Clicks" Mobility  Outcome Measure Help needed turning from your back to your side while in a flat bed without using bedrails?: None Help needed moving from lying on your back to sitting on the side of a flat bed without using bedrails?: None Help needed moving to and from a bed to a chair (including a wheelchair)?: A Little Help needed standing up from a chair using your arms (e.g., wheelchair or bedside chair)?: A Little Help needed to walk in hospital room?: A Little Help needed climbing 3-5 steps with a railing? : A Little 6 Click Score: 20    End of Session Equipment Utilized During Treatment: Gait belt Activity Tolerance: Patient tolerated treatment well;Patient limited by fatigue Patient left: in chair;with call bell/phone within reach Nurse Communication: Mobility status PT Visit Diagnosis: Other abnormalities of gait and mobility (R26.89);Muscle weakness (generalized) (M62.81)    Time: 1497-0263 PT Time Calculation (min) (ACUTE ONLY): 23 min   Charges:   PT Evaluation $PT Eval Moderate Complexity: 1 Mod PT Treatments $Gait Training: 8-22 mins      Mabeline Caras, PT, DPT Acute Rehabilitation Services  Pager [phone] Office [place] 03/29/2018, 9:00 AM

## 2018-03-29 NOTE — Progress Notes (Signed)
Lactic acid is 3.3 MD aware.

## 2018-03-29 NOTE — Progress Notes (Signed)
Initial Nutrition Assessment  DOCUMENTATION CODES:   Non-severe (moderate) malnutrition in context of chronic illness  INTERVENTION:  Ensure Enlive po BID, each supplement provides 350 kcal and 20 grams of protein, vanilla  Snacks at 2pm and 8pm. Pt likes Chicken salad sandwich and peanut butter and crackers.    MVI with minerals   NUTRITION DIAGNOSIS:   Moderate Malnutrition related to chronic illness(Breast Cancer (was on Chemotherapy) ) as evidenced by energy intake < or equal to 75% for > or equal to 1 month, mild fat depletion, mild muscle depletion.  GOAL:   Patient will meet greater than or equal to 90% of their needs  MONITOR:   PO intake, Supplement acceptance, Weight trends, Labs  REASON FOR ASSESSMENT:   Consult Assessment of nutrition requirement/status  ASSESSMENT:   Pt is 49y F with PMH significant of Breast Cancer (was on Chemotherapy), CHF with EF 30%, DM2, and HLD, HTN. Presented to ED with SOB, worsening chest pain, URI symptoms.  Pt now admitted with SOB URI secondary to Acute Bronchitis with Exacerbation verses CHF NYHA II, Acute, Systolic.  Per chart Pt has not had much to eat for the past few days/decreased p.o. intake and is reflected in Pt report. Pt states that appetite has decreased and intake has been low over the last couple months to 4 month since receiving Chemotherapy. Pt states, "with chemo, nothing tastes good." Pt report that she goes some days not eating then other days she is hungry. On a good day (good day=when Pt eats) breakfast may be a sweet roll or eggs. Later in the morning/early afternoon an Ensure, high protein. Then supper a typical meal of meat, starch, vegetable (like pot roast, potatoes). Pt reports snacking on occasion like 0.5 pimiento cheese or similar sandwich or peanut butter and ritz crackers. On 'bad' days (when she doesn't feel like eating) she drinks liquids like pepsi or ginger ale and an Ensure. Breakfast tray was in room  with 90% eaten, stating "I was hungry today".   Encouraged Pt on the bad days she should drink more of the ensures to help supplement good nutrition in intake is low.   Pt reports that within the last 1.18yrs there has been some weight loss, but noticed more unintentional weight loss within the last 4 months when Chemo started. Pt reports that her usual weight since Cancer diagnosis was 175-180# (3.8-6.5% wt loss), with current weight being 76.5kg (168.3#). Per weight encounters in chart, weights have between 72.6 at lowest to 79.4 kg at highest, fluctuations between encounters.   Pt ambulates with a cain/walker at baseline. Pt is agreeable to Ensure (Vanilla) and snacks to supplement poorer appetite.   Labs reviewed:  CBG (last 3)  Recent Labs    03/29/18 0058 03/29/18 0639 03/29/18 1231  GLUCAP 157* 113* 148*    Medications reviewed and include:  NovoLog 0-5 units and 0-9 units SSI  Prednisone (Deltasone) 40mg  Q Breakfast   NUTRITION - FOCUSED PHYSICAL EXAM:    Most Recent Value  Orbital Region  Mild depletion  Upper Arm Region  Mild depletion  Thoracic and Lumbar Region  No depletion  Buccal Region  Mild depletion  Temple Region  Mild depletion  Clavicle Bone Region  Mild depletion  Clavicle and Acromion Bone Region  Mild depletion  Scapular Bone Region  Mild depletion  Dorsal Hand  Moderate depletion  Patellar Region  Mild depletion  Anterior Thigh Region  Mild depletion  Posterior Calf Region  Mild depletion  Edema (RD Assessment)  None  Hair  Reviewed  Eyes  Reviewed  Mouth  Reviewed  Skin  Reviewed  Nails  Reviewed       Diet Order:   Diet Order            Diet Carb Modified Fluid consistency: Thin; Room service appropriate? Yes  Diet effective now              EDUCATION NEEDS:   Education needs have been addressed  Skin:  Skin Assessment: Reviewed RN Assessment  Last BM:  2/24  Height:   Ht Readings from Last 1 Encounters:  03/15/18 5\' 7"   (1.702 m)    Weight:   Wt Readings from Last 1 Encounters:  03/29/18 76.5 kg    Ideal Body Weight:  61.4 kg  BMI:  Body mass index is 26.41 kg/m.  Estimated Nutritional Needs:   Kcal:  1900-2200  Protein:  100-115 gram  Fluid:  >2L    Herma Carson, [place] Dietetic Intern

## 2018-03-29 NOTE — Care Management Note (Signed)
Case Management Note  Patient Details  Name: [name] MRN: [id number] Date of Birth: [birth year]  Subjective/Objective:    CHF               Action/Plan: Patient lives at home; PCP: Vicenta Aly, [place]; has private insurance with [facility] with prescription drug coverage; pharmacy of choice is [facility] on [address]; no DME at home at this time; [facility] choice offered, pt chose [facility]; [place] with [facility] called for arrangements. CM will continue to follow for progression of care.  Expected Discharge Date:    possibly 04/02/2018              Expected Discharge Plan:  [place]  Discharge planning Services  CM Consult  Choice offered to:  Patient  HH Arranged:  RN, Disease Management, PT [place] Agency:  [facility]  Status of Service:  In process, will continue to follow  Sherrilyn Rist [phone] 03/29/2018, 1:40 PM

## 2018-03-29 NOTE — Progress Notes (Addendum)
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   Assessment & Plan:   Active Problems:   DM type 2 (diabetes mellitus, type 2) ([place])   HYPERCHOLESTEROLEMIA   Essential hypertension   GERD   Primary cancer of lower-inner quadrant of left female breast ([place])   Dilated cardiomyopathy (HCC)   Acute bronchitis   HLD (hyperlipidemia)   Chronic combined systolic and diastolic CHF (congestive heart failure) (HCC)   CHF NYHA class II, acute, systolic (HCC)   #) Acute on chronic systolic heart failure exacerbation: Patient does appear to be mildly volume overloaded but not significantly so. - Hold home oral furosemide 20 mg daily -Continue IV furosemide 20 mg twice daily -Carvedilol changed out for bisoprolol -Continue spironolactone 12.5 mg daily -Continue losartan 25 mg daily  #) Elevated lactate/fever, shortness of breath: Unclear if patient symptoms are related to pneumonia versus another source of infection.  Her fever is relatively low-grade. -Continue oral doxycycline started 03/28/2018 -Follow-up blood cultures ordered 03/28/2018 -Pending respiratory viral panel - Influenza negative -Continue prednisone 40 mg daily -Continue short acting bronchodilators  #) Hypertension: -Continue ARB, spironolactone -Continue beta-blocker  #) Frequent PVCs: -Continue amiodarone 100 mg daily  #) Type 2 diabetes: -Hold home liraglutide -Sliding scale insulin, SHS  #) Metastatic breast cancer: - Continue  letrozole 2.5 mg daily -Continue acyclovir prophylaxis  #) Pain/psych: -Continue sertraline 50 mg daily - Continue PRN lorazepam  Fluids: Restrict Electrolytes: Monitor and supplement Nutrition: Sodium restricted diet   Prophylaxis: Enoxaparin  Disposition: Pending resolution of hypoxia and evaluation of possible infection  Full code   Consultants:   None  Procedures:   None  Antimicrobials:   Oral doxycycline started 03/28/2018   Subjective: This morning the patient reports feeling about the same.  She denies any nausea, vomiting, diarrhea, cough, congestion, rhinorrhea.  She does not report much in the way of orthopnea but does report some lower extremity edema.  She has significant exertional dyspnea.  Objective: Vitals:   03/29/18 0100 03/29/18 0114 03/29/18 0418 03/29/18 0738  BP:   102/60   Pulse:   90 94  Resp: (!) 28  18 20   Temp: 100 F (37.8 C)  98.5 F (36.9 C)   TempSrc: Rectal  Oral   SpO2:  95% 97% 97%    Intake/Output Summary (Last 24 hours) at 03/29/2018 1020 Last data filed at 03/29/2018 0805 Gross per 24 hour  Intake 776.53 ml  Output 600 ml  Net 176.53 ml   There were no vitals filed for this visit.  Examination:  General exam: Appears calm and comfortable  Respiratory system: Mildly increased work of breathing, scattered rhonchi, no wheezes or crackles Cardiovascular system: Distant heart sounds, regular rate and rhythm, no murmurs Gastrointestinal system: Soft, nondistended, no rebound or guarding, plus bowel sounds Central nervous system: Alert and oriented.  Grossly intact, moving all extremities Extremities: Trace lower extremity edema Skin: Port site is clean dry and intact Psychiatry: Judgement and insight appear normal. Mood & affect appropriate.     Data Reviewed: I have personally reviewed following labs and imaging  studies  CBC: Recent Labs  Lab 03/27/18 0929 03/28/18 1616 03/29/18 0630  WBC 5.9 8.0 6.7    NEUTROABS 3.9 5.2  --   HGB 12.6 14.4 13.4  HCT 39.8 45.8 42.6  MCV 98.0 95.6 95.7  PLT 141* 169 025   Basic Metabolic Panel: Recent Labs  Lab 03/27/18 0929 03/28/18 1616 03/29/18 0630  NA 139 137 140  K 3.7 3.8 3.4*  CL 107 104 103  CO2 25 25 26   GLUCOSE 167* 148* 116*  BUN 15 12 14   CREATININE 0.79 0.87 1.02*  CALCIUM 9.1 9.5 9.6  MG  --   --  1.5*  PHOS  --   --  3.6   GFR: Estimated Creatinine Clearance: 49 mL/min (A) (by C-G formula based on SCr of 1.02 mg/dL (H)). Liver Function Tests: Recent Labs  Lab 03/27/18 0929 03/28/18 1616 03/29/18 0630  AST 70* 69* 65*  ALT 41 40 37  ALKPHOS 131* 105 95  BILITOT 1.1 1.1 1.5*  PROT 5.7* 5.5* 5.5*  ALBUMIN 2.8* 2.6* 2.4*   Recent Labs  Lab 03/28/18 1616  LIPASE 33   No results for input(s): AMMONIA in the last 168 hours. Coagulation Profile: Recent Labs  Lab 03/28/18 2125  INR 1.2   Cardiac Enzymes: Recent Labs  Lab 03/28/18 2125 03/29/18 0630  TROPONINI 0.05* 0.05*   BNP (last 3 results) No results for input(s): PROBNP in the last 8760 hours. HbA1C: Recent Labs    03/29/18 0630  HGBA1C 5.8*   CBG: Recent Labs  Lab 03/29/18 0058 03/29/18 0639  GLUCAP 157* 113*   Lipid Profile: No results for input(s): CHOL, HDL, LDLCALC, TRIG, CHOLHDL, LDLDIRECT in the last 72 hours. Thyroid Function Tests: Recent Labs    03/29/18 0630  TSH 1.646   Anemia Panel: No results for input(s): VITAMINB12, FOLATE, FERRITIN, TIBC, IRON, RETICCTPCT in the last 72 hours. Sepsis Labs: Recent Labs  Lab 03/28/18 1616 03/28/18 2125 03/29/18 0630  PROCALCITON  --  0.44  --   LATICACIDVEN 1.8 4.2* 3.3*    No results found for this or any previous visit (from the past 240 hour(s)).       Radiology Studies: Dg Chest 2 View  Result Date: 03/28/2018 CLINICAL DATA:  Shortness of breath since Saturday, worse today. EXAM: CHEST - 2 VIEW COMPARISON:  Chest x-rays dated 03/27/2018, 02/15/2018 and 12/03/2017.  FINDINGS: Mild cardiomegaly. RIGHT chest wall Port-A-Cath in place with tip at the level of the mid SVC. Mild interstitial edema. Lungs otherwise clear. No confluent opacity to suggest a developing pneumonia. No pleural effusion or pneumothorax seen. Osseous structures about the chest are unremarkable. IMPRESSION: Mild cardiomegaly. Mild interstitial edema suggesting mild CHF/volume overload. No evidence of pneumonia or overt alveolar pulmonary edema. Electronically Signed   By: Franki Cabot M.D.   On: 03/28/2018 16:57   Ct Angio Chest Pe W And/or Wo Contrast  Result Date: 03/28/2018 CLINICAL DATA:  Productive cough EXAM: CT ANGIOGRAPHY CHEST WITH CONTRAST TECHNIQUE: Multidetector CT imaging of the chest was performed using the standard protocol during bolus administration of intravenous contrast. Multiplanar CT image reconstructions and MIPs were obtained to evaluate the vascular anatomy. CONTRAST:  28mL ISOVUE-370 IOPAMIDOL (ISOVUE-370) INJECTION 76% COMPARISON:  03/11/2018 FINDINGS: Cardiovascular: Right Port-A-Cath remains in place, unchanged. No filling defects in the pulmonary arteries to suggest pulmonary emboli. Heart is upper limits normal in size. Aorta is normal caliber. Mediastinum/Nodes: No mediastinal, hilar, or axillary adenopathy. Lungs/Pleura: Small left pleural effusion. No confluent airspace  opacities or effusions. Upper Abdomen: Low-density areas throughout the liver compatible with diffuse hepatic metastases as better seen on recent study. Musculoskeletal: Changes of left mastectomy and lymph node dissection. Stable T9 lucent lesion dating back to 2013 compatible with benign lesion, likely hemangioma. No acute bony abnormality. Review of the MIP images confirms the above findings. IMPRESSION: No evidence of pulmonary embolus. Borderline heart size. Small left pleural effusion. No acute cardiopulmonary disease. Diffuse a patent metastases again noted, stable since recent study.  Electronically Signed   By: Rolm Baptise M.D.   On: 03/28/2018 18:07        Scheduled Meds: . acyclovir  400 mg Oral TID  . albuterol  2.5 mg Nebulization BID  . amiodarone  200 mg Oral Daily  . bisoprolol  5 mg Oral Daily  . doxycycline  100 mg Oral Q12H  . enoxaparin (LOVENOX) injection  40 mg Subcutaneous Q24H  . guaiFENesin  600 mg Oral BID  . insulin aspart  0-5 Units Subcutaneous QHS  . insulin aspart  0-9 Units Subcutaneous TID WC  . letrozole  2.5 mg Oral Daily  . mometasone-formoterol  1 puff Inhalation BID  . predniSONE  40 mg Oral Q breakfast  . senna  1 tablet Oral BID  . sertraline  50 mg Oral Daily  . sodium chloride flush  3 mL Intravenous Q12H   Continuous Infusions: . sodium chloride 250 mL (03/29/18 0044)     LOS: 0 days    Time spent: [place], MD [facility]  If 7PM-7AM, please contact night-coverage [email or web address] Password [email or web address] 03/29/2018, 10:20 AM

## 2018-03-30 ENCOUNTER — Other Ambulatory Visit: Payer: Self-pay

## 2018-03-30 ENCOUNTER — Encounter: Payer: Self-pay | Admitting: Hematology and Oncology

## 2018-03-30 ENCOUNTER — Inpatient Hospital Stay (HOSPITAL_COMMUNITY): Payer: Medicare Other

## 2018-03-30 DIAGNOSIS — J209 Acute bronchitis, unspecified: Principal | ICD-10-CM

## 2018-03-30 DIAGNOSIS — I5042 Chronic combined systolic (congestive) and diastolic (congestive) heart failure: Secondary | ICD-10-CM | POA: Diagnosis not present

## 2018-03-30 DIAGNOSIS — I5023 Acute on chronic systolic (congestive) heart failure: Secondary | ICD-10-CM | POA: Diagnosis not present

## 2018-03-30 DIAGNOSIS — E440 Moderate protein-calorie malnutrition: Secondary | ICD-10-CM

## 2018-03-30 DIAGNOSIS — I429 Cardiomyopathy, unspecified: Secondary | ICD-10-CM

## 2018-03-30 LAB — BASIC METABOLIC PANEL
Anion gap: 11 (ref 5–15)
BUN: 19 mg/dL (ref 8–23)
CO2: 24 mmol/L (ref 22–32)
Calcium: 10.5 mg/dL — ABNORMAL HIGH (ref 8.9–10.3)
Chloride: 103 mmol/L (ref 98–111)
Creatinine, Ser: 1.13 mg/dL — ABNORMAL HIGH (ref 0.44–1.00)
GFR calc Af Amer: 54 mL/min — ABNORMAL LOW (ref >60)
GFR calc non Af Amer: 47 mL/min — ABNORMAL LOW (ref >60)
Glucose, Bld: 127 mg/dL — ABNORMAL HIGH (ref 70–99)
Potassium: 4.2 mmol/L (ref 3.5–5.1)
SODIUM: 138 mmol/L (ref 135–145)

## 2018-03-30 LAB — GLUCOSE, CAPILLARY: GLUCOSE-CAPILLARY: 115 mg/dL — AB (ref 70–99)

## 2018-03-30 LAB — CBC
HCT: 43.9 % (ref 36.0–46.0)
Hemoglobin: 14.1 g/dL (ref 12.0–15.0)
MCH: 30.6 pg (ref 26.0–34.0)
MCHC: 32.1 g/dL (ref 30.0–36.0)
MCV: 95.2 fL (ref 80.0–100.0)
Platelets: 170 10*3/uL (ref 150–400)
RBC: 4.61 MIL/uL (ref 3.87–5.11)
RDW: 15 % (ref 11.5–15.5)
WBC: 7 10*3/uL (ref 4.0–10.5)
nRBC: 0 % (ref 0.0–0.2)

## 2018-03-30 LAB — URINE CULTURE

## 2018-03-30 LAB — MAGNESIUM: Magnesium: 2.1 mg/dL (ref 1.7–2.4)

## 2018-03-30 MED ORDER — ANASTROZOLE 1 MG PO TABS: 1.0000 mg | ORAL_TABLET | Freq: Every day | ORAL | 2 refills | Status: AC

## 2018-03-30 MED ORDER — BISOPROLOL FUMARATE 5 MG PO TABS: 5.0000 mg | ORAL_TABLET | Freq: Every day | ORAL | 0 refills | Status: AC

## 2018-03-30 MED ORDER — GADOBUTROL 1 MMOL/ML IV SOLN
10.0000 mL | Freq: Once | INTRAVENOUS | Status: AC | PRN
  Administered 2018-03-30: 10 mL via INTRAVENOUS

## 2018-03-30 MED ORDER — HEPARIN SOD (PORK) LOCK FLUSH 100 UNIT/ML IV SOLN
500.0000 [IU] | INTRAVENOUS | Status: AC | PRN
  Administered 2018-03-30: 500 [IU]

## 2018-03-30 MED ORDER — DOXYCYCLINE HYCLATE 100 MG PO TABS: 100.0000 mg | ORAL_TABLET | Freq: Two times a day (BID) | ORAL | 0 refills | Status: AC

## 2018-03-30 MED ORDER — PREDNISONE 20 MG PO TABS: 40.0000 mg | ORAL_TABLET | Freq: Every day | ORAL | 0 refills | Status: AC

## 2018-03-30 MED ORDER — SPIRONOLACTONE 25 MG PO TABS: 12.5000 mg | ORAL_TABLET | Freq: Every day | ORAL | 6 refills | Status: DC

## 2018-03-30 NOTE — Progress Notes (Signed)
VAST RN consulted regarding unhooking IVF's from pt's port while she is taken to MRI. VAST RN called and spoke with pt's RN, Levada Dy; advised RN can disconnect fluids from port for pt to go to MRI. Also advised RN to place IVT consult once pt's discharge orders are written for VAST to come flush and de-access port prior to pt's discharge.

## 2018-03-30 NOTE — Progress Notes (Addendum)
Advanced Heart Failure Rounding Note  PCP-Cardiologist: No primary care provider on file.   Subjective:    Admitted with SOB, cough, and fevers. Started on PO doxy for ?URI coverage.  Lasix is on hold. Creatinine trending up 0.87 -> 1.02 -> 1.13. SBP 110s. Afebrile overnight. WBC normal. Weight down ?6 lbs. 6 beats NSVT overnight.   Denies CP or SOB. No cough. Had sweats overnight. Wants to go home.   Objective:   Weight Range: 73.8 kg Body mass index is 25.47 kg/m.   Vital Signs:   Temp:  [97.4 F (36.3 C)-97.7 F (36.5 C)] 97.5 F (36.4 C) (02/26 0356) Pulse Rate:  [70-84] 71 (02/26 0356) Resp:  [18-21] 18 (02/26 0356) BP: (110-120)/(58-74) 110/71 (02/26 0356) SpO2:  [91 %-96 %] 94 % (02/26 0356) Weight:  [73.8 kg-76.5 kg] 73.8 kg (02/26 0353) Last BM Date: 03/29/18  Weight change: Filed Weights   03/29/18 1141 03/30/18 0353  Weight: 76.5 kg 73.8 kg    Intake/Output:   Intake/Output Summary (Last 24 hours) at 03/30/2018 0838 Last data filed at 03/30/2018 0405 Gross per 24 hour  Intake 720 ml  Output 975 ml  Net -255 ml      Physical Exam    General:  Thin. Appears chronically ill. No resp difficulty HEENT: Normal Neck: Supple. JVP flat. Carotids 2+ bilat; no bruits. No lymphadenopathy or thyromegaly appreciated. Cor: PMI nondisplaced. Regular rate & rhythm. No rubs, gallops or murmurs. Right chest portacath.  Lungs: Clear Abdomen: Soft, nontender, nondistended. No hepatosplenomegaly. No bruits or masses. Good bowel sounds. Extremities: No cyanosis, clubbing, rash, edema Neuro: Alert & orientedx3, cranial nerves grossly intact. moves all 4 extremities w/o difficulty. Affect pleasant   Telemetry   NSR 80s with PVCs (0-1/min). 6 beats NSVT. Personally reviewed.   EKG    No new tracings.   Labs    CBC Recent Labs    03/27/18 0929 03/28/18 1616 03/29/18 0630 03/30/18 0423  WBC 5.9 8.0 6.7 7.0  NEUTROABS 3.9 5.2  --   --   HGB 12.6 14.4  13.4 14.1  HCT 39.8 45.8 42.6 43.9  MCV 98.0 95.6 95.7 95.2  PLT 141* 169 156 562   Basic Metabolic Panel Recent Labs    03/29/18 0630 03/29/18 1517 03/30/18 0423  NA 140  --  138  K 3.4*  --  4.2  CL 103  --  103  CO2 26  --  24  GLUCOSE 116*  --  127*  BUN 14  --  19  CREATININE 1.02*  --  1.13*  CALCIUM 9.6  --  10.5*  MG 1.5* 1.8 2.1  PHOS 3.6  --   --    Liver Function Tests Recent Labs    03/28/18 1616 03/29/18 0630  AST 69* 65*  ALT 40 37  ALKPHOS 105 95  BILITOT 1.1 1.5*  PROT 5.5* 5.5*  ALBUMIN 2.6* 2.4*   Recent Labs    03/28/18 1616  LIPASE 33   Cardiac Enzymes Recent Labs    03/28/18 2125 03/29/18 0630 03/29/18 1033  TROPONINI 0.05* 0.05* 0.05*    BNP: BNP (last 3 results) Recent Labs    03/27/18 0929 03/28/18 1616  BNP 257.4* 383.9*    ProBNP (last 3 results) No results for input(s): PROBNP in the last 8760 hours.   D-Dimer No results for input(s): DDIMER in the last 72 hours. Hemoglobin A1C Recent Labs    03/29/18 0630  HGBA1C 5.8*   Fasting  Lipid Panel No results for input(s): CHOL, HDL, LDLCALC, TRIG, CHOLHDL, LDLDIRECT in the last 72 hours. Thyroid Function Tests Recent Labs    03/29/18 0630  TSH 1.646    Other results:   Imaging     No results found.   Medications:     Scheduled Medications: . acyclovir  400 mg Oral TID  . albuterol  2.5 mg Nebulization BID  . amiodarone  200 mg Oral Daily  . bisoprolol  5 mg Oral Daily  . cholecalciferol  1,000 Units Oral Daily  . doxycycline  100 mg Oral Q12H  . enoxaparin (LOVENOX) injection  40 mg Subcutaneous Q24H  . feeding supplement (ENSURE ENLIVE)  237 mL Oral BID BM  . guaiFENesin  600 mg Oral BID  . insulin aspart  0-5 Units Subcutaneous QHS  . insulin aspart  0-9 Units Subcutaneous TID WC  . letrozole  2.5 mg Oral Daily  . losartan  25 mg Oral QHS  . magnesium oxide  400 mg Oral Daily  . mometasone-formoterol  1 puff Inhalation BID  . multivitamin  with minerals  1 tablet Oral Daily  . predniSONE  40 mg Oral Q breakfast  . senna  1 tablet Oral BID  . sertraline  50 mg Oral Daily  . sodium chloride flush  3 mL Intravenous Q12H  . spironolactone  12.5 mg Oral Daily  . triamcinolone  2 spray Nasal Daily  . vitamin B-12  500 mcg Oral Daily     Infusions: . sodium chloride 250 mL (03/29/18 0044)     PRN Medications:  sodium chloride, acetaminophen **OR** acetaminophen, albuterol, ALPRAZolam, bisacodyl, ipratropium, LORazepam, ondansetron **OR** ondansetron (ZOFRAN) IV, polyethylene glycol, sodium chloride flush, sodium chloride flush    Patient Profile   Hailey Barkalow Ellingtonis a 78 y.o.femalewith past medical history of chronic systolic HF due to NICM (EF 35%), HTN, HLD, DM2, and metastatic breast cancer to liver.  Presented to [facility] 2/24 with CP and SOB.   Assessment/Plan   1. SOB - In setting of chills, sweats, and fevers - Elevated lactic acid up to 4.3 -> 3.3 2/25 - I think symptoms are more likely due to infectious process.  - She is on oral doxy. Remains afebrile. WBC 7.0, but she is immunocompromised.   2. Breast Cancer with liver met by biopsy 11/12/17 - Last chemo 10/25, last Neulasta on 10/28 - Taxotere/Herceptin and Perjeta on hold currently with recent low LVEF. 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 Possibly exacerbated by Herceptin. Herceptin now on hold with drop in EF. No change.   3. ?Acute on chronic systolic and diastolic CHF due to NICM. No ICD. - Echo 11/24/17 LVEF 30-35% with diffuse HK and grade 1 DD + dyssynchrony - Cath  in 2017 showed normal coronaries.  - Holter 11/19 with 14% PVCs. Felt possibly contributing to cardiomyopathy but PVCs suppressed without improvement - Echo 03/18/18 EF 20-25% - Volume stable to dry. - Hold diuresis. She has lasix to take PRN at home, but has not taken.  - Continue bisoprolol 5 mg daily.  - Continue spiro 12.5 mg daily - Continue losartan 25 mg daily.  - Cardiac  MRI ordered as outpatient, but not yet done. Will order today.  - Planning to repeat echo 2 months to see if EF improves off of herceptin.   4. Frequent PVCs/NSVT - Holter 11/19 with 14% PVCs. Felt possibly contributing to cardiomyopathy but PVCs suppressed without improvement - 0-3 PVCs/min on tele. K and mag normal. - Continue amio 200 mg daily. AST up to 65 today (also has liver mets). ALT and TSH normal.  5. Hypomagnesium/Hypokalemia - Mag 2.1, K 4.2 today.  6. Chest pain - She had constant CP that started on Sunday throughout chest. Pain was more in epigastric area and tender with palpation yesterday.  Suspect noncardiac.  - Troponin 0.05 0> 0.05 >0.05 - EKG shows NSR with LBBB (QRS 121 ms). LBBB present on EKG in January, but was not on EKG in November.  - No further CP.   I think she is stable from HF standpoint. She would like to get cardiac MRI prior to leaving so she doesn't have to come back. I will make her follow up in case she leaves today.   Length of Stay: Sylvarena, NP  03/30/2018, 8:38 AM  Advanced Heart Failure Team Pager [phone] (M-F; 7a - 4p)  Please contact [facility] for night-coverage after hours (4p -7a ) and weekends on [email or web address]  Patient seen and examined with the above-signed Advanced Practice Provider and/or Housestaff. I personally reviewed laboratory data, imaging studies and relevant notes. I independently examined the patient and formulated the important aspects of the plan. I have edited the note to reflect any of my changes or salient points. I have  personally discussed the plan with the patient and/or family.  Febrile illness improving. HF/volume status looks good. cMRI done but not read yet. Drew for d/c today. Will f/u with her with results of MRI. Electrolytes ok. Continue current HF meds.   Glori Bickers, MD  3:08 PM

## 2018-03-30 NOTE — Discharge Summary (Signed)
Physician Discharge Summary  [name] KWI:[id number] DOB: 1940/03/27 DOA: 03/28/2018  PCP: Vicenta Aly, FNP  Admit date: 03/28/2018 Discharge date: 03/30/2018  Admitted From: Home Disposition:  Home  Recommendations for Outpatient Follow-up:  1. Follow up with PCP in 1 week 2. Follow up with [facility] on 04/08/2018 3. Please obtain BMP in 1 week, creatinine mildly increased from 0.79-->1.13 4. Follow-up on cardiac MRI results as an outpatient 5. 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  She had admitted to symptoms of shortness of breath, chest pain, dry cough.  She was given diuretic, Lasix, as well as steroids and doxycycline.  Respiratory viral panel was negative including influenza.  CTA chest was negative for PE, no acute cardiopulmonary disease found. Cardiology was consulted.  Her symptoms were felt to be secondary to bronchitis rather than heart failure.  Discharge Diagnoses:  Principal Problem:   Acute bronchitis Active Problems:   DM type 2 (diabetes mellitus, type 2) (HCC)   HYPERCHOLESTEROLEMIA   Essential hypertension   GERD   Primary cancer of lower-inner quadrant of left female breast ([place])   Dilated cardiomyopathy (HCC)   HLD (hyperlipidemia)   Chronic combined systolic and diastolic CHF (congestive heart failure) (HCC)   CHF NYHA class II, acute, systolic (HCC)    Malnutrition of moderate degree Frequent PVCs Metastatic breast cancer   Discharge Instructions  Discharge Instructions    (HEART FAILURE PATIENTS) Call MD:  Anytime you have any of the following symptoms: 1) 3 pound weight gain in 24 hours or 5 pounds in 1 week 2) shortness of breath, with or without a dry hacking cough 3) swelling in the hands, feet or stomach 4) if you have to sleep on extra pillows at night in order to breathe.   Complete by:  As directed    Call MD for:  difficulty breathing, headache or visual disturbances   Complete by:  As directed    Call MD for:  extreme fatigue   Complete by:  As directed    Call MD for:  persistant dizziness or light-headedness   Complete by:  As directed    Call MD for:  persistant nausea and vomiting   Complete by:  As directed    Call MD for:  severe uncontrolled pain   Complete by:  As directed    Call MD for:  temperature >100.4   Complete by:  As directed    Diet - low sodium heart healthy   Complete by:  As directed    Discharge instructions   Complete by:  As directed    You were cared for by a hospitalist during your hospital stay. If you have any questions about your discharge medications or the care you received while you were in the hospital after you are discharged, you can call the unit and ask to speak with the hospitalist on call if the hospitalist that took care of you is not available. Once you are discharged, your primary care physician will  handle any further medical issues. Please note that NO REFILLS for any discharge medications will be authorized once you are discharged, as it is imperative that you return to your primary care physician (or establish a relationship with a primary care physician if you do not have one) for your aftercare needs so that they can reassess your need for medications and monitor your lab values.   Increase activity slowly   Complete by:  As directed      Allergies as of 03/30/2018       Reactions   Cephalexin Hives   Patient indicates that she has used amoxicillin without hives or other allergic reaction.   Clindamycin Hives   Oxycodone-acetaminophen Hives, Itching   Rocephin [ceftriaxone Sodium In Dextrose] Hives   Sulfonamide Derivatives Hives   Dilaudid [hydromorphone Hcl] Itching      Medication List    STOP taking these medications   carvedilol 6.25 MG tablet Commonly known as:  COREG     TAKE these medications   acyclovir 200 MG capsule Commonly known as:  ZOVIRAX TAKE 2 CAPSULES BY MOUTH 3 TIMES DAILY What changed:  See the new instructions.   ALPRAZolam 0.5 MG tablet Commonly known as:  XANAX TAKE ONE-HALF TO ONE TABLET BY MOUTH THREE TIMES DAILY AS NEEDED FOR  NERVES What changed:    how much to take  how to take this  when to take this  reasons to take this  additional instructions   amiodarone 200 MG tablet Commonly known as:  PACERONE Take 1 tablet (200 mg total) by mouth daily.   amoxicillin 500 MG capsule Commonly known as:  AMOXIL Take four capsules one hour before dental appointment.   anastrozole 1 MG tablet Commonly known as:  ARIMIDEX Take 1 tablet (1 mg total) by mouth daily.   bisoprolol 5 MG tablet Commonly known as:  ZEBETA Take 1 tablet (5 mg total) by mouth daily. Start taking on:  March 31, 2018   Chlorpheniramine-DM 4-30 MG Tabs Commonly known as:  CORICIDIN COUGH/COLD Take as directed on the packaging   cholecalciferol 1000 units tablet Commonly known as:  VITAMIN D Take 1 tablet (1,000 Units total) by mouth daily.   doxycycline 100 MG tablet Commonly known as:  VIBRA-TABS Take 1 tablet (100 mg total) by mouth every 12 (twelve) hours for 3 days.   furosemide 20 MG tablet Commonly known as:  LASIX Take 1 tablet (20 mg total) by mouth as needed (for swelling).   Insulin Pen Needle 31G X 8 MM Misc Commonly known as:  RELION PEN NEEDLE 31G/8MM 1 Device by Does not apply route 4 (four) times daily.    ketoconazole 2 % cream Commonly known as:  NIZORAL Apply 1 application topically daily.   letrozole 2.5 MG tablet Commonly known as:  FEMARA Take 2.5 mg by mouth daily.   lidocaine-prilocaine cream Commonly known as:  EMLA Apply to affected area once   liraglutide 18 MG/3ML Sopn Commonly known as:  VICTOZA Inject 0.3 mLs (1.8 mg total) into the skin daily.   LORazepam 0.5 MG tablet Commonly known as:  ATIVAN Take 1 tablet (0.5 mg total) by mouth at bedtime as needed for sleep.   losartan 25 MG tablet Commonly known as:  COZAAR Take 1 tablet (25 mg total) by mouth at bedtime.   magnesium oxide 400 MG tablet Commonly known as:  MAG-OX Take 1 tablet (400 mg total) by mouth daily.   omeprazole 40 MG capsule Commonly known as:  PRILOSEC Take 40 mg by mouth daily.   ONE-A-DAY WOMENS 50+ ADVANTAGE Tabs Take 1 tablet by mouth daily.   potassium chloride SA 20 MEQ tablet Commonly known as:  K-DUR,KLOR-CON Take 1 tablet (20 mEq total) by mouth as needed (take with lasix as needed).   predniSONE 20 MG tablet Commonly known as:  DELTASONE Take 2 tablets (40 mg total) by mouth daily with breakfast for 2 days. Start taking on:  March 31, 2018   sertraline 50 MG tablet Commonly known as:  ZOLOFT Take 1 tablet (50 mg total) by mouth daily.   spironolactone 25 MG tablet Commonly known as:  ALDACTONE Take 0.5 tablets (12.5 mg total) by mouth daily. What changed:  when to take this   triamcinolone 55 MCG/ACT Aero nasal inhaler Commonly known as:  NASACORT Place 2 sprays into the nose daily. Use daily for five days   vitamin B-12 500 MCG tablet Commonly known as:  CYANOCOBALAMIN Take 1 tablet (500 mcg total) by mouth daily.      Follow-up Information    [facility] Follow up.   Specialty:  [place] Why:  They will do your home health care at your home Contact information: [place] [address] [phone]         [facility] Follow up on 04/08/2018.   Specialty:  Cardiology Why:  Heart Failure Follow up 04/08/18 @9  -Parking in ER lot (enter under blue awning to left of ER), or underneath [place] in the [facility] on [address] (garage code:[id number], elevator to 1st floor).  -Take all am meds and bring all med bottles  Contact information: [address] [id number] [place]       Vicenta Aly, [place]. Schedule an appointment as soon as possible for a visit in 1 week(s).   Specialty:  Nurse Practitioner Contact information: [place] [address] [phone]          Allergies  Allergen Reactions  . Cephalexin Hives    Patient indicates that she has used amoxicillin without hives or other allergic reaction.  . Clindamycin Hives  . Oxycodone-Acetaminophen Hives and Itching  . Rocephin [Ceftriaxone Sodium In Dextrose] Hives  . Sulfonamide Derivatives Hives  . Dilaudid [Hydromorphone Hcl] Itching    Consultations:  Cardiology    Procedures/Studies: Dg Chest 2 View  Result Date: 03/28/2018 CLINICAL DATA:  Shortness of breath since Saturday, worse today. EXAM: CHEST - 2 VIEW COMPARISON:  Chest x-rays dated 03/27/2018, 02/15/2018 and 12/03/2017. FINDINGS: Mild cardiomegaly. RIGHT chest wall Port-A-Cath in place with tip at the level of the mid SVC. Mild interstitial edema. Lungs otherwise clear. No confluent opacity to suggest a developing pneumonia. No pleural effusion or pneumothorax seen. Osseous structures about the chest are unremarkable. IMPRESSION: Mild cardiomegaly. Mild interstitial edema suggesting mild CHF/volume overload. No evidence of pneumonia or overt alveolar pulmonary edema. Electronically Signed   By: Franki Cabot M.D.   On: 03/28/2018 16:57   Dg Chest 2 View  Result Date: 03/27/2018 CLINICAL DATA:  Cough, congestion, and shortness of  breath. History of metastatic breast cancer. EXAM: CHEST - 2 VIEW COMPARISON:  Chest x-ray dated February 15, 2018. FINDINGS: Unchanged right chest wall port catheter. The heart size and mediastinal contours are within normal limits. Both lungs are clear. The visualized skeletal structures are unremarkable. IMPRESSION: No active cardiopulmonary disease. Electronically Signed  By: Titus Dubin M.D.   On: 03/27/2018 10:27   Ct Angio Chest Pe W And/or Wo Contrast  Result Date: 03/28/2018 CLINICAL DATA:  Productive cough EXAM: CT ANGIOGRAPHY CHEST WITH CONTRAST TECHNIQUE: Multidetector CT imaging of the chest was performed using the standard protocol during bolus administration of intravenous contrast. Multiplanar CT image reconstructions and MIPs were obtained to evaluate the vascular anatomy. CONTRAST:  70mL ISOVUE-370 IOPAMIDOL (ISOVUE-370) INJECTION 76% COMPARISON:  03/11/2018 FINDINGS: Cardiovascular: Right Port-A-Cath remains in place, unchanged. No filling defects in the pulmonary arteries to suggest pulmonary emboli. Heart is upper limits normal in size. Aorta is normal caliber. Mediastinum/Nodes: No mediastinal, hilar, or axillary adenopathy. Lungs/Pleura: Small left pleural effusion. No confluent airspace opacities or effusions. Upper Abdomen: Low-density areas throughout the liver compatible with diffuse hepatic metastases as better seen on recent study. Musculoskeletal: Changes of left mastectomy and lymph node dissection. Stable T9 lucent lesion dating back to 2013 compatible with benign lesion, likely hemangioma. No acute bony abnormality. Review of the MIP images confirms the above findings. IMPRESSION: No evidence of pulmonary embolus. Borderline heart size. Small left pleural effusion. No acute cardiopulmonary disease. Diffuse a patent metastases again noted, stable since recent study. Electronically Signed   By: Rolm Baptise M.D.   On: 03/28/2018 18:07    Discharge Exam: Vitals:   03/30/18  1058 03/30/18 1213  BP:  105/75  Pulse:  73  Resp:  18  Temp:  97.6 F (36.4 C)  SpO2: 97% 96%    General: Pt is alert, awake, not in acute distress Cardiovascular: RRR, S1/S2 +, no rubs, no gallops Respiratory: CTA bilaterally, no wheezing, no rhonchi Abdominal: Soft, NT, ND, bowel sounds + Extremities: no edema, no cyanosis    The results of significant diagnostics from this hospitalization (including imaging, microbiology, ancillary and laboratory) are listed below for reference.     Microbiology: Recent Results (from the past 240 hour(s))  Respiratory Panel by PCR     Status: None   Collection Time: 03/28/18  5:09 PM  Result Value Ref Range Status   Adenovirus NOT DETECTED NOT DETECTED Final   Coronavirus 229E NOT DETECTED NOT DETECTED Final    Comment: (NOTE) The Coronavirus on the Respiratory Panel, DOES NOT test for the novel  Coronavirus (2019 nCoV)    Coronavirus HKU1 NOT DETECTED NOT DETECTED Final   Coronavirus NL63 NOT DETECTED NOT DETECTED Final   Coronavirus OC43 NOT DETECTED NOT DETECTED Final   Metapneumovirus NOT DETECTED NOT DETECTED Final   Rhinovirus / Enterovirus NOT DETECTED NOT DETECTED Final   Influenza A NOT DETECTED NOT DETECTED Final   Influenza B NOT DETECTED NOT DETECTED Final   Parainfluenza Virus 1 NOT DETECTED NOT DETECTED Final   Parainfluenza Virus 2 NOT DETECTED NOT DETECTED Final   Parainfluenza Virus 3 NOT DETECTED NOT DETECTED Final   Parainfluenza Virus 4 NOT DETECTED NOT DETECTED Final   Respiratory Syncytial Virus NOT DETECTED NOT DETECTED Final   Bordetella pertussis NOT DETECTED NOT DETECTED Final   Chlamydophila pneumoniae NOT DETECTED NOT DETECTED Final   Mycoplasma pneumoniae NOT DETECTED NOT DETECTED Final    Comment: Performed at [facility], [address], [place], [place] [address]  Blood culture (routine x 2)     Status: None (Preliminary result)   Collection Time: 03/28/18  5:18 PM  Result Value Ref Range  Status   Specimen Description BLOOD RIGHT ANTECUBITAL  Final   Special Requests   Final    BOTTLES  DRAWN AEROBIC AND ANAEROBIC Blood Culture adequate volume   Culture   Final    NO GROWTH 2 DAYS Performed at [facility], [place] [address], [place], [place] [address]    Report Status PENDING  Incomplete  Blood culture (routine x 2)     Status: None (Preliminary result)   Collection Time: 03/28/18  5:18 PM  Result Value Ref Range Status   Specimen Description BLOOD RIGHT HAND  Final   Special Requests   Final    BOTTLES DRAWN AEROBIC AND ANAEROBIC Blood Culture adequate volume   Culture   Final    NO GROWTH 2 DAYS Performed at [facility], [place] [address], [place], [place] [address]    Report Status PENDING  Incomplete  Urine culture     Status: Abnormal   Collection Time: 03/28/18 10:14 PM  Result Value Ref Range Status   Specimen Description URINE, RANDOM  Final   Special Requests   Final    NONE Performed at [facility], [place] [address], [place], [place] [address]    Culture MULTIPLE SPECIES PRESENT, SUGGEST RECOLLECTION (A)  Final   Report Status 03/30/2018 FINAL  Final     Labs: BNP (last 3 results) Recent Labs    03/27/18 0929 03/28/18 1616  BNP 257.4* 641.5*   Basic Metabolic Panel: Recent Labs  Lab 03/27/18 0929 03/28/18 1616 03/29/18 0630 03/29/18 1517 03/30/18 0423  NA 139 137 140  --  138  K 3.7 3.8 3.4*  --  4.2  CL 107 104 103  --  103  CO2 25 25 26   --  24  GLUCOSE 167* 148* 116*  --  127*  BUN 15 12 14   --  19  CREATININE 0.79 0.87 1.02*  --  1.13*  CALCIUM 9.1 9.5 9.6  --  10.5*  MG  --   --  1.5* 1.8 2.1  PHOS  --   --  3.6  --   --    Liver Function Tests: Recent Labs  Lab 03/27/18 0929 03/28/18 1616 03/29/18 0630  AST 70* 69* 65*  ALT 41 40 37  ALKPHOS 131* 105 95  BILITOT 1.1 1.1 1.5*  PROT 5.7* 5.5* 5.5*  ALBUMIN 2.8* 2.6* 2.4*   Recent Labs  Lab 03/28/18 1616  LIPASE 33   No results for input(s): AMMONIA  in the last 168 hours. CBC: Recent Labs  Lab 03/27/18 0929 03/28/18 1616 03/29/18 0630 03/30/18 0423  WBC 5.9 8.0 6.7 7.0  NEUTROABS 3.9 5.2  --   --   HGB 12.6 14.4 13.4 14.1  HCT 39.8 45.8 42.6 43.9  MCV 98.0 95.6 95.7 95.2  PLT 141* 169 156 170   Cardiac Enzymes: Recent Labs  Lab 03/28/18 2125 03/29/18 0630 03/29/18 1033  TROPONINI 0.05* 0.05* 0.05*   BNP: Invalid input(s): POCBNP CBG: Recent Labs  Lab 03/29/18 0639 03/29/18 1231 03/29/18 1616 03/29/18 2113 03/30/18 0611  GLUCAP 113* 148* 233* 234* 115*   D-Dimer No results for input(s): DDIMER in the last 72 hours. Hgb A1c Recent Labs    03/29/18 0630  HGBA1C 5.8*   Lipid Profile No results for input(s): CHOL, HDL, LDLCALC, TRIG, CHOLHDL, LDLDIRECT in the last 72 hours. Thyroid function studies Recent Labs    03/29/18 0630  TSH 1.646   Anemia work up No results for input(s): VITAMINB12, FOLATE, FERRITIN, TIBC, IRON, RETICCTPCT in the last 72 hours. Urinalysis    Component Value Date/Time  COLORURINE YELLOW 03/28/2018 Anton Ruiz 03/28/2018 2215   LABSPEC 1.013 03/28/2018 2215   PHURINE 5.0 03/28/2018 2215   GLUCOSEU NEGATIVE 03/28/2018 2215   GLUCOSEU NEGATIVE 03/05/2010 1006   HGBUR NEGATIVE 03/28/2018 2215   BILIRUBINUR NEGATIVE 03/28/2018 2215   KETONESUR NEGATIVE 03/28/2018 2215   PROTEINUR NEGATIVE 03/28/2018 2215   UROBILINOGEN 0.2 06/25/2011 2040   NITRITE NEGATIVE 03/28/2018 2215   LEUKOCYTESUR SMALL (A) 03/28/2018 2215   Sepsis Labs Invalid input(s): PROCALCITONIN,  WBC,  LACTICIDVEN Microbiology Recent Results (from the past 240 hour(s))  Respiratory Panel by PCR     Status: None   Collection Time: 03/28/18  5:09 PM  Result Value Ref Range Status   Adenovirus NOT DETECTED NOT DETECTED Final   Coronavirus 229E NOT DETECTED NOT DETECTED Final    Comment: (NOTE) The Coronavirus on the Respiratory Panel, DOES NOT test for the novel  Coronavirus (2019 nCoV)     Coronavirus HKU1 NOT DETECTED NOT DETECTED Final   Coronavirus NL63 NOT DETECTED NOT DETECTED Final   Coronavirus OC43 NOT DETECTED NOT DETECTED Final   Metapneumovirus NOT DETECTED NOT DETECTED Final   Rhinovirus / Enterovirus NOT DETECTED NOT DETECTED Final   Influenza A NOT DETECTED NOT DETECTED Final   Influenza B NOT DETECTED NOT DETECTED Final   Parainfluenza Virus 1 NOT DETECTED NOT DETECTED Final   Parainfluenza Virus 2 NOT DETECTED NOT DETECTED Final   Parainfluenza Virus 3 NOT DETECTED NOT DETECTED Final   Parainfluenza Virus 4 NOT DETECTED NOT DETECTED Final   Respiratory Syncytial Virus NOT DETECTED NOT DETECTED Final   Bordetella pertussis NOT DETECTED NOT DETECTED Final   Chlamydophila pneumoniae NOT DETECTED NOT DETECTED Final   Mycoplasma pneumoniae NOT DETECTED NOT DETECTED Final    Comment: Performed at [facility], [place]. [address] [place], [place] [address]  Blood culture (routine x 2)     Status: None (Preliminary result)   Collection Time: 03/28/18  5:18 PM  Result Value Ref Range Status   Specimen Description BLOOD RIGHT ANTECUBITAL  Final   Special Requests   Final    BOTTLES DRAWN AEROBIC AND ANAEROBIC Blood Culture adequate volume   Culture   Final    NO GROWTH 2 DAYS Performed at [facility], [place] [address] [place], [place] [address]    Report Status PENDING  Incomplete  Blood culture (routine x 2)     Status: None (Preliminary result)   Collection Time: 03/28/18  5:18 PM  Result Value Ref Range Status   Specimen Description BLOOD RIGHT HAND  Final   Special Requests   Final    BOTTLES DRAWN AEROBIC AND ANAEROBIC Blood Culture adequate volume   Culture   Final    NO GROWTH 2 DAYS Performed at [facility], [place] [address], [place], [place] [address]    Report Status PENDING  Incomplete  Urine culture     Status: Abnormal   Collection Time: 03/28/18 10:14 PM  Result Value Ref Range Status   Specimen Description URINE, RANDOM  Final    Special Requests   Final    NONE Performed at [facility], [place] [address], [place], [place] [address]    Culture MULTIPLE SPECIES PRESENT, SUGGEST RECOLLECTION (A)  Final   Report Status 03/30/2018 FINAL  Final     Patient was seen and examined on the day of discharge and was found to be in stable condition. Time coordinating discharge: 40 minutes  including assessment and coordination of care, as well as examination of the patient.   SIGNED:  Dessa Phi, DO [facility] [email or web address] 03/30/2018, 2:10 PM

## 2018-03-30 NOTE — Progress Notes (Signed)
PT Cancellation Note  Patient Details Name: [name] MRN: [id number] DOB: Jan 11, 1941   Cancelled Treatment:    Reason Eval/Treat Not Completed: Other (comment). Pt's pastor arrived at beginning of session; 20 minutes later, they are still visiting. Will follow-up for PT treatment as schedule permits.  Mabeline Caras, PT, DPT Acute Rehabilitation Services  Pager [phone] Office [place] 03/30/2018, 9:16 AM

## 2018-03-31 ENCOUNTER — Ambulatory Visit: Payer: Medicare HMO | Admitting: Cardiology

## 2018-03-31 LAB — GLUCOSE, CAPILLARY: Glucose-Capillary: 128 mg/dL — ABNORMAL HIGH (ref 70–99)

## 2018-04-02 LAB — CULTURE, BLOOD (ROUTINE X 2)
Culture: NO GROWTH
Culture: NO GROWTH
Special Requests: ADEQUATE
Special Requests: ADEQUATE

## 2018-04-08 ENCOUNTER — Other Ambulatory Visit: Payer: Self-pay

## 2018-04-08 ENCOUNTER — Ambulatory Visit (HOSPITAL_COMMUNITY)
Admission: RE | Admit: 2018-04-08 | Discharge: 2018-04-08 | Disposition: A | Discharge status: Home / Self Care | Payer: Medicare HMO | Source: Ambulatory Visit | Attending: Cardiology | Admitting: Cardiology

## 2018-04-08 ENCOUNTER — Encounter (HOSPITAL_COMMUNITY): Payer: Self-pay

## 2018-04-08 VITALS — BP 128/78 | HR 54 | Wt 156.6 lb

## 2018-04-08 DIAGNOSIS — Z9012 Acquired absence of left breast and nipple: Secondary | ICD-10-CM | POA: Diagnosis not present

## 2018-04-08 DIAGNOSIS — Z881 Allergy status to other antibiotic agents status: Secondary | ICD-10-CM | POA: Diagnosis not present

## 2018-04-08 DIAGNOSIS — Z9221 Personal history of antineoplastic chemotherapy: Secondary | ICD-10-CM | POA: Diagnosis not present

## 2018-04-08 DIAGNOSIS — I420 Dilated cardiomyopathy: Secondary | ICD-10-CM | POA: Diagnosis not present

## 2018-04-08 DIAGNOSIS — C787 Secondary malignant neoplasm of liver and intrahepatic bile duct: Secondary | ICD-10-CM | POA: Insufficient documentation

## 2018-04-08 DIAGNOSIS — Z7722 Contact with and (suspected) exposure to environmental tobacco smoke (acute) (chronic): Secondary | ICD-10-CM | POA: Insufficient documentation

## 2018-04-08 DIAGNOSIS — F419 Anxiety disorder, unspecified: Secondary | ICD-10-CM | POA: Insufficient documentation

## 2018-04-08 DIAGNOSIS — Z170 Estrogen receptor positive status [ER+]: Secondary | ICD-10-CM | POA: Insufficient documentation

## 2018-04-08 DIAGNOSIS — C50911 Malignant neoplasm of unspecified site of right female breast: Secondary | ICD-10-CM | POA: Insufficient documentation

## 2018-04-08 DIAGNOSIS — Z8249 Family history of ischemic heart disease and other diseases of the circulatory system: Secondary | ICD-10-CM | POA: Diagnosis not present

## 2018-04-08 DIAGNOSIS — Z794 Long term (current) use of insulin: Secondary | ICD-10-CM | POA: Insufficient documentation

## 2018-04-08 DIAGNOSIS — E1140 Type 2 diabetes mellitus with diabetic neuropathy, unspecified: Secondary | ICD-10-CM | POA: Insufficient documentation

## 2018-04-08 DIAGNOSIS — K219 Gastro-esophageal reflux disease without esophagitis: Secondary | ICD-10-CM | POA: Diagnosis not present

## 2018-04-08 DIAGNOSIS — Z8042 Family history of malignant neoplasm of prostate: Secondary | ICD-10-CM | POA: Insufficient documentation

## 2018-04-08 DIAGNOSIS — Z792 Long term (current) use of antibiotics: Secondary | ICD-10-CM | POA: Diagnosis not present

## 2018-04-08 DIAGNOSIS — I493 Ventricular premature depolarization: Secondary | ICD-10-CM | POA: Diagnosis not present

## 2018-04-08 DIAGNOSIS — F329 Major depressive disorder, single episode, unspecified: Secondary | ICD-10-CM | POA: Insufficient documentation

## 2018-04-08 DIAGNOSIS — E669 Obesity, unspecified: Secondary | ICD-10-CM | POA: Diagnosis not present

## 2018-04-08 DIAGNOSIS — Z79811 Long term (current) use of aromatase inhibitors: Secondary | ICD-10-CM | POA: Diagnosis not present

## 2018-04-08 DIAGNOSIS — Z885 Allergy status to narcotic agent status: Secondary | ICD-10-CM | POA: Insufficient documentation

## 2018-04-08 DIAGNOSIS — C50919 Malignant neoplasm of unspecified site of unspecified female breast: Secondary | ICD-10-CM | POA: Diagnosis present

## 2018-04-08 DIAGNOSIS — I5042 Chronic combined systolic (congestive) and diastolic (congestive) heart failure: Secondary | ICD-10-CM | POA: Insufficient documentation

## 2018-04-08 DIAGNOSIS — C50312 Malignant neoplasm of lower-inner quadrant of left female breast: Secondary | ICD-10-CM

## 2018-04-08 DIAGNOSIS — Z886 Allergy status to analgesic agent status: Secondary | ICD-10-CM | POA: Diagnosis not present

## 2018-04-08 DIAGNOSIS — Z833 Family history of diabetes mellitus: Secondary | ICD-10-CM | POA: Insufficient documentation

## 2018-04-08 DIAGNOSIS — Z808 Family history of malignant neoplasm of other organs or systems: Secondary | ICD-10-CM | POA: Insufficient documentation

## 2018-04-08 DIAGNOSIS — Z818 Family history of other mental and behavioral disorders: Secondary | ICD-10-CM | POA: Insufficient documentation

## 2018-04-08 DIAGNOSIS — Z79899 Other long term (current) drug therapy: Secondary | ICD-10-CM | POA: Diagnosis not present

## 2018-04-08 DIAGNOSIS — I110 Hypertensive heart disease with heart failure: Secondary | ICD-10-CM | POA: Diagnosis not present

## 2018-04-08 DIAGNOSIS — Z882 Allergy status to sulfonamides status: Secondary | ICD-10-CM | POA: Diagnosis not present

## 2018-04-08 LAB — BASIC METABOLIC PANEL
Anion gap: 7 (ref 5–15)
BUN: 13 mg/dL (ref 8–23)
CO2: 24 mmol/L (ref 22–32)
Calcium: 11.2 mg/dL — ABNORMAL HIGH (ref 8.9–10.3)
Chloride: 103 mmol/L (ref 98–111)
Creatinine, Ser: 1.11 mg/dL — ABNORMAL HIGH (ref 0.44–1.00)
GFR calc Af Amer: 55 mL/min — ABNORMAL LOW (ref >60)
GFR calc non Af Amer: 48 mL/min — ABNORMAL LOW (ref >60)
Glucose, Bld: 118 mg/dL — ABNORMAL HIGH (ref 70–99)
Potassium: 3.9 mmol/L (ref 3.5–5.1)
SODIUM: 134 mmol/L — AB (ref 135–145)

## 2018-04-08 MED ORDER — SPIRONOLACTONE 25 MG PO TABS: 25.0000 mg | ORAL_TABLET | Freq: Every day | ORAL | 6 refills | Status: AC

## 2018-04-08 NOTE — Patient Instructions (Signed)
INCREASE Spironolactone to 25 mg, one tab daily  Labs today We will only contact you if something comes back abnormal or we need to make some changes. Otherwise no news is good news!  Labs needed in 7-14 days  Keep follow up as scheduled with Dr Haroldine Laws

## 2018-04-08 NOTE — Progress Notes (Signed)
Cardio-Oncology Clinic Note   Referring Physician: Dr. Lindi Adie Primary Care: Vicenta Aly, FNP Primary Cardiologist: Dr. Caffie Damme  HPI:  [name] is a 78 y.o. female with past medical history of chronic systolic HF due to NICM (EF 35%), HTN, HLD, DM2, and metastatic breast cancer to liver who has been referred by Dr. Lindi Adie to establish in the cardio-oncology clinic for monitoring of cardio-toxicity while undergoing chemotherapy.    Primary cancer of lower-inner quadrant of left female breast ([place])   02/20/2011 Initial Diagnosis    Left Breast invasive lobular carcinoma; 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 Her clinical staging was stage IIB (T3, N0, M0).      03/23/2011 Surgery    L mastectomy + Axillary LND 6 cm ILC with LVI, PNI; 9/16 LN positive with Extracapsular ext ER 100%; PR 100%; Her 2 1.31; Ki 67: 18; pT3a N2a (stage IIIA)    04/17/2011 - 06/19/2011 Chemotherapy    FEC X 4 complicated by febrile neutropenis, spesis and chest wall abscess    10/07/2011 - 11/24/2011 Radiation Therapy    Left chest wall and axilla 5040 cGy in 28 # with Boost    10/16/2011 -  Anti-estrogen oral therapy    Letrozole 2.5 mg po daily    05/27/2015 - 05/30/2015 Hospital Admission    Acute systolic CHF, dilated cardiomyopathy, EF 35-40%    08/09/2015 Surgery    Right lumpectomy: Fibrocystic changes with calcifications, right nipple lesion: Seborrheic keratosis no evidence of malignancy    10/20/2017 Imaging    Elevated LFTs letter ultrasound of the liver which showed liver metastases    11/04/2017 PET scan    Heavy burden of metastatic disease to the liver involving all lobes, index 7.7 centimeters right hepatic lobe mass SUV 16.6, small but hypermetabolic left supraclavicular, left subpectoral, portacaval, aortocaval adenopathy    11/12/2017 Pathology Results    Liver biopsy: Metastatic breast cancer, ER  100%, PR 0%, Ki-67 10%, HER-2 3+ positive    11/19/2017 -  Chemotherapy    The patient had pegfilgrastim-cbqv (UDENYCA) injection 6 mg, 6 mg, Subcutaneous, Once, 1 of 6 cycles Administration: 6 mg (11/29/2017) trastuzumab (HERCEPTIN) 609 mg in sodium chloride 0.9 % 250 mL chemo infusion, 8 mg/kg = 609 mg, Intravenous,  Once, 1 of 6 cycles DOCEtaxel (TAXOTERE) 140 mg in sodium chloride 0.9 % 250 mL chemo infusion, 75 mg/m2 = 140 mg, Intravenous,  Once, 1 of 6 cycles Administration: 140 mg (11/26/2017) pertuzumab (PERJETA) 840 mg in sodium chloride 0.9 % 250 mL chemo infusion, 840 mg, Intravenous, Once, 1 of 6 cycles  for chemotherapy treatment.     Treated in 4/13 for left breast CA with FEC (including epirubicin)  Had elevated LFTs and found to have extensive liver mets + chest nodes.   EF 2017 35-40% Cath in 05/2015 with no CAD.  Echo 11/24/17 LVEF 30-35%, Grade 1 DD, Mild PI, significant dyssynchrony.   PET CT scan 11/04/2017: Heavy burden of metastatic disease to the liver involving all lobes, largest 7.7 x 5.4 cm with SUV of 16.6, additional hypermetabolic left supraclavicular, left subpectoral, portacaval and aortocaval lymphadenopathy  US Biopsy 11/12/17 positive for HER2 (3) tumor cells  In 11/19 Holter montior placed for frequent PVCs. Found to have 14% burden. Amio 200 daily started 01/02/18  Admitted 2/24 - 2/26 with febrile illness, URI. Treated with doxy. Had cMRI as below.   She presents today for post hospital follow up. She is feeling much better. Still has slight  cough, but not productive. Denies fevers or chills. Sees Dr. Lindi Adie again next week. Denies lightheadedness or dizziness. No SOB with ADLs. Appetite and energy level are sub-par. She states her appetite is mostly affected by poor taste. She is taking all medication as directed.   cMRI 03/30/18 1.  Mildly dilated left ventricle with EF 25%, diffuse hypokinesis. 2.  Normal RV size and systolic function, EF  56%. 3. Nonspecific basal anteroseptal RV insertion site LGE, this can be due to pressure/volume overload.  Past Medical History:  Diagnosis Date  . ALLERGIC RHINITIS 12/13/2007  . ANXIETY 06/04/2008  . Blood transfusion 1964   post childbirth  . 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 Chronic combined systolic (congestive) and diastolic (congestive) heart failure ([place])   . DEGENERATIVE JOINT DISEASE 12/17/2006  . DEPRESSION 08/10/2007  . Dilated cardiomyopathy ([place]) 05/29/2015   a. Dx 05/2015 - EF 35-40% - felt secondary to XRT and chemo for breast CA. Normal coronaries by cath.  . DM type 2 (diabetes mellitus, type 2) ([place])   . Dyspnea    sometimes when walking , sometimes when sitting  . Essential hypertension   . GERD 12/17/2006  . History of blood transfusion    after child birth  . History of chemotherapy   . History of hiatal hernia   . History of radiation therapy 10/07/11-11/24/11   left breast,5040 cGy/28 sessions,l chest wall boost=1000cGy/5 sesions  . 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 Hyperlipidemia   . 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 OBESITY 06/04/2008  . 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 Pneumonia   . Premature atrial contractions   . Sinus bradycardia    a. 2018 event monitor which showed NSR with average HR 71, frequent PACs, occasional PVCs, and sinus bradycardia as low as 50.    Current Outpatient Medications  Medication Sig Dispense Refill  . acyclovir (ZOVIRAX) 200 MG capsule TAKE 2 CAPSULES BY MOUTH 3 TIMES DAILY (Patient taking differently: Take 400 mg by mouth 3 (three) times daily. ) 540 capsule 3  . ALPRAZolam (XANAX) 0.5 MG tablet TAKE ONE-HALF TO ONE TABLET BY MOUTH THREE TIMES DAILY AS NEEDED FOR  NERVES (Patient taking differently: Take 0.5 mg by mouth 3 (three) times daily as needed for anxiety. ) 270 tablet 1  . amiodarone (PACERONE) 200 MG tablet Take 1 tablet (200 mg total) by mouth daily. 30 tablet 3  .  amoxicillin (AMOXIL) 500 MG capsule Take four capsules one hour before dental appointment. 4 capsule 1  . anastrozole (ARIMIDEX) 1 MG tablet Take 1 tablet (1 mg total) by mouth daily. 30 tablet 2  . bisoprolol (ZEBETA) 5 MG tablet Take 1 tablet (5 mg total) by mouth daily. 30 tablet 0  . Chlorpheniramine-DM (CORICIDIN COUGH/COLD) 4-30 MG TABS Take as directed on the packaging 28 each 0  . cholecalciferol (VITAMIN D) 1000 UNITS tablet Take 1 tablet (1,000 Units total) by mouth daily. 90 tablet 3  . cyanocobalamin 500 MCG tablet Take 1 tablet (500 mcg total) by mouth daily. 90 tablet 3  . furosemide (LASIX) 20 MG tablet Take 1 tablet (20 mg total) by mouth as needed (for swelling). 30 tablet 6  . Insulin Pen Needle (RELION PEN NEEDLE 31G/8MM) 31G X 8 MM MISC 1 Device by Does not apply route 4 (four) times daily. 120 each 11  . ketoconazole (NIZORAL) 2 % cream Apply 1 application  topically daily. 15 g 0  . letrozole (FEMARA) 2.5 MG tablet Take 2.5 mg by mouth daily.     Marland Kitchen lidocaine-prilocaine (EMLA) cream Apply to affected area once (Patient not taking: Reported on 03/27/2018) 30 g 3  . liraglutide (VICTOZA) 18 MG/3ML SOPN Inject 0.3 mLs (1.8 mg total) into the skin daily.    Marland Kitchen LORazepam (ATIVAN) 0.5 MG tablet Take 1 tablet (0.5 mg total) by mouth at bedtime as needed for sleep. 30 tablet 0  . losartan (COZAAR) 25 MG tablet Take 1 tablet (25 mg total) by mouth at bedtime. 30 tablet 6  . magnesium oxide (MAG-OX) 400 MG tablet Take 1 tablet (400 mg total) by mouth daily. 60 tablet 3  . Multiple Vitamins-Minerals (ONE-A-DAY WOMENS 50+ ADVANTAGE) TABS Take 1 tablet by mouth daily. 90 tablet 3  . omeprazole (PRILOSEC) 40 MG capsule Take 40 mg by mouth daily.    . potassium chloride SA (K-DUR,KLOR-CON) 20 MEQ tablet Take 1 tablet (20 mEq total) by mouth as needed (take with lasix as needed). 30 tablet 3  . sertraline (ZOLOFT) 50 MG tablet Take 1 tablet (50 mg total) by mouth daily. 90 tablet 3  .  spironolactone (ALDACTONE) 25 MG tablet Take 0.5 tablets (12.5 mg total) by mouth daily. 30 tablet 6  . triamcinolone (NASACORT) 55 MCG/ACT AERO nasal inhaler Place 2 sprays into the nose daily. Use daily for five days 1 Inhaler 0   No current facility-administered medications for this encounter.     Allergies  Allergen Reactions  . Cephalexin Hives    Patient indicates that she has used amoxicillin without hives or other allergic reaction.  . Clindamycin Hives  . Oxycodone-Acetaminophen Hives and Itching  . Rocephin [Ceftriaxone Sodium In Dextrose] Hives  . Sulfonamide Derivatives Hives  . Dilaudid [Hydromorphone Hcl] Itching      Social History   Socioeconomic History  . Marital status: Married    Spouse name: Not on file  . Number of children: 3  . Years of education: Not on file  . Highest education level: Not on file  Occupational History    Employer: RETIRED  Social Needs  . Financial resource strain: Not on file  . Food insecurity:    Worry: Not on file    Inability: Not on file  . Transportation needs:    Medical: Not on file    Non-medical: Not on file  Tobacco Use  . Smoking status: Never Smoker  . Smokeless tobacco: Never Used  Substance and Sexual Activity  . Alcohol use: No  . Drug use: No  . Sexual activity: Yes  Lifestyle  . Physical activity:    Days per week: Not on file    Minutes per session: Not on file  . Stress: Not on file  Relationships  . Social connections:    Talks on phone: Not on file    Gets together: Not on file    Attends religious service: Not on file    Active member of club or organization: Not on file    Attends meetings of clubs or organizations: Not on file    Relationship status: Not on file  . Intimate partner violence:    Fear of current or ex partner: Not on file    Emotionally abused: Not on file    Physically abused: Not on file    Forced sexual activity: Not on file  Other Topics Concern  . 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 COPD Mother   . Arthritis Mother   . Emphysema Mother   . Diabetes Brother   . Cancer Brother        prostate  . Cancer Father        malignant brain tumor  . Mental illness Paternal Grandfather   . Heart disease Other        Sibling  . Diabetes Other        Sibling    Vitals:   04/08/18 0851  BP: 128/78  Pulse: (!) 54  SpO2: 98%  Weight: 71 kg (156 lb 9.6 oz)   Wt Readings from Last 3 Encounters:  04/08/18 71 kg (156 lb 9.6 oz)  03/30/18 73.8 kg (162 lb 9.6 oz)  03/18/18 75.6 kg (166 lb 9.6 oz)    PHYSICAL EXAM: General: Fatigued appearing. No resp difficulty. HEENT: Normal Neck: Supple. JVP 6-7 cm. Carotids 2+ bilat; no bruits. No thyromegaly or nodule noted. Cor: PMI nondisplaced. RRR, No M/G/R noted Lungs: CTAB, normal effort. Abdomen: Soft, non-tender, non-distended, no HSM. No bruits or masses. +BS  Extremities: No cyanosis, clubbing, or rash. R and LLE no edema.  Neuro: Alert & orientedx3, cranial nerves grossly intact. moves all 4 extremities w/o difficulty. Affect pleasant   ASSESSMENT & PLAN:  1. Breast Cancer with liver met by biopsy 11/12/17 - Last chemo 10/25, last Neulasta on 10/28 - Taxotere/Herceptin and Perjeta on hold currently with recent low LVEF. Per Dr. Haroldine Laws, would recommend starting.   - PET CT scan 11/04/2017: Heavy burden of metastatic disease to the liver involving all lobes, largest 7.7 x 5.4 cm with SUV of 16.6, additional hypermetabolic left supraclavicular, left subpectoral, portacaval and aortocaval lymphadenopathy - She has restarted chemo 10/19 - Now s/p 4 rounds of Herceptin/perjeta and taxotere - Echo 2/14 reviewed personally EF 20-25% - Suspect NICM due to anthracycline toxicity with h/o  treatment with Epirubicin. Possibly exacerbated by Herceptin - Holding Herceptin for now.  - cMRI 03/30/18 with LVEF 25%, diffuse HK, normal RV, and Nonspecific basal anteroseptal RV insertion site LGE, this can be due to pressure/volume overload.  2. Chronic systolic and diastolic CHF due to NICM - Echo 11/24/17 LVEF 30-35% with diffuse HK and grade 1 DD + dyssynchrony - Cath in 2017 showed normal coronaries.  - Holter 11/19 with 14% PVCs. Felt possibly contributing to cardiomyopathy but PVCs suppressed without improvement - Echo 03/28/18 EF 20-25% - NYHA II-III symptoms post URI.  - Volume status OK on exam.   - Continue coreg 6.25 mg BID - Increase spironolactone to 25 mg daily.  - Continue losartan 25 mg daily - Will give her lasix to use prn for volume overload - Holding Herceptin - Repeat echo next month.   3. Frequent PVCs - Holter 11/19 with 14% PVCs. Felt possibly contributing to cardiomyopathy but PVCs suppressed without improvement - Continue amio for now  Improving post URI. Increase spiro. Labs today and 10 days. Keep appt for 6 weeks with Echo.   Shirley Friar, PA-C  8:49 AM  Greater than 50% of the 25 minute visit was spent in counseling/coordination of care regarding disease state education, salt/fluid restriction, sliding scale diuretics, and medication compliance.

## 2018-04-13 NOTE — Progress Notes (Signed)
Patient Care Team: Vicenta Aly, FNP as PCP - General (Nurse Practitioner) Neldon Mc, MD as Surgeon (General Surgery) Sueanne Margarita, MD as Consulting Physician (Cardiology)  DIAGNOSIS:    ICD-10-CM   1. Primary cancer of lower-inner quadrant of left female breast ([place]) C50.312     SUMMARY OF ONCOLOGIC HISTORY:   Primary cancer of lower-inner quadrant of left female breast ([place])   02/20/2011 Initial Diagnosis    Left Breast invasive lobular carcinoma; 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 Her clinical staging was stage IIB (T3, N0, M0).      03/23/2011 Surgery    L mastectomy + Axillary LND 6 cm ILC with LVI, PNI; 9/16 LN positive with Extracapsular ext ER 100%; PR 100%; Her 2 1.31; Ki 67: 18; pT3a N2a (stage IIIA)    04/17/2011 - 06/19/2011 Chemotherapy    FEC X 4 complicated by febrile neutropenis, spesis and chest wall abscess    10/07/2011 - 11/24/2011 Radiation Therapy    Left chest wall and axilla 5040 cGy in 28 # with Boost    10/16/2011 -  Anti-estrogen oral therapy    Letrozole 2.5 mg po daily    05/27/2015 - 05/30/2015 Hospital Admission    Acute systolic CHF, dilated cardiomyopathy, EF 35-40%    08/09/2015 Surgery    Right lumpectomy: Fibrocystic changes with calcifications, right nipple lesion: Seborrheic keratosis no evidence of malignancy    10/20/2017 Imaging    Elevated LFTs letter ultrasound of the liver which showed liver metastases    11/04/2017 PET scan    Heavy burden of metastatic disease to the liver involving all lobes, index 7.7 centimeters right hepatic lobe mass SUV 16.6, small but hypermetabolic left supraclavicular, left subpectoral, portacaval, aortocaval adenopathy    11/12/2017 Pathology Results    Liver biopsy: Metastatic breast cancer, ER 100%, PR 0%, Ki-67 10%, HER-2 3+ positive    11/19/2017 -  Chemotherapy    Taxotere, Herceptin, Perjeta,   Taxotere tolerated poorly, discontinued, on  Herceptin Perjeta alone     CHIEF COMPLIANT: Follow-up of Herceptin,Perjeta,and Faslodex   INTERVAL HISTORY: [name] is a 78 y.o. with above-mentioned history of metastatic breast cancer is currently onmonthlyHerceptin,Perjeta,and Faslodex. An ECHO on 03/18/18 showed an ejection fraction of 20-25%. She presents to the clinic today after being discharged from hospital for CHF and bronchitis.  Echocardiogram was very concerning.  REVIEW OF SYSTEMS:   Constitutional: Denies fevers, chills or abnormal weight loss Eyes: Denies blurriness of vision Ears, nose, mouth, throat, and face: Denies mucositis or sore throat Respiratory: Denies cough, dyspnea or wheezes Cardiovascular: Denies palpitation, chest discomfort Gastrointestinal: Denies nausea, heartburn or change in bowel habits Skin: Denies abnormal skin rashes Lymphatics: Denies new lymphadenopathy or easy bruising Neurological: Denies numbness, tingling or new weaknesses Behavioral/Psych: Mood is stable, no new changes  Extremities: No lower extremity edema Breast: denies any pain or lumps or nodules in either breasts All other systems were reviewed with the patient and are negative.  I have reviewed the past medical history, past surgical history, social history and family history with the patient and they are unchanged from previous note.  ALLERGIES:  is allergic to cephalexin; clindamycin; oxycodone-acetaminophen; rocephin [ceftriaxone sodium in dextrose]; sulfonamide derivatives; and dilaudid [hydromorphone hcl].  MEDICATIONS:  Current Outpatient Medications  Medication Sig Dispense Refill  . acyclovir (ZOVIRAX) 200 MG capsule TAKE 2 CAPSULES BY MOUTH 3 TIMES DAILY (Patient taking differently: Take 400 mg by mouth 3 (three) times daily. ) 540 capsule  3  . ALPRAZolam (XANAX) 0.5 MG tablet TAKE ONE-HALF TO ONE TABLET BY MOUTH THREE TIMES DAILY AS NEEDED FOR  NERVES (Patient taking differently: Take 0.5 mg by mouth 3  (three) times daily as needed for anxiety. ) 270 tablet 1  . amiodarone (PACERONE) 200 MG tablet Take 1 tablet (200 mg total) by mouth daily. 30 tablet 3  . amoxicillin (AMOXIL) 500 MG capsule Take four capsules one hour before dental appointment. 4 capsule 1  . anastrozole (ARIMIDEX) 1 MG tablet Take 1 tablet (1 mg total) by mouth daily. 30 tablet 2  . bisoprolol (ZEBETA) 5 MG tablet Take 1 tablet (5 mg total) by mouth daily. 30 tablet 0  . Chlorpheniramine-DM (CORICIDIN COUGH/COLD) 4-30 MG TABS Take as directed on the packaging 28 each 0  . cholecalciferol (VITAMIN D) 1000 UNITS tablet Take 1 tablet (1,000 Units total) by mouth daily. 90 tablet 3  . cyanocobalamin 500 MCG tablet Take 1 tablet (500 mcg total) by mouth daily. 90 tablet 3  . furosemide (LASIX) 20 MG tablet Take 1 tablet (20 mg total) by mouth as needed (for swelling). 30 tablet 6  . Insulin Pen Needle (RELION PEN NEEDLE 31G/8MM) 31G X 8 MM MISC 1 Device by Does not apply route 4 (four) times daily. 120 each 11  . ketoconazole (NIZORAL) 2 % cream Apply 1 application topically daily. 15 g 0  . letrozole (FEMARA) 2.5 MG tablet Take 2.5 mg by mouth daily.     Marland Kitchen lidocaine-prilocaine (EMLA) cream Apply to affected area once 30 g 3  . liraglutide (VICTOZA) 18 MG/3ML SOPN Inject 0.3 mLs (1.8 mg total) into the skin daily.    Marland Kitchen LORazepam (ATIVAN) 0.5 MG tablet Take 1 tablet (0.5 mg total) by mouth at bedtime as needed for sleep. 30 tablet 0  . losartan (COZAAR) 25 MG tablet Take 1 tablet (25 mg total) by mouth at bedtime. 30 tablet 6  . magnesium oxide (MAG-OX) 400 MG tablet Take 1 tablet (400 mg total) by mouth daily. 60 tablet 3  . Multiple Vitamins-Minerals (ONE-A-DAY WOMENS 50+ ADVANTAGE) TABS Take 1 tablet by mouth daily. 90 tablet 3  . omeprazole (PRILOSEC) 40 MG capsule Take 40 mg by mouth daily.    . potassium chloride SA (K-DUR,KLOR-CON) 20 MEQ tablet Take 1 tablet (20 mEq total) by mouth as needed (take with lasix as needed). 30  tablet 3  . sertraline (ZOLOFT) 50 MG tablet Take 1 tablet (50 mg total) by mouth daily. 90 tablet 3  . spironolactone (ALDACTONE) 25 MG tablet Take 1 tablet (25 mg total) by mouth daily. 30 tablet 6  . triamcinolone (NASACORT) 55 MCG/ACT AERO nasal inhaler Place 2 sprays into the nose daily. Use daily for five days 1 Inhaler 0   No current facility-administered medications for this visit.     PHYSICAL EXAMINATION: ECOG PERFORMANCE STATUS: 3 - Symptomatic, >50% confined to bed  Vitals:   04/14/18 1217  BP: 115/61  Pulse: 72  Resp: 17  Temp: (!) 97.4 F (36.3 C)  SpO2: 96%   Filed Weights   04/14/18 1217  Weight: 164 lb 8 oz (74.6 kg)    GENERAL: alert, no distress and comfortable SKIN: skin color, texture, turgor are normal, no rashes or significant lesions EYES: normal, Conjunctiva are pink and non-injected, sclera clear OROPHARYNX: no exudate, no erythema and lips, buccal mucosa, and tongue normal  NECK: supple, thyroid normal size, non-tender, without nodularity LYMPH: no palpable lymphadenopathy in the cervical, axillary or  inguinal LUNGS: clear to auscultation and percussion with normal breathing effort HEART: regular rate & rhythm and no murmurs and no lower extremity edema ABDOMEN: abdomen soft, non-tender and normal bowel sounds MUSCULOSKELETAL: no cyanosis of digits and no clubbing  NEURO: alert & oriented x 3 with fluent speech, no focal motor/sensory deficits EXTREMITIES: No lower extremity edema  LABORATORY DATA:  I have reviewed the data as listed CMP Latest Ref Rng & Units 04/14/2018 04/08/2018 03/30/2018  Glucose 70 - 99 mg/dL 94 118(H) 127(H)  BUN 8 - 23 mg/dL '13 13 19  '$ Creatinine 0.44 - 1.00 mg/dL 0.84 1.11(H) 1.13(H)  Sodium 135 - 145 mmol/L 139 134(L) 138  Potassium 3.5 - 5.1 mmol/L 4.2 3.9 4.2  Chloride 98 - 111 mmol/L 105 103 103  CO2 22 - 32 mmol/L '25 24 24  '$ Calcium 8.9 - 10.3 mg/dL 11.1(H) 11.2(H) 10.5(H)  Total Protein 6.5 - 8.1 g/dL 5.9(L) - -   Total Bilirubin 0.3 - 1.2 mg/dL 1.1 - -  Alkaline Phos 38 - 126 U/L 166(H) - -  AST 15 - 41 U/L 90(H) - -  ALT 0 - 44 U/L 51(H) - -    Lab Results  Component Value Date   WBC 4.6 04/14/2018   HGB 14.5 04/14/2018   HCT 45.4 04/14/2018   MCV 95.2 04/14/2018   PLT 220 04/14/2018   NEUTROABS 2.7 04/14/2018    ASSESSMENT & PLAN:  Primary cancer of lower-inner quadrant of left female breast ([place]) Left breast invasive lobular cancer diagnosed 02/20/2011 status post left mastectomy with axillary lymph node dissection, 6 cm ILC with LVI, PNI, 9/16 lymph nodes positive with extracapsular extension, ER 100%, PR 100%, HER-2 -1.31, Ki-67 18%, T3a N2 a M0 stage IIIa status post F AC chemotherapy 4 followed by radiation and currently on letrozole 2.5 mg daily since 10/16/2011  Relapse/recurrence: Elevated LFTs led to ultrasound of the liver which revealed liver metastases September 2019  PET CT scan 11/04/2017: Heavy burden of metastatic disease to the liver involving all lobes, largest 7.7 x 5.4 cm with SUV of 16.6, additional hypermetabolic left supraclavicular, left subpectoral, portacaval and aortocaval lymphadenopathy  Liver biopsy: 11/17/2017: Metastatic breast cancer ER 100%, PR 0%, HER-2 3+ positive CAR[facility] molecular testing 11/12/2017:Revealed PIK3CA mutation, androgen receptor positivity, TMB 7 Mut/Mb  Potential future treatment options: CDK 4 and 6 inhibitors and Alpelisib; as well as other novel anti-HER-2 drugs like Enhertu -------------------------------------------------------------------------------------------------------------------------------------------- Current treatment: .Systemic chemotherapyHerceptin and Perjeta (held Herceptin and Perjeta initiallybecause of low cardiac ejection fractioncleared for use by Dr. Marzetta Board Faslodexstarted 12/17/2017(Taxotere was given [id number] dosediscontinued due to patient's inability to tolerate)  Toxicities: Denies any  adverse effects CT CAP 03/11/2018: Extensive hepatic metastatic disease significantly decreased, small left supraclavicular and borderline enlarged aortocaval lymph nodes and left pectoralis muscle PET CT scan 03/04/2018: Market reduction in hypermetabolic activity of liver metastases some of them resolved, reduction in metabolic activity of the retroperitoneal and periaortic lymph nodes  Hospitalization: For CHF and bronchitis Echo revealed ejection fraction 20 to 25% Based on these findings we cannot continue Herceptin and Perjeta.  Plan: 1.  Continue anastrozole therapy. 2.  I discussed with her that we need to see improvement in her energy levels and strength before adding Ibrance to her treatment.  The reason being Ibrance causes more fatigue as well as neutropenia and risk of infection.  I also discussed with her that if she does not improve her performance status, we would not be able to offer her life  prolonging therapies and we would focus on hospice/palliative care.  Patient has not been eating or drinking much and has been getting progressively weaker.  Today she is in a wheelchair.    No orders of the defined types were placed in this encounter.  The patient has a good understanding of the overall plan. she agrees with it. she will call with any problems that may develop before the next visit here.  Nicholas Lose, MD 04/14/2018  Julious Oka Dorshimer am acting as scribe for Dr. Nicholas Lose.  I have reviewed the above documentation for accuracy and completeness, and I agree with the above.

## 2018-04-14 ENCOUNTER — Other Ambulatory Visit: Payer: Self-pay

## 2018-04-14 ENCOUNTER — Inpatient Hospital Stay: Payer: Medicare HMO | Attending: Hematology and Oncology

## 2018-04-14 ENCOUNTER — Inpatient Hospital Stay: Payer: Medicare HMO

## 2018-04-14 ENCOUNTER — Inpatient Hospital Stay (HOSPITAL_BASED_OUTPATIENT_CLINIC_OR_DEPARTMENT_OTHER): Payer: Medicare HMO | Admitting: Hematology and Oncology

## 2018-04-14 ENCOUNTER — Encounter: Payer: Self-pay | Admitting: Hematology and Oncology

## 2018-04-14 DIAGNOSIS — Z79811 Long term (current) use of aromatase inhibitors: Secondary | ICD-10-CM

## 2018-04-14 DIAGNOSIS — Z170 Estrogen receptor positive status [ER+]: Secondary | ICD-10-CM | POA: Insufficient documentation

## 2018-04-14 DIAGNOSIS — Z9012 Acquired absence of left breast and nipple: Secondary | ICD-10-CM

## 2018-04-14 DIAGNOSIS — C50312 Malignant neoplasm of lower-inner quadrant of left female breast: Secondary | ICD-10-CM

## 2018-04-14 DIAGNOSIS — C787 Secondary malignant neoplasm of liver and intrahepatic bile duct: Secondary | ICD-10-CM

## 2018-04-14 DIAGNOSIS — Z923 Personal history of irradiation: Secondary | ICD-10-CM

## 2018-04-14 DIAGNOSIS — M8580 Other specified disorders of bone density and structure, unspecified site: Secondary | ICD-10-CM

## 2018-04-14 DIAGNOSIS — Z79899 Other long term (current) drug therapy: Secondary | ICD-10-CM | POA: Diagnosis not present

## 2018-04-14 DIAGNOSIS — Z9221 Personal history of antineoplastic chemotherapy: Secondary | ICD-10-CM | POA: Diagnosis not present

## 2018-04-14 DIAGNOSIS — Z794 Long term (current) use of insulin: Secondary | ICD-10-CM | POA: Insufficient documentation

## 2018-04-14 DIAGNOSIS — Z95828 Presence of other vascular implants and grafts: Secondary | ICD-10-CM

## 2018-04-14 LAB — CBC WITH DIFFERENTIAL (CANCER CENTER ONLY)
Abs Immature Granulocytes: 0 10*3/uL (ref 0.00–0.07)
Basophils Absolute: 0.1 10*3/uL (ref 0.0–0.1)
Basophils Relative: 2 %
Eosinophils Absolute: 0.1 10*3/uL (ref 0.0–0.5)
Eosinophils Relative: 3 %
HCT: 45.4 % (ref 36.0–46.0)
Hemoglobin: 14.5 g/dL (ref 12.0–15.0)
Immature Granulocytes: 0 %
Lymphocytes Relative: 24 %
Lymphs Abs: 1.1 10*3/uL (ref 0.7–4.0)
MCH: 30.4 pg (ref 26.0–34.0)
MCHC: 31.9 g/dL (ref 30.0–36.0)
MCV: 95.2 fL (ref 80.0–100.0)
Monocytes Absolute: 0.6 10*3/uL (ref 0.1–1.0)
Monocytes Relative: 12 %
Neutro Abs: 2.7 10*3/uL (ref 1.7–7.7)
Neutrophils Relative %: 59 %
Platelet Count: 220 10*3/uL (ref 150–400)
RBC: 4.77 MIL/uL (ref 3.87–5.11)
RDW: 16.2 % — ABNORMAL HIGH (ref 11.5–15.5)
WBC: 4.6 10*3/uL (ref 4.0–10.5)
nRBC: 0 % (ref 0.0–0.2)

## 2018-04-14 LAB — CMP (CANCER CENTER ONLY)
ALT: 51 U/L — ABNORMAL HIGH (ref 0–44)
AST: 90 U/L — ABNORMAL HIGH (ref 15–41)
Albumin: 2.5 g/dL — ABNORMAL LOW (ref 3.5–5.0)
Alkaline Phosphatase: 166 U/L — ABNORMAL HIGH (ref 38–126)
Anion gap: 9 (ref 5–15)
BUN: 13 mg/dL (ref 8–23)
CO2: 25 mmol/L (ref 22–32)
Calcium: 11.1 mg/dL — ABNORMAL HIGH (ref 8.9–10.3)
Chloride: 105 mmol/L (ref 98–111)
Creatinine: 0.84 mg/dL (ref 0.44–1.00)
GFR, Est AFR Am: >60 mL/min (ref >60)
GFR, Estimated: >60 mL/min (ref >60)
Glucose, Bld: 94 mg/dL (ref 70–99)
Potassium: 4.2 mmol/L (ref 3.5–5.1)
Sodium: 139 mmol/L (ref 135–145)
Total Bilirubin: 1.1 mg/dL (ref 0.3–1.2)
Total Protein: 5.9 g/dL — ABNORMAL LOW (ref 6.5–8.1)

## 2018-04-14 MED ORDER — SODIUM CHLORIDE 0.9% FLUSH
10.0000 mL | Freq: Once | INTRAVENOUS | Status: AC
  Administered 2018-04-14: 10 mL
  Filled 2018-04-14: qty 10

## 2018-04-14 NOTE — Assessment & Plan Note (Signed)
Left breast invasive lobular cancer diagnosed 02/20/2011 status post left mastectomy with axillary lymph node dissection, 6 cm ILC with LVI, PNI, 9/16 lymph nodes positive with extracapsular extension, ER 100%, PR 100%, HER-2 -1.31, Ki-67 18%, T3a N2 a M0 stage IIIa status post F AC chemotherapy 4 followed by radiation and currently on letrozole 2.5 mg daily since 10/16/2011  Relapse/recurrence: Elevated LFTs led to ultrasound of the liver which revealed liver metastases September 2019  PET CT scan 11/04/2017: Heavy burden of metastatic disease to the liver involving all lobes, largest 7.7 x 5.4 cm with SUV of 16.6, additional hypermetabolic left supraclavicular, left subpectoral, portacaval and aortocaval lymphadenopathy  Liver biopsy: 11/17/2017: Metastatic breast cancer ER 100%, PR 0%, HER-2 3+ positive CARIS molecular testing 11/12/2017:Revealed PIK3CA mutation, androgen receptor positivity, TMB 7 Mut/Mb  Potential future treatment options: CDK 4 and 6 inhibitors and Alpelisib; as well as other novel anti-HER-2 drugs like Enhertu -------------------------------------------------------------------------------------------------------------------------------------------- Current treatment: .Systemic chemotherapyHerceptin and Perjeta (held Herceptin and Perjeta initiallybecause of low cardiac ejection fractioncleared for use by Dr. Marzetta Board Faslodexstarted 12/17/2017(Taxotere was given 1025 [id number] dosediscontinued due to patient's inability to tolerate)  Toxicities: Denies any adverse effects CT CAP 03/11/2018: Extensive hepatic metastatic disease significantly decreased, small left supraclavicular and borderline enlarged aortocaval lymph nodes and left pectoralis muscle PET CT scan 03/04/2018: Market reduction in hypermetabolic activity of liver metastases some of them resolved, reduction in metabolic activity of the retroperitoneal and periaortic lymph nodes  Hospitalization: For  CHF and bronchitis Echo revealed ejection fraction 20 to 25% Based on these findings we cannot continue Herceptin and Perjeta.  Plan: 1.  Continue anastrozole therapy. 2.  I discussed with her that we need to see improvement in her energy levels and strength before adding Ibrance to her treatment.  The reason being Ibrance causes more fatigue as well as neutropenia and risk of infection.  I also discussed with her that if she does not improve her performance status, we would not be able to offer her life prolonging therapies and we would focus on hospice/palliative care.  Patient has not been eating or drinking much and has been getting progressively weaker.  Today she is in a wheelchair.

## 2018-04-15 ENCOUNTER — Ambulatory Visit (HOSPITAL_COMMUNITY)
Admission: RE | Admit: 2018-04-15 | Discharge: 2018-04-15 | Disposition: A | Discharge status: Home / Self Care | Payer: Medicare HMO | Source: Ambulatory Visit | Attending: Cardiology | Admitting: Cardiology

## 2018-04-15 DIAGNOSIS — I5042 Chronic combined systolic (congestive) and diastolic (congestive) heart failure: Secondary | ICD-10-CM | POA: Insufficient documentation

## 2018-04-15 LAB — BASIC METABOLIC PANEL
Anion gap: 9 (ref 5–15)
BUN: 11 mg/dL (ref 8–23)
CHLORIDE: 105 mmol/L (ref 98–111)
CO2: 26 mmol/L (ref 22–32)
Calcium: 11.5 mg/dL — ABNORMAL HIGH (ref 8.9–10.3)
Creatinine, Ser: 1.01 mg/dL — ABNORMAL HIGH (ref 0.44–1.00)
GFR calc Af Amer: >60 mL/min (ref >60)
GFR calc non Af Amer: 54 mL/min — ABNORMAL LOW (ref >60)
Glucose, Bld: 108 mg/dL — ABNORMAL HIGH (ref 70–99)
Potassium: 4.2 mmol/L (ref 3.5–5.1)
Sodium: 140 mmol/L (ref 135–145)

## 2018-04-26 ENCOUNTER — Ambulatory Visit: Payer: Self-pay

## 2018-04-28 ENCOUNTER — Telehealth: Payer: Self-pay | Admitting: *Deleted

## 2018-04-28 ENCOUNTER — Telehealth: Payer: Self-pay | Admitting: Hematology and Oncology

## 2018-04-28 ENCOUNTER — Ambulatory Visit: Payer: Medicare HMO | Admitting: Hematology and Oncology

## 2018-04-28 ENCOUNTER — Other Ambulatory Visit: Payer: Self-pay | Admitting: *Deleted

## 2018-04-28 ENCOUNTER — Encounter: Payer: Self-pay | Admitting: *Deleted

## 2018-04-28 DIAGNOSIS — C50919 Malignant neoplasm of unspecified site of unspecified female breast: Secondary | ICD-10-CM

## 2018-04-28 NOTE — Telephone Encounter (Signed)
I discussed with the patient's daughter-in-law [name] at length about the progressive worsening of performance status of [name].  She is now completely confused.  We called [name] as well and she did not make any sense with our discussions. Based on our previous conversations she is DNR. We will get hospice care to assist the patient and her family towards end of life.

## 2018-04-28 NOTE — Progress Notes (Signed)
error 

## 2018-04-28 NOTE — Telephone Encounter (Signed)
Received a call from [employer], pts daughter in law.  Cindi states that the pt has had extreme weakness and confusion with slurring her words, has not been eating or drinking and the pt is sleeping about 90% of the day.  The family states that the pt has not started taking ibrance and is wanting to bring in hospice.  I also called the pt to check in on her and see how she if feeling and she was very confused over the phone, I tried to talk to her about brining in hospice care and the patient stated she did want to stay home as long as possible and wanted to know when the hospice nurse would come out to see her.  The pt stated that it would be okay to talk with Cindi her daughter in law as well. I told the pt that I would talk with Dr. Lindi Adie and get back to her and cindi later today.

## 2018-04-28 NOTE — Progress Notes (Signed)
Referral for hospice faxed to [facility] care in [place] [phone]).  Pt daughter in law Cindi Rathbone notified of referral sent.

## 2018-05-10 ENCOUNTER — Telehealth (HOSPITAL_COMMUNITY): Payer: Self-pay | Admitting: Vascular Surgery

## 2018-05-10 NOTE — Telephone Encounter (Signed)
Left pt message giving pt rescheduled echo appt on April  14 @ 11am @ [facility]. PT was scheduled orginally @ [facility] clinic 05/17/18 w/ echo and 6-8 week brst cancer f/u w/ DB. Appt w/ DB will be a televisit after echo @ [facility] 4/14 due to Covid, asked pt to call office to confirm echo appt at [facility], and to make Telephone visit when she calls back .

## 2018-05-12 ENCOUNTER — Other Ambulatory Visit: Payer: Self-pay

## 2018-05-12 ENCOUNTER — Ambulatory Visit: Payer: Self-pay

## 2018-05-12 ENCOUNTER — Ambulatory Visit: Payer: Self-pay | Admitting: Adult Health

## 2018-05-17 ENCOUNTER — Inpatient Hospital Stay (HOSPITAL_COMMUNITY): Admission: RE | Admit: 2018-05-17 | Payer: Medicare HMO | Source: Ambulatory Visit

## 2018-05-17 ENCOUNTER — Ambulatory Visit (HOSPITAL_COMMUNITY): Payer: Medicare HMO

## 2018-05-17 ENCOUNTER — Encounter (HOSPITAL_COMMUNITY): Payer: Self-pay | Admitting: Internal Medicine

## 2018-05-27 ENCOUNTER — Ambulatory Visit: Payer: Self-pay

## 2018-06-03 DEATH — deceased

## 2019-07-18 IMAGING — CT CT CHEST W/ CM
2 of 5 series · 12 of 36 positions shown, 15 images · IV contrast (OMNIPAQUE)
Comparison: Multiple exams, including PET-CT from 03/04/2018

CLINICAL DATA: Metastatic breast cancer restaging. Ongoing
chemotherapy.

EXAM:
CT CHEST, ABDOMEN, AND PELVIS WITH CONTRAST
TECHNIQUE: Multidetector CT imaging of the chest, abdomen and pelvis was
performed following the standard protocol during bolus
administration of intravenous contrast.
CONTRAST:  100mL OMNIPAQUE IOHEXOL 300 MG/ML  SOLN

[Series 2: cap with · axial · 0.81mm/px · z∈[-585,-65]mm · 9 of 131 slices shown, 12 images]
[im 14/131  mediastinal]
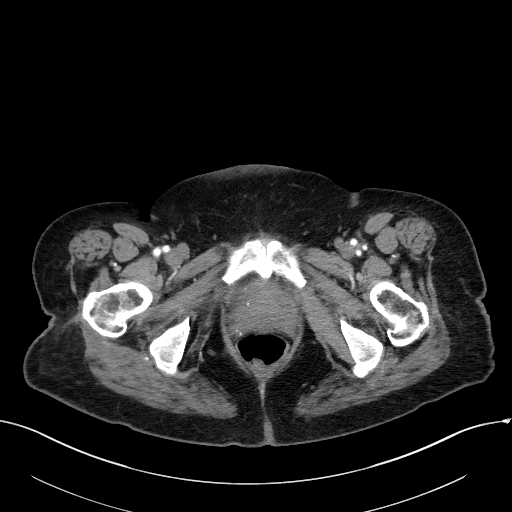
[im 14/131  lung]
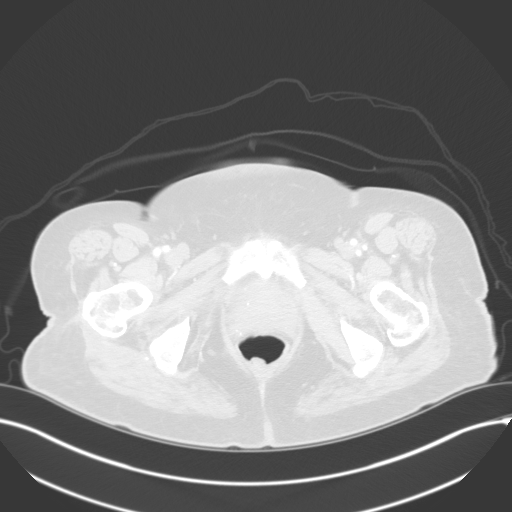
[im 27/131  lung]
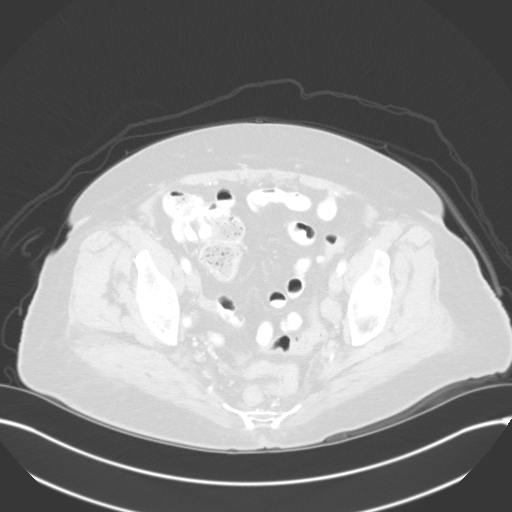
[im 40/131  lung]
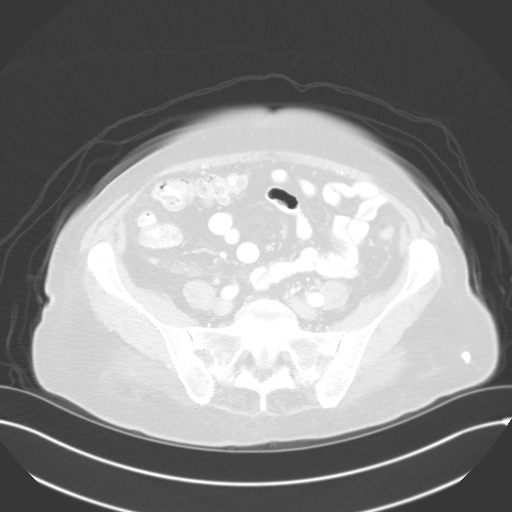
[im 53/131  lung]
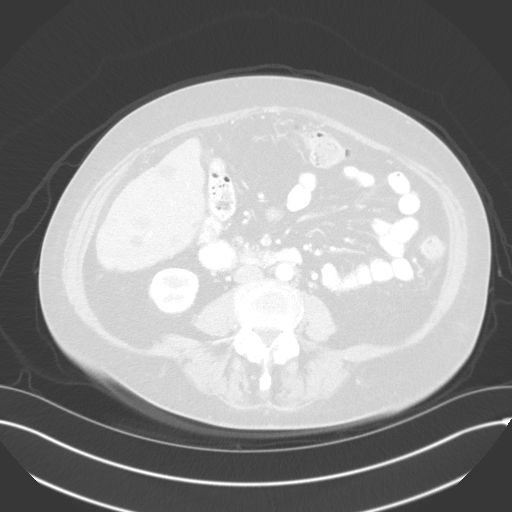
[im 66/131  mediastinal]
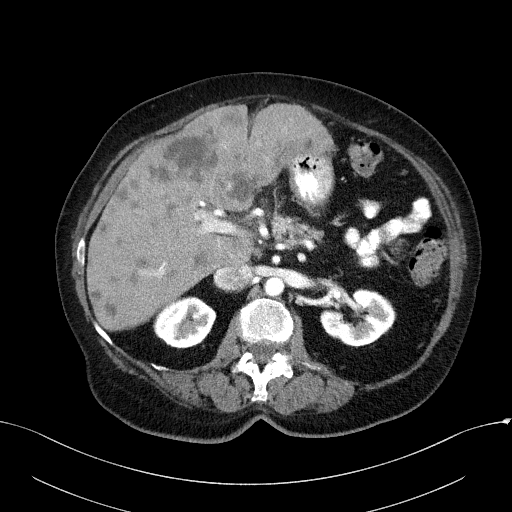
[im 66/131  lung]
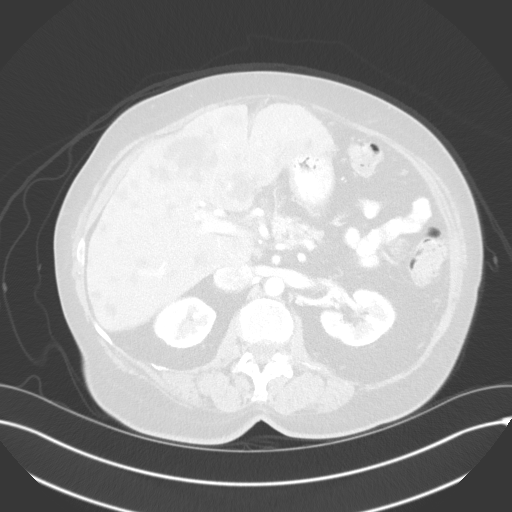
[im 79/131  lung]
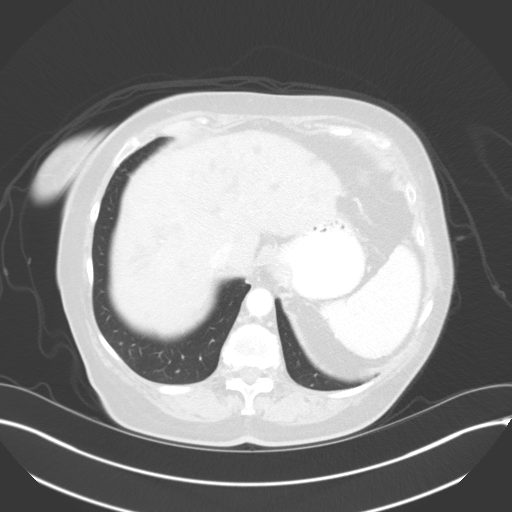
[im 92/131  lung]
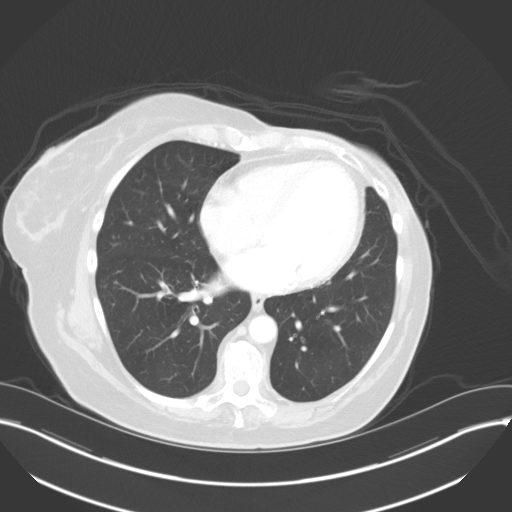
[im 105/131  lung]
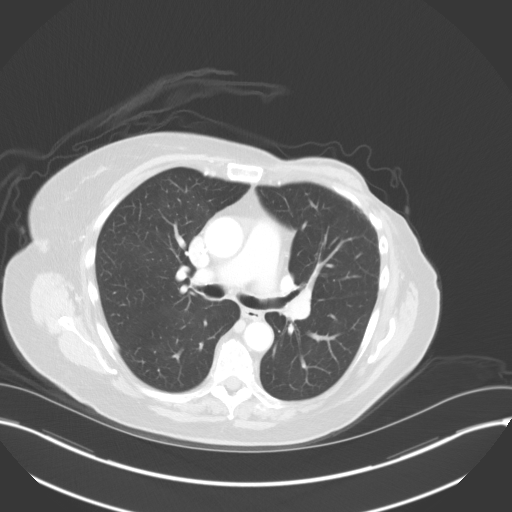
[im 118/131  mediastinal]
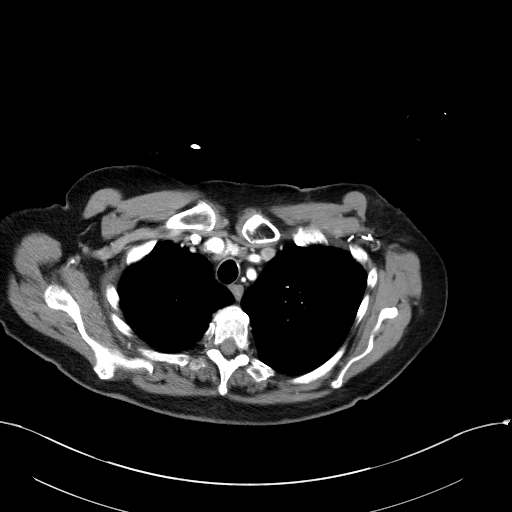
[im 118/131  lung]
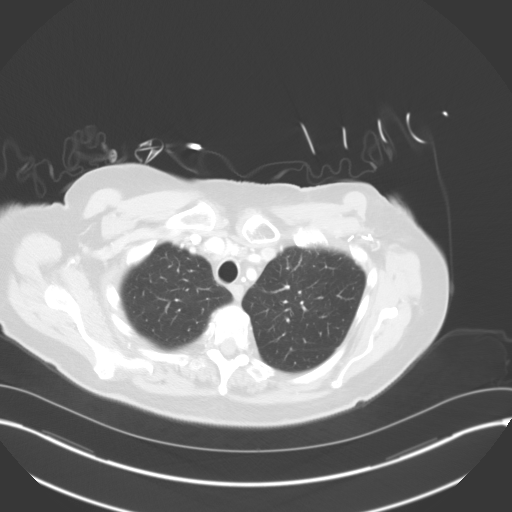

[Series 5: coronals · coronal · 0.82mm/px · 3 of 171 slices shown]
[im 35/171  lung]
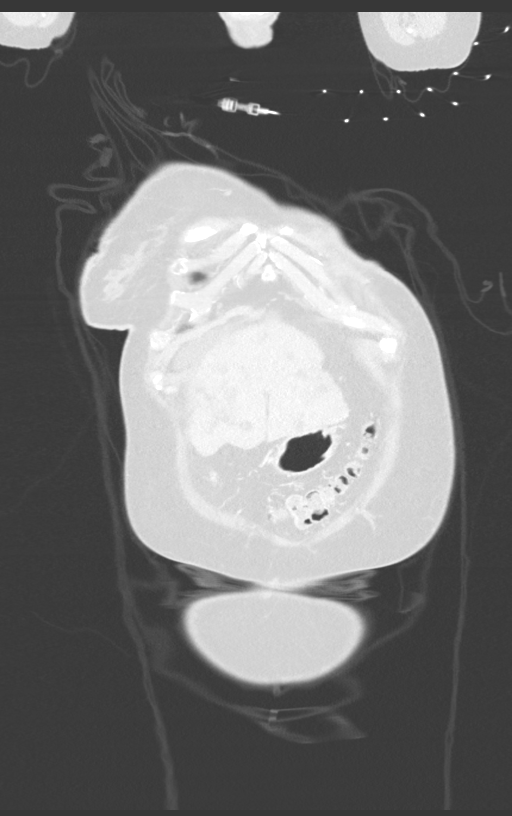
[im 69/171  lung]
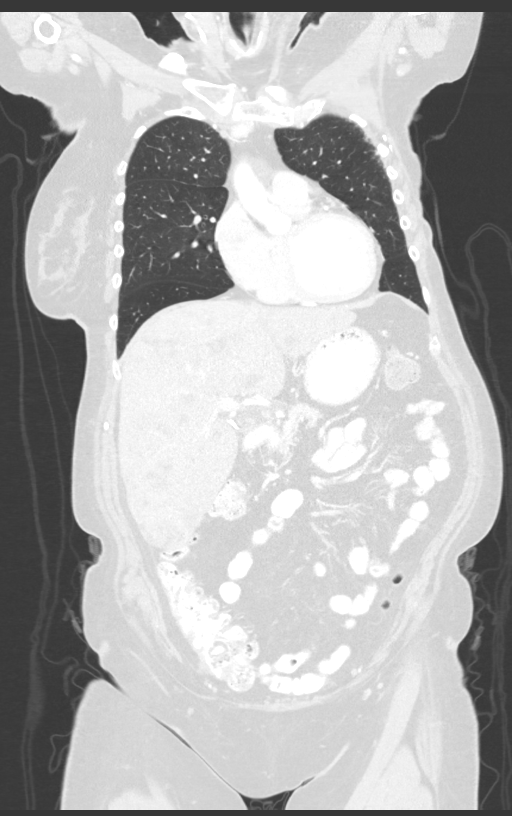
[im 103/171  lung]
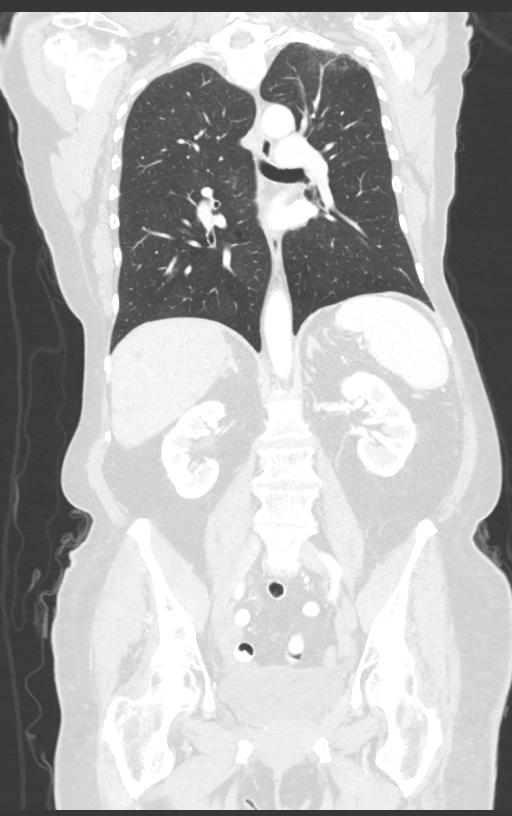

[12 of 36 positions shown; findings below may reference images not displayed]

FINDINGS: CT CHEST FINDINGS

Cardiovascular: Right Port-A-Cath tip: Lower SVC. Mild calcification
along the aortic valve.

Mediastinum/Nodes: 5 mm hypodense lesion of the right thyroid lobe.

Of the previously hypermetabolic supraclavicular lymph nodes on the
left measures 0.6 cm in short axis on image [DATE]. The other lesion is
further caudad along the medial margin of the pectoralis minor and
is difficult to separately identify and measure, but probably in the
vicinity of image [DATE].

Lungs/Pleura: Mild ground-glass opacity at the left lung apex. Post
radiation therapy related findings anteriorly in the left upper
lobe.

Musculoskeletal: Degenerative glenohumeral arthropathy bilaterally.
Thoracic spondylosis. T9 lucent lesion is stable from 8821 and most
compatible with hemangioma.

CT ABDOMEN PELVIS FINDINGS

Hepatobiliary: What is bread distribution of hypodense lesions
throughout the liver, some are irregular and geographic, others are
rounded. All recent comparison exams are PET-CT examinations, and
clearly the PET-CT examination from just 1 week ago is similar. Many
of the lesions are fairly hypodense suggesting some degree of
internal necrosis. In index rounded lesion measures 1.7 by 1.1 cm on
image 79/2; an index more geographic lesion measures 5.5 by 3.6 cm
in the lateral segment left hepatic lobe on image 61/2.
Cholecystectomy. No portal vein thrombosis.

Pancreas: Unremarkable

Spleen: Unremarkable

Adrenals/Urinary Tract: Non-specific nodularity of both adrenal
glands, with recent low-grade metabolic activity of the right
adrenal gland. The kidneys appear unremarkable.

Stomach/Bowel: Unremarkable

Vascular/Lymphatic: Aortocaval node 1.0 cm in short axis on image
73/2, previously mildly hypermetabolic with maximum SUV 5.3. No
other adenopathy identified. Aortoiliac atherosclerotic vascular
disease.

Reproductive: Uterus absent.  Adnexa unremarkable.

Other: Minimal pelvic ascites, cause uncertain.

Musculoskeletal: Degenerative disc disease at L4-5.
IMPRESSION: 1. Extensive hepatic metastatic burden although as noted on recent
PET-CT this has significantly decreased in metabolic activity
suggesting a response to therapy.
2. Small left supraclavicular and borderline enlarged aortocaval
lymph nodes were recently hypermetabolic and likely malignant. There
is also a small focus of accentuated metabolic activity along the
left pectoralis muscle which is not well correlated on today's
diagnostic CT.
3. Ground-glass opacity at the left lung apex possibly from mild
alveolitis.
4.  Aortic Atherosclerosis ([id number]).
5. Small bilateral adrenal nodules are nonspecific on CT today.
These merit surveillance.
6. Minimal pelvic ascites, cause uncertain.

## 2019-08-06 IMAGING — MR MR CARD MORPHOLOGY WO/W CM
13 of 15 series · 38 of 40 positions shown · IV contrast (Gadavist)
Comparison: none

CLINICAL DATA: Cardiomyopathy of uncertain etiology.

EXAM:
CARDIAC MRI
TECHNIQUE: The patient was scanned on a 1.5 Tesla GE magnet. A dedicated
cardiac coil was used. Functional imaging was done using Fiesta
sequences. [DATE], and 4 chamber views were done to assess for RWMA's.
Modified Uvaldo rule using a short axis stack was used to
calculate an ejection fraction on a dedicated work station using
Circle software. The patient received 7 cc of Gadavist. After 10
minutes inversion recovery sequences were used to assess for
infiltration and scar tissue.

[Series 7: bSSFP · coronal · 8.0mm · 1.56mm/px · 25 of 350 slices shown (1 of 4)]
[im 1/350]
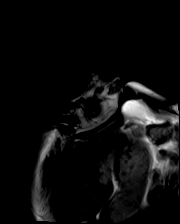
[im 15/350]
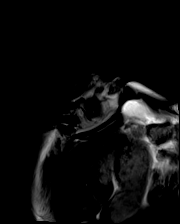
[im 30/350]
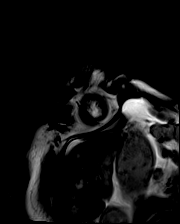
[im 44/350]
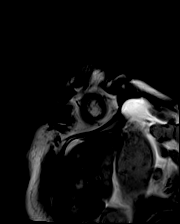
[im 59/350]
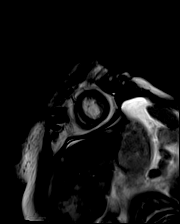
[im 73/350]
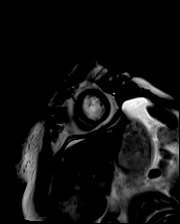
[im 88/350]
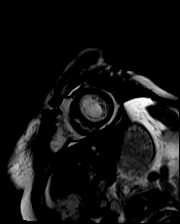
[im 102/350]
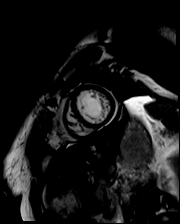
[im 117/350]
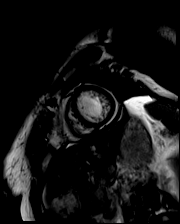
[im 131/350]
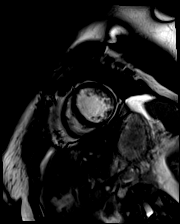
[im 146/350]
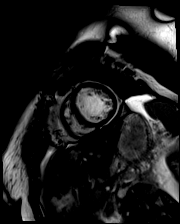
[im 160/350]
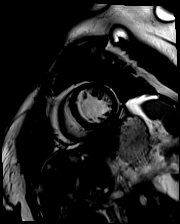
[im 175/350]
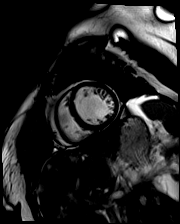
[im 190/350]
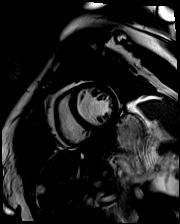
[im 204/350]
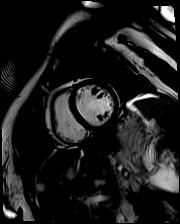
[im 219/350]
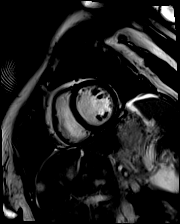
[im 233/350]
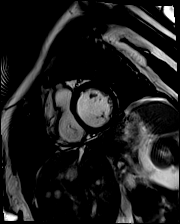
[im 248/350]
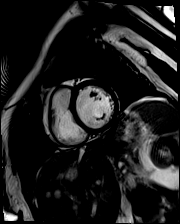
[im 262/350]
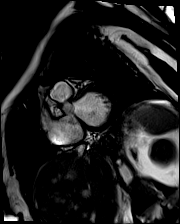
[im 277/350]
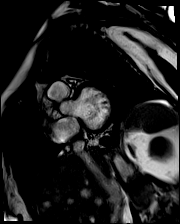
[im 291/350]
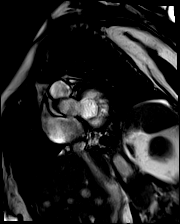
[im 306/350]
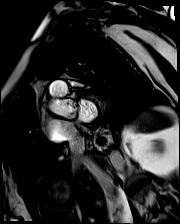
[im 320/350]
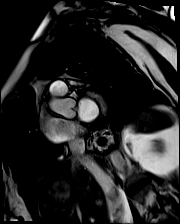
[im 335/350]
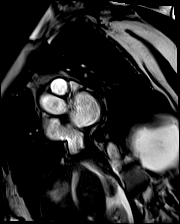
[im 350/350]
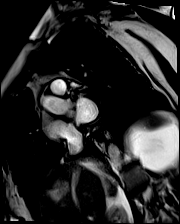

[Series 9: t2_stir_db_sax · coronal · 8.0mm · 1.83mm/px · 1 of 14 slices shown]
[im 1/14]
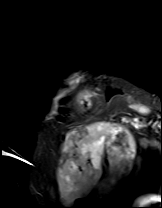

[Series 10: bSSFP · axial · 6.0mm · 1.41mm/px · 1 of 25 slices shown (2 of 4)]
[im 1/25]
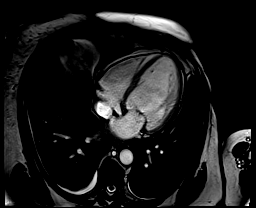

[Series 11: bSSFP · oblique · 6.0mm · 1.41mm/px · 1 of 25 slices shown (3 of 4)]
[im 1/25]
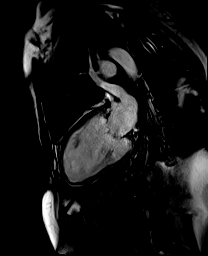

[Series 12: bSSFP · oblique · 6.0mm · 1.41mm/px · 2 of 25 slices shown (4 of 4)]
[im 1/25]
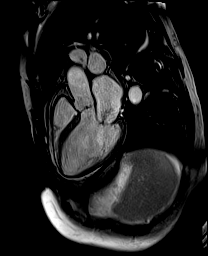
[im 25/25]
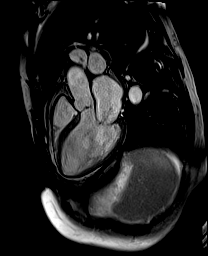

[Series 14: lge_single shot sa · oblique · 8.0mm · 1.88mm/px · 1 of 16 slices shown (1 of 2)]
[im 1/16]
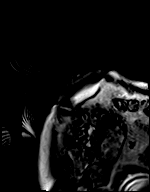

[Series 15: lge_single shot sa · oblique · 8.0mm · 1.88mm/px · 1 of 16 slices shown (2 of 2)]
[im 1/16]
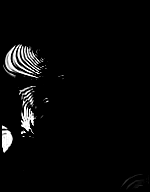

[Series 16: lge_single shot radial_mag · axial · 6.0mm · 1.98mm/px · 1 of 1 slices shown]
[im 1/1]
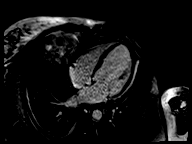

[Series 17: lge_single shot radial_psir · axial · 6.0mm · 1.98mm/px · 1 of 1 slices shown]
[im 1/1]
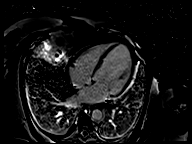

[Series 23: lge short axis_mag · oblique · 8.0mm · 1.61mm/px · 1 of 16 slices shown]
[im 1/16]
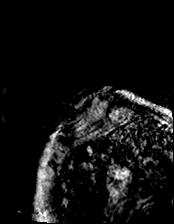

[Series 24: lge short axis_psir · oblique · 8.0mm · 1.61mm/px · 1 of 16 slices shown]
[im 1/16]
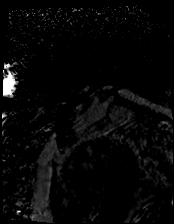

[Series 25: lge radial ((date)ch)_mag · axial · 6.0mm · 1.61mm/px · 1 of 1 slices shown]
[im 1/1]
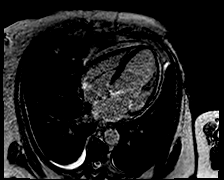

[Series 26: lge radial ((date)ch)_psir · axial · 6.0mm · 1.61mm/px · 1 of 1 slices shown]
[im 1/1]
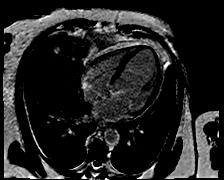

[38 of 40 positions shown; findings below may reference images not displayed]

FINDINGS: Limited images of the lung fields showed no gross abnormalities.

Mildly dilated left ventricle with normal wall thickness. Diffuse
hypokinesis with EF 25%. Normal right ventricular size and systolic
function, EF 45%. Mild left atrial enlargement, normal right atrial
size. Mild mitral regurgitation. Trileaflet aortic valve with no
significant regurgitation or stenosis.

On delayed enhancement imaging, there was a small area of mid-wall
late gadolinium enhancement (LGE) at the basal anteroseptal RV
insertion site.

Measurements:

LVEDV 224 mL

LVSV 56 mL

LVEF 25%

RVEDV 127 mL

RVSV 57 mL

RVEF 45%
IMPRESSION: 1.  Mildly dilated left ventricle with EF 25%, diffuse hypokinesis.

2.  Normal RV size and systolic function, EF 45%.

3. Nonspecific basal anteroseptal RV insertion site LGE, this can be
due to pressure/volume overload.

Md Riduan Marium
